# Patient Record
Sex: Female | Born: 1940 | Race: White | Hispanic: No | Marital: Married | Smoking: Former smoker
Health system: Southern US, Community
[De-identification: ages and names within clinical notes are randomized; demographics above are authoritative.]

## PROBLEM LIST (undated history)

## (undated) DIAGNOSIS — K279 Peptic ulcer, site unspecified, unspecified as acute or chronic, without hemorrhage or perforation: Secondary | ICD-10-CM

## (undated) DIAGNOSIS — E785 Hyperlipidemia, unspecified: Secondary | ICD-10-CM

## (undated) DIAGNOSIS — Z8719 Personal history of other diseases of the digestive system: Secondary | ICD-10-CM

## (undated) DIAGNOSIS — R32 Unspecified urinary incontinence: Secondary | ICD-10-CM

## (undated) DIAGNOSIS — Z8679 Personal history of other diseases of the circulatory system: Secondary | ICD-10-CM

## (undated) DIAGNOSIS — F419 Anxiety disorder, unspecified: Secondary | ICD-10-CM

## (undated) DIAGNOSIS — F32A Depression, unspecified: Secondary | ICD-10-CM

## (undated) DIAGNOSIS — T8859XA Other complications of anesthesia, initial encounter: Secondary | ICD-10-CM

## (undated) DIAGNOSIS — I272 Pulmonary hypertension, unspecified: Secondary | ICD-10-CM

## (undated) DIAGNOSIS — M1990 Unspecified osteoarthritis, unspecified site: Secondary | ICD-10-CM

## (undated) DIAGNOSIS — I10 Essential (primary) hypertension: Secondary | ICD-10-CM

## (undated) DIAGNOSIS — Z9981 Dependence on supplemental oxygen: Secondary | ICD-10-CM

## (undated) DIAGNOSIS — K219 Gastro-esophageal reflux disease without esophagitis: Secondary | ICD-10-CM

## (undated) DIAGNOSIS — I2510 Atherosclerotic heart disease of native coronary artery without angina pectoris: Secondary | ICD-10-CM

## (undated) DIAGNOSIS — T4145XA Adverse effect of unspecified anesthetic, initial encounter: Secondary | ICD-10-CM

## (undated) DIAGNOSIS — R0600 Dyspnea, unspecified: Secondary | ICD-10-CM

## (undated) DIAGNOSIS — I8390 Asymptomatic varicose veins of unspecified lower extremity: Secondary | ICD-10-CM

## (undated) DIAGNOSIS — E041 Nontoxic single thyroid nodule: Secondary | ICD-10-CM

## (undated) DIAGNOSIS — J449 Chronic obstructive pulmonary disease, unspecified: Secondary | ICD-10-CM

## (undated) DIAGNOSIS — K1121 Acute sialoadenitis: Secondary | ICD-10-CM

## (undated) DIAGNOSIS — F329 Major depressive disorder, single episode, unspecified: Secondary | ICD-10-CM

## (undated) DIAGNOSIS — G4730 Sleep apnea, unspecified: Secondary | ICD-10-CM

## (undated) DIAGNOSIS — B9620 Unspecified Escherichia coli [E. coli] as the cause of diseases classified elsewhere: Secondary | ICD-10-CM

## (undated) DIAGNOSIS — I509 Heart failure, unspecified: Secondary | ICD-10-CM

## (undated) DIAGNOSIS — M7550 Bursitis of unspecified shoulder: Secondary | ICD-10-CM

## (undated) DIAGNOSIS — R7881 Bacteremia: Secondary | ICD-10-CM

## (undated) HISTORY — DX: Hyperlipidemia, unspecified: E78.5

## (undated) HISTORY — DX: Atherosclerotic heart disease of native coronary artery without angina pectoris: I25.10

## (undated) HISTORY — PX: BILATERAL SALPINGOOPHORECTOMY: SHX1223

## (undated) HISTORY — DX: Heart failure, unspecified: I50.9

## (undated) HISTORY — DX: Peptic ulcer, site unspecified, unspecified as acute or chronic, without hemorrhage or perforation: K27.9

## (undated) HISTORY — DX: Personal history of other diseases of the circulatory system: Z86.79

## (undated) HISTORY — DX: Nontoxic single thyroid nodule: E04.1

## (undated) HISTORY — DX: Unspecified osteoarthritis, unspecified site: M19.90

## (undated) HISTORY — PX: INCONTINENCE SURGERY: SHX676

## (undated) HISTORY — DX: Chronic obstructive pulmonary disease, unspecified: J44.9

## (undated) HISTORY — DX: Pulmonary hypertension, unspecified: I27.20

## (undated) HISTORY — PX: CARDIAC CATHETERIZATION: SHX172

## (undated) HISTORY — PX: TUBAL LIGATION: SHX77

## (undated) HISTORY — DX: Unspecified urinary incontinence: R32

## (undated) HISTORY — DX: Sleep apnea, unspecified: G47.30

---

## 1988-07-25 HISTORY — PX: TOTAL ABDOMINAL HYSTERECTOMY: SHX209

## 1997-12-08 ENCOUNTER — Other Ambulatory Visit: Admission: RE | Admit: 1997-12-08 | Discharge: 1997-12-08 | Payer: Self-pay | Admitting: Obstetrics and Gynecology

## 1998-12-21 ENCOUNTER — Other Ambulatory Visit: Admission: RE | Admit: 1998-12-21 | Discharge: 1998-12-21 | Payer: Self-pay | Admitting: Obstetrics and Gynecology

## 1999-03-12 ENCOUNTER — Encounter: Payer: Self-pay | Admitting: Urology

## 1999-03-16 ENCOUNTER — Observation Stay (HOSPITAL_COMMUNITY): Admission: RE | Admit: 1999-03-16 | Discharge: 1999-03-17 | Payer: Self-pay | Admitting: Urology

## 1999-07-26 DIAGNOSIS — I2510 Atherosclerotic heart disease of native coronary artery without angina pectoris: Secondary | ICD-10-CM

## 1999-07-26 HISTORY — DX: Atherosclerotic heart disease of native coronary artery without angina pectoris: I25.10

## 2000-01-31 ENCOUNTER — Other Ambulatory Visit: Admission: RE | Admit: 2000-01-31 | Discharge: 2000-01-31 | Payer: Self-pay | Admitting: Obstetrics and Gynecology

## 2000-06-13 ENCOUNTER — Inpatient Hospital Stay (HOSPITAL_COMMUNITY): Admission: EM | Admit: 2000-06-13 | Discharge: 2000-06-29 | Payer: Self-pay | Admitting: Internal Medicine

## 2000-06-13 ENCOUNTER — Encounter: Payer: Self-pay | Admitting: Emergency Medicine

## 2000-06-15 ENCOUNTER — Encounter: Payer: Self-pay | Admitting: Interventional Cardiology

## 2000-06-21 ENCOUNTER — Encounter: Payer: Self-pay | Admitting: Thoracic Surgery (Cardiothoracic Vascular Surgery)

## 2000-06-21 HISTORY — PX: CORONARY ARTERY BYPASS GRAFT: SHX141

## 2000-06-22 ENCOUNTER — Encounter: Payer: Self-pay | Admitting: Thoracic Surgery (Cardiothoracic Vascular Surgery)

## 2000-06-23 ENCOUNTER — Encounter: Payer: Self-pay | Admitting: Thoracic Surgery (Cardiothoracic Vascular Surgery)

## 2000-06-27 ENCOUNTER — Encounter: Payer: Self-pay | Admitting: Thoracic Surgery (Cardiothoracic Vascular Surgery)

## 2000-07-14 ENCOUNTER — Encounter: Admission: RE | Admit: 2000-07-14 | Discharge: 2000-07-14 | Payer: Self-pay | Admitting: Cardiology

## 2000-07-29 ENCOUNTER — Encounter (HOSPITAL_COMMUNITY): Admission: RE | Admit: 2000-07-29 | Discharge: 2000-10-27 | Payer: Self-pay | Admitting: Interventional Cardiology

## 2000-08-14 ENCOUNTER — Encounter
Admission: RE | Admit: 2000-08-14 | Discharge: 2000-08-14 | Payer: Self-pay | Admitting: Thoracic Surgery (Cardiothoracic Vascular Surgery)

## 2000-08-14 ENCOUNTER — Encounter: Payer: Self-pay | Admitting: Thoracic Surgery (Cardiothoracic Vascular Surgery)

## 2001-03-21 ENCOUNTER — Other Ambulatory Visit: Admission: RE | Admit: 2001-03-21 | Discharge: 2001-03-21 | Payer: Self-pay | Admitting: Obstetrics and Gynecology

## 2001-08-08 ENCOUNTER — Encounter: Admission: RE | Admit: 2001-08-08 | Discharge: 2001-08-08 | Payer: Self-pay | Admitting: Family Medicine

## 2001-08-08 ENCOUNTER — Encounter: Payer: Self-pay | Admitting: Family Medicine

## 2001-08-22 ENCOUNTER — Encounter: Payer: Self-pay | Admitting: Family Medicine

## 2001-08-22 ENCOUNTER — Encounter: Admission: RE | Admit: 2001-08-22 | Discharge: 2001-08-22 | Payer: Self-pay | Admitting: Family Medicine

## 2002-01-08 ENCOUNTER — Emergency Department (HOSPITAL_COMMUNITY): Admission: EM | Admit: 2002-01-08 | Discharge: 2002-01-08 | Payer: Self-pay | Admitting: Emergency Medicine

## 2002-01-08 ENCOUNTER — Encounter: Payer: Self-pay | Admitting: Emergency Medicine

## 2002-01-18 ENCOUNTER — Encounter: Admission: RE | Admit: 2002-01-18 | Discharge: 2002-01-18 | Payer: Self-pay | Admitting: Cardiology

## 2002-03-18 ENCOUNTER — Other Ambulatory Visit: Admission: RE | Admit: 2002-03-18 | Discharge: 2002-03-18 | Payer: Self-pay | Admitting: Obstetrics and Gynecology

## 2004-01-29 ENCOUNTER — Encounter: Admission: RE | Admit: 2004-01-29 | Discharge: 2004-01-29 | Payer: Self-pay | Admitting: Cardiology

## 2004-01-30 ENCOUNTER — Ambulatory Visit (HOSPITAL_COMMUNITY): Admission: RE | Admit: 2004-01-30 | Discharge: 2004-01-30 | Payer: Self-pay | Admitting: Cardiology

## 2004-11-29 ENCOUNTER — Ambulatory Visit: Payer: Self-pay | Admitting: Internal Medicine

## 2005-01-20 ENCOUNTER — Ambulatory Visit: Payer: Self-pay | Admitting: Internal Medicine

## 2005-03-21 ENCOUNTER — Ambulatory Visit: Payer: Self-pay | Admitting: Internal Medicine

## 2005-05-20 ENCOUNTER — Ambulatory Visit: Payer: Self-pay | Admitting: Internal Medicine

## 2005-09-23 ENCOUNTER — Ambulatory Visit: Payer: Self-pay | Admitting: Internal Medicine

## 2005-10-18 ENCOUNTER — Encounter: Admission: RE | Admit: 2005-10-18 | Discharge: 2005-10-18 | Payer: Self-pay | Admitting: Family Medicine

## 2008-02-06 ENCOUNTER — Encounter: Admission: RE | Admit: 2008-02-06 | Discharge: 2008-02-06 | Payer: Self-pay | Admitting: Cardiology

## 2008-02-06 ENCOUNTER — Encounter: Payer: Self-pay | Admitting: Internal Medicine

## 2008-02-11 ENCOUNTER — Inpatient Hospital Stay (HOSPITAL_BASED_OUTPATIENT_CLINIC_OR_DEPARTMENT_OTHER): Admission: RE | Admit: 2008-02-11 | Discharge: 2008-02-11 | Payer: Self-pay | Admitting: Cardiology

## 2008-02-21 ENCOUNTER — Ambulatory Visit: Payer: Self-pay | Admitting: Internal Medicine

## 2008-02-21 DIAGNOSIS — I519 Heart disease, unspecified: Secondary | ICD-10-CM | POA: Insufficient documentation

## 2008-02-21 DIAGNOSIS — I272 Pulmonary hypertension, unspecified: Secondary | ICD-10-CM

## 2008-02-25 ENCOUNTER — Encounter: Payer: Self-pay | Admitting: Internal Medicine

## 2008-02-27 DIAGNOSIS — I2510 Atherosclerotic heart disease of native coronary artery without angina pectoris: Secondary | ICD-10-CM

## 2008-02-27 DIAGNOSIS — J449 Chronic obstructive pulmonary disease, unspecified: Secondary | ICD-10-CM

## 2008-03-03 ENCOUNTER — Telehealth: Payer: Self-pay | Admitting: Internal Medicine

## 2008-03-04 ENCOUNTER — Encounter: Payer: Self-pay | Admitting: Internal Medicine

## 2008-03-12 ENCOUNTER — Ambulatory Visit: Payer: Self-pay | Admitting: Internal Medicine

## 2008-03-14 DIAGNOSIS — R0602 Shortness of breath: Secondary | ICD-10-CM

## 2008-03-24 ENCOUNTER — Ambulatory Visit: Payer: Self-pay | Admitting: Internal Medicine

## 2008-03-27 ENCOUNTER — Encounter: Payer: Self-pay | Admitting: Internal Medicine

## 2008-04-02 ENCOUNTER — Encounter: Payer: Self-pay | Admitting: Internal Medicine

## 2008-04-03 ENCOUNTER — Encounter: Payer: Self-pay | Admitting: Internal Medicine

## 2008-04-14 ENCOUNTER — Encounter: Payer: Self-pay | Admitting: Internal Medicine

## 2008-04-23 ENCOUNTER — Ambulatory Visit: Payer: Self-pay | Admitting: Internal Medicine

## 2008-04-28 ENCOUNTER — Telehealth: Payer: Self-pay | Admitting: Internal Medicine

## 2008-04-28 DIAGNOSIS — G4733 Obstructive sleep apnea (adult) (pediatric): Secondary | ICD-10-CM

## 2008-05-05 ENCOUNTER — Telehealth (INDEPENDENT_AMBULATORY_CARE_PROVIDER_SITE_OTHER): Payer: Self-pay | Admitting: *Deleted

## 2008-07-25 HISTORY — PX: THYROIDECTOMY, PARTIAL: SHX18

## 2008-08-07 ENCOUNTER — Ambulatory Visit: Payer: Self-pay | Admitting: Internal Medicine

## 2008-10-29 ENCOUNTER — Encounter: Admission: RE | Admit: 2008-10-29 | Discharge: 2008-10-29 | Payer: Self-pay | Admitting: Family Medicine

## 2008-11-13 ENCOUNTER — Encounter (INDEPENDENT_AMBULATORY_CARE_PROVIDER_SITE_OTHER): Payer: Self-pay | Admitting: Interventional Radiology

## 2008-11-13 ENCOUNTER — Other Ambulatory Visit: Admission: RE | Admit: 2008-11-13 | Discharge: 2008-11-13 | Payer: Self-pay | Admitting: Interventional Radiology

## 2008-11-13 ENCOUNTER — Encounter: Admission: RE | Admit: 2008-11-13 | Discharge: 2008-11-13 | Payer: Self-pay | Admitting: Family Medicine

## 2009-01-22 ENCOUNTER — Encounter (INDEPENDENT_AMBULATORY_CARE_PROVIDER_SITE_OTHER): Payer: Self-pay | Admitting: Surgery

## 2009-01-22 ENCOUNTER — Ambulatory Visit (HOSPITAL_COMMUNITY): Admission: RE | Admit: 2009-01-22 | Discharge: 2009-01-23 | Payer: Self-pay | Admitting: Surgery

## 2009-04-14 ENCOUNTER — Encounter: Admission: RE | Admit: 2009-04-14 | Discharge: 2009-04-14 | Payer: Self-pay | Admitting: Family Medicine

## 2010-06-21 ENCOUNTER — Telehealth (INDEPENDENT_AMBULATORY_CARE_PROVIDER_SITE_OTHER): Payer: Self-pay | Admitting: *Deleted

## 2010-06-23 ENCOUNTER — Ambulatory Visit: Payer: Self-pay | Admitting: Internal Medicine

## 2010-06-28 ENCOUNTER — Ambulatory Visit: Payer: Self-pay | Admitting: Internal Medicine

## 2010-06-28 ENCOUNTER — Telehealth: Payer: Self-pay | Admitting: Internal Medicine

## 2010-07-02 ENCOUNTER — Telehealth (INDEPENDENT_AMBULATORY_CARE_PROVIDER_SITE_OTHER): Payer: Self-pay | Admitting: *Deleted

## 2010-07-07 ENCOUNTER — Encounter: Payer: Self-pay | Admitting: Internal Medicine

## 2010-07-29 ENCOUNTER — Ambulatory Visit
Admission: RE | Admit: 2010-07-29 | Discharge: 2010-07-29 | Payer: Self-pay | Source: Home / Self Care | Attending: Internal Medicine | Admitting: Internal Medicine

## 2010-08-24 NOTE — Progress Notes (Signed)
Summary: oxygen level  Phone Note Call from Patient Call back at Home Phone [phone]   Caller: Patient Call For: young Reason for Call: Talk to Nurse Summary of Call: Patient calling saying her oxygen level has been dropping lately.  Patient does not use oxygen during the day, only at night (2L). Requesting to see Dr. Maple Hudson. Initial call taken by: Lehman Prom,  June 21, 2010 2:02 PM  Follow-up for Phone Call        Spoke with pt and she stats she has been having increased SOb with activity x 1 month. She has not been using qvar "for a while" and states she ahd been doing fine util recently. Pt last seen 07-2008. Pt offered first available but states this is too far away and requests sooner appt. Please advise.Carron Curie CMA  June 21, 2010 3:31 PM   Additional Follow-up for Phone Call Additional follow up Details #1::        Pt to be here Wednesday morning at 850 for 9am appt.Reynaldo Minium CMA  June 21, 2010 5:02 PM

## 2010-08-24 NOTE — Progress Notes (Signed)
Summary: [facility]-[id number]  Phone Note Other Incoming Call back at [phone]   Caller: [facility] Summary of Call: [facility] requesting to speak with Katie about patient, did not give any other info. Initial call taken by: Lehman Prom,  June 28, 2010 11:22 AM  Follow-up for Phone Call         [email or web address]. Carron Curie CMA  June 28, 2010 11:32 AM  [facility] states pt has the smallest tanks they make other than going to liquid o2--which [facility] does not have, pt still requesting something smaller, so if dr Brinleigh Tew would like to provide pt with anything smaller he would need to order liquid for pt but through a diff. home care company.  Follow-up by: Philipp Deputy CMA,  June 28, 2010 12:46 PM  Additional Follow-up for Phone Call Additional follow up Details #1::        OK Additional Follow-up by: Waymon Budge MD,  July 01, 2010 1:22 PM    Additional Follow-up for Phone Call Additional follow up Details #2::    Order given to [facility] for Ronalee Red to call pt and discuss liquid oxygen by [facility]. Rhonda Cobb  July 01, 2010 2:52 PM Called [facility] and spoke with Angie to advise them that we have sent order to [facility] for them to contact pt and demo the liquid o2. Alfonso Ramus  July 01, 2010 2:54 PM

## 2010-08-24 NOTE — Assessment & Plan Note (Signed)
Summary: acute sick-SOB with activity/kcw   Copy to:  Armanda Magic Primary Provider/Referring Provider:  Maurice Small  CC:  Acute visit-low O2 levels and SOB with activity-lowest O2 level reading at home was 86%.Marland Kitchen  History of Present Illness:  04/23/08- She had bronchitis during her first attempt at overnight oximetry, and it was repeated, August 11, finding that she spent 14.3% of sleep time with a saturation 85% or less. 6 MWT 94%, 89%, 95%, walking 411 m and 6 minutes.  This indicates exercise- related oxygen desaturation. Had sleep study and cpap titration- very mild osa, qualifying for cpap based on RDI 22.9 at [facility] and titrated to cpap 10. Have tried Advair, Spiriva. PFT doesa not suggest responsiveness to bronchodilator. We started home O2 for sleep and she says this has stopped her sweating. with activity.  08/07/08- OSA , COPD, PHTN Intolerant of cpap- made her sweat and broke her face out, so she is using oxygen at night. With that she came in today on room air with sat low (87%), rebounding as she warmed to 92%.. Se feels tired and puts the oxygen on for quick relief as needed around the house.  C/O nose drips "hot water" but no headache. Takes Benadryl. Denies cough, wheeze, phlegm. Has unused prescription nasal spray at home. Says she is not smoking.  Jun 27, 2010- OSA , COPD, PHTN Nurse-CC: Acute visit-low O2 levels and SOB with activity-lowest O2 level reading at home was 86%. More aware of DOE probably since midsummer. OK at rest sitting or supine. It is a real challenge with stops to catch her breath doing shopping, folding clothes. Denies cough, wheeze, chest pain, phlegm. Feet swell some by end of day. Told by Dr Mayford Knife this year that by Nuclear Stress Test, her heart was "fine". Wears O2 at night and will sit with it as needed,. Describes her portable set-up, but says it is heavy.  Reviewed PFT 03/12/08- FVC 1.60/ 50%, FEV1 1.06/46%, FEV1/FVC 0.66, DLCO 48%.       Preventive Screening-Counseling & Management  Alcohol-Tobacco     Smoking Status: quit     Packs/Day: 1.0     Year Quit: 2010  Current Medications (verified): 1)  Lopressor 50 Mg  Tabs (Metoprolol Tartrate) .... Take 1 By Mouth Two Times A Day 2)  Furosemide 20 Mg  Tabs (Furosemide) .... Take 1 By Mouth Once Daily 3)  Zoloft 100 Mg  Tabs (Sertraline Hcl) .... Once Daily 4)  Zocor 80 Mg Tabs (Simvastatin) .... Take 1 By Mouth Once Daily 5)  Flexeril 10 Mg  Tabs (Cyclobenzaprine Hcl) .... At Bedtime 6)  Aspirin Adult Low Strength 81 Mg  Tbec (Aspirin) .... Once Daily 7)  Oxygen 2l/m Drs .... For Sleep  Allergies (verified): 1)  ! * Breathing Tx-Unsure of Name 2)  ! Codeine  Past History:  Family History: Last updated: 2010-06-27 mother died at 70 from blood anemia father died at 56 from emphysema 2 siblings in good health Daughter- has esophageal cancer  Social History: Last updated: 2010-06-27 Quit smoking  1/10 Husband bilateral leg amputee for atherosclerotic disease  Risk Factors: Smoking Status: quit (2010/06/27) Packs/Day: 1.0 (06/27/10)  Past Medical History: Coronary Heart Disease C O P D- PFT 03/12/08- FEV1 1.06/ 46%, FVC 1.60/ 50%, FEV1/FVC 0.66, DLCO 48%. 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  The patient denies shortness of breath at rest, productive cough, non-productive  cough, coughing up blood, chest pain, irregular heartbeats, acid heartburn, indigestion, loss of appetite, weight change, abdominal pain, difficulty swallowing, sore throat, tooth/dental problems, headaches, nasal congestion/difficulty breathing through nose, sneezing, itching, ear ache, rash, change in color of mucus, and fever.    Vital Signs:  Patient profile:   70 year old female Height:      67 inches Weight:      222.25 pounds BMI:     34.94 O2 Sat:      90 % on Room air Pulse rate:   87 / minute BP sitting:   118 / 68  (left arm) Cuff size:   large  Vitals Entered By: Reynaldo Minium CMA (June 23, 2010 8:58 AM)  O2 Flow:  Room air CC: Acute visit-low O2 levels and SOB with activity-lowest O2 level reading at home was 86%.   Physical Exam  Additional Exam:  General: A/Ox3; pleasant and cooperative, NAD, obese SKIN: no rash, lesions NODES: no lymphadenopathy HEENT: [place]/AT, EOM- WNL, Conjuctivae- clear, PERRLA, TM-WNL, Nose- mucosa is reddened without erosion, Throat- clear and wnl, Mallampati  III, dentures NECK: Supple w/ fair ROM, JVD- none, normal carotid impulses w/o bruits Thyroid- scar CHEST: Clear to P&A, unlabored HEART: RRR, no m/g/r heard. ? P2 a little loud at LUSB ABDOMEN:obese, nontender ZOX:WRUE, nl pulses, no edema. Has peripheral small superficial varices. NEURO: Grossly intact to observation      Impression & Recommendations:  Problem # 1:  C O P D (ICD-496) Severe COPD. PFT reviewed from 2009- FEV1 1.06/ 46%, DLCO 48%. Hypoxic respiratory failure. Her dyspnea is primarily from this. She has been noted to have some evidence ot PHTN on CT, but I don't have the cardiology measurements. It will be secondary to her COPD and she will benefit more from routine use of portable O2, than she will from additional meds to treat PHTN, I believe. We will update PFT/ 6 MWT and get reassessment of her portable device. Will retry Spiriva. She denies glaucoma.    Medications Added to Medication List This Visit: 1)  Zocor 80 Mg Tabs (Simvastatin) .... Take 1 by mouth once daily  Other Orders: Est. Patient Level IV (45409) Misc. Referral (Misc. Ref) DME Referral (DME) T-2 View CXR (71020TC)  Patient Instructions: 1)  Please schedule a follow-up appointment in 1 month. 2)  Sample Spiriva- one daily. See if it helps your shortness of breath. 3)  See [facility] to schedule PFT, and also so that we can ask [facility] for a lighter portable oxygen.

## 2010-08-24 NOTE — Assessment & Plan Note (Signed)
Summary: 6 min walk  Nurse Visit   Vital Signs:  Patient profile:   70 year old female Pulse rate:   86 / minute BP sitting:   130 / 72  Medications Prior to Update: 1)  Lopressor 50 Mg  Tabs (Metoprolol Tartrate) .... Take 1 By Mouth Two Times A Day 2)  Furosemide 20 Mg  Tabs (Furosemide) .... Take 1 By Mouth Once Daily 3)  Zoloft 100 Mg  Tabs (Sertraline Hcl) .... Once Daily 4)  Zocor 80 Mg Tabs (Simvastatin) .... Take 1 By Mouth Once Daily 5)  Flexeril 10 Mg  Tabs (Cyclobenzaprine Hcl) .... At Bedtime 6)  Aspirin Adult Low Strength 81 Mg  Tbec (Aspirin) .... Once Daily 7)  Oxygen 2l/m Drs .... For Sleep  Allergies: 1)  ! * Breathing Tx-Unsure of Name 2)  ! Codeine  Orders Added: 1)  Pulmonary Stress (6 min walk) [94620]   Six Minute Walk Test Medications taken before test(dose and time): Pt states that she did not take any meds today prior to testing.  Supplemental oxygen during the test: No  Lap counter(place a tick mark inside a square for each lap completed) lap 1 complete  lap 2 complete   lap 3 complete   lap 4 complete  lap 5 complete   Baseline  BP sitting: 130/ 72 Heart rate: 86 Dyspnea ( Borg scale) 2 Fatigue (Borg scale) 0 SPO2 92  End Of Test  BP sitting: 140/ 78 Heart rate: 107 Dyspnea ( Borg scale) 4 Fatigue (Borg scale) 5 SPO2 89  2 Minutes post  BP sitting: 134/ 72 Heart rate: 90 SPO2 92  Stopped or paused before six minutes? Yes Reason: pt paused twice for a total of 1 min- SOB / back pain.  Interpretation: Number of laps  5 X 48 meters =   240 meters+ final partial lap: 38 meters =    278 meters   Total distance walked in six minutes: 278 meters  Tech ID: Tivis Ringer, CNA (June 28, 2010 11:02 AM) Jeremy Johann Comments Pt completed test w/ 2 rest breaks and 2 complaints: SOB and back pain which pt states she has "constantly".

## 2010-08-24 NOTE — Miscellaneous (Signed)
Summary: Orders Update pft charges  Clinical Lists Changes  Orders: Added new Service order of Carbon Monoxide diffusing w/capacity (94720) - Signed Added new Service order of Lung Volumes (94240) - Signed Added new Service order of Spirometry (Pre & Post) (94060) - Signed 

## 2010-08-26 NOTE — Miscellaneous (Signed)
Summary: Best Fit Program Assessment/[facility]  Best Fit Program Assessment/[facility]   Imported By: Sherian Rein 07/20/2010 10:37:22  _____________________________________________________________________  External Attachment:    Type:   Image     Comment:   External Document

## 2010-08-26 NOTE — Progress Notes (Signed)
Summary: requesting results of PFTs  Phone Note Call from Patient   Caller: [email or web address]   Call For: young Summary of Call: pt changing dme to [facility] 02 lpm continuous needs and order fro eval for liquidm 02 portable Initial call taken by: Oneita Jolly,  July 02, 2010 11:55 AM  Follow-up for Phone Call        Dr. Maple Hudson, please advise if order may be placed as stated above, thanks! Boone Master CNA/MA  July 02, 2010 12:02 PM   Additional Follow-up for Phone Call Additional follow up Details #1::        OK Additional Follow-up by: Waymon Budge MD,  July 02, 2010 5:05 PM    Additional Follow-up for Phone Call Additional follow up Details #2::    Order faxed to [facility] to D/C and p/u tanks, concentator from pt's home on Monday 07/05/10. Order p/u by Mayra Reel to set up home o2 with [facility] at 2 lpm continuous and portable. Evaluate for light liquid portable. Called pt and she is aware of above orders. Rhonda Cobb  July 05, 2010 9:25 AM   Pt wanted to know the results of her pft. Please call pt with results. Alfonso Ramus  July 05, 2010 9:26 AM  Dr. Maple Hudson, pt requesting results of PFTs done on 06/28/10.  I do not see the results in EMR - pls advise.  Thanks! Gweneth Dimitri RN  July 05, 2010 9:43 AM    Additional Follow-up for Phone Call Additional follow up Details #3:: Details for Additional Follow-up Action Taken: PFT showed moderate slowing of airflow and moderate restricdtion of lung expansion, without response to the bronchodilator medicine. We will go over this whenshe comes for her next office appointment.   Spoke with pt and notified of results per Dr Maple Hudson.  I explained that he will discuss in further detail and answer all of her questions she may have at ov.  Pt verbalized understanding. Additional Follow-up by: Waymon Budge MD,  July 05, 2010 1:21 PM  New/Updated Medications: * OXYGEN 2L/M CONT AND PORT

## 2010-08-26 NOTE — Assessment & Plan Note (Signed)
Summary: 1 month return/mhh   Copy to:  Armanda Magic Primary Provider/Referring Provider:  Maurice Small  CC:  Follow up visit-COPD; Review and PFT results..  History of Present Illness: 08/07/08- OSA , COPD, PHTN Intolerant of cpap- made her sweat and broke her face out, so she is using oxygen at night. With that she came in today on room air with sat low (87%), rebounding as she warmed to 92%.. Se feels tired and puts the oxygen on for quick relief as needed around the house.  C/O nose drips "hot water" but no headache. Takes Benadryl. Denies cough, wheeze, phlegm. Has unused prescription nasal spray at home. Says she is not smoking.  07-06-10- OSA , COPD, PHTN Nurse-CC: Acute visit-low O2 levels and SOB with activity-lowest O2 level reading at home was 86%. More aware of DOE probably since midsummer. OK at rest sitting or supine. It is a real challenge with stops to catch her breath doing shopping, folding clothes. Denies cough, wheeze, chest pain, phlegm. Feet swell some by end of day. Told by Dr Mayford Knife this year that by Nuclear Stress Test, her heart was "fine". Wears O2 at night and will sit with it as needed,. Describes her portable set-up, but says it is heavy.  Reviewed PFT 03/12/08- FVC 1.60/ 50%, FEV1 1.06/46%, FEV1/FVC 0.66, DLCO 48%.   July 29, 2010-  OSA , COPD, PHTN Nurse-CC: Follow up visit-COPD; Review and PFT results. Denies significant issues since here in November. No routine cough or wheeze. Remains easily dyspneic with ADLs. Sleeps with O2 all night every night, but not getting out much to feels need for portable O2 and didn't bring it today. Using nasal saline for dryness.  Back pain also limits her activity. PFT- Jul 06, 2010- FVC 1.30/ 41%; FEV1 0.88/ 38% - 92%, 89%, 92% 278 meters.  CXR- 07-06-2010- moderate CE, elevated left HD.  Preventive Screening-Counseling & Management  Alcohol-Tobacco     Smoking Status: quit     Packs/Day: 1.0     Year  Quit: 2010  Current Medications (verified): 1)  Lopressor 50 Mg  Tabs (Metoprolol Tartrate) .... Take 1 By Mouth Two Times A Day 2)  Furosemide 20 Mg  Tabs (Furosemide) .... Take 1 By Mouth Once Daily 3)  Zoloft 100 Mg  Tabs (Sertraline Hcl) .... Once Daily 4)  Zocor 80 Mg Tabs (Simvastatin) .... Take 1/2 By Mouth Once Daily 5)  Flexeril 10 Mg  Tabs (Cyclobenzaprine Hcl) .... At Bedtime 6)  Aspirin Adult Low Strength 81 Mg  Tbec (Aspirin) .... Once Daily 7)  Oxygen 2l/m Cont and Port  Allergies (verified): 1)  ! * Breathing Tx-Unsure of Name 2)  ! 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                PFT12/ 05/11 - FEV1 0.88/ 38%, FVC 1.30/ 41%, FEV1/FVC  0.67, DLCO 48% PHTN- 2009: RV 61/10, PA 61/25, m 42, wedge 32 Hx Rheumatic fever/ Rheumatic heart diseaase Sleep Apnea- intol cpap  Review of Systems      See HPI       The patient complains of shortness of breath with activity.  The patient denies shortness of breath at rest, productive cough, non-productive cough, coughing up blood, chest pain, irregular heartbeats, acid heartburn, indigestion, loss of appetite, weight change, abdominal pain, difficulty swallowing, sore throat, tooth/dental problems, headaches, nasal congestion/difficulty breathing through nose, and sneezing.    Vital Signs:  Patient  profile:   70 year old female Height:      67 inches Weight:      223 pounds BMI:     35.05 O2 Sat:      91 % on Room air Pulse rate:   76 / minute BP sitting:   130 / 60  (left arm) Cuff size:   large  Vitals Entered By: Reynaldo Minium CMA (July 29, 2010 10:33 AM)  O2 Flow:  Room air CC: Follow up visit-COPD; Review and PFT results.   Physical Exam  Additional Exam:  General: A/Ox3; pleasant and cooperative, NAD, obese SKIN: no rash, lesions NODES: no lymphadenopathy HEENT: [place]/AT, EOM- WNL, Conjuctivae- clear, PERRLA, TM-WNL, Nose- mucosa is reddened without erosion, Throat- clear and wnl, Mallampati  III, dentures NECK: Supple w/ fair ROM, JVD- none, normal carotid impulses w/o bruits Thyroid- scar CHEST: Clear to P&A, unlabored, diminished but clear HEART: RRR, no m/g/r heard. ? P2 a little loud at LUSB ABDOMEN:obese, nontender ZOX:WRUE, nl pulses, no edema. Has peripheral small superficial varices. NEURO: Grossly intact to observation      Impression & Recommendations:  Problem # 1:  C O P D (ICD-496) She has restrictive and obstructive lung disease, with restriction due to obesity/ hypoventilation, cardiac enlargement and paresis of left hemidiaphragm. I am not seeing interstitial lung disaease. Without response to bronchodilator, there may not be much for medication to do. Weight loss and exercise for endurance would help. She had done cardiac rehab and I have recommended pulmonary rehab.  Spiriva trial sample made no difference.   Problem # 2:  SLEEP APNEA (ICD-780.57)  Using O2 for sleep. Weight loss would help.   Problem # 3:  PULMONARY HYPERTENSION (ICD-416.8) Oxygenation would be important here and we discussed appropriate use.  Medications Added to Medication List This Visit: 1)  Zocor 80 Mg Tabs (Simvastatin) .... Take 1/2 by mouth once daily  Other Orders: Est. Patient Level IV (45409) Rehabilitation Referral (Rehab)  Patient Instructions: 1)   Please schedule a follow-up appointment in 4 months. 2)  See [facility] about Pulmonary Rehab 3)  Continue oxygen at night and start trying to use it when you go out for long periods.

## 2010-11-01 LAB — BASIC METABOLIC PANEL
BUN: 8 mg/dL (ref 6–23)
Calcium: 9.3 mg/dL (ref 8.4–10.5)
Creatinine, Ser: 0.73 mg/dL (ref 0.4–1.2)
GFR calc Af Amer: >60 mL/min (ref >60)
GFR calc non Af Amer: >60 mL/min (ref >60)

## 2010-11-01 LAB — HEMOGLOBIN AND HEMATOCRIT, BLOOD
HCT: 39.2 % (ref 36.0–46.0)
Hemoglobin: 13.2 g/dL (ref 12.0–15.0)

## 2010-11-03 ENCOUNTER — Telehealth: Payer: Self-pay | Admitting: Internal Medicine

## 2010-11-03 DIAGNOSIS — R0602 Shortness of breath: Secondary | ICD-10-CM

## 2010-11-03 DIAGNOSIS — J449 Chronic obstructive pulmonary disease, unspecified: Secondary | ICD-10-CM

## 2010-11-03 NOTE — Telephone Encounter (Signed)
Returning call.[name] ° °

## 2010-11-03 NOTE — Telephone Encounter (Signed)
lmomtcb x1 

## 2010-11-04 NOTE — Telephone Encounter (Signed)
Addended by: Reynaldo Minium on: 11/04/2010 02:27 PM   Modules accepted: Orders

## 2010-11-04 NOTE — Telephone Encounter (Signed)
Pt states she has received her new oxygen equipment from [employer] (out of [place]) and would like for [facility] to remove their equipment from her home. Pls advise.

## 2010-11-04 NOTE — Telephone Encounter (Signed)
Order- [facility]- Ok to D/C [facility] and have them remove their oxygen equipment from home.  Need to note her new DME company for our future reference.

## 2010-11-08 ENCOUNTER — Other Ambulatory Visit (HOSPITAL_COMMUNITY): Payer: Self-pay | Admitting: Surgery

## 2010-11-08 DIAGNOSIS — E079 Disorder of thyroid, unspecified: Secondary | ICD-10-CM

## 2010-11-10 ENCOUNTER — Ambulatory Visit (HOSPITAL_COMMUNITY)
Admission: RE | Admit: 2010-11-10 | Discharge: 2010-11-10 | Disposition: A | Discharge status: Home / Self Care | Payer: Medicare Other | Source: Ambulatory Visit | Attending: Surgery | Admitting: Surgery

## 2010-11-10 DIAGNOSIS — M542 Cervicalgia: Secondary | ICD-10-CM | POA: Insufficient documentation

## 2010-11-10 DIAGNOSIS — E0789 Other specified disorders of thyroid: Secondary | ICD-10-CM | POA: Insufficient documentation

## 2010-11-10 DIAGNOSIS — E079 Disorder of thyroid, unspecified: Secondary | ICD-10-CM

## 2010-11-10 DIAGNOSIS — R220 Localized swelling, mass and lump, head: Secondary | ICD-10-CM | POA: Insufficient documentation

## 2010-11-24 ENCOUNTER — Encounter: Payer: Self-pay | Admitting: Internal Medicine

## 2010-11-26 ENCOUNTER — Ambulatory Visit (INDEPENDENT_AMBULATORY_CARE_PROVIDER_SITE_OTHER): Payer: Medicare Other | Admitting: Internal Medicine

## 2010-11-26 ENCOUNTER — Encounter: Payer: Self-pay | Admitting: Internal Medicine

## 2010-11-26 VITALS — BP 138/78 | HR 81 | Ht 67.0 in | Wt 222.0 lb

## 2010-11-26 DIAGNOSIS — I2789 Other specified pulmonary heart diseases: Secondary | ICD-10-CM

## 2010-11-26 DIAGNOSIS — G4730 Sleep apnea, unspecified: Secondary | ICD-10-CM

## 2010-11-26 DIAGNOSIS — J449 Chronic obstructive pulmonary disease, unspecified: Secondary | ICD-10-CM

## 2010-11-26 NOTE — Assessment & Plan Note (Addendum)
Pulmonary status is stable/ controlled acutely, but with expected time-related decline. We will restart O2 with [facility]. She should be using it for sleep and exertion.

## 2010-11-26 NOTE — Progress Notes (Signed)
  Subjective:    Patient ID: [name], female    DOB: 06-06-1941, 70 y.o.   MRN: [id number]  HPI 11/26/10-SATURATION QUALIFICATIONS: Patient Saturations on Room Air while Ambulating = 87% Patient Saturations on 2 Liters of oxygen while Ambulating = 96%[name],CMA   11/26/10 HPI: 23 yoF followed for COPD, sleep apnea, complicated by pulmonary hypertension. Last here July 29, 2010. PFT reviewed then- severe COPD FEV1 0.88/ 38%. Pollen season causing watery nose.  She took advice from a tv ad and changed her O2 DME to a [employer], but now wants to change back to [facility]. She continues O2 mostly at night. Wearing heart monitor because of tachypalpitation/ Dr Mayford Knife.. Back pain/ DGD limits her acitivity as much as her dyspnea.  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   Review of Systems Constitutional:   No weight loss, night sweats, Fevers, chills, fatigue, lassitude. HEENT:   No headaches,  Difficulty swallowing,  Tooth/dental problems,  Sore throat,                No sneezing, itching, ear ache, nasal congestion,  CV:  No chest pain,  Orthopnea, PND, swelling in lower extremities, anasarca, dizziness, palpitations  GI  No heartburn, indigestion, abdominal pain, nausea, vomiting, diarrhea, change in bowel habits, loss of appetite  Resp:   No excess mucus, no productive cough,  No non-productive cough,  No coughing up of blood.  No change in color of mucus.  No wheezing.  No chest wall deformity  Skin: no rash or lesions.  GU: no dysuria, change in color of urine, no urgency or frequency.  No flank pain.  MS:  No joint pain or swelling.  No decreased range of motion.  No back pain.  Psych:  No change in mood or affect. No depression or anxiety.  No memory loss.      Objective:   Physical Exam General- Alert, Oriented, Affect-appropriate, Distress- none acute  Skin- rash-none, lesions- none, excoriation- none  Lymphadenopathy- none  Head-  atraumatic  Eyes- Gross vision intact, PERRLA, conjunctivae clear secretions  Ears- Hearing, canals, Tm-  normal  Nose- Clear,No-Septal dev, mucus, polyps, erosion, perforation   Throat- Mallampati II , mucosa clear , drainage- none, tonsils- atrophic    Husky wet voice  Neck- flexible , trachea midline, no stridor , thyroid nl, carotid no bruit  Chest - symmetrical excursion , unlabored     Heart/CV- RRR , no murmur , no gallop  , no rub, nl s1 s2                           Wearing heart monitor                     - JVD- none , edema- none, stasis changes- none, varices- none     Lung- clear to P&A, diminished, unlabored,          wheeze- none, cough- none , dullness-none, rub- none     Chest wall- Abd- tender-no, distended-no, bowel sounds-present, HSM- no  Br/ Gen/ Rectal- Not done, not indicated  Extrem- cyanosis- none, clubbing, none, atrophy- none, strength- nl  Neuro- grossly intact to observation         Assessment & Plan:

## 2010-11-26 NOTE — Patient Instructions (Addendum)
We will restart your oxygen with [facility] as before. 2 L/M continuous and portable. They may be able to use the same data as before.

## 2010-11-29 ENCOUNTER — Telehealth: Payer: Self-pay | Admitting: Internal Medicine

## 2010-11-29 NOTE — Telephone Encounter (Signed)
Spoke w/ pt and she states she just wanted to let Florentina Addison know not to worry that the company in [place] was going to pick up everything they sent her. She states if I sent this to Florentina Addison she would know what she is talking about b/c it was a lot of stuff she told katie. Pt aware Florentina Addison is not in the office but will send message to her. Please advise Katie thanks  Carver Fila, CMA

## 2010-11-30 NOTE — Telephone Encounter (Signed)
Thanks for informing me of this. Pt understands if any trouble from the [facility] based company she is to call me.

## 2010-12-05 ENCOUNTER — Encounter: Payer: Self-pay | Admitting: Internal Medicine

## 2010-12-05 NOTE — Assessment & Plan Note (Signed)
Secondary to COPD and to diastolic heart failure. Treated with oxygen.

## 2010-12-07 NOTE — Cardiovascular Report (Signed)
[name], [name]                 ACCOUNT NO.:  [id number]   MEDICAL RECORD NO.:  [id number]          PATIENT TYPE:  OIB   LOCATION:  1966                         FACILITY:  [facility]   PHYSICIAN:  Armanda Magic, M.D.     DATE OF BIRTH:  May 22, 1941   DATE OF PROCEDURE:  02/11/2008  DATE OF DISCHARGE:                            CARDIAC CATHETERIZATION   REFERRING PHYSICIAN:  Gretta Arab. Valentina Lucks, MD   PROCEDURE:  Right and left heart catheterization, coronary angiography,  left ventriculography, LIMA angiography, and saphenous vein graft  angiography.   OPERATOR:  Armanda Magic, MD   INDICATIONS:  Exertional angina with shortness of breath, known coronary  disease, and pulmonary hypertension by echo.   COMPLICATIONS:  None.   IV ACCESS:  Via right femoral artery 4-French sheath and right femoral  vein 7-French sheath.   IV MEDICATIONS:  Versed 1 mg and fentanyl 25 mcg.   COMPLICATIONS:  None.   This is a 70 year old female who has a known history of tobacco abuse  with ongoing tobacco use, coronary disease status post CABG, and  pulmonary hypertension who presents with increased dyspnea and fatigue.  She now presents for cardiac catheterization to rule out progression of  coronary disease as well as to assess for pulmonary hypertension to try  to determine whether her pulmonary hypertension by echo is due primarily  to COPD or due to underlying cardiac etiology of her mitral valve or due  to diastolic dysfunction.   The patient was brought to cardiac catheterization laboratory in a  fasting nonsedated state.  Informed consent was obtained.  The patient  was connected to continuous heart rate and pulse oximetry monitoring and  blood pressure monitoring.  The right groin was prepped and draped in  sterile fashion.  Xylocaine 1% was used for local anesthesia.  Using the  modified Seldinger technique, a 4-French sheath was placed in right  femoral artery.  Using the modified  Seldinger technique, a 7-French  sheath was placed in right femoral vein.  Under fluoroscopic guidance, a  4-French JL-4 catheter was placed in the left coronary artery.  Multiple  cine films were taken at 30-degree RAO and 40-degree LAO views.  This  catheter was then exchanged out over a guidewire for a 4-French 3-D RCA  catheter which successfully engaged the right coronary ostium.  Multiple  cine films were taken at 30-degree RAO and 40-degree LAO views.  This  catheter was then exchanged out over a guidewire for a 4-French JR-4  catheter which was placed into the subclavian artery and advanced into  the ostium of the left  internal mammary artery.  Multiple cine films  were taken in the 30-degree RAO and straight lateral position.  The  catheter was then advanced into the saphenous vein graft of the obtuse  marginal graft.  Multiple cine films were taken at 30-degree RAO and 40-  degree LAO views.  This catheter was then advanced into the saphenous  vein graft to the posterior descending/posterior lateral branch.  Multiple cine films were taken at 30-degree RAO and 40-degree LAO views.  This catheter was then removed over a guidewire.  A Swan-Ganz catheter  was placed under balloon flotation into the right atrium.  Right atrial  pressure and O2 saturations were obtained.  The catheter was then  advanced into the right ventricle and right ventricular pressure was  measured.  The catheter was then advanced into the pulmonary artery and  pulmonary arterial pressure was measured as well as the pulmonary artery  O2 saturations.  The catheter was advanced into the pulmonary capillary  wedge position where pulmonary capillary wedge pressure was taken.  The  balloon was deflated.  The catheter was pulled back into the main  pulmonary artery.  Under fluoroscopic guidance, a 4-French angled  pigtail catheter was placed under fluoroscopic guidance in the left  ventricular cavity.  Left  ventricular pressure was measured and then the  Swan-Ganz catheter balloon was inflated and the catheter was placed into  the pulmonary capillary wedge position.  Pulmonary capillary wedge and  left ventricular pressure were simultaneously obtained.  The balloon was  then deflated on the Swan-Ganz catheter and pulled back into the main  pulmonary artery.  Left ventriculography was then performed in a 30-  degree RAO views using total of 25 mL of contrast at 12 mL per second.  The catheter was then pulled back across the aortic valve with no  significant gradient noted.  The catheter was removed over a guidewire.  Thermal dilutions were then performed using a total of 10 mL of saline  on each of 3 injections.  The catheter was then pulled back into the  right atrium where right atrial O2 saturation was obtained.  The  catheter was then removed under fluoroscopic guidance.  Also an O2  saturation was obtained from the femoral artery line.  At the end of the  procedure, all catheters and sheaths were removed.  Manual compression  was performed until adequate hemostasis was obtained.  The patient was  transferred back to room in stable condition.   RESULTS:  1. Left main coronary artery is widely patent and bifurcates to the      left anterior descending artery and left circumflex arteries.   The left anterior descending artery is diffusely diseased proximally at  this point up to a 30%.  There is evidence of competitive flow from the  LIMA graft to the LAD.   The left circumflex artery traverses the AV groove and is widely patent  at its proximal portion.  It gives rise to an obtuse marginal branch  which is rather large and then there is evidence of competitive flow via  saphenous vein graft to the OM.  The posterior lateral and posterior  descending arteries, the left circumflex is a left dominant system and  are occluded.   The right coronary artery is nondominant and widely  patent.   The saphenous vein graft to PD/posterior lateral branch is widely  patent.   The LIMA to the LAD is widely patent.   The saphenous vein graft to the OM is widely patent.   Right heart cath pressures:  The right atrial pressure is 18/18 with a  mean of 14 mmHg.  RV pressure 61/10 with a mean of 19 mmHg.  PA pressure  61/25 with a mean of 42 mmHg.  Pulmonary capillary wedge 32/32 with a  mean of 28 mmHg.  Pulmonary vascular resistance by Fick is 148.2 and by  thermal dilution 292.2.  Cardiac output by Fick is 7.6 and cardiac  output by thermal dilution is 3.8.  Cardiac index by Fick is 3.5 and  cardiac index by thermal dilution is 1.8.  The mitral valve gradient is  3.2 mmHg, mitral valve area by Fick was 2.72 cm2 and by thermal dilution  1.38 cm2.  Left ventricular pressure 176/70 mmHg, LVEDP 25 mmHg, aortic  pressure 173/74 mmHg.  O2 saturations:  The aortic O2 saturation was  70%, PA 51%, RA 55%.   ASSESSMENT:  1. Severe three-vessel coronary disease.  2. Patent saphenous vein graft to the posterior descending artery plus      posterior lateral artery, patent saphenous vein graft to the obtuse      marginal, patent left internal mammary artery to the left anterior      descending.  3. Normal left ventricular function, ejection fraction 60%.  4. Elevated left ventricular end-diastolic pressure consistent with      diastolic relaxation are impaired relaxation of the left ventricle.  5. Moderate pulmonary hypertension, most likely secondary to a      combination of diastolic dysfunction as well as chronic obstructive      pulmonary disease.  Also need to consider possibly obstructive      sleep apnea.  6. Normal mean mitral valve gradient.  7. Shortness of breath secondary to chronic obstructive pulmonary      disease as well as obesity and diastolic dysfunction.   PLAN:  1. I am going to add Lasix 20 mg a day given her elevated LVEDP.  2. I will refer her back to Dr.  Jetty Duhamel for further workup of      shortness of breath and her COPD which I suspect is getting worse.  3. I will order an outpatient sleep study to rule out sleep apnea.  4. She will follow up with my nurse practitioner in 2 weeks for groin      check and follow up with me in 4 weeks.      Armanda Magic, M.D.  Electronically Signed     TT/MEDQ  D:  02/11/2008  T:  02/12/2008  Job:  [id number]   cc:   Gretta Arab. Valentina Lucks, M.D.  Clinton D. Maple Hudson, MD, FCCP, FACP

## 2010-12-07 NOTE — Op Note (Signed)
[name], [name]                 ACCOUNT NO.:  [id number]   MEDICAL RECORD NO.:  [id number]          PATIENT TYPE:  OIB   LOCATION:  0098                         FACILITY:  [facility]   PHYSICIAN:  Ardeth Sportsman, MD     DATE OF BIRTH:  06/19/1941   DATE OF PROCEDURE:  01/22/2009  DATE OF DISCHARGE:                               OPERATIVE REPORT   PRIMARY CARE PHYSICIAN:  [name]. [name], M.D.   CARDIOLOGIST:  [name], M.D.   SURGEON:  Ardeth Sportsman, M.D.   ASSISTANT:  [name], M.D.   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   PROCEDURE PERFORMED:  Right thyroid lobectomy and isthmusectomy.   ANESTHESIA:  General anesthesia.   SPECIMENS:  Right thyroid gland with isthmus.  Stitches at the superior  lobe.   DRAINS:  None.   ESTIMATED BLOOD LOSS:  20 mL.   COMPLICATIONS:  None apparently.   INDICATIONS FOR PROCEDURE:  [name] is a pleasant 70 year old female  who actually had some left neck soreness after a chiropractic visit and  had a CAT scan that actually showed a calcified thyroid nodule on the  right side, but no abnormalities on the left.  The left side has since  resolved and an ultrasound-guided biopsy showed abnormal tissue  concerning for adenomatous versus neoplastic tissue of a follicular  type.  The differential diagnosis is discussed.  The anatomy and  physiology of the thyroid and parathyroid systems discussed.  Recommendations made for a right thyroid lobectomy and isthmusectomy,  with a possible need for a total thyroidectomy.  The risks, benefits and  alternatives were discussed.  The questions were answered and she agreed  to proceed.   OPERATIVE FINDING:  She had a normal-sized thyroid gland with some  nodularity within it.   The most obvious one was in the right mid-  thyroid, about 1.5 cm in size.  The left thyroid gland had a small left  upper pole thyroid nodule, less than 1 cm in size and was otherwise  unremarkable, and therefore left alone.   DESCRIPTION OF PROCEDURE:  An informed consent was obtained.  The  patient received IV gentamicin without incident.  She did have a  complaint of a little bit of itching with ciprofloxacin, but did not  have any instability and was given Benadryl and tolerated that well.  No  other antibiotics were given, since her numerous other penicillin  allergies.  Of note, she may have had some thrush in her mouth, and this  is apparently an intermittent problem, but intubation was otherwise  uneventful.  She was positioned supine with the arms tucked.  She had a  gentle shoulder roll to help elevate her chest and neck.  Her neck and  chest were prepped and draped in a sterile fashion.   Entry was gained into the neck through a collar incision about one  finger breadth above  the clavicle, going initially about 5 cm and later  extended to around 7 cm.  Cautery was used to go through some thick soft  tissues and platysma muscle.  A fixed retractor was placed, a Civil engineer, contracting was placed.  The strap muscles were split in the midline, to  help expose the thyroid gland.   Attention was turned towards the right side.  The strap muscles were  freed off the thyroid gland on the right side.  Careful dissection was  done using primarily blunt dissection, to get around the anterior  surface and come around the side.  The middle thyroid vein could be  easily isolated.  It was ligated using a clip-on neck side and  ultrasound dissection on the specimen side.   Next, attention was turned towards the inferior lobe, since that was  more mobile and obvious.  Care was made to stay extremely close to the  gland.  Some fatty tissue was peeled off the gland carefully.  There was  one area  near the inferior pole that had a good candidate for the  inferior parathyroid gland and this was kept and peeled away off the  thyroid gland carefully.  The inferior thyroid vessels are isolated and  ligated using clips and ultrasonic dissection.  We had good mobilization  of the middle and inferior part of the thyroid gland, and attention was  turned towards the superior side.  Careful dissection was done to free  the superior pole medially and then laterally, alternating using primary  blunt dissection until the obvious superior thyroid pole vessels were  isolated and skeletonized and ligated using a 2-0 silk tie on either  side, as well as a clip and ultrasonic dissection.  The superior  parathyroid gland was more densely adherent to the posterior side of the  superior pole, but this was carefully freed off.  Care was made to stay  very close on the thyroid gland and use ultrasonic dissection to avoid  cautery on the posterior side of the thyroid at all times.  Eventually,  with careful dissection, I was able to free off the right thyroid lobe.  She had a prominent pyramidal lobe that was carrying up above her  thyroid cartilage and this was carefully isolated until it just came to  a thyroid ligament going up towards her tongue, and that was carefully  isolated and transected using ultrasonic dissection.   With that, I was able to roll off the thyroid gland isthmus off the  trachea using permanent cautery, as well as focused blunt dissection,  until it came over towards the left gland.  The left gland was palpated  and a small benign-appearing nodule was noted on the left superior pole  that was under-whelming.  The rest of the gland was normal size and  unremarkable.  I therefore transected the thyroid out between the  isthmus and the left lobe, using a 3-0 silk suture ligature and  ultrasonic dissection.  The specimen was sent off.  Copious irrigation  was done and hemostasis was  assured.  Clips were intact.  Surgicel was  packed into the wound using a 2 cm x 2 cm postage stamp-type dressings.  The wound was closed using interrupted #3-0 Vicryl figure-of-eight  stitches, to reapproximate the strap muscles, sparing the distal 8 mm  for an opening.  The collar incision was reapproximated using 3-0 Vicryl  interrupted stitches at the platysma level and then a running #4-0  subcuticular  stitch as well with Monocryl and Steri-Strips.   The patient was extubated and sent to the recovery room in stable  condition.   POSTOPERATIVE CARE:  I discussed postoperative care with the patient in  detail, as well as with her family.      Ardeth Sportsman, MD  Electronically Signed     SCG/MEDQ  D:  01/22/2009  T:  01/22/2009  Job:  [id number]   cc:   [name]. [name], M.D.  Fax: [phone]   [name], M.D.  Fax: [phone]

## 2010-12-10 NOTE — Cardiovascular Report (Signed)
[place]  Patient:    LOYAL, RUDY                        MRN: [id number] Proc. Date: 06/14/00 Adm. Date:  [id number] Attending:  Rosanne Sack CC:         Darci Needle, M.D.  Salvatore Decent. Cornelius Moras, M.D.   Cardiac Catheterization  PROCEDURE:  Left heart catheterization with coronary angiography and left ventriculography.  OPERATOR:  Dr. Armanda Magic  COMPLICATIONS:  None.  IV MEDICATIONS:  None.  IV CONTRAST:  110 cc.  INDICATIONS:  This is a very pleasant white female with a past medical history significant for COPD and rheumatic fever as a child who presents for cardiac catheterization secondary to several episodes of chest pain.  The patient was brought to the cardiac catheterization laboratory in the fasting nonsedated state.  PROCEDURE NOTE:  Informed consent was obtained.  The patient was connected to continuous heart rate an and pulse oximetry monitoring and intermittent blood pressure monitoring.  The right groin was prepped and draped in the usual sterile fashion.  Then, 1% Lidocaine was used for local anesthesia.  Using the modified Seldinger technique, a 6-French sheath was placed in the right femoral artery.  Under fluoroscopic guidance, a 6-French Judkins FL-4 was placed into the left coronary artery.  Multiple cine films taken in the RAO and LAO positions. This catheter was then exchanged out over a wire for a 6-French Judkins JR-4 which was placed over the wire into the right coronary artery.  Multiple cine films taken in the RAO and LAO positions.  This catheter was then exchanged out over a wire and a 6-French angled pigtail catheter was placsed in the left ventricle.  Left ventriculography was performed in the RAO position.  The catheter was pulled across the aortic valve and there was no evidence of significant gradient.  The catheter was then removed.  The patient was given 3000 units Heparin bolus and  started on Integrilin while films were reviewed.  She was transferred back to 6500 telemetry unit until films could be further reviewed by Dr. Katrinka Blazing to decide whether intervention was necessary.  The patient tolerated the procedure without complications.  RESULTS:  LEFT MAIN:  The left main coronary artery was widely patent but heavily calcified.  It trifurcated into a left anterior descending artery, a very small high obtuse marginal 1 and ramus branch and left circumflex artery.  LEFT ANTERIOR DESCENDING:  The LAD and left circumflex were calcified proximally.  The left anterior descending had a 30% stenosis in its proximal portion and then gave rise to a large septal perforator which was widely patent.  Right after the takeoff of the septal perforator there was a 60 and in some views a 70% stenosis that was eccentric.  The LAD then gave rise to two diagonal branches which were widely patent.  The mid and distal LAD coursed to the apex and was widely patent.  The high obtuse marginal and ramus branch had a 90% stenosis proximally.  LEFT CIRCUMFLEX:  The left circumflex then gave rise to a very large obtuse marginal branch.  At the bifurcation of the ongoing left circumflex and its obtuse marginal there was a tight 80% stenosis.  There was evidence of left to right collaterals to this distal right coronary artery.  RIGHT CORONARY ARTERY:  The right coronary artery gave rise to one high right ventricular marginal branch which  was widely patent.  The right coronary artery then gave rise to a long posterior descending artery which was widely patent.  The distal right coronary artery then gave rise to two posterior lateral branches one of which that had a section that was very hazy at the the branch of two vessels.  There was very sluggish flow distally to this consistent with clot.  LEFT VENTRICULOGRAPHY:  Left ventriculography performed in the RAO position showed normal left  ventricular function with an ejection fraction of 55% and +1 MR.  There was question of mitral valve prolapse.  IMPRESSION: 1. Two vessel obstructive coronary artery disease, possibly three vessel.  In    some views the LAD looks more than 60-70%.  There is distal RCA haziness    consistent with possible clot.  There are left to right collaterals. 2. Normal left ventricular function. 3. Mild mitral regurgitation  PLAN: 1. Films will be reviewed by Dr. Katrinka Blazing and further intervention versus surgery    will be determined then. 2. Continue IV nitroglycerin drip for blood pressure control, IV heparin bolus    3000 units and Integrilin infusion. DD:  06/19/00 TD:  06/19/00 Job: [id number]

## 2010-12-10 NOTE — Consult Note (Signed)
[place]. [facility]  Patient:    [name], [name]                        MRN: [id number] Proc. Date: 06/14/00 Adm. Date:  [id number] Attending:  Rosanne Sack CC:         Darci Needle, M.D./Traci Mayford Knife, M.D.  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  HISTORY OF PRESENT ILLNESS: [name] is a 70 year old obese white female from [place], [place], with a history of hypertension, hyperlipidemia, chronic obstructive pulmonary disease, tobacco abuse, and GE reflux disease. She had no previous cardiac history other than a history of rheumatic fever as a child.  She describes a one to two year history of atypical symptoms of chest pain as well as progressive dyspnea on exertion.  On Monday, June 12, 2000, the patient developed sudden onset severe substernal chest pain occurring at rest.  These symptoms were associated with shortness of breath and radiated to the left arm and shoulder.  The pain waxed and waned all night long but persisted, prompting presentation to the emergency room yesterday. She was admitted by Dr. Jamie Brookes and seen in evaluation initially by Dr. Mayford Knife.  She has a history of chronic right bundle branch block and electrocardiograms revealed no obvious acute changes.  Admission cardiac enzymes were borderline positive, with a serum troponin measuring 0.18 with a total CK of 86 and a CK-MB fraction of 3.9.  Repeat enzymes the following morning had decreased slightly.  The patient was admitted to the hospital and placed on IV heparin as well as Integrilin continuous infusions.  She was taken for cardiac catheterization today by Dr. Mayford Knife and this demonstrates severe three vessel coronary artery disease with mild left ventricular dysfunction.  The patient is referred for possible  elective coronary artery bypass grafting.  REVIEW OF SYSTEMS: [name] describes long-standing symptoms of atypical chest pain and pain in both shoulders which she has attributed to bursitis. She also has progressive dyspnea on exertion.  She denies any episodes of resting shortness of breath with the exception of a single episode of acute paroxysmal nocturnal dyspnea at some point several months previously.  She denies any history of orthopnea.  She has mild bilateral lower extremity edema which develops by the end of a long day at work.  She denies any symptoms of palpitations or syncope, although she has had some dizzy spells.  She denies any symptoms of claudication.  She reports no recent change in bowel function and denies any history of hematochezia, hematemesis, or melena.  She reports no chronic cough.  She denies hemoptysis.  She reports no fever or chills.  PAST MEDICAL HISTORY:  1. Hypertension, which has been poorly controlled.  2. Long-standing tobacco abuse.  3. Chronic obstructive pulmonary disease.  4. Hyperlipidemia.  5. Gastroesophageal reflux disease.  6. Hiatal hernia.  7. History of positive Helicobacter pylori antibodies with questionable     history of peptic ulcer disease.  8. History of rheumatic fever in the remote past, with previous normal     echocardiogram.  9. Anxiety and depression. 10. Obesity. 11. Bilateral shoulder bursitis. 12. History of urinary incontinence, status post bladder suspension. 13. Previous hysterectomy.  [name] denies any history of diabetes mellitus or previous stroke.  FAMILY HISTORY: Notable that [name] sister suffered a heart attack at  the age of 46.  [name] family history is otherwise negative for the presence of coronary artery disease.  SOCIAL HISTORY: The patient lives with [name] husband here in [place], [place] and works full-time at [employer].  She has smoked approximately one packs of cigarettes per  day for more than 38 years.  She denies any history of heavy alcohol consumption.  MEDICATIONS PRIOR TO ADMISSION:  1. Zoloft.  2. Norvasc.  3. Prilosec.  4. Lipitor.  5. Celebrex.  6. Climara.  ALLERGIES:  1. The patient reports drug sensitivity to CODEINE.  2. The patient reports drug sensitivity to ALBUTEROL.  PHYSICAL EXAMINATION:  GENERAL: The patient is a well-appearing, moderately obese white female who appears [name] stated age and is in no acute distress.  VITAL SIGNS: She is afebrile.  SKIN: Clean and dry throughout.  HEENT: Examination essentially within normal limits.  NECK: Supple.  No jugular venous distention.  No carotid bruits are identified.  CHEST: Auscultation of the chest demonstrates clear and symmetrical breath sounds bilaterally.  No wheezes or rhonchi are noted.  CARDIOVASCULAR: Examination demonstrates regular rate and rhythm.  No murmurs, rubs, or gallops are appreciated.  ABDOMEN: The abdomen is moderately obese, soft, and nontender.  No masses are identified.  The liver edge could not be appreciated.  EXTREMITIES: Warm and well perfused.  Distal pulses are palpable in both lower legs though somewhat thready.  There is no lower extremity edema.  There is no venous insufficiency.  NEUROLOGIC: Examination is grossly nonfocal and symmetrical throughout.  RECTAL/GU: Examinations are deferred.  DIAGNOSTIC TESTS: Cardiac catheterization film performed earlier today is reviewed.  This demonstrates severe three vessel coronary artery disease with mild left ventricular dysfunction.  Specifically, there is a 70% proximal stenosis of the left anterior descending coronary artery with 80% proximal stenosis of a large first circumflex marginal branch.  There is 50% proximal stenosis of the right coronary artery and 90% distal stenosis of the right coronary artery with haziness suggestive of acute thrombus.  The distal right  coronary artery beyond the  bifurcation may not be graftable in the AV groove. No posterolateral branches off the distal right coronary artery are visualized.  Overall ejection fraction is estimated at 55% with mild mitral regurgitation.  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  I believe that Ms. Quraishi would best be treated with elective coronary artery bypass grafting.  [name] risks of surgery are slightly increased related to co-morbid conditions, but I do not believe she has any contraindication to proceeding with surgery.  I have outlined at length the indications, risks, and potential benefits of coronary artery bypass grafting with Ms. Wysocki here in [name] hospital room this evening.  At this point she is not ready to make a final decision regarding plans for possible surgery versus alternative treatment.  We will continue to follow the patient along and make a final decision as appropriate.  All of [name] questions have been addressed. DD:  06/14/00 TD:  06/16/00 Job: [id number]

## 2010-12-10 NOTE — Consult Note (Signed)
[place]. [facility]  Patient:    PAULETT, KAUFHOLD                        MRN: [id number] Proc. Date: 06/13/00 Adm. Date:  [id number] Attending:  Rosanne Sack CC:         Redmond Baseman, M.D.  Darci Needle, M.D.   Consultation Report  CHIEF COMPLAINT:  Chest pain.  REFERRING PHYSICIAN:  Rosanne Sack, M.D.  HISTORY OF PRESENT ILLNESS:  This is a 70 year old white female with multiple cardiac risk factors including family history, hypertension, tobacco abuse (1 pack per day x 38 years), hyperlipidemia, postmenopausal on hormone replacement therapy, and obesity and was admitted to the emergency room after prolonged episodes of substernal chest pain.  Yesterday she went to work and then [employer].  After coming home she was bringing in groceries and had a sudden onset of severe substernal chest pain across her chest with radiation to the left arm.  She denied any shortness of breath, diaphoresis, nausea, vomiting or dizziness.  Her pain eventually subsided but then reoccurred when she went to bed.  Her pain was intermittent throughout the night but resolved in the morning.  Currently she is pain free.  Initial EKG showed chronic right bundle-branch block with questionable inferior wall MI.  PAST MEDICAL HISTORY:  Significant for:  1. Uncontrolled hypertension.  2. Tobacco abuse 1 pack per day for 38 years with chronic obstructive     pulmonary disease with a history of chronic obstructive pulmonary disease     exacerbation in April of 1998.  3. Hyperlipidemia.  4. Postmenopausal on hormone replacement therapy.  5. Hiatal hernia with gastroesophageal reflux disease by upper GI series in     March of 1998.  6. History of positive H. pylori antibodies and a questionable history of     peptic ulcer disease with no previous history of GI bleed.  7. History of rheumatic fever x 3, last at age 74, with a normal 2-D  echocardiogram echocardiogram in 1992.  8. Anxiety and depression.  9. Obesity. 10. History of bilateral shoulder bursitis. 11. Chronic left bundle-branch block. 12. History of urinary incontinence status post bladder tacking. 13. Status post hysterectomy.  ALLERGIES:  CODEINE.  CURRENT MEDICATIONS:  1. Zoloft 100 mg q. day.  2. Norvasc 5 mg a day.  3. Prilosec 20 mg a day.  4. Lipitor 20 mg a day.  5. Celebrex 200 mg a day.  6. Albuterol MDI p.r.n.  7. Climara patch twice a week.  FAMILY HISTORY:  Significant for an acute myocardial infarction in her sister at age of 70 years of age.  Her brother has hypertension.  She has no diabetes mellitus, ______ or stroke in the family.  SOCIAL HISTORY:  The patient is married and has four grown children.  She smokes 1 pack per day for the past 38 years.  She denies any alcohol or illegal drug use.  She works at [employer].  REVIEW OF SYSTEMS:  As per history of present illness.  She has no focal weakness, no syncope, no palpitations.  She denies any abdominal pain, melena, bright red blood per rectum, weight loss or night sweats.  She does occasionally have coughing episodes and dyspnea.  PHYSICAL EXAMINATION:  VITAL SIGNS:  Her blood pressure is 133/60 with a heart rate of 70 beats per minute.  GENERAL:  This is a well-developed well-nourished white female in  no acute distress.  HEENT:  Extraocular muscles intact bilaterally.  No scleral icterus.  Full lid movement.  Mouth:  Oral mucosa is moist and pink.  NECK:  Supple without lymphadenopathy, no masses.  No thyromegaly.  LUNGS:  Diffuse wheezes throughout anteriorly and posteriorly.  HEART:  Regular rate and rhythm, no murmurs, rubs or gallops.  Normal S1, S2. Carotid upstrokes plus 2 bilaterally, no bruits.  ABDOMEN:  Soft, nontender, nondistended with active bowel sounds, no hepatosplenomegaly.  No pulsatile masses.  No abdominal bruits.  EXTREMITIES:  No cyanosis,  erythema, or edema.  Plus 2 dorsalis pedis pulses bilaterally.  LABORATORY DATA:  Shows normal electrolytes, troponins are 0.15 and 0.18.  CPK 74, MB 2.6..  EKG shows normal sinus rhythm at a rate of 70 beats per minutes.  There is right bundle-branch block present.  There is questionable inferior wall myocardial infarction, age undetermined.  IMPRESSION:  1. CHEST PAIN WITH CHRONIC RIGHT BUNDLE BRANCH BLOCK WITH QUESTION OF     INFERIOR WALL MI, QUESTIONABLE AGE.  Troponin is positive initially with     negative CPKs.  The patient has not been feeling well for 2 weeks and this     may represent an MI earlier in the week, now with post infarction angina     which would explain the elevated troponin but negative CPK.  This could     also represent a falsely elevated troponin and elements of recent infarction.  She does have multiple cardiac risk factors including     obesity, significant tobacco abuse, and hypertension which is     uncontrolled.  Therefore I recommend cardiac catheterization to further     evaluate coronary anatomy.  2. HYPERTENSION, not well controlled at present.  3. TOBACCO ABUSE.  4. HYPERLIPIDEMIA.  5. COPD.  PLAN:  1. Cardiac catheterization in the morning.  2. Discontinue Lovenox and start IV heparin.  3. Discontinue Integrilin unless patient has recurrent chest pain.  4. Continue IV nitroglycerin, aspirin and oxygen.  5. No beta blockers secondary to COPD with active wheezing on exam.  6. ACE inhibitor for blood pressure as well as nitroglycerin drip for     aggressive blood pressure management.  7. Patient needs counseling on smoking cessation.  8. aggressive management of hyperlipidemia.  This patient was seen in consultation with Dr. Garnette Scheuermann. DD:  06/14/00 TD:  06/15/00 Job: [id number]

## 2010-12-10 NOTE — Op Note (Signed)
[place]. [facility]  Patient:    [name], [name]                        MRN: [id number] Proc. Date: 06/21/00 Adm. Date:  [id number] Attending:  Tressie Stalker CC:         Celso Sickle, M.D.  Armanda Magic, M.D. - & Rosanne Sack, M.D.  Redmond Baseman, M.D.   Operative Report  PREOPERATIVE DIAGNOSIS:  Severe three-vessel coronary artery disease with class 4 unstable angina.  POSTOPERATIVE DIAGNOSIS:  Severe three-vessel coronary artery disease with class 4 unstable angina.  PROCEDURE:  Median sternotomy for coronary artery bypass grafting x 4 (left internal mammary artery to the distal left anterior descending coronary artery, saphenous vein graft to the second circumflex marginal branch, saphenous vein graft to the posterior descending coronary artery, and sequential saphenous vein graft to the right posterolateral branch).  SURGEON:  Salvatore Decent. Cornelius Moras, M.D.  ASSISTANT:  Lissa Merlin, P.A.-C.  ANESTHESIA:  General.  INDICATIONS:  The patient is a 70 year old white female followed by Dr. Redmond Baseman, and referred by Dr. Darci Needle III and Dr. Armanda Magic, for the management of coronary artery disease.  The patient has no previous cardiac history, but has risk factors including hypertension, hyperlipidemia, tobacco abuse, and a strong family history of coronary artery disease.  She is quite obese.  She has chronic restrictive lung disease due to a combination of chronic obstructive pulmonary disease, as well as restrictive disease related to her obesity.  She presents with symptoms consistent with class 1 unstable angina and borderline positive cardiac enzymes.  The patient was initially admitted to the hospital under the care of Dr. Nolon Rod Monguilod and was seen in consultation by Dr. Mayford Knife.  A cardiac catheterization performed by Dr. Mayford Knife confirms the presence of severe three-vessel coronary artery  disease with preserved left ventricular function.  OPERATIVE CONSENT:  The patient and her family are counseled at length regarding the indications and potential benefits of coronary artery bypass grafting.  They understand that there are somewhat elevated risks associated with surgery due to the patients comorbid conditions, particularly her chronic lung disease.  They accept all associated risks of surgery, and desire to proceed as described.  DESCRIPTION OF PROCEDURE:  The patient is brought to the operating room on the above-mentioned date, and invasive hemodynamic monitoring is established by the anesthesia service, under the care and direction of Dr. Guadalupe Maple. The patient was placed in the supine position on the operating room table. Intravenous antibiotics are administered.  The patients chest, abdomen, both groins, and both lower extremities are prepared and draped in a sterile manner, following induction with general endotracheal anesthesia.  A median sternotomy incision is performed, and the left internal mammary artery is dissected from the chest wall and prepared for bypass grafting.  The left internal mammary artery is a good quality conduit for bypass grafting. Simultaneously the saphenous vein is obtained from the patients right thigh and the upper portion of the right lower leg, through a series of longitudinal incisions.  The saphenous vein is a good quality conduit for bypass grafting. The patient is heparinized systemically.  The pericardium is opened.  The ascending aorta is palpated.  There are some calcifications and plaque appreciated in that transverse arch, and in the aorta just beyond the takeoff of the innominate artery; however, the proximal portion of the ascending aorta feels normal.  The ascending aorta and the right atrial appendage are cannulated without difficulty.  Adequate heparinization is verified.  Cardiopulmonary bypass is begun, and the  surface of the heart is inspected. Distal sites are selected for coronary bypass grafting.  Portions of the saphenous vein and the left internal mammary artery are trimmed to appropriate lengths.  A temperature probe is placed in the left ventricular septum, and a Styrofoam pad is placed to protect the left phrenic nerve from thermal injury. A cardioplegic catheter is placed in the ascending aorta.  The patient is cooled to 32 degrees systemic temperature.  The aortic crossclamp is applied and cardioplegia is delivered in an antegrade fashion through the aortic root.  Iced saline slush is applied for topical hypothermia.  The initial cardiac arrest is rapid and myocardial cooling is felt to be satisfactory.  There is some dilatation of the left ventricular chamber consistent with mild aortic insufficiency, which requires intermittent cessation of cardioplegia; however, adequate diastolic arrest and myocardial cooling to below 12 degrees centigrade is achieved.  Repeat doses of cardioplegia are administered intermittently throughout the main portion of the operation, both through the aortic root and down the subsequently placed vein grafts to maintain septal temperature below 15 degrees centigrade.  The following distal coronary anastomoses are performed: 1. The posterior descending coronary artery is graft with a saphenous vein    graft in a side-to-side fashion using running #7-0 Prolene suture.    This coronary measures 1.2 mm in diameter and is of fair to poor    quality.  It is occluded proximally. 2. The posterior lateral branch off the distal right coronary artery is    grafted with a sequential saphenous vein graft using the vein placed    to the posterior descending coronary artery.  This coronary measures    1.1 mm in diameter and is of fair quality at best.  It is occluded    proximally. 3. The second circumflex marginal branch is grafted with a saphenous vein     graft in an  end-to-side fashion using running #7-0 Prolene suture.    This coronary measures 1.4 mm in diameter and is of fair quality.    There is diffuse plaque throughout this vessel, and finding an appropriate    site for distal anastomosis is somewhat challenging.  Ultimately the    anastomosis is performed uneventfully. 4. Distal left anterior descending coronary artery is grafted with the    left internal mammary artery using running #8-0 Prolene suture.  This    coronary is also diffusely diseased, and finding an appropriate site    for grafting is quite difficult.  There is plaque located throughout    this entire vessel, and there are no areas which are normal in appearance.    The anastomosis was performed uneventfully.  The coronary measures    approximately 1.5 mm in diameter, but only a 1.0 mm probe will pass in    either direction, due to the native disease within the vessel.  A 1.0 mm    probe will pass all the way to the apex of the heart.  After the completion    of the left internal mammary artery anastomosis, the septal temperature is    noted to rise rapidly and dramatically upon reperfusion.  Both proximal    saphenous vein anastomoses are performed directly to the ascending aorta    prior to the removal of the aortic cross clamp.  All air is evacuated  from the aortic root, and the aortic cross clamp is removed after a total    cross clamp time of 73 minutes.  The heart begins to beat spontaneously without need for cardioversion.  All proximal and distal anastomoses are inspected for hemostasis, and appropriate graft orientation.  Epicardial pacing wires are _______ throughout the ventricular outflow tract into the right atrial appendage.  The patient is rewarmed to greater than 37 degrees centigrade temperature.  The patient is weaned from cardiopulmonary bypass without difficulty.  The patients rhythm at separation from bypass is normal sinus rhythm with first-degree AV  block.  No inotropic support is required.  Total cardiopulmonary bypass time for the operation is 107 minutes.  The venous and arterial cannulae are removed uneventfully.  Protamine is administered to reverse the anticoagulation.  The mediastinum and the left chest are drained with three chest tubes placed through separate stab incisions inferiorly.  The median sternotomy is closed in a routine fashion. The deep subcutaneous tissues of the right thigh are drained with a 19-French Blake drain.  The right thigh and right lower leg incisions are closed in multiple layers in a routine fashion.  The sternal skin incision is closed with a subcuticular skin closure, whereas the right lower extremity incisions are closed with skin staples.  The patient tolerated the procedure well and was transported to the surgical intensive care unit in stable condition.  There were no intraoperative complications.  All sponge, instrument, and needle counts were verified as correct at the completion of the operation.  No autologous blood products were administered. DD:  06/21/00 TD:  06/21/00 Job: [id number]

## 2010-12-10 NOTE — Cardiovascular Report (Signed)
NAME:  [name], [name]                           ACCOUNT NO.:  [id number]   MEDICAL RECORD NO.:  [id number]                   PATIENT TYPE:  OIB   LOCATION:  2854                                 FACILITY:  [facility]   PHYSICIAN:  Armanda Magic, M.D.                  DATE OF BIRTH:  February 15, 1941   DATE OF PROCEDURE:  01/30/2004  DATE OF DISCHARGE:                              CARDIAC CATHETERIZATION   CARDIOLOGY CONSULT   PROCEDURES:  1. Left heart catheterization.  2. Coronary angiography.  3. Left ventriculography.   OPERATOR CARDIOLOGIST:  Armanda Magic, M.D.   INDICATIONS:  Chest pain, coronary disease.   COMPLICATIONS:  None.   ACCESS:  IV access via right femoral artery, 6 French sheath.   DETAILS:  This is a 70 year old white female who has a history of 3-vessel  coronary disease status post coronary artery bypass grafting with a LIMA to  the LAD, saphenous vein graft sequentially to the PDA and posterolateral  branch and saphenous vein graft to the OM-2.  She now presents with an  abnormal Cardiolite with mild apical ischemia and recurrent chest pain.   The patient was brought to the cardiac catheterization laboratory in the  fasting, nonsedated state. Informed consent was obtained.  The patient was  connected to continuous heart rate and pulse oximetry monitoring and  intermittent blood pressure monitoring. The right groin was prepped and  draped in a sterile fashion. Xylocaine 1% was used for local anesthesia.  Using modified Seldinger technique, a 6 French sheath was placed in the  right femoral artery.   Under fluoroscopic guidance a JL4 catheter was placed in the left coronary  artery. Multiple cine films were taken in the 30-degree RAO and 40-degree  LAO views. This catheter was then exchanged out over a guidewire for a 6  [place] JR4 catheter, which was placed under fluoroscopic guidance in the  right coronary artery. After injection of the right coronary artery the  catheter was pulled back and engaged in the saphenous vein graft to the  posterolateral posterior descending artery; and multiple cine films were  taken in 30-degree RAO and 40-degree LAO views.   The catheter was then pulled back and placed in the vein graft to the OM-2  and multiple cine films were taken in the 30-degree RAO and 40-degree LAO  views.  The catheter was then pulled back into the aortic arch and allowed  to be guided into the left subclavian artery and then into the left internal  mammary artery.  Multiple cine films were taken in the 30-degree RAO and 40-  degree LAO views.  The catheter was then removed.   LEFT VENTRICULOGRAM:  Catheter was then exchanged out over a guidewire for a  6 French angled pigtail catheter, which was placed under fluoroscopic  guidance in the left ventricular cavity.  Left ventriculography was  performed at a 30-degree  RAO view using a total of 30 cc of contrast at 15  cc/sec. The catheter was then pulled back across the aortic valve with no  significant gradient.   RESULTS:  1. The left main coronary artery is widely patent.  It bifurcates into a     left anterior descending artery and left circumflex artery.  2. The left circumflex artery gives rise to a very small obtuse marginal 1     branch which is patent.  The obtuse marginal 2 is a large caliber vessel     that is occluded proximally, the ongoing circumflex traverses the AV     groove.  3. The left anterior descending artery had luminal irregularity and gives     rise to 2 diagonal branches both of which are widely patent.  Distal to     the second diagonal branch there is evidence of competitive flow into a     LIMA graft.  There are luminal irregularities in the LAD.  4. The right coronary artery is widely patent in the proximal-to-mid     portions giving rise to a high acute marginal branch which is widely     patent.  There is a low acute marginal branch which is rather large  that     supplies the inferior wall.  The distal RCA is occluded.  5. The saphenous vein graft to the posterior lateral and PDA is widely     patent, and the posterolateral and PDA beyond the graft insertion is     widely patent.  6. The saphenous vein graft to the OM-2 is widely patent and completely     fills the OM-2 which is a bifurcating vessel and is widely patent beyond     the graft insertion.  7. The LIMA to the LAD appears somewhat atretic which, I think, is probably     because the native LAD has fairly good flow and the distal LAD is     somewhat small.  8. Left ventriculography shows a 9 mm mmHg, aortic pressure 164/73 mmHg.  On     pullback the aortic pressure was 126/63 mmHg and LV pressure 136/10 mmHg.   ASSESSMENT:  1. Coronary disease status post coronary artery bypass grafting.  2. Three-vessel coronary disease; saphenous vein graft to the OM-2 is     patent.  Saphenous vein graft to the posterolateral PDA is patent.  LIMA     to the LAD is patent but somewhat atretic with evidence of competitive     flow in the LAD.  3. Normal LV function.  4. Noncardiac chest pain.   PLAN:  Discharge to home after IV fluid and bed rest.  Follow up with me in  2 weeks and continue current medications.                                               Armanda Magic, M.D.    TT/MEDQ  D:  01/30/2004  T:  01/31/2004  Job:  [id number]

## 2010-12-10 NOTE — Discharge Summary (Signed)
[place]. [facility]  Patient:    [name], [name]                        MRN: [id number] Adm. Date:  [id number] Disc. Date: 06/27/00 Attending:  Tressie Stalker Dictator:   Maxwell Marion, RNFA CC:         Redmond Baseman, M.D.  Darci Needle, M.D.  Armanda Magic, M.D.   Discharge Summary  DATE OF BIRTH:  02/13/1941  DATE OF SURGERY:  June 21, 2000  ADMISSION DIAGNOSIS:  Chest pain, rule out myocardial infarction, possible unstable angina.  PAST MEDICAL HISTORY:  1. Hypertension.  2. Tobacco use, one pack a day x 38 years.  3. COPD.  4. Hyperlipidemia.  5. She is postmenopausal, is on hormone replacement treatment.  6. Hiatal hernia/GERD.  7. Questionable history of peptic ulcer disease.  8. History of rheumatic fever, normal echo in 1992.  9. Anxiety and depression. 10. Obesity. 11. History of bilateral shoulder bursitis. 12. Chronic right bundle branch block. 13. History of urinary incontinence, status post bladder tacking. 14. Status post hysterectomy.  ALLERGIES:  The patient is allergic to CODEINE.  DISCHARGE DIAGNOSES:  1. Three-vessel coronary artery disease, status post coronary artery bypass     grafting.  2. Preoperative Escherichia coli urinary tract infection.  3. Postoperative anemia, transfusion November 30 and December 1.  HISTORY OF PRESENT ILLNESS:  On November 20, this 70 year old woman presents to [facility] ER with complaints of substernal chest pain with duration overnight, not related to exertion.  She does report shortness of breath.  No nausea, vomiting, or diaphoresis.  The pain does radiate to her left arm and shoulder.  She was seen by Dr. Jamie Brookes in the emergency room, was started on nitroglycerin, Lovenox, beta-blocker, aspirin, oxygen, and Integrilin.  HOSPITAL COURSE:  She was seen in consultation by cardiology who recommended cardiac catheterization.  On November 21, cardiac  catheterization was performed with revealed three-vessel coronary artery disease with normal LV function, mild MR/MVP.  Cardiac surgery consult was obtained with Dr. Cornelius Moras who recommended coronary artery bypass grafting if her pulmonary status was acceptable.  The patient was also considering her options at this time.  On November 22, pulmonary consult was obtained.  November 3, Doppler study was performed which revealed in the right carotid 40-60% ICA stenosis, on the left 60-80% ICA stenosis, and she was noted to have bilateral ABIs slightly below normal at 0.80.  On November 23, she underwent a 2-D echocardiogram which revealed overall normal LV function with ejection fraction of approximately 55-65%, mild mitral valve regurgitation.  On November 23, she also underwent pulmonary function tests which revealed abnormal lung functions, but they were not prohibitive dysfunctions.  All of these test results were discussed by Dr. Cornelius Moras with the patient and the family.  They decided on surgery, and surgery was tentatively scheduled for November 27.  This was ultimately changed to November 28, at which time she underwent a coronary artery bypass grafting x 4 by Dr. Tressie Stalker.  Grafts placed at the time of procedure were the left internal mammary artery to the LAD, saphenous vein was grafted to the second OM, saphenous vein was grafted in a sequential fashion to the PD/PL.  At conclusion of the procedure she was transferred in stable condition to the SICU.  [name] postoperative course has been remarkable only for anemia which has required two transfusions, her  hemoglobin and hematocrit are improving, and she will continue to take iron and folic acid after discharge. She has also had extended oxygen dependence especially with ambulation.  [name] has made steady progress since her surgery.  Repeat CBC, BMET, and chest x-ray to be checked tomorrow morning.  If these results are all  acceptable and she is ready to go home, Dr. Cornelius Moras anticipates her ready for discharge tomorrow, December 4.  MEDICATIONS ON DISCHARGE: 1. Enteric-coated aspirin 325 mg q.d. 2. Ultram 50 mg one to two p.o. q.4-6h. p.r.n. pain. 3. Lopressor 25 mg b.i.d. 4. Niferex 150 mg q.d. 5. Folic acid 1 mg q.d.  She is also instructed to resume her home medications of: 1. Zoloft 100 mg q.d. 2. Prilosec 20 mg q.d. 3. Lipitor 20 mg q.d. 4. Albuterol MDI p.r.n. 5. Vivelle HRT patch.  DISCHARGE FOLLOWUP:  [name] has been instructed to obtain an appointment to see Dr. Verdis Prime in approximately two weeks, and she also has an appointment to see Dr. Cornelius Moras at the [facility] office on December 31 at 9:15.  She has been instructed to get a chest x-ray taken at [facility] approximately one hour prior to this appointment. DD:  06/26/00 TD:  06/26/00 Job: [id number]

## 2010-12-10 NOTE — H&P (Signed)
[place]. [facility]  Patient:    [name], [name]                        MRN: [id number] Adm. Date:  [id number] Disc. Date: [id number] Attending:  Tressie Stalker CC:         Redmond Baseman, M.D.             Celso Sickle, M.D.             Armanda Magic, M.D.                         History and Physical  DATE OF BIRTH:  [birth year]  PROBLEM:  1. Chest pain, rule out myocardial infarction, possible unstable angina.  2. Uncontrolled hypertension.  3. Tobacco abuse, one pack per day x 38 years:     a. Chronic obstructive pulmonary disease with history of COPD        exacerbation in April 1998.  4. Hyperlipidemia.  5. Postmenopausal, on hormonal replacement therapy.  6. Hiatal hernia and GERD by upper GI series in March 1998.  7. History of positive H. pylori antibodies, and questionable history     of peptic ulcer disease, followed by treatment in the past:     a. No previous history of GI bleed.  8. History of rheumatic fever x 3, last at age 48:     a. Normal 2-D echocardiogram in 1992.  9. Anxiety and depression. 10. Obesity. 11. History of bilateral shoulder bursitis. 12. Chronic right bundle branch block. 13. History of urinary incontinence, status post bladder tack. 14. Status post hysterectomy. 15. Allergy to CODEINE.  CHEST:  Chest pain.  HISTORY OF PRESENT ILLNESS:  Monica. Lloyd is a 70 year old female who presents to the emergency room with substernal chest pain that started last night at about 10 p.m.  The patient states that these chest symptoms were not related to exertion.  She complains of shortness of breath associated with the chest tightness.  No nausea, no vomiting, no diaphoresis.  The chest tightness is radiated to the left arm and shoulder, associated with numbness of the extremity.  The patient describes how the symptoms went on pretty much all night long, with a crescendo and decrescendo pattern overnight.  No  previous history of angina or dyspnea on exertion.  No previous history of myocardial infarction or congestive heart failure.  The patient has a sedentary life style.  She denies cough, fever, chills, rhinorrhea, postnasal drip, orthopnea, PND, and no recent known trips.  No recent leg pain or unusual lower extremity swelling.  PAST MEDICAL HISTORY:  As in the problem list.  ALLERGIES:  CODEINE.  CURRENT MEDICATIONS: 1. Zoloft 100 mg p.o. q.d. 2. Norvasc 5 mg p.o. q.d. 3. Prilosec 20 mg p.o. q.d. 4. Lipitor 20 mg p.o. q.d. 5. Celebrex 200 mg q.d. 6. Albuterol MDI p.r.n. 7. Climara patch twice a week.  FAMILY HISTORY:  Significant for an acute myocardial infarction in her sister at the age of 29 years old.  Her brother has hypertension.  No diabetes mellitus, malignancy, or stroke in the family.  SOCIAL HISTORY:  The patient is married and has four grown children.  She smokes as described in the problem list.  No alcohol or illegal drug use.  She works in [employer].  REVIEW OF SYSTEMS:  As in the HPI.  No focal weakness.  No syncope.  No tachy palpitations.  No melena, tarry stools, bright red blood per rectum, weight loss, or night sweats.  PHYSICAL EXAMINATION:  VITAL SIGNS:  Temperature 9.9 degrees, heart rate 92, respirations 20, blood pressure 175/98.  Oxygen saturation 98% on room air.  HEENT:  Normocephalic, atraumatic.  Anicteric sclerae.  Conjunctivae within normal limits.  PERRLA.  EOMI.  Funduscopic examination negative for papilledema or hemorrhages.  Tympanic membranes within normal limits.  Moist mucous membranes.  Oropharynx clear.  NECK:  Supple, no jugular venous distention, no bruits, no adenopathy, no thyromegaly.  LUNGS:  Clear to auscultation bilaterally, without crackles or wheezes.  Fair air movement bilaterally.  CARDIAC:  A regular rate and rhythm with a questionable 1/6 systolic ejection murmur at the left lower sternal border.  No rubs, no S3,  no S4.  ABDOMEN:  Mildly obese, nontender, nondistended.  Bowel sounds present.  No hepatosplenomegaly.  No rebound, no guarding, no masses, no bruits.  GENITOURINARY:  Within normal limits.  BREASTS:  Within normal limits.  RECTAL:  Not done.  EXTREMITIES:  Trace edema bilaterally around the ankles and distal tibial. Pulses 1+ bilaterally.  No clubbing or cyanosis.  NEUROLOGIC:  Alert and oriented x 3.  Strength 5/5 in all extremities.  Deep tendon reflexes 3/5 in all extremities.  Cranial nerves II-XII intact. Plantar reflexes downgoing bilaterally.  Sensory intact.  LABORATORY DATA:  Electrocardiogram:  Consistent with a right bundle branch block, unchanged since August of this year.  Chest x-ray consistent with COPD changes, though no infiltrates, no edema.  Troponin I 0.18, CPK/MB 86/3.9.  Sodium 142, potassium 4.3, chloride 106, CO2 of 31, BUN 12, creatinine 0.7, glucose 94.  Liver function tests within normal limits.  CBC within normal limits, hemoglobin 13.9, MCV 86.  ASSESSMENT/PLAN: 1. Chest pain, rule out myocardial infarction, possible unstable angina.  PLAN:  Given the history of presentation associated with multiple cardiac risk factors, unstable angina is possible.  The first set of cardiac enzymes has some ________ of values in the troponin I, with normal CPK-MB.  The electrocardiogram shows persistent right bundle branch block.  Normally the patient is chest pain-free.  There is no evidence of hypoxemia that could be consistent with pulmonary emboli.  The patient states that the chest symptoms are completely different from the symptoms that she felt in the past with peptic ulcer disease and reflux.  No evidence of gastrointestinal bleed clinically, with a normal hemoglobin.  A chest x-ray showed no infiltrates and no bronchitis.  The lung examination is quite benign.  Musculoskeletal source or other etiologies like gallbladder disease are less likely though  still  possible.  Will go ahead and admit the patient to the step-down unit.  The patient is being transferred to [facility] due to the cardiac etiology concerns.  Will start nitroglycerin drip, Lovenox, a beta blocker, aspirin, oxygen, and Integrilin drip.  An ACE inhibitor also will be used for blood pressure control.  I discussed the case with Dr. Darci Needle III, who is aware of this case.  Dr. Armanda Magic will see the patient in consultation today.  It is possible that this patient will be cardiac catheterized tomorrow morning.  Cardiac enzymes and electrocardiogram series will be obtained.  2. Uncontrolled hypertension.  PLAN:  For now will hold Norvasc, since the beta blocker, ACE inhibitors, and nitrates are being used.  Will follow the patients blood pressure and heart rate response to these new medications.  3. Tobacco abuse.  PLAN:  I spent about 15 minutes talking about the importance of smoking cessation.  For now the patient declined a nicotine patch.  4. Hyperlipidemia.  PLAN:  Will continue with the statins during this hospital stay, in light of the cardiac workup, with chest pain.  5. Postmenopausal.  PLAN:  The patient could be continued on hormone replacement therapy upon discharge, despite the recent data that shows no benefits from using hormonal replacement therapy as a primary or secondary prevention of coronary artery disease.  Nevertheless, other symptoms related with postmenopausal state can be proved with hormonal replacement therapy, as well as protect this patient from osteoporosis.  6. Gastroesophageal reflux disease/history of peptic ulcer disease.  PLAN:  Currently the patient denies any symptoms consistent with acute GI bleed.  As described in problem number one, peptic ulcer disease can present with the same symptoms.  For now we will continue with the ACE inhibitors and follow clinically. DD:  06/13/00 TD:   06/13/00 Job: [id number]

## 2011-02-03 ENCOUNTER — Telehealth: Payer: Self-pay | Admitting: Internal Medicine

## 2011-02-03 NOTE — Telephone Encounter (Signed)
Spoke with pt. She states no longer uses neb meds per CDY, but has continued to get albuterol and neb equipment in the mail from [facility]. She states that she has already informed them to stop sending, and so now she is just wanting to let [facility] know to stop signing the orders when they fax. I informed the pt that if she has told them she no longer takes med, they should not send [place] anything else to sign. She still wants [facility]Y to know this so I will forward to him as FYI.

## 2011-02-22 NOTE — Telephone Encounter (Signed)
noted 

## 2011-03-04 ENCOUNTER — Other Ambulatory Visit: Payer: Self-pay | Admitting: Neurosurgery

## 2011-03-04 DIAGNOSIS — M545 Low back pain, unspecified: Secondary | ICD-10-CM

## 2011-03-11 ENCOUNTER — Ambulatory Visit
Admission: RE | Admit: 2011-03-11 | Discharge: 2011-03-11 | Disposition: A | Discharge status: Home / Self Care | Payer: Medicare Other | Source: Ambulatory Visit | Attending: Neurosurgery | Admitting: Neurosurgery

## 2011-03-11 DIAGNOSIS — M545 Low back pain: Secondary | ICD-10-CM

## 2011-04-01 ENCOUNTER — Ambulatory Visit (INDEPENDENT_AMBULATORY_CARE_PROVIDER_SITE_OTHER): Payer: Medicare Other | Admitting: Internal Medicine

## 2011-04-01 ENCOUNTER — Encounter: Payer: Self-pay | Admitting: Internal Medicine

## 2011-04-01 VITALS — BP 142/78 | HR 81 | Ht 67.0 in | Wt 217.8 lb

## 2011-04-01 DIAGNOSIS — J449 Chronic obstructive pulmonary disease, unspecified: Secondary | ICD-10-CM

## 2011-04-01 DIAGNOSIS — Z23 Encounter for immunization: Secondary | ICD-10-CM

## 2011-04-01 DIAGNOSIS — G4730 Sleep apnea, unspecified: Secondary | ICD-10-CM

## 2011-04-01 NOTE — Assessment & Plan Note (Signed)
Encouraged to continue O2 for sleep and explained cardiac benefit. Weight loss .

## 2011-04-01 NOTE — Patient Instructions (Signed)
Flu vax  Sample trial Symbicort 160       2 puffs and rinse mouth, twice daily    If it helps shortness of breath, we can send a prescription  Continue O2 at 2 L/M for sleep and as needed.

## 2011-04-01 NOTE — Progress Notes (Signed)
Subjective:    Patient ID: [name], female    DOB: October 26, 1940, 70 y.o.   MRN: [id number]  HPI    Review of Systems     Objective:   Physical Exam        Assessment & Plan:   Subjective:    Patient ID: [name], female    DOB: 10-30-1940, 70 y.o.   MRN: [id number]  HPI 11/26/10-SATURATION QUALIFICATIONS: Patient Saturations on Room Air while Ambulating = 87% Patient Saturations on 2 Liters of oxygen while Ambulating = 96%[name],CMA   11/26/10 HPI: 55 yoF followed for COPD, sleep apnea, complicated by pulmonary hypertension. Last here July 29, 2010. PFT reviewed then- severe COPD FEV1 0.88/ 38%. Pollen season causing watery nose.  She took advice from a tv ad and changed her O2 DME to a [employer], but now wants to change back to [facility]. She continues O2 mostly at night. Wearing heart monitor because of tachypalpitation/ Dr Mayford Knife.. Back pain/ DGD limits her acitivity as much as her dyspnea.  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   04/01/11-  70 yoF followed for COPD, sleep apnea, complicated by pulmonary hypertension. Heart monitor results ok- Dr Mayford Knife- for tachypalpitation. Back pain was slowing her down. She got first epidural steroid injection this week.  Dr Michaell Cowing was following for THYROID nodule with hx partial thyroidectomy.  Flu vax talk.  She stays in when hot outdoors, rather than bothering with her portable oxygen. Continues O2 2L [facility] for sleep. Feels she is breathing some better lately, not worse. Says CPAP broke her face out- failed to tolerate.    Review of Systems Constitutional:   No weight loss, night sweats, Fevers, chills, fatigue, lassitude. HEENT:   No headaches,  Difficulty swallowing,  Tooth/dental problems,  Sore throat,                No sneezing, itching, ear ache, nasal congestion, CV:  No chest pain,  Orthopnea, PND, swelling in lower extremities, anasarca, dizziness, palpitations GI  No heartburn, indigestion,  abdominal pain, nausea, vomiting, diarrhea, change in bowel habits, loss of appetite Resp:   No excess mucus, no productive cough,  No non-productive cough,  No coughing up of blood.  No change in color of mucus.  No wheezing.   Skin: no rash or lesions. GU: no dysuria, change in color of urine, no urgency or frequency.  No flank pain. MS:  No joint pain or swelling.  No decreased range of motion.  No back pain. Psych:  No change in mood or affect. No depression or anxiety.  No memory loss.      Objective:   Physical Exam General- Alert, Oriented, Affect-appropriate, Distress- none acute   obese Skin- rash-none, lesions- none, excoriation- none Lymphadenopathy- none Head- atraumatic            Eyes- Gross vision intact, PERRLA, conjunctivae clear secretions            Ears- Hearing, canals normal            Nose- Clear, No-septal dev, mucus, polyps, erosion, perforation             Throat- Mallampati III , mucosa clear/ dry , drainage- none, tonsils- atrophic Neck- flexible , trachea midline, no stridor , thyroid nl, carotid no bruit Chest - symmetrical excursion , unlabored           Heart/CV- RRR , no murmur , no gallop  , no rub,  nl s1 s2                           - JVD- none , edema- none, stasis changes- none, varices- none           Lung- clear to P&A, wheeze- none, cough- none , dullness-none, rub- none           Chest wall-  Abd- tender-no, distended-no, bowel sounds-present, HSM- no Br/ Gen/ Rectal- Not done, not indicated Extrem- cyanosis- none, clubbing, none, atrophy- none, strength- nl Neuro- grossly intact to observation          Assessment & Plan:

## 2011-04-01 NOTE — Assessment & Plan Note (Signed)
Meds unchanged/ To continue oxygen for sleep. Mouth is dry- consider Spiriva re- trial later.  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

## 2011-04-22 LAB — POCT I-STAT 3, VENOUS BLOOD GAS (G3P V)
Acid-Base Excess: 3 — ABNORMAL HIGH
O2 Saturation: 55
Operator id: 141321
pCO2, Ven: 63.8 — ABNORMAL HIGH
pH, Ven: 7.316 — ABNORMAL HIGH
pH, Ven: 7.334 — ABNORMAL HIGH

## 2011-04-22 LAB — POCT I-STAT 3, ART BLOOD GAS (G3+)
Acid-Base Excess: 4 — ABNORMAL HIGH
O2 Saturation: 79
TCO2: 32

## 2011-08-01 ENCOUNTER — Ambulatory Visit: Payer: Medicare Other | Admitting: Internal Medicine

## 2011-10-31 ENCOUNTER — Other Ambulatory Visit: Payer: Self-pay | Admitting: Family Medicine

## 2011-10-31 ENCOUNTER — Ambulatory Visit
Admission: RE | Admit: 2011-10-31 | Discharge: 2011-10-31 | Disposition: A | Discharge status: Home / Self Care | Payer: Medicare Other | Source: Ambulatory Visit | Attending: Family Medicine | Admitting: Family Medicine

## 2011-10-31 DIAGNOSIS — J449 Chronic obstructive pulmonary disease, unspecified: Secondary | ICD-10-CM

## 2011-10-31 DIAGNOSIS — R0902 Hypoxemia: Secondary | ICD-10-CM

## 2011-10-31 DIAGNOSIS — R059 Cough, unspecified: Secondary | ICD-10-CM

## 2011-10-31 DIAGNOSIS — R05 Cough: Secondary | ICD-10-CM

## 2011-11-02 ENCOUNTER — Encounter (HOSPITAL_COMMUNITY): Payer: Self-pay | Admitting: Emergency Medicine

## 2011-11-02 ENCOUNTER — Telehealth: Payer: Self-pay | Admitting: Critical Care Medicine

## 2011-11-02 ENCOUNTER — Emergency Department (HOSPITAL_COMMUNITY): Payer: Medicare Other

## 2011-11-02 ENCOUNTER — Inpatient Hospital Stay (HOSPITAL_COMMUNITY)
Admission: EM | Admit: 2011-11-02 | Discharge: 2011-11-06 | DRG: 189 | Disposition: A | Discharge status: Home / Self Care | Payer: Medicare Other | Attending: Internal Medicine | Admitting: Internal Medicine

## 2011-11-02 DIAGNOSIS — I509 Heart failure, unspecified: Secondary | ICD-10-CM | POA: Diagnosis present

## 2011-11-02 DIAGNOSIS — I2510 Atherosclerotic heart disease of native coronary artery without angina pectoris: Secondary | ICD-10-CM | POA: Diagnosis present

## 2011-11-02 DIAGNOSIS — J449 Chronic obstructive pulmonary disease, unspecified: Secondary | ICD-10-CM | POA: Diagnosis present

## 2011-11-02 DIAGNOSIS — J9600 Acute respiratory failure, unspecified whether with hypoxia or hypercapnia: Principal | ICD-10-CM | POA: Diagnosis present

## 2011-11-02 DIAGNOSIS — F329 Major depressive disorder, single episode, unspecified: Secondary | ICD-10-CM | POA: Diagnosis present

## 2011-11-02 DIAGNOSIS — I2789 Other specified pulmonary heart diseases: Secondary | ICD-10-CM | POA: Diagnosis present

## 2011-11-02 DIAGNOSIS — Z87891 Personal history of nicotine dependence: Secondary | ICD-10-CM

## 2011-11-02 DIAGNOSIS — F3289 Other specified depressive episodes: Secondary | ICD-10-CM | POA: Diagnosis present

## 2011-11-02 DIAGNOSIS — I5033 Acute on chronic diastolic (congestive) heart failure: Secondary | ICD-10-CM | POA: Diagnosis present

## 2011-11-02 DIAGNOSIS — M1990 Unspecified osteoarthritis, unspecified site: Secondary | ICD-10-CM | POA: Diagnosis present

## 2011-11-02 DIAGNOSIS — J441 Chronic obstructive pulmonary disease with (acute) exacerbation: Secondary | ICD-10-CM | POA: Diagnosis present

## 2011-11-02 DIAGNOSIS — Z9981 Dependence on supplemental oxygen: Secondary | ICD-10-CM

## 2011-11-02 DIAGNOSIS — J9612 Chronic respiratory failure with hypercapnia: Secondary | ICD-10-CM | POA: Diagnosis present

## 2011-11-02 DIAGNOSIS — I519 Heart disease, unspecified: Secondary | ICD-10-CM | POA: Diagnosis present

## 2011-11-02 DIAGNOSIS — Z951 Presence of aortocoronary bypass graft: Secondary | ICD-10-CM

## 2011-11-02 DIAGNOSIS — Z66 Do not resuscitate: Secondary | ICD-10-CM | POA: Diagnosis present

## 2011-11-02 DIAGNOSIS — F411 Generalized anxiety disorder: Secondary | ICD-10-CM | POA: Diagnosis present

## 2011-11-02 HISTORY — DX: Personal history of other diseases of the digestive system: Z87.19

## 2011-11-02 HISTORY — DX: Gastro-esophageal reflux disease without esophagitis: K21.9

## 2011-11-02 HISTORY — DX: Asymptomatic varicose veins of unspecified lower extremity: I83.90

## 2011-11-02 HISTORY — DX: Major depressive disorder, single episode, unspecified: F32.9

## 2011-11-02 HISTORY — DX: Anxiety disorder, unspecified: F41.9

## 2011-11-02 HISTORY — DX: Bursitis of unspecified shoulder: M75.50

## 2011-11-02 HISTORY — DX: Depression, unspecified: F32.A

## 2011-11-02 HISTORY — DX: Essential (primary) hypertension: I10

## 2011-11-02 LAB — BLOOD GAS, ARTERIAL
Acid-Base Excess: 5 mmol/L — ABNORMAL HIGH (ref 0.0–2.0)
Drawn by: 257701
O2 Content: 2 L/min
O2 Saturation: 94.1 %
pO2, Arterial: 69.4 mmHg — ABNORMAL LOW (ref 80.0–100.0)

## 2011-11-02 LAB — BASIC METABOLIC PANEL
CO2: 32 mEq/L (ref 19–32)
Calcium: 9.2 mg/dL (ref 8.4–10.5)
Creatinine, Ser: 0.54 mg/dL (ref 0.50–1.10)
GFR calc non Af Amer: >90 mL/min (ref >90)

## 2011-11-02 LAB — CBC
HCT: 41.4 % (ref 36.0–46.0)
MCH: 29 pg (ref 26.0–34.0)
MCHC: 32 g/dL (ref 30.0–36.0)
MCV: 91.4 fL (ref 78.0–100.0)
Platelets: 204 10*3/uL (ref 150–400)
Platelets: 194 10*3/uL (ref 150–400)
RBC: 4.53 MIL/uL (ref 3.87–5.11)
RDW: 12.5 % (ref 11.5–15.5)
RDW: 12.6 % (ref 11.5–15.5)
WBC: 9.4 10*3/uL (ref 4.0–10.5)

## 2011-11-02 LAB — POCT I-STAT TROPONIN I

## 2011-11-02 LAB — CREATININE, SERUM
Creatinine, Ser: 0.63 mg/dL (ref 0.50–1.10)
GFR calc non Af Amer: 87 mL/min — ABNORMAL LOW (ref >90)

## 2011-11-02 LAB — DIFFERENTIAL
Basophils Absolute: 0 10*3/uL (ref 0.0–0.1)
Basophils Relative: 0 % (ref 0–1)
Eosinophils Absolute: 0.1 10*3/uL (ref 0.0–0.7)
Eosinophils Relative: 1 % (ref 0–5)
Monocytes Absolute: 0.5 10*3/uL (ref 0.1–1.0)

## 2011-11-02 LAB — CARDIAC PANEL(CRET KIN+CKTOT+MB+TROPI)
Relative Index: 0.4 (ref 0.0–2.5)
Troponin I: <0.3 ng/mL (ref <0.30)

## 2011-11-02 LAB — PRO B NATRIURETIC PEPTIDE: Pro B Natriuretic peptide (BNP): 609.1 pg/mL — ABNORMAL HIGH (ref 0–125)

## 2011-11-02 MED ORDER — IPRATROPIUM BROMIDE 0.02 % IN SOLN
0.5000 mg | RESPIRATORY_TRACT | Status: DC | PRN
  Filled 2011-11-02 (×4): qty 2.5

## 2011-11-02 MED ORDER — ALBUTEROL SULFATE (5 MG/ML) 0.5% IN NEBU
2.5000 mg | INHALATION_SOLUTION | RESPIRATORY_TRACT | Status: AC
  Administered 2011-11-02: 2.5 mg via RESPIRATORY_TRACT
  Filled 2011-11-02: qty 0.5

## 2011-11-02 MED ORDER — ALBUTEROL (5 MG/ML) CONTINUOUS INHALATION SOLN
10.0000 mg/h | INHALATION_SOLUTION | Freq: Once | RESPIRATORY_TRACT | Status: AC
  Administered 2011-11-02: 10 mg/h via RESPIRATORY_TRACT

## 2011-11-02 MED ORDER — SERTRALINE HCL 100 MG PO TABS
100.0000 mg | ORAL_TABLET | Freq: Every day | ORAL | Status: DC
  Administered 2011-11-03 – 2011-11-06 (×4): 100 mg via ORAL
  Filled 2011-11-02 (×4): qty 1

## 2011-11-02 MED ORDER — IPRATROPIUM BROMIDE 0.02 % IN SOLN
0.5000 mg | RESPIRATORY_TRACT | Status: DC
  Administered 2011-11-02 – 2011-11-03 (×2): 0.5 mg via RESPIRATORY_TRACT
  Filled 2011-11-02 (×2): qty 2.5

## 2011-11-02 MED ORDER — ASPIRIN EC 81 MG PO TBEC
81.0000 mg | DELAYED_RELEASE_TABLET | Freq: Every day | ORAL | Status: DC
  Administered 2011-11-02 – 2011-11-06 (×5): 81 mg via ORAL
  Filled 2011-11-02 (×6): qty 1

## 2011-11-02 MED ORDER — CYCLOBENZAPRINE HCL 10 MG PO TABS
10.0000 mg | ORAL_TABLET | Freq: Every day | ORAL | Status: DC
  Administered 2011-11-02 – 2011-11-05 (×4): 10 mg via ORAL
  Filled 2011-11-02 (×5): qty 1

## 2011-11-02 MED ORDER — ATORVASTATIN CALCIUM 40 MG PO TABS
40.0000 mg | ORAL_TABLET | Freq: Every day | ORAL | Status: DC
  Administered 2011-11-02 – 2011-11-05 (×4): 40 mg via ORAL
  Filled 2011-11-02 (×5): qty 1

## 2011-11-02 MED ORDER — METOPROLOL TARTRATE 50 MG PO TABS
50.0000 mg | ORAL_TABLET | Freq: Two times a day (BID) | ORAL | Status: DC
  Administered 2011-11-02 – 2011-11-06 (×8): 50 mg via ORAL
  Filled 2011-11-02 (×9): qty 1

## 2011-11-02 MED ORDER — ALBUTEROL SULFATE (5 MG/ML) 0.5% IN NEBU
INHALATION_SOLUTION | RESPIRATORY_TRACT | Status: AC
  Administered 2011-11-02: 10 mg/h via RESPIRATORY_TRACT
  Filled 2011-11-02: qty 2

## 2011-11-02 MED ORDER — ONDANSETRON HCL 4 MG/2ML IJ SOLN: 4.0000 mg | Freq: Four times a day (QID) | INTRAMUSCULAR | Status: DC | PRN

## 2011-11-02 MED ORDER — SODIUM CHLORIDE 0.9 % IV SOLN: 250.0000 mL | INTRAVENOUS | Status: DC | PRN

## 2011-11-02 MED ORDER — SODIUM CHLORIDE 0.9 % IJ SOLN
3.0000 mL | Freq: Two times a day (BID) | INTRAMUSCULAR | Status: DC
  Administered 2011-11-02 – 2011-11-06 (×8): 3 mL via INTRAVENOUS

## 2011-11-02 MED ORDER — LEVOFLOXACIN IN D5W 500 MG/100ML IV SOLN
500.0000 mg | INTRAVENOUS | Status: DC
  Administered 2011-11-02: 500 mg via INTRAVENOUS
  Filled 2011-11-02 (×2): qty 100

## 2011-11-02 MED ORDER — ONDANSETRON HCL 4 MG PO TABS: 4.0000 mg | ORAL_TABLET | Freq: Four times a day (QID) | ORAL | Status: DC | PRN

## 2011-11-02 MED ORDER — ENOXAPARIN SODIUM 30 MG/0.3ML ~~LOC~~ SOLN
30.0000 mg | SUBCUTANEOUS | Status: DC
  Administered 2011-11-02: 30 mg via SUBCUTANEOUS
  Filled 2011-11-02 (×2): qty 0.3

## 2011-11-02 MED ORDER — ACETAMINOPHEN 650 MG RE SUPP: 650.0000 mg | Freq: Four times a day (QID) | RECTAL | Status: DC | PRN

## 2011-11-02 MED ORDER — FUROSEMIDE 10 MG/ML IJ SOLN
40.0000 mg | Freq: Three times a day (TID) | INTRAMUSCULAR | Status: DC
  Administered 2011-11-02 – 2011-11-04 (×5): 40 mg via INTRAVENOUS
  Filled 2011-11-02 (×8): qty 4

## 2011-11-02 MED ORDER — IPRATROPIUM BROMIDE 0.02 % IN SOLN: 0.5000 mg | Freq: Four times a day (QID) | RESPIRATORY_TRACT | Status: DC

## 2011-11-02 MED ORDER — ACETAMINOPHEN 325 MG PO TABS: 650.0000 mg | ORAL_TABLET | Freq: Four times a day (QID) | ORAL | Status: DC | PRN

## 2011-11-02 MED ORDER — FUROSEMIDE 10 MG/ML IJ SOLN
40.0000 mg | Freq: Once | INTRAMUSCULAR | Status: AC
  Administered 2011-11-02: 40 mg via INTRAVENOUS
  Filled 2011-11-02: qty 4

## 2011-11-02 MED ORDER — METHYLPREDNISOLONE SODIUM SUCC 125 MG IJ SOLR
80.0000 mg | Freq: Four times a day (QID) | INTRAMUSCULAR | Status: DC
  Administered 2011-11-02 – 2011-11-03 (×3): 80 mg via INTRAVENOUS
  Administered 2011-11-03: 22:00:00 via INTRAVENOUS
  Administered 2011-11-03 – 2011-11-05 (×7): 80 mg via INTRAVENOUS
  Filled 2011-11-02 (×11): qty 1.28
  Filled 2011-11-02: qty 4
  Filled 2011-11-02: qty 2
  Filled 2011-11-02: qty 1.28

## 2011-11-02 MED ORDER — SODIUM CHLORIDE 0.9 % IJ SOLN: 3.0000 mL | INTRAMUSCULAR | Status: DC | PRN

## 2011-11-02 NOTE — H&P (Addendum)
PCP:   Astrid Divine, MD, MD   Cardiologist : Dr. Carolanne Grumbling Pulmonologist : Dr. Jetty Duhamel  Chief Complaint:  Shortness of breath  HPI: [name] is a 71 year old female with history of chronic respiratory failure secondary to COPD/diastolic CHF on 2 L home O2 at baseline presents to the ER today with increasing shortness of breath, cough, fevers and chills since Friday. Patient reports that her symptoms started with cough congestion and shortness of breath on Friday which gradually worsened, subsequently went inside out within treated her with antibiotics and a nebulizer treatment. Despite this her symptoms progressed to worsening cough and shortness of breath now associated with PND and orthopnea. She also reports that her cough is productive with thick yellow expectorant and wheezing. Upon evaluation the ER her x-ray was consistent with mild pulmonary edema and [facility] were consulted for further evaluation and management   Allergies:   Allergies  Allergen Reactions  . Codeine   . Ventolin (ZDG:[id number])     "jittery"      Past Medical History  Diagnosis Date  . Coronary heart disease   . COPD (chronic obstructive pulmonary disease)   . Pulmonary HTN   . H/O: rheumatic fever   . History of rheumatic heart disease     X 3-LAST TIME WHEN PATIENT WAS 71 YRS OLD  . Sleep apnea   . Hypertension   . H/O right bundle branch block   . Bursitis, shoulder     bilateral  . GERD (gastroesophageal reflux disease)   . H/O hiatal hernia   . Anxiety   . Depression   . H/O urinary incontinence   . Varicose veins     Past Surgical History  Procedure Date  . Bilateral salpingoophorectomy   . Vaginal delivery     x4  . Thyroidectomy, partial 2010    Dr Michaell Cowing  . Tubal ligation   . Total abdominal hysterectomy     1990  . Coronary artery bypass graft 06/21/2000    x4  . Cardiac catheterization 02/11/2008, 01/30/2004, 06/14/2000  . Bladder tack      Prior to Admission medications   Medication Sig Start Date End Date Taking? Authorizing Provider  aspirin 81 MG tablet Take 81 mg by mouth daily.     Yes Historical Provider, MD  cyclobenzaprine (FLEXERIL) 10 MG tablet Take 10 mg by mouth at bedtime.     Yes Historical Provider, MD  furosemide (LASIX) 20 MG tablet Take 20 mg by mouth daily.     Yes Historical Provider, MD  metoprolol (LOPRESSOR) 50 MG tablet Take 50 mg by mouth 2 (two) times daily.     Yes Historical Provider, MD  sertraline (ZOLOFT) 100 MG tablet Take 100 mg by mouth daily.     Yes Historical Provider, MD  simvastatin (ZOCOR) 80 MG tablet 1/2 tab by mouth at bedtime    Yes Historical Provider, MD    Social History: married lives at home with her husband denies any alcohol abuse   reports that she quit smoking about 4 years ago. Her smoking use included Cigarettes. She has a 47 pack-year smoking history. She has never used smokeless tobacco. She reports that she does not drink alcohol or use illicit drugs.  Family History  Problem Relation Age of Onset  . Anemia Mother   . Emphysema Father   . Esophageal cancer Daughter     Review of Systems:  positives bolded  Constitutional: Denies fever, chills, diaphoresis, appetite change and  fatigue.  HEENT: Denies photophobia, eye pain, redness, hearing loss, ear pain, congestion, sore throat, rhinorrhea, sneezing, mouth sores, trouble swallowing, neck pain, neck stiffness and tinnitus.   Respiratory:  SOB, DOE, cough, chest tightness,  and wheezing.   Cardiovascular: Denies chest pain, palpitations and leg swelling.  Gastrointestinal: Denies nausea, vomiting, abdominal pain, diarrhea, constipation, blood in stool and abdominal distention.  Genitourinary: Denies dysuria, urgency, frequency, hematuria, flank pain and difficulty urinating.  Musculoskeletal: Denies myalgias, back pain, joint swelling, arthralgias and gait problem.  Skin: Denies pallor, rash and wound.   Neurological: Denies dizziness, seizures, syncope, weakness, light-headedness, numbness and headaches.  Hematological: Denies adenopathy. Easy bruising, personal or family bleeding history  Psychiatric/Behavioral: Denies suicidal ideation, mood changes, confusion, nervousness, sleep disturbance and agitation   Physical Exam: Blood pressure 151/56, pulse 102, temperature 98.9 F (37.2 C), resp. rate 24, SpO2 94.00%.  this is an elderly white female sitting in stretcher in mild distress, not using accessory muscles of respiration and able to speak in full sentences slight HEENT pupils 4 mm right to light oral mucosa is moist neck no JVD or lymphadenopathy Lungs poor air movement bilaterally,  crackles at  both bases   abdomen soft nontender normal bowel sounds no organomegaly Extremities trace edema no clubbing or cyanosis 2+ peripheral pulses  Neuro moves all extremities no localizing signs   Labs on Admission:  Results for orders placed during the hospital encounter of 11/02/11 (from the past 48 hour(s))  CBC     Status: Normal   Collection Time   11/02/11  8:50 AM      Component Value Range Comment   WBC 9.4  4.0 - 10.5 (K/uL)    RBC 4.53  3.87 - 5.11 (MIL/uL)    Hemoglobin 12.9  12.0 - 15.0 (g/dL)    HCT 96.0  45.4 - 09.8 (%)    MCV 91.4  78.0 - 100.0 (fL)    MCH 28.5  26.0 - 34.0 (pg)    MCHC 31.2  30.0 - 36.0 (g/dL)    RDW 11.9  14.7 - 82.9 (%)    Platelets 204  150 - 400 (K/uL)   DIFFERENTIAL     Status: Abnormal   Collection Time   11/02/11  8:50 AM      Component Value Range Comment   Neutrophils Relative 83 (*) 43 - 77 (%)    Neutro Abs 7.8 (*) 1.7 - 7.7 (K/uL)    Lymphocytes Relative 11 (*) 12 - 46 (%)    Lymphs Abs 1.0  0.7 - 4.0 (K/uL)    Monocytes Relative 6  3 - 12 (%)    Monocytes Absolute 0.5  0.1 - 1.0 (K/uL)    Eosinophils Relative 1  0 - 5 (%)    Eosinophils Absolute 0.1  0.0 - 0.7 (K/uL)    Basophils Relative 0  0 - 1 (%)    Basophils Absolute 0.0  0.0 -  0.1 (K/uL)   BASIC METABOLIC PANEL     Status: Abnormal   Collection Time   11/02/11  8:50 AM      Component Value Range Comment   Sodium 137  135 - 145 (mEq/L)    Potassium 3.5  3.5 - 5.1 (mEq/L)    Chloride 96  96 - 112 (mEq/L)    CO2 32  19 - 32 (mEq/L)    Glucose, Bld 141 (*) 70 - 99 (mg/dL)    BUN 10  6 - 23 (mg/dL)  Creatinine, Ser 0.54  0.50 - 1.10 (mg/dL)    Calcium 9.2  8.4 - 10.5 (mg/dL)    GFR calc non Af Amer >90  >90 (mL/min)    GFR calc Af Amer >90  >90 (mL/min)   POCT I-STAT TROPONIN I     Status: Normal   Collection Time   11/02/11  8:55 AM      Component Value Range Comment   Troponin i, poc 0.01  0.00 - 0.08 (ng/mL)    Comment 3            BLOOD GAS, ARTERIAL     Status: Abnormal   Collection Time   11/02/11 10:01 AM      Component Value Range Comment   O2 Content 2.0      Delivery systems NASAL CANNULA      pH, Arterial 7.392  7.350 - 7.400     pCO2 arterial 51.4 (*) 35.0 - 45.0 (mmHg)    pO2, Arterial 69.4 (*) 80.0 - 100.0 (mmHg)    Bicarbonate 30.6 (*) 20.0 - 24.0 (mEq/L)    TCO2 27.3  0 - 100 (mmol/L)    Acid-Base Excess 5.0 (*) 0.0 - 2.0 (mmol/L)    O2 Saturation 94.1      Patient temperature 98.6      Collection site RIGHT RADIAL      Drawn by [id number]      Sample type ARTERIAL DRAW      Allens test (pass/fail) PASS  PASS    PRO B NATRIURETIC PEPTIDE     Status: Abnormal   Collection Time   11/02/11 12:45 PM      Component Value Range Comment   Pro B Natriuretic peptide (BNP) 609.1 (*) 0 - 125 (pg/mL)     Radiological Exams on Admission: Dg Chest 2 View  11/02/2011  *RADIOLOGY REPORT*  Clinical Data: Shortness of breath and cough.  CHEST - 2 VIEW  Comparison: Chest x-ray 10/31/2011.  Findings: Lung volumes are normal.  Bibasilar opacities are favored to represent areas of atelectasis.  No definite pleural effusions. There is cephalization of the pulmonary vasculature and slight indistinctness of the interstitial markings suggestive of mild pulmonary  edema.  Heart size is mildly enlarged. The patient is rotated to the left on today's exam, resulting in distortion of the mediastinal contours and reduced diagnostic sensitivity and specificity for mediastinal pathology.  Atherosclerotic calcifications within the arch of the aorta. The patient is status post median sternotomyfor CABG.  IMPRESSION: 1.  Mild cardiomegaly with evidence of mild interstitial pulmonary edema, suggesting mild congestive heart failure. 2.  Atherosclerosis. 3.  Status post median sternotomy for CABG.  Original Report Authenticated By: Florencia Reasons, M.D.   Dg Chest 2 View  10/31/2011  *RADIOLOGY REPORT*  Clinical Data: Shortness of breath.  CHEST - 2 VIEW  Comparison: 06/23/2010.  Findings: Surgical clips are seen in the expected location of the right thyroid.  Heart is enlarged, stable.  Pleural parenchymal densities in the perihilar and lower lung zones are likely stable from prior exams, with volume loss on the left and an elevated left hemidiaphragm.  No pleural fluid.  IMPRESSION: Probable perihilar and bibasilar pleural parenchymal scarring.  No definite acute findings.  Original Report Authenticated By: Reyes Ivan, M.D.    Assessment/Plan 1.  Acute respiratory failure- secondary to CHF and COPD exacerbation 2.  DIASTOLIC DYSFUNCTION 3.  Severe COPD/FEV1 of 0.88/38% 4. Pulm HTN 5. 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 OSA intolerant to CPAP Plan: Diurese with IV Lasix 40 mg q. 8 hours, continue Toprol, strict I.'s and O.'s and daily weights Check 2-D echocardiogram and cycle cardiac enzymes x2 more sets Last cath in 2009 showed patent grafts. For COPD exacerbation, will treat with IV Solu-Medrol 80 mg every 6, round the clock albuterol and Atrovent nebulizations, levofloxacin. ABG on admission reassuring shows normal pH and PCO2 in the 50s which is probably close to her baseline, in case of deterioration may need BiPAP but appears  relatively stable at this point. CODE STATUS discussed the patient she would like to be a DO NOT RESUSCITATE  Further management his condition evolves   Time Spent on Admission:  Marti Acebo [facility] Pager: [phone] 11/02/2011, 1:32 PM

## 2011-11-02 NOTE — Telephone Encounter (Signed)
Call from patient's husband and then was able to talk with patient.  Husband states that patient was seen in clinic on Monday for "breathing problems"  Received a breathing treatment.  A PCXR showed no acute change.  However since 2 am this morning patient has been up unable to breath.  I could hear the patient coughing in the background during my conversation with her husband.  When the patient got on the phone she was unable to complete a full sentence.  She admitted to constant coughing and intermittent wheezing.  She stated her breathing treatments were not working and she wanted to increase her oxygen.  She denies fever, chills, or chest pain.   Plan: I have instructed the patient to go to the local ED for continued evaluation and care of suspected resp exacerbation.

## 2011-11-02 NOTE — ED Provider Notes (Signed)
History     CSN: [id number]  Arrival date & time 11/02/11  0804   First MD Initiated Contact with Patient 11/02/11 0805      Chief Complaint  Patient presents with  . Shortness of Breath    (Consider location/radiation/quality/duration/timing/severity/associated sxs/prior treatment) HPI Pt with increasing SOB, cough, fever since Friday. Seen by PCP on Monday. Neg CXR, flu. Started on Doxy. Pt states her SOB has progressed since that time. Cough is unproductive. No CP. Given neb and IV steroids by EMS en route with some improvement of symptoms Past Medical History  Diagnosis Date  . Coronary heart disease   . COPD (chronic obstructive pulmonary disease)   . Pulmonary HTN   . H/O: rheumatic fever   . History of rheumatic heart disease     X 3-LAST TIME WHEN PATIENT WAS 71 YRS OLD  . Sleep apnea   . Hypertension   . H/O right bundle branch block   . Bursitis, shoulder     bilateral  . GERD (gastroesophageal reflux disease)   . H/O hiatal hernia   . Anxiety   . Depression   . H/O urinary incontinence   . Varicose veins     Past Surgical History  Procedure Date  . Bilateral salpingoophorectomy   . Vaginal delivery     x4  . Thyroidectomy, partial 2010    Dr Michaell Cowing  . Tubal ligation   . Total abdominal hysterectomy     1990  . Coronary artery bypass graft 06/21/2000    x4  . Cardiac catheterization 02/11/2008, 01/30/2004, 06/14/2000  . Bladder tack     Family History  Problem Relation Age of Onset  . Anemia Mother   . Emphysema Father   . Esophageal cancer Daughter     History  Substance Use Topics  . Smoking status: Former Smoker -- 1.0 packs/day for 47 years    Types: Cigarettes    Quit date: 07/26/2007  . Smokeless tobacco: Never Used  . Alcohol Use: No    OB History    Grav Para Term Preterm Abortions TAB SAB Ect Mult Living                  Review of Systems  Constitutional: Positive for fever and fatigue.  HENT: Negative for congestion and  sore throat.   Respiratory: Positive for cough, shortness of breath and wheezing.   Cardiovascular: Negative for chest pain, palpitations and leg swelling.  Gastrointestinal: Negative for nausea, vomiting and abdominal pain.  Skin: Negative for rash.  Neurological: Negative for weakness, numbness and headaches.    Allergies  Codeine and Ventolin  Home Medications   Current Outpatient Rx  Name Route Sig Dispense Refill  . ASPIRIN 81 MG PO TABS Oral Take 81 mg by mouth daily.      . CYCLOBENZAPRINE HCL 10 MG PO TABS Oral Take 10 mg by mouth at bedtime.      . FUROSEMIDE 20 MG PO TABS Oral Take 20 mg by mouth daily.      Marland Kitchen METOPROLOL TARTRATE 50 MG PO TABS Oral Take 50 mg by mouth 2 (two) times daily.      . SERTRALINE HCL 100 MG PO TABS Oral Take 100 mg by mouth daily.      Marland Kitchen SIMVASTATIN 80 MG PO TABS  1/2 tab by mouth at bedtime       BP 151/128  Pulse 105  Temp 98.9 F (37.2 C)  Resp 24  SpO2 95%  Physical Exam  Nursing note and vitals reviewed. Constitutional: She is oriented to person, place, and time. She appears well-developed and well-nourished. She appears distressed (mild resp distress).  HENT:  Head: Normocephalic and atraumatic.  Mouth/Throat: Oropharynx is clear and moist.  Eyes: EOM are normal. Pupils are equal, round, and reactive to light.  Neck: Normal range of motion. Neck supple.  Cardiovascular: Regular rhythm.        tachycardia  Pulmonary/Chest: Effort normal. No respiratory distress. She has wheezes. She has rales.       Diffuse wheezing and rhonchi present  Abdominal: Soft. Bowel sounds are normal. There is no tenderness. There is no rebound and no guarding.  Musculoskeletal: Normal range of motion. She exhibits no edema and no tenderness.  Neurological: She is alert and oriented to person, place, and time.  Skin: Skin is warm and dry. No rash noted. No erythema.  Psychiatric: She has a normal mood and affect. Her behavior is normal.    ED Course    Procedures (including critical care time)  Labs Reviewed  DIFFERENTIAL - Abnormal; Notable for the following:    Neutrophils Relative 83 (*)    Neutro Abs 7.8 (*)    Lymphocytes Relative 11 (*)    All other components within normal limits  BASIC METABOLIC PANEL - Abnormal; Notable for the following:    Glucose, Bld 141 (*)    All other components within normal limits  BLOOD GAS, ARTERIAL - Abnormal; Notable for the following:    pCO2 arterial 51.4 (*)    pO2, Arterial 69.4 (*)    Bicarbonate 30.6 (*)    Acid-Base Excess 5.0 (*)    All other components within normal limits  CBC  POCT I-STAT TROPONIN I   Dg Chest 2 View  11/02/2011  *RADIOLOGY REPORT*  Clinical Data: Shortness of breath and cough.  CHEST - 2 VIEW  Comparison: Chest x-ray 10/31/2011.  Findings: Lung volumes are normal.  Bibasilar opacities are favored to represent areas of atelectasis.  No definite pleural effusions. There is cephalization of the pulmonary vasculature and slight indistinctness of the interstitial markings suggestive of mild pulmonary edema.  Heart size is mildly enlarged. The patient is rotated to the left on today's exam, resulting in distortion of the mediastinal contours and reduced diagnostic sensitivity and specificity for mediastinal pathology.  Atherosclerotic calcifications within the arch of the aorta. The patient is status post median sternotomyfor CABG.  IMPRESSION: 1.  Mild cardiomegaly with evidence of mild interstitial pulmonary edema, suggesting mild congestive heart failure. 2.  Atherosclerosis. 3.  Status post median sternotomy for CABG.  Original Report Authenticated By: Florencia Reasons, M.D.   Dg Chest 2 View  10/31/2011  *RADIOLOGY REPORT*  Clinical Data: Shortness of breath.  CHEST - 2 VIEW  Comparison: 06/23/2010.  Findings: Surgical clips are seen in the expected location of the right thyroid.  Heart is enlarged, stable.  Pleural parenchymal densities in the perihilar and lower lung  zones are likely stable from prior exams, with volume loss on the left and an elevated left hemidiaphragm.  No pleural fluid.  IMPRESSION: Probable perihilar and bibasilar pleural parenchymal scarring.  No definite acute findings.  Original Report Authenticated By: Reyes Ivan, M.D.     1. COPD exacerbation   2. CHF exacerbation       Date: 11/02/2011  Rate: 104  Rhythm: sinus tachycardia  QRS Axis: normal  Intervals: Prolonged QRS  ST/T Wave abnormalities: nonspecific T wave changes  Conduction Disutrbances:right bundle branch block  Narrative Interpretation:   Old EKG Reviewed: unchanged   MDM   Mild improvement of respiratory status. Will need to be watched carefully. Any decompensation may require Bipap. This was relayed to admitting MD and pt will go to step down.        Loren Racer, MD 11/02/11 1039

## 2011-11-02 NOTE — ED Notes (Signed)
EMS brings pt from home pt c/o shortness of breath since last Friday, seen at [facility] for shortness of breath Monday, pt reports CXR negative at that time. Pt also co of nausea. Pt received 15mg  of Albuterol, 0.5mg  of Atrovent, and 125mg  IV of solu-medrol in route to hospital today.

## 2011-11-02 NOTE — Progress Notes (Signed)
VSS, SR on monitor. Instructed on importance of taking medication as prescribed. Stated "I did not take my lasix Thursday and Friday". CHF teaching reinforced, instructed to take meds as prescribed, not skipping any doses. Verbalized understanding. Assisted up to [facility] by nursing staff, instructed to call for help with ambulation and prn.

## 2011-11-02 NOTE — ED Notes (Signed)
Tried to call report, nurse unable to take.

## 2011-11-02 NOTE — Telephone Encounter (Signed)
Noted  

## 2011-11-02 NOTE — ED Notes (Signed)
[id number]> Expected date:<BR> Expected time:<BR> Means of arrival:<BR> Comments:<BR> Ems/sob/

## 2011-11-03 LAB — CARDIAC PANEL(CRET KIN+CKTOT+MB+TROPI)
CK, MB: 4.4 ng/mL — ABNORMAL HIGH (ref 0.3–4.0)
Relative Index: 0.6 (ref 0.0–2.5)
Total CK: 797 U/L — ABNORMAL HIGH (ref 7–177)
Troponin I: <0.3 ng/mL (ref <0.30)

## 2011-11-03 LAB — COMPREHENSIVE METABOLIC PANEL
AST: 36 U/L (ref 0–37)
Albumin: 3.2 g/dL — ABNORMAL LOW (ref 3.5–5.2)
BUN: 15 mg/dL (ref 6–23)
Calcium: 9 mg/dL (ref 8.4–10.5)
Chloride: 96 mEq/L (ref 96–112)
Creatinine, Ser: 0.64 mg/dL (ref 0.50–1.10)
GFR calc non Af Amer: 87 mL/min — ABNORMAL LOW (ref >90)
Total Bilirubin: 0.3 mg/dL (ref 0.3–1.2)

## 2011-11-03 LAB — CBC
HCT: 40.7 % (ref 36.0–46.0)
MCH: 28.4 pg (ref 26.0–34.0)
MCV: 89.6 fL (ref 78.0–100.0)
Platelets: 229 10*3/uL (ref 150–400)
RDW: 12.5 % (ref 11.5–15.5)

## 2011-11-03 MED ORDER — IPRATROPIUM BROMIDE 0.02 % IN SOLN
0.5000 mg | Freq: Three times a day (TID) | RESPIRATORY_TRACT | Status: DC
  Administered 2011-11-03 – 2011-11-04 (×5): 0.5 mg via RESPIRATORY_TRACT
  Filled 2011-11-03: qty 2.5

## 2011-11-03 MED ORDER — POTASSIUM CHLORIDE 20 MEQ/15ML (10%) PO LIQD
20.0000 meq | Freq: Once | ORAL | Status: DC
  Filled 2011-11-03: qty 15

## 2011-11-03 MED ORDER — LEVALBUTEROL HCL 0.63 MG/3ML IN NEBU
0.6300 mg | INHALATION_SOLUTION | Freq: Two times a day (BID) | RESPIRATORY_TRACT | Status: AC
  Administered 2011-11-03 – 2011-11-04 (×2): 0.63 mg via RESPIRATORY_TRACT
  Filled 2011-11-03 (×5): qty 3

## 2011-11-03 MED ORDER — ENOXAPARIN SODIUM 40 MG/0.4ML ~~LOC~~ SOLN
40.0000 mg | SUBCUTANEOUS | Status: DC
Start: 2011-11-03 — End: 2011-11-06
  Administered 2011-11-03: 30 mg via SUBCUTANEOUS
  Administered 2011-11-04 – 2011-11-05 (×2): 40 mg via SUBCUTANEOUS
  Filled 2011-11-03 (×4): qty 0.4

## 2011-11-03 MED ORDER — LEVOFLOXACIN 500 MG PO TABS
500.0000 mg | ORAL_TABLET | Freq: Every day | ORAL | Status: DC
  Administered 2011-11-03 – 2011-11-06 (×4): 500 mg via ORAL
  Filled 2011-11-03 (×4): qty 1

## 2011-11-03 MED ORDER — POTASSIUM CHLORIDE CRYS ER 20 MEQ PO TBCR
20.0000 meq | EXTENDED_RELEASE_TABLET | Freq: Every day | ORAL | Status: DC
  Administered 2011-11-03: 20 meq via ORAL
  Filled 2011-11-03 (×2): qty 1

## 2011-11-03 NOTE — Progress Notes (Signed)
  Echocardiogram 2D Echocardiogram has been performed.  Cathie Beams Deneen 11/03/2011, 1:43 PM

## 2011-11-03 NOTE — Progress Notes (Signed)
Subjective: Breathing much better  Objective: Vital signs in last 24 hours: Temp:  [97.8 F (36.6 C)-98.8 F (37.1 C)] 97.8 F (36.6 C) (04/11 0800) Pulse Rate:  [81-105] 81  (04/11 0400) Resp:  [22-26] 23  (04/11 0400) BP: (113-154)/(53-90) 136/63 mmHg (04/11 0400) SpO2:  [91 %-100 %] 94 % (04/11 0752) Weight:  [96.6 kg (212 lb 15.4 oz)] 96.6 kg (212 lb 15.4 oz) (04/10 1640) Weight change:     Intake/Output from previous day: 04/10 0701 - 04/11 0700 In: 380 [P.O.:100; I.V.:280] Out: 725 [Urine:725]     Physical Exam: General: Alert, awake, oriented x3, in no acute distress. HEENT: No bruits, no goiter. Heart: Regular rate and rhythm, without murmurs, rubs, gallops. Lungs: bibasilar crackles, and diffuse cwheezes. Abdomen: Soft, nontender, nondistended, positive bowel sounds. Extremities: No clubbing cyanosis or edema with positive pedal pulses. Neuro: Grossly intact, nonfocal.    Lab Results: Basic Metabolic Panel:  Basename 11/03/11 0118 11/02/11 1724 11/02/11 0850  NA 139 -- 137  K 3.1* -- 3.5  CL 96 -- 96  CO2 35* -- 32  GLUCOSE 173* -- 141*  BUN 15 -- 10  CREATININE 0.64 0.63 --  CALCIUM 9.0 -- 9.2  MG -- -- --  PHOS -- -- --   Liver Function Tests:  Basename 11/03/11 0118  AST 36  ALT 16  ALKPHOS 83  BILITOT 0.3  PROT 7.6  ALBUMIN 3.2*   No results found for this basename: LIPASE:2,AMYLASE:2 in the last 72 hours No results found for this basename: AMMONIA:2 in the last 72 hours CBC:  Basename 11/03/11 0118 11/02/11 1724 11/02/11 0850  WBC 7.2 6.9 --  NEUTROABS -- -- 7.8*  HGB 12.9 12.7 --  HCT 40.7 39.7 --  MCV 89.6 90.6 --  PLT 229 194 --   Cardiac Enzymes:  Basename 11/03/11 0118 11/02/11 1722  CKTOTAL 797* 853*  CKMB 4.4* 3.6  CKMBINDEX -- --  TROPONINI <0.30 <0.30   BNP:  Basename 11/02/11 1245  PROBNP 609.1*   D-Dimer: No results found for this basename: DDIMER:2 in the last 72 hours CBG: No results found for this  basename: GLUCAP:6 in the last 72 hours Hemoglobin A1C: No results found for this basename: HGBA1C in the last 72 hours Fasting Lipid Panel: No results found for this basename: CHOL,HDL,LDLCALC,TRIG,CHOLHDL,LDLDIRECT in the last 72 hours Thyroid Function Tests: No results found for this basename: TSH,T4TOTAL,FREET4,T3FREE,THYROIDAB in the last 72 hours Anemia Panel: No results found for this basename: VITAMINB12,FOLATE,FERRITIN,TIBC,IRON,RETICCTPCT in the last 72 hours Coagulation: No results found for this basename: LABPROT:2,INR:2 in the last 72 hours Urine Drug Screen: Drugs of Abuse  No results found for this basename: labopia, cocainscrnur, labbenz, amphetmu, thcu, labbarb    Alcohol Level: No results found for this basename: ETH:2 in the last 72 hours Urinalysis: No results found for this basename: COLORURINE:2,APPERANCEUR:2,LABSPEC:2,PHURINE:2,GLUCOSEU:2,HGBUR:2,BILIRUBINUR:2,KETONESUR:2,PROTEINUR:2,UROBILINOGEN:2,NITRITE:2,LEUKOCYTESUR:2 in the last 72 hours  Recent Results (from the past 240 hour(s))  MRSA PCR SCREENING     Status: Normal   Collection Time   11/02/11  4:46 PM      Component Value Range Status Comment   MRSA by PCR NEGATIVE  NEGATIVE  Final     Studies/Results: Dg Chest 2 View  11/02/2011  *RADIOLOGY REPORT*  Clinical Data: Shortness of breath and cough.  CHEST - 2 VIEW  Comparison: Chest x-ray 10/31/2011.  Findings: Lung volumes are normal.  Bibasilar opacities are favored to represent areas of atelectasis.  No definite pleural effusions. There is cephalization of the  pulmonary vasculature and slight indistinctness of the interstitial markings suggestive of mild pulmonary edema.  Heart size is mildly enlarged. The patient is rotated to the left on today's exam, resulting in distortion of the mediastinal contours and reduced diagnostic sensitivity and specificity for mediastinal pathology.  Atherosclerotic calcifications within the arch of the aorta. The patient  is status post median sternotomyfor CABG.  IMPRESSION: 1.  Mild cardiomegaly with evidence of mild interstitial pulmonary edema, suggesting mild congestive heart failure. 2.  Atherosclerosis. 3.  Status post median sternotomy for CABG.  Original Report Authenticated By: Florencia Reasons, M.D.    Medications: Scheduled Meds:   . albuterol  2.5 mg Nebulization Q4H  . albuterol  10 mg/hr Nebulization Once  . aspirin EC  81 mg Oral Daily  . atorvastatin  40 mg Oral q1800  . cyclobenzaprine  10 mg Oral QHS  . enoxaparin  40 mg Subcutaneous Q24H  . furosemide  40 mg Intravenous Once  . furosemide  40 mg Intravenous Q8H  . ipratropium  0.5 mg Nebulization TID  . levalbuterol  0.63 mg Nebulization BID  . levofloxacin (LEVAQUIN) IV  500 mg Intravenous Q24H  . methylPREDNISolone (SOLU-MEDROL) injection  80 mg Intravenous Q6H  . metoprolol  50 mg Oral BID  . potassium chloride  20 mEq Oral Once  . potassium chloride  20 mEq Oral Daily  . sertraline  100 mg Oral Daily  . sodium chloride  3 mL Intravenous Q12H  . DISCONTD: enoxaparin  30 mg Subcutaneous Q24H  . DISCONTD: ipratropium  0.5 mg Nebulization Q6H  . DISCONTD: ipratropium  0.5 mg Nebulization Q4H   Continuous Infusions:  PRN Meds:.sodium chloride, acetaminophen, acetaminophen, ipratropium, ondansetron (ZOFRAN) IV, ondansetron, sodium chloride  Assessment/Plan: 1. Acute respiratory failure- secondary to CHF and COPD exacerbation  2. DIASTOLIC DYSFUNCTION  3. 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 Pulm HTN  5. 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 OSA intolerant to CPAP  Plan:  Improving Continue to diurese with IV Lasix 40 mg q. 8 hours, continue Toprol, strict I.'s and O.'s and daily weights  Check 2-D echocardiogram  Last cath in 2009 showed patent grafts.  For COPD exacerbation, will treat with IV Solu-Medrol 80 mg every 6, round the clock albuterol and Atrovent nebulizations, levofloxacin.  Transfer to tele  today  CODE STATUS discussed the patient she would like to be a DO NOT RESUSCITATE     LOS: 1 day   [facility] Pager: [phone] 11/03/2011, 8:22 AM

## 2011-11-04 ENCOUNTER — Inpatient Hospital Stay (HOSPITAL_COMMUNITY): Payer: Medicare Other

## 2011-11-04 LAB — PRO B NATRIURETIC PEPTIDE: Pro B Natriuretic peptide (BNP): 449.4 pg/mL — ABNORMAL HIGH (ref 0–125)

## 2011-11-04 MED ORDER — POTASSIUM CHLORIDE CRYS ER 20 MEQ PO TBCR
40.0000 meq | EXTENDED_RELEASE_TABLET | Freq: Every day | ORAL | Status: DC
  Administered 2011-11-04 – 2011-11-06 (×3): 40 meq via ORAL
  Filled 2011-11-04 (×3): qty 2

## 2011-11-04 MED ORDER — FUROSEMIDE 10 MG/ML IJ SOLN
40.0000 mg | Freq: Two times a day (BID) | INTRAMUSCULAR | Status: DC
  Administered 2011-11-04: 40 mg via INTRAVENOUS
  Filled 2011-11-04 (×3): qty 4

## 2011-11-04 NOTE — Progress Notes (Signed)
   CARE MANAGEMENT NOTE 11/04/2011  Patient:  [name], [name]   Account Number:  [id number]  Date Initiated:  11/04/2011  Documentation initiated by:  Lanier Clam  Subjective/Objective Assessment:   ADMITTED W/SOB.     Action/Plan:   FROM HOME W/FAMILY.[facility]-HOME 02   Anticipated DC Date:  11/07/2011   Anticipated DC Plan:  HOME W HOME HEALTH SERVICES         Choice offered to / List presented to:             Status of service:  In process, will continue to follow Medicare Important Message given?   (If response is "NO", the following Medicare IM given date fields will be blank) Date Medicare IM given:   Date Additional Medicare IM given:    Discharge Disposition:    Per UR Regulation:  Reviewed for med. necessity/level of care/duration of stay  If discussed at Long Length of Stay Meetings, dates discussed:    Comments:  11/04/11 Paeton Latouche RN,BSN NCM [phone] [facility] CONTACTED TO FOLLOW FORHOME 02 MAY BE NEEDED FOR TRAVEL,& IF HH NEEDED [facility] CAN PROVIDE IF MD RECOMMENDS.

## 2011-11-04 NOTE — Progress Notes (Signed)
Remains in ICU stepdown status awaiting telemetry bed. CHF teaching reinforced, including s/sx of worsening CHF, need to comply to medication prescribed, need to f/u with labs when on lasix or with CHF in general. S/Sx of CHF including swelling of feet or ankles, exertional shortness of breathe, and all medications reviewed with patient. Verbalized understanding, needs frequent reinforcement. Family supportive at bedside.

## 2011-11-04 NOTE — Progress Notes (Signed)
Pt experienced flushing and heat on left side of face after atrovent treatment yesterday and today. Notified RT.

## 2011-11-04 NOTE — Progress Notes (Signed)
Subjective: Breathing much better, still feels weak  Objective: Vital signs in last 24 hours: Temp:  [97.8 F (36.6 C)-99 F (37.2 C)] 99 F (37.2 C) (04/12 0400) Pulse Rate:  [83-109] 89  (04/12 0335) Resp:  [22-29] 22  (04/12 0335) BP: (123-157)/(49-77) 123/77 mmHg (04/12 0335) SpO2:  [90 %-96 %] 91 % (04/12 0335) Weight change:     Intake/Output from previous day: 04/11 0701 - 04/12 0700 In: 155 [I.V.:155] Out: 1600 [Urine:1600]     Physical Exam: General: Alert, awake, oriented x3, in no acute distress. HEENT: No bruits, no goiter. Heart: Regular rate and rhythm, without murmurs, rubs, gallops. Lungs: bibasilar crackles, otherwise clear. Abdomen: Soft, nontender, nondistended, positive bowel sounds. Extremities: No clubbing cyanosis or edema with positive pedal pulses. Neuro: Grossly intact, nonfocal.    Lab Results: Basic Metabolic Panel:  Basename 11/03/11 0118 11/02/11 1724 11/02/11 0850  NA 139 -- 137  K 3.1* -- 3.5  CL 96 -- 96  CO2 35* -- 32  GLUCOSE 173* -- 141*  BUN 15 -- 10  CREATININE 0.64 0.63 --  CALCIUM 9.0 -- 9.2  MG -- -- --  PHOS -- -- --   Liver Function Tests:  Basename 11/03/11 0118  AST 36  ALT 16  ALKPHOS 83  BILITOT 0.3  PROT 7.6  ALBUMIN 3.2*   No results found for this basename: LIPASE:2,AMYLASE:2 in the last 72 hours No results found for this basename: AMMONIA:2 in the last 72 hours CBC:  Basename 11/03/11 0118 11/02/11 1724 11/02/11 0850  WBC 7.2 6.9 --  NEUTROABS -- -- 7.8*  HGB 12.9 12.7 --  HCT 40.7 39.7 --  MCV 89.6 90.6 --  PLT 229 194 --   Cardiac Enzymes:  Basename 11/03/11 0118 11/02/11 1722  CKTOTAL 797* 853*  CKMB 4.4* 3.6  CKMBINDEX -- --  TROPONINI <0.30 <0.30   BNP:  Basename 11/02/11 1245  PROBNP 609.1*   D-Dimer: No results found for this basename: DDIMER:2 in the last 72 hours CBG: No results found for this basename: GLUCAP:6 in the last 72 hours Hemoglobin A1C: No results found for  this basename: HGBA1C in the last 72 hours Fasting Lipid Panel: No results found for this basename: CHOL,HDL,LDLCALC,TRIG,CHOLHDL,LDLDIRECT in the last 72 hours Thyroid Function Tests: No results found for this basename: TSH,T4TOTAL,FREET4,T3FREE,THYROIDAB in the last 72 hours Anemia Panel: No results found for this basename: VITAMINB12,FOLATE,FERRITIN,TIBC,IRON,RETICCTPCT in the last 72 hours Coagulation: No results found for this basename: LABPROT:2,INR:2 in the last 72 hours Urine Drug Screen: Drugs of Abuse  No results found for this basename: labopia,  cocainscrnur,  labbenz,  amphetmu,  thcu,  labbarb    Alcohol Level: No results found for this basename: ETH:2 in the last 72 hours Urinalysis: No results found for this basename: COLORURINE:2,APPERANCEUR:2,LABSPEC:2,PHURINE:2,GLUCOSEU:2,HGBUR:2,BILIRUBINUR:2,KETONESUR:2,PROTEINUR:2,UROBILINOGEN:2,NITRITE:2,LEUKOCYTESUR:2 in the last 72 hours  Recent Results (from the past 240 hour(s))  MRSA PCR SCREENING     Status: Normal   Collection Time   11/02/11  4:46 PM      Component Value Range Status Comment   MRSA by PCR NEGATIVE  NEGATIVE  Final     Studies/Results: Dg Chest 2 View  11/02/2011  *RADIOLOGY REPORT*  Clinical Data: Shortness of breath and cough.  CHEST - 2 VIEW  Comparison: Chest x-ray 10/31/2011.  Findings: Lung volumes are normal.  Bibasilar opacities are favored to represent areas of atelectasis.  No definite pleural effusions. There is cephalization of the pulmonary vasculature and slight indistinctness of the interstitial markings suggestive  of mild pulmonary edema.  Heart size is mildly enlarged. The patient is rotated to the left on today's exam, resulting in distortion of the mediastinal contours and reduced diagnostic sensitivity and specificity for mediastinal pathology.  Atherosclerotic calcifications within the arch of the aorta. The patient is status post median sternotomyfor CABG.  IMPRESSION: 1.  Mild  cardiomegaly with evidence of mild interstitial pulmonary edema, suggesting mild congestive heart failure. 2.  Atherosclerosis. 3.  Status post median sternotomy for CABG.  Original Report Authenticated By: Florencia Reasons, M.D.    Medications: Scheduled Meds:    . aspirin EC  81 mg Oral Daily  . atorvastatin  40 mg Oral q1800  . cyclobenzaprine  10 mg Oral QHS  . enoxaparin  40 mg Subcutaneous Q24H  . furosemide  40 mg Intravenous BID  . ipratropium  0.5 mg Nebulization TID  . levalbuterol  0.63 mg Nebulization BID  . levofloxacin  500 mg Oral Daily  . methylPREDNISolone (SOLU-MEDROL) injection  80 mg Intravenous Q6H  . metoprolol  50 mg Oral BID  . potassium chloride  20 mEq Oral Once  . potassium chloride  40 mEq Oral Daily  . sertraline  100 mg Oral Daily  . sodium chloride  3 mL Intravenous Q12H  . DISCONTD: enoxaparin  30 mg Subcutaneous Q24H  . DISCONTD: furosemide  40 mg Intravenous Q8H  . DISCONTD: levofloxacin (LEVAQUIN) IV  500 mg Intravenous Q24H  . DISCONTD: potassium chloride  20 mEq Oral Daily   Continuous Infusions:  PRN Meds:.sodium chloride, acetaminophen, acetaminophen, ipratropium, ondansetron (ZOFRAN) IV, ondansetron, sodium chloride  Assessment/Plan: 1. Acute respiratory failure- secondary to CHF and COPD exacerbation  2. DIASTOLIC DYSFUNCTION  3. 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 Pulm HTN  5. 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 OSA intolerant to CPAP  Plan:  Improving Continue to diurese, change IV Lasix 40 mg q. 12 hours, continue Toprol, strict I.'s and O.'s and daily weights  2-D echocardiogram with grade 2 diastolic dysfucntion  Repeat CXR today Last cath in 2009 showed patent grafts.  For COPD exacerbation, change  IV Solu-Medrol to 60 mg every 6, round the clock albuterol and Atrovent nebulizations, levofloxacin.  Transfer to tele today  PT eval,Ambulate CODE STATUS DO NOT RESUSCITATE     LOS: 2 days    [facility] Pager: [phone] 11/04/2011, 7:22 AM

## 2011-11-04 NOTE — Evaluation (Signed)
Physical Therapy Evaluation Patient Details Name: [name] MRN: [id number] DOB: [birth year] Today's Date: 11/04/2011  Problem List:  Patient Active Problem List  Diagnoses  . CORONARY HEART DISEASE  . PULMONARY HYPERTENSION  . DIASTOLIC DYSFUNCTION  . C O P D  . SLEEP APNEA  . DYSPNEA  . Acute respiratory failure    Past Medical History:  Past Medical History  Diagnosis Date  . Coronary heart disease   . COPD (chronic obstructive pulmonary disease)   . Pulmonary HTN   . H/O: rheumatic fever   . History of rheumatic heart disease     X 3-LAST TIME WHEN PATIENT WAS 71 YRS OLD  . Sleep apnea   . Hypertension   . H/O right bundle branch block   . Bursitis, shoulder     bilateral  . GERD (gastroesophageal reflux disease)   . H/O hiatal hernia   . Anxiety   . Depression   . H/O urinary incontinence   . Varicose veins    Past Surgical History:  Past Surgical History  Procedure Date  . Bilateral salpingoophorectomy   . Vaginal delivery     x4  . Thyroidectomy, partial 2010    Dr Michaell Cowing  . Tubal ligation   . Total abdominal hysterectomy     1990  . Coronary artery bypass graft 06/21/2000    x4  . Cardiac catheterization 02/11/2008, 01/30/2004, 06/14/2000  . Bladder tack     PT Assessment/Plan/Recommendation PT Assessment Clinical Impression Statement: Patient admitted with CHF presents with decreased independence with mobility, decreased activity tolerance and will benefit from skilled PT in the acute setting to maximize independence and allow d/c home with intermittent assist of son and daughter (currently visiting from out of town) PT Recommendation/Assessment: Patient will need skilled PT in the acute care venue PT Problem List: Decreased activity tolerance;Decreased mobility;Decreased balance PT Therapy Diagnosis : Abnormality of gait;Generalized weakness PT Plan PT Frequency: Min 3X/week PT Treatment/Interventions: Gait training;DME instruction;Functional  mobility training;Therapeutic activities;Balance training;Therapeutic exercise;Patient/family education PT Recommendation Follow Up Recommendations: Home health PT Equipment Recommended: Rolling walker with 5" wheels; (also reports MD looking into home portable O2 for light weight tanks) PT Goals  Acute Rehab PT Goals PT Goal Formulation: With patient Time For Goal Achievement: 7 days Pt will go Sit to Stand: with modified independence PT Goal: Sit to Stand - Progress: Goal set today Pt will go Stand to Sit: with modified independence PT Goal: Stand to Sit - Progress: Goal set today Pt will Stand: with modified independence;3 - 5 min;with no upper extremity support (during functional task) PT Goal: Stand - Progress: Goal set today Pt will Ambulate: >150 feet;with modified independence;with least restrictive assistive device PT Goal: Ambulate - Progress: Goal set today Pt will Perform Home Exercise Program: with supervision, verbal cues required/provided PT Goal: Perform Home Exercise Program - Progress: Goal set today  PT Evaluation Precautions/Restrictions  Precautions Precautions: Fall Precaution Comments: Last fall broke toe on right foot Jul 05, 2011 (wore walking boot and used walker for [employer]) Prior Functioning  Home Living Lives With: Spouse;Son (patient cares for spouse bil amputee) Available Help at Discharge: Family Type of Home: House Home Access: Ramped entrance Home Layout: One level Bathroom Shower/Tub: Health visitor: Handicapped height Bathroom Accessibility: Yes How Accessible: Accessible via wheelchair Home Adaptive Equipment: Built-in shower seat;Walker - standard Additional Comments: spouses bathroom handicapped accessible; patient's bath with new fixtures and sink beside toilet Prior Function Level of Independence: Independent Cognition Cognition  Arousal/Alertness: Awake/alert Overall Cognitive Status: Appears within functional limits  for tasks assessed Sensation/Coordination Sensation Light Touch: Appears Intact Extremity Assessment RLE Assessment RLE Assessment: Within Functional Limits LLE Assessment LLE Assessment: Within Functional Limits Mobility (including Balance) Transfers Transfers: Yes Sit to Stand: 5: Supervision;4: Min assist;From chair/3-in-1;With upper extremity assist Sit to Stand Details (indicate cue type and reason): from recliner and from 3:1 with  minguard assist Stand to Sit: 4: Min assist;5: Supervision Stand to Sit Details: to 3:1 and back to recliner minguard for safety Ambulation/Gait Ambulation/Gait: Yes Ambulation/Gait Assistance: 5: Supervision;4: Min assist Ambulation/Gait Assistance Details (indicate cue type and reason): supervision to minguard assist with cues for posture, proximity to walker Ambulation Distance (Feet): 200 Feet Assistive device: Rolling walker Gait Pattern: Step-through pattern;Trunk flexed Gait velocity: WFL  Balance Balance Assessed: Yes Dynamic Standing Balance Dynamic Standing - Balance Support: No upper extremity supported;During functional activity Dynamic Standing - Level of Assistance: 5: Stand by assistance Dynamic Standing - Comments: while donning briefs Exercise    End of Session PT - End of Session Equipment Utilized During Treatment: Gait belt;Other (comment) (portable oxygen) Activity Tolerance: Patient limited by fatigue (Spo2 82-92%; HR 123 and RR up to 42) Patient left: in chair General Behavior During Session: [facility] for tasks performed Cognition: [facility] for tasks performed [facility] 11/04/2011, 4:33 PM

## 2011-11-05 LAB — CBC
HCT: 44 % (ref 36.0–46.0)
MCV: 91.5 fL (ref 78.0–100.0)
Platelets: 300 10*3/uL (ref 150–400)
RBC: 4.81 MIL/uL (ref 3.87–5.11)
RDW: 12.6 % (ref 11.5–15.5)
WBC: 11.1 10*3/uL — ABNORMAL HIGH (ref 4.0–10.5)

## 2011-11-05 LAB — BASIC METABOLIC PANEL
CO2: 39 mEq/L — ABNORMAL HIGH (ref 19–32)
Chloride: 92 mEq/L — ABNORMAL LOW (ref 96–112)
GFR calc Af Amer: 79 mL/min — ABNORMAL LOW (ref >90)
Potassium: 3.2 mEq/L — ABNORMAL LOW (ref 3.5–5.1)

## 2011-11-05 MED ORDER — FUROSEMIDE 40 MG PO TABS
40.0000 mg | ORAL_TABLET | Freq: Every day | ORAL | Status: DC
  Administered 2011-11-05 – 2011-11-06 (×2): 40 mg via ORAL
  Filled 2011-11-05 (×2): qty 1

## 2011-11-05 MED ORDER — METHYLPREDNISOLONE SODIUM SUCC 40 MG IJ SOLR
40.0000 mg | Freq: Four times a day (QID) | INTRAMUSCULAR | Status: DC
  Administered 2011-11-05 – 2011-11-06 (×5): 40 mg via INTRAVENOUS
  Filled 2011-11-05 (×8): qty 1

## 2011-11-05 NOTE — Progress Notes (Signed)
Pt oxygen saturations 87% on room air while ambulating.

## 2011-11-05 NOTE — Progress Notes (Signed)
Pt transferred to [id number] to Operating Room Services.

## 2011-11-06 LAB — BASIC METABOLIC PANEL
CO2: 41 mEq/L (ref 19–32)
Chloride: 95 mEq/L — ABNORMAL LOW (ref 96–112)
Glucose, Bld: 157 mg/dL — ABNORMAL HIGH (ref 70–99)
Potassium: 3.5 mEq/L (ref 3.5–5.1)
Sodium: 143 mEq/L (ref 135–145)

## 2011-11-06 MED ORDER — PREDNISONE (PAK) 10 MG PO TABS: 20.0000 mg | ORAL_TABLET | Freq: Every day | ORAL | Status: AC

## 2011-11-06 MED ORDER — POTASSIUM CHLORIDE CRYS ER 20 MEQ PO TBCR: 40.0000 meq | EXTENDED_RELEASE_TABLET | Freq: Every day | ORAL | Status: DC

## 2011-11-06 MED ORDER — LEVALBUTEROL HCL 0.63 MG/3ML IN NEBU: 0.6300 mg | INHALATION_SOLUTION | Freq: Four times a day (QID) | RESPIRATORY_TRACT | Status: DC | PRN

## 2011-11-06 MED ORDER — LEVALBUTEROL HCL 0.63 MG/3ML IN NEBU
0.6300 mg | INHALATION_SOLUTION | Freq: Four times a day (QID) | RESPIRATORY_TRACT | Status: DC
  Administered 2011-11-06: 0.63 mg via RESPIRATORY_TRACT
  Filled 2011-11-06 (×4): qty 3

## 2011-11-06 MED ORDER — FUROSEMIDE 20 MG PO TABS: 40.0000 mg | ORAL_TABLET | Freq: Every day | ORAL | Status: DC

## 2011-11-06 MED ORDER — TIOTROPIUM BROMIDE MONOHYDRATE 18 MCG IN CAPS: 18.0000 ug | ORAL_CAPSULE | Freq: Every day | RESPIRATORY_TRACT | Status: DC

## 2011-11-06 MED ORDER — CEFPODOXIME PROXETIL 200 MG PO TABS: 200.0000 mg | ORAL_TABLET | Freq: Two times a day (BID) | ORAL | Status: AC

## 2011-11-06 NOTE — Care Management Note (Signed)
Cm spoke with pt concerning home 02. Pt states portable 02 tanks too heavy. Cm contacted [facility] rep Talmadge Coventry. Per [facility] RT to contact pt to set up time for home eval for smaller sized tanks. MD order for HHPT. Per pt choice [facility] to provide PT. [facility] to provide pt with nebulizer per MD order prior to discharge.   Leonie Green [phone]

## 2011-11-06 NOTE — Progress Notes (Signed)
CRITICAL VALUE ALERT  Critical value received:  CO2 41  Date of notification:  11/06/2011  Time of notification:  0630  Critical value read back:yes  Nurse who received alert:  Murrell Redden, RN  MD notified (1st page): Lenny Pastel, NP  Time of first page:  0630  MD notified (2nd page):  Time of second page:  Responding MD:  Lenny Pastel, NP  Time MD responded:  [phone]

## 2011-11-10 ENCOUNTER — Telehealth: Payer: Self-pay | Admitting: Internal Medicine

## 2011-11-10 NOTE — Telephone Encounter (Signed)
Called, spoke with pt who reports she was discharged from [facility] on 11/06/11 d/t CHF and "fluid on lungs."  States she was told she needs to f/u with Dr. Maple Hudson within 2 wks but there is no HFU available until end of May.  Dr. Maple Hudson, pls advise if pt can be worked in.  Thank you.

## 2011-11-10 NOTE — Telephone Encounter (Signed)
HFU on 11-30-2011 at 11:15 am; pt to be here at 11 am. Pt notified.

## 2011-11-10 NOTE — Discharge Summary (Addendum)
Physician Discharge Summary  Patient ID: [name] MRN: [id number] DOB/AGE: November 01, 1940 71 y.o.  Admit date: 11/02/2011 Discharge date: 11/10/2011  Primary Care Physician:  Astrid Divine, MD, MD Pulmonologist Dr. Jetty Duhamel Cardiologist Dr. Eliott Nine  Discharge Diagnoses:   1. Acute respiratory failure 2. 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 Chronic respiratory failure on 2 L home oxygen 4. pulmonary hypertension 5. 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 history of rheumatic fever 7. obstructive sleep apnea 8. Hypertension 9. right bundle-branch block 10. History of hiatal hernia 11. Depression 12. History of urinary incontinence 13. Anxiety 14. OSA intolerant to CPAP  Medication List  As of 11/10/2011  9:07 PM   TAKE these medications         aspirin 81 MG tablet   Take 81 mg by mouth daily.      cyclobenzaprine 10 MG tablet   Commonly known as: FLEXERIL   Take 10 mg by mouth at bedtime.      furosemide 20 MG tablet   Commonly known as: LASIX   Take 2 tablets (40 mg total) by mouth daily.      levalbuterol 0.63 MG/3ML nebulizer solution   Commonly known as: XOPENEX   Take 3 mLs (0.63 mg total) by nebulization every 6 (six) hours as needed for wheezing or shortness of breath.      metoprolol 50 MG tablet   Commonly known as: LOPRESSOR   Take 50 mg by mouth 2 (two) times daily.      potassium chloride SA 20 MEQ tablet   Commonly known as: K-DUR,KLOR-CON   Take 2 tablets (40 mEq total) by mouth daily.      predniSONE 10 MG tablet   Commonly known as: STERAPRED UNI-PAK   Take 2 tablets (20 mg total) by mouth daily. 40mg  for 2 days then 20mg  for 2 days then 10mg  for 2 days then STOP      sertraline 100 MG tablet   Commonly known as: ZOLOFT   Take 100 mg by mouth daily.      simvastatin 80 MG tablet   Commonly known as: ZOCOR   1/2 tab by mouth at  bedtime      tiotropium 18 MCG inhalation capsule   Commonly known as: SPIRIVA   Place 1 capsule (18 mcg total) into inhaler and inhale daily.           Disposition and Follow-up:  PCP/Dr.Griffin  in 1 week Dr.Young in 2 weeks  Significant Diagnostic Studies:   CXR 4/10: IMPRESSION:  1. Mild cardiomegaly with evidence of mild interstitial pulmonary  edema, suggesting mild congestive heart failure.  2. Atherosclerosis.  3. Status post median sternotomy for CABG.  2D ECHO Study Conclusions: - Left ventricle: The cavity size was normal. Systolic function was vigorous. The estimated ejection fraction was in the range of 65% to 70%. Features are consistent with a pseudonormal left ventricular filling pattern, with concomitant abnormal relaxation and increased filling pressure (grade 2 diastolic dysfunction). Doppler parameters are consistent with high ventricular filling pressure. - Aortic valve: Mild regurgitation. - Mitral valve: The findings are consistent with mild to moderate stenosis. Mild regurgitation. Valve area by pressure half-time: 1.9cm^2. - Left atrium: The atrium was mildly dilated.   Brief H and P: Ms. [name] is a 71 year old female with history of chronic respiratory failure secondary to COPD/diastolic CHF on 2 L home O2 at baseline presents  to the ER today with increasing shortness of breath, cough, fevers and chills since Friday.  Patient reports that her symptoms started with cough congestion and shortness of breath on Friday which gradually worsened, subsequently went inside out within treated her with antibiotics and a nebulizer treatment. Despite this her symptoms progressed to worsening cough and shortness of breath now associated with PND and orthopnea.  She also reports that her cough is productive with thick yellow expectorant and wheezing.  Upon evaluation the ER her x-ray was consistent with mild pulmonary edema and [facility] were consulted  for further evaluation and management  Hospital Course:  1.  Acute  on chronic respiratory failure: Was felt to be secondary to exacerbation of her diastolic CHF and COPD exacerbation with bronchitis. 2.  DIASTOLIC DYSFUNCTION: Diuresed with IV Lasix, and significant clinical improvement with this. Was also continued on Toprol Had a 2-D echocardiogram which demonstrated grade 2 diastolic CHF Had a repeat chest x-ray which showed improvement, was subsequently transitioned to oral Lasix. In terms of her coronary disease her last Cath was in 2009 which showed patent grafts 3. COPD exacerbation with bronchitis: Treated with IV Solu-Medrol, round-the-clock nebulizations, oxygen, levofloxacin. Subsequently transitioned to oral steroids, and Spiriva added at the time of discharge. Advised to followup with Dr. Maple Hudson in 2 weeks, will likely need inhaled steroids Upon  ambulation her sats dropped to 86-87%, hence set up to have oxygen with activity.  Code status was discussed the patient during this hospitalization: DNR   Time spent on Discharge: Signed: Lillis Nuttle [facility]  11/10/2011, 9:07 PM

## 2011-11-15 ENCOUNTER — Telehealth: Payer: Self-pay | Admitting: Internal Medicine

## 2011-11-15 DIAGNOSIS — J449 Chronic obstructive pulmonary disease, unspecified: Secondary | ICD-10-CM

## 2011-11-15 NOTE — Telephone Encounter (Signed)
ATC home # NA. Called mobile # and NA the VM box has not been set up to leave a message wcb

## 2011-11-16 NOTE — Telephone Encounter (Signed)
ATC pt at both #'s again and NA [facility]

## 2011-11-16 NOTE — Telephone Encounter (Signed)
ATC pt at both #'s again and NA wcb

## 2011-11-17 NOTE — Telephone Encounter (Signed)
Called, spoke with pt who states Dr. Maple Hudson told her to wear 2l o2 "all the time" at last OV.  Pt states the has the big tanks but they are too difficult to take out when leaving the house.  She would like a portable system ordered.  Also, she was recently d/c'd from hospital and does have pending HFU with Dr. Maple Hudson on May 8 at 11:15.  Dr. Maple Hudson, pls advise if this is ok to order.  Thank you.

## 2011-11-17 NOTE — Telephone Encounter (Signed)
Ok to order DME evaluate for light portable O2, continuous and portable 2 L/M   Dx COPD

## 2011-11-17 NOTE — Telephone Encounter (Signed)
Patient is aware that we have placed order for her.

## 2011-11-30 ENCOUNTER — Ambulatory Visit (INDEPENDENT_AMBULATORY_CARE_PROVIDER_SITE_OTHER): Payer: Medicare Other | Admitting: Internal Medicine

## 2011-11-30 ENCOUNTER — Encounter: Payer: Self-pay | Admitting: Internal Medicine

## 2011-11-30 VITALS — BP 130/78 | HR 71 | Ht 67.0 in | Wt 213.2 lb

## 2011-11-30 DIAGNOSIS — I2789 Other specified pulmonary heart diseases: Secondary | ICD-10-CM

## 2011-11-30 DIAGNOSIS — J449 Chronic obstructive pulmonary disease, unspecified: Secondary | ICD-10-CM

## 2011-11-30 DIAGNOSIS — I519 Heart disease, unspecified: Secondary | ICD-10-CM

## 2011-11-30 DIAGNOSIS — G4730 Sleep apnea, unspecified: Secondary | ICD-10-CM

## 2011-11-30 NOTE — Progress Notes (Signed)
Patient ID: [name], female    DOB: Apr 26, 1941, 71 y.o.   MRN: [id number]  HPI 11/26/10-SATURATION QUALIFICATIONS: Patient Saturations on Room Air while Ambulating = 87% Patient Saturations on 2 Liters of oxygen while Ambulating = 96%[name],CMA   11/26/10 HPI: 41 yoF followed for COPD, sleep apnea, complicated by pulmonary hypertension. Last here July 29, 2010. PFT reviewed then- severe COPD FEV1 0.88/ 38%. Pollen season causing watery nose.  She took advice from a tv ad and changed her O2 DME to a [employer], but now wants to change back to [facility]. She continues O2 mostly at night. Wearing heart monitor because of tachypalpitation/ Dr Mayford Knife.. Back pain/ DGD limits her acitivity as much as her dyspnea.  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   04/01/11-  70 yoF followed for COPD, sleep apnea, complicated by pulmonary hypertension. Heart monitor results ok- Dr Mayford Knife- for tachypalpitation. Back pain was slowing her down. She got first epidural steroid injection this week.  Dr Michaell Cowing was following for THYROID nodule with hx partial thyroidectomy.  Flu vax talk.  She stays in when hot outdoors, rather than bothering with her portable oxygen. Continues O2 2L [facility] for sleep. Feels she is breathing some better lately, not worse. Says CPAP broke her face out- failed to tolerate.   11/30/11-  70 yoF followed for COPD, sleep apnea, complicated by pulmonary hypertension. PCP Dr Maurice Small, Card- Dr Renelda Mom hospital visit. Hospitalized April 10-14 according to family although discharge summary indicates April 10 through April 18. 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 History of intolerance to CPAP. CODE STATUS during this hospitalization was DO NOT RESUSCITATE. She and her daughter indicate that  she is feeling much better. Now off prednisone, oxygen added.  She describes a new dry cough and is noted to be on an ACE inhibitor which we discussed as one possible cause for the cough. Home oxygen continuous and portable 2 L/[facility]. CXR- 11/04/11- images reviewed IMPRESSION:  Volume loss at the left lung base. Streaky opacity is favored to  reflect atelectasis but infection is difficult to exclude. No  pulmonary edema or pleural effusions.  Original Report Authenticated By: [name], M.D.  ECHO- EF 65-70%. Grade 2 diastolic dysfunction. Mild aortic valve regurgitation. Mild to moderate mitral stenosis with mild mitral regurgitation. Left atrium mildly dilated.  Review of Systems- see HPI Constitutional:   No weight loss, night sweats, Fevers, chills, fatigue, lassitude. HEENT:   No headaches,  Difficulty swallowing,  Tooth/dental problems,  Sore throat,                No sneezing, itching, ear ache, nasal congestion, CV:  No chest pain, orthopnea, PND, swelling in lower extremities, anasarca, dizziness, palpitations GI  No heartburn, indigestion, abdominal pain, nausea, vomiting, diarrhea, change in bowel habits, loss of appetite Resp:   No excess mucus, no productive cough,  No non-productive cough,  No coughing up of blood.  No change in color of mucus.  No wheezing.   Skin: no rash or lesions. GU: . MS:  No joint pain or swelling.   Psych:  No change in mood or affect. No depression or anxiety.  No memory loss.  Objective:   Physical Exam General- Alert, Oriented, Affect-appropriate, Distress- none acute   Obese. Portable O2 2L/M  Skin- rash-none, lesions- none, excoriation- none  Lymphadenopathy- none Head- atraumatic            Eyes- Gross vision intact, PERRLA, conjunctivae clear secretions            Ears- Hearing, canals normal            Nose- Clear, No-septal dev, mucus, polyps, erosion, perforation             Throat- Mallampati III , mucosa clear/ dry , drainage-  none, tonsils- atrophic Neck- flexible , trachea midline, no stridor , thyroid nl, carotid no bruit Chest - symmetrical excursion , unlabored           Heart/CV- RRR , no murmur , no gallop  , no rub, nl s1 s2                           - JVD- none , edema- none, stasis changes- none, varices- none           Lung- clear to P&A, wheeze- none, cough- none , dullness-none, rub- none           Chest wall-  Abd-  Br/ Gen/ Rectal- Not done, not indicated Extrem- cyanosis- none, clubbing, none, atrophy- none, strength- nl Neuro- grossly intact to observation          Assessment & Plan:

## 2011-11-30 NOTE — Patient Instructions (Signed)
Issues to discuss with Dr Mayford Knife - If the lisinopril ACE inhibitor is causing too much dry cough then can Dr Mayford Knife try you with an "ARB" instead?  Deboraha Sprang did your sleep study. You couldn't use the CPAP mask before because it broke your face out. Could she ask [facility] to try again, using a "nasal pillows mask"?  Ask [facility] about renting a portable O2 concentrator for a beach trip. I can prescribe it it they can provide one that meets your needs.

## 2011-12-04 NOTE — Assessment & Plan Note (Signed)
Severe COPD. Recent exacerbation was primarily cardiac related. Addition of oxygen and diuresis. She feels Spiriva is sufficient as her only respiratory medication now. We will need to watch for evidence of bronchospasm. Watch for stability. She finds dry cough bothersome enough to mention so I suggested that she discuss it with Dr. Mayford Knife: consideration of trial with an ARB instead of an ACE inhibitor.

## 2011-12-04 NOTE — Assessment & Plan Note (Signed)
Mostly secondary to COPD Supplemental oxygen 2 L per minute/[facility]

## 2011-12-04 NOTE — Assessment & Plan Note (Signed)
Managed by Dr. Turner 

## 2011-12-04 NOTE — Assessment & Plan Note (Signed)
Managed by Dr. Mayford Knife. I wonder if she could tolerate a nasal pillows mask

## 2012-04-03 ENCOUNTER — Encounter: Payer: Self-pay | Admitting: Internal Medicine

## 2012-04-03 ENCOUNTER — Ambulatory Visit (INDEPENDENT_AMBULATORY_CARE_PROVIDER_SITE_OTHER): Payer: Medicare Other | Admitting: Internal Medicine

## 2012-04-03 VITALS — BP 108/60 | HR 72 | Ht 67.0 in | Wt 211.4 lb

## 2012-04-03 DIAGNOSIS — Z23 Encounter for immunization: Secondary | ICD-10-CM

## 2012-04-03 DIAGNOSIS — G4733 Obstructive sleep apnea (adult) (pediatric): Secondary | ICD-10-CM

## 2012-04-03 DIAGNOSIS — J449 Chronic obstructive pulmonary disease, unspecified: Secondary | ICD-10-CM

## 2012-04-03 DIAGNOSIS — J302 Other seasonal allergic rhinitis: Secondary | ICD-10-CM

## 2012-04-03 NOTE — Progress Notes (Signed)
Patient ID: [name], female    DOB: 06-01-1941, 71 y.o.   MRN: [id number]  HPI 11/26/10-SATURATION QUALIFICATIONS: Patient Saturations on Room Air while Ambulating = 87% Patient Saturations on 2 Liters of oxygen while Ambulating = 96%[name],CMA   11/26/10 HPI: 69 yoF followed for COPD, sleep apnea, complicated by pulmonary hypertension. Last here July 29, 2010. PFT reviewed then- severe COPD FEV1 0.88/ 38%. Pollen season causing watery nose.  She took advice from a tv ad and changed her O2 DME to a [employer], but now wants to change back to [facility]. She continues O2 mostly at night. Wearing heart monitor because of tachypalpitation/ Dr Mayford Knife.. Back pain/ DGD limits her acitivity as much as her dyspnea.  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   04/01/11-  70 yoF followed for COPD, sleep apnea, complicated by pulmonary hypertension. Heart monitor results ok- Dr Mayford Knife- for tachypalpitation. Back pain was slowing her down. She got first epidural steroid injection this week.  Dr Michaell Cowing was following for THYROID nodule with hx partial thyroidectomy.  Flu vax talk.  She stays in when hot outdoors, rather than bothering with her portable oxygen. Continues O2 2L [facility] for sleep. Feels she is breathing some better lately, not worse. Says CPAP broke her face out- failed to tolerate.   11/30/11-  70 yoF followed for COPD, sleep apnea, complicated by pulmonary hypertension. PCP Dr Maurice Small, Card- Dr Renelda Mom hospital visit. Hospitalized April 10-14 according to family although discharge summary indicates April 10 through April 18. 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 History of intolerance to CPAP. CODE STATUS during this hospitalization was DO NOT RESUSCITATE. She and her daughter indicate that  she is feeling much better. Now off prednisone, oxygen added.  She describes a new dry cough and is noted to be on an ACE inhibitor which we discussed as one possible cause for the cough. Home oxygen continuous and portable 2 L/[facility]. CXR- 11/04/11- images reviewed IMPRESSION:  Volume loss at the left lung base. Streaky opacity is favored to  reflect atelectasis but infection is difficult to exclude. No  pulmonary edema or pleural effusions.  Original Report Authenticated By: [name], M.D.  ECHO- EF 65-70%. Grade 2 diastolic dysfunction. Mild aortic valve regurgitation. Mild to moderate mitral stenosis with mild mitral regurgitation. Left atrium mildly dilated.  04/03/12-  70 yoF followed for COPD, sleep apnea/ failed CPAP, complicated by pulmonary hypertension. PCP Dr Maurice Small, Card- Dr Mayford Knife Breathing at baseline, using O2 2L/ [facility] for sleep. She notices that her upper eyelids get red without irritation of the globes. She has been having fall season related nasal congestion. Using no regular medication, just her oxygen. She failed to tolerate CPAP and just wears oxygen.  Review of Systems- see HPI Constitutional:   No weight loss, night sweats, Fevers, chills, fatigue, lassitude. HEENT:   No headaches,  Difficulty swallowing,  Tooth/dental problems,  Sore throat,                +sneezing, itching, ear ache, nasal congestion, CV:  No chest pain, orthopnea, PND, swelling in lower extremities, anasarca, dizziness, palpitations GI  No heartburn, indigestion, abdominal pain, nausea, vomiting, diarrhea, change in bowel habits, loss of appetite Resp:   No excess mucus, no productive cough,  No non-productive cough,  No coughing up of blood.  No change in color of mucus.  No wheezing.   Skin: no rash or lesions. GU: . MS:  No joint pain or swelling.   Psych:  No change in mood or affect. No depression or anxiety.  No memory loss.  Objective:   Physical Exam General- Alert,  Oriented, Affect-appropriate, Distress- none acute   Obese. Portable O2 2L/M  Skin- rash-none, lesions- none, excoriation- none Lymphadenopathy- none Head- atraumatic            Eyes- Gross vision intact, PERRLA, conjunctivae clear secretions            Ears- Hearing, canals normal            Nose- Clear, No-septal dev, mucus, polyps, erosion, perforation             Throat- Mallampati III , mucosa clear/ dry , drainage- none, tonsils- atrophic Neck- flexible , trachea midline, no stridor , thyroid nl, carotid no bruit Chest - symmetrical excursion , unlabored           Heart/CV- RRR , no murmur heard today , no gallop  , no rub, nl s1 s2                           - JVD- none , edema- none, stasis changes- none, varices- none           Lung- clear to P&A, wheeze- none, cough- none , dullness-none, rub- none           Chest wall-  Abd-  Br/ Gen/ Rectal- Not done, not indicated Extrem- cyanosis- none, clubbing, none, atrophy- none, strength- nl Neuro- grossly intact to observatio  Assessment & Plan:

## 2012-04-03 NOTE — Patient Instructions (Addendum)
Continue using oxygen, 2 L/M while asleep  Flu vax  Try a little vaseline on your eyelids, especially at bedtime, to soothe the skin irritation

## 2012-04-04 ENCOUNTER — Ambulatory Visit: Payer: Medicare Other | Admitting: Internal Medicine

## 2012-04-11 ENCOUNTER — Encounter: Payer: Self-pay | Admitting: Internal Medicine

## 2012-04-11 DIAGNOSIS — J302 Other seasonal allergic rhinitis: Secondary | ICD-10-CM | POA: Insufficient documentation

## 2012-04-11 NOTE — Assessment & Plan Note (Signed)
Comfortable control without exacerbation

## 2012-04-11 NOTE — Assessment & Plan Note (Signed)
She is compliant with oxygen for sleep

## 2012-04-11 NOTE — Assessment & Plan Note (Signed)
Recognized increased nasal congestion and drainage with fall weather, managed with OTC meds. Mild irritation of the upper eyelids is nonspecific. There is no actual conjunctivitis demonstrated. Plan-can try putting a little Vaseline on her eyelids at bedtime. Flu vaccine

## 2012-07-27 ENCOUNTER — Telehealth: Payer: Self-pay | Admitting: Internal Medicine

## 2012-07-27 MED ORDER — ACLIDINIUM BROMIDE 400 MCG/ACT IN AEPB: 1.0000 | INHALATION_SPRAY | Freq: Two times a day (BID) | RESPIRATORY_TRACT | Status: DC

## 2012-07-27 NOTE — Telephone Encounter (Signed)
Patient states she has had some SOB since before Christmas. She went to cardiologist, had EKG done--per pt this was normal-- as well as an increase in lasix. Patient says cardiologist suggested she talk back with Dr. Maple Hudson because it seemed to be more of a "lung" problem.  Patient states she is not currently on inhalers because has found it difficult to use something that doesn't "break her mouth out."  Patient is scheduled to come in to see Dr. Maple Hudson 08/27/12.  Patient would like to know if Dr. Maple Hudson would recommend and inhaler for her to use prior to that visit to help with her shortness of breath.  Dr. Maple Hudson please advise, thank you  Last OV: 04/03/12 Next OV: 08/27/12  Allergies  Allergen Reactions  . Prednisone     jittery  . Xopenex (Levalbuterol Hydrochloride)     Made mouth and throat sore  . Codeine   . Ventolin (ZOX:[id number])     "jittery"    Current Outpatient Prescriptions on File Prior to Visit  Medication Sig Dispense Refill  . aspirin 81 MG tablet Take 81 mg by mouth daily.        . cyclobenzaprine (FLEXERIL) 10 MG tablet Take 10 mg by mouth at bedtime.        . furosemide (LASIX) 40 MG tablet Take 1 tablet by mouth daily.      Marland Kitchen lisinopril (PRINIVIL,ZESTRIL) 5 MG tablet Take 1 tablet by mouth daily.      . metoprolol (LOPRESSOR) 50 MG tablet Take 50 mg by mouth 2 (two) times daily.        . sertraline (ZOLOFT) 100 MG tablet Take 100 mg by mouth daily.        . simvastatin (ZOCOR) 80 MG tablet 1/2 tab by mouth at bedtime

## 2012-07-27 NOTE — Telephone Encounter (Signed)
Per CY-offer sample of Tudorza 1 puff bid and rinse after use.

## 2012-07-27 NOTE — Telephone Encounter (Signed)
Called, spoke with pt.  Informed her of below per Dr. Maple Hudson.  She would like to try the Tudorza 1 puff bid and rinse after each use to see if this helps her breathing.  She is aware a sample has been placed at the front.  I did advise she will need to ask to have a nurse show her how to use this when she picks it up.  She verbalized understanding of this and of instructions. I have also placed a reminder on the sample bag that pt will need inhaler teaching from a nurse.  Pt will call back with a report on how this helps breathing and will call back sooner if breathing worsens.  Nothing further needed at this time.

## 2012-08-27 ENCOUNTER — Ambulatory Visit (INDEPENDENT_AMBULATORY_CARE_PROVIDER_SITE_OTHER): Payer: Medicare Other | Admitting: Internal Medicine

## 2012-08-27 ENCOUNTER — Encounter: Payer: Self-pay | Admitting: Internal Medicine

## 2012-08-27 VITALS — BP 116/72 | HR 75 | Ht 67.0 in | Wt 214.4 lb

## 2012-08-27 DIAGNOSIS — J439 Emphysema, unspecified: Secondary | ICD-10-CM

## 2012-08-27 DIAGNOSIS — J309 Allergic rhinitis, unspecified: Secondary | ICD-10-CM

## 2012-08-27 DIAGNOSIS — J438 Other emphysema: Secondary | ICD-10-CM

## 2012-08-27 DIAGNOSIS — J302 Other seasonal allergic rhinitis: Secondary | ICD-10-CM

## 2012-08-27 DIAGNOSIS — I2789 Other specified pulmonary heart diseases: Secondary | ICD-10-CM

## 2012-08-27 MED ORDER — AZELASTINE-FLUTICASONE 137-50 MCG/ACT NA SUSP: 2.0000 | Freq: Every day | NASAL | Status: DC

## 2012-08-27 NOTE — Patient Instructions (Addendum)
Sample Dymista nasal spray     1-2 puffs each nostril once daily at bedtime  After using up the nasal spray, if you still need something for drippy nose and postnasal drip try otc antihistamine like Claritin/ loratadine or Allegra/ fexofenadine as needed

## 2012-08-27 NOTE — Progress Notes (Signed)
Patient ID: [name], female    DOB: Jan 12, 1941, 72 y.o.   MRN: [id number]  HPI 11/26/10-SATURATION QUALIFICATIONS: Patient Saturations on Room Air while Ambulating = 87% Patient Saturations on 2 Liters of oxygen while Ambulating = 96%[name],CMA   11/26/10 HPI: 34 yoF followed for COPD, sleep apnea, complicated by pulmonary hypertension. Last here July 29, 2010. PFT reviewed then- severe COPD FEV1 0.88/ 38%. Pollen season causing watery nose.  She took advice from a tv ad and changed her O2 DME to a [employer], but now wants to change back to [facility]. She continues O2 mostly at night. Wearing heart monitor because of tachypalpitation/ Dr Mayford Knife.. Back pain/ DGD limits her acitivity as much as her dyspnea.  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   04/01/11-  70 yoF followed for COPD, sleep apnea, complicated by pulmonary hypertension. Heart monitor results ok- Dr Mayford Knife- for tachypalpitation. Back pain was slowing her down. She got first epidural steroid injection this week.  Dr Michaell Cowing was following for THYROID nodule with hx partial thyroidectomy.  Flu vax talk.  She stays in when hot outdoors, rather than bothering with her portable oxygen. Continues O2 2L [facility] for sleep. Feels she is breathing some better lately, not worse. Says CPAP broke her face out- failed to tolerate.   11/30/11-  70 yoF followed for COPD, sleep apnea, complicated by pulmonary hypertension. PCP Dr Maurice Small, Card- Dr Renelda Mom hospital visit. Hospitalized April 10-14 according to family although discharge summary indicates April 10 through April 18. 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 History of intolerance to CPAP. CODE STATUS during this hospitalization was DO NOT RESUSCITATE. She and her daughter indicate that  she is feeling much better. Now off prednisone, oxygen added.  She describes a new dry cough and is noted to be on an ACE inhibitor which we discussed as one possible cause for the cough. Home oxygen continuous and portable 2 L/[facility]. CXR- 11/04/11- images reviewed IMPRESSION:  Volume loss at the left lung base. Streaky opacity is favored to  reflect atelectasis but infection is difficult to exclude. No  pulmonary edema or pleural effusions.  Original Report Authenticated By: [name], M.D.  ECHO- EF 65-70%. Grade 2 diastolic dysfunction. Mild aortic valve regurgitation. Mild to moderate mitral stenosis with mild mitral regurgitation. Left atrium mildly dilated.  04/03/12-  70 yoF followed for COPD, sleep apnea/ failed CPAP, complicated by pulmonary hypertension. PCP Dr Maurice Small, Card- Dr Mayford Knife Breathing at baseline, using O2 2L/ [facility] for sleep. She notices that her upper eyelids get red without irritation of the globes. She has been having fall season related nasal congestion. Using no regular medication, just her oxygen. She failed to tolerate CPAP and just wears oxygen.  08/27/12- 71 yoF followed for COPD, sleep apnea/ failed CPAP, complicated by pulmonary hypertension. PCP Dr Maurice Small, Card- Dr Donneta Romberg FOR: still SOB at times; coughs when feels nose is running Blames cough on post nasal drip noted to "allergy". Benadryl over dries and makes head itch. Wearing oxygen 2 L/[facility] for sleep. Little cough. No chest pain, swelling, palpitation or blood.  Review of Systems- see HPI Constitutional:   No weight loss, night sweats, Fevers, chills, fatigue, lassitude. HEENT:   No headaches,  Difficulty swallowing,  Tooth/dental problems,  Sore throat,                +  sneezing, itching, ear ache, nasal congestion, CV:  No chest pain, orthopnea, PND, swelling in lower extremities, anasarca, dizziness, palpitations GI  No heartburn, indigestion, abdominal pain, nausea,  vomiting, diarrhea,  Resp:   No excess mucus, no productive cough,  No non-productive cough,  No coughing up of blood.  No change in color of mucus.  No wheezing.   Skin: no rash or lesions. GU: . MS:  No joint pain or swelling.   Psych:  No change in mood or affect. No depression or anxiety.  No memory loss.  Objective:   Physical Exam BP 116/72  Pulse 75  Ht 5\' 7"  (1.702 m)  Wt 214 lb 6.4 oz (97.251 kg)  BMI 33.57 kg/m2  SpO2 92% General- Alert, Oriented, Affect-appropriate, Distress- none acute   Obese. Skin- rash-none, lesions- none, excoriation- none Lymphadenopathy- none Head- atraumatic            Eyes- Gross vision intact, PERRLA, conjunctivae clear secretions            Ears- Hearing, canals normal            Nose- Clear, No-septal dev, mucus, polyps, erosion, perforation             Throat- Mallampati III , mucosa clear/ dry , drainage- none, tonsils- atrophic. Large tongue. Neck- flexible , trachea midline, no stridor , thyroid nl, carotid no bruit Chest - symmetrical excursion , unlabored           Heart/CV- RRR , no murmur heard today , no gallop  , no rub, nl s1 s2                           - JVD- none , edema- none, stasis changes- none, varices- none           Lung- clear to P&A, wheeze- none, cough- none , dullness-none, rub- none           Chest wall-  Abd-  Br/ Gen/ Rectal- Not done, not indicated Extrem- cyanosis- none, clubbing, none, atrophy- none, strength- nl Neuro- grossly intact to observation  Assessment & Plan:

## 2012-09-04 NOTE — Assessment & Plan Note (Signed)
Maintaining oxygen with sleep should help reduce this problem

## 2012-09-04 NOTE — Assessment & Plan Note (Signed)
This chronic hypoxic respiratory failure with oxygen 2L/ [facility], for sleep

## 2012-09-04 NOTE — Assessment & Plan Note (Signed)
Plan-sample Dymista nasal spray. Over-the-counter loratadine. Consider that at least part of her nasal complaints may be a vasomotor rhinitis.

## 2012-09-20 ENCOUNTER — Other Ambulatory Visit: Payer: Self-pay | Admitting: Neurosurgery

## 2012-09-20 DIAGNOSIS — M519 Unspecified thoracic, thoracolumbar and lumbosacral intervertebral disc disorder: Secondary | ICD-10-CM

## 2012-09-23 ENCOUNTER — Ambulatory Visit
Admission: RE | Admit: 2012-09-23 | Discharge: 2012-09-23 | Disposition: A | Discharge status: Home / Self Care | Payer: Medicare Other | Source: Ambulatory Visit | Attending: Neurosurgery | Admitting: Neurosurgery

## 2012-09-23 DIAGNOSIS — M519 Unspecified thoracic, thoracolumbar and lumbosacral intervertebral disc disorder: Secondary | ICD-10-CM

## 2012-09-26 ENCOUNTER — Other Ambulatory Visit: Payer: Medicare Other

## 2012-09-27 ENCOUNTER — Other Ambulatory Visit: Payer: Self-pay | Admitting: Neurosurgery

## 2012-10-04 ENCOUNTER — Ambulatory Visit: Payer: Medicare Other | Admitting: Internal Medicine

## 2012-10-11 ENCOUNTER — Inpatient Hospital Stay (HOSPITAL_COMMUNITY): Admission: RE | Admit: 2012-10-11 | Payer: Medicare Other | Source: Ambulatory Visit

## 2012-10-16 ENCOUNTER — Inpatient Hospital Stay (HOSPITAL_COMMUNITY): Admission: RE | Admit: 2012-10-16 | Payer: Medicare Other | Source: Ambulatory Visit | Admitting: Neurosurgery

## 2012-10-16 ENCOUNTER — Encounter (HOSPITAL_COMMUNITY): Admission: RE | Payer: Self-pay | Source: Ambulatory Visit

## 2012-10-16 SURGERY — LUMBAR LAMINECTOMY/DECOMPRESSION MICRODISCECTOMY 1 LEVEL
Anesthesia: General | Site: Back

## 2012-10-25 ENCOUNTER — Telehealth: Payer: Self-pay | Admitting: Internal Medicine

## 2012-10-25 NOTE — Telephone Encounter (Signed)
I spoke with pt. She stated she is needing surgical clearance for back surgery. She is requesting OV to discuss this with CDY personally bc she has many concerns d/t her breathing. Pt scheduled to come in 10/26/12 to see him.

## 2012-10-26 ENCOUNTER — Encounter: Payer: Self-pay | Admitting: Internal Medicine

## 2012-10-26 ENCOUNTER — Ambulatory Visit (INDEPENDENT_AMBULATORY_CARE_PROVIDER_SITE_OTHER): Payer: Medicare Other | Admitting: Internal Medicine

## 2012-10-26 ENCOUNTER — Ambulatory Visit (INDEPENDENT_AMBULATORY_CARE_PROVIDER_SITE_OTHER)
Admission: RE | Admit: 2012-10-26 | Discharge: 2012-10-26 | Disposition: A | Discharge status: Home / Self Care | Payer: Medicare Other | Source: Ambulatory Visit | Attending: Internal Medicine | Admitting: Internal Medicine

## 2012-10-26 VITALS — BP 140/78 | HR 69 | Ht 66.0 in | Wt 216.0 lb

## 2012-10-26 DIAGNOSIS — G4733 Obstructive sleep apnea (adult) (pediatric): Secondary | ICD-10-CM

## 2012-10-26 DIAGNOSIS — J438 Other emphysema: Secondary | ICD-10-CM

## 2012-10-26 DIAGNOSIS — I2789 Other specified pulmonary heart diseases: Secondary | ICD-10-CM

## 2012-10-26 DIAGNOSIS — J439 Emphysema, unspecified: Secondary | ICD-10-CM

## 2012-10-26 MED ORDER — TIOTROPIUM BROMIDE MONOHYDRATE 18 MCG IN CAPS: 18.0000 ug | ORAL_CAPSULE | Freq: Every day | RESPIRATORY_TRACT | Status: DC

## 2012-10-26 NOTE — Patient Instructions (Addendum)
Order- CXR dx COPD  Sample Spiriva  1 daily   See if it helps your breathing

## 2012-10-26 NOTE — Progress Notes (Signed)
Patient ID: [name], female    DOB: 05-12-1941, 72 y.o.   MRN: [id number]  HPI 11/26/10-SATURATION QUALIFICATIONS: Patient Saturations on Room Air while Ambulating = 87% Patient Saturations on 2 Liters of oxygen while Ambulating = 96%[name],CMA   11/26/10 HPI: 78 yoF followed for COPD, sleep apnea, complicated by pulmonary hypertension. Last here July 29, 2010. PFT reviewed then- severe COPD FEV1 0.88/ 38%. Pollen season causing watery nose.  She took advice from a tv ad and changed her O2 DME to a [employer], but now wants to change back to [facility]. She continues O2 mostly at night. Wearing heart monitor because of tachypalpitation/ Dr Mayford Knife.. Back pain/ DGD limits her acitivity as much as her dyspnea.  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   04/01/11-  70 yoF followed for COPD, sleep apnea, complicated by pulmonary hypertension. Heart monitor results ok- Dr Mayford Knife- for tachypalpitation. Back pain was slowing her down. She got first epidural steroid injection this week.  Dr Michaell Cowing was following for THYROID nodule with hx partial thyroidectomy.  Flu vax talk.  She stays in when hot outdoors, rather than bothering with her portable oxygen. Continues O2 2L [facility] for sleep. Feels she is breathing some better lately, not worse. Says CPAP broke her face out- failed to tolerate.   11/30/11-  70 yoF followed for COPD, sleep apnea, complicated by pulmonary hypertension. PCP Dr Maurice Small, Card- Dr Renelda Mom hospital visit. Hospitalized April 10-14 according to family although discharge summary indicates April 10 through April 18. 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 History of intolerance to CPAP. CODE STATUS during this hospitalization was DO NOT RESUSCITATE. She and her daughter indicate that  she is feeling much better. Now off prednisone, oxygen added.  She describes a new dry cough and is noted to be on an ACE inhibitor which we discussed as one possible cause for the cough. Home oxygen continuous and portable 2 L/[facility]. CXR- 11/04/11- images reviewed IMPRESSION:  Volume loss at the left lung base. Streaky opacity is favored to  reflect atelectasis but infection is difficult to exclude. No  pulmonary edema or pleural effusions.  Original Report Authenticated By: [name], M.D.  ECHO- EF 65-70%. Grade 2 diastolic dysfunction. Mild aortic valve regurgitation. Mild to moderate mitral stenosis with mild mitral regurgitation. Left atrium mildly dilated.  04/03/12-  70 yoF followed for COPD, sleep apnea/ failed CPAP, complicated by pulmonary hypertension. PCP Dr Maurice Small, Card- Dr Mayford Knife Breathing at baseline, using O2 2L/ [facility] for sleep. She notices that her upper eyelids get red without irritation of the globes. She has been having fall season related nasal congestion. Using no regular medication, just her oxygen. She failed to tolerate CPAP and just wears oxygen.  08/27/12- 71 yoF followed for COPD, sleep apnea/ failed CPAP, complicated by pulmonary hypertension. PCP Dr Maurice Small, Card- Dr Donneta Romberg FOR: still SOB at times; coughs when feels nose is running Blames cough on post nasal drip noted to "allergy". Benadryl over dries and makes head itch. Wearing oxygen 2 L/[facility] for sleep. Little cough. No chest pain, swelling, palpitation or blood.  10/26/12-  71 yoF followed for COPD, sleep apnea/ failed CPAP, complicated by pulmonary hypertension. PCP Dr Maurice Small, Card- Dr Mayford Knife   Family here. Follow up to discuss back surgery.  Reports breathing has worsened over  the past week.  Has SOB with very little activity, chest tightness with SOB, runny nose with clear drainage, and nonprod cough.   Gradually increasing dyspnea on exertion, limited as much by  weakness. Some cough, no wheeze, nose drips. No chest pain. Allegra helps rhinitis. Continuous O2 2L/min/ [facility].  Feet swell, left more than right-chronic, dependent. No calf pain and no acute change. No new heart problems/Dr. Turner/[facility].  Review of Systems- see HPI Constitutional:   No weight loss, night sweats, Fevers, chills, fatigue, lassitude. HEENT:   No headaches,  Difficulty swallowing,  Tooth/dental problems, Sore throat,                No-sneezing, itching, ear ache, nasal congestion, CV:  No chest pain, orthopnea, PND, +swelling in lower extremities, no-anasarca, dizziness, palpitations GI  No heartburn, indigestion, abdominal pain, nausea, vomiting, diarrhea,  Resp:   No excess mucus, no productive cough,  + non-productive cough,  No coughing up of blood.  No change in color of mucus.  No wheezing.   Skin: no rash or lesions. GU: . MS:  No joint pain or swelling.   Psych:  No change in mood or affect. No depression or anxiety.  No memory loss.  Objective:   Physical Exam BP 140/78  Pulse 69  Ht 5\' 6"  (1.676 m)  Wt 216 lb (97.977 kg)  BMI 34.88 kg/m2  SpO2 91% General- Alert, Oriented, Affect-appropriate, Distress- none acute   Obese. On O2 2L. Wheelchair. Skin- rash-none, lesions- none, excoriation- none Lymphadenopathy- none Head- atraumatic            Eyes- Gross vision intact, PERRLA, conjunctivae clear secretions            Ears- Hearing, canals normal            Nose- Clear, No-septal dev, mucus, polyps, erosion, perforation             Throat- Mallampati III , mucosa clear/ dry , drainage- none, tonsils- atrophic. Large tongue. Neck- flexible , trachea midline, no stridor , thyroid nl, carotid no bruit Chest - symmetrical excursion , unlabored           Heart/CV- RRR , no murmur heard today , no gallop  , no rub, nl s1 s2                           - JVD- none , edema+1-2, L>R, stasis changes- none, varices- none.  Homan's NEG.           Lung- +somewhat  breathless during conversation at rest. Clear to P&A, wheeze- none, cough- none , dullness-none, rub- none           Chest wall-  Abd-  Br/ Gen/ Rectal- Not done, not indicated Extrem- cyanosis- none, clubbing, none, atrophy- none, strength- nl Neuro- grossly intact to observation  Assessment & Plan:

## 2012-10-29 ENCOUNTER — Telehealth: Payer: Self-pay | Admitting: Internal Medicine

## 2012-10-29 NOTE — Telephone Encounter (Signed)
Spoke with patient,made her aware of results as listed below per Dr. Maple Hudson. Verbalized understanding and nothing further needed at this time.  Result Note    CXR- heart remains enlarged. No Acute or active process.

## 2012-10-29 NOTE — Progress Notes (Signed)
Quick Note:  Spoke with patient,made her aware of results as listed below per Dr. Maple Hudson. Verbalized understanding and nothing further needed at this time. ______

## 2012-10-30 NOTE — Assessment & Plan Note (Signed)
Failed to tolerate CPAP mask. She may need to use BiPAP especially for nocturnal respiratory support after surgery.

## 2012-10-30 NOTE — Assessment & Plan Note (Signed)
Severe obstructive airways disease. Chronic hypoxic respiratory failure. Oxygen dependent. Plan-chest x-ray, Spiriva.  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  Risk assessment: She is at definite increased risk for cardiopulmonary complications of general anesthesia and surgery.  There is little reversible pulmonary disease and no acute process is identified to explain her reported increase in dyspnea on exertion in recent weeks.

## 2012-10-30 NOTE — Assessment & Plan Note (Addendum)
Treated with supplemental oxygen. Probable component of cor pulmonale resulting in peripheral edema. I considered a do not think she has venous thromboembolic disease at this time.

## 2012-12-04 ENCOUNTER — Ambulatory Visit (INDEPENDENT_AMBULATORY_CARE_PROVIDER_SITE_OTHER): Payer: Medicare Other | Admitting: Internal Medicine

## 2012-12-04 ENCOUNTER — Telehealth: Payer: Self-pay | Admitting: Internal Medicine

## 2012-12-04 ENCOUNTER — Encounter: Payer: Self-pay | Admitting: Internal Medicine

## 2012-12-04 VITALS — BP 120/76 | HR 80 | Ht 66.0 in | Wt 213.6 lb

## 2012-12-04 DIAGNOSIS — J439 Emphysema, unspecified: Secondary | ICD-10-CM

## 2012-12-04 DIAGNOSIS — J302 Other seasonal allergic rhinitis: Secondary | ICD-10-CM

## 2012-12-04 DIAGNOSIS — J438 Other emphysema: Secondary | ICD-10-CM

## 2012-12-04 DIAGNOSIS — J9600 Acute respiratory failure, unspecified whether with hypoxia or hypercapnia: Secondary | ICD-10-CM

## 2012-12-04 DIAGNOSIS — J309 Allergic rhinitis, unspecified: Secondary | ICD-10-CM

## 2012-12-04 DIAGNOSIS — J069 Acute upper respiratory infection, unspecified: Secondary | ICD-10-CM

## 2012-12-04 MED ORDER — PHENYLEPHRINE HCL 1 % NA SOLN
3.0000 [drp] | Freq: Once | NASAL | Status: AC
  Administered 2012-12-04: 3 [drp] via NASAL

## 2012-12-04 MED ORDER — PROMETHAZINE-CODEINE 6.25-10 MG/5ML PO SYRP: 2.5000 mL | ORAL_SOLUTION | Freq: Four times a day (QID) | ORAL | Status: DC | PRN

## 2012-12-04 MED ORDER — METHYLPREDNISOLONE ACETATE 80 MG/ML IJ SUSP
80.0000 mg | Freq: Once | INTRAMUSCULAR | Status: AC
  Administered 2012-12-04: 80 mg via INTRAMUSCULAR

## 2012-12-04 MED ORDER — AZITHROMYCIN 250 MG PO TABS: ORAL_TABLET | ORAL | Status: DC

## 2012-12-04 NOTE — Patient Instructions (Addendum)
Neb neo nasal  Depo 80  Script for Z pak antibiotic  Script for cough syrup

## 2012-12-04 NOTE — Assessment & Plan Note (Signed)
Continues O2 2L/[facility]

## 2012-12-04 NOTE — Progress Notes (Signed)
Patient ID: [name], female    DOB: 05-12-1941, 72 y.o.   MRN: [id number]  HPI 11/26/10-SATURATION QUALIFICATIONS: Patient Saturations on Room Air while Ambulating = 87% Patient Saturations on 2 Liters of oxygen while Ambulating = 96%[name],CMA   11/26/10 HPI: 72 yoF followed for COPD, sleep apnea, complicated by pulmonary hypertension. Last here July 29, 2010. PFT reviewed then- severe COPD FEV1 0.88/ 38%. Pollen season causing watery nose.  She took advice from a tv ad and changed her O2 DME to a [employer], but now wants to change back to [facility]. She continues O2 mostly at night. Wearing heart monitor because of tachypalpitation/ Dr Mayford Knife.. Back pain/ DGD limits her acitivity as much as her dyspnea.  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   04/01/11-  72 yoF followed for COPD, sleep apnea, complicated by pulmonary hypertension. Heart monitor results ok- Dr Mayford Knife- for tachypalpitation. Back pain was slowing her down. She got first epidural steroid injection this week.  Dr Michaell Cowing was following for THYROID nodule with hx partial thyroidectomy.  Flu vax talk.  She stays in when hot outdoors, rather than bothering with her portable oxygen. Continues O2 2L [facility] for sleep. Feels she is breathing some better lately, not worse. Says CPAP broke her face out- failed to tolerate.   11/30/11-  72 yoF followed for COPD, sleep apnea, complicated by pulmonary hypertension. PCP Dr Maurice Small, Card- Dr Renelda Mom hospital visit. Hospitalized April 10-14 according to family although discharge summary indicates April 10 through April 18. 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 History of intolerance to CPAP. CODE STATUS during this hospitalization was DO NOT RESUSCITATE. She and her daughter indicate that  she is feeling much better. Now off prednisone, oxygen added.  She describes a new dry cough and is noted to be on an ACE inhibitor which we discussed as one possible cause for the cough. Home oxygen continuous and portable 2 L/[facility]. CXR- 11/04/11- images reviewed IMPRESSION:  Volume loss at the left lung base. Streaky opacity is favored to  reflect atelectasis but infection is difficult to exclude. No  pulmonary edema or pleural effusions.  Original Report Authenticated By: [name], M.D.  ECHO- EF 65-70%. Grade 2 diastolic dysfunction. Mild aortic valve regurgitation. Mild to moderate mitral stenosis with mild mitral regurgitation. Left atrium mildly dilated.  04/03/12-  72 yoF followed for COPD, sleep apnea/ failed CPAP, complicated by pulmonary hypertension. PCP Dr Maurice Small, Card- Dr Mayford Knife Breathing at baseline, using O2 2L/ [facility] for sleep. She notices that her upper eyelids get red without irritation of the globes. She has been having fall season related nasal congestion. Using no regular medication, just her oxygen. She failed to tolerate CPAP and just wears oxygen.  08/27/12- 72 yoF followed for COPD, sleep apnea/ failed CPAP, complicated by pulmonary hypertension. PCP Dr Maurice Small, Card- Dr Donneta Romberg FOR: still SOB at times; coughs when feels nose is running Blames cough on post nasal drip noted to "allergy". Benadryl over dries and makes head itch. Wearing oxygen 2 L/[facility] for sleep. Little cough. No chest pain, swelling, palpitation or blood.  10/26/12-  72 yoF followed for COPD, sleep apnea/ failed CPAP, complicated by pulmonary hypertension. PCP Dr Maurice Small, Card- Dr Mayford Knife   Family here. Follow up to discuss back surgery.  Reports breathing has worsened over  the past week.  Has SOB with very little activity, chest tightness with SOB, runny nose with clear drainage, and nonprod cough.   Gradually increasing dyspnea on exertion, limited as much by  weakness. Some cough, no wheeze, nose drips. No chest pain. Allegra helps rhinitis. Continuous O2 2L/min/ [facility].  Feet swell, left more than right-chronic, dependent. No calf pain and no acute change. No new heart problems/Dr. Turner/[facility].  12/04/12- 72 yoF followed for COPD, sleep apnea/ failed CPAP, complicated by pulmonary hypertension. PCP Dr Maurice Small, Card- Dr Mayford Knife     Daughter here ACUTE VISIT: runny nose, sore throat, cough-gagging-non productive, chills. Son has recently been sick.  Increased SOB as well. 1 week frontal headache, watery rhinorhea, scratchy throat, dry cough. Began w/ fever/ chill. O2 2L/ [facility] CXR 10/29/12 Findings: The heart is again enlarged in size. Postsurgical  changes are again seen. The lungs are well-aerated bilaterally  without focal confluent infiltrate. No sizable effusion is noted.  IMPRESSION:  No acute abnormalities seen.  Original Report Authenticated By: [name], M.D.  Review of Systems- see HPI Constitutional:   No weight loss, night sweats, +Fevers, chills,No- fatigue, lassitude. HEENT:   + headaches,  No-Difficulty swallowing,  Tooth/dental problems, Sore throat,                No-sneezing, itching, ear ache, nasal congestion, CV:  No chest pain, orthopnea, PND, +swelling in lower extremities, no-anasarca, dizziness, palpitations GI  No heartburn, indigestion, abdominal pain, nausea, vomiting, ,  Resp:   No excess mucus, no productive cough,  + non-productive cough,  No coughing up of blood.  No change in color of mucus.  No wheezing.   Skin: no rash or lesions. GU: . MS:  No joint pain or swelling.   Psych:  No change in mood or affect. No depression or anxiety.  No memory loss.  Objective:   Physical Exam  General- Alert, Oriented, Affect-appropriate, Distress- none acute   Obese. On O2 2L. Wheelchair. Skin- rash-none, lesions- none, excoriation- none Lymphadenopathy- none Head- atraumatic            Eyes- Gross vision  intact, PERRLA, conjunctivae clear secretions            Ears- Hearing, canals normal            Nose- Clear, No-septal dev, mucus, polyps, erosion, perforation             Throat- Mallampati III , mucosa clear/ dry , drainage- none, tonsils- atrophic. Large tongue. Neck- flexible , trachea midline, no stridor , thyroid nl, carotid no bruit Chest - symmetrical excursion , unlabored           Heart/CV- RRR , no murmur heard today , no gallop  , no rub, nl s1 s2                           - JVD- none , edema+trace, stasis changes- none, varices- none.  Homan's NEG.           Lung- +loose cough. Clear to P&A, wheeze- none , dullness-none, rub- none           Chest wall-  Abd-  Br/ Gen/ Rectal- Not done, not indicated Extrem- cyanosis- none, clubbing, none, atrophy- none, strength- nl Neuro- grossly intact to observation  Assessment & Plan:

## 2012-12-04 NOTE — Assessment & Plan Note (Signed)
Upper respiratory infection likely caught from son. Some tracheitis but not yet much bronchitis. She is about to leave for beach trip- discussed rest, avoid chilling, etc.  Plan- Zpak, Nasal neb decongestant, depomedrol

## 2012-12-04 NOTE — Telephone Encounter (Signed)
Spoke with patient States she started off with sore throat and coughing, irritated throat x 1 day Overnight into this morning she started having chills and fever Prod cough with pale yellow mucus States her son recently got over the same thing Pt requesting to be seen today Appear 3pm slot has just become available and patient has been placed in this slot to be seen Patient aware and nothing further needed at this time/

## 2012-12-19 ENCOUNTER — Telehealth: Payer: Self-pay | Admitting: Internal Medicine

## 2012-12-19 ENCOUNTER — Encounter: Payer: Self-pay | Admitting: Internal Medicine

## 2012-12-19 NOTE — Telephone Encounter (Signed)
Spoke with patient-states that Dr Noel Gerold will need a surgery clearance letter stating okay for back surgery Fax to [phone]. Pt states not an invasive surgery-will use local numbing agent and place spring in back area. Please advise.

## 2012-12-20 NOTE — Telephone Encounter (Signed)
Letter done and faxed to Dr Noel Gerold; also mailed hard copy to patient. Pt is aware. Nothing more needed at this time. Will sign off on message.

## 2013-02-25 ENCOUNTER — Encounter: Payer: Self-pay | Admitting: Internal Medicine

## 2013-02-25 ENCOUNTER — Ambulatory Visit (INDEPENDENT_AMBULATORY_CARE_PROVIDER_SITE_OTHER): Payer: Medicare Other | Admitting: Internal Medicine

## 2013-02-25 ENCOUNTER — Other Ambulatory Visit (INDEPENDENT_AMBULATORY_CARE_PROVIDER_SITE_OTHER): Payer: Medicare Other

## 2013-02-25 VITALS — BP 96/56 | HR 98 | Ht 66.0 in

## 2013-02-25 DIAGNOSIS — J439 Emphysema, unspecified: Secondary | ICD-10-CM

## 2013-02-25 DIAGNOSIS — G4733 Obstructive sleep apnea (adult) (pediatric): Secondary | ICD-10-CM

## 2013-02-25 DIAGNOSIS — I2789 Other specified pulmonary heart diseases: Secondary | ICD-10-CM

## 2013-02-25 DIAGNOSIS — I2510 Atherosclerotic heart disease of native coronary artery without angina pectoris: Secondary | ICD-10-CM

## 2013-02-25 DIAGNOSIS — J438 Other emphysema: Secondary | ICD-10-CM

## 2013-02-25 LAB — CBC WITH DIFFERENTIAL/PLATELET
Basophils Absolute: 0.1 10*3/uL (ref 0.0–0.1)
Eosinophils Relative: 3.6 % (ref 0.0–5.0)
Hemoglobin: 11.2 g/dL — ABNORMAL LOW (ref 12.0–15.0)
Lymphocytes Relative: 21 % (ref 12.0–46.0)
Monocytes Relative: 11.1 % (ref 3.0–12.0)
Neutro Abs: 5.1 10*3/uL (ref 1.4–7.7)
RBC: 3.85 Mil/uL — ABNORMAL LOW (ref 3.87–5.11)
RDW: 15.3 % — ABNORMAL HIGH (ref 11.5–14.6)
WBC: 8 10*3/uL (ref 4.5–10.5)

## 2013-02-25 LAB — BASIC METABOLIC PANEL
CO2: 38 mEq/L — ABNORMAL HIGH (ref 19–32)
Chloride: 94 mEq/L — ABNORMAL LOW (ref 96–112)
Creatinine, Ser: 0.8 mg/dL (ref 0.4–1.2)
Glucose, Bld: 94 mg/dL (ref 70–99)

## 2013-02-25 NOTE — Assessment & Plan Note (Signed)
Probably excessive dehydration from high dose Lasix. Plan-reduce Lasix to 1/2x80 mg daily until seen by her primary physician again Chemistry panel

## 2013-02-25 NOTE — Assessment & Plan Note (Signed)
She is comfortable, willing to consider trying CPAP again.

## 2013-02-25 NOTE — Assessment & Plan Note (Signed)
Continues oxygen 2 L/[facility] 

## 2013-02-25 NOTE — Patient Instructions (Addendum)
Order- lab- CBC w/ diff, BMET, D-dimer      Dx dyspnea  Break your furosemide 80 mg tabs in half, and only take 1/2 tab (40 mg) daily until you see Dr Mayford Knife- make early appointment back with her.

## 2013-02-25 NOTE — Progress Notes (Signed)
Patient ID: [name], female    DOB: 05-12-1941, 72 y.o.   MRN: [id number]  HPI 11/26/10-SATURATION QUALIFICATIONS: Patient Saturations on Room Air while Ambulating = 87% Patient Saturations on 2 Liters of oxygen while Ambulating = 96%[name],CMA   11/26/10 HPI: 72 yoF followed for COPD, sleep apnea, complicated by pulmonary hypertension. Last here July 29, 2010. PFT reviewed then- severe COPD FEV1 0.88/ 38%. Pollen season causing watery nose.  She took advice from a tv ad and changed her O2 DME to a [employer], but now wants to change back to [facility]. She continues O2 mostly at night. Wearing heart monitor because of tachypalpitation/ Dr Mayford Knife.. Back pain/ DGD limits her acitivity as much as her dyspnea.  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   04/01/11-  72 yoF followed for COPD, sleep apnea, complicated by pulmonary hypertension. Heart monitor results ok- Dr Mayford Knife- for tachypalpitation. Back pain was slowing her down. She got first epidural steroid injection this week.  Dr Michaell Cowing was following for THYROID nodule with hx partial thyroidectomy.  Flu vax talk.  She stays in when hot outdoors, rather than bothering with her portable oxygen. Continues O2 2L [facility] for sleep. Feels she is breathing some better lately, not worse. Says CPAP broke her face out- failed to tolerate.   11/30/11-  72 yoF followed for COPD, sleep apnea, complicated by pulmonary hypertension. PCP Dr Maurice Small, Card- Dr Renelda Mom hospital visit. Hospitalized April 10-14 according to family although discharge summary indicates April 10 through April 18. 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 History of intolerance to CPAP. CODE STATUS during this hospitalization was DO NOT RESUSCITATE. She and her daughter indicate that  she is feeling much better. Now off prednisone, oxygen added.  She describes a new dry cough and is noted to be on an ACE inhibitor which we discussed as one possible cause for the cough. Home oxygen continuous and portable 2 L/Advanced. CXR- 11/04/11- images reviewed IMPRESSION:  Volume loss at the left lung base. Streaky opacity is favored to  reflect atelectasis but infection is difficult to exclude. No  pulmonary edema or pleural effusions.  Original Report Authenticated By: [name], M.D.  ECHO- EF 65-70%. Grade 2 diastolic dysfunction. Mild aortic valve regurgitation. Mild to moderate mitral stenosis with mild mitral regurgitation. Left atrium mildly dilated.  04/03/12-  72 yoF followed for COPD, sleep apnea/ failed CPAP, complicated by pulmonary hypertension. PCP Dr Maurice Small, Card- Dr Mayford Knife Breathing at baseline, using O2 2L/ [facility] for sleep. She notices that her upper eyelids get red without irritation of the globes. She has been having fall season related nasal congestion. Using no regular medication, just her oxygen. She failed to tolerate CPAP and just wears oxygen.  08/27/12- 72 yoF followed for COPD, sleep apnea/ failed CPAP, complicated by pulmonary hypertension. PCP Dr Maurice Small, Card- Dr Donneta Romberg FOR: still SOB at times; coughs when feels nose is running Blames cough on post nasal drip noted to "allergy". Benadryl over dries and makes head itch. Wearing oxygen 2 L/Advanced for sleep. Little cough. No chest pain, swelling, palpitation or blood.  10/26/12-  72 yoF followed for COPD, sleep apnea/ failed CPAP, complicated by pulmonary hypertension. PCP Dr Maurice Small, Card- Dr Mayford Knife   Family here. Follow up to discuss back surgery.  Reports breathing has worsened over  the past week.  Has SOB with very little activity, chest tightness with SOB, runny nose with clear drainage, and nonprod cough.   Gradually increasing dyspnea on exertion, limited as much by  weakness. Some cough, no wheeze, nose drips. No chest pain. Allegra helps rhinitis. Continuous O2 2L/min/ [facility].  Feet swell, left more than right-chronic, dependent. No calf pain and no acute change. No new heart problems/Dr. Turner/[facility].  12/04/12- 72 yoF followed for COPD, sleep apnea/ failed CPAP, complicated by pulmonary hypertension. PCP Dr Maurice Small, Card- Dr Mayford Knife     Daughter here ACUTE VISIT: runny nose, sore throat, cough-gagging-non productive, chills. Son has recently been sick.  Increased SOB as well. 1 week frontal headache, watery rhinorhea, scratchy throat, dry cough. Began w/ fever/ chill. O2 2L/ Advanced CXR 10/29/12 Findings: The heart is again enlarged in size. Postsurgical  changes are again seen. The lungs are well-aerated bilaterally  without focal confluent infiltrate. No sizable effusion is noted.  IMPRESSION:  No acute abnormalities seen.  Original Report Authenticated By: [name], M.D.  02/25/13- 72 yoF followed for COPD, sleep apnea/ failed CPAP, complicated by pulmonary hypertension, CAD/ Hx MI. PCP Dr Maurice Small, Card- Dr Mayford Knife     Daughter here c/o feeling fatigued, dizziness,  and increased DOE x 2 wks.  No cough, wheezing, chest tightness, or chest pain noted at this time. Note med list includes Spiriva and [place]. O2 2L/ Advanced She thinks she has been getting too much Lasix, 80 mg daily. Dr. Mayford Knife is away and her office wants to send her to ER. Frequent bathroom trips, orthostatic. Not taking either Tudorza or Spiriva.no infection. Back surgery with no respiratory problem June.  Review of Systems- see HPI Constitutional:   + weight loss, night sweats, +Fevers, chills,No- fatigue, lassitude. HEENT:   + headaches,  No-Difficulty swallowing,  Tooth/dental problems, Sore throat,                No-sneezing, itching, ear ache, nasal congestion, CV:  No chest pain, orthopnea, PND, +swelling in lower extremities, no-anasarca, dizziness,  palpitations GI  No heartburn, indigestion, abdominal pain, nausea, vomiting, ,  Resp:   No excess mucus, no productive cough,  + non-productive cough,  No coughing up of blood.  No change in color of mucus.  No wheezing.   Skin: no rash or lesions. GU: . MS:  No joint pain or swelling.   Psych:  No change in mood or affect. No depression or anxiety.  No memory loss.  Objective:   Physical Exam  General- +Alert, Oriented, Affect-appropriate, Distress- none acute   Obese. On O2 2L. Wheelchair. Skin- rash-none, lesions- none, excoriation- none Lymphadenopathy- none Head- atraumatic            Eyes- Gross vision intact, PERRLA, conjunctivae clear secretions            Ears- Hearing, canals normal            Nose- Clear, No-septal dev, mucus, polyps, erosion, perforation             Throat- Mallampati III , mucosa clear/ dry , drainage- none, tonsils- atrophic. Large tongue. Neck- flexible , trachea midline, no stridor , thyroid nl, carotid no bruit Chest - symmetrical excursion , unlabored           Heart/CV- Rapid RR , no murmur heard today , no gallop  , no rub, nl s1 s2                           -  JVD- none , edema-none, stasis changes- none, varices- none.  Homan's NEG.           Lung- +few crackles base. Clear to P&A, wheeze- none , dullness-none, rub- none           Chest wall-  Abd-  Br/ Gen/ Rectal- Not done, not indicated Extrem- cyanosis- none, clubbing, none, atrophy- none, strength- nl Neuro- grossly intact to observation  Assessment & Plan:

## 2013-03-01 NOTE — Progress Notes (Signed)
Quick Note:  Pt aware of results. ______ 

## 2013-04-20 ENCOUNTER — Other Ambulatory Visit: Payer: Self-pay | Admitting: Cardiology

## 2013-04-20 DIAGNOSIS — Z79899 Other long term (current) drug therapy: Secondary | ICD-10-CM

## 2013-04-20 DIAGNOSIS — E7800 Pure hypercholesterolemia, unspecified: Secondary | ICD-10-CM

## 2013-05-09 ENCOUNTER — Ambulatory Visit (INDEPENDENT_AMBULATORY_CARE_PROVIDER_SITE_OTHER): Payer: Medicare Other

## 2013-05-09 ENCOUNTER — Other Ambulatory Visit (INDEPENDENT_AMBULATORY_CARE_PROVIDER_SITE_OTHER): Payer: Medicare Other

## 2013-05-09 DIAGNOSIS — E7800 Pure hypercholesterolemia, unspecified: Secondary | ICD-10-CM

## 2013-05-09 DIAGNOSIS — Z79899 Other long term (current) drug therapy: Secondary | ICD-10-CM

## 2013-05-09 DIAGNOSIS — Z23 Encounter for immunization: Secondary | ICD-10-CM

## 2013-05-09 LAB — LIPID PANEL
Cholesterol: 237 mg/dL — ABNORMAL HIGH (ref 0–200)
Total CHOL/HDL Ratio: 4
Triglycerides: 134 mg/dL (ref 0.0–149.0)
VLDL: 26.8 mg/dL (ref 0.0–40.0)

## 2013-05-10 ENCOUNTER — Ambulatory Visit (INDEPENDENT_AMBULATORY_CARE_PROVIDER_SITE_OTHER): Payer: Medicare Other | Admitting: Cardiology

## 2013-05-10 ENCOUNTER — Encounter: Payer: Self-pay | Admitting: Cardiology

## 2013-05-10 VITALS — BP 120/68 | HR 64 | Ht 66.0 in | Wt 209.0 lb

## 2013-05-10 DIAGNOSIS — I2510 Atherosclerotic heart disease of native coronary artery without angina pectoris: Secondary | ICD-10-CM

## 2013-05-10 DIAGNOSIS — Z7901 Long term (current) use of anticoagulants: Secondary | ICD-10-CM | POA: Insufficient documentation

## 2013-05-10 DIAGNOSIS — I4892 Unspecified atrial flutter: Secondary | ICD-10-CM

## 2013-05-10 DIAGNOSIS — I10 Essential (primary) hypertension: Secondary | ICD-10-CM

## 2013-05-10 DIAGNOSIS — I5032 Chronic diastolic (congestive) heart failure: Secondary | ICD-10-CM

## 2013-05-10 DIAGNOSIS — E785 Hyperlipidemia, unspecified: Secondary | ICD-10-CM

## 2013-05-10 MED ORDER — RIVAROXABAN 20 MG PO TABS: 20.0000 mg | ORAL_TABLET | Freq: Every day | ORAL | Status: DC

## 2013-05-10 NOTE — Patient Instructions (Signed)
Please Stop taking Aspirin 81 Mg daily  Restart Xarelto 20 MG. We gave you samples today in office.  We are referring you to the lipid clinic today  Your physician wants you to follow-up in: 6 months with Dr. Mayford Knife.  You will receive a reminder letter in the mail two months in advance. If you don't receive a letter, please call our office to schedule the follow-up appointment.

## 2013-05-10 NOTE — Progress Notes (Signed)
[address] [place], [place]  [address] Phone: [phone] Fax:  [phone]  Date:  05/10/2013   ID:  [name], DOB Dec 08, 1940, MRN [id number]  PCP:  Astrid Divine, MD  Cardiologist:  Armanda Magic, MD     History of Present Illness: [name] is a 72 y.o. female with a history of chronic diastolic CHF, moderate pulmonary HTN, HTN, CAD and dyslipidemia who presents today to discuss her medications.  She was on lipitor but had severe muscle aches and stopped her Lipitor and her muscle aches resolved.  She stopped the lipitor a month ago and just had lipids checked yesterday.  She also wants to stop the Xarelto because she does not like the side effects associated with it.  She has already stopped the Xarelto and went back on the baby ASA.    Wt Readings from Last 3 Encounters:  05/10/13 209 lb (94.802 kg)  12/04/12 213 lb 9.6 oz (96.888 kg)  10/26/12 216 lb (97.977 kg)     Past Medical History  Diagnosis Date  . COPD (chronic obstructive pulmonary disease)   . Pulmonary HTN     PASP 50-82mmHg by XLKG4/0102  . H/O: rheumatic fever   . History of rheumatic heart disease     X 3-LAST TIME WHEN PATIENT WAS 72 YRS OLD  . Sleep apnea     intolerant to CPAP  . Hypertension   . H/O right bundle branch block   . Bursitis, shoulder     bilateral  . GERD (gastroesophageal reflux disease)   . H/O hiatal hernia   . Anxiety   . Depression   . Varicose veins   . PUD (peptic ulcer disease)   . 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 DJD (degenerative joint disease)   . OA (osteoarthritis)   . Hyperlipidemia   . Urinary incontinence   . Pulmonary HTN   . Thyroid nodule   . CHF (congestive heart failure)     chronic diasotlic CHF    Current Outpatient Prescriptions  Medication Sig Dispense Refill  . aspirin 81 MG tablet Take 81 mg by mouth daily.        . B-12, Methylcobalamin, 1000 MCG SUBL Place under the tongue.      . calcium-vitamin D  (OSCAL WITH D) 250-125 MG-UNIT per tablet Take 1 tablet by mouth daily.      . digoxin (LANOXIN) 0.125 MG tablet Take 0.125 mg by mouth daily.      . fexofenadine (ALLEGRA) 180 MG tablet Take 180 mg by mouth daily.      . furosemide (LASIX) 40 MG tablet Take 80 mg by mouth daily.       Marland Kitchen KLOR-CON M20 20 MEQ tablet Take 1 tablet by mouth daily.       [facility] Lanius Omega-3 300 MG CAPS Take by mouth.      . metoprolol (LOPRESSOR) 50 MG tablet Take 50 mg by mouth 2 (two) times daily.        Marland Kitchen omeprazole (PRILOSEC OTC) 20 MG tablet Take 20 mg by mouth daily.      . sertraline (ZOLOFT) 100 MG tablet Take 100 mg by mouth daily.         No current facility-administered medications for this visit.    Allergies:    Allergies  Allergen Reactions  . Prednisone     jittery  . Xopenex [Levalbuterol]     Made mouth  and throat sore  . Codeine   . Lipitor [Atorvastatin]     Muscle aches  . Ventolin [Kdc:Albuterol]     "jittery"    Social History:  The patient  reports that she quit smoking about 5 years ago. Her smoking use included Cigarettes. She has a 47 pack-year smoking history. She has never used smokeless tobacco. She reports that she does not drink alcohol or use illicit drugs.   Family History:  The patient's family history includes Anemia in her mother; Emphysema in her father; Esophageal cancer in her daughter.   ROS:  Please see the history of present illness.      All other systems reviewed and negative.   PHYSICAL EXAM: VS:  BP 120/68  Pulse 64  Ht 5\' 6"  (1.676 m)  Wt 209 lb (94.802 kg)  BMI 33.75 kg/m2 Well nourished, well developed, in no acute distress HEENT: normal Neck: no JVD Cardiac:  normal S1, S2; RRR; 1/6 SM at RUSB Lungs:  clear to auscultation bilaterally, no wheezing, rhonchi or rales Abd: soft, nontender, no hepatomegaly Ext: no edema Skin: warm and dry Neuro:  CNs 2-12 intact, no focal abnormalities noted       ASSESSMENT AND PLAN:  1. Dyslipidemia - now  off lipitor with LDL 160 yesterday so she needs to be on some type of lipid lowering agent.  - I will schedule her for our lipid clinic for evaluation of other lipid lowering agents 2. Chronic diastolic CHF - well compensated  - continue Lasix/potassium 3. HTN - well controlled  - continue Metoprolol 4. CAD with chronic CP that she thinks is due to indigestion and gas with recent nuclear stress test showing no ischemia. 5.  Paroxysmal atrial flutter  - Her CHADS VASC score is 4 (age > 77, female, HTN, CHF) and I discussed with her the increased risk of CVA with PAflutter.  I have recommended that she go back on the Xarelto and stop ASA.  Followup with me in 6 months    Signed, Armanda Magic, MD 05/10/2013 11:38 AM

## 2013-05-14 ENCOUNTER — Ambulatory Visit: Payer: Medicare Other | Admitting: Pharmacist

## 2013-05-15 ENCOUNTER — Ambulatory Visit (INDEPENDENT_AMBULATORY_CARE_PROVIDER_SITE_OTHER): Payer: Medicare Other | Admitting: Pharmacist

## 2013-05-15 VITALS — Wt 209.0 lb

## 2013-05-15 DIAGNOSIS — E785 Hyperlipidemia, unspecified: Secondary | ICD-10-CM

## 2013-05-15 DIAGNOSIS — Z79899 Other long term (current) drug therapy: Secondary | ICD-10-CM

## 2013-05-15 MED ORDER — SIMVASTATIN 20 MG PO TABS: 20.0000 mg | ORAL_TABLET | Freq: Every evening | ORAL | Status: DC

## 2013-05-15 MED ORDER — CO-ENZYME Q10 200 MG PO CAPS: 200.0000 mg | ORAL_CAPSULE | Freq: Every day | ORAL | Status: DC

## 2013-05-15 NOTE — Progress Notes (Signed)
RF: CAD (CABG x 4), HTN, age - LDL goal < 70, non-HDL goal < 100. Meds: Off lipitor for past month due to muscle aches (these have improved off lipitor, but still present).  HPI:  Patient feels her muscle and joint aches may be coming somewhat from her back problems.  She has had back surgery recently by Dr. Noel Gerold for a bulging disc.   LDL now back up to 160 mg/dL off lipitor. Previously LDL was 58 mg/dL on Lipitor 80 mg qd, and ~ 80 mg/dL when on simvastatin 80 mg qd. Patient was on simvastatin earlier this year, and tolerated this at 80 mg daily. This was changed to atorvastatin to get something more potent and to avoid max dose simvastatin. Months later she complains of increased soreness in muscles and joints.  She states she had a little of this on simvastatin 80 mg in the past as well, but not as bad.  Social history:  Quit smoking 2010.  No alcohol.  Retired from working in [employer]. Diet:  Eats sausage/eggs five days per week for breakfast.  Eats breakfast bar or yogurt other days.  Patient eats late breakfast, so often skips lunch.  For dinner she eats a meat and vegetable.  Rarely eats out.  Doesn't drink soda.  Drinks sweet tea.   Exercise:  Can't do it due to back and leg pain.  Labs:  04/2013   LDL 160, TC 237, TG 134, HDL 60, non-HDL 177 (not on lipid lowering meds)  Current Outpatient Prescriptions  Medication Sig Dispense Refill  . B-12, Methylcobalamin, 1000 MCG SUBL Place under the tongue.      . calcium-vitamin D (OSCAL WITH D) 250-125 MG-UNIT per tablet Take 1 tablet by mouth daily.      . digoxin (LANOXIN) 0.125 MG tablet Take 0.125 mg by mouth daily.      . fexofenadine (ALLEGRA) 180 MG tablet Take 180 mg by mouth daily.      . furosemide (LASIX) 40 MG tablet Take 80 mg by mouth daily.       Marland Kitchen KLOR-CON M20 20 MEQ tablet Take 1 tablet by mouth daily.       [facility] Lanius Omega-3 300 MG CAPS Take by mouth.      . metoprolol (LOPRESSOR) 50 MG tablet Take 50 mg by mouth  2 (two) times daily.        Marland Kitchen omeprazole (PRILOSEC OTC) 20 MG tablet Take 20 mg by mouth daily.      . Rivaroxaban (XARELTO) 20 MG TABS tablet Take 1 tablet (20 mg total) by mouth daily.  30 tablet    . sertraline (ZOLOFT) 100 MG tablet Take 100 mg by mouth daily.         No current facility-administered medications for this visit.   Allergies  Allergen Reactions  . Prednisone     jittery  . Xopenex [Levalbuterol]     Made mouth and throat sore  . Codeine   . Lipitor [Atorvastatin]     Muscle aches  . Ventolin [Kdc:Albuterol]     "jittery"   Family History  Problem Relation Age of Onset  . Anemia Mother   . Emphysema Father   . Esophageal cancer Daughter

## 2013-05-15 NOTE — Assessment & Plan Note (Addendum)
Because patient's joint and back pain still present, just less, now 4 weeks after being off lipitor, I'm not certain that the statin is the sole source for this pain.  Patient has had disc surgery done on her back and she feels this may be contributing to the pain as well.  Since she has a h/o CABG, would really like to have her on a daily dose of at least a moderate potency statin/dose.  Patient tolerated simvastatin 80 mg in the past.  Given she failed lipitor, and still has some soreness, will have her restart simvastatin at just 20 mg daily, and also have her take Co-enzyme Q-10 200 mg daily as well.   Patient and I discussed dietary modifications, and she is to reduce eggs/sausage to no more than 2 days per week (was 5 days/week), and she is to increase fiber in the morning.  Eat more cereal, fruit, and yogurt would be okay as well.   Exercise not an option given her back.   Recheck lipid / liver in 3 months, and see me the next day in clinic to review.  Crestor could be an option if simvastatin 20 mg not tolerated.

## 2013-05-15 NOTE — Patient Instructions (Signed)
1.  Start Co-Enzyme Q-10 once daily.  Get the 200 mg dose and take 1 pill daily. 2.  Next week, start simvastatin 20 mg once daily.  Take in the evening. 3.  Limit bacon/eggs to no more than twice weekly.  Eat yogurt and fruit, or cereal / fruit on other days. 4.  Recheck blood work in 3 months (08/21/13 in the morning - fasting blood work), then see me Alfonse Ras, PharmD - 08/22/13 11:30) 1 day after blood work.

## 2013-07-01 ENCOUNTER — Ambulatory Visit: Payer: Medicare Other | Admitting: Cardiology

## 2013-08-15 ENCOUNTER — Telehealth: Payer: Self-pay | Admitting: *Deleted

## 2013-08-15 NOTE — Telephone Encounter (Signed)
Patient requests xarelto samples. She is aware that they will be left at the front desk for pick up. 

## 2013-08-21 ENCOUNTER — Other Ambulatory Visit (INDEPENDENT_AMBULATORY_CARE_PROVIDER_SITE_OTHER): Payer: Medicare Other

## 2013-08-21 DIAGNOSIS — Z79899 Other long term (current) drug therapy: Secondary | ICD-10-CM

## 2013-08-21 DIAGNOSIS — E785 Hyperlipidemia, unspecified: Secondary | ICD-10-CM

## 2013-08-21 LAB — LIPID PANEL
CHOLESTEROL: 168 mg/dL (ref 0–200)
HDL: 55.4 mg/dL (ref >39.00)
LDL CALC: 84 mg/dL (ref 0–99)
Total CHOL/HDL Ratio: 3
Triglycerides: 141 mg/dL (ref 0.0–149.0)
VLDL: 28.2 mg/dL (ref 0.0–40.0)

## 2013-08-21 LAB — HEPATIC FUNCTION PANEL
ALT: 14 U/L (ref 0–35)
AST: 16 U/L (ref 0–37)
Albumin: 3.2 g/dL — ABNORMAL LOW (ref 3.5–5.2)
Alkaline Phosphatase: 64 U/L (ref 39–117)
Bilirubin, Direct: 0.1 mg/dL (ref 0.0–0.3)
TOTAL PROTEIN: 7 g/dL (ref 6.0–8.3)
Total Bilirubin: 0.6 mg/dL (ref 0.3–1.2)

## 2013-08-22 ENCOUNTER — Ambulatory Visit: Payer: Medicare Other | Admitting: Pharmacist

## 2013-08-29 ENCOUNTER — Ambulatory Visit (INDEPENDENT_AMBULATORY_CARE_PROVIDER_SITE_OTHER): Payer: Medicare Other | Admitting: Internal Medicine

## 2013-08-29 ENCOUNTER — Encounter: Payer: Self-pay | Admitting: Internal Medicine

## 2013-08-29 ENCOUNTER — Ambulatory Visit (INDEPENDENT_AMBULATORY_CARE_PROVIDER_SITE_OTHER): Payer: Medicare Other | Admitting: Pharmacist

## 2013-08-29 VITALS — BP 140/70 | HR 70 | Ht 66.0 in | Wt 207.4 lb

## 2013-08-29 VITALS — Wt 205.0 lb

## 2013-08-29 DIAGNOSIS — J449 Chronic obstructive pulmonary disease, unspecified: Secondary | ICD-10-CM

## 2013-08-29 DIAGNOSIS — J438 Other emphysema: Secondary | ICD-10-CM

## 2013-08-29 DIAGNOSIS — I2789 Other specified pulmonary heart diseases: Secondary | ICD-10-CM

## 2013-08-29 DIAGNOSIS — Z79899 Other long term (current) drug therapy: Secondary | ICD-10-CM

## 2013-08-29 DIAGNOSIS — E785 Hyperlipidemia, unspecified: Secondary | ICD-10-CM

## 2013-08-29 DIAGNOSIS — J439 Emphysema, unspecified: Secondary | ICD-10-CM

## 2013-08-29 NOTE — Progress Notes (Signed)
Patient ID: [name], female    DOB: 05-12-1941, 73 y.o.   MRN: [id number]  HPI 11/26/10-SATURATION QUALIFICATIONS: Patient Saturations on Room Air while Ambulating = 87% Patient Saturations on 2 Liters of oxygen while Ambulating = 96%[name],CMA   11/26/10 HPI: 73 yoF followed for COPD, sleep apnea, complicated by pulmonary hypertension. Last here July 29, 2010. PFT reviewed then- severe COPD FEV1 0.88/ 38%. Pollen season causing watery nose.  She took advice from a tv ad and changed her O2 DME to a [employer], but now wants to change back to [facility]. She continues O2 mostly at night. Wearing heart monitor because of tachypalpitation/ Dr Mayford Knife.. Back pain/ DGD limits her acitivity as much as her dyspnea.  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   04/01/11-  73 yoF followed for COPD, sleep apnea, complicated by pulmonary hypertension. Heart monitor results ok- Dr Mayford Knife- for tachypalpitation. Back pain was slowing her down. She got first epidural steroid injection this week.  Dr Michaell Cowing was following for THYROID nodule with hx partial thyroidectomy.  Flu vax talk.  She stays in when hot outdoors, rather than bothering with her portable oxygen. Continues O2 2L [facility] for sleep. Feels she is breathing some better lately, not worse. Says CPAP broke her face out- failed to tolerate.   11/30/11-  73 yoF followed for COPD, sleep apnea, complicated by pulmonary hypertension. PCP Dr Maurice Small, Card- Dr Renelda Mom hospital visit. Hospitalized April 10-14 according to family although discharge summary indicates April 10 through April 18. 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 History of intolerance to CPAP. CODE STATUS during this hospitalization was DO NOT RESUSCITATE. She and her daughter indicate that  she is feeling much better. Now off prednisone, oxygen added.  She describes a new dry cough and is noted to be on an ACE inhibitor which we discussed as one possible cause for the cough. Home oxygen continuous and portable 2 L/[facility]. CXR- 11/04/11- images reviewed IMPRESSION:  Volume loss at the left lung base. Streaky opacity is favored to  reflect atelectasis but infection is difficult to exclude. No  pulmonary edema or pleural effusions.  Original Report Authenticated By: [name], M.D.  ECHO- EF 65-70%. Grade 2 diastolic dysfunction. Mild aortic valve regurgitation. Mild to moderate mitral stenosis with mild mitral regurgitation. Left atrium mildly dilated.  04/03/12-  73 yoF followed for COPD, sleep apnea/ failed CPAP, complicated by pulmonary hypertension. PCP Dr Maurice Small, Card- Dr Mayford Knife Breathing at baseline, using O2 2L/ [facility] for sleep. She notices that her upper eyelids get red without irritation of the globes. She has been having fall season related nasal congestion. Using no regular medication, just her oxygen. She failed to tolerate CPAP and just wears oxygen.  08/27/12- 73 yoF followed for COPD, sleep apnea/ failed CPAP, complicated by pulmonary hypertension. PCP Dr Maurice Small, Card- Dr Donneta Romberg FOR: still SOB at times; coughs when feels nose is running Blames cough on post nasal drip noted to "allergy". Benadryl over dries and makes head itch. Wearing oxygen 2 L/[facility] for sleep. Little cough. No chest pain, swelling, palpitation or blood.  10/26/12-  73 yoF followed for COPD, sleep apnea/ failed CPAP, complicated by pulmonary hypertension. PCP Dr Maurice Small, Card- Dr Mayford Knife   Family here. Follow up to discuss back surgery.  Reports breathing has worsened over  the past week.  Has SOB with very little activity, chest tightness with SOB, runny nose with clear drainage, and nonprod cough.   Gradually increasing dyspnea on exertion, limited as much by  weakness. Some cough, no wheeze, nose drips. No chest pain. Allegra helps rhinitis. Continuous O2 2L/min/ [facility].  Feet swell, left more than right-chronic, dependent. No calf pain and no acute change. No new heart problems/Dr. Turner/[facility].  12/04/12- 73 yoF followed for COPD, sleep apnea/ failed CPAP, complicated by pulmonary hypertension. PCP Dr Maurice Small, Card- Dr Mayford Knife     Daughter here ACUTE VISIT: runny nose, sore throat, cough-gagging-non productive, chills. Son has recently been sick.  Increased SOB as well. 1 week frontal headache, watery rhinorhea, scratchy throat, dry cough. Began w/ fever/ chill. O2 2L/ [facility] CXR 10/29/12 Findings: The heart is again enlarged in size. Postsurgical  changes are again seen. The lungs are well-aerated bilaterally  without focal confluent infiltrate. No sizable effusion is noted.  IMPRESSION:  No acute abnormalities seen.  Original Report Authenticated By: [name], M.D.  02/25/13- 73 yoF followed for COPD, sleep apnea/ failed CPAP, complicated by pulmonary hypertension, CAD/ Hx MI. PCP Dr Maurice Small, Card- Dr Mayford Knife     Daughter here c/o feeling fatigued, dizziness,  and increased DOE x 2 wks.  No cough, wheezing, chest tightness, or chest pain noted at this time. Note med list includes Spiriva and [place]. O2 2L/ [facility] She thinks she has been getting too much Lasix, 80 mg daily. Dr. Mayford Knife is away and her office wants to send her to ER. Frequent bathroom trips, orthostatic. Not taking either Tudorza or Spiriva.no infection. Back surgery with no respiratory problem June.  08/29/13- 73 yoF followed for COPD, sleep apnea/ failed CPAP, complicated by pulmonary hypertension, CAD/ Hx MI. PCP Dr Maurice Small, Card- Dr Mayford Knife     Daughter here Follows For: SOB worse on exertion - Diff carrying purse and managing oxygen tanks - Wears oxygen all the timewhen at home - Denies cough or wheezing Exercise here- room air walking 08/29/13- desat to  88%. Says her oxygen tank is too heavy and asks about portable concentrator.  Review of Systems- see HPI Constitutional:   + weight loss, night sweats, +Fevers, chills,No- fatigue, lassitude. HEENT:   + headaches,  No-Difficulty swallowing,  Tooth/dental problems, Sore throat,                No-sneezing, itching, ear ache, nasal congestion, CV:  No chest pain, orthopnea, PND, +swelling in lower extremities, no-anasarca, dizziness, palpitations GI  No heartburn, indigestion, abdominal pain, nausea, vomiting, ,  Resp:   No excess mucus, no productive cough,  + non-productive cough,  No coughing up of blood.  No change in color of mucus.  No wheezing.   Skin: no rash or lesions. GU: . MS:  No joint pain or swelling.   Psych:  No change in mood or affect. No depression or anxiety.  No memory loss.  Objective:   Physical Exam  General- +Alert, Oriented, Affect-appropriate, Distress- none acute   Obese.      On O2 2L sat 91% at rest   Wheelchair. Skin- rash-none, lesions- none, excoriation- none Lymphadenopathy- none Head- atraumatic            Eyes- Gross vision intact, PERRLA, conjunctivae clear secretions            Ears- Hearing, canals normal            Nose- Clear, No-septal dev,  mucus, polyps, erosion, perforation             Throat- Mallampati III , mucosa clear/ dry , drainage- none, tonsils- atrophic. Large                     tongue. Neck- flexible , trachea midline, no stridor , thyroid nl, carotid no bruit Chest - symmetrical excursion , unlabored           Heart/CV- Rapid RR , no murmur heard today , no gallop  , no rub, nl s1 s2                           - JVD- none , edema-none, stasis changes- none, varices- none.  Homan's NEG.           Lung- +distant. Clear to P&A, wheeze- none , dullness-none, rub- none           Chest wall-  Abd-  Br/ Gen/ Rectal- Not done, not indicated Extrem- cyanosis- none, clubbing, none, atrophy- none, strength- nl Neuro- grossly intact to  observation  Assessment & Plan:

## 2013-08-29 NOTE — Patient Instructions (Addendum)
Exercise on room air for saturation     Dx COPD  Order DME [facility]- evaluate for light portable O2 or portable concentrator 2L/min. Has o2 now. Desat today ambulating on room air to 88%  Dx COPD                                                   Her current O2 too heavy to carry.

## 2013-08-29 NOTE — Progress Notes (Signed)
Patient is a pleasant 73 y.o. WF referred to lipid clinic by Dr. Mayford Knifeurner.  She is here for f/u after starting simvastatin 20 mg qd three months ago.  Prior to this she wasn't on lipid lowering therapy and LDL was ~ 160 mg/dL.   She tolerated simvastatin 80 mg in the past, but then changed to atorvastatin for potency reasons.  She developed muscle and joint pains on atorvastatin.  Previously LDL was 58 mg/dL on Lipitor 80 mg qd, and ~ 80 mg/dL when on simvastatin 80 mg qd. Months later she complains of increased soreness in muscles and joints, so the association of muscle aches and statin was questioned..  She states she had a little soreness  on simvastatin 80 mg in the past as well, but not as bad as atorvastatin.  RF: CAD (CABG x 4), HTN, age - LDL goal < 70, non-HDL goal < 100. Meds: Simvastatin 20 mg qhs  Social history:  Quit smoking 2010.  No alcohol.  Retired from working in [employer] store. Diet: Much improved.  She previously ate egg/sausage every morning, and now only eating this twice weekly, and eating cereal the other mornings. Patient eats late breakfast, so often skips lunch.  For dinner she eats a meat and vegetable.  Rarely eats out.  Doesn't drink soda.  Drinks sweet tea.   Exercise:  Can't do it due to back and leg pain.  She uses oxygen at home, so this is also limiting execise.  Labs:   07/2013   LDL 84, TC 168, TG 141, HDL 55, LFTs WNL (simvastatin 20 mg qd) 04/2013  LDL 160, TC 237, TG 134, HDL 60, non-HDL 177 (not on lipid lowering meds)  Current Outpatient Prescriptions  Medication Sig Dispense Refill  . B-12, Methylcobalamin, 1000 MCG SUBL Place under the tongue.      . calcium-vitamin D (OSCAL WITH D) 250-125 MG-UNIT per tablet Take 1 tablet by mouth daily.      Marland Kitchen. Co-Enzyme Q10 200 MG CAPS Take 200 mg by mouth daily.      . digoxin (LANOXIN) 0.125 MG tablet Take 0.125 mg by mouth daily.      . fexofenadine (ALLEGRA) 180 MG tablet Take 180 mg by mouth daily.      .  furosemide (LASIX) 40 MG tablet Take 80 mg by mouth daily.       Marland Kitchen. KLOR-CON M20 20 MEQ tablet Take 1 tablet by mouth 2 (two) times daily.       [place] Lanius. Krill Oil Omega-3 300 MG CAPS Take by mouth.      . metoprolol (LOPRESSOR) 50 MG tablet Take 50 mg by mouth 2 (two) times daily.        Marland Kitchen. omeprazole (PRILOSEC OTC) 20 MG tablet Take 20 mg by mouth daily.      . Rivaroxaban (XARELTO) 20 MG TABS tablet Take 1 tablet (20 mg total) by mouth daily.  30 tablet    . sertraline (ZOLOFT) 100 MG tablet Take 100 mg by mouth daily.        . simvastatin (ZOCOR) 20 MG tablet Take 1 tablet (20 mg total) by mouth every evening.  30 tablet  5   No current facility-administered medications for this visit.   Allergies  Allergen Reactions  . Prednisone     jittery  . Xopenex [Levalbuterol]     Made mouth and throat sore  . Codeine   . Lipitor [Atorvastatin]     Muscle aches  . Ventolin [  GUY:[id number]]     "jittery"   Family History  Problem Relation Age of Onset  . Anemia Mother   . Emphysema Father   . Esophageal cancer Daughter

## 2013-08-29 NOTE — Assessment & Plan Note (Addendum)
Excellent improvement in LDL since restarting statin.  Tolerating simvastatin 20 mg qd well, and got nearly a 50% LDL reduction over past 3 months.  She has improved her diet, and eating less fatty foods.  She has lost 4 lbs over the past month also.  We will see what diet and weight loss for another 4 months can do for her cholesterol, and if LDL elevated in 4 months, may consider increasing simvastatin to 40 mg qhs.  She did tolerate simvastatin 40 mg in the past, but she would like to work on diet and weight loss first, which I am agreeable with. Plan: 1.  Continue simvastatin 20 mg at night.   2.  Continue to eat healthier breakfast options and limit eggs/sausage to only twice weekly. 3.  Keep up the weight loss efforts.   4.  In 4 months will recheck cholesterol (12/23/13 - fasting labs), and see Riki Rusk three days later (12/26/13) at 11:30 am.  If LDL still > 80 mg/dL, may increase simvastatin to 40 mg at that time.

## 2013-08-29 NOTE — Patient Instructions (Signed)
1.  Continue simvastatin 20 mg at night.   2.  Continue to eat healthier breakfast options and limit eggs/sausage to only twice weekly. 3.  Keep up the weight loss efforts.   4.  In 4 months will recheck cholesterol (12/23/13 - fasting labs), and see Riki Rusk three days later (12/26/13) at 11:30 am.  If LDL still > 80 mg/dL, may increase simvastatin to 40 mg at that time.

## 2013-09-04 ENCOUNTER — Telehealth: Payer: Self-pay | Admitting: Internal Medicine

## 2013-09-04 NOTE — Telephone Encounter (Signed)
Called and spoke with pt. She needs her O2 order sent to [facility] not [facility] were we sent the order too. Please advise PCC's. thanks

## 2013-09-05 NOTE — Telephone Encounter (Signed)
Reprinted order and faxed to [facility]

## 2013-09-09 ENCOUNTER — Telehealth: Payer: Self-pay | Admitting: Internal Medicine

## 2013-09-09 NOTE — Telephone Encounter (Signed)
Error

## 2013-09-11 ENCOUNTER — Telehealth: Payer: Self-pay | Admitting: Internal Medicine

## 2013-09-11 NOTE — Telephone Encounter (Signed)
Ok to let [name] know of response from [facility]. She will need to use what her insurance/ DME will cover, unless she wants to self pay for [facility]'s portable concentrator.

## 2013-09-11 NOTE — Telephone Encounter (Signed)
[facility] faxed  To Att;Dr Maple Hudson, From [facility]  Good Morning, Thank you for the referral on [name].  [facility] is not able to service this patient oxygen needs.  [name] has an HMO plan with no out of network benefits.  [facility] is not contracted with this plan.  I will be happy to talk with [name], if she would like to purchase an Careers information officer.  Thank Bonita Quin Jannett Celestine  /[facility] [phone]

## 2013-09-13 NOTE — Telephone Encounter (Signed)
Spoke with [name] about  Response from [facility], and her options for her portable concentrator, pt was given [employer] phone # to discuss. [name]

## 2013-09-22 NOTE — Assessment & Plan Note (Signed)
Continues oxygen 2 L

## 2013-09-22 NOTE — Assessment & Plan Note (Signed)
Chronic hypoxic respiratory failure. Qualifies for portable oxygen Plan-help change to light portable system or portable O2 concentrator

## 2013-09-24 ENCOUNTER — Other Ambulatory Visit: Payer: Self-pay | Admitting: Orthopaedic Surgery

## 2013-09-24 ENCOUNTER — Telehealth: Payer: Self-pay | Admitting: Internal Medicine

## 2013-09-24 DIAGNOSIS — M79609 Pain in unspecified limb: Secondary | ICD-10-CM

## 2013-09-24 NOTE — Telephone Encounter (Signed)
Spoke with [name] at [facility] wants to obtain POC from them  He needs something sent stating she needs this, it can be same order we sent to [facility]  I reprinted last o2 order and faxed to him at [phone]

## 2013-10-02 ENCOUNTER — Other Ambulatory Visit: Payer: Self-pay | Admitting: Cardiology

## 2013-10-02 ENCOUNTER — Ambulatory Visit
Admission: RE | Admit: 2013-10-02 | Discharge: 2013-10-02 | Disposition: A | Discharge status: Home / Self Care | Payer: Medicare Other | Source: Ambulatory Visit | Attending: Orthopaedic Surgery | Admitting: Orthopaedic Surgery

## 2013-10-02 DIAGNOSIS — M79609 Pain in unspecified limb: Secondary | ICD-10-CM

## 2013-10-10 ENCOUNTER — Encounter: Payer: Self-pay | Admitting: Internal Medicine

## 2013-10-10 ENCOUNTER — Ambulatory Visit (INDEPENDENT_AMBULATORY_CARE_PROVIDER_SITE_OTHER): Payer: Medicare Other | Admitting: Internal Medicine

## 2013-10-10 VITALS — BP 122/70 | HR 66 | Ht 66.0 in | Wt 201.4 lb

## 2013-10-10 DIAGNOSIS — J302 Other seasonal allergic rhinitis: Secondary | ICD-10-CM

## 2013-10-10 DIAGNOSIS — J438 Other emphysema: Secondary | ICD-10-CM

## 2013-10-10 DIAGNOSIS — G4733 Obstructive sleep apnea (adult) (pediatric): Secondary | ICD-10-CM

## 2013-10-10 DIAGNOSIS — J309 Allergic rhinitis, unspecified: Secondary | ICD-10-CM

## 2013-10-10 DIAGNOSIS — J439 Emphysema, unspecified: Secondary | ICD-10-CM

## 2013-10-10 NOTE — Patient Instructions (Signed)
We will try to find a lighter portable O2 system  Please call as needed

## 2013-10-10 NOTE — Progress Notes (Signed)
Patient ID: [name], female    DOB: 05-12-1941, 73 y.o.   MRN: [id number]  HPI 11/26/10-SATURATION QUALIFICATIONS: Patient Saturations on Room Air while Ambulating = 87% Patient Saturations on 2 Liters of oxygen while Ambulating = 96%[name],CMA   11/26/10 HPI: 78 yoF followed for COPD, sleep apnea, complicated by pulmonary hypertension. Last here July 29, 2010. PFT reviewed then- severe COPD FEV1 0.88/ 38%. Pollen season causing watery nose.  She took advice from a tv ad and changed her O2 DME to a [employer], but now wants to change back to [facility]. She continues O2 mostly at night. Wearing heart monitor because of tachypalpitation/ Dr [name].. Back pain/ DGD limits her acitivity as much as her dyspnea.  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   04/01/11-  70 yoF followed for COPD, sleep apnea, complicated by pulmonary hypertension. Heart monitor results ok- Dr [name]- for tachypalpitation. Back pain was slowing her down. She got first epidural steroid injection this week.  Dr [name] was following for THYROID nodule with hx partial thyroidectomy.  Flu vax talk.  She stays in when hot outdoors, rather than bothering with her portable oxygen. Continues O2 2L [facility] for sleep. Feels she is breathing some better lately, not worse. Says CPAP broke her face out- failed to tolerate.   11/30/11-  70 yoF followed for COPD, sleep apnea, complicated by pulmonary hypertension. PCP Dr [name], Card- Dr [name] hospital visit. Hospitalized April 10-14 according to family although discharge summary indicates April 10 through April 18. 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 History of intolerance to CPAP. CODE STATUS during this hospitalization was DO NOT RESUSCITATE. She and her daughter indicate that  she is feeling much better. Now off prednisone, oxygen added.  She describes a new dry cough and is noted to be on an ACE inhibitor which we discussed as one possible cause for the cough. Home oxygen continuous and portable 2 L/[facility]. CXR- 11/04/11- images reviewed IMPRESSION:  Volume loss at the left lung base. Streaky opacity is favored to  reflect atelectasis but infection is difficult to exclude. No  pulmonary edema or pleural effusions.  Original Report Authenticated By: [name], M.D.  ECHO- EF 65-70%. Grade 2 diastolic dysfunction. Mild aortic valve regurgitation. Mild to moderate mitral stenosis with mild mitral regurgitation. Left atrium mildly dilated.  04/03/12-  70 yoF followed for COPD, sleep apnea/ failed CPAP, complicated by pulmonary hypertension. PCP Dr [name], Card- Dr [name] Breathing at baseline, using O2 2L/ [facility] for sleep. She notices that her upper eyelids get red without irritation of the globes. She has been having fall season related nasal congestion. Using no regular medication, just her oxygen. She failed to tolerate CPAP and just wears oxygen.  08/27/12- 71 yoF followed for COPD, sleep apnea/ failed CPAP, complicated by pulmonary hypertension. PCP Dr [name], Card- Dr [name] FOR: still SOB at times; coughs when feels nose is running Blames cough on post nasal drip noted to "allergy". Benadryl over dries and makes head itch. Wearing oxygen 2 L/[facility] for sleep. Little cough. No chest pain, swelling, palpitation or blood.  10/26/12-  71 yoF followed for COPD, sleep apnea/ failed CPAP, complicated by pulmonary hypertension. PCP Dr [name], Card- Dr [name]   Family here. Follow up to discuss back surgery.  Reports breathing has worsened over  the past week.  Has SOB with very little activity, chest tightness with SOB, runny nose with clear drainage, and nonprod cough.   Gradually increasing dyspnea on exertion, limited as much by  weakness. Some cough, no wheeze, nose drips. No chest pain. Allegra helps rhinitis. Continuous O2 2L/min/ [facility].  Feet swell, left more than right-chronic, dependent. No calf pain and no acute change. No new heart problems/Dr. Turner/[facility].  12/04/12- 72 yoF followed for COPD, sleep apnea/ failed CPAP, complicated by pulmonary hypertension. PCP Dr [name], Card- Dr [name]     Daughter here ACUTE VISIT: runny nose, sore throat, cough-gagging-non productive, chills. Son has recently been sick.  Increased SOB as well. 1 week frontal headache, watery rhinorhea, scratchy throat, dry cough. Began w/ fever/ chill. O2 2L/ [facility] CXR 10/29/12 Findings: The heart is again enlarged in size. Postsurgical  changes are again seen. The lungs are well-aerated bilaterally  without focal confluent infiltrate. No sizable effusion is noted.  IMPRESSION:  No acute abnormalities seen.  Original Report Authenticated By: [name], M.D.  02/25/13- 72 yoF followed for COPD, sleep apnea/ failed CPAP, complicated by pulmonary hypertension, CAD/ Hx MI. PCP Dr [name], Card- Dr [name]     Daughter here c/o feeling fatigued, dizziness,  and increased DOE x 2 wks.  No cough, wheezing, chest tightness, or chest pain noted at this time. Note med list includes Spiriva and [place]. O2 2L/ [facility] She thinks she has been getting too much Lasix, 80 mg daily. Dr. Mayford Lloyd is away and her office wants to send her to ER. Frequent bathroom trips, orthostatic. Not taking either Tudorza or Spiriva.no infection. Back surgery with no respiratory problem June.  08/29/13- 72 yoF followed for COPD, sleep apnea/ failed CPAP, complicated by pulmonary hypertension, CAD/ Hx MI. PCP Dr [name], Card- Dr [name]     Daughter here Follows For: SOB worse on exertion - Diff carrying purse and managing oxygen tanks - Wears oxygen all the timewhen at home - Denies cough or wheezing Exercise here- room air walking 08/29/13- desat to  88%. Says her oxygen tank is too heavy and asks about portable concentrator.  10/10/13-73 yoF followed for COPD, sleep apnea/ failed CPAP, complicated by pulmonary hypertension, CAD/ Hx MI. PCP Dr [name], Card- Dr [name]     FOLLOWS FOR: SOB when walking; hard to carry the O2 tanks-insurance denied for her to have portable tanks.  O2 2L/ [facility] sleep and portable Daughter had esophageal cancer and just died from brain metastasis. Husband is BKA in wheelchair. She is having trouble coping. Patient is finding her current portable oxygen too heavy Reports little cough or wheeze.   Review of Systems- see HPI Constitutional:   + weight loss, night sweats, no-Fevers, chills,No- fatigue, lassitude. HEENT:   + headaches,  No-Difficulty swallowing,  Tooth/dental problems, Sore throat,                No-sneezing, itching, ear ache, nasal congestion, CV:  No chest pain, orthopnea, PND, +swelling in lower extremities, no-anasarca, dizziness, palpitations GI  No heartburn, indigestion, abdominal pain, nausea, vomiting, ,  Resp:   No excess mucus, no productive cough,  + non-productive cough,  No coughing up of blood.                No change in color of mucus.  No wheezing.   Skin: no rash or lesions. GU: . MS:  No joint pain or swelling.   Psych:  No change in  mood or affect. No depression +anxiety.  No memory loss.  Objective:   Physical Exam  General- +Alert, Oriented, Affect-appropriate, Distress- none acute   Obese.      On O2 2L sat 91% at rest   Wheelchair. Skin- rash-none, lesions- none, excoriation- none Lymphadenopathy- none Head- atraumatic            Eyes- Gross vision intact, PERRLA, conjunctivae clear secretions            Ears- Hearing, canals normal            Nose- Clear, No-septal dev, mucus, polyps, erosion, perforation             Throat- Mallampati III , mucosa clear/ dry , drainage- none, tonsils- atrophic. Large                                              tongue. Neck- flexible , trachea midline, no stridor , thyroid nl, carotid no bruit Chest - symmetrical excursion , unlabored           Heart/CV- Rapid RR , no murmur heard today , no gallop  , no rub, nl s1 s2                           - JVD- none , edema-none, stasis changes- none, varices- none.  Homan's NEG.           Lung- +distant. Clear to P&A, wheeze- none , dullness-none, rub- none           Chest wall-  Abd-  Br/ Gen/ Rectal- Not done, not indicated Extrem- cyanosis- none, clubbing, none, atrophy- none, strength- nl Neuro- grossly intact to observation  Assessment & Plan:

## 2013-10-22 ENCOUNTER — Telehealth: Payer: Self-pay | Admitting: Internal Medicine

## 2013-10-22 ENCOUNTER — Other Ambulatory Visit: Payer: Self-pay | Admitting: Cardiology

## 2013-10-22 NOTE — Telephone Encounter (Signed)
Should this be qd or bid? Please advise. Thanks, MI 

## 2013-10-22 NOTE — Telephone Encounter (Signed)
[facility] found lightweight POC through [facility] at 6.5# and CY suggests patient go to their office to check it out to make sure patient is okay with size and weight prior to sending order. Pt will contact me back once she has done this.   Pt was given physical address and phone number to [facility].

## 2013-10-23 ENCOUNTER — Telehealth: Payer: Self-pay | Admitting: Cardiology

## 2013-10-23 NOTE — Telephone Encounter (Signed)
lmtrc

## 2013-10-23 NOTE — Telephone Encounter (Signed)
Spoke with patient, her husband told her the cardiology office called to update her medications. Unable to determine who may have called her.  She thinks he may have not heard correctly, and will call her pulmonology office.

## 2013-10-23 NOTE — Telephone Encounter (Signed)
New message    Returning a nurses call to update her medication

## 2013-10-24 ENCOUNTER — Telehealth: Payer: Self-pay | Admitting: *Deleted

## 2013-10-24 NOTE — Telephone Encounter (Signed)
Per Geryl Councilman patient does not need PA per [employer]

## 2013-10-28 ENCOUNTER — Other Ambulatory Visit: Payer: Self-pay | Admitting: *Deleted

## 2013-10-28 MED ORDER — POTASSIUM CHLORIDE CRYS ER 20 MEQ PO TBCR: 20.0000 meq | EXTENDED_RELEASE_TABLET | Freq: Two times a day (BID) | ORAL | Status: DC

## 2013-10-30 NOTE — Telephone Encounter (Signed)
What medication is this for?

## 2013-10-30 NOTE — Assessment & Plan Note (Signed)
Not bothersome yet

## 2013-10-30 NOTE — Assessment & Plan Note (Signed)
Failed CPAP. Continues O2 2 L sleep/[facility]

## 2013-10-30 NOTE — Assessment & Plan Note (Signed)
Plan-we will contact DME/[facility] to help assess light portable oxygen options for her

## 2013-11-05 NOTE — Telephone Encounter (Signed)
It was for the potassium, but it has already been sent in.

## 2013-12-02 ENCOUNTER — Other Ambulatory Visit: Payer: Self-pay | Admitting: *Deleted

## 2013-12-02 MED ORDER — SIMVASTATIN 20 MG PO TABS: 20.0000 mg | ORAL_TABLET | Freq: Every evening | ORAL | Status: DC

## 2013-12-23 ENCOUNTER — Other Ambulatory Visit: Payer: Self-pay | Admitting: *Deleted

## 2013-12-23 ENCOUNTER — Ambulatory Visit (INDEPENDENT_AMBULATORY_CARE_PROVIDER_SITE_OTHER): Payer: Medicare Other | Admitting: Cardiology

## 2013-12-23 ENCOUNTER — Other Ambulatory Visit: Payer: Medicare Other

## 2013-12-23 ENCOUNTER — Encounter: Payer: Self-pay | Admitting: Cardiology

## 2013-12-23 VITALS — BP 139/64 | HR 79 | Ht 66.0 in | Wt 192.0 lb

## 2013-12-23 DIAGNOSIS — I10 Essential (primary) hypertension: Secondary | ICD-10-CM

## 2013-12-23 DIAGNOSIS — I4892 Unspecified atrial flutter: Secondary | ICD-10-CM

## 2013-12-23 DIAGNOSIS — E785 Hyperlipidemia, unspecified: Secondary | ICD-10-CM

## 2013-12-23 DIAGNOSIS — Z79899 Other long term (current) drug therapy: Secondary | ICD-10-CM

## 2013-12-23 DIAGNOSIS — Z7901 Long term (current) use of anticoagulants: Secondary | ICD-10-CM

## 2013-12-23 DIAGNOSIS — I2510 Atherosclerotic heart disease of native coronary artery without angina pectoris: Secondary | ICD-10-CM

## 2013-12-23 DIAGNOSIS — I5032 Chronic diastolic (congestive) heart failure: Secondary | ICD-10-CM

## 2013-12-23 LAB — BASIC METABOLIC PANEL
BUN: 12 mg/dL (ref 6–23)
CO2: 37 mEq/L — ABNORMAL HIGH (ref 19–32)
CREATININE: 0.8 mg/dL (ref 0.4–1.2)
Calcium: 9.3 mg/dL (ref 8.4–10.5)
Chloride: 97 mEq/L (ref 96–112)
GFR: 73.42 mL/min (ref >60.00)
Glucose, Bld: 118 mg/dL — ABNORMAL HIGH (ref 70–99)
POTASSIUM: 3.6 meq/L (ref 3.5–5.1)
Sodium: 142 mEq/L (ref 135–145)

## 2013-12-23 MED ORDER — NITROGLYCERIN 0.4 MG SL SUBL: 0.4000 mg | SUBLINGUAL_TABLET | SUBLINGUAL | Status: DC | PRN

## 2013-12-23 NOTE — Patient Instructions (Addendum)
Your physician has recommended you make the following change in your medication: START TAKING PRILOSEC DAILY INSTEAD OF AS NEEDED.  Your physician recommends that you return for lab work TODAY (bmet, lipids)  Your physician recommends that you schedule a follow-up appointment in: 3 WEEKS WITH NP OR PA TO EVALUATE WHETHER TAKING PRILOSEC REGULARLY IS EFFECTIVE.  Your physician wants you to follow-up in: 6 MONTHS WITH DR. Mayford Knife.   You will receive a reminder letter in the mail two months in advance. If you don't receive a letter, please call our office to schedule the follow-up appointment.

## 2013-12-23 NOTE — Progress Notes (Signed)
[address] [place], [place]  [address] Phone: [phone] Fax:  [phone]  Date:  12/23/2013   ID:  Monica GoresJoyce R Wogan, DOB September 13, 1940, MRN [id number]  PCP:  Astrid DivineGRIFFIN,[name], MD  Cardiologist:  Armanda Magicraci Jodell Weitman, MD   History of Present Illness: [name] is a 73 y.o. female with a history of chronic diastolic CHF, moderate pulmonary HTN, HTN, CAD and dyslipidemia who presents today for followup. She is doing well from a cardiac standpoint.  She denies any SOB, DOE, LE edema, dizziness, palpitations or syncope.  She occasionally has some mild chest pressure that occurs at night when she gets ready to go to bed.  She says that she cannot tell if it is her heart or GERD. She is on Prilosec but does not take it every day.   Wt Readings from Last 3 Encounters:  12/23/13 192 lb (87.091 kg)  10/10/13 201 lb 6.4 oz (91.354 kg)  08/29/13 207 lb 6.4 oz (94.076 kg)     Past Medical History  Diagnosis Date  . COPD (chronic obstructive pulmonary disease)   . Pulmonary HTN     PASP 50-5355mmHg by ONGE9/5284echo8/2014  . H/O: rheumatic fever   . History of rheumatic heart disease     X 3-LAST TIME WHEN PATIENT WAS 73 YRS OLD  . Sleep apnea     intolerant to CPAP  . Hypertension   . H/O right bundle branch block   . Bursitis, shoulder     bilateral  . GERD (gastroesophageal reflux disease)   . H/O hiatal hernia   . Anxiety   . Depression   . Varicose veins   . PUD (peptic ulcer disease)   . 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 DJD (degenerative joint disease)   . OA (osteoarthritis)   . Hyperlipidemia   . Urinary incontinence   . Pulmonary HTN   . Thyroid nodule   . CHF (congestive heart failure)     chronic diasotlic CHF    Current Outpatient Prescriptions  Medication Sig Dispense Refill  . B-12, Methylcobalamin, 1000 MCG SUBL Place under the tongue.      . calcium-vitamin D (OSCAL WITH D) 250-125 MG-UNIT per tablet Take 1 tablet by mouth daily.      Marland Kitchen. Co-Enzyme  Q10 200 MG CAPS Take 200 mg by mouth daily.      . digoxin (LANOXIN) 0.125 MG tablet Take 0.125 mg by mouth daily.      . fexofenadine (ALLEGRA) 180 MG tablet Take 180 mg by mouth daily.      . furosemide (LASIX) 80 MG tablet take 1 tablet by mouth once daily then 1 EXTRA ONLY IF NEEDED as directed  45 tablet  6  . gabapentin (NEURONTIN) 100 MG capsule Take 1 capsule by mouth 4 (four) times daily. 2 in am 2 in pm      . Providence LaniusKrill Oil Omega-3 300 MG CAPS Take by mouth.      . metoprolol (LOPRESSOR) 50 MG tablet Take 50 mg by mouth 2 (two) times daily.        Marland Kitchen. omeprazole (PRILOSEC OTC) 20 MG tablet Take 20 mg by mouth daily.      . potassium chloride SA (KLOR-CON M20) 20 MEQ tablet Take 1 tablet (20 mEq total) by mouth 2 (two) times daily.  60 tablet  3  . Rivaroxaban (XARELTO) 20 MG TABS tablet Take 1 tablet (20 mg total) by mouth daily.  30 tablet    . sertraline (ZOLOFT) 100 MG tablet Take 100 mg by mouth daily.        . simvastatin (ZOCOR) 20 MG tablet Take 1 tablet (20 mg total) by mouth every evening.  30 tablet  1   No current facility-administered medications for this visit.    Allergies:    Allergies  Allergen Reactions  . Prednisone     jittery  . Xopenex [Levalbuterol]     Made mouth and throat sore  . Codeine   . Lipitor [Atorvastatin]     Muscle aches  . Ventolin [Kdc:Albuterol]     "jittery"    Social History:  The patient  reports that she quit smoking about 6 years ago. Her smoking use included Cigarettes. She has a 47 pack-year smoking history. She has never used smokeless tobacco. She reports that she does not drink alcohol or use illicit drugs.   Family History:  The patient's family history includes Anemia in her mother; Emphysema in her father; Esophageal cancer in her daughter.   ROS:  Please see the history of present illness.      All other systems reviewed and negative.   PHYSICAL EXAM: VS:  BP 139/64  Pulse 79  Ht 5\' 6"  (1.676 m)  Wt 192 lb (87.091 kg)   BMI 31.00 kg/m2 Well nourished, well developed, in no acute distress HEENT: normal Neck: no JVD Cardiac:  normal S1, S2; RRR; no murmur Lungs:  clear to auscultation bilaterally, no wheezing, rhonchi or rales Abd: soft, nontender, no hepatomegaly Ext: no edema Skin: warm and dry Neuro:  CNs 2-12 intact, no focal abnormalities noted  ASSESSMENT AND PLAN:  1.  Dyslipidemia - she is getting her lipids checked today 2. Chronic diastolic CHF - well compensated - continue Lasix/potassium  - check BMET 3. HTN - well controlled - continue Metoprolol        4.  CAD with chronic CP that she thinks is due to indigestion and gas.  She had a normal nuclear stress test in October.  I recommend that she start taking her Prilosec daily to see if symptoms improve since they mainly occur at night. - start taking Prilosec daily       5. Paroxysmal atrial flutter  - Her CHADS VASC score is 4 (age > 21, female, HTN, CHF) continue Xarelto  Followup with PA in 3 weeks to see if symptoms have improved .  Followup with me in 6 months       Signed, Armanda Magic, MD 12/23/2013 9:17 AM

## 2013-12-25 ENCOUNTER — Other Ambulatory Visit: Payer: Self-pay | Admitting: Orthopaedic Surgery

## 2013-12-25 ENCOUNTER — Telehealth: Payer: Self-pay | Admitting: Internal Medicine

## 2013-12-25 DIAGNOSIS — J439 Emphysema, unspecified: Secondary | ICD-10-CM

## 2013-12-25 DIAGNOSIS — M545 Low back pain, unspecified: Secondary | ICD-10-CM

## 2013-12-25 NOTE — Telephone Encounter (Signed)
Spoke with the pt  She states that the POC from [facility] weighs 10 lbs with the batteries in it and she is not able to carry this \ She called her insurance company and asked about POC that weighs less than 5 lbs  She states that insurance advised to call them for "prior authorization" for this  Pt also mentioned getting this from [facility]  She states "I want the lightest and cheapest one" Please advise, thanks!

## 2013-12-25 NOTE — Telephone Encounter (Signed)
Per CY-okay to placed order to DME for light portable oxygen concentrator 2L/M dx COPD " patient not strong enough to carry 10# concentrator". Thanks.

## 2013-12-25 NOTE — Telephone Encounter (Signed)
Called spoke with patient and informed her that CY okayed for the order to DME for light weight portable O2 concentrator.  Pt asked about the prior authorization that she mentioned earlier.  Pt requests our office call her insurance determination dept @ [phone].  Called the above number and was informed that the DME company is whom should call regarding coverage.  Order placed.  Called spoke with patient, discussed the above with her and also gave her a few names of portable o2 concentrators that she could look into >> [facility] (which she wasn't sure accepted her insurance), [employer] and [employer].  Pt will try these and call our office if anything further is needed.

## 2013-12-26 ENCOUNTER — Ambulatory Visit: Payer: Medicare Other | Admitting: Pharmacist

## 2013-12-29 ENCOUNTER — Ambulatory Visit
Admission: RE | Admit: 2013-12-29 | Discharge: 2013-12-29 | Disposition: A | Discharge status: Home / Self Care | Payer: Medicare Other | Source: Ambulatory Visit | Attending: Orthopaedic Surgery | Admitting: Orthopaedic Surgery

## 2013-12-29 DIAGNOSIS — M545 Low back pain, unspecified: Secondary | ICD-10-CM

## 2013-12-30 NOTE — Telephone Encounter (Signed)
Re; Jacklynn Lewis  From [employer]: Jannett Celestine,  Good morning thank you for the referrral on [name] [facility]  Is not able to service this patient oxygen needs.  [name] has an HMO plan with no out of network benefits.  [facility] is not contracted with this plan. I received this same referral in February 2015.  I understand she can not carry 10 pounds, however unless she purchases a unit or changes her insurance, she will not be able to get an [employer] concentrator.  I will be happy to talk with [name], if she would like to purchase an Careers information officer.  Please let me know if you feel this patient can afford to purchase a unit.  Thank You. Rebecca Minton/[employer] PH#[phone] Fax [phone]

## 2013-12-31 ENCOUNTER — Telehealth: Payer: Self-pay | Admitting: Internal Medicine

## 2013-12-31 DIAGNOSIS — I2789 Other specified pulmonary heart diseases: Secondary | ICD-10-CM

## 2013-12-31 NOTE — Telephone Encounter (Signed)
Spoke with the pt  She states that she would like order sent to [facility] for o2  She states that her insurance told her that they could help her with a light weight POC  She states that she has confirmed with [facility] that they have a tank less than 5 lbs  Order sent to [facility]

## 2014-01-07 ENCOUNTER — Telehealth: Payer: Self-pay | Admitting: Cardiology

## 2014-01-07 NOTE — Telephone Encounter (Signed)
To dr Mayford Knife to make aware

## 2014-01-07 NOTE — Telephone Encounter (Signed)
New Message  Pt called to cancel her appt. States that she has a schedule conflict however she wanted the staff to be informed that her Prilosec is working. Please call back to discuss if needed// SR

## 2014-01-09 ENCOUNTER — Ambulatory Visit: Payer: Medicare Other | Admitting: Cardiology

## 2014-01-13 ENCOUNTER — Other Ambulatory Visit: Payer: Self-pay | Admitting: Cardiology

## 2014-01-28 ENCOUNTER — Telehealth: Payer: Self-pay | Admitting: Internal Medicine

## 2014-01-28 NOTE — Telephone Encounter (Signed)
Called [facility], spoke with Bridgett who is with the answering service.  Was advised to call office back tomorrow.

## 2014-01-29 ENCOUNTER — Telehealth: Payer: Self-pay | Admitting: Internal Medicine

## 2014-01-29 ENCOUNTER — Ambulatory Visit (INDEPENDENT_AMBULATORY_CARE_PROVIDER_SITE_OTHER): Payer: Medicare Other

## 2014-01-29 DIAGNOSIS — I2789 Other specified pulmonary heart diseases: Secondary | ICD-10-CM

## 2014-01-29 NOTE — Telephone Encounter (Signed)
Called spoke with [name]. Confirmed pt does need new qualifying sats in order for pt to get a portable O2. I advised her pt already called in this morning letting us know this and we have message over to CDY. Will sign off message

## 2014-01-29 NOTE — Telephone Encounter (Signed)
w

## 2014-01-29 NOTE — Telephone Encounter (Signed)
Called spoke with [name]. She reports she found a smaller portable O2 tank through [facility]. She was told that her insurance is wanting her to be re-evaluated w/ updated qualifying O2 sats as well. [name] is asking to be worked in Dr. Roxy Cedar schedule. Please advise thanks

## 2014-01-29 NOTE — Telephone Encounter (Signed)
I spoke with patient-she will be here today at 2:30pm to have her O2 sats re-checked for DME company.

## 2014-01-31 ENCOUNTER — Other Ambulatory Visit: Payer: Self-pay | Admitting: *Deleted

## 2014-01-31 MED ORDER — SIMVASTATIN 20 MG PO TABS: 20.0000 mg | ORAL_TABLET | Freq: Every evening | ORAL | Status: DC

## 2014-02-18 ENCOUNTER — Other Ambulatory Visit: Payer: Self-pay | Admitting: Cardiology

## 2014-03-03 ENCOUNTER — Other Ambulatory Visit: Payer: Self-pay

## 2014-03-03 MED ORDER — POTASSIUM CHLORIDE CRYS ER 20 MEQ PO TBCR: 20.0000 meq | EXTENDED_RELEASE_TABLET | Freq: Two times a day (BID) | ORAL | Status: DC

## 2014-03-24 ENCOUNTER — Other Ambulatory Visit: Payer: Self-pay | Admitting: Cardiology

## 2014-04-15 ENCOUNTER — Encounter (INDEPENDENT_AMBULATORY_CARE_PROVIDER_SITE_OTHER): Payer: Self-pay

## 2014-04-15 ENCOUNTER — Ambulatory Visit (INDEPENDENT_AMBULATORY_CARE_PROVIDER_SITE_OTHER): Payer: Medicare Other | Admitting: Internal Medicine

## 2014-04-15 ENCOUNTER — Encounter: Payer: Self-pay | Admitting: Internal Medicine

## 2014-04-15 VITALS — BP 142/68 | HR 78 | Ht 66.0 in | Wt 188.0 lb

## 2014-04-15 DIAGNOSIS — J309 Allergic rhinitis, unspecified: Secondary | ICD-10-CM

## 2014-04-15 DIAGNOSIS — J449 Chronic obstructive pulmonary disease, unspecified: Secondary | ICD-10-CM

## 2014-04-15 MED ORDER — METHYLPREDNISOLONE ACETATE 80 MG/ML IJ SUSP
80.0000 mg | Freq: Once | INTRAMUSCULAR | Status: AC
  Administered 2014-04-15: 80 mg via INTRAMUSCULAR

## 2014-04-15 MED ORDER — PHENYLEPHRINE HCL 1 % NA SOLN
3.0000 [drp] | Freq: Once | NASAL | Status: AC
  Administered 2014-04-15: 3 [drp] via NASAL

## 2014-04-15 NOTE — Patient Instructions (Signed)
Flu vax  Depo 80   Neb neo nasal  Ok to get Prevnar pneumonia vaccine later this winter, at least a month after the flu shot  Ok to continue O2 2L/ [facility] for sleep and exertion

## 2014-04-15 NOTE — Progress Notes (Signed)
Patient ID: [name], female    DOB: 05-12-1941, 73 y.o.   MRN: [id number]  HPI 11/26/10-SATURATION QUALIFICATIONS: Patient Saturations on Room Air while Ambulating = 87% Patient Saturations on 2 Liters of oxygen while Ambulating = 96%[name],CMA   11/26/10 HPI: 78 yoF followed for COPD, sleep apnea, complicated by pulmonary hypertension. Last here July 29, 2010. PFT reviewed then- severe COPD FEV1 0.88/ 38%. Pollen season causing watery nose.  She took advice from a tv ad and changed her O2 DME to a [employer], but now wants to change back to [facility]. She continues O2 mostly at night. Wearing heart monitor because of tachypalpitation/ Dr Mayford Knife.. Back pain/ DGD limits her acitivity as much as her dyspnea.  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   04/01/11-  70 yoF followed for COPD, sleep apnea, complicated by pulmonary hypertension. Heart monitor results ok- Dr Mayford Knife- for tachypalpitation. Back pain was slowing her down. She got first epidural steroid injection this week.  Dr Michaell Cowing was following for THYROID nodule with hx partial thyroidectomy.  Flu vax talk.  She stays in when hot outdoors, rather than bothering with her portable oxygen. Continues O2 2L [facility] for sleep. Feels she is breathing some better lately, not worse. Says CPAP broke her face out- failed to tolerate.   11/30/11-  70 yoF followed for COPD, sleep apnea, complicated by pulmonary hypertension. PCP Dr Maurice Small, Card- Dr Renelda Mom hospital visit. Hospitalized April 10-14 according to family although discharge summary indicates April 10 through April 18. 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 History of intolerance to CPAP. CODE STATUS during this hospitalization was DO NOT RESUSCITATE. She and her daughter indicate that  she is feeling much better. Now off prednisone, oxygen added.  She describes a new dry cough and is noted to be on an ACE inhibitor which we discussed as one possible cause for the cough. Home oxygen continuous and portable 2 L/[facility]. CXR- 11/04/11- images reviewed IMPRESSION:  Volume loss at the left lung base. Streaky opacity is favored to  reflect atelectasis but infection is difficult to exclude. No  pulmonary edema or pleural effusions.  Original Report Authenticated By: [name], M.D.  ECHO- EF 65-70%. Grade 2 diastolic dysfunction. Mild aortic valve regurgitation. Mild to moderate mitral stenosis with mild mitral regurgitation. Left atrium mildly dilated.  04/03/12-  70 yoF followed for COPD, sleep apnea/ failed CPAP, complicated by pulmonary hypertension. PCP Dr Maurice Small, Card- Dr Mayford Knife Breathing at baseline, using O2 2L/ [facility] for sleep. She notices that her upper eyelids get red without irritation of the globes. She has been having fall season related nasal congestion. Using no regular medication, just her oxygen. She failed to tolerate CPAP and just wears oxygen.  08/27/12- 71 yoF followed for COPD, sleep apnea/ failed CPAP, complicated by pulmonary hypertension. PCP Dr Maurice Small, Card- Dr Donneta Romberg FOR: still SOB at times; coughs when feels nose is running Blames cough on post nasal drip noted to "allergy". Benadryl over dries and makes head itch. Wearing oxygen 2 L/[facility] for sleep. Little cough. No chest pain, swelling, palpitation or blood.  10/26/12-  71 yoF followed for COPD, sleep apnea/ failed CPAP, complicated by pulmonary hypertension. PCP Dr Maurice Small, Card- Dr Mayford Knife   Family here. Follow up to discuss back surgery.  Reports breathing has worsened over  the past week.  Has SOB with very little activity, chest tightness with SOB, runny nose with clear drainage, and nonprod cough.   Gradually increasing dyspnea on exertion, limited as much by  weakness. Some cough, no wheeze, nose drips. No chest pain. Allegra helps rhinitis. Continuous O2 2L/min/ [facility].  Feet swell, left more than right-chronic, dependent. No calf pain and no acute change. No new heart problems/Dr. Turner/[facility].  12/04/12- 72 yoF followed for COPD, sleep apnea/ failed CPAP, complicated by pulmonary hypertension. PCP Dr Maurice Small, Card- Dr Mayford Knife     Daughter here ACUTE VISIT: runny nose, sore throat, cough-gagging-non productive, chills. Son has recently been sick.  Increased SOB as well. 1 week frontal headache, watery rhinorhea, scratchy throat, dry cough. Began w/ fever/ chill. O2 2L/ [facility] CXR 10/29/12 Findings: The heart is again enlarged in size. Postsurgical  changes are again seen. The lungs are well-aerated bilaterally  without focal confluent infiltrate. No sizable effusion is noted.  IMPRESSION:  No acute abnormalities seen.  Original Report Authenticated By: [name], M.D.  02/25/13- 72 yoF followed for COPD, sleep apnea/ failed CPAP, complicated by pulmonary hypertension, CAD/ Hx MI. PCP Dr Maurice Small, Card- Dr Mayford Knife     Daughter here c/o feeling fatigued, dizziness,  and increased DOE x 2 wks.  No cough, wheezing, chest tightness, or chest pain noted at this time. Note med list includes Spiriva and [place]. O2 2L/ [facility] She thinks she has been getting too much Lasix, 80 mg daily. Dr. Mayford Knife is away and her office wants to send her to ER. Frequent bathroom trips, orthostatic. Not taking either Tudorza or Spiriva.no infection. Back surgery with no respiratory problem June.  08/29/13- 72 yoF followed for COPD, sleep apnea/ failed CPAP, complicated by pulmonary hypertension, CAD/ Hx MI. PCP Dr Maurice Small, Card- Dr Mayford Knife     Daughter here Follows For: SOB worse on exertion - Diff carrying purse and managing oxygen tanks - Wears oxygen all the timewhen at home - Denies cough or wheezing Exercise here- room air walking 08/29/13- desat to  88%. Says her oxygen tank is too heavy and asks about portable concentrator.  10/10/13-73 yoF followed for COPD, sleep apnea/ failed CPAP, complicated by pulmonary hypertension, CAD/ Hx MI. PCP Dr Maurice Small, Card- Dr Mayford Knife     FOLLOWS FOR: SOB when walking; hard to carry the O2 tanks-insurance denied for her to have portable tanks.  O2 2L/ [facility] sleep and portable Daughter had esophageal cancer and just died from brain metastasis. Husband is BKA in wheelchair. She is having trouble coping. Patient is finding her current portable oxygen too heavy Reports little cough or wheeze.  04/15/14- 73 yoF followed for COPD, sleep apnea/ failed CPAP, complicated by pulmonary hypertension, CAD/ Hx MI. PCP Dr Maurice Small, Card- Dr Mayford Knife FOLLOWS FOR: Pt c/o runny nose, SOB with exertion, sinus congestion, PND, cough due to the PND. O2 2L/ [facility]    Review of Systems- see HPI Constitutional:   + weight loss, night sweats, no-Fevers, chills,No- fatigue, lassitude. HEENT:   + headaches,  No-Difficulty swallowing,  Tooth/dental problems, Sore throat,                No-sneezing, itching, ear ache, nasal congestion, CV:  No chest pain, orthopnea, PND, +swelling in lower extremities, no-anasarca, dizziness, palpitations GI  No heartburn, indigestion, abdominal pain, nausea, vomiting, ,  Resp:   No excess mucus, no productive cough,  + non-productive cough,  No coughing up of blood.  No change in color of mucus.  No wheezing.   Skin: no rash or lesions. GU: . MS:  No joint pain or swelling.   Psych:  No change in mood or affect. No depression +anxiety.  No memory loss.  Objective:   Physical Exam  General- +Alert, Oriented, Affect-appropriate, Distress- none acute   Obese.      On O2 2L sat 91% at rest   Wheelchair. Skin- rash-none, lesions- none, excoriation- none Lymphadenopathy- none Head- atraumatic            Eyes- Gross vision intact, PERRLA, conjunctivae clear  secretions            Ears- Hearing, canals normal            Nose- Clear, No-septal dev, mucus, polyps, erosion, perforation             Throat- Mallampati III , mucosa clear/ dry , drainage- none, tonsils- atrophic. Large                                             tongue. Neck- flexible , trachea midline, no stridor , thyroid nl, carotid no bruit Chest - symmetrical excursion , unlabored           Heart/CV- Rapid RR , no murmur heard today , no gallop  , no rub, nl s1 s2                           - JVD- none , edema-none, stasis changes- none, varices- none.  Homan's NEG.           Lung- +distant. Clear to P&A, wheeze- none , dullness-none, rub- none           Chest wall-  Abd-  Br/ Gen/ Rectal- Not done, not indicated Extrem- cyanosis- none, clubbing, none, atrophy- none, strength- nl Neuro- grossly intact to observation  Assessment & Plan:

## 2014-04-27 ENCOUNTER — Emergency Department (HOSPITAL_COMMUNITY)
Admission: EM | Admit: 2014-04-27 | Discharge: 2014-04-27 | Disposition: A | Discharge status: Home / Self Care | Payer: Medicare Other | Attending: Emergency Medicine | Admitting: Emergency Medicine

## 2014-04-27 ENCOUNTER — Emergency Department (HOSPITAL_COMMUNITY): Payer: Medicare Other

## 2014-04-27 ENCOUNTER — Encounter (HOSPITAL_COMMUNITY): Payer: Self-pay | Admitting: Emergency Medicine

## 2014-04-27 DIAGNOSIS — Z951 Presence of aortocoronary bypass graft: Secondary | ICD-10-CM | POA: Diagnosis not present

## 2014-04-27 DIAGNOSIS — Z7901 Long term (current) use of anticoagulants: Secondary | ICD-10-CM | POA: Diagnosis not present

## 2014-04-27 DIAGNOSIS — I2510 Atherosclerotic heart disease of native coronary artery without angina pectoris: Secondary | ICD-10-CM | POA: Diagnosis not present

## 2014-04-27 DIAGNOSIS — K279 Peptic ulcer, site unspecified, unspecified as acute or chronic, without hemorrhage or perforation: Secondary | ICD-10-CM | POA: Diagnosis not present

## 2014-04-27 DIAGNOSIS — I10 Essential (primary) hypertension: Secondary | ICD-10-CM | POA: Diagnosis not present

## 2014-04-27 DIAGNOSIS — W540XXA Bitten by dog, initial encounter: Secondary | ICD-10-CM | POA: Diagnosis not present

## 2014-04-27 DIAGNOSIS — Z79899 Other long term (current) drug therapy: Secondary | ICD-10-CM | POA: Diagnosis not present

## 2014-04-27 DIAGNOSIS — J449 Chronic obstructive pulmonary disease, unspecified: Secondary | ICD-10-CM | POA: Diagnosis not present

## 2014-04-27 DIAGNOSIS — K219 Gastro-esophageal reflux disease without esophagitis: Secondary | ICD-10-CM | POA: Diagnosis not present

## 2014-04-27 DIAGNOSIS — Z87891 Personal history of nicotine dependence: Secondary | ICD-10-CM | POA: Diagnosis not present

## 2014-04-27 DIAGNOSIS — Z8619 Personal history of other infectious and parasitic diseases: Secondary | ICD-10-CM | POA: Diagnosis not present

## 2014-04-27 DIAGNOSIS — F329 Major depressive disorder, single episode, unspecified: Secondary | ICD-10-CM | POA: Diagnosis not present

## 2014-04-27 DIAGNOSIS — S61551A Open bite of right wrist, initial encounter: Secondary | ICD-10-CM

## 2014-04-27 DIAGNOSIS — M1990 Unspecified osteoarthritis, unspecified site: Secondary | ICD-10-CM | POA: Insufficient documentation

## 2014-04-27 DIAGNOSIS — F419 Anxiety disorder, unspecified: Secondary | ICD-10-CM | POA: Diagnosis not present

## 2014-04-27 DIAGNOSIS — E785 Hyperlipidemia, unspecified: Secondary | ICD-10-CM | POA: Diagnosis not present

## 2014-04-27 MED ORDER — HYDROCODONE-ACETAMINOPHEN 5-325 MG PO TABS: 1.0000 | ORAL_TABLET | Freq: Four times a day (QID) | ORAL | Status: DC | PRN

## 2014-04-27 MED ORDER — AMOXICILLIN-POT CLAVULANATE 500-125 MG PO TABS: 1.0000 | ORAL_TABLET | Freq: Three times a day (TID) | ORAL | Status: DC

## 2014-04-27 MED ORDER — BACITRACIN ZINC 500 UNIT/GM EX OINT
TOPICAL_OINTMENT | Freq: Two times a day (BID) | CUTANEOUS | Status: DC
  Administered 2014-04-27: 23:00:00 via TOPICAL

## 2014-04-27 MED ORDER — AMOXICILLIN-POT CLAVULANATE 875-125 MG PO TABS
1.0000 | ORAL_TABLET | Freq: Once | ORAL | Status: AC
  Administered 2014-04-27: 1 via ORAL
  Filled 2014-04-27: qty 1

## 2014-04-27 MED ORDER — LIDOCAINE HCL 2 % IJ SOLN
INTRAMUSCULAR | Status: AC
  Administered 2014-04-27: 20 mg
  Filled 2014-04-27: qty 20

## 2014-04-27 NOTE — ED Notes (Signed)
Bed: WA21 Expected date:  Expected time:  Means of arrival:  Comments: EMS 73 yo female with dog bite

## 2014-04-27 NOTE — Discharge Instructions (Signed)
Animal Bite Take Tylenol for mild pain or the pain medicine prescribed for bad pain. Wash the wounds daily with soap and water and then place a thin layer of antibiotic ointment over the wounds and cover with a sterile bandage. Get your wounds recheck in 3 days by Dr. Valentina Lucks or an urgent care Animal bite wounds can get infected. It is important to get proper medical treatment. Ask your doctor if you need a rabies shot. HOME CARE   Follow your doctor's instructions for taking care of your wound.  Only take medicine as told by your doctor.  Take your medicine (antibiotics) as told. Finish them even if you start to feel better.  Keep all doctor visits as told. You may need a tetanus shot if:   You cannot remember when you had your last tetanus shot.  You have never had a tetanus shot.  The injury broke your skin. If you need a tetanus shot and you choose not to have one, you may get tetanus. Sickness from tetanus can be serious. GET HELP RIGHT AWAY IF:   Your wound is warm, red, sore, or puffy (swollen).  You notice yellowish-white fluid (pus) or a bad smell coming from the wound.  You see a red line on the skin coming from the wound.  You have a fever, chills, or you feel sick.  You feel sick to your stomach (nauseous), or you throw up (vomit).  Your pain does not go away, or it gets worse.  You have trouble moving the injured part.  You have questions or concerns. MAKE SURE YOU:   Understand these instructions.  Will watch your condition.  Will get help right away if you are not doing well or get worse. Document Released: 07/11/2005 Document Revised: 10/03/2011 Document Reviewed: 03/02/2011 [facility] Patient Information 2015 [place], [place]. This information is not intended to replace advice given to you by your health care provider. Make sure you discuss any questions you have with your health care provider.

## 2014-04-27 NOTE — ED Provider Notes (Signed)
CSN: [id number]     Arrival date & time 04/27/14  2058 History   First MD Initiated Contact with Patient 04/27/14 2135     Chief Complaint  Patient presents with  . Animal Bite     (Consider location/radiation/quality/duration/timing/severity/associated sxs/prior Treatment) HPI Patient was bitten by her own dog 8:30 PM today on right wrist. No other injury she complains of right wrist pain worse with movement pain is moderate improved with remaining still. No other associated injury. Last tetanus immunization 213. Dog is up-to-date on immunizations. Past Medical History  Diagnosis Date  . COPD (chronic obstructive pulmonary disease)   . Pulmonary HTN     PASP 50-91mmHg by [id number]  . H/O: rheumatic fever   . History of rheumatic heart disease     X 3-LAST TIME WHEN PATIENT WAS 73 YRS OLD  . Sleep apnea     intolerant to CPAP  . Hypertension   . H/O right bundle branch block   . Bursitis, shoulder     bilateral  . GERD (gastroesophageal reflux disease)   . H/O hiatal hernia   . Anxiety   . Depression   . Varicose veins   . PUD (peptic ulcer disease)   . 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 DJD (degenerative joint disease)   . OA (osteoarthritis)   . Hyperlipidemia   . Urinary incontinence   . Pulmonary HTN   . Thyroid nodule   . CHF (congestive heart failure)     chronic diasotlic CHF   Past Surgical History  Procedure Laterality Date  . Bilateral salpingoophorectomy    . Vaginal delivery      x4  . Thyroidectomy, partial  2010    Dr Michaell Cowing  . Tubal ligation    . Total abdominal hysterectomy      1990  . Coronary artery bypass graft  06/21/2000    x4  . Cardiac catheterization  02/11/2008, 01/30/2004, 06/14/2000  . Bladder tack     Family History  Problem Relation Age of Onset  . Anemia Mother   . Emphysema Father   . Esophageal cancer Daughter    History  Substance Use Topics  . Smoking status: Former Smoker -- 1.00 packs/day for 47 years     Types: Cigarettes    Quit date: 07/26/2007  . Smokeless tobacco: Never Used  . Alcohol Use: No   OB History   Grav Para Term Preterm Abortions TAB SAB Ect Mult Living                 Review of Systems  Constitutional: Negative.   Skin: Positive for wound.       Dog bite right wrist  Hematological: Bruises/bleeds easily.       On blood thinner      Allergies  Prednisone; Xopenex; Codeine; Lipitor; and Ventolin  Home Medications   Prior to Admission medications   Medication Sig Start Date End Date Taking? Authorizing Provider  B-12, Methylcobalamin, 1000 MCG SUBL Place under the tongue.   Yes Historical Provider, MD  calcium-vitamin D (OSCAL WITH D) 250-125 MG-UNIT per tablet Take 1 tablet by mouth daily.   Yes Historical Provider, MD  Co-Enzyme Q10 200 MG CAPS Take 200 mg by mouth daily. 05/15/13  Yes Quintella Reichert, MD  digoxin (LANOXIN) 0.125 MG tablet take 1 tablet by mouth once daily   Yes Quintella Reichert, MD  fexofenadine (ALLEGRA) 180 MG tablet Take 180 mg by mouth daily.  Yes Historical Provider, MD  furosemide (LASIX) 80 MG tablet take 1 tablet by mouth once daily then 1 EXTRA ONLY IF NEEDED as directed   Yes Quintella Reichertraci R Turner, MD  gabapentin (NEURONTIN) 100 MG capsule Take 2 capsules by mouth 2 (two) times daily. 2 in am 2 in pm 10/04/13  Yes Historical Provider, MD  metoprolol (LOPRESSOR) 50 MG tablet take 1 tablet by mouth twice a day 01/13/14  Yes Quintella Reichertraci R Turner, MD  nitroGLYCERIN (NITROSTAT) 0.4 MG SL tablet Place 1 tablet (0.4 mg total) under the tongue every 5 (five) minutes as needed for chest pain. 12/23/13  Yes Quintella Reichertraci R Turner, MD  omeprazole (PRILOSEC OTC) 20 MG tablet Take 20 mg by mouth daily.   Yes Historical Provider, MD  potassium chloride SA (KLOR-CON M20) 20 MEQ tablet Take 1 tablet (20 mEq total) by mouth 2 (two) times daily. 03/03/14  Yes Quintella Reichertraci R Turner, MD  rivaroxaban (XARELTO) 20 MG TABS tablet Take 20 mg by mouth at bedtime.   Yes Historical Provider, MD   sertraline (ZOLOFT) 100 MG tablet Take 100 mg by mouth at bedtime.    Yes Historical Provider, MD  simvastatin (ZOCOR) 20 MG tablet Take 1 tablet (20 mg total) by mouth every evening. 01/31/14  Yes Quintella Reichertraci R Turner, MD   BP 138/90  Pulse 87  Temp(Src) 98.1 F (36.7 C) (Oral)  Resp 18  SpO2 96% Physical Exam  Constitutional: She appears well-developed and well-nourished. No distress.  HENT:  Head: Normocephalic and atraumatic.  Eyes: EOM are normal.  Neck: Neck supple.  Cardiovascular: Normal rate.   Pulmonary/Chest: Effort normal.  Abdominal: She exhibits no distension.  Musculoskeletal:  Right upper extremity 4 puncture wounds at dorsal wrist in the ulnar wrist. Slight surrounding hematoma. With tenderness. No active bleeding. Radial pulse 2+. Full range of motion with pain all other extremities without contusion abrasion or tenderness neurovascularly    ED Course  Procedures (including critical care time) Labs Review Labs Reviewed - No data to display  Imaging Review No results found.   EKG Interpretation None     Wounds were numbed locally with 2% Xylocaine, copiously irrigated with saline bacitracin ointment placed on wounds with sterile bandage Xray viewed by me. Results for orders placed in visit on 12/23/13  BASIC METABOLIC PANEL      Result Value Ref Range   Sodium 142  135 - 145 mEq/L   Potassium 3.6  3.5 - 5.1 mEq/L   Chloride 97  96 - 112 mEq/L   CO2 37 (*) 19 - 32 mEq/L   Glucose, Bld 118 (*) 70 - 99 mg/dL   BUN 12  6 - 23 mg/dL   Creatinine, Ser 0.8  0.4 - 1.2 mg/dL   Calcium 9.3  8.4 - 16.110.5 mg/dL   GFR 09.6073.42  >45.40>60.00 mL/min   Dg Wrist Complete Right  04/27/2014   CLINICAL DATA:  An normal bite over the lateral/ radial and ulnar/medial side of wrist.  EXAM: RIGHT WRIST - COMPLETE 3+ VIEW  COMPARISON:  None.  FINDINGS: Degenerative changes of the radiocarpal joint and scaphotrapezium joint as well as the first and second carpometacarpal joints. No acute  fracture or dislocation. No foreign body.  IMPRESSION: No acute findings.   Electronically Signed   By: Elberta Fortisaniel  Boyle M.D.   On: 04/27/2014 22:03    MDM  Plan prescription Augmentin, Norco. Wound check 3 days. Local wound care Diagnosis dog bite of right wrist Final diagnoses:  None  Doug Sou, MD 04/27/14 2310

## 2014-04-27 NOTE — ED Notes (Signed)
Per EMS, pt was giving her dog medication when he bit her on her right wrist. Pt has bite wounds and skin tear to right wrist. Pt states the dog is up to date on his rabies shots. Pt states she has 5/10 pain when moving/touching her right wrist. Pt A&Ox4

## 2014-05-01 ENCOUNTER — Encounter: Payer: Self-pay | Admitting: Cardiology

## 2014-05-14 ENCOUNTER — Other Ambulatory Visit: Payer: Self-pay | Admitting: Physician Assistant

## 2014-05-14 ENCOUNTER — Ambulatory Visit
Admission: RE | Admit: 2014-05-14 | Discharge: 2014-05-14 | Disposition: A | Discharge status: Home / Self Care | Payer: Medicare Other | Source: Ambulatory Visit | Attending: Physician Assistant | Admitting: Physician Assistant

## 2014-05-14 DIAGNOSIS — W540XXA Bitten by dog, initial encounter: Secondary | ICD-10-CM

## 2014-05-19 ENCOUNTER — Other Ambulatory Visit: Payer: Self-pay | Admitting: Orthopedic Surgery

## 2014-07-07 ENCOUNTER — Other Ambulatory Visit: Payer: Self-pay | Admitting: *Deleted

## 2014-07-07 ENCOUNTER — Telehealth: Payer: Self-pay | Admitting: *Deleted

## 2014-07-07 MED ORDER — POTASSIUM CHLORIDE CRYS ER 20 MEQ PO TBCR: 20.0000 meq | EXTENDED_RELEASE_TABLET | Freq: Two times a day (BID) | ORAL | Status: DC

## 2014-07-07 MED ORDER — DIGOXIN 125 MCG PO TABS: 125.0000 ug | ORAL_TABLET | Freq: Every day | ORAL | Status: DC

## 2014-07-07 NOTE — Telephone Encounter (Signed)
PA for Xarelto sent via CoverMyMeds on 07/04/2014. Request denied, more information needed. Further documentation faxed today.

## 2014-07-08 ENCOUNTER — Encounter: Payer: Self-pay | Admitting: Cardiology

## 2014-07-08 ENCOUNTER — Telehealth: Payer: Self-pay

## 2014-07-08 ENCOUNTER — Ambulatory Visit (INDEPENDENT_AMBULATORY_CARE_PROVIDER_SITE_OTHER): Payer: Medicare Other | Admitting: Cardiology

## 2014-07-08 ENCOUNTER — Telehealth: Payer: Self-pay | Admitting: Cardiology

## 2014-07-08 VITALS — BP 132/60 | HR 63 | Ht 66.0 in | Wt 180.4 lb

## 2014-07-08 DIAGNOSIS — I4892 Unspecified atrial flutter: Secondary | ICD-10-CM

## 2014-07-08 DIAGNOSIS — I2510 Atherosclerotic heart disease of native coronary artery without angina pectoris: Secondary | ICD-10-CM

## 2014-07-08 DIAGNOSIS — E785 Hyperlipidemia, unspecified: Secondary | ICD-10-CM

## 2014-07-08 DIAGNOSIS — I10 Essential (primary) hypertension: Secondary | ICD-10-CM

## 2014-07-08 DIAGNOSIS — Z7901 Long term (current) use of anticoagulants: Secondary | ICD-10-CM

## 2014-07-08 DIAGNOSIS — I5032 Chronic diastolic (congestive) heart failure: Secondary | ICD-10-CM

## 2014-07-08 LAB — BASIC METABOLIC PANEL
BUN: 14 mg/dL (ref 6–23)
CALCIUM: 9.3 mg/dL (ref 8.4–10.5)
CO2: 35 mEq/L — ABNORMAL HIGH (ref 19–32)
CREATININE: 1.1 mg/dL (ref 0.4–1.2)
Chloride: 96 mEq/L (ref 96–112)
GFR: 52.04 mL/min — ABNORMAL LOW (ref >60.00)
Glucose, Bld: 114 mg/dL — ABNORMAL HIGH (ref 70–99)
Potassium: 3.1 mEq/L — ABNORMAL LOW (ref 3.5–5.1)
SODIUM: 138 meq/L (ref 135–145)

## 2014-07-08 MED ORDER — POTASSIUM CHLORIDE CRYS ER 20 MEQ PO TBCR: EXTENDED_RELEASE_TABLET | ORAL | Status: DC

## 2014-07-08 NOTE — Telephone Encounter (Signed)
New Msg    Gemil calling from [facility] appeals, has an appeal for a medication request.     States she will be sending fax request about medication and reply is needed within next business day.

## 2014-07-08 NOTE — Telephone Encounter (Signed)
-----   Message from Quintella Reichert, MD sent at 07/08/2014  4:52 PM EST ----- Please find out if patient has missed any potassium doses.  Please have her take Kdur 2 tablets now and then take her normal nightly Kdur later tonight.  If she has not missed any doses then increase kdur to 40 meq qam and q pm

## 2014-07-08 NOTE — Patient Instructions (Addendum)
Your physician recommends that you continue on your current medications as directed. Please refer to the Current Medication list given to you today.  Your physician recommends that you have lab work TODAY (BMET, Dig).  Your physician wants you to follow-up in: 6 months with Dr. Mayford Knife. You will receive a reminder letter in the mail two months in advance. If you don't receive a letter, please call our office to schedule the follow-up appointment.

## 2014-07-08 NOTE — Telephone Encounter (Signed)
Instructed patient to take 2 tablets Kdur (total 40 meq) now and then her regular nightly 20 meq dose. Since patient has not missed any doses, instructed patient to INCREASE kdur to 40 meq in the AM and 20 meq at night. Patient agrees with treatment plan.

## 2014-07-08 NOTE — Progress Notes (Signed)
[address] [place], [place]  [address] Phone: [phone] Fax:  [phone]  Date:  07/08/2014   ID:  [name], DOB 1941-03-30, MRN [id number]  PCP:  Astrid Divine, MD  Cardiologist:  Armanda Magic, MD    History of Present Illness: [name] is a 73 y.o. female with a history of chronic diastolic CHF, moderate pulmonary HTN, HTN, CAD and dyslipidemia who presents today for followup. She is doing well from a cardiac standpoint. She denies any chest pain, LE edema, dizziness, palpitations or syncope. She has chronic SOB due to COPD and is on chronic O2.    Wt Readings from Last 3 Encounters:  07/08/14 180 lb 6.4 oz (81.829 kg)  04/15/14 188 lb (85.276 kg)  12/23/13 192 lb (87.091 kg)     Past Medical History  Diagnosis Date  . COPD (chronic obstructive pulmonary disease)   . Pulmonary HTN     PASP 50-68mmHg by [id number]  . H/O: rheumatic fever   . History of rheumatic heart disease     X 3-LAST TIME WHEN PATIENT WAS 73 YRS OLD  . Sleep apnea     intolerant to CPAP  . Hypertension   . H/O right bundle branch block   . Bursitis, shoulder     bilateral  . GERD (gastroesophageal reflux disease)   . H/O hiatal hernia   . Anxiety   . Depression   . Varicose veins   . PUD (peptic ulcer disease)   . 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 DJD (degenerative joint disease)   . OA (osteoarthritis)   . Hyperlipidemia   . Urinary incontinence   . Pulmonary HTN   . Thyroid nodule   . CHF (congestive heart failure)     chronic diasotlic CHF    Current Outpatient Prescriptions  Medication Sig Dispense Refill  . B-12, Methylcobalamin, 1000 MCG SUBL Place under the tongue.    . digoxin (LANOXIN) 0.125 MG tablet Take 1 tablet (125 mcg total) by mouth daily. 30 tablet 0  . fexofenadine (ALLEGRA) 180 MG tablet Take 180 mg by mouth daily as needed for allergies.     . furosemide (LASIX) 80 MG tablet take 1 tablet by mouth once daily then 1  EXTRA ONLY IF NEEDED as directed 45 tablet 6  . gabapentin (NEURONTIN) 100 MG capsule Take 2 capsules by mouth 2 (two) times daily. 2 in am 2 in pm    . metoprolol (LOPRESSOR) 50 MG tablet take 1 tablet by mouth twice a day 60 tablet 5  . nitroGLYCERIN (NITROSTAT) 0.4 MG SL tablet Place 1 tablet (0.4 mg total) under the tongue every 5 (five) minutes as needed for chest pain. 25 tablet 3  . omeprazole (PRILOSEC OTC) 20 MG tablet Take 20 mg by mouth daily.    . potassium chloride SA (KLOR-CON M20) 20 MEQ tablet Take 1 tablet (20 mEq total) by mouth 2 (two) times daily. 60 tablet 0  . rivaroxaban (XARELTO) 20 MG TABS tablet Take 20 mg by mouth at bedtime.    . sertraline (ZOLOFT) 100 MG tablet Take 100 mg by mouth at bedtime.     . simvastatin (ZOCOR) 20 MG tablet Take 1 tablet (20 mg total) by mouth every evening. 30 tablet 5  . Co-Enzyme Q10 200 MG CAPS Take 200 mg by mouth daily. (Patient not taking: Reported on 07/08/2014)     No current facility-administered medications for this visit.  Allergies:    Allergies  Allergen Reactions  . Prednisone     jittery  . Xopenex [Levalbuterol]     Made mouth and throat sore  . Codeine   . Lipitor [Atorvastatin]     Muscle aches  . Ventolin [Kdc:Albuterol]     "jittery"    Social History:  The patient  reports that she quit smoking about 6 years ago. Her smoking use included Cigarettes. She has a 47 pack-year smoking history. She has never used smokeless tobacco. She reports that she does not drink alcohol or use illicit drugs.   Family History:  The patient's family history includes Anemia in her mother; Emphysema in her father; Esophageal cancer in her daughter.   ROS:  Please see the history of present illness.      All other systems reviewed and negative.   PHYSICAL EXAM: VS:  BP 132/60 mmHg  Pulse 63  Ht 5\' 6"  (1.676 m)  Wt 180 lb 6.4 oz (81.829 kg)  BMI 29.13 kg/m2 Well nourished, well developed, in no acute distress HEENT:  normal Neck: no JVD Cardiac:  normal S1, S2; RRR; no murmur Lungs:  clear to auscultation bilaterally, no wheezing, rhonchi or rales Abd: soft, nontender, no hepatomegaly Ext: no edema  EKG:  NSR with RBBB, LAFB, LVH with repolarization changes Skin: warm and dry Neuro:  CNs 2-12 intact, no focal abnormalities noted  ASSESSMENT AND PLAN:  1. Dyslipidemia - followed by PCP 2. Chronic diastolic CHF - well compensated - continue Lasix/potassium/BB - check BMET 3. HTN - well controlled - continue Metoprolol   4. CAD  5. Paroxysmal atrial flutter  - Her CHADS VASC score is 4 (age > 4765, female, HTN, CHF) continue Xarelto/metoprolol/digoxin - check Dig level  Followup with me in 6 months   Signed, Armanda Magicraci Carron Jaggi, MD [facility] 07/08/2014 1:33 PM

## 2014-07-09 ENCOUNTER — Other Ambulatory Visit: Payer: Self-pay | Admitting: *Deleted

## 2014-07-09 ENCOUNTER — Telehealth: Payer: Self-pay | Admitting: Cardiology

## 2014-07-09 ENCOUNTER — Other Ambulatory Visit: Payer: Self-pay

## 2014-07-09 LAB — DIGOXIN LEVEL: DIGOXIN LVL: 1 ng/mL (ref 0.8–2.0)

## 2014-07-09 MED ORDER — SIMVASTATIN 20 MG PO TABS: 20.0000 mg | ORAL_TABLET | Freq: Every evening | ORAL | Status: DC

## 2014-07-09 MED ORDER — POTASSIUM CHLORIDE CRYS ER 20 MEQ PO TBCR: EXTENDED_RELEASE_TABLET | ORAL | Status: DC

## 2014-07-09 MED ORDER — RIVAROXABAN 20 MG PO TABS: 20.0000 mg | ORAL_TABLET | Freq: Every day | ORAL | Status: DC

## 2014-07-09 MED ORDER — METOPROLOL TARTRATE 50 MG PO TABS: 50.0000 mg | ORAL_TABLET | Freq: Two times a day (BID) | ORAL | Status: DC

## 2014-07-09 MED ORDER — DIGOXIN 125 MCG PO TABS: 125.0000 ug | ORAL_TABLET | Freq: Every day | ORAL | Status: DC

## 2014-07-09 MED ORDER — FUROSEMIDE 80 MG PO TABS: ORAL_TABLET | ORAL | Status: DC

## 2014-07-09 NOTE — Telephone Encounter (Signed)
New message       Talk about an appeal regarding xarelto.  Please call

## 2014-07-09 NOTE — Telephone Encounter (Signed)
Called patient to let her know Xarelto had been approved, left message.

## 2014-07-09 NOTE — Telephone Encounter (Signed)
Addressed by Julaine Hua in other phone encounter.

## 2014-07-09 NOTE — Telephone Encounter (Signed)
PA for Xarelto approved through 07/26/2015.

## 2014-07-10 ENCOUNTER — Telehealth: Payer: Self-pay | Admitting: Cardiology

## 2014-07-10 NOTE — Telephone Encounter (Signed)
Returned call to [facility] with [facility] no answer.Unable to leave a message no answering machine.

## 2014-07-10 NOTE — Telephone Encounter (Signed)
New Msg    Genil from [facility] calling, fax request was sent yesterday about an appeal for medication request but hasn't received a reply.    Please Fax # [phone] with info or call Genil  at [phone] ext [phone].

## 2014-07-11 ENCOUNTER — Telehealth: Payer: Self-pay | Admitting: Cardiology

## 2014-07-11 NOTE — Telephone Encounter (Signed)
Forward to refills 

## 2014-07-11 NOTE — Telephone Encounter (Signed)
To Julaine Hua, RN for review.

## 2014-07-11 NOTE — Telephone Encounter (Signed)
Assured patient that her Xarelto had been approved. Patient stated that PCP has made an appointment for her with a urologist for blood in her urine that she has been experiencing.

## 2014-07-11 NOTE — Telephone Encounter (Signed)
[employer] NEEDS A CALL RE PT'S APPEAL RE HER MEDICATION, PLS CALL

## 2014-08-01 ENCOUNTER — Other Ambulatory Visit: Payer: Self-pay | Admitting: *Deleted

## 2014-08-01 MED ORDER — POTASSIUM CHLORIDE CRYS ER 20 MEQ PO TBCR: EXTENDED_RELEASE_TABLET | ORAL | Status: DC

## 2014-08-13 ENCOUNTER — Other Ambulatory Visit: Payer: Self-pay | Admitting: Dermatology

## 2014-10-03 ENCOUNTER — Encounter: Payer: Self-pay | Admitting: Cardiology

## 2014-10-14 ENCOUNTER — Encounter: Payer: Self-pay | Admitting: Internal Medicine

## 2014-10-14 ENCOUNTER — Ambulatory Visit: Payer: Medicare Other | Admitting: Internal Medicine

## 2014-10-14 ENCOUNTER — Ambulatory Visit (INDEPENDENT_AMBULATORY_CARE_PROVIDER_SITE_OTHER)
Admission: RE | Admit: 2014-10-14 | Discharge: 2014-10-14 | Disposition: A | Discharge status: Home / Self Care | Payer: Medicare Other | Source: Ambulatory Visit | Attending: Internal Medicine | Admitting: Internal Medicine

## 2014-10-14 ENCOUNTER — Ambulatory Visit (INDEPENDENT_AMBULATORY_CARE_PROVIDER_SITE_OTHER): Payer: Medicare Other | Admitting: Internal Medicine

## 2014-10-14 VITALS — BP 122/68 | HR 67 | Ht 66.0 in | Wt 184.4 lb

## 2014-10-14 DIAGNOSIS — I5032 Chronic diastolic (congestive) heart failure: Secondary | ICD-10-CM

## 2014-10-14 DIAGNOSIS — J439 Emphysema, unspecified: Secondary | ICD-10-CM

## 2014-10-14 NOTE — Patient Instructions (Signed)
Suggest otc antihistamine Claritin/ loratadine as needed for the runny nose this spirng. You can also use the nose spray while needed.  Order- CXR   Dx COPD with emphysema

## 2014-10-14 NOTE — Progress Notes (Signed)
Patient ID: [name], female    DOB: 05-12-1941, 74 y.o.   MRN: [id number]  HPI 11/26/10-SATURATION QUALIFICATIONS: Patient Saturations on Room Air while Ambulating = 87% Patient Saturations on 2 Liters of oxygen while Ambulating = 96%[name],CMA   11/26/10 HPI: 78 yoF followed for COPD, sleep apnea, complicated by pulmonary hypertension. Last here July 29, 2010. PFT reviewed then- severe COPD FEV1 0.88/ 38%. Pollen season causing watery nose.  She took advice from a tv ad and changed her O2 DME to a [employer], but now wants to change back to [facility]. She continues O2 mostly at night. Wearing heart monitor because of tachypalpitation/ Dr [name].. Back pain/ [name] limits her acitivity as much as her dyspnea.  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   04/01/11-  70 yoF followed for COPD, sleep apnea, complicated by pulmonary hypertension. Heart monitor results ok- Dr [name]- for tachypalpitation. Back pain was slowing her down. She got first epidural steroid injection this week.  Dr [name] was following for THYROID nodule with hx partial thyroidectomy.  Flu vax talk.  She stays in when hot outdoors, rather than bothering with her portable oxygen. Continues O2 2L [facility] for sleep. Feels she is breathing some better lately, not worse. Says CPAP broke her face out- failed to tolerate.   11/30/11-  70 yoF followed for COPD, sleep apnea, complicated by pulmonary hypertension. PCP Dr [name], Card- Dr [name] hospital visit. Hospitalized April 10-14 according to family although discharge summary indicates April 10 through April 18. 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 History of intolerance to CPAP. CODE STATUS during this hospitalization was DO NOT RESUSCITATE. She and her daughter indicate that  she is feeling much better. Now off prednisone, oxygen added.  She describes a new dry cough and is noted to be on an ACE inhibitor which we discussed as one possible cause for the cough. Home oxygen continuous and portable 2 L/[facility]. CXR- 11/04/11- images reviewed IMPRESSION:  Volume loss at the left lung base. Streaky opacity is favored to  reflect atelectasis but infection is difficult to exclude. No  pulmonary edema or pleural effusions.  Original Report Authenticated By: [name], M.D.  ECHO- EF 65-70%. Grade 2 diastolic dysfunction. Mild aortic valve regurgitation. Mild to moderate mitral stenosis with mild mitral regurgitation. Left atrium mildly dilated.  04/03/12-  70 yoF followed for COPD, sleep apnea/ failed CPAP, complicated by pulmonary hypertension. PCP Dr [name], Card- Dr [name] Breathing at baseline, using O2 2L/ [facility] for sleep. She notices that her upper eyelids get red without irritation of the globes. She has been having fall season related nasal congestion. Using no regular medication, just her oxygen. She failed to tolerate CPAP and just wears oxygen.  08/27/12- 71 yoF followed for COPD, sleep apnea/ failed CPAP, complicated by pulmonary hypertension. PCP Dr [name], Card- Dr [name] FOR: still SOB at times; coughs when feels nose is running Blames cough on post nasal drip noted to "allergy". Benadryl over dries and makes head itch. Wearing oxygen 2 L/Advanced for sleep. Little cough. No chest pain, swelling, palpitation or blood.  10/26/12-  71 yoF followed for COPD, sleep apnea/ failed CPAP, complicated by pulmonary hypertension. PCP Dr [name], Card- Dr [name]   Family here. Follow up to discuss back surgery.  Reports breathing has worsened over  the past week.  Has SOB with very little activity, chest tightness with SOB, runny nose with clear drainage, and nonprod cough.   Gradually increasing dyspnea on exertion, limited as much by  weakness. Some cough, no wheeze, nose drips. No chest pain. Allegra helps rhinitis. Continuous O2 2L/min/ [facility].  Feet swell, left more than right-chronic, dependent. No calf pain and no acute change. No new heart problems/Dr. Turner/[facility].  12/04/12- 72 yoF followed for COPD, sleep apnea/ failed CPAP, complicated by pulmonary hypertension. PCP Dr [name], Card- Dr [name]     Daughter here ACUTE VISIT: runny nose, sore throat, cough-gagging-non productive, chills. Son has recently been sick.  Increased SOB as well. 1 week frontal headache, watery rhinorhea, scratchy throat, dry cough. Began w/ fever/ chill. O2 2L/ Advanced CXR 10/29/12 Findings: The heart is again enlarged in size. Postsurgical  changes are again seen. The lungs are well-aerated bilaterally  without focal confluent infiltrate. No sizable effusion is noted.  IMPRESSION:  No acute abnormalities seen.  Original Report Authenticated By: [name], M.D.  02/25/13- 72 yoF followed for COPD, sleep apnea/ failed CPAP, complicated by pulmonary hypertension, CAD/ Hx MI. PCP Dr [name], Card- Dr [name]     Daughter here c/o feeling fatigued, dizziness,  and increased DOE x 2 wks.  No cough, wheezing, chest tightness, or chest pain noted at this time. Note med list includes Spiriva and [place]. O2 2L/ Advanced She thinks she has been getting too much Lasix, 80 mg daily. Dr. Mayford Lloyd is away and her office wants to send her to ER. Frequent bathroom trips, orthostatic. Not taking either Tudorza or Spiriva.no infection. Back surgery with no respiratory problem June.  08/29/13- 72 yoF followed for COPD, sleep apnea/ failed CPAP, complicated by pulmonary hypertension, CAD/ Hx MI. PCP Dr [name], Card- Dr [name]     Daughter here Follows For: SOB worse on exertion - Diff carrying purse and managing oxygen tanks - Wears oxygen all the timewhen at home - Denies cough or wheezing Exercise here- room air walking 08/29/13- desat to  88%. Says her oxygen tank is too heavy and asks about portable concentrator.  10/10/13-73 yoF followed for COPD, sleep apnea/ failed CPAP, complicated by pulmonary hypertension, CAD/ Hx MI. PCP Dr [name], Card- Dr [name]     FOLLOWS FOR: SOB when walking; hard to carry the O2 tanks-insurance denied for her to have portable tanks.  O2 2L/ Advanced sleep and portable Daughter had esophageal cancer and just died from brain metastasis. Husband is BKA in wheelchair. She is having trouble coping. Patient is finding her current portable oxygen too heavy Reports little cough or wheeze.  04/15/14- 73 yoF followed for COPD, sleep apnea/ failed CPAP, complicated by pulmonary hypertension, CAD/ Hx MI. PCP Dr [name], Card- Dr [name] FOLLOWS FOR: Pt c/o runny nose, SOB with exertion, sinus congestion, PND, cough due to the PND. O2 2L/ Advanced  10/14/14- 74 yoF followed for COPD, sleep apnea/ failed CPAP, complicated by pulmonary hypertension, CAD/ Hx MI. PCP Dr [name], Card- Dr [name] FOR: Pt states she has SOB with activity; pt also states she has noticed her Right hip protruding out-unsure if this is causing her to walk different which is causing her breathing issues. O2 2L/ [employer] for sleep only, but sometimes wears it to rest in the daytime. Watery rhinorrhea now in early spring weather.   Review of Systems- see HPI Constitutional:   + weight loss, night sweats, no-Fevers, chills,No- fatigue,  lassitude. HEENT:   + headaches,  No-Difficulty swallowing,  Tooth/dental problems, Sore throat,                No-sneezing, itching, ear ache, nasal congestion, CV:  No chest pain, orthopnea, PND, +swelling in lower extremities, no-anasarca, dizziness, palpitations GI  No heartburn, indigestion, abdominal pain, nausea, vomiting, ,  Resp:   No excess mucus, no productive cough,  + non-productive cough,  No coughing up of blood.    No change in color of mucus.  No wheezing.   Skin: no  rash or lesions. GU: . MS:  No joint pain or swelling.   Psych:  No change in mood or affect. No depression +anxiety.  No memory loss.  Objective:   Physical Exam  General- +Alert, Oriented, Affect-appropriate, Distress- none acute   Obese.      On O2 2L sat 91% at rest   Wheelchair. Skin- rash-none, lesions- none, excoriation- none Lymphadenopathy- none Head- atraumatic            Eyes- Gross vision intact, PERRLA, conjunctivae clear secretions            Ears- Hearing, canals normal            Nose- Clear, No-septal dev, mucus, polyps, erosion, perforation             Throat- Mallampati III , mucosa clear/ dry , drainage- none, tonsils- atrophic. Large                                             tongue. Neck- flexible , trachea midline, no stridor , thyroid nl, carotid no bruit Chest - symmetrical excursion , unlabored           Heart/CV-  RR , no murmur heard today , no gallop  , no rub, nl s1 s2                           - JVD- none , edema-none, stasis changes- none, varices- none.           Lung- +distant. Clear to P&A, wheeze- none , dullness-none, rub- none           Chest wall-  Abd-        + lumbar scoliosis juts right hip Br/ Gen/ Rectal- Not done, not indicated Extrem- cyanosis- none, clubbing, none, atrophy- none, strength- nl Neuro- grossly intact to observation  Assessment & Plan:

## 2014-10-14 NOTE — Assessment & Plan Note (Signed)
She will continue oxygen 2 L for sleep. No recent acute exacerbation.

## 2014-10-14 NOTE — Addendum Note (Signed)
Addended by: Jetty Duhamel D on: 10/14/2014 09:10 PM   Modules accepted: Kipp Brood

## 2014-10-14 NOTE — Assessment & Plan Note (Signed)
She is not in overt heart failure on exam at this visit. Some dyspnea on exertion may be related to fluid status.

## 2014-10-21 NOTE — Progress Notes (Signed)
Quick Note:  Called and spoke to pt. Informed pt of the results per CY. Pt verbalized understanding and denied any further questions or concerns at this time. ______ 

## 2014-10-28 ENCOUNTER — Other Ambulatory Visit: Payer: Self-pay

## 2014-10-28 MED ORDER — METOPROLOL TARTRATE 50 MG PO TABS: 50.0000 mg | ORAL_TABLET | Freq: Two times a day (BID) | ORAL | Status: DC

## 2014-10-28 MED ORDER — RIVAROXABAN 20 MG PO TABS: 20.0000 mg | ORAL_TABLET | Freq: Every day | ORAL | Status: DC

## 2014-10-28 MED ORDER — SIMVASTATIN 20 MG PO TABS: 20.0000 mg | ORAL_TABLET | Freq: Every evening | ORAL | Status: DC

## 2014-10-28 MED ORDER — DIGOXIN 125 MCG PO TABS: 125.0000 ug | ORAL_TABLET | Freq: Every day | ORAL | Status: DC

## 2014-11-10 ENCOUNTER — Other Ambulatory Visit: Payer: Self-pay | Admitting: Cardiology

## 2014-12-15 ENCOUNTER — Encounter: Payer: Self-pay | Admitting: Cardiology

## 2014-12-29 ENCOUNTER — Telehealth: Payer: Self-pay

## 2014-12-29 DIAGNOSIS — E785 Hyperlipidemia, unspecified: Secondary | ICD-10-CM

## 2014-12-29 MED ORDER — SIMVASTATIN 40 MG PO TABS: 40.0000 mg | ORAL_TABLET | Freq: Every evening | ORAL | Status: DC

## 2014-12-29 NOTE — Telephone Encounter (Signed)
-----   Message from Quintella Reichert, MD sent at 12/16/2014 10:48 AM EDT ----- Increase simvastatin to 40mg  daily and recheck FLP and ALT in 6 weeks

## 2014-12-29 NOTE — Telephone Encounter (Signed)
Informed patient of results and verbal understanding expressed.   Instructed patient to INCREASE SIMVASTATIN to 40 mg daily. FLP and ALT ordered to be scheduled in 6 weeks. Patient wishes to schedule 6 week labs at [facility] next week.

## 2015-01-09 ENCOUNTER — Ambulatory Visit (INDEPENDENT_AMBULATORY_CARE_PROVIDER_SITE_OTHER): Payer: Medicare Other | Admitting: Cardiology

## 2015-01-09 ENCOUNTER — Encounter: Payer: Self-pay | Admitting: Cardiology

## 2015-01-09 VITALS — BP 154/80 | HR 80 | Ht 66.0 in | Wt 183.4 lb

## 2015-01-09 DIAGNOSIS — I10 Essential (primary) hypertension: Secondary | ICD-10-CM | POA: Diagnosis not present

## 2015-01-09 DIAGNOSIS — I2510 Atherosclerotic heart disease of native coronary artery without angina pectoris: Secondary | ICD-10-CM | POA: Diagnosis not present

## 2015-01-09 DIAGNOSIS — I270 Primary pulmonary hypertension: Secondary | ICD-10-CM

## 2015-01-09 DIAGNOSIS — I4892 Unspecified atrial flutter: Secondary | ICD-10-CM

## 2015-01-09 DIAGNOSIS — I5032 Chronic diastolic (congestive) heart failure: Secondary | ICD-10-CM

## 2015-01-09 DIAGNOSIS — R0602 Shortness of breath: Secondary | ICD-10-CM

## 2015-01-09 DIAGNOSIS — E785 Hyperlipidemia, unspecified: Secondary | ICD-10-CM

## 2015-01-09 DIAGNOSIS — I272 Pulmonary hypertension, unspecified: Secondary | ICD-10-CM

## 2015-01-09 LAB — BASIC METABOLIC PANEL
BUN: 13 mg/dL (ref 6–23)
CHLORIDE: 96 meq/L (ref 96–112)
CO2: 36 meq/L — AB (ref 19–32)
Calcium: 9.6 mg/dL (ref 8.4–10.5)
Creatinine, Ser: 0.91 mg/dL (ref 0.40–1.20)
GFR: 64.01 mL/min (ref >60.00)
Glucose, Bld: 113 mg/dL — ABNORMAL HIGH (ref 70–99)
POTASSIUM: 4.3 meq/L (ref 3.5–5.1)
SODIUM: 137 meq/L (ref 135–145)

## 2015-01-09 LAB — BRAIN NATRIURETIC PEPTIDE: Pro B Natriuretic peptide (BNP): 245 pg/mL — ABNORMAL HIGH (ref 0.0–100.0)

## 2015-01-09 MED ORDER — FUROSEMIDE 80 MG PO TABS: ORAL_TABLET | ORAL | Status: DC

## 2015-01-09 MED ORDER — SIMVASTATIN 40 MG PO TABS: 40.0000 mg | ORAL_TABLET | Freq: Every evening | ORAL | Status: DC

## 2015-01-09 NOTE — Progress Notes (Addendum)
Cardiology Office Note   Date:  01/09/2015   ID:  [name], DOB [birth year], MRN [id number]  PCP:  Astrid Divine, MD    Chief Complaint  Patient presents with  . Shortness of Breath  . Coronary Artery Disease  . Hypertension      History of Present Illness: [name] is a 74 y.o. female with a history of chronic diastolic CHF, moderate pulmonary HTN, HTN, CAD s/p CABG and dyslipidemia who presents today for followup. She is doing well from a cardiac standpoint. She denies any chest pain, LE edema, dizziness, palpitations or syncope. She has chronic SOB due to COPD and is on chronic O2.  She says that her breathing is worse in the heat and humidity and with allergies.  She sleeps with oxygen at night and has no PND or orthopnea.      Past Medical History  Diagnosis Date  . COPD (chronic obstructive pulmonary disease)   . Pulmonary HTN     PASP 50-66mmHg by [id number]  . H/O: rheumatic fever   . History of rheumatic heart disease     X 3-LAST TIME WHEN PATIENT WAS 74 YRS OLD  . Sleep apnea     intolerant to CPAP  . Hypertension   . H/O right bundle branch block   . Bursitis, shoulder     bilateral  . GERD (gastroesophageal reflux disease)   . H/O hiatal hernia   . Anxiety   . Depression   . Varicose veins   . PUD (peptic ulcer disease)   . 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 DJD (degenerative joint disease)   . OA (osteoarthritis)   . Hyperlipidemia   . Urinary incontinence   . Pulmonary HTN   . Thyroid nodule   . CHF (congestive heart failure)     chronic diasotlic CHF    Past Surgical History  Procedure Laterality Date  . Bilateral salpingoophorectomy    . Vaginal delivery      x4  . Thyroidectomy, partial  2010    Dr Michaell Cowing  . Tubal ligation    . Total abdominal hysterectomy      1990  . Coronary artery bypass graft  06/21/2000    x4  . Cardiac catheterization  02/11/2008, 01/30/2004, 06/14/2000  . Bladder  tack       Current Outpatient Prescriptions  Medication Sig Dispense Refill  . digoxin (LANOXIN) 0.125 MG tablet Take 1 tablet (125 mcg total) by mouth daily. 90 tablet 2  . fexofenadine (ALLEGRA) 180 MG tablet Take 180 mg by mouth daily as needed for allergies.     . furosemide (LASIX) 80 MG tablet take 1 tablet by mouth once daily then 1 EXTRA ONLY IF NEEDED as directed 90 tablet 2  . gabapentin (NEURONTIN) 100 MG capsule Take 2 capsules by mouth 2 (two) times daily. 2 in am 2 in pm    . metoprolol (LOPRESSOR) 50 MG tablet Take 1 tablet (50 mg total) by mouth 2 (two) times daily. 180 tablet 2  . nitroGLYCERIN (NITROSTAT) 0.4 MG SL tablet Place 1 tablet (0.4 mg total) under the tongue every 5 (five) minutes as needed for chest pain. 25 tablet 3  . omeprazole (PRILOSEC OTC) 20 MG tablet Take 20 mg by mouth daily.    . potassium chloride SA (K-DUR,KLOR-CON) 20 MEQ tablet Take 2 tablets by mouth  (  ) every morning and 1 tablet by mouth ( )  nightly 270 tablet 1  . rivaroxaban (XARELTO) 20 MG TABS tablet Take 1 tablet (20 mg total) by mouth at bedtime. 90 tablet 2  . sertraline (ZOLOFT) 100 MG tablet Take 100 mg by mouth at bedtime.     . simvastatin (ZOCOR) 40 MG tablet Take 1 tablet (40 mg total) by mouth every evening. 90 tablet 2  . trimethoprim (TRIMPEX) 100 MG tablet Take 100 mg by mouth daily.     No current facility-administered medications for this visit.    Allergies:   Prednisone; Xopenex; Codeine; Lipitor; and Ventolin    Social History:  The patient  reports that she quit smoking about 7 years ago. Her smoking use included Cigarettes. She has a 47 pack-year smoking history. She has never used smokeless tobacco. She reports that she does not drink alcohol or use illicit drugs.   Family History:  The patient's family history includes Anemia in her mother; Emphysema in her father; Esophageal cancer in her daughter.    ROS:  Please see the history of present illness.    Otherwise, review of systems are positive for none.   All other systems are reviewed and negative.    PHYSICAL EXAM: VS:  BP 154/80 mmHg  Pulse 80  Ht 5\' 6"  (1.676 m)  Wt 183 lb 6.4 oz (83.19 kg)  BMI 29.62 kg/m2  SpO2 94% , BMI Body mass index is 29.62 kg/(m^2). GEN: Well nourished, well developed, in no acute distress HEENT: normal Neck: no JVD, carotid bruits, or masses Cardiac: RRR; no murmurs, rubs, or gallops,no edema  Respiratory:  clear to auscultation bilaterally, normal work of breathing GI: soft, nontender, nondistended, + BS MS: no deformity or atrophy Skin: warm and dry, no rash Neuro:  Strength and sensation are intact Psych: euthymic mood, full affect   EKG:  EKG is not ordered today.    Recent Labs: 07/08/2014: BUN 14; Creatinine, Ser 1.1; Potassium 3.1*; Sodium 138    Lipid Panel    Component Value Date/Time   CHOL 168 08/21/2013 0853   TRIG 141.0 08/21/2013 0853   HDL 55.40 08/21/2013 0853   CHOLHDL 3 08/21/2013 0853   VLDL 28.2 08/21/2013 0853   LDLCALC 84 08/21/2013 0853   LDLDIRECT 160.0 05/09/2013 0922      Wt Readings from Last 3 Encounters:  01/09/15 183 lb 6.4 oz (83.19 kg)  10/14/14 184 lb 6.4 oz (83.643 kg)  07/08/14 180 lb 6.4 oz (81.829 kg)     ASSESSMENT AND PLAN:  1. Dyslipidemia - simvastatin just increased due to LDL at 76 (goal<70) - she has FLP and ALT pending in a few weeks 2.  Chronic diastolic CHF - well compensated on exam but now with increased SOB that is most likely due to COPD. Her weight is stable.   - continue Lasix/potassium/BB - check BMET/BNP 3. HTN - borderline controlled - continue Metoprolol   4. CAD- no angina.  She is not on ASA due to Xarelto  5. Paroxysmal atrial flutter - maintaining NSR - Her CHADS VASC score is 4 (age > 46, female, HTN, CHF) continue Xarelto/metoprolol/digoxin       6.  Moderate pulmonary HTN - I will repeat an echo to reassess.       7.  COPD with some increase in SOB  she thinks due to allergies.      Current medicines are reviewed at length with the patient today.  The patient does  not have concerns regarding medicines.  The following changes have been made:  no change  Labs/ tests ordered today: See above Assessment and Plan  Orders Placed This Encounter  Procedures  . Basic Metabolic Panel (BMET)  . B Nat Peptide  . Echocardiogram     Disposition:   FU with me in 6 months  Signed, Quintella Reichert, MD  01/09/2015 11:06 AM    [facility] [address] [place], [place], [place]  [address] Phone: [phone]; Fax: [phone]

## 2015-01-09 NOTE — Patient Instructions (Addendum)
Medication Instructions:  Your physician recommends that you continue on your current medications as directed. Please refer to the Current Medication list given to you today.   Labwork: TODAY: BMET, BNP  Testing/Procedures: Your physician has requested that you have an echocardiogram. Echocardiography is a painless test that uses sound waves to create images of your heart. It provides your doctor with information about the size and shape of your heart and how well your heart's chambers and valves are working. This procedure takes approximately one hour. There are no restrictions for this procedure.  Follow-Up: Your physician wants you to follow-up in: 6 months with Dr. Turner. You will receive a reminder letter in the mail two months in advance. If you don't receive a letter, please call our office to schedule the follow-up appointment.   Any Other Special Instructions Will Be Listed Below (If Applicable). 

## 2015-01-10 NOTE — Addendum Note (Signed)
Addended by: Armanda Magic R on: 01/10/2015 05:19 PM   Modules accepted: Kipp Brood

## 2015-01-13 ENCOUNTER — Telehealth: Payer: Self-pay

## 2015-01-13 NOTE — Telephone Encounter (Signed)
-----   Message from Quintella Reichert, MD sent at 01/12/2015  3:55 PM EDT ----- Increase Lasix to 80mg  BID for 2 days then back to 80mg  daily and call to let us know if SOB improved

## 2015-01-13 NOTE — Telephone Encounter (Signed)
Informed patient of results and verbal understanding expressed.  Instructed patient to INCREASE LASIX to 80 mg BID for 2 days.  Patient scheduled for EKG on Thursday. She will give an update on SOB then. Patient agrees with treatment plan.

## 2015-01-15 ENCOUNTER — Other Ambulatory Visit: Payer: Self-pay

## 2015-01-15 ENCOUNTER — Ambulatory Visit (INDEPENDENT_AMBULATORY_CARE_PROVIDER_SITE_OTHER): Payer: Medicare Other

## 2015-01-15 ENCOUNTER — Ambulatory Visit (HOSPITAL_COMMUNITY): Payer: Medicare Other | Attending: Cardiovascular Disease

## 2015-01-15 ENCOUNTER — Telehealth: Payer: Self-pay

## 2015-01-15 VITALS — BP 142/68 | HR 65 | Wt 179.8 lb

## 2015-01-15 DIAGNOSIS — I272 Other secondary pulmonary hypertension: Secondary | ICD-10-CM | POA: Diagnosis present

## 2015-01-15 DIAGNOSIS — I517 Cardiomegaly: Secondary | ICD-10-CM | POA: Diagnosis not present

## 2015-01-15 DIAGNOSIS — I351 Nonrheumatic aortic (valve) insufficiency: Secondary | ICD-10-CM | POA: Insufficient documentation

## 2015-01-15 DIAGNOSIS — I270 Primary pulmonary hypertension: Secondary | ICD-10-CM | POA: Diagnosis not present

## 2015-01-15 DIAGNOSIS — I4892 Unspecified atrial flutter: Secondary | ICD-10-CM | POA: Diagnosis not present

## 2015-01-15 NOTE — Telephone Encounter (Signed)
Informed patient of results and verbal understanding expressed.  Referral placed for Advanced Heart Failure Clinic. Patient understands she will be called to schedule appointment.

## 2015-01-15 NOTE — Patient Instructions (Signed)
Medication Instructions:  Your physician recommends that you continue on your current medications as directed. Please refer to the Current Medication list given to you today.   Labwork: None  Testing/Procedures: None  Follow-Up: Your physician recommends that you schedule a follow-up appointment AS DIRECTED with Dr. Mayford Knife.  Any Other Special Instructions Will Be Listed Below (If Applicable).

## 2015-01-15 NOTE — Telephone Encounter (Signed)
-----   Message from Quintella Reichert, MD sent at 01/15/2015  1:51 PM EDT ----- Please see above

## 2015-01-15 NOTE — Progress Notes (Signed)
Patient in today for EKG and ECHO. EKG ordered to see if patient is back in fib/flutter. EKG obtained and shown to Dr. Tenny Craw (DOD) who st it is unchanged. No new orders at this time.   Patient increased lasix to BID two days last week and st she has had some improvement with her SOB, but still cannot tell if SOB is from allergies. She st her allergies are still very bad. She had obvious nasal drainage at visit.  Patient aware she will be called if Dr. Mayford Knife has further recommendations.

## 2015-02-09 ENCOUNTER — Ambulatory Visit (HOSPITAL_COMMUNITY)
Admission: RE | Admit: 2015-02-09 | Discharge: 2015-02-09 | Disposition: A | Discharge status: Home / Self Care | Payer: Medicare Other | Source: Ambulatory Visit | Attending: Internal Medicine | Admitting: Internal Medicine

## 2015-02-09 ENCOUNTER — Encounter (HOSPITAL_COMMUNITY): Payer: Self-pay

## 2015-02-09 VITALS — BP 130/62 | HR 68 | Ht 66.0 in | Wt 182.8 lb

## 2015-02-09 DIAGNOSIS — Z951 Presence of aortocoronary bypass graft: Secondary | ICD-10-CM | POA: Diagnosis not present

## 2015-02-09 DIAGNOSIS — G4733 Obstructive sleep apnea (adult) (pediatric): Secondary | ICD-10-CM | POA: Insufficient documentation

## 2015-02-09 DIAGNOSIS — J449 Chronic obstructive pulmonary disease, unspecified: Secondary | ICD-10-CM | POA: Insufficient documentation

## 2015-02-09 DIAGNOSIS — Z9981 Dependence on supplemental oxygen: Secondary | ICD-10-CM | POA: Diagnosis not present

## 2015-02-09 DIAGNOSIS — I5032 Chronic diastolic (congestive) heart failure: Secondary | ICD-10-CM | POA: Diagnosis not present

## 2015-02-09 DIAGNOSIS — K219 Gastro-esophageal reflux disease without esophagitis: Secondary | ICD-10-CM | POA: Diagnosis not present

## 2015-02-09 DIAGNOSIS — M1990 Unspecified osteoarthritis, unspecified site: Secondary | ICD-10-CM | POA: Insufficient documentation

## 2015-02-09 DIAGNOSIS — Z79899 Other long term (current) drug therapy: Secondary | ICD-10-CM | POA: Diagnosis not present

## 2015-02-09 DIAGNOSIS — I2510 Atherosclerotic heart disease of native coronary artery without angina pectoris: Secondary | ICD-10-CM | POA: Insufficient documentation

## 2015-02-09 DIAGNOSIS — Z7902 Long term (current) use of antithrombotics/antiplatelets: Secondary | ICD-10-CM | POA: Diagnosis not present

## 2015-02-09 DIAGNOSIS — I272 Other secondary pulmonary hypertension: Secondary | ICD-10-CM | POA: Insufficient documentation

## 2015-02-09 DIAGNOSIS — I050 Rheumatic mitral stenosis: Secondary | ICD-10-CM

## 2015-02-09 DIAGNOSIS — E785 Hyperlipidemia, unspecified: Secondary | ICD-10-CM | POA: Diagnosis not present

## 2015-02-09 DIAGNOSIS — I10 Essential (primary) hypertension: Secondary | ICD-10-CM | POA: Diagnosis not present

## 2015-02-09 DIAGNOSIS — Z87891 Personal history of nicotine dependence: Secondary | ICD-10-CM | POA: Diagnosis not present

## 2015-02-09 MED ORDER — POTASSIUM CHLORIDE CRYS ER 20 MEQ PO TBCR: 20.0000 meq | EXTENDED_RELEASE_TABLET | Freq: Two times a day (BID) | ORAL | Status: DC

## 2015-02-09 MED ORDER — SPIRONOLACTONE 25 MG PO TABS: 12.5000 mg | ORAL_TABLET | Freq: Every day | ORAL | Status: DC

## 2015-02-09 MED ORDER — FUROSEMIDE 80 MG PO TABS: 80.0000 mg | ORAL_TABLET | Freq: Every day | ORAL | Status: DC

## 2015-02-09 NOTE — Progress Notes (Signed)
Patient ID: [name], female   DOB: 08-Jan-1941, 74 y.o.   MRN: [id number] PCP:Dr Valentina Lucks  Primary Cardiologist: Dr Mayford Knife  Pulmonary: Dr Maple Hudson  HPI: [name] is a 74 y.o. female with a history of chronic diastolic CHF, moderate pulmonary HTN, HTN, CAD s/p CABG and dyslipidemia referred to the HF clinic by Dr Mayford Knife.   She has been followed by Dr Mayford Knife and recently had increased dyspnea reported. She was instructed to take extra lasix --> 80 mg twice a day for 2 days on June 23.   Overall feeling ok. Denies dyspnea at rest. SOB with exertion. SOB with ADLs.  Limited activity at home but this is her baseline.Ambulates with a walker. Chronic back pain. Wears 2 liters oxygen continuously. Sleeps on 2 pillows. Intolerant CPAP. On Saturday Weight at home 180-185 pounds. Lives at home with son and husband.   ECHO 12/2014 EF 55-60%  Peak PA pressure 68 mmhg Mild-Mod Mitral Stenosis   Labs: 01/09/2015 K 4.3 Creatinine 0.91 BNP   Social History: The patient  reports that she quit smoking about 7 years ago. Her smoking use included Cigarettes. She has a 47 pack-year smoking history. She has never used smokeless tobacco. She reports that she does not drink alcohol or use illicit drugs.   Family History: The patient's family history includes Anemia in her mother; Emphysema in her father; Esophageal cancer in her daughter.   ROS: All systems negative except as listed in HPI, PMH and Problem List.  SH:  History   Social History  . Marital Status: Married    Spouse Name: N/A  . Number of Children: N/A  . Years of Education: N/A   Occupational History  . Not on file.   Social History Main Topics  . Smoking status: Former Smoker -- 1.00 packs/day for 47 years    Types: Cigarettes    Quit date: 07/26/2007  . Smokeless tobacco: Never Used  . Alcohol Use: No  . Drug Use: No  . Sexual Activity: No   Other Topics Concern  . Not on file   Social History Narrative   Husband bilateral  left amputee for atheorsclerotic disease    FH:  Family History  Problem Relation Age of Onset  . Anemia Mother   . Emphysema Father   . Esophageal cancer Daughter     Past Medical History  Diagnosis Date  . COPD (chronic obstructive pulmonary disease)   . Pulmonary HTN     PASP 50-58mmHg by [id number]  . H/O: rheumatic fever   . History of rheumatic heart disease     X 3-LAST TIME WHEN PATIENT WAS 74 YRS OLD  . Sleep apnea     intolerant to CPAP  . Hypertension   . H/O right bundle branch block   . Bursitis, shoulder     bilateral  . GERD (gastroesophageal reflux disease)   . H/O hiatal hernia   . Anxiety   . Depression   . Varicose veins   . PUD (peptic ulcer disease)   . 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 DJD (degenerative joint disease)   . OA (osteoarthritis)   . Hyperlipidemia   . Urinary incontinence   . Pulmonary HTN   . Thyroid nodule   . CHF (congestive heart failure)     chronic diasotlic CHF    Current Outpatient Prescriptions  Medication Sig Dispense Refill  . digoxin (LANOXIN) 0.125 MG tablet Take 1 tablet (125 mcg  total) by mouth daily. 90 tablet 2  . fexofenadine (ALLEGRA) 180 MG tablet Take 180 mg by mouth daily as needed for allergies.     . furosemide (LASIX) 80 MG tablet take 1 tablet by mouth once daily then 1 EXTRA ONLY IF NEEDED as directed 90 tablet 2  . gabapentin (NEURONTIN) 100 MG capsule Take 2 capsules by mouth 2 (two) times daily. 2 in am 2 in pm    . metoprolol (LOPRESSOR) 50 MG tablet Take 1 tablet (50 mg total) by mouth 2 (two) times daily. 180 tablet 2  . nitroGLYCERIN (NITROSTAT) 0.4 MG SL tablet Place 1 tablet (0.4 mg total) under the tongue every 5 (five) minutes as needed for chest pain. 25 tablet 3  . omeprazole (PRILOSEC OTC) 20 MG tablet Take 20 mg by mouth daily.    . potassium chloride SA (K-DUR,KLOR-CON) 20 MEQ tablet Take 2 tablets by mouth  ( ) every morning and 1 tablet by mouth ( )  nightly 270 tablet  1  . rivaroxaban (XARELTO) 20 MG TABS tablet Take 1 tablet (20 mg total) by mouth at bedtime. 90 tablet 2  . sertraline (ZOLOFT) 100 MG tablet Take 100 mg by mouth at bedtime.     . simvastatin (ZOCOR) 40 MG tablet Take 1 tablet (40 mg total) by mouth every evening. 90 tablet 2  . trimethoprim (TRIMPEX) 100 MG tablet Take 100 mg by mouth daily.     No current facility-administered medications for this encounter.    Filed Vitals:   02/09/15 1215  BP: 130/62  Pulse: 68  Height: 5\' 6"  (1.676 m)  Weight: 182 lb 12.8 oz (82.918 kg)  SpO2: 98%    PHYSICAL EXAM:  General:  Elderly. Sitting in wheel chair. No resp difficulty HEENT: normal Neck: supple. JVP 5-6 . Carotids 2+ bilaterally; no bruits. No lymphadenopathy or thryomegaly appreciated. Cor: PMI normal. Regular rate & rhythm. No rubs, gallops or murmurs. Lungs: clear. On 2 liters [place] Abdomen: soft, nontender, nondistended. No hepatosplenomegaly. No bruits or masses. Good bowel sounds. Extremities: no cyanosis, clubbing, rash, edema Neuro: alert & orientedx3, cranial nerves grossly intact. Moves all 4 extremities w/o difficulty. Affect pleasant.      ASSESSMENT & PLAN: 1. Chronic Diastolic Heart Failure / Valvular Disease.  NYHA III. Volume status stable. Continue lasix 80 mg daily and extra 80 mg for weight 185 or greater. If she takes extra lasix add extra 20 meq potassium. Cut back potassium 20 meq twice a day.  Add  12.5 mg spiro daily.  Discussed limiting fluid intake to < 2 liters per day and low salt diet.  2. CAD S/P CABG  No evidence ischemia. Continue statin and BB. On Xarelto so no aspirin.  3. PAF- CHADS VASC score is 4 (age > 68, female, HTN, CHF) continue Xarelto/metoprolol/digoxin 4.. Moderate pulmonary HTN - Peak PA pressure 68 mmhg noted ECHO 12/2014 .  5. COPD - Followed by Dr Maple Hudson. On 2 liters [place].  6. H/O Rheumatic heart disease -  7. OSA- intolerant CPAP  Follow up in 2 months   CLEGG,AMY NP-C   1:23 PM  Patient seen and examined with Tonye Becket, NP. We discussed all aspects of the encounter. I agree with the assessment and plan as stated above.   She is improved with recent diuresis. I reviewed echo images personally. 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 Discussed need to watch fluid and salt intake closely. Will add spiro. We also reviewed  use of sliding-scale lasix regimen. We will continue to follow and she how she is doing. MV seems amenable to balloon valvuloplasty but stenosis not severe enough at this point. Can consider stopping digoxin as rate control doesn't seem to be much of an issue.   Bensimhon, Daniel,MD 12:29 AM

## 2015-02-09 NOTE — Patient Instructions (Signed)
Start Spironolactone 12.5 mg (1/2 tab) daily  Decrease Potassium to 20 meq Twice daily   If weight is 185 lb or greater take an extra 80 mg of Lasix along with extra 20 meq of Potassium  Labs in 10 days  Your physician recommends that you schedule a follow-up appointment in: 6 weeks

## 2015-02-10 ENCOUNTER — Telehealth: Payer: Self-pay

## 2015-02-10 DIAGNOSIS — I050 Rheumatic mitral stenosis: Secondary | ICD-10-CM | POA: Insufficient documentation

## 2015-02-10 NOTE — Telephone Encounter (Signed)
10 day supply of Xarelto 20 mg samples

## 2015-02-16 ENCOUNTER — Other Ambulatory Visit: Payer: Self-pay

## 2015-02-16 MED ORDER — RIVAROXABAN 20 MG PO TABS: 20.0000 mg | ORAL_TABLET | Freq: Every day | ORAL | Status: DC

## 2015-02-19 ENCOUNTER — Other Ambulatory Visit (INDEPENDENT_AMBULATORY_CARE_PROVIDER_SITE_OTHER): Payer: Medicare Other | Admitting: *Deleted

## 2015-02-19 DIAGNOSIS — I5032 Chronic diastolic (congestive) heart failure: Secondary | ICD-10-CM

## 2015-02-19 LAB — BASIC METABOLIC PANEL
BUN: 12 mg/dL (ref 6–23)
CO2: 34 meq/L — AB (ref 19–32)
Calcium: 9.5 mg/dL (ref 8.4–10.5)
Chloride: 98 mEq/L (ref 96–112)
Creatinine, Ser: 1.03 mg/dL (ref 0.40–1.20)
GFR: 55.46 mL/min — AB (ref >60.00)
Glucose, Bld: 120 mg/dL — ABNORMAL HIGH (ref 70–99)
Potassium: 4.1 mEq/L (ref 3.5–5.1)
Sodium: 140 mEq/L (ref 135–145)

## 2015-02-19 NOTE — Addendum Note (Signed)
Addended by: Tonita Phoenix on: 02/19/2015 12:54 PM   Modules accepted: Orders

## 2015-03-23 ENCOUNTER — Ambulatory Visit (HOSPITAL_COMMUNITY)
Admission: RE | Admit: 2015-03-23 | Discharge: 2015-03-23 | Disposition: A | Discharge status: Home / Self Care | Payer: Medicare Other | Source: Ambulatory Visit | Attending: Cardiology | Admitting: Cardiology

## 2015-03-23 VITALS — BP 106/58 | HR 75 | Wt 181.0 lb

## 2015-03-23 DIAGNOSIS — G4733 Obstructive sleep apnea (adult) (pediatric): Secondary | ICD-10-CM | POA: Insufficient documentation

## 2015-03-23 DIAGNOSIS — Z87891 Personal history of nicotine dependence: Secondary | ICD-10-CM | POA: Diagnosis not present

## 2015-03-23 DIAGNOSIS — I050 Rheumatic mitral stenosis: Secondary | ICD-10-CM | POA: Diagnosis not present

## 2015-03-23 DIAGNOSIS — I2510 Atherosclerotic heart disease of native coronary artery without angina pectoris: Secondary | ICD-10-CM | POA: Insufficient documentation

## 2015-03-23 DIAGNOSIS — Z79899 Other long term (current) drug therapy: Secondary | ICD-10-CM | POA: Diagnosis not present

## 2015-03-23 DIAGNOSIS — I4892 Unspecified atrial flutter: Secondary | ICD-10-CM

## 2015-03-23 DIAGNOSIS — I272 Other secondary pulmonary hypertension: Secondary | ICD-10-CM | POA: Insufficient documentation

## 2015-03-23 DIAGNOSIS — Z9981 Dependence on supplemental oxygen: Secondary | ICD-10-CM | POA: Diagnosis not present

## 2015-03-23 DIAGNOSIS — F329 Major depressive disorder, single episode, unspecified: Secondary | ICD-10-CM | POA: Diagnosis not present

## 2015-03-23 DIAGNOSIS — I10 Essential (primary) hypertension: Secondary | ICD-10-CM | POA: Insufficient documentation

## 2015-03-23 DIAGNOSIS — Z7902 Long term (current) use of antithrombotics/antiplatelets: Secondary | ICD-10-CM | POA: Diagnosis not present

## 2015-03-23 DIAGNOSIS — E785 Hyperlipidemia, unspecified: Secondary | ICD-10-CM | POA: Diagnosis not present

## 2015-03-23 DIAGNOSIS — I480 Paroxysmal atrial fibrillation: Secondary | ICD-10-CM | POA: Diagnosis not present

## 2015-03-23 DIAGNOSIS — F419 Anxiety disorder, unspecified: Secondary | ICD-10-CM | POA: Insufficient documentation

## 2015-03-23 DIAGNOSIS — Z951 Presence of aortocoronary bypass graft: Secondary | ICD-10-CM | POA: Insufficient documentation

## 2015-03-23 DIAGNOSIS — J449 Chronic obstructive pulmonary disease, unspecified: Secondary | ICD-10-CM | POA: Insufficient documentation

## 2015-03-23 DIAGNOSIS — I5032 Chronic diastolic (congestive) heart failure: Secondary | ICD-10-CM | POA: Insufficient documentation

## 2015-03-23 DIAGNOSIS — K219 Gastro-esophageal reflux disease without esophagitis: Secondary | ICD-10-CM | POA: Insufficient documentation

## 2015-03-23 NOTE — Progress Notes (Addendum)
Advanced Heart Failure Medication Review by a Pharmacist  Does the patient  feel that his/her medications are working for him/her?  yes  Has the patient been experiencing any side effects to the medications prescribed?  no  Does the patient measure his/her own blood pressure or blood glucose at home?  no   Does the patient have any problems obtaining medications due to transportation or finances?   no  Understanding of regimen: good Understanding of indications: good Potential of compliance: good    Pharmacist comments:  [name] is a pleasant 74 yo F presenting without a medication list but is able to verbalize each of her medications to me. She states that she has not required any additional lasix lately. She does state that she is now in the donut hole with her Medicare Rx coverage and Xarelto is costly for her. Unfortunately, the [employer] does not currently assist with Xarelto copays. I will look into manufacturer assistance though.   Tyler Deis. [name], PharmD, BCPS, CPP Clinical Pharmacist Pager: [phone] Phone: [phone] 03/23/2015 10:42 AM

## 2015-03-23 NOTE — Patient Instructions (Signed)
STOP Digoxin Your physician recommends that you schedule a follow-up appointment in: 6 months with an echo  Your physician has requested that you have an echocardiogram. Echocardiography is a painless test that uses sound waves to create images of your heart. It provides your doctor with information about the size and shape of your heart and how well your heart's chambers and valves are working. This procedure takes approximately one hour. There are no restrictions for this procedure.

## 2015-03-23 NOTE — Progress Notes (Signed)
Patient ID: [name], female   DOB: [birth year], 74 y.o.   MRN: [id number] PCP:Dr Valentina Lucks  Primary Cardiologist: Dr Mayford Knife  Pulmonary: Dr Maple Hudson  HPI: [name] is a 74 y.o. female with a history of chronic diastolic CHF, moderate pulmonary HTN, HTN, CAD s/p CABG and dyslipidemia referred to the HF clinic by Dr Mayford Knife.   Overall feeling ok. Last visit spiro added. Mild dyspnea walking to the clinic but she left oxygen in the car. Normally wears 2 liters oxygen daily. Weight at home 179-181 pounds, stable. Intolerant of CPAP.  Lives at home with son and husband. Taking all medications.  No chest pain, no lightheadedness.  She can walk around her house without dyspnea.  Generally not very active.   ECHO 12/2014 EF 55-60%, peak PA pressure 68 mmHg, mild to moderate MS.   Labs: 01/09/2015 K 4.3 Creatinine 0.91 BNP  Labs 02/19/2015 K 4.1 Creatinine 1.03   ECG: NSR, LAFB, RBBB  Social History: The patient  reports that she quit smoking about 7 years ago. Her smoking use included Cigarettes. She has a 47 pack-year smoking history. She has never used smokeless tobacco. She reports that she does not drink alcohol or use illicit drugs.   Family History: The patient's family history includes Anemia in her mother; Emphysema in her father; Esophageal cancer in her daughter.   ROS: All systems negative except as listed in HPI, PMH and Problem List.  SH:  Social History   Social History  . Marital Status: Married    Spouse Name: N/A  . Number of Children: N/A  . Years of Education: N/A   Occupational History  . Not on file.   Social History Main Topics  . Smoking status: Former Smoker -- 1.00 packs/day for 47 years    Types: Cigarettes    Quit date: 07/26/2007  . Smokeless tobacco: Never Used  . Alcohol Use: No  . Drug Use: No  . Sexual Activity: No   Other Topics Concern  . Not on file   Social History Narrative   Husband bilateral left amputee for atheorsclerotic disease     FH:  Family History  Problem Relation Age of Onset  . Anemia Mother   . Emphysema Father   . Esophageal cancer Daughter     Past Medical History  Diagnosis Date  . COPD (chronic obstructive pulmonary disease)   . Pulmonary HTN     PASP 50-50mmHg by [id number]  . H/O: rheumatic fever   . History of rheumatic heart disease     X 3-LAST TIME WHEN PATIENT WAS 74 YRS OLD  . Sleep apnea     intolerant to CPAP  . Hypertension   . H/O right bundle branch block   . Bursitis, shoulder     bilateral  . GERD (gastroesophageal reflux disease)   . H/O hiatal hernia   . Anxiety   . Depression   . Varicose veins   . PUD (peptic ulcer disease)   . 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 DJD (degenerative joint disease)   . OA (osteoarthritis)   . Hyperlipidemia   . Urinary incontinence   . Pulmonary HTN   . Thyroid nodule   . CHF (congestive heart failure)     chronic diasotlic CHF    Current Outpatient Prescriptions  Medication Sig Dispense Refill  . calcium-vitamin D (OSCAL WITH D) 500-200 MG-UNIT per tablet Take 2 tablets by mouth daily with breakfast.    .  digoxin (LANOXIN) 0.125 MG tablet Take 1 tablet (125 mcg total) by mouth daily. 90 tablet 2  . fexofenadine (ALLEGRA) 180 MG tablet Take 180 mg by mouth daily as needed for allergies.     . furosemide (LASIX) 80 MG tablet Take 1 tablet (80 mg total) by mouth daily. Take 1 extra tab for weight 185 lb or greater 90 tablet 2  . gabapentin (NEURONTIN) 100 MG capsule Take 2 capsules by mouth 2 (two) times daily. 2 in am 2 in pm    . metoprolol (LOPRESSOR) 50 MG tablet Take 1 tablet (50 mg total) by mouth 2 (two) times daily. 180 tablet 2  . omeprazole (PRILOSEC OTC) 20 MG tablet Take 20 mg by mouth daily.    . potassium chloride SA (K-DUR,KLOR-CON) 20 MEQ tablet Take 1 tablet (20 mEq total) by mouth 2 (two) times daily. Take extra tab when you take extra Lasix 270 tablet 1  . rivaroxaban (XARELTO) 20 MG TABS tablet Take 1  tablet (20 mg total) by mouth at bedtime. 90 tablet 2  . sertraline (ZOLOFT) 100 MG tablet Take 100 mg by mouth at bedtime.     . simvastatin (ZOCOR) 40 MG tablet Take 1 tablet (40 mg total) by mouth every evening. 90 tablet 2  . spironolactone (ALDACTONE) 25 MG tablet Take 0.5 tablets (12.5 mg total) by mouth daily. 15 tablet 3  . trimethoprim (TRIMPEX) 100 MG tablet Take 100 mg by mouth daily.    . nitroGLYCERIN (NITROSTAT) 0.4 MG SL tablet Place 1 tablet (0.4 mg total) under the tongue every 5 (five) minutes as needed for chest pain. (Patient not taking: Reported on 03/23/2015) 25 tablet 3   No current facility-administered medications for this encounter.    Filed Vitals:   03/23/15 1027  BP: 106/58  Pulse: 75  Weight: 181 lb (82.101 kg)  SpO2: 90%    PHYSICAL EXAM:  General:  Elderly. Sitting in wheel chair. No resp difficulty HEENT: normal Neck: supple. JVP 5-6 . Carotids 2+ bilaterally; no bruits. No lymphadenopathy or thryomegaly appreciated. Cor: PMI normal. Regular rate & rhythm. No rubs, gallops.  2/6 SEM RUSB.  Lungs: clear. On 2 liters [place] Abdomen: soft, nontender, nondistended. No hepatosplenomegaly. No bruits or masses. Good bowel sounds. Extremities: no cyanosis, clubbing, rash, edema Neuro: alert & orientedx3, cranial nerves grossly intact. Moves all 4 extremities w/o difficulty. Affect pleasant.  ASSESSMENT & PLAN: 1. Chronic Diastolic Heart Failure / Valvular Disease: 01/15/2015 ECHO 55-60% with mild to moderate mitral stenosis and moderate to severe pulmonary hypertension. NYHA II-III. Volume status stable.  - Continue lasix 80 mg daily and extra 80 mg for weight 185 or greater. If she takes extra lasix add extra 20 meq potassium. - Continue potassium 20 meq twice a day.  - Continue 12.5 mg spironolactone daily.  - Discussed limiting fluid intake to < 2 liters per day and low salt diet.  2. CAD S/PCABG: No chest pain. Continue statin and metoprolol. On Xarelto so no  aspirin.  3. Atrial fibrillation: Paroxysmal. CHADSVASC score is 4 (age > 66, female, HTN, CHF).   - Continue Xarelto/metoprolol.  - She can stop digoxin.  4. Moderate pulmonary HTN: Peak PA pressure 68 mmHg noted ECHO 12/2014.  I suspect this is pulmonary venous hypertension in the setting of diastolic CHF/mitral stenosis.  5. COPD: Followed by Dr Maple Hudson. On 2 liters [place].  6.  OSA: intolerant CPAP 8.  Mitral stenosis: Rheumatic mitral stenosis.  Mild to moderate on last  echo.  Will repeat echo in 6 months, will need to assess valve at that time for need for valvuloplasty versus surgery.   Follow up in 6  months   CLEGG,AMY NP-C  10:37 AM   Patient seen with NP, agree with the above note.  She is symptomatically stable: not very active but also does not have prominent CHF symptoms.  She is not volume overloaded on exam.  She is in NSR.  Continue current medical regimen.  She will need repeat echo in 6 months to reassess her mitral valve => she may end up needing balloon valvuloplasty versus mitral valve replacement.    Marca Ancona 03/24/2015

## 2015-04-16 ENCOUNTER — Ambulatory Visit (INDEPENDENT_AMBULATORY_CARE_PROVIDER_SITE_OTHER): Payer: Medicare Other | Admitting: Internal Medicine

## 2015-04-16 ENCOUNTER — Encounter: Payer: Self-pay | Admitting: Internal Medicine

## 2015-04-16 VITALS — BP 132/64 | HR 61 | Ht 66.0 in | Wt 181.0 lb

## 2015-04-16 DIAGNOSIS — J302 Other seasonal allergic rhinitis: Secondary | ICD-10-CM

## 2015-04-16 DIAGNOSIS — Z23 Encounter for immunization: Secondary | ICD-10-CM | POA: Diagnosis not present

## 2015-04-16 DIAGNOSIS — M4126 Other idiopathic scoliosis, lumbar region: Secondary | ICD-10-CM

## 2015-04-16 DIAGNOSIS — I272 Pulmonary hypertension, unspecified: Secondary | ICD-10-CM

## 2015-04-16 DIAGNOSIS — M419 Scoliosis, unspecified: Secondary | ICD-10-CM

## 2015-04-16 DIAGNOSIS — I5032 Chronic diastolic (congestive) heart failure: Secondary | ICD-10-CM

## 2015-04-16 DIAGNOSIS — I270 Primary pulmonary hypertension: Secondary | ICD-10-CM | POA: Diagnosis not present

## 2015-04-16 DIAGNOSIS — J432 Centrilobular emphysema: Secondary | ICD-10-CM

## 2015-04-16 MED ORDER — AZELASTINE-FLUTICASONE 137-50 MCG/ACT NA SUSP: 2.0000 | Freq: Every day | NASAL | Status: DC

## 2015-04-16 NOTE — Progress Notes (Signed)
Patient ID: [name], female    DOB: 05-12-1941, 74 y.o.   MRN: [id number]  HPI 11/26/10-SATURATION QUALIFICATIONS: Patient Saturations on Room Air while Ambulating = 87% Patient Saturations on 2 Liters of oxygen while Ambulating = 96%[name],CMA   11/26/10 HPI: 78 yoF followed for COPD, sleep apnea, complicated by pulmonary hypertension. Last here July 29, 2010. PFT reviewed then- severe COPD FEV1 0.88/ 38%. Pollen season causing watery nose.  She took advice from a tv ad and changed her O2 DME to a [employer], but now wants to change back to [facility]. She continues O2 mostly at night. Wearing heart monitor because of tachypalpitation/ Dr Mayford Knife.. Back pain/ DGD limits her acitivity as much as her dyspnea.  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   04/01/11-  70 yoF followed for COPD, sleep apnea, complicated by pulmonary hypertension. Heart monitor results ok- Dr Mayford Knife- for tachypalpitation. Back pain was slowing her down. She got first epidural steroid injection this week.  Dr Michaell Cowing was following for THYROID nodule with hx partial thyroidectomy.  Flu vax talk.  She stays in when hot outdoors, rather than bothering with her portable oxygen. Continues O2 2L [facility] for sleep. Feels she is breathing some better lately, not worse. Says CPAP broke her face out- failed to tolerate.   11/30/11-  70 yoF followed for COPD, sleep apnea, complicated by pulmonary hypertension. PCP Dr Maurice Small, Card- Dr Renelda Mom hospital visit. Hospitalized April 10-14 according to family although discharge summary indicates April 10 through April 18. 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 History of intolerance to CPAP. CODE STATUS during this hospitalization was DO NOT RESUSCITATE. She and her [name] indicate that  she is feeling much better. Now off prednisone, oxygen added.  She describes a new dry cough and is noted to be on an ACE inhibitor which we discussed as one possible cause for the cough. Home oxygen continuous and portable 2 L/[facility]. CXR- 11/04/11- images reviewed IMPRESSION:  Volume loss at the left lung base. Streaky opacity is favored to  reflect atelectasis but infection is difficult to exclude. No  pulmonary edema or pleural effusions.  Original Report Authenticated By: [name], M.D.  ECHO- EF 65-70%. Grade 2 diastolic dysfunction. Mild aortic valve regurgitation. Mild to moderate mitral stenosis with mild mitral regurgitation. Left atrium mildly dilated.  04/03/12-  70 yoF followed for COPD, sleep apnea/ failed CPAP, complicated by pulmonary hypertension. PCP Dr Maurice Small, Card- Dr Mayford Knife Breathing at baseline, using O2 2L/ [facility] for sleep. She notices that her upper eyelids get red without irritation of the globes. She has been having fall season related nasal congestion. Using no regular medication, just her oxygen. She failed to tolerate CPAP and just wears oxygen.  08/27/12- 71 yoF followed for COPD, sleep apnea/ failed CPAP, complicated by pulmonary hypertension. PCP Dr Maurice Small, Card- Dr Donneta Romberg FOR: still SOB at times; coughs when feels nose is running Blames cough on post nasal drip noted to "allergy". Benadryl over dries and makes head itch. Wearing oxygen 2 L/[facility] for sleep. Little cough. No chest pain, swelling, palpitation or blood.  10/26/12-  71 yoF followed for COPD, sleep apnea/ failed CPAP, complicated by pulmonary hypertension. PCP Dr Maurice Small, Card- Dr Mayford Knife   Family here. Follow up to discuss back surgery.  Reports breathing has worsened over  the past week.  Has SOB with very little activity, chest tightness with SOB, runny nose with clear drainage, and nonprod cough.   Gradually increasing dyspnea on exertion, limited as much by  weakness. Some cough, no wheeze, nose drips. No chest pain. Allegra helps rhinitis. Continuous O2 2L/min/ [facility].  Feet swell, left more than right-chronic, dependent. No calf pain and no acute change. No new heart problems/Dr. Turner/[facility].  12/04/12- 72 yoF followed for COPD, sleep apnea/ failed CPAP, complicated by pulmonary hypertension. PCP Dr Maurice Small, Card- Dr Mayford Knife     [name] here ACUTE VISIT: runny nose, sore throat, cough-gagging-non productive, chills. [name] has recently been sick.  Increased SOB as well. 1 week frontal headache, watery rhinorhea, scratchy throat, dry cough. Began w/ fever/ chill. O2 2L/ [facility] CXR 10/29/12 Findings: The heart is again enlarged in size. Postsurgical  changes are again seen. The lungs are well-aerated bilaterally  without focal confluent infiltrate. No sizable effusion is noted.  IMPRESSION:  No acute abnormalities seen.  Original Report Authenticated By: [name], M.D.  02/25/13- 72 yoF followed for COPD, sleep apnea/ failed CPAP, complicated by pulmonary hypertension, CAD/ Hx MI. PCP Dr Maurice Small, Card- Dr Mayford Knife     [name] here c/o feeling fatigued, dizziness,  and increased DOE x 2 wks.  No cough, wheezing, chest tightness, or chest pain noted at this time. Note med list includes Spiriva and [place]. O2 2L/ [facility] She thinks she has been getting too much Lasix, 80 mg daily. Dr. Mayford Knife is away and her office wants to send her to ER. Frequent bathroom trips, orthostatic. Not taking either Tudorza or Spiriva.no infection. Back surgery with no respiratory problem June.  08/29/13- 72 yoF followed for COPD, sleep apnea/ failed CPAP, complicated by pulmonary hypertension, CAD/ Hx MI. PCP Dr Maurice Small, Card- Dr Mayford Knife     [name] here Follows For: SOB worse on exertion - Diff carrying purse and managing oxygen tanks - Wears oxygen all the timewhen at home - Denies cough or wheezing Exercise here- room air walking 08/29/13- desat to  88%. Says her oxygen tank is too heavy and asks about portable concentrator.  10/10/13-73 yoF followed for COPD, sleep apnea/ failed CPAP, complicated by pulmonary hypertension, CAD/ Hx MI. PCP Dr Maurice Small, Card- Dr Mayford Knife     FOLLOWS FOR: SOB when walking; hard to carry the O2 tanks-insurance denied for her to have portable tanks.  O2 2L/ [facility] sleep and portable [name] had esophageal cancer and just died from brain metastasis. Husband is BKA in wheelchair. She is having trouble coping. Patient is finding her current portable oxygen too heavy Reports little cough or wheeze.  04/15/14- 73 yoF followed for COPD, sleep apnea/ failed CPAP, complicated by pulmonary hypertension, CAD/ Hx MI. PCP Dr Maurice Small, Card- Dr Mayford Knife FOLLOWS FOR: Pt c/o runny nose, SOB with exertion, sinus congestion, PND, cough due to the PND. O2 2L/ [facility]  10/14/14- 74 yoF followed for COPD, sleep apnea/ failed CPAP, complicated by pulmonary hypertension, CAD/ Hx MI. PCP Dr Maurice Small, Card- Dr Donneta Romberg FOR: Pt states she has SOB with activity; pt also states she has noticed her Right hip protruding out-unsure if this is causing her to walk different which is causing her breathing issues. O2 2L/ [employer] for sleep only, but sometimes wears it to rest in the daytime. Watery rhinorrhea now in early spring weather.   04/16/15- 27 yoF former smoker followed for COPD, sleep apnea/ failed CPAP, complicated by pulmonary hypertension,  CAD/ Hx MI. PCP Dr Maurice Small, Card- Dr Donneta Romberg FOR: pt c/o worsening DOE, runny nose, PND.  claritin does not help s/s.   O2 2 L/[employer] continuous and portable Notices dyspnea especially if she is carrying a load in her arms or lifting something over her head. Asks me to notice that right hip juts out since she fell during the summer. CXR 10/14/14-I reviewed with her IMPRESSION: 1. Prior CABG. 2. Cardiomegaly with mild pulmonary vascular prominence  and interstitial prominence suggesting mild congestive heart failure. Small left pleural effusion cannot be excluded. Electronically Signed  By: Maisie Fus Register  On: 10/14/2014 13:49  Review of Systems- see HPI Constitutional:   + weight loss, night sweats, no-Fevers, chills,No- fatigue, lassitude. HEENT:   + headaches,  No-Difficulty swallowing,  Tooth/dental problems, Sore throat,                No-sneezing, itching, ear ache, + nasal congestion, CV:  No chest pain, orthopnea, PND, +swelling in lower extremities, no-anasarca, dizziness, palpitations GI  No heartburn, indigestion, abdominal pain, nausea, vomiting, ,  Resp:   No excess mucus, no productive cough,  + non-productive cough,  No coughing up of blood.    No change in color of mucus.  No wheezing.   Skin: no rash or lesions. GU: . MS:  No joint pain or swelling.   Psych:  No change in mood or affect. No depression +anxiety.  No memory loss.  Objective:   Physical Exam  General- +Alert, Oriented, Affect-appropriate, Distress- none acute   Obese.      On O2 2L sat 91% at rest   Wheelchair. Skin- rash-none, lesions- none, excoriation- none Lymphadenopathy- none Head- atraumatic            Eyes- Gross vision intact, PERRLA, conjunctivae clear secretions            Ears- Hearing, canals normal            Nose- Clear, No-septal dev, mucus, polyps, erosion, perforation             Throat- Mallampati III , mucosa clear/ dry , drainage- none, tonsils- atrophic.                        Large tongue. Neck- flexible , trachea midline, no stridor , thyroid nl, carotid no bruit Chest - symmetrical excursion , unlabored           Heart/CV-  RR , no murmur heard today , no gallop  , no rub, nl s1 s2                           - JVD- none , edema-none, stasis changes- none, varices- none.           Lung- +distant. Clear to P&A, wheeze- none , dullness-none, rub- none           Chest wall-  Abd-        + lumbar scoliosis/ juts right  hip Br/ Gen/ Rectal- Not done, not indicated Extrem- cyanosis- none, clubbing, none, atrophy- none, strength- nl Neuro- grossly intact to observation  Assessment & Plan:

## 2015-04-16 NOTE — Patient Instructions (Signed)
Flu vax  Sample Dymista nasal spray    Try 1-2 puffs, once daily at bedtime  You can take an otc antihistamine like claritin, allegra, zyrtec as needed

## 2015-04-18 DIAGNOSIS — M419 Scoliosis, unspecified: Secondary | ICD-10-CM | POA: Insufficient documentation

## 2015-04-18 NOTE — Assessment & Plan Note (Signed)
Systole increase in rhinorrhea Plan-sample Dymista nasal spray for trial

## 2015-04-18 NOTE — Assessment & Plan Note (Signed)
We discussed her cardiac status as significant contributor to dyspnea on exertion since supplemental oxygen maintaining saturations.

## 2015-04-18 NOTE — Assessment & Plan Note (Signed)
Although she is not having a lot of lumbosacral pain, the way her right hip juts out makes lumbar scoliosis likely. She relates it to a fall some months ago. I asked her to review it with her primary physician, who may choose to order x-rays.

## 2015-04-18 NOTE — Assessment & Plan Note (Signed)
Continues oxygen dependence. Plan-flu vaccine

## 2015-04-18 NOTE — Assessment & Plan Note (Signed)
Now continuous oxygen 2 L/[facility]

## 2015-05-19 ENCOUNTER — Telehealth (HOSPITAL_COMMUNITY): Payer: Self-pay

## 2015-05-19 NOTE — Telephone Encounter (Signed)
Was a patient of Dr. Mayford Knife who was giving her Xarelto; her care has been transfered to Dr. Gala Romney. Can not afford the >$400 for RX and wants to know if there is something different she can take less expensive I told patient that I would give her samples and have the pharmacist see if there is a program that she qualifies for  Medication Samples have been provided to the patient.  Drug name: xarelto  Qty: 20 mg  LOT: 15MG [id number]  Exp.Date: 06-18  The patient has been instructed regarding the correct time, dose, and frequency of taking this medication, including desired effects and most common side effects.   [name] 2:45 PM 05/19/2015   Also routing to [place], pharmacist to see about getting her in a program to recieve Xarelto.

## 2015-06-04 ENCOUNTER — Other Ambulatory Visit: Payer: Self-pay | Admitting: Orthopedic Surgery

## 2015-06-04 DIAGNOSIS — M5416 Radiculopathy, lumbar region: Secondary | ICD-10-CM

## 2015-06-10 ENCOUNTER — Other Ambulatory Visit (HOSPITAL_COMMUNITY): Payer: Self-pay | Admitting: *Deleted

## 2015-06-10 MED ORDER — SPIRONOLACTONE 25 MG PO TABS: 12.5000 mg | ORAL_TABLET | Freq: Every day | ORAL | Status: DC

## 2015-06-24 ENCOUNTER — Ambulatory Visit
Admission: RE | Admit: 2015-06-24 | Discharge: 2015-06-24 | Disposition: A | Discharge status: Home / Self Care | Payer: Medicare Other | Source: Ambulatory Visit | Attending: Orthopedic Surgery | Admitting: Orthopedic Surgery

## 2015-06-24 DIAGNOSIS — M5416 Radiculopathy, lumbar region: Secondary | ICD-10-CM

## 2015-07-05 ENCOUNTER — Other Ambulatory Visit: Payer: Self-pay | Admitting: Cardiology

## 2015-07-29 ENCOUNTER — Telehealth: Payer: Self-pay | Admitting: Cardiology

## 2015-07-29 NOTE — Telephone Encounter (Signed)
Let me see her 6 months after seen in CHF clinic

## 2015-07-29 NOTE — Telephone Encounter (Signed)
Informed patient she will get a call in about 6 months to schedule FU visit with Dr. Mayford Knife. Recall placed. Patient agrees with treatment plan and is grateful for call.

## 2015-07-29 NOTE — Telephone Encounter (Signed)
New Message  Pt calling to speak w/ RN- pt wanted to know if she needs to f/u w/ Dr Mayford Knife since she is to see CHF in Feb. Please call back and discuss.

## 2015-08-03 NOTE — Telephone Encounter (Signed)
Spoke with [facility] who states copay for Xarelto should be no more than $45/mo. Patient states she is now receiving through [facility] mail order and has not had any issues yet. She will call if anything changes.   Tyler Deis. [name], PharmD, BCPS, CPP Clinical Pharmacist Pager: [phone] Phone: [phone] 08/03/2015 3:05 PM

## 2015-08-08 ENCOUNTER — Other Ambulatory Visit: Payer: Self-pay | Admitting: Cardiology

## 2015-09-14 ENCOUNTER — Ambulatory Visit (HOSPITAL_BASED_OUTPATIENT_CLINIC_OR_DEPARTMENT_OTHER)
Admission: RE | Admit: 2015-09-14 | Discharge: 2015-09-14 | Disposition: A | Discharge status: Home / Self Care | Payer: Medicare Other | Source: Ambulatory Visit | Attending: Cardiology | Admitting: Cardiology

## 2015-09-14 ENCOUNTER — Encounter (HOSPITAL_COMMUNITY): Payer: Self-pay | Admitting: *Deleted

## 2015-09-14 ENCOUNTER — Ambulatory Visit (HOSPITAL_COMMUNITY)
Admission: RE | Admit: 2015-09-14 | Discharge: 2015-09-14 | Disposition: A | Discharge status: Home / Self Care | Payer: Medicare Other | Source: Ambulatory Visit | Attending: Internal Medicine | Admitting: Internal Medicine

## 2015-09-14 ENCOUNTER — Encounter (HOSPITAL_COMMUNITY): Payer: Self-pay

## 2015-09-14 VITALS — BP 114/70 | HR 82 | Resp 22 | Wt 186.2 lb

## 2015-09-14 DIAGNOSIS — I050 Rheumatic mitral stenosis: Secondary | ICD-10-CM | POA: Diagnosis not present

## 2015-09-14 DIAGNOSIS — I110 Hypertensive heart disease with heart failure: Secondary | ICD-10-CM | POA: Insufficient documentation

## 2015-09-14 DIAGNOSIS — Z87891 Personal history of nicotine dependence: Secondary | ICD-10-CM | POA: Insufficient documentation

## 2015-09-14 DIAGNOSIS — K219 Gastro-esophageal reflux disease without esophagitis: Secondary | ICD-10-CM | POA: Diagnosis not present

## 2015-09-14 DIAGNOSIS — I272 Other secondary pulmonary hypertension: Secondary | ICD-10-CM | POA: Insufficient documentation

## 2015-09-14 DIAGNOSIS — I2510 Atherosclerotic heart disease of native coronary artery without angina pectoris: Secondary | ICD-10-CM

## 2015-09-14 DIAGNOSIS — E785 Hyperlipidemia, unspecified: Secondary | ICD-10-CM | POA: Diagnosis not present

## 2015-09-14 DIAGNOSIS — I5032 Chronic diastolic (congestive) heart failure: Secondary | ICD-10-CM | POA: Diagnosis present

## 2015-09-14 DIAGNOSIS — Z7902 Long term (current) use of antithrombotics/antiplatelets: Secondary | ICD-10-CM | POA: Diagnosis not present

## 2015-09-14 DIAGNOSIS — Z9981 Dependence on supplemental oxygen: Secondary | ICD-10-CM | POA: Insufficient documentation

## 2015-09-14 DIAGNOSIS — F419 Anxiety disorder, unspecified: Secondary | ICD-10-CM | POA: Insufficient documentation

## 2015-09-14 DIAGNOSIS — Z951 Presence of aortocoronary bypass graft: Secondary | ICD-10-CM | POA: Diagnosis not present

## 2015-09-14 DIAGNOSIS — Z825 Family history of asthma and other chronic lower respiratory diseases: Secondary | ICD-10-CM | POA: Diagnosis not present

## 2015-09-14 DIAGNOSIS — M1990 Unspecified osteoarthritis, unspecified site: Secondary | ICD-10-CM | POA: Insufficient documentation

## 2015-09-14 DIAGNOSIS — I480 Paroxysmal atrial fibrillation: Secondary | ICD-10-CM | POA: Insufficient documentation

## 2015-09-14 DIAGNOSIS — F329 Major depressive disorder, single episode, unspecified: Secondary | ICD-10-CM | POA: Diagnosis not present

## 2015-09-14 DIAGNOSIS — Z79899 Other long term (current) drug therapy: Secondary | ICD-10-CM | POA: Insufficient documentation

## 2015-09-14 DIAGNOSIS — J449 Chronic obstructive pulmonary disease, unspecified: Secondary | ICD-10-CM | POA: Diagnosis not present

## 2015-09-14 DIAGNOSIS — G4733 Obstructive sleep apnea (adult) (pediatric): Secondary | ICD-10-CM | POA: Insufficient documentation

## 2015-09-14 LAB — BASIC METABOLIC PANEL
Anion gap: 14 (ref 5–15)
BUN: 12 mg/dL (ref 6–20)
CALCIUM: 9.3 mg/dL (ref 8.9–10.3)
CO2: 32 mmol/L (ref 22–32)
CREATININE: 0.69 mg/dL (ref 0.44–1.00)
Chloride: 95 mmol/L — ABNORMAL LOW (ref 101–111)
GFR calc non Af Amer: >60 mL/min (ref >60)
Glucose, Bld: 96 mg/dL (ref 65–99)
Potassium: 4.1 mmol/L (ref 3.5–5.1)
SODIUM: 141 mmol/L (ref 135–145)

## 2015-09-14 LAB — BRAIN NATRIURETIC PEPTIDE: B NATRIURETIC PEPTIDE 5: 208.5 pg/mL — AB (ref 0.0–100.0)

## 2015-09-14 LAB — LIPID PANEL
Cholesterol: 166 mg/dL (ref 0–200)
HDL: 67 mg/dL (ref >40)
LDL CALC: 82 mg/dL (ref 0–99)
TRIGLYCERIDES: 84 mg/dL (ref <150)
Total CHOL/HDL Ratio: 2.5 RATIO
VLDL: 17 mg/dL (ref 0–40)

## 2015-09-14 LAB — CBC
HCT: 36.2 % (ref 36.0–46.0)
Hemoglobin: 10.8 g/dL — ABNORMAL LOW (ref 12.0–15.0)
MCH: 25.8 pg — ABNORMAL LOW (ref 26.0–34.0)
MCHC: 29.8 g/dL — AB (ref 30.0–36.0)
MCV: 86.4 fL (ref 78.0–100.0)
PLATELETS: 217 10*3/uL (ref 150–400)
RBC: 4.19 MIL/uL (ref 3.87–5.11)
RDW: 14.4 % (ref 11.5–15.5)
WBC: 6.2 10*3/uL (ref 4.0–10.5)

## 2015-09-14 NOTE — Progress Notes (Addendum)
Advanced Heart Failure Medication Review by a Pharmacist  Does the patient  feel that his/her medications are working for him/her?  yes  Has the patient been experiencing any side effects to the medications prescribed?  Has some occurrences of epistaxis that resolves within several minutes.  Does the patient measure his/her own blood pressure or blood glucose at home?  No DM, BP remains stable at home.   Does the patient have any problems obtaining medications due to transportation or finances?   no  Understanding of regimen: excellent Understanding of indications: excellent Potential of compliance: excellent Patient understands to avoid NSAIDs. Patient understands to avoid decongestants.  Issues to address at subsequent visits:  F/u rivaroxaban cost with patient on next visit. Paid initial $295 deductible for 90 day supply at the beginning of the year and was concerned regarding cost of medication. Pt receiving rivaroxaban through [facility] mail order.   Pharmacist comments:  Ms. Suppa is a very pleasant 75 year old woman who presented to clinic with her granddaughter. She is very familiar with her medications and endorses good medication recall. She is not currently taking her Dymista, no longer on trimethoprim after she stopped seeing her nephrologist, and only on omeprazole prn. All other medications are taken as changed on her last visit to HF clinic. Ms. Larkey denies any dizziness or fatigue with current medication regimen. No bleeding concerns with Xarelto. She does note several episodes of epistaxis, which resolves within several minutes.  Of note, she voiced confusion with regards to having multiple specialists and wondered why she couldn't get all of her medical care from Dr. Mayford Knife. It was explained to Ms. Guerino that she was referred to another specialist to better manage all of her medical conditions.     Time with patient: 10 min Preparation and documentation time: 15  min Total time: 20 min   Hillery Aldo, Chandler.D., BCPS PGY2 Cardiology Pharmacy Resident Pager: [phone]

## 2015-09-14 NOTE — Patient Instructions (Addendum)
Labs today  Your physician has recommended that you have a pulmonary function test. Pulmonary Function Tests are a group of tests that measure how well air moves in and out of your lungs.  Your physician has requested that you have a TEE. During a TEE, sound waves are used to create images of your heart. It provides your doctor with information about the size and shape of your heart and how well your heart's chambers and valves are working. In this test, a transducer is attached to the end of a flexible tube that's guided down your throat and into your esophagus (the tube leading from you mouth to your stomach) to get a more detailed image of your heart. You are not awake for the procedure. Please see the instruction sheet given to you today. For further information please visit [email or web address]  Heart Catheterization 3/2, see instruction sheet  Your physician recommends that you schedule a follow-up appointment in: 3 WEEKS

## 2015-09-14 NOTE — Progress Notes (Signed)
  Echocardiogram 2D Echocardiogram has been performed.  [name] 09/14/2015, 2:10 PM

## 2015-09-15 ENCOUNTER — Other Ambulatory Visit (HOSPITAL_COMMUNITY): Payer: Self-pay | Admitting: *Deleted

## 2015-09-15 DIAGNOSIS — I050 Rheumatic mitral stenosis: Secondary | ICD-10-CM

## 2015-09-15 NOTE — Progress Notes (Signed)
Patient ID: [name], female   DOB: 07-18-1941, 75 y.o.   MRN: [id number] PCP: Dr Valentina Lucks  Primary Cardiologist: Dr Mayford Knife  Pulmonary: Dr Maple Hudson HF Cardiology: Dr. Shirlee Latch  HPI: [name] is a 75 y.o. female with a history of chronic diastolic CHF, moderate pulmonary HTN, HTN, CAD s/p CABG, COPD on home oxygen, and dyslipidemia referred to the HF clinic by Dr Mayford Knife.   Since last appointment, weight is up 5 lbs.  She has developed progressive dyspnea.  She is mildly short of breath walking around her house.  She is short of breath with any housework.  Uses a walker most of the time.  She cannot get out and go anywhere along now.  Occasional chest discomfort when she lies down in bed at night, but no exertional chest pain. She sleeps on 2 pillows chronically.     - ECHO 12/2014 EF 55-60%, peak PA pressure 68 mmHg, mild to moderate MS. - ECHO 2/17 EF 60-65%, mild LVH, mildly dilated RV with mildly decreased systolic function, PASP 29 mmHg (not sure we got a full envelope), moderate mitral stenosis with mean gradient 6 mmHg and MVA 1.37 cm^2, mild MR, mild AI, aortic sclerosis without significant stenosis.    Labs 01/09/2015 K 4.3 Creatinine 0.91 BNP  Labs 02/19/2015 K 4.1 Creatinine 1.03   ECG: NSR, PACs, LAFB, RBBB  Social History: The patient  reports that she quit smoking about 7 years ago. Her smoking use included Cigarettes. She has a 47 pack-year smoking history. She has never used smokeless tobacco. She reports that she does not drink alcohol or use illicit drugs.   Family History: The patient's family history includes Anemia in her mother; Emphysema in her father; Esophageal cancer in her daughter.   ROS: All systems negative except as listed in HPI, PMH and Problem List.  SH:  Social History   Social History  . Marital Status: Married    Spouse Name: N/A  . Number of Children: N/A  . Years of Education: N/A   Occupational History  . Not on file.   Social History Main  Topics  . Smoking status: Former Smoker -- 1.00 packs/day for 47 years    Types: Cigarettes    Quit date: 07/26/2007  . Smokeless tobacco: Never Used  . Alcohol Use: No  . Drug Use: No  . Sexual Activity: No   Other Topics Concern  . Not on file   Social History Narrative   Husband bilateral left amputee for atheorsclerotic disease    FH:  Family History  Problem Relation Age of Onset  . Anemia Mother   . Emphysema Father   . Esophageal cancer Daughter     Past Medical History  Diagnosis Date  . COPD (chronic obstructive pulmonary disease) (HCC)   . Pulmonary HTN (HCC)     PASP 50-32mmHg by [id number]  . H/O: rheumatic fever   . History of rheumatic heart disease     X 3-LAST TIME WHEN PATIENT WAS 75 YRS OLD  . Sleep apnea     intolerant to CPAP  . Hypertension   . H/O right bundle branch block   . Bursitis, shoulder     bilateral  . GERD (gastroesophageal reflux disease)   . H/O hiatal hernia   . Anxiety   . Depression   . Varicose veins   . PUD (peptic ulcer disease)   . 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 DJD (degenerative joint  disease)   . OA (osteoarthritis)   . Hyperlipidemia   . Urinary incontinence   . Pulmonary HTN (HCC)   . Thyroid nodule   . CHF (congestive heart failure) (HCC)     chronic diasotlic CHF    Current Outpatient Prescriptions  Medication Sig Dispense Refill  . calcium-vitamin D (OSCAL WITH D) 500-200 MG-UNIT per tablet Take 2 tablets by mouth daily with breakfast.    . furosemide (LASIX) 80 MG tablet Take 1 tablet (80 mg total) by mouth daily. Take 1 extra tab for weight 185 lb or greater 90 tablet 2  . gabapentin (NEURONTIN) 100 MG capsule Take 2 capsules by mouth 2 (two) times daily. 2 in am 2 in pm    . loratadine (CLARITIN) 10 MG tablet Take 10 mg by mouth daily.    . metoprolol (LOPRESSOR) 50 MG tablet Take 1 tablet by mouth two  times daily 180 tablet 0  . omeprazole (PRILOSEC OTC) 20 MG tablet Take 20 mg by mouth  daily as needed (GERD).     Marland Kitchen potassium chloride SA (K-DUR,KLOR-CON) 20 MEQ tablet Take 20 mEq by mouth 2 (two) times daily.    . rivaroxaban (XARELTO) 20 MG TABS tablet Take 1 tablet (20 mg total) by mouth at bedtime. 90 tablet 2  . sertraline (ZOLOFT) 100 MG tablet Take 100 mg by mouth at bedtime.     . simvastatin (ZOCOR) 40 MG tablet Take 1 tablet by mouth  every evening 90 tablet 0  . spironolactone (ALDACTONE) 25 MG tablet Take 0.5 tablets (12.5 mg total) by mouth daily. 15 tablet 3  . XARELTO 20 MG TABS tablet Take 1 tablet by mouth at  bedtime 90 tablet 0  . nitroGLYCERIN (NITROSTAT) 0.4 MG SL tablet Place 1 tablet (0.4 mg total) under the tongue every 5 (five) minutes as needed for chest pain. 25 tablet 3   No current facility-administered medications for this encounter.    Filed Vitals:   09/14/15 1411  BP: 114/70  Pulse: 82  Resp: 22  Weight: 186 lb 4 oz (84.482 kg)  SpO2: 96%    PHYSICAL EXAM:  General:  NAD, wearing oxygen. HEENT: normal Neck: supple. JVP 7-8. Carotids 2+ bilaterally; no bruits. No lymphadenopathy or thryomegaly appreciated. Cor: PMI normal. Regular rate & rhythm. No rubs, gallops.  2/6 SEM RUSB.  Lungs: clear. On 2 liters [place] Abdomen: soft, nontender, nondistended. No hepatosplenomegaly. No bruits or masses. Good bowel sounds. Extremities: no cyanosis, clubbing, rash.  Trace ankle edema.  Neuro: alert & orientedx3, cranial nerves grossly intact. Moves all 4 extremities w/o difficulty. Affect pleasant.  ASSESSMENT & PLAN: 1. Chronic diastolic CHF/valvular heart disease: Echo 2/17 reviewed.  EF 60-65% with moderate mitral stenosis. NYHA IIIb symptoms.  She does not appear significantly volume overloaded on exam.  It is difficult to determine how much of her symptomatology is due to mitral stenosis and how much is due to COPD.  - Continue lasix 80 mg daily and KCl 40 qam/20 qpm.  She will need RHC to ascertain volume status, see below.   - Continue 12.5  mg spironolactone daily.  - BMET/BNP today.  - Discussed limiting fluid intake to < 2 liters per day and low salt diet.  2. CAD S/P CABG: No worrisome chest pain. Continue statin and metoprolol. On Xarelto so no aspirin. Check lipids today.  3. Atrial fibrillation: Paroxysmal. CHADSVASC score is 4 (age > 2, female, HTN, CHF).   - Continue Xarelto/metoprolol. CBC today.  4. Moderate pulmonary HTN: Peak PA pressure 68 mmHg noted ECHO 12/2014, PASP only 29 mmHg on most recent echo 2/17 but suspect this is underestimated.  I suspect that she has pulmonary venous hypertension in the setting of diastolic CHF/mitral stenosis.  I am going to arrange for RHC to assess.  5. COPD: Followed by Dr Maple Hudson. On 2 liters [place]. I am going to arrange for PFTs to try to quantify severity of COPD.  6.  OSA: intolerant CPAP 8.  Mitral stenosis: Rheumatic mitral stenosis.  Moderate on today's echo.  It is difficult to tell how much of her symptoms are due to mitral stenosis/CHF and how much to COPD.  She has very prominent exertional dyspnea.  - I am going to arrange for TEE to assess the mitral valve more closely.  I do not think that she would be a surgical MV repair/replacement candidate give comorbidities.  She may, however, be a valvuloplasty candidate.  We discussed risks/benefits of this procedure and she agrees to proceed with it.  - I am going to arrange for LHC/RHC to fully assess mitral valve and also to measure filling pressures and PA pressure.  I will assess for progressive CAD as cause of symptoms as well.   We discussed risks/benefits of this procedure and she agrees to proceed with it.  She will need to hold Xarelto for 3 days prior to cardiac cath.   Marca Ancona 09/15/2015

## 2015-09-17 ENCOUNTER — Telehealth (HOSPITAL_COMMUNITY): Payer: Self-pay | Admitting: *Deleted

## 2015-09-17 NOTE — Telephone Encounter (Signed)
No pre ceert reqd for TEE and L&R heart cath on 3/2 per [facility] MEDICARE

## 2015-09-18 ENCOUNTER — Encounter (HOSPITAL_COMMUNITY): Payer: Self-pay | Admitting: *Deleted

## 2015-09-18 ENCOUNTER — Ambulatory Visit (HOSPITAL_COMMUNITY)
Admission: RE | Admit: 2015-09-18 | Discharge: 2015-09-18 | Disposition: A | Discharge status: Home / Self Care | Payer: Medicare Other | Source: Ambulatory Visit | Attending: Cardiology | Admitting: Cardiology

## 2015-09-18 DIAGNOSIS — I050 Rheumatic mitral stenosis: Secondary | ICD-10-CM | POA: Insufficient documentation

## 2015-09-18 LAB — PULMONARY FUNCTION TEST
DL/VA % pred: 157 %
DL/VA: 4.36 ml/min/mmHg/L
DLCO UNC % PRED: 96 %
DLCO UNC: 9.1 ml/min/mmHg
FEF 25-75 Pre: 0.41 L/sec
FEF2575-%Pred-Pre: 40 %
FEV1-%Pred-Pre: 56 %
FEV1-Pre: 0.63 L
FEV1FVC-%PRED-PRE: 95 %
FEV6-%Pred-Pre: 61 %
FEV6-Pre: 0.88 L
FEV6FVC-%Pred-Pre: 107 %
FVC-%Pred-Pre: 56 %
FVC-Pre: 0.88 L
PRE FEV1/FVC RATIO: 72 %
Pre FEV6/FVC Ratio: 100 %
RV % pred: 113 %
RV: 2 L
TLC % PRED: 90 %
TLC: 3.09 L

## 2015-09-24 ENCOUNTER — Inpatient Hospital Stay (HOSPITAL_COMMUNITY): Payer: Medicare Other

## 2015-09-24 ENCOUNTER — Encounter (HOSPITAL_COMMUNITY)
Admission: AD | Disposition: A | Discharge status: Home Health | Payer: Self-pay | Source: Ambulatory Visit | Attending: Cardiology

## 2015-09-24 ENCOUNTER — Encounter (HOSPITAL_COMMUNITY): Payer: Self-pay | Admitting: *Deleted

## 2015-09-24 ENCOUNTER — Ambulatory Visit (HOSPITAL_COMMUNITY): Payer: Medicare Other

## 2015-09-24 ENCOUNTER — Inpatient Hospital Stay (HOSPITAL_COMMUNITY)
Admission: AD | Admit: 2015-09-24 | Discharge: 2015-09-30 | DRG: 286 | Disposition: A | Discharge status: Home Health | Payer: Medicare Other | Source: Ambulatory Visit | Attending: Cardiology | Admitting: Cardiology

## 2015-09-24 DIAGNOSIS — Z79899 Other long term (current) drug therapy: Secondary | ICD-10-CM | POA: Diagnosis not present

## 2015-09-24 DIAGNOSIS — Z9981 Dependence on supplemental oxygen: Secondary | ICD-10-CM | POA: Diagnosis not present

## 2015-09-24 DIAGNOSIS — E785 Hyperlipidemia, unspecified: Secondary | ICD-10-CM | POA: Diagnosis present

## 2015-09-24 DIAGNOSIS — F419 Anxiety disorder, unspecified: Secondary | ICD-10-CM | POA: Diagnosis present

## 2015-09-24 DIAGNOSIS — I330 Acute and subacute infective endocarditis: Secondary | ICD-10-CM | POA: Diagnosis present

## 2015-09-24 DIAGNOSIS — I5031 Acute diastolic (congestive) heart failure: Secondary | ICD-10-CM | POA: Diagnosis present

## 2015-09-24 DIAGNOSIS — G4733 Obstructive sleep apnea (adult) (pediatric): Secondary | ICD-10-CM | POA: Diagnosis present

## 2015-09-24 DIAGNOSIS — I350 Nonrheumatic aortic (valve) stenosis: Secondary | ICD-10-CM

## 2015-09-24 DIAGNOSIS — Z880 Allergy status to penicillin: Secondary | ICD-10-CM | POA: Diagnosis not present

## 2015-09-24 DIAGNOSIS — Z951 Presence of aortocoronary bypass graft: Secondary | ICD-10-CM | POA: Diagnosis not present

## 2015-09-24 DIAGNOSIS — I4510 Unspecified right bundle-branch block: Secondary | ICD-10-CM | POA: Diagnosis present

## 2015-09-24 DIAGNOSIS — Z8711 Personal history of peptic ulcer disease: Secondary | ICD-10-CM | POA: Diagnosis not present

## 2015-09-24 DIAGNOSIS — M1990 Unspecified osteoarthritis, unspecified site: Secondary | ICD-10-CM | POA: Diagnosis present

## 2015-09-24 DIAGNOSIS — Z87891 Personal history of nicotine dependence: Secondary | ICD-10-CM | POA: Diagnosis not present

## 2015-09-24 DIAGNOSIS — E8770 Fluid overload, unspecified: Secondary | ICD-10-CM | POA: Diagnosis present

## 2015-09-24 DIAGNOSIS — I340 Nonrheumatic mitral (valve) insufficiency: Secondary | ICD-10-CM

## 2015-09-24 DIAGNOSIS — I272 Other secondary pulmonary hypertension: Secondary | ICD-10-CM | POA: Diagnosis present

## 2015-09-24 DIAGNOSIS — I050 Rheumatic mitral stenosis: Secondary | ICD-10-CM

## 2015-09-24 DIAGNOSIS — Z881 Allergy status to other antibiotic agents status: Secondary | ICD-10-CM | POA: Diagnosis not present

## 2015-09-24 DIAGNOSIS — I5033 Acute on chronic diastolic (congestive) heart failure: Secondary | ICD-10-CM | POA: Diagnosis present

## 2015-09-24 DIAGNOSIS — Z888 Allergy status to other drugs, medicaments and biological substances status: Secondary | ICD-10-CM

## 2015-09-24 DIAGNOSIS — I509 Heart failure, unspecified: Secondary | ICD-10-CM

## 2015-09-24 DIAGNOSIS — I2510 Atherosclerotic heart disease of native coronary artery without angina pectoris: Secondary | ICD-10-CM | POA: Diagnosis present

## 2015-09-24 DIAGNOSIS — I339 Acute and subacute endocarditis, unspecified: Secondary | ICD-10-CM | POA: Diagnosis not present

## 2015-09-24 DIAGNOSIS — E876 Hypokalemia: Secondary | ICD-10-CM | POA: Diagnosis present

## 2015-09-24 DIAGNOSIS — I358 Other nonrheumatic aortic valve disorders: Secondary | ICD-10-CM

## 2015-09-24 DIAGNOSIS — Z7901 Long term (current) use of anticoagulants: Secondary | ICD-10-CM

## 2015-09-24 DIAGNOSIS — J449 Chronic obstructive pulmonary disease, unspecified: Secondary | ICD-10-CM | POA: Diagnosis present

## 2015-09-24 DIAGNOSIS — I083 Combined rheumatic disorders of mitral, aortic and tricuspid valves: Secondary | ICD-10-CM | POA: Diagnosis present

## 2015-09-24 DIAGNOSIS — F329 Major depressive disorder, single episode, unspecified: Secondary | ICD-10-CM | POA: Diagnosis present

## 2015-09-24 DIAGNOSIS — B9689 Other specified bacterial agents as the cause of diseases classified elsewhere: Secondary | ICD-10-CM | POA: Diagnosis not present

## 2015-09-24 DIAGNOSIS — I480 Paroxysmal atrial fibrillation: Secondary | ICD-10-CM | POA: Diagnosis present

## 2015-09-24 DIAGNOSIS — R5381 Other malaise: Secondary | ICD-10-CM | POA: Diagnosis present

## 2015-09-24 DIAGNOSIS — I110 Hypertensive heart disease with heart failure: Principal | ICD-10-CM | POA: Diagnosis present

## 2015-09-24 DIAGNOSIS — Z91040 Latex allergy status: Secondary | ICD-10-CM | POA: Diagnosis not present

## 2015-09-24 DIAGNOSIS — R7881 Bacteremia: Secondary | ICD-10-CM | POA: Diagnosis not present

## 2015-09-24 DIAGNOSIS — K219 Gastro-esophageal reflux disease without esophagitis: Secondary | ICD-10-CM | POA: Diagnosis present

## 2015-09-24 HISTORY — DX: Dependence on supplemental oxygen: Z99.81

## 2015-09-24 HISTORY — PX: TEE WITHOUT CARDIOVERSION: SHX5443

## 2015-09-24 HISTORY — PX: CARDIAC CATHETERIZATION: SHX172

## 2015-09-24 HISTORY — DX: Other complications of anesthesia, initial encounter: T88.59XA

## 2015-09-24 HISTORY — DX: Adverse effect of unspecified anesthetic, initial encounter: T41.45XA

## 2015-09-24 LAB — POCT I-STAT 3, VENOUS BLOOD GAS (G3P V)
Acid-Base Excess: 10 mmol/L — ABNORMAL HIGH (ref 0.0–2.0)
Acid-Base Excess: 8 mmol/L — ABNORMAL HIGH (ref 0.0–2.0)
BICARBONATE: 36.3 meq/L — AB (ref 20.0–24.0)
Bicarbonate: 38.8 mEq/L — ABNORMAL HIGH (ref 20.0–24.0)
O2 Saturation: 57 %
O2 Saturation: 60 %
PCO2 VEN: 75.3 mmHg — AB (ref 45.0–50.0)
PH VEN: 7.292 (ref 7.250–7.300)
PO2 VEN: 36 mmHg (ref 30.0–45.0)
TCO2: 39 mmol/L (ref 0–100)
TCO2: 41 mmol/L (ref 0–100)
pCO2, Ven: 79.9 mmHg (ref 45.0–50.0)
pH, Ven: 7.294 (ref 7.250–7.300)
pO2, Ven: 35 mmHg (ref 30.0–45.0)

## 2015-09-24 LAB — CBC WITH DIFFERENTIAL/PLATELET
BASOS ABS: 0 10*3/uL (ref 0.0–0.1)
Basophils Relative: 0 %
Eosinophils Absolute: 0.2 10*3/uL (ref 0.0–0.7)
Eosinophils Relative: 3 %
HEMATOCRIT: 36 % (ref 36.0–46.0)
Hemoglobin: 10.2 g/dL — ABNORMAL LOW (ref 12.0–15.0)
LYMPHS ABS: 1.4 10*3/uL (ref 0.7–4.0)
LYMPHS PCT: 22 %
MCH: 24.6 pg — AB (ref 26.0–34.0)
MCHC: 28.3 g/dL — ABNORMAL LOW (ref 30.0–36.0)
MCV: 86.7 fL (ref 78.0–100.0)
MONO ABS: 0.5 10*3/uL (ref 0.1–1.0)
MONOS PCT: 9 %
NEUTROS ABS: 4.2 10*3/uL (ref 1.7–7.7)
Neutrophils Relative %: 66 %
Platelets: 216 10*3/uL (ref 150–400)
RBC: 4.15 MIL/uL (ref 3.87–5.11)
RDW: 14.7 % (ref 11.5–15.5)
WBC: 6.3 10*3/uL (ref 4.0–10.5)

## 2015-09-24 LAB — CBC
HCT: 34.9 % — ABNORMAL LOW (ref 36.0–46.0)
Hemoglobin: 10.3 g/dL — ABNORMAL LOW (ref 12.0–15.0)
MCH: 25.7 pg — AB (ref 26.0–34.0)
MCHC: 29.5 g/dL — AB (ref 30.0–36.0)
MCV: 87 fL (ref 78.0–100.0)
PLATELETS: 214 10*3/uL (ref 150–400)
RBC: 4.01 MIL/uL (ref 3.87–5.11)
RDW: 14.7 % (ref 11.5–15.5)
WBC: 6.8 10*3/uL (ref 4.0–10.5)

## 2015-09-24 LAB — COMPREHENSIVE METABOLIC PANEL
ALT: 15 U/L (ref 14–54)
AST: 21 U/L (ref 15–41)
Albumin: 3.4 g/dL — ABNORMAL LOW (ref 3.5–5.0)
Alkaline Phosphatase: 84 U/L (ref 38–126)
Anion gap: 11 (ref 5–15)
BILIRUBIN TOTAL: 0.5 mg/dL (ref 0.3–1.2)
BUN: 8 mg/dL (ref 6–20)
CO2: 34 mmol/L — ABNORMAL HIGH (ref 22–32)
CREATININE: 0.86 mg/dL (ref 0.44–1.00)
Calcium: 9.2 mg/dL (ref 8.9–10.3)
Chloride: 99 mmol/L — ABNORMAL LOW (ref 101–111)
GFR calc Af Amer: >60 mL/min (ref >60)
Glucose, Bld: 148 mg/dL — ABNORMAL HIGH (ref 65–99)
POTASSIUM: 3.5 mmol/L (ref 3.5–5.1)
Sodium: 144 mmol/L (ref 135–145)
TOTAL PROTEIN: 7.3 g/dL (ref 6.5–8.1)

## 2015-09-24 LAB — BRAIN NATRIURETIC PEPTIDE: B Natriuretic Peptide: 436.5 pg/mL — ABNORMAL HIGH (ref 0.0–100.0)

## 2015-09-24 LAB — MAGNESIUM: MAGNESIUM: 1.9 mg/dL (ref 1.7–2.4)

## 2015-09-24 SURGERY — ECHOCARDIOGRAM, TRANSESOPHAGEAL
Anesthesia: Moderate Sedation

## 2015-09-24 SURGERY — RIGHT HEART CATH

## 2015-09-24 MED ORDER — MIDAZOLAM HCL 5 MG/ML IJ SOLN
INTRAMUSCULAR | Status: AC
  Filled 2015-09-24: qty 2

## 2015-09-24 MED ORDER — LIDOCAINE HCL (PF) 1 % IJ SOLN
INTRAMUSCULAR | Status: AC
  Filled 2015-09-24: qty 30

## 2015-09-24 MED ORDER — NITROGLYCERIN 0.4 MG SL SUBL: 0.4000 mg | SUBLINGUAL_TABLET | SUBLINGUAL | Status: DC | PRN

## 2015-09-24 MED ORDER — SODIUM CHLORIDE 0.9% FLUSH
3.0000 mL | Freq: Two times a day (BID) | INTRAVENOUS | Status: DC
  Administered 2015-09-24 – 2015-09-29 (×12): 3 mL via INTRAVENOUS

## 2015-09-24 MED ORDER — SIMVASTATIN 40 MG PO TABS
40.0000 mg | ORAL_TABLET | Freq: Every day | ORAL | Status: DC
  Administered 2015-09-24 – 2015-09-29 (×6): 40 mg via ORAL
  Filled 2015-09-24 (×6): qty 1

## 2015-09-24 MED ORDER — LORATADINE 10 MG PO TABS
10.0000 mg | ORAL_TABLET | Freq: Every day | ORAL | Status: DC
  Administered 2015-09-24 – 2015-09-30 (×7): 10 mg via ORAL
  Filled 2015-09-24 (×7): qty 1

## 2015-09-24 MED ORDER — LIDOCAINE HCL (PF) 1 % IJ SOLN
INTRAMUSCULAR | Status: DC | PRN
  Administered 2015-09-24: 2 mL via INTRADERMAL

## 2015-09-24 MED ORDER — SODIUM CHLORIDE 0.9% FLUSH: 3.0000 mL | Freq: Two times a day (BID) | INTRAVENOUS | Status: DC

## 2015-09-24 MED ORDER — FUROSEMIDE 10 MG/ML IJ SOLN
INTRAMUSCULAR | Status: AC
  Filled 2015-09-24: qty 8

## 2015-09-24 MED ORDER — SODIUM CHLORIDE 0.9% FLUSH: 3.0000 mL | INTRAVENOUS | Status: DC | PRN

## 2015-09-24 MED ORDER — BUTAMBEN-TETRACAINE-BENZOCAINE 2-2-14 % EX AERO
INHALATION_SPRAY | CUTANEOUS | Status: DC | PRN
  Administered 2015-09-24: 2 via TOPICAL

## 2015-09-24 MED ORDER — METOPROLOL TARTRATE 50 MG PO TABS
50.0000 mg | ORAL_TABLET | Freq: Two times a day (BID) | ORAL | Status: DC
  Administered 2015-09-24 – 2015-09-30 (×12): 50 mg via ORAL
  Filled 2015-09-24 (×12): qty 1

## 2015-09-24 MED ORDER — PANTOPRAZOLE SODIUM 40 MG PO TBEC
40.0000 mg | DELAYED_RELEASE_TABLET | Freq: Every day | ORAL | Status: DC
  Administered 2015-09-24 – 2015-09-30 (×7): 40 mg via ORAL
  Filled 2015-09-24 (×7): qty 1

## 2015-09-24 MED ORDER — FENTANYL CITRATE (PF) 100 MCG/2ML IJ SOLN
INTRAMUSCULAR | Status: AC
  Filled 2015-09-24: qty 2

## 2015-09-24 MED ORDER — FUROSEMIDE 10 MG/ML IJ SOLN
80.0000 mg | Freq: Two times a day (BID) | INTRAMUSCULAR | Status: DC
  Administered 2015-09-24 – 2015-09-27 (×6): 80 mg via INTRAVENOUS
  Filled 2015-09-24 (×5): qty 8

## 2015-09-24 MED ORDER — HEPARIN (PORCINE) IN NACL 2-0.9 UNIT/ML-% IJ SOLN
INTRAMUSCULAR | Status: AC
Start: 2015-09-24 — End: 2015-09-24
  Filled 2015-09-24: qty 500

## 2015-09-24 MED ORDER — RIVAROXABAN 20 MG PO TABS
20.0000 mg | ORAL_TABLET | Freq: Every day | ORAL | Status: DC
  Administered 2015-09-24 – 2015-09-29 (×6): 20 mg via ORAL
  Filled 2015-09-24 (×6): qty 1

## 2015-09-24 MED ORDER — SODIUM CHLORIDE 0.9 % IV SOLN: 250.0000 mL | INTRAVENOUS | Status: DC | PRN

## 2015-09-24 MED ORDER — ASPIRIN 81 MG PO CHEW: 81.0000 mg | CHEWABLE_TABLET | ORAL | Status: DC

## 2015-09-24 MED ORDER — SERTRALINE HCL 100 MG PO TABS
100.0000 mg | ORAL_TABLET | Freq: Every day | ORAL | Status: DC
  Administered 2015-09-24 – 2015-09-29 (×6): 100 mg via ORAL
  Filled 2015-09-24 (×6): qty 1

## 2015-09-24 MED ORDER — OMEPRAZOLE MAGNESIUM 20 MG PO TBEC: 20.0000 mg | DELAYED_RELEASE_TABLET | Freq: Every day | ORAL | Status: DC | PRN

## 2015-09-24 MED ORDER — POTASSIUM CHLORIDE CRYS ER 20 MEQ PO TBCR
20.0000 meq | EXTENDED_RELEASE_TABLET | Freq: Two times a day (BID) | ORAL | Status: DC
  Administered 2015-09-24 – 2015-09-28 (×8): 20 meq via ORAL
  Filled 2015-09-24 (×8): qty 1

## 2015-09-24 MED ORDER — ACETAMINOPHEN 500 MG PO TABS
1000.0000 mg | ORAL_TABLET | Freq: Every day | ORAL | Status: DC | PRN
  Administered 2015-09-25 – 2015-09-28 (×5): 1000 mg via ORAL
  Filled 2015-09-24 (×5): qty 2

## 2015-09-24 MED ORDER — ALENDRONATE SODIUM 70 MG PO TABS: 70.0000 mg | ORAL_TABLET | ORAL | Status: DC

## 2015-09-24 MED ORDER — SPIRONOLACTONE 25 MG PO TABS
12.5000 mg | ORAL_TABLET | Freq: Every day | ORAL | Status: DC
  Administered 2015-09-24 – 2015-09-26 (×3): 12.5 mg via ORAL
  Filled 2015-09-24 (×3): qty 1

## 2015-09-24 MED ORDER — HEPARIN (PORCINE) IN NACL 2-0.9 UNIT/ML-% IJ SOLN
INTRAMUSCULAR | Status: DC | PRN
  Administered 2015-09-24: 500 mL

## 2015-09-24 MED ORDER — CALCIUM CARBONATE-VITAMIN D 500-200 MG-UNIT PO TABS
2.0000 | ORAL_TABLET | Freq: Every day | ORAL | Status: DC
  Administered 2015-09-25 – 2015-09-30 (×6): 2 via ORAL
  Filled 2015-09-24: qty 1
  Filled 2015-09-24 (×3): qty 2
  Filled 2015-09-24: qty 1
  Filled 2015-09-24: qty 2

## 2015-09-24 MED ORDER — ONDANSETRON HCL 4 MG/2ML IJ SOLN
4.0000 mg | Freq: Four times a day (QID) | INTRAMUSCULAR | Status: DC | PRN
  Administered 2015-09-26: 4 mg via INTRAVENOUS
  Filled 2015-09-24: qty 2

## 2015-09-24 MED ORDER — MIDAZOLAM HCL 10 MG/2ML IJ SOLN
INTRAMUSCULAR | Status: DC | PRN
  Administered 2015-09-24 (×2): 1 mg via INTRAVENOUS

## 2015-09-24 MED ORDER — GABAPENTIN 300 MG PO CAPS
300.0000 mg | ORAL_CAPSULE | Freq: Two times a day (BID) | ORAL | Status: DC
  Administered 2015-09-24 – 2015-09-30 (×12): 300 mg via ORAL
  Filled 2015-09-24 (×12): qty 1

## 2015-09-24 MED ORDER — MIDAZOLAM HCL 2 MG/2ML IJ SOLN
INTRAMUSCULAR | Status: AC
  Filled 2015-09-24: qty 2

## 2015-09-24 MED ORDER — FENTANYL CITRATE (PF) 100 MCG/2ML IJ SOLN
INTRAMUSCULAR | Status: DC | PRN
  Administered 2015-09-24: 25 ug via INTRAVENOUS

## 2015-09-24 MED ORDER — SODIUM CHLORIDE 0.9 % IV SOLN
INTRAVENOUS | Status: DC
  Administered 2015-09-24: 08:00:00 via INTRAVENOUS

## 2015-09-24 SURGICAL SUPPLY — 4 items
CATH BALLN WEDGE 5F 110CM (CATHETERS) ×3 IMPLANT
PACK CARDIAC CATHETERIZATION (CUSTOM PROCEDURE TRAY) ×3 IMPLANT
SHEATH FAST CATH BRACH 5F 5CM (SHEATH) ×3 IMPLANT
TRANSDUCER W/STOPCOCK (MISCELLANEOUS) ×3 IMPLANT

## 2015-09-24 NOTE — H&P (View-Only) (Signed)
Patient ID: [name], female   DOB: 08/07/1940, 74 y.o.   MRN: [id number] PCP: Dr Griffin  Primary Cardiologist: Dr Turner  Pulmonary: Dr Young HF Cardiology: Dr. Faolan [place]  HPI: [name] is a 74 y.o. female with a history of chronic diastolic CHF, moderate pulmonary HTN, HTN, CAD s/p CABG, COPD on home oxygen, and dyslipidemia referred to the HF clinic by Dr Turner.   Since last appointment, weight is up 5 lbs.  She has developed progressive dyspnea.  She is mildly short of breath walking around her house.  She is short of breath with any housework.  Uses a walker most of the time.  She cannot get out and go anywhere along now.  Occasional chest discomfort when she lies down in bed at night, but no exertional chest pain. She sleeps on 2 pillows chronically.     - ECHO 12/2014 EF 55-60%, peak PA pressure 68 mmHg, mild to moderate MS. - ECHO 2/17 EF 60-65%, mild LVH, mildly dilated RV with mildly decreased systolic function, PASP 29 mmHg (not sure we got a full envelope), moderate mitral stenosis with mean gradient 6 mmHg and MVA 1.37 cm^2, mild MR, mild AI, aortic sclerosis without significant stenosis.    Labs 01/09/2015 K 4.3 Creatinine 0.91 BNP  Labs 02/19/2015 K 4.1 Creatinine 1.03   ECG: NSR, PACs, LAFB, RBBB  Social History: The patient  reports that she quit smoking about 7 years ago. Her smoking use included Cigarettes. She has a 47 pack-year smoking history. She has never used smokeless tobacco. She reports that she does not drink alcohol or use illicit drugs.   Family History: The patient's family history includes Anemia in her mother; Emphysema in her father; Esophageal cancer in her daughter.   ROS: All systems negative except as listed in HPI, PMH and Problem List.  SH:  Social History   Social History  . Marital Status: Married    Spouse Name: N/A  . Number of Children: N/A  . Years of Education: N/A   Occupational History  . Not on file.   Social History Main  Topics  . Smoking status: Former Smoker -- 1.00 packs/day for 47 years    Types: Cigarettes    Quit date: 07/26/2007  . Smokeless tobacco: Never Used  . Alcohol Use: No  . Drug Use: No  . Sexual Activity: No   Other Topics Concern  . Not on file   Social History Narrative   Husband bilateral left amputee for atheorsclerotic disease    FH:  Family History  Problem Relation Age of Onset  . Anemia Mother   . Emphysema Father   . Esophageal cancer Daughter     Past Medical History  Diagnosis Date  . COPD (chronic obstructive pulmonary disease) (HCC)   . Pulmonary HTN (HCC)     PASP 50-55mmHg by echo8/2014  . H/O: rheumatic fever   . History of rheumatic heart disease     X 3-LAST TIME WHEN PATIENT WAS 75 YRS OLD  . Sleep apnea     intolerant to CPAP  . Hypertension   . H/O right bundle branch block   . Bursitis, shoulder     bilateral  . GERD (gastroesophageal reflux disease)   . H/O hiatal hernia   . Anxiety   . Depression   . Varicose veins   . PUD (peptic ulcer disease)   . 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 DJD (degenerative joint   disease)   . OA (osteoarthritis)   . Hyperlipidemia   . Urinary incontinence   . Pulmonary HTN (HCC)   . Thyroid nodule   . CHF (congestive heart failure) (HCC)     chronic diasotlic CHF    Current Outpatient Prescriptions  Medication Sig Dispense Refill  . calcium-vitamin D (OSCAL WITH D) 500-200 MG-UNIT per tablet Take 2 tablets by mouth daily with breakfast.    . furosemide (LASIX) 80 MG tablet Take 1 tablet (80 mg total) by mouth daily. Take 1 extra tab for weight 185 lb or greater 90 tablet 2  . gabapentin (NEURONTIN) 100 MG capsule Take 2 capsules by mouth 2 (two) times daily. 2 in am 2 in pm    . loratadine (CLARITIN) 10 MG tablet Take 10 mg by mouth daily.    . metoprolol (LOPRESSOR) 50 MG tablet Take 1 tablet by mouth two  times daily 180 tablet 0  . omeprazole (PRILOSEC OTC) 20 MG tablet Take 20 mg by mouth  daily as needed (GERD).     . potassium chloride SA (K-DUR,KLOR-CON) 20 MEQ tablet Take 20 mEq by mouth 2 (two) times daily.    . rivaroxaban (XARELTO) 20 MG TABS tablet Take 1 tablet (20 mg total) by mouth at bedtime. 90 tablet 2  . sertraline (ZOLOFT) 100 MG tablet Take 100 mg by mouth at bedtime.     . simvastatin (ZOCOR) 40 MG tablet Take 1 tablet by mouth  every evening 90 tablet 0  . spironolactone (ALDACTONE) 25 MG tablet Take 0.5 tablets (12.5 mg total) by mouth daily. 15 tablet 3  . XARELTO 20 MG TABS tablet Take 1 tablet by mouth at  bedtime 90 tablet 0  . nitroGLYCERIN (NITROSTAT) 0.4 MG SL tablet Place 1 tablet (0.4 mg total) under the tongue every 5 (five) minutes as needed for chest pain. 25 tablet 3   No current facility-administered medications for this encounter.    Filed Vitals:   09/14/15 1411  BP: 114/70  Pulse: 82  Resp: 22  Weight: 186 lb 4 oz (84.482 kg)  SpO2: 96%    PHYSICAL EXAM:  General:  NAD, wearing oxygen. HEENT: normal Neck: supple. JVP 7-8. Carotids 2+ bilaterally; no bruits. No lymphadenopathy or thryomegaly appreciated. Cor: PMI normal. Regular rate & rhythm. No rubs, gallops.  2/6 SEM RUSB.  Lungs: clear. On 2 liters [place] Abdomen: soft, nontender, nondistended. No hepatosplenomegaly. No bruits or masses. Good bowel sounds. Extremities: no cyanosis, clubbing, rash.  Trace ankle edema.  Neuro: alert & orientedx3, cranial nerves grossly intact. Moves all 4 extremities w/o difficulty. Affect pleasant.  ASSESSMENT & PLAN: 1. Chronic diastolic CHF/valvular heart disease: Echo 2/17 reviewed.  EF 60-65% with moderate mitral stenosis. NYHA IIIb symptoms.  She does not appear significantly volume overloaded on exam.  It is difficult to determine how much of her symptomatology is due to mitral stenosis and how much is due to COPD.  - Continue lasix 80 mg daily and KCl 40 qam/20 qpm.  She will need RHC to ascertain volume status, see below.   - Continue 12.5  mg spironolactone daily.  - BMET/BNP today.  - Discussed limiting fluid intake to < 2 liters per day and low salt diet.  2. CAD S/P CABG: No worrisome chest pain. Continue statin and metoprolol. On Xarelto so no aspirin. Check lipids today.  3. Atrial fibrillation: Paroxysmal. CHADSVASC score is 4 (age > 65, female, HTN, CHF).   - Continue Xarelto/metoprolol. CBC today.    4. Moderate pulmonary HTN: Peak PA pressure 68 mmHg noted ECHO 12/2014, PASP only 29 mmHg on most recent echo 2/17 but suspect this is underestimated.  I suspect that she has pulmonary venous hypertension in the setting of diastolic CHF/mitral stenosis.  I am going to arrange for RHC to assess.  5. COPD: Followed by Dr Young. On 2 liters [place]. I am going to arrange for PFTs to try to quantify severity of COPD.  6.  OSA: intolerant CPAP 8.  Mitral stenosis: Rheumatic mitral stenosis.  Moderate on today's echo.  It is difficult to tell how much of her symptoms are due to mitral stenosis/CHF and how much to COPD.  She has very prominent exertional dyspnea.  - I am going to arrange for TEE to assess the mitral valve more closely.  I do not think that she would be a surgical MV repair/replacement candidate give comorbidities.  She may, however, be a valvuloplasty candidate.  We discussed risks/benefits of this procedure and she agrees to proceed with it.  - I am going to arrange for LHC/RHC to fully assess mitral valve and also to measure filling pressures and PA pressure.  I will assess for progressive CAD as cause of symptoms as well.   We discussed risks/benefits of this procedure and she agrees to proceed with it.  She will need to hold Xarelto for 3 days prior to cardiac cath.   Levon Penning 09/15/2015     

## 2015-09-24 NOTE — H&P (Signed)
Advanced Heart Failure Team History and Physical Note   PCP: Dr Valentina Lucks  Primary Cardiologist: Dr Mayford Knife  Pulmonary: Dr Maple Hudson HF Cardiology: Dr. Shirlee Latch  Reason for Admission: A/C Diastolic HF, Aortic Valve vegetation.  HPI:   [name] is a 75 y.o. female with a history of chronic diastolic CHF, moderate pulmonary HTN, HTN, CAD s/p CABG, COPD on home oxygen, and dyslipidemia referred to the HF clinic by Dr Mayford Knife.   She presented today for [facility]. Pre-procedure TEE to further assess MV showed Mild-mod MS, EF 60%. Of note, pt has a 0.8 cm mobile mass on the LV side of the aortic valve. Blood cultures and CBC ordered. States she has been on an antibiotic recently for tooth pain. Concern for endocarditis.  RHC numbers also concerning for Volume overload.   TEE: Normal LV size with mild LV hypertrophy. EF 60%. Normal wall motion. Moderately dilated RV with mildly decreased systolic function. Moderate tricuspid regurgitation. The mitral valve was moderately calcified with relatively fixed posterior leaflet. Mean gradient 5 mmHg with MVA by PHT 1.9 cm^2. Mild to moderate mitral stenosis with mild mitral regurgitation. The aortic valve was trileaflet and moderately calcified. There was a small 0.8 cm mobile mass on the LV side of the aortic valve. There was mild aortic insufficiency and mild aortic stenosis. There was probably a small PFO (weakly positive bubble study). Moderate biatrial enlargement with no LAA thrombus. Grade III-IV plaque in the descending thoracic aorta.   RHC Procedural Findings: Hemodynamics (mmHg) RA mean 16 RV 65/14 PA 71/28, mean 45 PCWP mean 32 Oxygen saturations: PA 59% AO 95% Cardiac Output (Fick) 4.85  Cardiac Index (Fick) 2.51 PVR 2.68 WU  Review of Systems: [y] = yes,  = no   General: Weight gain [y]; Weight loss ; Anorexia ; Fatigue ; Fever ; Chills ; Weakness   Cardiac: Chest pain/pressure ; Resting SOB ;  Exertional SOB [y]; Orthopnea ; Pedal Edema ; Palpitations ; Syncope ; Presyncope ; Paroxysmal nocturnal dyspnea[ ]   Pulmonary: Cough ; Wheezing[ ] ; Hemoptysis[ ] ; Sputum ; Snoring   GI: Vomiting[ ] ; Dysphagia[ ] ; Melena[ ] ; Hematochezia ; Heartburn[ ] ; Abdominal pain ; Constipation ; Diarrhea ; BRBPR   GU: Hematuria[ ] ; Dysuria ; Nocturia[ ]   Vascular: Pain in legs with walking ; Pain in feet with lying flat ; Non-healing sores ; Stroke ; TIA ; Slurred speech ;  Neuro: Headaches[ ] ; Vertigo[ ] ; Seizures[ ] ; Paresthesias[ ] ;Blurred vision ; Diplopia ; Vision changes   Ortho/Skin: Arthritis [y]; Joint pain [y]; Muscle pain ; Joint swelling ; Back Pain ; Rash   Psych: Depression[ ] ; Anxiety[ ]   Heme: Bleeding problems ; Clotting disorders ; Anemia   Endocrine: Diabetes ; Thyroid dysfunction[ ]    Home Medications Prior to Admission medications   Medication Sig Start Date End Date Taking? Authorizing Provider  acetaminophen (TYLENOL) 500 MG tablet Take 1,000 mg by mouth daily as needed for headache.   Yes Historical Provider, MD  alendronate (FOSAMAX) 70 MG tablet Take 70 mg by mouth once a week. 09/18/15  Yes Historical Provider, MD  calcium-vitamin D (OSCAL WITH D) 500-200 MG-UNIT per tablet Take 2 tablets by mouth daily with breakfast.   Yes Historical Provider, MD  furosemide (LASIX) 80 MG  tablet Take 1 tablet (80 mg total) by mouth daily. Take 1 extra tab for weight 185 lb or greater 02/09/15  Yes Dolores Patty, MD  gabapentin (NEURONTIN) 300 MG capsule Take 300 mg by mouth 2 (two) times daily. 07/28/15  Yes Historical Provider, MD  loratadine (CLARITIN) 10 MG tablet Take 10 mg by mouth daily.   Yes Historical Provider, MD  metoprolol (LOPRESSOR) 50 MG tablet Take 1 tablet by mouth two  times daily 08/10/15  Yes Quintella Reichert, MD  nitroGLYCERIN (NITROSTAT) 0.4 MG SL tablet Place 1 tablet (0.4 mg  total) under the tongue every 5 (five) minutes as needed for chest pain. 12/23/13  Yes Quintella Reichert, MD  omeprazole (PRILOSEC OTC) 20 MG tablet Take 20 mg by mouth daily as needed (GERD).    Yes Historical Provider, MD  OXYGEN Inhale 2 L into the lungs continuous.   Yes Historical Provider, MD  potassium chloride SA (K-DUR,KLOR-CON) 20 MEQ tablet Take 20 mEq by mouth 2 (two) times daily.   Yes Historical Provider, MD  rivaroxaban (XARELTO) 20 MG TABS tablet Take 1 tablet (20 mg total) by mouth at bedtime. 02/16/15  Yes Quintella Reichert, MD  sertraline (ZOLOFT) 100 MG tablet Take 100 mg by mouth at bedtime.    Yes Historical Provider, MD  simvastatin (ZOCOR) 40 MG tablet Take 1 tablet by mouth  every evening 08/10/15  Yes Quintella Reichert, MD  spironolactone (ALDACTONE) 25 MG tablet Take 0.5 tablets (12.5 mg total) by mouth daily. 06/10/15  Yes Dolores Patty, MD  XARELTO 20 MG TABS tablet Take 1 tablet by mouth at  bedtime Patient not taking: Reported on 09/22/2015 08/10/15   Quintella Reichert, MD    Past Medical History: Past Medical History  Diagnosis Date  . COPD (chronic obstructive pulmonary disease) (HCC)   . Pulmonary HTN (HCC)     PASP 50-64mmHg by [id number]  . H/O: rheumatic fever   . History of rheumatic heart disease     X 3-LAST TIME WHEN PATIENT WAS 75 YRS OLD  . Sleep apnea     intolerant to CPAP  . Hypertension   . H/O right bundle branch block   . Bursitis, shoulder     bilateral  . GERD (gastroesophageal reflux disease)   . H/O hiatal hernia   . Anxiety   . Depression   . Varicose veins   . PUD (peptic ulcer disease)   . 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 DJD (degenerative joint disease)   . OA (osteoarthritis)   . Hyperlipidemia   . Urinary incontinence   . Pulmonary HTN (HCC)   . Thyroid nodule   . CHF (congestive heart failure) (HCC)     chronic diasotlic CHF    Past Surgical History: Past Surgical History  Procedure Laterality Date  . Bilateral  salpingoophorectomy    . Vaginal delivery      x4  . Thyroidectomy, partial  2010    Dr Michaell Cowing  . Tubal ligation    . Total abdominal hysterectomy      1990  . Coronary artery bypass graft  06/21/2000    x4  . Cardiac catheterization  02/11/2008, 01/30/2004, 06/14/2000  . Bladder tack      Family History  Problem Relation Age of Onset  . Anemia Mother   . Emphysema Father   . Esophageal cancer Daughter     Social History: Social History   Social History  .  Marital Status: Married    Spouse Name: N/A  . Number of Children: N/A  . Years of Education: N/A   Social History Main Topics  . Smoking status: Former Smoker -- 1.00 packs/day for 47 years    Types: Cigarettes    Quit date: 07/26/2007  . Smokeless tobacco: Never Used  . Alcohol Use: No  . Drug Use: No  . Sexual Activity: No   Other Topics Concern  . None   Social History Narrative   Husband bilateral left amputee for atheorsclerotic disease    Allergies:  Allergies  Allergen Reactions  . Prednisone Rash    jittery  . Xopenex [Levalbuterol]     Made mouth and throat sore  . Cefpodoxime Other (See Comments)    Yeast infections   . Clindamycin Other (See Comments)    Unknown   . Levalbuterol Hcl Other (See Comments)    Unknown   . Lipitor [Atorvastatin]     Other reaction(s): Other (See Comments) Made joint start hurting  Muscle aches  . Other Swelling    Venholin HFA  . Penicillins Other (See Comments)    Unknown, childhood reaction   . Tape Other (See Comments)    Bleeding  . Ventolin [Kdc:Albuterol]     "jittery"  . Codeine Other (See Comments)    anxious jittery  . Hydrocodone-Acetaminophen Anxiety  . Latex Itching and Rash    Objective:    Vital Signs:   Temp:  [97.7 F (36.5 C)] 97.7 F (36.5 C) (03/02 0716) Pulse Rate:  [44-107] 60 (03/02 1217) Resp:  [16-28] 23 (03/02 1217) BP: (126-183)/(49-100) 145/57 mmHg (03/02 1217) SpO2:  [0 %-100 %] 96 % (03/02 1217) Weight:  [185 lb  (83.915 kg)] 185 lb (83.915 kg) (03/02 0716)   Filed Weights   09/24/15 0716  Weight: 185 lb (83.915 kg)    Physical Exam: General: NAD, wearing oxygen. HEENT: normal Neck: supple. JVP elevated, 10+. Carotids 2+ bilaterally; no bruits. No thyromegaly or nodule noted Cor: PMI normal. Regular rate & rhythm. No rubs, gallops. 2/6 SEM RUSB.  Lungs: clear. On 2 liters [place] Abdomen: soft, NT, ND, no HSM. No bruits or masses. +BS  Extremities: no cyanosis, clubbing, rash. Trace ankle edema.  Neuro: alert & orientedx3, cranial nerves grossly intact. Moves all 4 extremities w/o difficulty. Affect pleasant.  Telemetry:  NSR  Labs: Basic Metabolic Panel: No results for input(s): NA, K, CL, CO2, GLUCOSE, BUN, CREATININE, CALCIUM, MG, PHOS in the last 168 hours.  Liver Function Tests: No results for input(s): AST, ALT, ALKPHOS, BILITOT, PROT, ALBUMIN in the last 168 hours. No results for input(s): LIPASE, AMYLASE in the last 168 hours. No results for input(s): AMMONIA in the last 168 hours.  CBC: No results for input(s): WBC, NEUTROABS, HGB, HCT, MCV, PLT in the last 168 hours.  Cardiac Enzymes: No results for input(s): CKTOTAL, CKMB, CKMBINDEX, TROPONINI in the last 168 hours.  BNP: BNP (last 3 results)  Recent Labs  09/14/15 1519  BNP 208.5*    ProBNP (last 3 results)  Recent Labs  01/09/15 0907  PROBNP 245.0*     CBG: No results for input(s): GLUCAP in the last 168 hours.  Coagulation Studies: No results for input(s): LABPROT, INR in the last 72 hours.  Other results:   Imaging:  No results found.   Assessment/Plan   1. AV vegetation - Blood Cx and CBC pending - Recent tooth pain. Worry about infection leading to an endocarditis - ID consulted.  Look forward to recommendations.  Initial impression to hold off on IV ABX for now.  2. Acute on chronic diastolic CHF/valvular heart disease: Echo 2/17 EF 60-65% with moderate mitral stenosis. NYHA IIIb  symptoms.  - Volume overloaded by RHC numbers. Will admit for IV diuresis in addition to work up of #1. Start with 80 mg IV lasix BID and follow.  Continue previous K supp for now. BMET pending.   - Continue 12.5 mg spironolactone daily.  3. CAD S/P CABG:  - Stable. No worrisome chest pain. Continue statin and metoprolol. On Xarelto so no aspirin. - LHC not performed today with #1.  4. Atrial fibrillation: Paroxysmal. CHADSVASC score is 4 (age > 60, female, HTN, CHF).  - Continue Xarelto/metoprolol.  - CBC pending.  5. Moderate pulmonary HTN: Peak PA pressure 68 mmHg noted ECHO 12/2014, PASP only 29 mmHg on most recent echo 2/17 but suspect this is underestimated. - Mean PA pressure on cath today 45. PVR low, pulmonary venous hypertension.  6.COPD:  - Followed by Dr Maple Hudson. On 2 liters [place], chronically.  7. OSA: intolerant CPAP 8.Mitral stenosis: Rheumatic mitral stenosis. - Mild to moderate on TEE.   Length of Stay:   Graciella Freer PA-C 09/24/2015, 12:25 PM  Advanced Heart Failure Team Pager [phone] (M-F; 7a - 4p)  Please contact [facility] for night-coverage after hours (4p -7a ) and weekends on [email or web address]  Patient seen with PA, agree with the above note.  1. Acute on chronic diastolic CHF with RV failure: She is markedly volume overloaded, PCWP 32.  Possible that mitral stenosis plays a role, but appeared only mild to moderate by TEE.  She will be admitted for diuresis.  - Lasix 80 mg IV bid to begin with.  2. Pulmonary hypertension: Primarily pulmonary venous hypertension due to elevated left atrial pressure.  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  Patient recently had tooth pain and was on antibiotics for her teeth.  This may be a potential source.  Send blood cultures and CBC, will involve ID. No subjective fevers.  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  By TEE, only appears mild to moderate.  Had planned to do right and  left heart cath and cross aortic valve to assess MS by cath, but with possible aortic valve vegetation we held off on left heart cath and crossing valve.  Down the road could be valvuloplasty candidate.  5. COPD: Severe by PFTs.  This certainly contributes to her symptoms.  We are admitted for diuresis and ID evaluation.   Marca Ancona 09/24/2015 1:34 PM

## 2015-09-24 NOTE — Interval H&P Note (Signed)
History and Physical Interval Note:  09/24/2015 11:51 AM  [name]  has presented today for surgery, with the diagnosis of stenosis  The various methods of treatment have been discussed with the patient and family. After consideration of risks, benefits and other options for treatment, the patient has consented to  Procedure(s): Right Heart Cath (N/A) as a surgical intervention .  The patient's history has been reviewed, patient examined, no change in status, stable for surgery.  I have reviewed the patient's chart and labs.  Questions were answered to the patient's satisfaction.     Austan Nicholl [employer]

## 2015-09-24 NOTE — Interval H&P Note (Signed)
History and Physical Interval Note:  09/24/2015 9:11 AM  [name]  has presented today for surgery, with the diagnosis of METROSTENOSIS   The various methods of treatment have been discussed with the patient and family. After consideration of risks, benefits and other options for treatment, the patient has consented to  Procedure(s): TRANSESOPHAGEAL ECHOCARDIOGRAM (TEE) (N/A) as a surgical intervention .  The patient's history has been reviewed, patient examined, no change in status, stable for surgery.  I have reviewed the patient's chart and labs.  Questions were answered to the patient's satisfaction.     Andren Bethea [employer]

## 2015-09-24 NOTE — Consult Note (Signed)
[facility]    Date of Admission:  09/24/2015          Reason for Consult: Aortic valve vegetation    Referring Physician: Dr. Marca Ancona   Principal Problem:   Aortic valve vegetation Active Problems:   Acute on chronic diastolic (congestive) heart failure (HCC)   Acute diastolic (congestive) heart failure (HCC)   . [START ON 09/28/2015] alendronate  70 mg Oral Weekly  . [START ON 09/25/2015] calcium-vitamin D  2 tablet Oral Q breakfast  . furosemide  80 mg Intravenous BID  . gabapentin  300 mg Oral BID  . loratadine  10 mg Oral Daily  . metoprolol  50 mg Oral BID  . pantoprazole  40 mg Oral Daily  . potassium chloride SA  20 mEq Oral BID  . rivaroxaban  20 mg Oral QHS  . sertraline  100 mg Oral QHS  . simvastatin  40 mg Oral q1800  . sodium chloride flush  3 mL Intravenous Q12H  . spironolactone  12.5 mg Oral Daily    Recommendations: 1. Observe off antibiotics 2. Await results of today's blood cultures 3. Obtain more blood cultures and neck 48 hours 4. We will call her pharmacy and review her antibiotic history   Assessment: [name] had the incidental finding of a small mobile aortic valve vegetation this morning. She had a recent illness and sounds like she may have had some fever with night sweats. It is possible that she could have subacute bacterial endocarditis that was partially treated by her recent course of clarithromycin. She is very stable tonight and I favor observation off of antibiotics with serial exams and blood cultures. We will follow with you.    HPI: [name] is a 75 y.o. female with multiple medical problems who has suffered from heart failure and worsening shortness of breath over the past several months. She was referred for right and left heart catheterization. This morning before her procedure she underwent TEE which revealed a 0.8 cm mobile mass on the left ventricular side of the aortic valve. There  was only mild aortic insufficiency and mild aortic stenosis. 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 She was told that she had rheumatic heart disease and a heart murmur as a young woman.  About 3 weeks ago she developed what she thought was in "a virus". Her son had been sick with a similar illness. She had diffuse muscle aches, and nonproductive cough and some night sweats. She also noted some soreness of her gum under her front lower teeth. She saw her primary care provider and was started on clarithromycin on 08/28/2015 and took it for 10 days. She has continued to have some soreness along her gumline but the myalgias, cough and night sweats resolved completely.   Review of Systems: Review of Systems  Constitutional: Positive for diaphoresis. Negative for fever, chills, weight loss and malaise/fatigue.  HENT: Negative for sore throat.   Respiratory: Positive for cough and shortness of breath. Negative for sputum production.   Cardiovascular: Negative for chest pain.  Gastrointestinal: Negative for nausea, vomiting and diarrhea.  Genitourinary: Negative for dysuria.  Musculoskeletal: Positive for myalgias. Negative for joint pain.  Skin: Negative for rash.  Neurological: Negative for headaches.  Psychiatric/Behavioral: Positive for depression.    Past Medical History  Diagnosis Date  . COPD (chronic obstructive pulmonary disease) (HCC)   .  Pulmonary HTN (HCC)     PASP 50-80mmHg by [id number]  . H/O: rheumatic fever   . History of rheumatic heart disease     X 3-LAST TIME WHEN PATIENT WAS 75 YRS OLD  . Sleep apnea     intolerant to CPAP  . Hypertension   . H/O right bundle branch block   . Bursitis, shoulder     bilateral  . GERD (gastroesophageal reflux disease)   . H/O hiatal hernia   . Anxiety   . Depression   . Varicose veins   . PUD (peptic ulcer disease)   . 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 DJD  (degenerative joint disease)   . OA (osteoarthritis)   . Hyperlipidemia   . Urinary incontinence   . Pulmonary HTN (HCC)   . Thyroid nodule   . CHF (congestive heart failure) (HCC)     chronic diasotlic CHF    Social History  Substance Use Topics  . Smoking status: Former Smoker -- 1.00 packs/day for 47 years    Types: Cigarettes    Quit date: 07/26/2007  . Smokeless tobacco: Never Used  . Alcohol Use: No    Family History  Problem Relation Age of Onset  . Anemia Mother   . Emphysema Father   . Esophageal cancer Daughter    Allergies  Allergen Reactions  . Prednisone Rash    jittery  . Xopenex [Levalbuterol]     Made mouth and throat sore  . Cefpodoxime Other (See Comments)    Yeast infections   . Clindamycin Other (See Comments)    Unknown   . Levalbuterol Hcl Other (See Comments)    Unknown   . Lipitor [Atorvastatin]     Other reaction(s): Other (See Comments) Made joint start hurting  Muscle aches  . Other Swelling    Venholin HFA  . Penicillins Other (See Comments)    Unknown, childhood reaction   . Tape Other (See Comments)    Bleeding  . Ventolin [Kdc:Albuterol]     "jittery"  . Codeine Other (See Comments)    anxious jittery  . Hydrocodone-Acetaminophen Anxiety  . Latex Itching and Rash    OBJECTIVE: Blood pressure 150/130, pulse 70, temperature 98.5 F (36.9 C), temperature source Oral, resp. rate 28, height 5\' 8"  (1.727 m), weight 186 lb 9.6 oz (84.641 kg), SpO2 94 %.  Physical Exam  Constitutional: She is oriented to person, place, and time.  She has lost about 30 pounds over the past few years after her daughter died and she was grieving. She doesn't think that she has continued to lose weight over the last 6 months. She actually thinks she may have gained a few pounds of dry weight. She is alert and in good spirits.  HENT:  Mouth/Throat: No oropharyngeal exudate.  Her teeth are in poor condition.  Eyes: Conjunctivae are normal.  No  conjunctival hemorrhages  Cardiovascular: Normal rate and regular rhythm.   Murmur heard. 2/6 systolic murmur.  Pulmonary/Chest: Effort normal and breath sounds normal. She has no wheezes. She has no rales.  Distant breath sounds.  Abdominal: Soft. There is no tenderness.  Musculoskeletal: Normal range of motion.  Neurological: She is alert and oriented to person, place, and time.  Skin: No rash noted.  No splinter hemorrhages.  Psychiatric: Mood and affect normal.    Lab Results Lab Results  Component Value Date   WBC 6.8 09/24/2015   WBC 6.3 09/24/2015   HGB 10.3*  09/24/2015   HGB 10.2* 09/24/2015   HCT 34.9* 09/24/2015   HCT 36.0 09/24/2015   MCV 87.0 09/24/2015   MCV 86.7 09/24/2015   PLT 214 09/24/2015   PLT 216 09/24/2015    Lab Results  Component Value Date   CREATININE 0.86 09/24/2015   BUN 8 09/24/2015   NA 144 09/24/2015   K 3.5 09/24/2015   CL 99* 09/24/2015   CO2 34* 09/24/2015    Lab Results  Component Value Date   ALT 15 09/24/2015   AST 21 09/24/2015   ALKPHOS 84 09/24/2015   BILITOT 0.5 09/24/2015     Microbiology: No results found for this or any previous visit (from the past 240 hour(s)).  Cliffton Asters, MD [facility] [phone] pager   [phone] cell 09/24/2015, 5:08 PM

## 2015-09-24 NOTE — CV Procedure (Addendum)
Procedure: TEE  Indication: Mitral stenosis, aortic stenosis.   Findings: Please see echo section for full report.  Normal LV size with mild LV hypertrophy.  EF 60%.  Normal wall motion.  Moderately dilated RV with mildly decreased systolic function.  Moderate tricuspid regurgitation.  The mitral valve was moderately calcified with relatively fixed posterior leaflet; it was rheumatic-appearing.  Mean gradient 5 mmHg with MVA by PHT 1.9 cm^2.  Mild to moderate mitral stenosis with mild mitral regurgitation.  The aortic valve was trileaflet and moderately calcified.  There was a small 0.8 cm mobile mass on the LV side of the aortic valve.  There was mild aortic insufficiency and mild aortic stenosis.  There was probably a small PFO (weakly positive bubble study).  Moderate biatrial enlargement with no LAA thrombus.  Grade III-IV plaque in the descending thoracic aorta.   Mild to moderate mitral stenosis.    Possible aortic valve vegetation.  Needs blood cultures, CBC.  Will discuss with ID treatment versus monitoring.  Patient states she was on antibiotics for tooth pain, ?tooth source endocarditis.    [name] 09/24/2015

## 2015-09-25 DIAGNOSIS — I050 Rheumatic mitral stenosis: Secondary | ICD-10-CM

## 2015-09-25 LAB — CBC WITH DIFFERENTIAL/PLATELET
BASOS PCT: 0 %
Basophils Absolute: 0 10*3/uL (ref 0.0–0.1)
EOS ABS: 0.3 10*3/uL (ref 0.0–0.7)
Eosinophils Relative: 3 %
HCT: 32.3 % — ABNORMAL LOW (ref 36.0–46.0)
HEMOGLOBIN: 9 g/dL — AB (ref 12.0–15.0)
LYMPHS ABS: 1.9 10*3/uL (ref 0.7–4.0)
Lymphocytes Relative: 23 %
MCH: 24.4 pg — ABNORMAL LOW (ref 26.0–34.0)
MCHC: 27.9 g/dL — ABNORMAL LOW (ref 30.0–36.0)
MCV: 87.5 fL (ref 78.0–100.0)
MONO ABS: 0.9 10*3/uL (ref 0.1–1.0)
MONOS PCT: 11 %
NEUTROS PCT: 63 %
Neutro Abs: 5.3 10*3/uL (ref 1.7–7.7)
PLATELETS: 205 10*3/uL (ref 150–400)
RBC: 3.69 MIL/uL — ABNORMAL LOW (ref 3.87–5.11)
RDW: 14.6 % (ref 11.5–15.5)
WBC: 8.4 10*3/uL (ref 4.0–10.5)

## 2015-09-25 LAB — BASIC METABOLIC PANEL
Anion gap: 9 (ref 5–15)
BUN: 8 mg/dL (ref 6–20)
CALCIUM: 9.1 mg/dL (ref 8.9–10.3)
CHLORIDE: 93 mmol/L — AB (ref 101–111)
CO2: 38 mmol/L — AB (ref 22–32)
CREATININE: 0.84 mg/dL (ref 0.44–1.00)
GFR calc Af Amer: >60 mL/min (ref >60)
GFR calc non Af Amer: >60 mL/min (ref >60)
GLUCOSE: 104 mg/dL — AB (ref 65–99)
Potassium: 3.9 mmol/L (ref 3.5–5.1)
Sodium: 140 mmol/L (ref 135–145)

## 2015-09-25 NOTE — Discharge Instructions (Addendum)
Transesophageal Echocardiogram Transesophageal echocardiography (TEE) is a picture test of your heart using sound waves. The pictures taken can give very detailed pictures of your heart. This can help your doctor see if there are problems with your heart. TEE can check:  If your heart has blood clots in it.  How well your heart valves are working.  If you have an infection on the inside of your heart.  Some of the major arteries of your heart.  If your heart valve is working after a Psychologist, forensic.  Your heart before a procedure that uses a shock to your heart to get the rhythm back to normal. BEFORE THE PROCEDURE  Do not eat or drink for 6 hours before the procedure or as told by your doctor.  Make plans to have someone drive you home after the procedure. Do not drive yourself home.  An IV tube will be put in your arm. PROCEDURE  You will be given a medicine to help you relax (sedative). It will be given through the IV tube.  A numbing medicine will be sprayed or gargled in the back of your throat to help numb it.  The tip of the probe is placed into the back of your mouth. You will be asked to swallow. This helps to pass the probe into your esophagus.  Once the tip of the probe is in the right place, your doctor can take pictures of your heart.  You may feel pressure at the back of your throat. AFTER THE PROCEDURE  You will be taken to a recovery area so the sedative can wear off.  Your throat may be sore and scratchy. This will go away slowly over time.  You will go home when you are fully awake and able to swallow liquids.  You should have someone stay with you for the next 24 hours.  Do not drive or operate machinery for the next 24 hours.   This information is not intended to replace advice given to you by your health care provider. Make sure you discuss any questions you have with your health care provider.   Document Released: 05/08/2009 Document Revised: 07/16/2013  Document Reviewed: 01/10/2013 Elsevier Interactive Patient Education 2016 [employer].   Information on my medicine - XARELTO (Rivaroxaban)  Why was Xarelto prescribed for you? Xarelto was prescribed for you to reduce the risk of a blood clot forming that can cause a stroke if you have a medical condition called atrial fibrillation (a type of irregular heartbeat).  What do you need to know about xarelto ? Take your Xarelto ONCE DAILY at the same time every day with your evening meal. If you have difficulty swallowing the tablet whole, you may crush it and mix in applesauce just prior to taking your dose.  Take Xarelto exactly as prescribed by your doctor and DO NOT stop taking Xarelto without talking to the doctor who prescribed the medication.  Stopping without other stroke prevention medication to take the place of Xarelto may increase your risk of developing a clot that causes a stroke.  Refill your prescription before you run out.  After discharge, you should have regular check-up appointments with your healthcare provider that is prescribing your Xarelto.  In the future your dose may need to be changed if your kidney function or weight changes by a significant amount.  What do you do if you miss a dose? If you are taking Xarelto ONCE DAILY and you miss a dose, take it as soon  as you remember on the same day then continue your regularly scheduled once daily regimen the next day. Do not take two doses of Xarelto at the same time or on the same day.   Important Safety Information A possible side effect of Xarelto is bleeding. You should call your healthcare provider right away if you experience any of the following: ? Bleeding from an injury or your nose that does not stop. ? Unusual colored urine (red or dark brown) or unusual colored stools (red or black). ? Unusual bruising for unknown reasons. ? A serious fall or if you hit your head (even if there is no  bleeding).  Some medicines may interact with Xarelto and might increase your risk of bleeding while on Xarelto. To help avoid this, consult your healthcare provider or pharmacist prior to using any new prescription or non-prescription medications, including herbals, vitamins, non-steroidal anti-inflammatory drugs (NSAIDs) and supplements.  This website has more information on Xarelto: VisitDestination.com.br.   Heart Failure Heart failure is a condition in which the heart has trouble pumping blood. This means your heart does not pump blood efficiently for your body to work well. In some cases of heart failure, fluid may back up into your lungs or you may have swelling (edema) in your lower legs. Heart failure is usually a long-term (chronic) condition. It is important for you to take good care of yourself and follow your health care provider's treatment plan. CAUSES  Some health conditions can cause heart failure. Those health conditions include:  High blood pressure (hypertension). Hypertension causes the heart muscle to work harder than normal. When pressure in the blood vessels is high, the heart needs to pump (contract) with more force in order to circulate blood throughout the body. High blood pressure eventually causes the heart to become stiff and weak.  Coronary artery disease (CAD). CAD is the buildup of cholesterol and fat (plaque) in the arteries of the heart. The blockage in the arteries deprives the heart muscle of oxygen and blood. This can cause chest pain and may lead to a heart attack. High blood pressure can also contribute to CAD.  Heart attack (myocardial infarction). A heart attack occurs when one or more arteries in the heart become blocked. The loss of oxygen damages the muscle tissue of the heart. When this happens, part of the heart muscle dies. The injured tissue does not contract as well and weakens the heart's ability to pump blood.  Abnormal heart valves. When the heart  valves do not open and close properly, it can cause heart failure. This makes the heart muscle pump harder to keep the blood flowing.  Heart muscle disease (cardiomyopathy or myocarditis). Heart muscle disease is damage to the heart muscle from a variety of causes. These can include drug or alcohol abuse, infections, or unknown reasons. These can increase the risk of heart failure.  Lung disease. Lung disease makes the heart work harder because the lungs do not work properly. This can cause a strain on the heart, leading it to fail.  Diabetes. Diabetes increases the risk of heart failure. High blood sugar contributes to high fat (lipid) levels in the blood. Diabetes can also cause slow damage to tiny blood vessels that carry important nutrients to the heart muscle. When the heart does not get enough oxygen and food, it can cause the heart to become weak and stiff. This leads to a heart that does not contract efficiently.  Other conditions can contribute to heart failure. These  include abnormal heart rhythms, thyroid problems, and low blood counts (anemia). Certain unhealthy behaviors can increase the risk of heart failure, including:  Being overweight.  Smoking or chewing tobacco.  Eating foods high in fat and cholesterol.  Abusing illicit drugs or alcohol.  Lacking physical activity. SYMPTOMS  Heart failure symptoms may vary and can be hard to detect. Symptoms may include:  Shortness of breath with activity, such as climbing stairs.  Persistent cough.  Swelling of the feet, ankles, legs, or abdomen.  Unexplained weight gain.  Difficulty breathing when lying flat (orthopnea).  Waking from sleep because of the need to sit up and get more air.  Rapid heartbeat.  Fatigue and loss of energy.  Feeling light-headed, dizzy, or close to fainting.  Loss of appetite.  Nausea.  Increased urination during the night (nocturia). DIAGNOSIS  A diagnosis of heart failure is based on  your history, symptoms, physical examination, and diagnostic tests. Diagnostic tests for heart failure may include:  Echocardiography.  Electrocardiography.  Chest X-ray.  Blood tests.  Exercise stress test.  Cardiac angiography.  Radionuclide scans. TREATMENT  Treatment is aimed at managing the symptoms of heart failure. Medicines, behavioral changes, or surgical intervention may be necessary to treat heart failure.  Medicines to help treat heart failure may include:  Angiotensin-converting enzyme (ACE) inhibitors. This type of medicine blocks the effects of a blood protein called angiotensin-converting enzyme. ACE inhibitors relax (dilate) the blood vessels and help lower blood pressure.  Angiotensin receptor blockers (ARBs). This type of medicine blocks the actions of a blood protein called angiotensin. Angiotensin receptor blockers dilate the blood vessels and help lower blood pressure.  Water pills (diuretics). Diuretics cause the kidneys to remove salt and water from the blood. The extra fluid is removed through urination. This loss of extra fluid lowers the volume of blood the heart pumps.  Beta blockers. These prevent the heart from beating too fast and improve heart muscle strength.  Digitalis. This increases the force of the heartbeat.  Healthy behavior changes include:  Obtaining and maintaining a healthy weight.  Stopping smoking or chewing tobacco.  Eating heart-healthy foods.  Limiting or avoiding alcohol.  Stopping illicit drug use.  Physical activity as directed by your health care provider.  Surgical treatment for heart failure may include:  A procedure to open blocked arteries, repair damaged heart valves, or remove damaged heart muscle tissue.  A pacemaker to improve heart muscle function and control certain abnormal heart rhythms.  An internal cardioverter defibrillator to treat certain serious abnormal heart rhythms.  A left ventricular assist  device (LVAD) to assist the pumping ability of the heart. HOME CARE INSTRUCTIONS   Take medicines only as directed by your health care provider. Medicines are important in reducing the workload of your heart, slowing the progression of heart failure, and improving your symptoms.  Do not stop taking your medicine unless directed by your health care provider.  Do not skip any dose of medicine.  Refill your prescriptions before you run out of medicine. Your medicines are needed every day.  Engage in moderate physical activity if directed by your health care provider. Moderate physical activity can benefit some people. The elderly and people with severe heart failure should consult with a health care provider for physical activity recommendations.  Eat heart-healthy foods. Food choices should be free of trans fat and low in saturated fat, cholesterol, and salt (sodium). Healthy choices include fresh or frozen fruits and vegetables, fish, lean meats, legumes, fat-free  or low-fat dairy products, and whole grain or high fiber foods. Talk to a dietitian to learn more about heart-healthy foods.  Limit sodium if directed by your health care provider. Sodium restriction may reduce symptoms of heart failure in some people. Talk to a dietitian to learn more about heart-healthy seasonings.  Use healthy cooking methods. Healthy cooking methods include roasting, grilling, broiling, baking, poaching, steaming, or stir-frying. Talk to a dietitian to learn more about healthy cooking methods.  Limit fluids if directed by your health care provider. Fluid restriction may reduce symptoms of heart failure in some people.  Weigh yourself every day. Daily weights are important in the early recognition of excess fluid. You should weigh yourself every morning after you urinate and before you eat breakfast. Wear the same amount of clothing each time you weigh yourself. Record your daily weight. Provide your health care  provider with your weight record.  Monitor and record your blood pressure if directed by your health care provider.  Check your pulse if directed by your health care provider.  Lose weight if directed by your health care provider. Weight loss may reduce symptoms of heart failure in some people.  Stop smoking or chewing tobacco. Nicotine makes your heart work harder by causing your blood vessels to constrict. Do not use nicotine gum or patches before talking to your health care provider.  Keep all follow-up visits as directed by your health care provider. This is important.  Limit alcohol intake to no more than 1 drink per day for nonpregnant women and 2 drinks per day for men. One drink equals 12 ounces of beer, 5 ounces of wine, or 1 ounces of hard liquor. Drinking more than that is harmful to your heart. Tell your health care provider if you drink alcohol several times a week. Talk with your health care provider about whether alcohol is safe for you. If your heart has already been damaged by alcohol or you have severe heart failure, drinking alcohol should be stopped completely.  Stop illicit drug use.  Stay up-to-date with immunizations. It is especially important to prevent respiratory infections through current pneumococcal and influenza immunizations.  Manage other health conditions such as hypertension, diabetes, thyroid disease, or abnormal heart rhythms as directed by your health care provider.  Learn to manage stress.  Plan rest periods when fatigued.  Learn strategies to manage high temperatures. If the weather is extremely hot:  Avoid vigorous physical activity.  Use air conditioning or fans or seek a cooler location.  Avoid caffeine and alcohol.  Wear loose-fitting, lightweight, and light-colored clothing.  Learn strategies to manage cold temperatures. If the weather is extremely cold:  Avoid vigorous physical activity.  Layer clothes.  Wear mittens or gloves, a  hat, and a scarf when going outside.  Avoid alcohol.  Obtain ongoing education and support as needed.  Participate in or seek rehabilitation as needed to maintain or improve independence and quality of life. SEEK MEDICAL CARE IF:   You have a rapid weight gain.  You have increasing shortness of breath that is unusual for you.  You are unable to participate in your usual physical activities.  You tire easily.  You cough more than normal, especially with physical activity.  You have any or more swelling in areas such as your hands, feet, ankles, or abdomen.  You are unable to sleep because it is hard to breathe.  You feel like your heart is beating fast (palpitations).  You become dizzy or light-headed  upon standing up. SEEK IMMEDIATE MEDICAL CARE IF:   You have difficulty breathing.  There is a change in mental status such as decreased alertness or difficulty with concentration.  You have a pain or discomfort in your chest.  You have an episode of fainting (syncope). MAKE SURE YOU:   Understand these instructions.  Will watch your condition.  Will get help right away if you are not doing well or get worse.   This information is not intended to replace advice given to you by your health care provider. Make sure you discuss any questions you have with your health care provider.   Document Released: 07/11/2005 Document Revised: 11/25/2014 Document Reviewed: 08/10/2012 Elsevier Interactive Patient Education [employer]

## 2015-09-25 NOTE — Progress Notes (Signed)
Patient ID: SHACOLA SCHUSSLER, female   DOB: 16-Apr-1941, 75 y.o.   MRN: [id number]    Patient had significant UOP yesterday but not recorded (incontinent). Thinks that breathing may be a bit better today.  Afebrile.   TEE: Normal LV size with mild LV hypertrophy. EF 60%. Normal wall motion. Moderately dilated RV with mildly decreased systolic function. Moderate tricuspid regurgitation. The mitral valve was rheumatic-appearing, moderately calcified with relatively fixed posterior leaflet. Mean gradient 5 mmHg with MVA by PHT 1.9 cm^2. Mild to moderate mitral stenosis with mild mitral regurgitation. The aortic valve was trileaflet and moderately calcified. There was a small 0.8 cm mobile mass on the LV side of the aortic valve. There was mild aortic insufficiency and mild aortic stenosis. There was probably a small PFO (weakly positive bubble study). Moderate biatrial enlargement with no LAA thrombus. Grade III-IV plaque in the descending thoracic aorta.   RHC Procedural Findings: Hemodynamics (mmHg) RA mean 16 RV 65/14 PA 71/28, mean 45 PCWP mean 32 Oxygen saturations: PA 59% AO 95% Cardiac Output (Fick) 4.85  Cardiac Index (Fick) 2.51 PVR 2.68 WU  Scheduled Meds: . [START ON 09/28/2015] alendronate  70 mg Oral Weekly  . calcium-vitamin D  2 tablet Oral Q breakfast  . furosemide  80 mg Intravenous BID  . gabapentin  300 mg Oral BID  . loratadine  10 mg Oral Daily  . metoprolol  50 mg Oral BID  . pantoprazole  40 mg Oral Daily  . potassium chloride SA  20 mEq Oral BID  . rivaroxaban  20 mg Oral QHS  . sertraline  100 mg Oral QHS  . simvastatin  40 mg Oral q1800  . sodium chloride flush  3 mL Intravenous Q12H  . spironolactone  12.5 mg Oral Daily   Continuous Infusions:  PRN Meds:.sodium chloride, acetaminophen, nitroGLYCERIN, ondansetron (ZOFRAN) IV, sodium chloride flush    Filed Vitals:   09/24/15 1924 09/24/15 2158 09/25/15 0327 09/25/15 0800  BP: 134/48  109/41  114/53  Pulse: 35 88 52 78  Temp: 98.1 F (36.7 C)  97.6 F (36.4 C)   TempSrc: Oral  Oral   Resp: 18  17   Height:      Weight:   186 lb 6.4 oz (84.55 kg)   SpO2: 93%  98%     Intake/Output Summary (Last 24 hours) at 09/25/15 0856 Last data filed at 09/25/15 0818  Gross per 24 hour  Intake   1080 ml  Output     50 ml  Net   1030 ml    LABS: Basic Metabolic Panel:  Recent Labs  40/98/11 1605 09/25/15 0415  NA 144 140  K 3.5 3.9  CL 99* 93*  CO2 34* 38*  GLUCOSE 148* 104*  BUN 8 8  CREATININE 0.86 0.84  CALCIUM 9.2 9.1  MG 1.9  --    Liver Function Tests:  Recent Labs  09/24/15 1605  AST 21  ALT 15  ALKPHOS 84  BILITOT 0.5  PROT 7.3  ALBUMIN 3.4*   No results for input(s): LIPASE, AMYLASE in the last 72 hours. CBC:  Recent Labs  09/24/15 1605 09/25/15 0415  WBC 6.3  6.8 8.4  NEUTROABS 4.2 5.3  HGB 10.2*  10.3* 9.0*  HCT 36.0  34.9* 32.3*  MCV 86.7  87.0 87.5  PLT 216  214 205   Cardiac Enzymes: No results for input(s): CKTOTAL, CKMB, CKMBINDEX, TROPONINI in the last 72 hours. BNP: Invalid input(s): POCBNP D-Dimer: No  results for input(s): DDIMER in the last 72 hours. Hemoglobin A1C: No results for input(s): HGBA1C in the last 72 hours. Fasting Lipid Panel: No results for input(s): CHOL, HDL, LDLCALC, TRIG, CHOLHDL, LDLDIRECT in the last 72 hours. Thyroid Function Tests: No results for input(s): TSH, T4TOTAL, T3FREE, THYROIDAB in the last 72 hours.  Invalid input(s): FREET3 Anemia Panel: No results for input(s): VITAMINB12, FOLATE, FERRITIN, TIBC, IRON, RETICCTPCT in the last 72 hours.  RADIOLOGY: Dg Chest 2 View  09/24/2015  CLINICAL DATA:  Acute on chronic shortness of breath, worsening today. EXAM: CHEST  2 VIEW COMPARISON:  10/14/2014 FINDINGS: Cardiomegaly and mile interstitial opacities are identified compatible with mild interstitial edema. There are trace bilateral pleural effusions present. CABG changes are present. There is  no evidence of pneumothorax or acute bony abnormality. IMPRESSION: Cardiomegaly with mild interstitial pulmonary edema and trace bilateral pleural effusions. Electronically Signed   By: Harmon Pier M.D.   On: 09/24/2015 17:34    PHYSICAL EXAM General: NAD Neck: JVP 12-14 cm, no thyromegaly or thyroid nodule.  Lungs: Slight crackles at bases bilaterally. CV: Nondisplaced PMI.  Heart regular S1/S2, no S3/S4, 2/6 HSM LLSB.  1+ ankle edema.  Abdomen: Soft, nontender, no hepatosplenomegaly, no distention.  Neurologic: Alert and oriented x 3.  Psych: Normal affect. Extremities: No clubbing or cyanosis.   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  1. 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 Possible that mitral stenosis plays a role, but appeared only mild to moderate by TEE. 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  She was admitted for diuresis.  Still volume overloaded today.  - Lasix 80 mg IV bid, follow response.    2. Pulmonary hypertension: Primarily pulmonary venous hypertension due to elevated left atrial pressure.  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 Patient recently had tooth pain and was on antibiotics for her teeth. This may be a potential source. Sent blood cultures yesterday and will repeat tomorrow.  Afebrile with WBCs normal.  Seen by ID, recommend holding off on abx for now. 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 By TEE, only appears mild to moderate. Had planned to do right and left heart cath and cross aortic valve to assess MS by cath, but with possible aortic valve vegetation we held off on left heart cath and  crossing valve. Down the road could be valvuloplasty candidate, will need close followup. .  5. COPD: Severe by PFTs. This certainly contributes to her symptoms.  Marca Ancona 09/25/2015 9:02 AM

## 2015-09-25 NOTE — Plan of Care (Signed)
Problem: Education: Goal: Knowledge of [facility] Education information/materials will improve Outcome: Progressing Pt states she is feeling better today in regards to her breathing, clear upper breath sounds diminished in bases.  educated on indication for IV lasix bid, urine output . Pt continues to be afibrile temp 98.3. Per MD will continue to collect blood cultures, diurese and monitor pt closely. Pt has no questions at this time.

## 2015-09-25 NOTE — Progress Notes (Signed)
Patient ID: [name], female   DOB: [birth year], 75 y.o.   MRN: [id number]         [facility]    Date of Admission:  09/24/2015     Principal Problem:   Aortic valve vegetation Active Problems:   Acute on chronic diastolic (congestive) heart failure (HCC)   Acute diastolic (congestive) heart failure (HCC)   . calcium-vitamin D  2 tablet Oral Q breakfast  . furosemide  80 mg Intravenous BID  . gabapentin  300 mg Oral BID  . loratadine  10 mg Oral Daily  . metoprolol  50 mg Oral BID  . pantoprazole  40 mg Oral Daily  . potassium chloride SA  20 mEq Oral BID  . rivaroxaban  20 mg Oral QHS  . sertraline  100 mg Oral QHS  . simvastatin  40 mg Oral q1800  . sodium chloride flush  3 mL Intravenous Q12H  . spironolactone  12.5 mg Oral Daily    SUBJECTIVE: She states that her shortness of breath is a little bit better. She still has some very minor gum soreness. She has not noted any recent fever, chills, sweats or myalgias.  Review of Systems: Review of Systems  Constitutional: Negative for fever, chills, weight loss, malaise/fatigue and diaphoresis.  HENT: Negative for sore throat.   Respiratory: Positive for shortness of breath. Negative for cough and sputum production.   Cardiovascular: Negative for chest pain.  Gastrointestinal: Negative for nausea, vomiting, abdominal pain and diarrhea.  Musculoskeletal: Negative for myalgias and joint pain.  Skin: Negative for rash.  Neurological: Negative for headaches.    Past Medical History  Diagnosis Date  . COPD (chronic obstructive pulmonary disease) (HCC)   . Pulmonary HTN (HCC)     PASP 50-58mmHg by [id number]  . History of rheumatic heart disease     X 3-LAST TIME WHEN PATIENT WAS 75 YRS OLD  . Sleep apnea     intolerant to CPAP  . Hypertension   . Bursitis, shoulder     bilateral  . GERD (gastroesophageal reflux disease)   . H/O hiatal hernia   . Anxiety   . Depression   . Varicose  veins   . PUD (peptic ulcer disease)   . 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 DJD (degenerative joint disease)   . OA (osteoarthritis)   . Hyperlipidemia   . Urinary incontinence   . Pulmonary HTN (HCC)   . Thyroid nodule   . CHF (congestive heart failure) (HCC)     chronic diasotlic CHF  . Complication of anesthesia     "have trouble getting me awake sometimes; been ok latelhy" (09/24/2015)  . On home oxygen therapy     "2L; 24/7" (09/24/2015)    Social History  Substance Use Topics  . Smoking status: Former Smoker -- 1.00 packs/day for 47 years    Types: Cigarettes    Quit date: 07/26/2007  . Smokeless tobacco: Never Used  . Alcohol Use: No    Family History  Problem Relation Age of Onset  . Anemia Mother   . Emphysema Father   . Esophageal cancer Daughter    Allergies  Allergen Reactions  . Prednisone Rash    jittery  . Xopenex [Levalbuterol]     Made mouth and throat sore  . Cefpodoxime Other (See Comments)    Yeast infections   . Clindamycin Other (See Comments)    Unknown   . Levalbuterol  Hcl Other (See Comments)    Unknown   . Lipitor [Atorvastatin]     Other reaction(s): Other (See Comments) Made joint start hurting  Muscle aches  . Other Swelling    Venholin HFA  . Penicillins Other (See Comments)    Unknown, childhood reaction   . Tape Other (See Comments)    Bleeding  . Ventolin [Kdc:Albuterol]     "jittery"  . Codeine Other (See Comments)    anxious jittery  . Hydrocodone-Acetaminophen Anxiety  . Latex Itching and Rash    OBJECTIVE: Filed Vitals:   09/24/15 1924 09/24/15 2158 09/25/15 0327 09/25/15 0800  BP: 134/48  109/41 114/53  Pulse: 35 88 52 78  Temp: 98.1 F (36.7 C)  97.6 F (36.4 C)   TempSrc: Oral  Oral   Resp: 18  17   Height:      Weight:   186 lb 6.4 oz (84.55 kg)   SpO2: 93%  98%    Body mass index is 28.35 kg/(m^2).  Physical Exam  Constitutional: She is oriented to person, place, and time.  She is smiling  and in good spirits.  HENT:  Mouth/Throat: No oropharyngeal exudate.  Teeth are in poor condition.  Eyes: Conjunctivae are normal.  Cardiovascular: Normal rate and regular rhythm.   Murmur heard. No change in 2/6 systolic murmur.  Pulmonary/Chest: She has rales.  Neurological: She is alert and oriented to person, place, and time.  Skin: No rash noted.  Psychiatric: Mood and affect normal.    Lab Results Lab Results  Component Value Date   WBC 8.4 09/25/2015   HGB 9.0* 09/25/2015   HCT 32.3* 09/25/2015   MCV 87.5 09/25/2015   PLT 205 09/25/2015    Lab Results  Component Value Date   CREATININE 0.84 09/25/2015   BUN 8 09/25/2015   NA 140 09/25/2015   K 3.9 09/25/2015   CL 93* 09/25/2015   CO2 38* 09/25/2015    Lab Results  Component Value Date   ALT 15 09/24/2015   AST 21 09/24/2015   ALKPHOS 84 09/24/2015   BILITOT 0.5 09/24/2015     Microbiology: Recent Results (from the past 240 hour(s))  Culture, blood (routine x 2)     Status: None (Preliminary result)   Collection Time: 09/24/15  3:55 PM  Result Value Ref Range Status   Specimen Description BLOOD RIGHT ARM  Final   Special Requests BOTTLES DRAWN AEROBIC ONLY 1.6ML  Final   Culture NO GROWTH < 24 HOURS  Final   Report Status PENDING  Incomplete  Culture, blood (routine x 2)     Status: None (Preliminary result)   Collection Time: 09/24/15  4:03 PM  Result Value Ref Range Status   Specimen Description BLOOD LEFT HAND  Final   Special Requests BOTTLES DRAWN AEROBIC ONLY  Final   Culture NO GROWTH < 24 HOURS  Final   Report Status PENDING  Incomplete     ASSESSMENT: It is hard to know what to make of the small mobile mass seen on her aortic valve during her TEE yesterday. Blood cultures are negative at less than 24 hours. She has no current clinical evidence to suggest active endocarditis but we will need to monitor her clinical course and serial blood cultures closely. There is no current indication  for starting empiric antibiotic therapy.  PLAN: 1. Continue observation off of antibiotics 2. My partner, Dr. Merceda Elks [phone]), will monitor her temperature curve and blood cultures this  weekend. Please call him for any infectious disease questions.  Cliffton Asters, MD [facility] [phone] pager   [phone] cell 09/25/2015, 12:12 PM

## 2015-09-25 NOTE — Progress Notes (Signed)
Patient ID: [name], female   DOB: 02-05-1941, 75 y.o.   MRN: [id number]         [facility]    Date of Admission:  09/24/2015            1 of 2 admission blood cultures drawn yesterday are growing gram-positive cocci in clusters. This was called me after my visit with [name] this morning. I checked on her again and she remains clinically stable without clinical evidence of infection. I will repeat another set of blood cultures now but continue to watch her carefully off of antibiotics since this may represent an insignificant contaminant. I would prefer not to start antibiotics now so as not to impact the utility of repeat cultures. If she were to develop fever or show other signs of clinical instability I would have a low threshold for starting her on vancomycin and cefazolin. Dr. Merceda Elks [phone]) we'll follow with you this weekend.  Cliffton Asters, MD [facility] [phone] pager   [phone] cell 07/28/2015, 1:32 PM

## 2015-09-25 NOTE — Progress Notes (Signed)
UR Completed Elmyra Banwart Graves-Bigelow, RN,BSN [phone]  

## 2015-09-25 NOTE — Progress Notes (Signed)
PHARMACIST - PHYSICIAN COMMUNICATION  CONCERNING: P&T Medication Policy Regarding Oral Bisphosphonates  RECOMMENDATION: Your order for alendronate (Fosamax), ibandronate (Boniva), or risedronate (Actonel) has been discontinued at this time.  If the patient's post-hospital medical condition warrants safe use of this class of drugs, please resume the pre-hospital regimen upon discharge.  DESCRIPTION:  Alendronate (Fosamax), ibandronate (Boniva), and risedronate (Actonel) can cause severe esophageal erosions in patients who are unable to remain upright at least 30 minutes after taking this medication.   Since brief interruptions in therapy are thought to have minimal impact on bone mineral density, the Pharmacy & Therapeutics Committee has established that bisphosphonate orders should be routinely discontinued during hospitalization.   To override this safety policy and permit administration of Boniva, Fosamax, or Actonel in the hospital, prescribers must write "DO NOT HOLD" in the comments section when placing the order for this class of medications.   Sheppard Coil PharmD., BCPS Clinical Pharmacist Pager [phone] 09/25/2015 10:17 AM

## 2015-09-25 NOTE — Progress Notes (Signed)
CRITICAL VALUE ALERT Critical value received:  Blood cultures gram + cocci  Date of notification:  09/25/15  Time of notification:  13:07  Critical value read back:Yes.    Nurse who received alert:  Kela Millin  MD notified (1st page):  Dr. Wess Botts  Time of first page:  1:16  Time of second page:13:17

## 2015-09-26 DIAGNOSIS — E876 Hypokalemia: Secondary | ICD-10-CM

## 2015-09-26 DIAGNOSIS — I339 Acute and subacute endocarditis, unspecified: Secondary | ICD-10-CM

## 2015-09-26 DIAGNOSIS — R7881 Bacteremia: Secondary | ICD-10-CM

## 2015-09-26 LAB — CBC WITH DIFFERENTIAL/PLATELET
BASOS ABS: 0 10*3/uL (ref 0.0–0.1)
BASOS PCT: 0 %
EOS ABS: 0.3 10*3/uL (ref 0.0–0.7)
EOS PCT: 3 %
HCT: 32.7 % — ABNORMAL LOW (ref 36.0–46.0)
HEMOGLOBIN: 9.2 g/dL — AB (ref 12.0–15.0)
Lymphocytes Relative: 17 %
Lymphs Abs: 1.4 10*3/uL (ref 0.7–4.0)
MCH: 24.6 pg — AB (ref 26.0–34.0)
MCHC: 28.1 g/dL — ABNORMAL LOW (ref 30.0–36.0)
MCV: 87.4 fL (ref 78.0–100.0)
Monocytes Absolute: 0.9 10*3/uL (ref 0.1–1.0)
Monocytes Relative: 11 %
NEUTROS PCT: 69 %
Neutro Abs: 5.5 10*3/uL (ref 1.7–7.7)
PLATELETS: 200 10*3/uL (ref 150–400)
RBC: 3.74 MIL/uL — AB (ref 3.87–5.11)
RDW: 14.5 % (ref 11.5–15.5)
WBC: 8 10*3/uL (ref 4.0–10.5)

## 2015-09-26 LAB — BASIC METABOLIC PANEL
Anion gap: 9 (ref 5–15)
BUN: 11 mg/dL (ref 6–20)
CALCIUM: 9.3 mg/dL (ref 8.9–10.3)
CHLORIDE: 95 mmol/L — AB (ref 101–111)
CO2: 38 mmol/L — ABNORMAL HIGH (ref 22–32)
CREATININE: 0.82 mg/dL (ref 0.44–1.00)
Glucose, Bld: 112 mg/dL — ABNORMAL HIGH (ref 65–99)
Potassium: 3.6 mmol/L (ref 3.5–5.1)
SODIUM: 142 mmol/L (ref 135–145)

## 2015-09-26 MED ORDER — SPIRONOLACTONE 25 MG PO TABS
25.0000 mg | ORAL_TABLET | Freq: Every day | ORAL | Status: DC
  Administered 2015-09-27 – 2015-09-30 (×4): 25 mg via ORAL
  Filled 2015-09-26 (×4): qty 1

## 2015-09-26 MED ORDER — POTASSIUM CHLORIDE CRYS ER 20 MEQ PO TBCR
40.0000 meq | EXTENDED_RELEASE_TABLET | Freq: Once | ORAL | Status: AC
  Administered 2015-09-26: 40 meq via ORAL
  Filled 2015-09-26: qty 2

## 2015-09-26 NOTE — Progress Notes (Signed)
Patient ID: [name], female   DOB: November 04, 1940, 75 y.o.   MRN: [id number]  [facility] ROUNDING NOTE  Subjective:  Still SOB with minimal activity. Says she feels about the same. No f/c. Weight down 3 pounds.    TEE: Normal LV size with mild LV hypertrophy. EF 60%. Normal wall motion. Moderately dilated RV with mildly decreased systolic function. Moderate tricuspid regurgitation. The mitral valve was rheumatic-appearing, moderately calcified with relatively fixed posterior leaflet. Mean gradient 5 mmHg with MVA by PHT 1.9 cm^2. Mild to moderate mitral stenosis with mild mitral regurgitation. The aortic valve was trileaflet and moderately calcified. There was a small 0.8 cm mobile mass on the LV side of the aortic valve. There was mild aortic insufficiency and mild aortic stenosis. There was probably a small PFO (weakly positive bubble study). Moderate biatrial enlargement with no LAA thrombus. Grade III-IV plaque in the descending thoracic aorta.   RHC Procedural Findings: Hemodynamics (mmHg) RA mean 16 RV 65/14 PA 71/28, mean 45 PCWP mean 32 Oxygen saturations: PA 59% AO 95% Cardiac Output (Fick) 4.85  Cardiac Index (Fick) 2.51 PVR 2.68 WU  Scheduled Meds: . calcium-vitamin D  2 tablet Oral Q breakfast  . furosemide  80 mg Intravenous BID  . gabapentin  300 mg Oral BID  . loratadine  10 mg Oral Daily  . metoprolol  50 mg Oral BID  . pantoprazole  40 mg Oral Daily  . potassium chloride SA  20 mEq Oral BID  . rivaroxaban  20 mg Oral QHS  . sertraline  100 mg Oral QHS  . simvastatin  40 mg Oral q1800  . sodium chloride flush  3 mL Intravenous Q12H  . spironolactone  12.5 mg Oral Daily   Continuous Infusions:  PRN Meds:.sodium chloride, acetaminophen, nitroGLYCERIN, ondansetron (ZOFRAN) IV, sodium chloride flush    Filed Vitals:   09/25/15 1354 09/25/15 2113 09/26/15 0541 09/26/15 0605  BP: 123/64 138/88 118/51   Pulse: 75 83 68   Temp: 98.3 F (36.8  C) 97.5 F (36.4 C) 98.7 F (37.1 C)   TempSrc: Oral Oral Oral   Resp: 17 18 16    Height:      Weight:    83.416 kg (183 lb 14.4 oz)  SpO2: 100% 97% 95%     Intake/Output Summary (Last 24 hours) at 09/26/15 1229 Last data filed at 09/26/15 0600  Gross per 24 hour  Intake    450 ml  Output   1750 ml  Net  -1300 ml    LABS: Basic Metabolic Panel:  Recent Labs  75/88/32 1605 09/25/15 0415 09/26/15 0524  NA 144 140 142  K 3.5 3.9 3.6  CL 99* 93* 95*  CO2 34* 38* 38*  GLUCOSE 148* 104* 112*  BUN 8 8 11   CREATININE 0.86 0.84 0.82  CALCIUM 9.2 9.1 9.3  MG 1.9  --   --    Liver Function Tests:  Recent Labs  09/24/15 1605  AST 21  ALT 15  ALKPHOS 84  BILITOT 0.5  PROT 7.3  ALBUMIN 3.4*   No results for input(s): LIPASE, AMYLASE in the last 72 hours. CBC:  Recent Labs  09/25/15 0415 09/26/15 0524  WBC 8.4 8.0  NEUTROABS 5.3 5.5  HGB 9.0* 9.2*  HCT 32.3* 32.7*  MCV 87.5 87.4  PLT 205 200   Cardiac Enzymes: No results for input(s): CKTOTAL, CKMB, CKMBINDEX, TROPONINI in the last 72 hours. BNP: Invalid input(s): POCBNP D-Dimer: No results for input(s):  DDIMER in the last 72 hours. Hemoglobin A1C: No results for input(s): HGBA1C in the last 72 hours. Fasting Lipid Panel: No results for input(s): CHOL, HDL, LDLCALC, TRIG, CHOLHDL, LDLDIRECT in the last 72 hours. Thyroid Function Tests: No results for input(s): TSH, T4TOTAL, T3FREE, THYROIDAB in the last 72 hours.  Invalid input(s): FREET3 Anemia Panel: No results for input(s): VITAMINB12, FOLATE, FERRITIN, TIBC, IRON, RETICCTPCT in the last 72 hours.  RADIOLOGY: Dg Chest 2 View  09/24/2015  CLINICAL DATA:  Acute on chronic shortness of breath, worsening today. EXAM: CHEST  2 VIEW COMPARISON:  10/14/2014 FINDINGS: Cardiomegaly and mile interstitial opacities are identified compatible with mild interstitial edema. There are trace bilateral pleural effusions present. CABG changes are present. There is no  evidence of pneumothorax or acute bony abnormality. IMPRESSION: Cardiomegaly with mild interstitial pulmonary edema and trace bilateral pleural effusions. Electronically Signed   By: Harmon Pier M.D.   On: 09/24/2015 17:34    PHYSICAL EXAM General: NAD Neck: JVP 8-9 cm, no thyromegaly or thyroid nodule.  Lungs: Slight crackles at bases bilaterally. CV: Nondisplaced PMI.  Heart regular with ectopy S1/S2, no S3/S4, 2/6 HSM LLSB.  1+ ankle edema.  Abdomen: Soft, nontender, no hepatosplenomegaly, no distention.  Neurologic: Alert and oriented x 3.  Psych: Normal affect. Extremities: No clubbing or cyanosis.   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  1. Acute on chronic diastolic CHF with RV failure: Admitted with marved volume overload. PCWP 32. Now improving. Possible that mitral stenosis plays a role, but appeared only mild to moderate by TEE. 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  -Still volume overloaded.  -Continue Lasix 80 mg IV bid, follow response.   can use metoalzone as needed.  -Placed TED hose -Supp K+ for hypokaleami 2. Pulmonary hypertension: Primarily pulmonary venous hypertension due to elevated left atrial pressure.  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 Patient recently had tooth pain and was on antibiotics for her teeth. This may be a potential source. Afebrile with WBCs normal.  Seen by ID, recommend holding off on abx for now. BCX remain NGTD. 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 By TEE, only appears mild to moderate. Had planned to do right and left heart cath and cross aortic valve to assess MS by cath, but with possible aortic valve  vegetation we held off on left heart cath and crossing valve. Down the road could be valvuloplasty candidate, will need close followup. .  5. COPD: Severe by PFTs. This certainly contributes to her symptoms. 6. Deconditioning, severe -Consult PT and CR.   Arvilla Meres  MD  09/26/2015 12:29 PM

## 2015-09-26 NOTE — Progress Notes (Signed)
CARDIAC REHAB PHASE I   PRE:  Rate/Rhythm: 68 SR  BP:  Supine:   Sitting: 122/54  Standing:    SaO2: 93 2L  MODE:  Ambulation: 150 ft   POST:  Rate/Rhythm: 80 SR  BP:  Supine:   Sitting: 110/50  Standing:    SaO2: 92 2l Tolerated ambulation very well with rolling walker, assistance x 2, and 2 L of oxygen.  Can be assistance x 1, encouraged to walk with nursing staff later this evening.  I did not notice SOB.  Placed in recliner after walking with phone and call bell within reach. [phone]  Cindra Eves RN, BSN 09/26/2015 2:30 PM

## 2015-09-26 NOTE — Progress Notes (Signed)
    [facility]   Reason for visit: Follow up on mobile mass on aortic valve  Interval History: remains afebrile, WBC wnl.  Blood culture 1/2 CoNS.    Physical Exam: Constitutional:  Filed Vitals:   09/25/15 2113 09/26/15 0541  BP: 138/88 118/51  Pulse: 83 68  Temp: 97.5 F (36.4 C) 98.7 F (37.1 C)  Resp: 18 16   patient appears in NAD, does not answer much or open eyes with exam Respiratory: Normal respiratory effort; CTA B Cardiovascular: RRR with SEM, 2/6  Review of Systems: Constitutional: negative for fevers and chills Cardiovascular: negative for chest pain Gastrointestinal: negative for diarrhea  Lab Results  Component Value Date   WBC 8.0 09/26/2015   HGB 9.2* 09/26/2015   HCT 32.7* 09/26/2015   MCV 87.4 09/26/2015   PLT 200 09/26/2015    Lab Results  Component Value Date   CREATININE 0.82 09/26/2015   BUN 11 09/26/2015   NA 142 09/26/2015   K 3.6 09/26/2015   CL 95* 09/26/2015   CO2 38* 09/26/2015    Lab Results  Component Value Date   ALT 15 09/24/2015   AST 21 09/24/2015   ALKPHOS 84 09/24/2015     Microbiology: Recent Results (from the past 240 hour(s))  Culture, blood (routine x 2)     Status: None (Preliminary result)   Collection Time: 09/24/15  3:55 PM  Result Value Ref Range Status   Specimen Description BLOOD RIGHT ARM  Final   Special Requests BOTTLES DRAWN AEROBIC ONLY 1.6ML  Final   Culture  Setup Time   Final    GRAM POSITIVE COCCI IN CLUSTERS IN PEDIATRIC BOTTLE CRITICAL RESULT CALLED TO, READ BACK BY AND VERIFIED WITH: J ODDONO,RN AT 1306 09/25/15 BY L BENFIELD    Culture STAPHYLOCOCCUS SPECIES (COAGULASE NEGATIVE)  Final   Report Status PENDING  Incomplete  Culture, blood (routine x 2)     Status: None (Preliminary result)   Collection Time: 09/24/15  4:03 PM  Result Value Ref Range Status   Specimen Description BLOOD LEFT HAND  Final   Special Requests BOTTLES DRAWN AEROBIC ONLY  Final   Culture NO GROWTH 2 DAYS  Final   Report Status PENDING  Incomplete    Impression/Plan:  1. Mobile vegetation - at this point, it does not appear consistent with infection with no systemic signs.  1/2 blood culuture with CoNS is most consistent with contaminate and does not warrant treatment at this time.  2. Sob - no current sob.

## 2015-09-27 LAB — CBC WITH DIFFERENTIAL/PLATELET
BASOS PCT: 1 %
Basophils Absolute: 0 10*3/uL (ref 0.0–0.1)
EOS PCT: 5 %
Eosinophils Absolute: 0.4 10*3/uL (ref 0.0–0.7)
HCT: 33.1 % — ABNORMAL LOW (ref 36.0–46.0)
Hemoglobin: 9.2 g/dL — ABNORMAL LOW (ref 12.0–15.0)
Lymphocytes Relative: 18 %
Lymphs Abs: 1.4 10*3/uL (ref 0.7–4.0)
MCH: 24.5 pg — ABNORMAL LOW (ref 26.0–34.0)
MCHC: 27.8 g/dL — ABNORMAL LOW (ref 30.0–36.0)
MCV: 88 fL (ref 78.0–100.0)
MONO ABS: 0.9 10*3/uL (ref 0.1–1.0)
Monocytes Relative: 11 %
Neutro Abs: 5.1 10*3/uL (ref 1.7–7.7)
Neutrophils Relative %: 65 %
PLATELETS: 198 10*3/uL (ref 150–400)
RBC: 3.76 MIL/uL — ABNORMAL LOW (ref 3.87–5.11)
RDW: 14.5 % (ref 11.5–15.5)
WBC: 7.7 10*3/uL (ref 4.0–10.5)

## 2015-09-27 LAB — BASIC METABOLIC PANEL
Anion gap: 10 (ref 5–15)
BUN: 14 mg/dL (ref 6–20)
CALCIUM: 9.1 mg/dL (ref 8.9–10.3)
CO2: 37 mmol/L — ABNORMAL HIGH (ref 22–32)
Chloride: 93 mmol/L — ABNORMAL LOW (ref 101–111)
Creatinine, Ser: 0.89 mg/dL (ref 0.44–1.00)
GFR calc Af Amer: >60 mL/min (ref >60)
GLUCOSE: 104 mg/dL — AB (ref 65–99)
Potassium: 4.4 mmol/L (ref 3.5–5.1)
Sodium: 140 mmol/L (ref 135–145)

## 2015-09-27 LAB — CULTURE, BLOOD (ROUTINE X 2)

## 2015-09-27 MED ORDER — FUROSEMIDE 10 MG/ML IJ SOLN
80.0000 mg | Freq: Three times a day (TID) | INTRAMUSCULAR | Status: DC
  Administered 2015-09-27 – 2015-09-29 (×5): 80 mg via INTRAVENOUS
  Filled 2015-09-27 (×5): qty 8

## 2015-09-27 MED ORDER — POTASSIUM CHLORIDE CRYS ER 20 MEQ PO TBCR
40.0000 meq | EXTENDED_RELEASE_TABLET | Freq: Once | ORAL | Status: AC
  Administered 2015-09-27: 40 meq via ORAL
  Filled 2015-09-27: qty 2

## 2015-09-27 MED ORDER — METOLAZONE 5 MG PO TABS
5.0000 mg | ORAL_TABLET | Freq: Once | ORAL | Status: AC
  Administered 2015-09-27: 5 mg via ORAL
  Filled 2015-09-27: qty 1

## 2015-09-27 NOTE — Progress Notes (Signed)
Patient ID: [name], female   DOB: 22-May-1941, 75 y.o.   MRN: [id number]  [facility] ROUNDING NOTE  Subjective:  Still SOB with minimal activity. Poor diuresis yesterday.  Creatinine stable.    TEE: Normal LV size with mild LV hypertrophy. EF 60%. Normal wall motion. Moderately dilated RV with mildly decreased systolic function. Moderate tricuspid regurgitation. The mitral valve was rheumatic-appearing, moderately calcified with relatively fixed posterior leaflet. Mean gradient 5 mmHg with MVA by PHT 1.9 cm^2. Mild to moderate mitral stenosis with mild mitral regurgitation. The aortic valve was trileaflet and moderately calcified. There was a small 0.8 cm mobile mass on the LV side of the aortic valve. There was mild aortic insufficiency and mild aortic stenosis. There was probably a small PFO (weakly positive bubble study). Moderate biatrial enlargement with no LAA thrombus. Grade III-IV plaque in the descending thoracic aorta.   RHC Procedural Findings: Hemodynamics (mmHg) RA mean 16 RV 65/14 PA 71/28, mean 45 PCWP mean 32 Oxygen saturations: PA 59% AO 95% Cardiac Output (Fick) 4.85  Cardiac Index (Fick) 2.51 PVR 2.68 WU  Scheduled Meds: . calcium-vitamin D  2 tablet Oral Q breakfast  . furosemide  80 mg Intravenous 3 times per day  . gabapentin  300 mg Oral BID  . loratadine  10 mg Oral Daily  . metolazone  5 mg Oral Once  . metoprolol  50 mg Oral BID  . pantoprazole  40 mg Oral Daily  . potassium chloride SA  20 mEq Oral BID  . potassium chloride  40 mEq Oral Once  . rivaroxaban  20 mg Oral QHS  . sertraline  100 mg Oral QHS  . simvastatin  40 mg Oral q1800  . sodium chloride flush  3 mL Intravenous Q12H  . spironolactone  25 mg Oral Daily   Continuous Infusions:  PRN Meds:.sodium chloride, acetaminophen, nitroGLYCERIN, ondansetron (ZOFRAN) IV, sodium chloride flush    Filed Vitals:   09/26/15 2156 09/27/15 0448 09/27/15 0822 09/27/15 0847    BP: 120/42 151/51 109/48 128/53  Pulse: 84 51  92  Temp: 98.3 F (36.8 C) 98.5 F (36.9 C)    TempSrc: Oral Oral    Resp: 18 16    Height:      Weight:  184 lb 1.6 oz (83.507 kg)    SpO2: 95% 97% 90%     Intake/Output Summary (Last 24 hours) at 09/27/15 0905 Last data filed at 09/27/15 0815  Gross per 24 hour  Intake   1200 ml  Output    550 ml  Net    650 ml    LABS: Basic Metabolic Panel:  Recent Labs  40/98/11 1605  09/26/15 0524 09/27/15 0601  NA 144  < > 142 140  K 3.5  < > 3.6 4.4  CL 99*  < > 95* 93*  CO2 34*  < > 38* 37*  GLUCOSE 148*  < > 112* 104*  BUN 8  < > 11 14  CREATININE 0.86  < > 0.82 0.89  CALCIUM 9.2  < > 9.3 9.1  MG 1.9  --   --   --   < > = values in this interval not displayed. Liver Function Tests:  Recent Labs  09/24/15 1605  AST 21  ALT 15  ALKPHOS 84  BILITOT 0.5  PROT 7.3  ALBUMIN 3.4*   No results for input(s): LIPASE, AMYLASE in the last 72 hours. CBC:  Recent Labs  09/26/15 0524 09/27/15 0601  WBC 8.0 7.7  NEUTROABS 5.5 5.1  HGB 9.2* 9.2*  HCT 32.7* 33.1*  MCV 87.4 88.0  PLT 200 198   Cardiac Enzymes: No results for input(s): CKTOTAL, CKMB, CKMBINDEX, TROPONINI in the last 72 hours. BNP: Invalid input(s): POCBNP D-Dimer: No results for input(s): DDIMER in the last 72 hours. Hemoglobin A1C: No results for input(s): HGBA1C in the last 72 hours. Fasting Lipid Panel: No results for input(s): CHOL, HDL, LDLCALC, TRIG, CHOLHDL, LDLDIRECT in the last 72 hours. Thyroid Function Tests: No results for input(s): TSH, T4TOTAL, T3FREE, THYROIDAB in the last 72 hours.  Invalid input(s): FREET3 Anemia Panel: No results for input(s): VITAMINB12, FOLATE, FERRITIN, TIBC, IRON, RETICCTPCT in the last 72 hours.  RADIOLOGY: Dg Chest 2 View  09/24/2015  CLINICAL DATA:  Acute on chronic shortness of breath, worsening today. EXAM: CHEST  2 VIEW COMPARISON:  10/14/2014 FINDINGS: Cardiomegaly and mile interstitial opacities are  identified compatible with mild interstitial edema. There are trace bilateral pleural effusions present. CABG changes are present. There is no evidence of pneumothorax or acute bony abnormality. IMPRESSION: Cardiomegaly with mild interstitial pulmonary edema and trace bilateral pleural effusions. Electronically Signed   By: Harmon Pier M.D.   On: 09/24/2015 17:34    PHYSICAL EXAM General: NAD Neck: JVP 12 cm, no thyromegaly or thyroid nodule.  Lungs: Slight crackles at bases bilaterally. CV: Nondisplaced PMI.  Heart regular with ectopy S1/S2, no S3/S4, 2/6 HSM LLSB.  1+ ankle edema.  Abdomen: Soft, nontender, no hepatosplenomegaly, no distention.  Neurologic: Alert and oriented x 3.  Psych: Normal affect. Extremities: No clubbing or cyanosis.   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  1. 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 PCWP 32. Possible that mitral stenosis plays a role, but appeared only mild to moderate by TEE. 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 Still volume overloaded, has not diuresed particularly well.  - Lasix 80 mg IV every 8 hrs today and add metolazone 5 mg po x 1.   - Follow K, creatinine.  Creatinine so far stable. 2. Pulmonary hypertension: Primarily pulmonary venous hypertension due to elevated left atrial pressure.  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 Patient recently had tooth pain and was on antibiotics for her teeth. This may be a potential source. Afebrile with WBCs normal.  She had 1/2 Coag negative Staph, suspect contaminant.  Seen by ID, recommend holding off on abx for now.  4. Mitral  stenosis: Suspect rheumatic. By TEE, only appears mild to moderate. Had planned to do right and left heart cath and cross aortic valve to assess MS by cath, but with possible aortic valve vegetation we held off on left heart cath and crossing valve. Down the road could be valvuloplasty candidate, will need close followup.   5. COPD: Severe by PFTs. This certainly contributes to her symptoms. 6. Deconditioning, severe - Work with PT and CR.   Marca Ancona  MD  09/27/2015 9:05 AM

## 2015-09-27 NOTE — Evaluation (Signed)
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   Clinical Impression   Pt admitted with above diagnosis. Pt currently with functional limitations due to the deficits listed below (see PT Problem List).  Pt will benefit from skilled PT to increase their independence and safety with mobility to allow discharge to the venue listed below.       Follow Up Recommendations Home health PT;Other (comment) (Also HHRN for chronic disease management)    Equipment Recommendations  3in1 (PT)    Recommendations for Other Services OT consult (Energy Conservation)     Precautions / Restrictions Precautions Precautions: Fall (noting weakness with onset of fatigue)      Mobility  Bed Mobility                  Transfers Overall transfer level: Needs assistance Equipment used: Rolling walker (2 wheeled) Transfers: Sit to/from Stand Sit to Stand: Min guard;Min assist         General transfer comment: Minguard standing from bed at beginning of session; min assist to steady with stand from hallway chair after seated rest break in hallway  Ambulation/Gait Ambulation/Gait assistance: Min guard Ambulation Distance (Feet): 150 Feet (One seated rest break) Assistive device: Rolling walker (2 wheeled) Gait Pattern/deviations: Step-through pattern (noting slight bil UE tremor, L worse than R, with fatigue)   Gait velocity interpretation: Below normal speed for age/gender General Gait Details: Cues to self-monitor for activity tolerance  Stairs            Wheelchair Mobility    Modified Rankin (Stroke Patients Only)       Balance                                              Pertinent Vitals/Pain Pain Assessment: No/denies pain    Home Living Family/patient expects to be discharged to:: Private residence Living Arrangements: Spouse/significant other;Children Available Help at Discharge: Family;Available PRN/intermittently Type of Home: House Home Access: Ramped entrance     Home Layout: One level Home Equipment: Walker - 4 wheels (oxygen)      Prior Function Level of Independence: Independent with assistive device(s)         Comments: On home O2 2L, uses Rollator RW; lately very inactve, according to son, here recently would go days at a time without getting OOB except for the bathroom     Hand Dominance        Extremity/Trunk Assessment   Upper Extremity Assessment: Overall WFL for tasks assessed           Lower Extremity Assessment: Generalized weakness (weakness onset with fatigue)         Communication   Communication: No difficulties  Cognition Arousal/Alertness: Awake/alert Behavior During Therapy: WFL for tasks assessed/performed Overall Cognitive Status: Within Functional Limits for tasks assessed                      General Comments General comments (skin integrity, edema, etc.): Session conducted on 2 L O2; Sats ranged 88-97%    Exercises        Assessment/Plan  PT Assessment Patient needs continued PT services  PT Diagnosis Difficulty walking;Generalized weakness   PT Problem List Decreased strength;Decreased activity tolerance;Decreased balance;Decreased mobility;Decreased coordination;Decreased knowledge of use of DME;Cardiopulmonary status limiting activity  PT Treatment Interventions DME instruction;Gait training;Functional mobility training;Therapeutic activities;Therapeutic exercise;Balance training;Patient/family education   PT Goals (Current goals can be found in the Care Plan section) Acute Rehab PT Goals Patient Stated Goal: wants to feel better PT Goal Formulation: With  patient Time For Goal Achievement: 10/11/15 Potential to Achieve Goals: Good    Frequency Min 3X/week   Barriers to discharge        Co-evaluation               End of Session Equipment Utilized During Treatment: Oxygen Activity Tolerance: Patient tolerated treatment well;Patient limited by fatigue Patient left: with call bell/phone within reach;with family/visitor present (On BSC) Nurse Communication: Mobility status         Time: 6213-0865 PT Time Calculation (min) (ACUTE ONLY): 31 min   Charges:   PT Evaluation $PT Eval Moderate Complexity: 1 Procedure PT Treatments $Gait Training: 8-22 mins   PT G CodesVan Clines Hamff 09/27/2015, 9:03 AM  Van Clines, PT  Acute Rehabilitation Services Pager [phone] Office [phone]

## 2015-09-27 NOTE — Progress Notes (Signed)
    [facility]   Reason for visit: Follow up on mobile mass on aortic valve  Interval History: remains afebrile, WBC wnl.  Blood culture 1/2 CoNS.    Physical Exam: Constitutional:  Filed Vitals:   09/27/15 0822 09/27/15 0847  BP: 109/48 128/53  Pulse:  92  Temp:    Resp:     patient appears in NAD, more alert today Respiratory: Normal respiratory effort; CTA B Cardiovascular: RRR with SEM, 2/6  Review of Systems: Constitutional: negative for fevers and chills Cardiovascular: negative for chest pain Gastrointestinal: negative for diarrhea  Lab Results  Component Value Date   WBC 7.7 09/27/2015   HGB 9.2* 09/27/2015   HCT 33.1* 09/27/2015   MCV 88.0 09/27/2015   PLT 198 09/27/2015    Lab Results  Component Value Date   CREATININE 0.89 09/27/2015   BUN 14 09/27/2015   NA 140 09/27/2015   K 4.4 09/27/2015   CL 93* 09/27/2015   CO2 37* 09/27/2015    Lab Results  Component Value Date   ALT 15 09/24/2015   AST 21 09/24/2015   ALKPHOS 84 09/24/2015     Microbiology: Recent Results (from the past 240 hour(s))  Culture, blood (routine x 2)     Status: None   Collection Time: 09/24/15  3:55 PM  Result Value Ref Range Status   Specimen Description BLOOD RIGHT ARM  Final   Special Requests BOTTLES DRAWN AEROBIC ONLY 1.6ML  Final   Culture  Setup Time   Final    GRAM POSITIVE COCCI IN CLUSTERS IN PEDIATRIC BOTTLE CRITICAL RESULT CALLED TO, READ BACK BY AND VERIFIED WITH: J ODDONO,RN AT 1306 09/25/15 BY L BENFIELD    Culture   Final    STAPHYLOCOCCUS SPECIES (COAGULASE NEGATIVE) THE SIGNIFICANCE OF ISOLATING THIS ORGANISM FROM A SINGLE SET OF BLOOD CULTURES WHEN MULTIPLE SETS ARE DRAWN IS UNCERTAIN. PLEASE NOTIFY THE MICROBIOLOGY DEPARTMENT WITHIN ONE WEEK IF SPECIATION AND SENSITIVITIES ARE REQUIRED.    Report Status 09/27/2015 FINAL  Final  Culture, blood (routine x 2)     Status: None (Preliminary result)   Collection Time: 09/24/15   4:03 PM  Result Value Ref Range Status   Specimen Description BLOOD LEFT HAND  Final   Special Requests BOTTLES DRAWN AEROBIC ONLY  Final   Culture NO GROWTH 2 DAYS  Final   Report Status PENDING  Incomplete    Impression/Plan:  1. Mobile vegetation - at this point, it does not appear consistent with infection with no systemic signs.  1/2 blood culuture with CoNS is most consistent with contaminate and does not warrant treatment at this time.  2. Sob - feels much improved.

## 2015-09-28 LAB — BASIC METABOLIC PANEL
ANION GAP: 15 (ref 5–15)
ANION GAP: 13 (ref 5–15)
BUN: 20 mg/dL (ref 6–20)
BUN: 23 mg/dL — AB (ref 6–20)
CALCIUM: 9.6 mg/dL (ref 8.9–10.3)
CALCIUM: 9.9 mg/dL (ref 8.9–10.3)
CHLORIDE: 81 mmol/L — AB (ref 101–111)
CO2: 45 mmol/L — AB (ref 22–32)
CO2: 45 mmol/L — ABNORMAL HIGH (ref 22–32)
Chloride: 83 mmol/L — ABNORMAL LOW (ref 101–111)
Creatinine, Ser: 0.99 mg/dL (ref 0.44–1.00)
Creatinine, Ser: 1.09 mg/dL — ABNORMAL HIGH (ref 0.44–1.00)
GFR calc Af Amer: 56 mL/min — ABNORMAL LOW (ref >60)
GFR calc non Af Amer: 54 mL/min — ABNORMAL LOW (ref >60)
GFR, EST NON AFRICAN AMERICAN: 48 mL/min — AB (ref >60)
GLUCOSE: 142 mg/dL — AB (ref 65–99)
Glucose, Bld: 117 mg/dL — ABNORMAL HIGH (ref 65–99)
POTASSIUM: 3.4 mmol/L — AB (ref 3.5–5.1)
Potassium: 2.5 mmol/L — CL (ref 3.5–5.1)
SODIUM: 141 mmol/L (ref 135–145)
Sodium: 141 mmol/L (ref 135–145)

## 2015-09-28 LAB — CBC WITH DIFFERENTIAL/PLATELET
BASOS PCT: 1 %
Basophils Absolute: 0.1 10*3/uL (ref 0.0–0.1)
Eosinophils Absolute: 0.4 10*3/uL (ref 0.0–0.7)
Eosinophils Relative: 5 %
HEMATOCRIT: 33.8 % — AB (ref 36.0–46.0)
HEMOGLOBIN: 10 g/dL — AB (ref 12.0–15.0)
LYMPHS ABS: 2.5 10*3/uL (ref 0.7–4.0)
Lymphocytes Relative: 26 %
MCH: 25.6 pg — ABNORMAL LOW (ref 26.0–34.0)
MCHC: 29.6 g/dL — AB (ref 30.0–36.0)
MCV: 86.7 fL (ref 78.0–100.0)
MONOS PCT: 11 %
Monocytes Absolute: 1 10*3/uL (ref 0.1–1.0)
NEUTROS ABS: 5.7 10*3/uL (ref 1.7–7.7)
NEUTROS PCT: 59 %
Platelets: 211 10*3/uL (ref 150–400)
RBC: 3.9 MIL/uL (ref 3.87–5.11)
RDW: 14.4 % (ref 11.5–15.5)
WBC: 9.7 10*3/uL (ref 4.0–10.5)

## 2015-09-28 LAB — MAGNESIUM: MAGNESIUM: 1.8 mg/dL (ref 1.7–2.4)

## 2015-09-28 MED ORDER — METOLAZONE 5 MG PO TABS
5.0000 mg | ORAL_TABLET | Freq: Every day | ORAL | Status: AC
  Administered 2015-09-28: 5 mg via ORAL
  Filled 2015-09-28: qty 1

## 2015-09-28 MED ORDER — POTASSIUM CHLORIDE CRYS ER 20 MEQ PO TBCR
40.0000 meq | EXTENDED_RELEASE_TABLET | Freq: Two times a day (BID) | ORAL | Status: DC
  Administered 2015-09-28 – 2015-09-29 (×2): 40 meq via ORAL
  Filled 2015-09-28 (×2): qty 2

## 2015-09-28 MED ORDER — MAGNESIUM SULFATE 2 GM/50ML IV SOLN
2.0000 g | Freq: Once | INTRAVENOUS | Status: AC
  Administered 2015-09-28: 2 g via INTRAVENOUS
  Filled 2015-09-28: qty 50

## 2015-09-28 MED ORDER — POTASSIUM CHLORIDE CRYS ER 20 MEQ PO TBCR
40.0000 meq | EXTENDED_RELEASE_TABLET | Freq: Once | ORAL | Status: AC
  Administered 2015-09-28: 40 meq via ORAL
  Filled 2015-09-28: qty 2

## 2015-09-28 MED ORDER — POTASSIUM CHLORIDE 20 MEQ/15ML (10%) PO SOLN
40.0000 meq | ORAL | Status: AC
  Administered 2015-09-28 (×2): 40 meq via ORAL
  Filled 2015-09-28 (×2): qty 30

## 2015-09-28 NOTE — Progress Notes (Signed)
K 2.5, due for additional 80mg  IV lasix shortly  rx'd immediate release K for now and again in 4 hours. I asked RN to hold off on given AM lasix until she has received 2nd dose of potassium.   12pm lytes ordered.

## 2015-09-28 NOTE — Progress Notes (Signed)
Patient ID: [name], female   DOB: [birth year], 75 y.o.   MRN: [id number]  [facility] ROUNDING NOTE  Subjective:  K 2.5 this morning. Has received 40 meq of K already.  Out only ~ 1L. Down 4 lbs however. Feeling better.  Was feeling "twitchy" overnight with low potassium and didn't get much sleep with extra diuresis.  Overall feeling better.   TEE: Normal LV size with mild LV hypertrophy. EF 60%. Normal wall motion. Moderately dilated RV with mildly decreased systolic function. Moderate tricuspid regurgitation. The mitral valve was rheumatic-appearing, moderately calcified with relatively fixed posterior leaflet. Mean gradient 5 mmHg with MVA by PHT 1.9 cm^2. Mild to moderate mitral stenosis with mild mitral regurgitation. The aortic valve was trileaflet and moderately calcified. There was a small 0.8 cm mobile mass on the LV side of the aortic valve. There was mild aortic insufficiency and mild aortic stenosis. There was probably a small PFO (weakly positive bubble study). Moderate biatrial enlargement with no LAA thrombus. Grade III-IV plaque in the descending thoracic aorta.   RHC Procedural Findings: Hemodynamics (mmHg) RA mean 16 RV 65/14 PA 71/28, mean 45 PCWP mean 32 Oxygen saturations: PA 59% AO 95% Cardiac Output (Fick) 4.85  Cardiac Index (Fick) 2.51 PVR 2.68 WU  Scheduled Meds: . calcium-vitamin D  2 tablet Oral Q breakfast  . furosemide  80 mg Intravenous 3 times per day  . gabapentin  300 mg Oral BID  . loratadine  10 mg Oral Daily  . metoprolol  50 mg Oral BID  . pantoprazole  40 mg Oral Daily  . potassium chloride  40 mEq Oral Q4H  . potassium chloride SA  20 mEq Oral BID  . rivaroxaban  20 mg Oral QHS  . sertraline  100 mg Oral QHS  . simvastatin  40 mg Oral q1800  . sodium chloride flush  3 mL Intravenous Q12H  . spironolactone  25 mg Oral Daily   Continuous Infusions:  PRN Meds:.sodium chloride, acetaminophen, nitroGLYCERIN, ondansetron  (ZOFRAN) IV, sodium chloride flush    Filed Vitals:   09/27/15 2018 09/27/15 2200 09/27/15 2213 09/28/15 0330  BP: 128/36 122/53 122/53   Pulse:  64 64 76  Temp: 98.2 F (36.8 C)   97.6 F (36.4 C)  TempSrc: Oral   Oral  Resp:    18  Height:      Weight:    180 lb 12.4 oz (82 kg)  SpO2: 94%   96%    Intake/Output Summary (Last 24 hours) at 09/28/15 0823 Last data filed at 09/28/15 0512  Gross per 24 hour  Intake    720 ml  Output   2450 ml  Net  -1730 ml    LABS: Basic Metabolic Panel:  Recent Labs  78/29/56 0601 09/28/15 0407  NA 140 141  K 4.4 2.5*  CL 93* 81*  CO2 37* 45*  GLUCOSE 104* 117*  BUN 14 20  CREATININE 0.89 0.99  CALCIUM 9.1 9.6   Liver Function Tests: No results for input(s): AST, ALT, ALKPHOS, BILITOT, PROT, ALBUMIN in the last 72 hours. No results for input(s): LIPASE, AMYLASE in the last 72 hours. CBC:  Recent Labs  09/27/15 0601 09/28/15 0407  WBC 7.7 9.7  NEUTROABS 5.1 5.7  HGB 9.2* 10.0*  HCT 33.1* 33.8*  MCV 88.0 86.7  PLT 198 211   Cardiac Enzymes: No results for input(s): CKTOTAL, CKMB, CKMBINDEX, TROPONINI in the last 72 hours. BNP: Invalid input(s): POCBNP D-Dimer: No  results for input(s): DDIMER in the last 72 hours. Hemoglobin A1C: No results for input(s): HGBA1C in the last 72 hours. Fasting Lipid Panel: No results for input(s): CHOL, HDL, LDLCALC, TRIG, CHOLHDL, LDLDIRECT in the last 72 hours. Thyroid Function Tests: No results for input(s): TSH, T4TOTAL, T3FREE, THYROIDAB in the last 72 hours.  Invalid input(s): FREET3 Anemia Panel: No results for input(s): VITAMINB12, FOLATE, FERRITIN, TIBC, IRON, RETICCTPCT in the last 72 hours.  RADIOLOGY: Dg Chest 2 View  09/24/2015  CLINICAL DATA:  Acute on chronic shortness of breath, worsening today. EXAM: CHEST  2 VIEW COMPARISON:  10/14/2014 FINDINGS: Cardiomegaly and mile interstitial opacities are identified compatible with mild interstitial edema. There are trace  bilateral pleural effusions present. CABG changes are present. There is no evidence of pneumothorax or acute bony abnormality. IMPRESSION: Cardiomegaly with mild interstitial pulmonary edema and trace bilateral pleural effusions. Electronically Signed   By: Harmon Pier M.D.   On: 09/24/2015 17:34    PHYSICAL EXAM General: NAD Neck: JVP 10 cm (near jaw), no thyromegaly or thyroid nodule.  Lungs: Slight basilar crackles bilaterally. CV: Nondisplaced PMI.  Heart regular with ectopy S1/S2, no S3/S4, 2/6 HSM LLSB.  1+ ankle edema.  Abdomen: Soft, nontender, no hepatosplenomegaly, no distention.  Neurologic: Alert and oriented x 3.  Psych: Normal affect. Extremities: No clubbing or cyanosis.   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  1. 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 PCWP 32. Possible that mitral stenosis plays a role, but appeared only mild to moderate by TEE. 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 Still volume overloaded, diuresed better with metolazone yesterday.  - Continue Lasix 80 mg IV every 8 hrs with metolazone 5 mg po x 1.   - Creatinine stable. - Supp K aggressively today as below.  BMET in for repeat at 1200. 2. Pulmonary hypertension: Primarily pulmonary venous hypertension due to elevated left atrial pressure.  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 Patient recently had tooth pain and was on antibiotics for her teeth. This may be a potential source. Afebrile with WBCs normal.  She had 1/2 Coag negative Staph, suspect contaminant.  Seen by ID, recommend holding off on abx for now.  4. Mitral stenosis: Suspect  rheumatic. By TEE, only appears mild to moderate. Had planned to do right and left heart cath and cross aortic valve to assess MS by cath, but with possible aortic valve vegetation we held off on left heart cath and crossing valve. Down the road could be valvuloplasty candidate, will need close followup.   5. COPD: Severe by PFTs. This certainly contributes to her symptoms. 6. Deconditioning, severe - Work with PT and CR.  7. Hypokalemia - In for 40 meq x 2 this am. Will give an additional 40 meq this pm and leave on 20 meq BID. Total of 160 meq today.  - Agree with repeat BMET. Will address further based on results.   Graciella Freer  PA-C 09/28/2015 8:23 AM  Advanced Heart Failure Team Pager [phone] (M-F; 7a - 4p)  Please contact [facility] for night-coverage after hours (4p -7a ) and weekends on [email or web address]  Patient seen with PA, agree with  the above note. Replace K aggressively today.  Weight down 4 lbs and breathing better, not sure how accurate I/Os are.  Continue current regimen of Lasix IV + metolazone today, still volume overloaded.  Will need BMET early afternoon.   No antibiotics for now per ID.   Marca Ancona 09/28/2015 8:46 AM

## 2015-09-28 NOTE — Progress Notes (Signed)
UR Completed Jermarion Poffenberger Graves-Bigelow, RN,BSN [phone]  

## 2015-09-28 NOTE — Progress Notes (Signed)
CARDIAC REHAB PHASE I   PRE:  Rate/Rhythm: 81 SR c/ PACs  BP:  Sitting: 124/44        SaO2: 95 2L  MODE:  Ambulation: 150 ft   POST:  Rate/Rhythm: 89 SR c/ PVCs  BP:  Sitting: 130/72         SaO2: 92 2L  Pt ambulated 150 ft on 2L O2, rolling walker, gait belt, fairly steady gait, assist x1, tolerated well. Pt c/o weakness/aching in her arms, DOE, fatigue, brief standing rest x2. Reviewed CHF education with pt at bedside. Discussed CHF booklet and zone tool, daily weights, sodium and fluid restrictions. Pt verbalized understanding, pt admits she has not been weighing herself regularly at home. Pt not a candidate for CRP2 at this time due to EF%. Pt to recliner after walk, feet elevated, call bell within reach. Will follow as schedule permits.   [id number] Joylene Grapes, RN, BSN 09/28/2015 9:25 AM

## 2015-09-28 NOTE — Progress Notes (Signed)
Patient ID: [name], female   DOB: 05/06/1941, 75 y.o.   MRN: [id number]         [facility]    Date of Admission:  09/24/2015     Principal Problem:   Aortic valve vegetation Active Problems:   Acute on chronic diastolic (congestive) heart failure (HCC)   Acute diastolic (congestive) heart failure (HCC)   . calcium-vitamin D  2 tablet Oral Q breakfast  . furosemide  80 mg Intravenous 3 times per day  . gabapentin  300 mg Oral BID  . loratadine  10 mg Oral Daily  . metolazone  5 mg Oral Daily  . metoprolol  50 mg Oral BID  . pantoprazole  40 mg Oral Daily  . potassium chloride SA  20 mEq Oral BID  . potassium chloride  40 mEq Oral Once  . rivaroxaban  20 mg Oral QHS  . sertraline  100 mg Oral QHS  . simvastatin  40 mg Oral q1800  . sodium chloride flush  3 mL Intravenous Q12H  . spironolactone  25 mg Oral Daily    SUBJECTIVE: She is feeling less short of breath. She did have some mild night sweats last night but has no other new symptoms.  Review of Systems: Review of Systems  Constitutional: Positive for diaphoresis. Negative for fever, chills and malaise/fatigue.  HENT: Negative for sore throat.   Respiratory: Positive for shortness of breath. Negative for cough and sputum production.   Cardiovascular: Negative for chest pain.  Musculoskeletal: Negative for myalgias and joint pain.  Skin: Negative for rash.  Neurological: Negative for headaches.    Past Medical History  Diagnosis Date  . COPD (chronic obstructive pulmonary disease) (HCC)   . Pulmonary HTN (HCC)     PASP 50-41mmHg by [id number]  . History of rheumatic heart disease     X 3-LAST TIME WHEN PATIENT WAS 75 YRS OLD  . Sleep apnea     intolerant to CPAP  . Hypertension   . Bursitis, shoulder     bilateral  . GERD (gastroesophageal reflux disease)   . H/O hiatal hernia   . Anxiety   . Depression   . Varicose veins   . PUD (peptic ulcer disease)   . Coronary  heart disease 2001    s/p CABG  . DJD (degenerative joint disease)   . OA (osteoarthritis)   . Hyperlipidemia   . Urinary incontinence   . Pulmonary HTN (HCC)   . Thyroid nodule   . CHF (congestive heart failure) (HCC)     chronic diasotlic CHF  . Complication of anesthesia     "have trouble getting me awake sometimes; been ok latelhy" (09/24/2015)  . On home oxygen therapy     "2L; 24/7" (09/24/2015)    Social History  Substance Use Topics  . Smoking status: Former Smoker -- 1.00 packs/day for 47 years    Types: Cigarettes    Quit date: 07/26/2007  . Smokeless tobacco: Never Used  . Alcohol Use: No    Family History  Problem Relation Age of Onset  . Anemia Mother   . Emphysema Father   . Esophageal cancer Daughter    Allergies  Allergen Reactions  . Prednisone Rash    jittery  . Xopenex [Levalbuterol]     Made mouth and throat sore  . Cefpodoxime Other (See Comments)    Yeast infections   . Clindamycin Other (See Comments)    Unknown   .  Levalbuterol Hcl Other (See Comments)    Unknown   . Lipitor [Atorvastatin]     Other reaction(s): Other (See Comments) Made joint start hurting  Muscle aches  . Other Swelling    Venholin HFA  . Penicillins Other (See Comments)    Unknown, childhood reaction   . Tape Other (See Comments)    Bleeding  . Ventolin [Kdc:Albuterol]     "jittery"  . Codeine Other (See Comments)    anxious jittery  . Hydrocodone-Acetaminophen Anxiety  . Latex Itching and Rash    OBJECTIVE: Filed Vitals:   09/27/15 2200 09/27/15 2213 09/28/15 0330 09/28/15 1038  BP: 122/53 122/53  119/60  Pulse: 64 64 76 69  Temp:   97.6 F (36.4 C)   TempSrc:   Oral   Resp:   18   Height:      Weight:   180 lb 12.4 oz (82 kg)   SpO2:   96%    Body mass index is 27.49 kg/(m^2).  Physical Exam  Constitutional: She is oriented to person, place, and time.  She is in good spirits. She is sitting up in a chair.  Eyes: Conjunctivae are normal.    Cardiovascular: Normal rate and regular rhythm.   No murmur heard. Pulmonary/Chest: Breath sounds normal.  Abdominal: Soft. There is no tenderness.  Musculoskeletal: Normal range of motion.  Neurological: She is alert and oriented to person, place, and time.  Skin: No rash noted.  Psychiatric: Mood and affect normal.    Lab Results Lab Results  Component Value Date   WBC 9.7 09/28/2015   HGB 10.0* 09/28/2015   HCT 33.8* 09/28/2015   MCV 86.7 09/28/2015   PLT 211 09/28/2015    Lab Results  Component Value Date   CREATININE 0.99 09/28/2015   BUN 20 09/28/2015   NA 141 09/28/2015   K 2.5* 09/28/2015   CL 81* 09/28/2015   CO2 45* 09/28/2015    Lab Results  Component Value Date   ALT 15 09/24/2015   AST 21 09/24/2015   ALKPHOS 84 09/24/2015   BILITOT 0.5 09/24/2015     Microbiology: Recent Results (from the past 240 hour(s))  Culture, blood (routine x 2)     Status: None   Collection Time: 09/24/15  3:55 PM  Result Value Ref Range Status   Specimen Description BLOOD RIGHT ARM  Final   Special Requests BOTTLES DRAWN AEROBIC ONLY 1.6ML  Final   Culture  Setup Time   Final    GRAM POSITIVE COCCI IN CLUSTERS IN PEDIATRIC BOTTLE CRITICAL RESULT CALLED TO, READ BACK BY AND VERIFIED WITH: J ODDONO,RN AT 1306 09/25/15 BY L BENFIELD    Culture   Final    STAPHYLOCOCCUS SPECIES (COAGULASE NEGATIVE) THE SIGNIFICANCE OF ISOLATING THIS ORGANISM FROM A SINGLE SET OF BLOOD CULTURES WHEN MULTIPLE SETS ARE DRAWN IS UNCERTAIN. PLEASE NOTIFY THE MICROBIOLOGY DEPARTMENT WITHIN ONE WEEK IF SPECIATION AND SENSITIVITIES ARE REQUIRED.    Report Status 09/27/2015 FINAL  Final  Culture, blood (routine x 2)     Status: None (Preliminary result)   Collection Time: 09/24/15  4:03 PM  Result Value Ref Range Status   Specimen Description BLOOD LEFT HAND  Final   Special Requests BOTTLES DRAWN AEROBIC ONLY  Final   Culture NO GROWTH 3 DAYS  Final   Report Status PENDING  Incomplete   Culture, blood (routine x 2)     Status: None (Preliminary result)   Collection Time: 09/26/15  5:30  AM  Result Value Ref Range Status   Specimen Description BLOOD LEFT ANTECUBITAL  Final   Special Requests BOTTLES DRAWN AEROBIC AND ANAEROBIC 8CC  Final   Culture NO GROWTH 1 DAY  Final   Report Status PENDING  Incomplete  Culture, blood (routine x 2)     Status: None (Preliminary result)   Collection Time: 09/26/15  5:40 AM  Result Value Ref Range Status   Specimen Description BLOOD LEFT HAND  Final   Special Requests BOTTLES DRAWN AEROBIC AND ANAEROBIC 8CC  Final   Culture NO GROWTH 1 DAY  Final   Report Status PENDING  Incomplete     ASSESSMENT: She has not had any fever. She believes that her night sweats related to having her down and 3 blankets on last night. She's had no other clinical signs or symptoms to suggest active endocarditis. One of 4 blood cultures grew coagulase-negative staph which is almost certainly an insignificant contaminant. I will repeat 2 more blood cultures today. I still favor observation off of antibiotics. Assuming she remains afebrile and blood cultures are negative I can arrange to follow her up in my clinic within the next few weeks.  PLAN: 1. Observe off of antibiotics 2. 2 more sets of blood cultures today  Cliffton Asters, MD [facility] [phone] pager   [phone] cell 09/28/2015, 12:00 PM

## 2015-09-28 NOTE — Care Management Important Message (Signed)
Important Message  Patient Details  Name: RUKMINI ROBUCK MRN: [id number] Date of Birth: 1940-11-04   Medicare Important Message Given:  Yes    Kyla Balzarine 09/28/2015, 2:33 PM

## 2015-09-29 LAB — CBC WITH DIFFERENTIAL/PLATELET
BASOS ABS: 0.1 10*3/uL (ref 0.0–0.1)
BASOS PCT: 1 %
Eosinophils Absolute: 0.4 10*3/uL (ref 0.0–0.7)
Eosinophils Relative: 4 %
HEMATOCRIT: 38 % (ref 36.0–46.0)
HEMOGLOBIN: 10.9 g/dL — AB (ref 12.0–15.0)
LYMPHS PCT: 22 %
Lymphs Abs: 2.1 10*3/uL (ref 0.7–4.0)
MCH: 24.7 pg — ABNORMAL LOW (ref 26.0–34.0)
MCHC: 28.7 g/dL — ABNORMAL LOW (ref 30.0–36.0)
MCV: 86.2 fL (ref 78.0–100.0)
MONOS PCT: 11 %
Monocytes Absolute: 1 10*3/uL (ref 0.1–1.0)
Neutro Abs: 6 10*3/uL (ref 1.7–7.7)
Neutrophils Relative %: 62 %
Platelets: 248 10*3/uL (ref 150–400)
RBC: 4.41 MIL/uL (ref 3.87–5.11)
RDW: 14.4 % (ref 11.5–15.5)
WBC: 9.7 10*3/uL (ref 4.0–10.5)

## 2015-09-29 LAB — BASIC METABOLIC PANEL
Anion gap: 16 — ABNORMAL HIGH (ref 5–15)
BUN: 29 mg/dL — AB (ref 6–20)
BUN: 32 mg/dL — ABNORMAL HIGH (ref 6–20)
CALCIUM: 10 mg/dL (ref 8.9–10.3)
CHLORIDE: 79 mmol/L — AB (ref 101–111)
CO2: >50 mmol/L — ABNORMAL HIGH (ref 22–32)
CO2: 44 mmol/L — AB (ref 22–32)
CREATININE: 1.12 mg/dL — AB (ref 0.44–1.00)
Calcium: 10.3 mg/dL (ref 8.9–10.3)
Chloride: 78 mmol/L — ABNORMAL LOW (ref 101–111)
Creatinine, Ser: 1.19 mg/dL — ABNORMAL HIGH (ref 0.44–1.00)
GFR calc Af Amer: 54 mL/min — ABNORMAL LOW (ref >60)
GFR calc non Af Amer: 44 mL/min — ABNORMAL LOW (ref >60)
GFR, EST AFRICAN AMERICAN: 50 mL/min — AB (ref >60)
GFR, EST NON AFRICAN AMERICAN: 47 mL/min — AB (ref >60)
GLUCOSE: 116 mg/dL — AB (ref 65–99)
Glucose, Bld: 111 mg/dL — ABNORMAL HIGH (ref 65–99)
POTASSIUM: 3 mmol/L — AB (ref 3.5–5.1)
POTASSIUM: 3 mmol/L — AB (ref 3.5–5.1)
SODIUM: 139 mmol/L (ref 135–145)
Sodium: 141 mmol/L (ref 135–145)

## 2015-09-29 LAB — CULTURE, BLOOD (ROUTINE X 2): Culture: NO GROWTH

## 2015-09-29 LAB — BRAIN NATRIURETIC PEPTIDE: B Natriuretic Peptide: 165.4 pg/mL — ABNORMAL HIGH (ref 0.0–100.0)

## 2015-09-29 MED ORDER — TORSEMIDE 20 MG PO TABS
40.0000 mg | ORAL_TABLET | Freq: Two times a day (BID) | ORAL | Status: DC
  Administered 2015-09-29 – 2015-09-30 (×2): 40 mg via ORAL
  Filled 2015-09-29 (×2): qty 2

## 2015-09-29 MED ORDER — POTASSIUM CHLORIDE CRYS ER 20 MEQ PO TBCR: 80.0000 meq | EXTENDED_RELEASE_TABLET | Freq: Once | ORAL | Status: DC

## 2015-09-29 MED ORDER — METOLAZONE 5 MG PO TABS
5.0000 mg | ORAL_TABLET | Freq: Every day | ORAL | Status: AC
  Administered 2015-09-29: 5 mg via ORAL
  Filled 2015-09-29: qty 1

## 2015-09-29 MED ORDER — POTASSIUM CHLORIDE CRYS ER 20 MEQ PO TBCR
40.0000 meq | EXTENDED_RELEASE_TABLET | Freq: Four times a day (QID) | ORAL | Status: AC
  Administered 2015-09-29 (×2): 40 meq via ORAL
  Filled 2015-09-29 (×2): qty 2

## 2015-09-29 MED ORDER — POTASSIUM CHLORIDE CRYS ER 20 MEQ PO TBCR
40.0000 meq | EXTENDED_RELEASE_TABLET | ORAL | Status: AC
  Administered 2015-09-29 (×2): 40 meq via ORAL
  Filled 2015-09-29 (×2): qty 2

## 2015-09-29 NOTE — Progress Notes (Signed)
Text paged Dr Sander Radon re: AM labs and he gave a verbal order to give and additional 80 meqs of Potasium today, will give 1st dose when up, no other changes noted.

## 2015-09-29 NOTE — Progress Notes (Signed)
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     PT Comments    Slow progress towards goals, but ambulating with very little assist nonetheless.  Feel continued skilled PT in the acute setting will assist with d/c home with family support and HHPT.  Follow Up Recommendations  Home health PT;Other (comment)     Equipment Recommendations  3in1 (PT)    Recommendations for Other Services       Precautions / Restrictions Precautions Precautions: Fall    Mobility  Bed Mobility Overal bed mobility: Needs Assistance Bed Mobility: Supine to Sit     Supine to sit: Supervision;HOB elevated     General bed mobility comments: assist for untangling lines  Transfers   Equipment used: Rolling walker (2 wheeled) Transfers: Sit to/from Stand Sit to Stand: Supervision         General transfer comment: cues for hand placement initial minguard for safety, but able to stand unaided from chair in hallway after seated rest   Ambulation/Gait Ambulation/Gait assistance: Min guard Ambulation Distance (Feet): 150 Feet (one seated rest) Assistive device: Rolling walker (2 wheeled) Gait Pattern/deviations: Step-through pattern;Decreased stride length;Trendelenburg     General Gait Details: c/o UE fatigue with using walker, stopped once to stand and rest arms off walker (arms flexed due to walker height); assist for safety, noted R hip weakness/trendelenberg   Stairs            Wheelchair Mobility    Modified Rankin (Stroke Patients Only)       Balance Overall balance assessment: Needs assistance         Standing balance support: No upper extremity supported Standing balance-Leahy Scale:  Poor Standing balance comment: No UE supported during rest standing in hallway with supervision to minguard for balance                    Cognition Arousal/Alertness: Awake/alert Behavior During Therapy: WFL for tasks assessed/performed Overall Cognitive Status: Within Functional Limits for tasks assessed                      Exercises General Exercises - Lower Extremity Long Arc Quad: Strengthening;Both;10 reps;Seated Hip Flexion/Marching: Strengthening;Both;10 reps;Seated Heel Raises:  (attempted seated, but c/o cramping in calves)    General Comments General comments (skin integrity, edema, etc.): maintained on 2L O2; HR 91, BP 137/80      Pertinent Vitals/Pain Pain Assessment: Faces Faces Pain Scale: Hurts a little bit Pain Location: reports cramps in hands and calves at times due to medication, no pain initially Pain Descriptors / Indicators: Cramping Pain Intervention(s): Monitored during session;Limited activity within patient's tolerance    Home Living                      Prior Function            PT Goals (current goals can now be found in the care plan section) Progress towards PT goals: Progressing toward goals    Frequency  Min 3X/week    PT Plan Current plan remains appropriate    Co-evaluation             End of Session Equipment Utilized During Treatment: Gait belt;Oxygen  Activity Tolerance: Patient limited by fatigue Patient left: in chair;with call bell/phone within reach     Time: 1050-1110 PT Time Calculation (min) (ACUTE ONLY): 20 min  Charges:  $Gait Training: 8-22 mins                    G Codes:      Elray Mcgregor October 28, 2015, 12:00 PM  Sheran Lawless, PT [phone] 10-28-2015

## 2015-09-29 NOTE — Plan of Care (Signed)
Problem: Education: Goal: Knowledge of [facility] Education information/materials will improve Outcome: Progressing Reviewed with patient information re: electrolyte imbalances and how they affect the heart, patient verbalized understanding, all questions answered at this time. Patient follows safety rules that were given to her on admission and calls me on my phone when she needs assistance, will continue to monitor

## 2015-09-29 NOTE — Progress Notes (Signed)
Patient ID: [name], female   DOB: 29-Aug-1940, 75 y.o.   MRN: [id number]         [facility]    Date of Admission:  09/24/2015     Principal Problem:   Aortic valve vegetation Active Problems:   Acute on chronic diastolic (congestive) heart failure (HCC)   Acute diastolic (congestive) heart failure (HCC)   . calcium-vitamin D  2 tablet Oral Q breakfast  . gabapentin  300 mg Oral BID  . loratadine  10 mg Oral Daily  . metoprolol  50 mg Oral BID  . pantoprazole  40 mg Oral Daily  . potassium chloride  40 mEq Oral 4 times per day  . rivaroxaban  20 mg Oral QHS  . sertraline  100 mg Oral QHS  . simvastatin  40 mg Oral q1800  . sodium chloride flush  3 mL Intravenous Q12H  . spironolactone  25 mg Oral Daily  . torsemide  40 mg Oral BID    SUBJECTIVE: She is feeling less short of breath. She did not have any chills or sweats last night.  Review of Systems: Review of Systems  Constitutional: Negative for fever, chills, malaise/fatigue and diaphoresis.  HENT: Negative for sore throat.   Respiratory: Positive for shortness of breath. Negative for cough and sputum production.   Cardiovascular: Negative for chest pain.  Musculoskeletal: Negative for myalgias and joint pain.  Skin: Negative for rash.  Neurological: Negative for headaches.    Past Medical History  Diagnosis Date  . COPD (chronic obstructive pulmonary disease) (HCC)   . Pulmonary HTN (HCC)     PASP 50-72mmHg by GBEE1/0071  . History of rheumatic heart disease     X 3-LAST TIME WHEN PATIENT WAS 75 YRS OLD  . Sleep apnea     intolerant to CPAP  . Hypertension   . Bursitis, shoulder     bilateral  . GERD (gastroesophageal reflux disease)   . H/O hiatal hernia   . Anxiety   . Depression   . Varicose veins   . PUD (peptic ulcer disease)   . 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 DJD (degenerative joint disease)   . OA (osteoarthritis)   . Hyperlipidemia   . Urinary  incontinence   . Pulmonary HTN (HCC)   . Thyroid nodule   . CHF (congestive heart failure) (HCC)     chronic diasotlic CHF  . Complication of anesthesia     "have trouble getting me awake sometimes; been ok latelhy" (09/24/2015)  . On home oxygen therapy     "2L; 24/7" (09/24/2015)    Social History  Substance Use Topics  . Smoking status: Former Smoker -- 1.00 packs/day for 47 years    Types: Cigarettes    Quit date: 07/26/2007  . Smokeless tobacco: Never Used  . Alcohol Use: No    Family History  Problem Relation Age of Onset  . Anemia Mother   . Emphysema Father   . Esophageal cancer Daughter    Allergies  Allergen Reactions  . Prednisone Rash    jittery  . Xopenex [Levalbuterol]     Made mouth and throat sore  . Cefpodoxime Other (See Comments)    Yeast infections   . Clindamycin Other (See Comments)    Unknown   . Levalbuterol Hcl Other (See Comments)    Unknown   . Lipitor [Atorvastatin]     Other reaction(s): Other (See Comments) Made joint  start hurting  Muscle aches  . Other Swelling    Venholin HFA  . Penicillins Other (See Comments)    Unknown, childhood reaction   . Tape Other (See Comments)    Bleeding  . Ventolin [Kdc:Albuterol]     "jittery"  . Codeine Other (See Comments)    anxious jittery  . Hydrocodone-Acetaminophen Anxiety  . Latex Itching and Rash    OBJECTIVE: Filed Vitals:   09/28/15 2200 09/29/15 0000 09/29/15 0500 09/29/15 0834  BP: 123/61 126/53 143/80 163/72  Pulse: 96 72 76 94  Temp:  98.2 F (36.8 C) 98.3 F (36.8 C)   TempSrc:  Oral Oral   Resp:  18 18   Height:      Weight:   177 lb 4.8 oz (80.423 kg)   SpO2:  97% 96%    Body mass index is 26.96 kg/(m^2).  Physical Exam  Constitutional: She is oriented to person, place, and time.  She is in good spirits. She is sitting up in a chair.  Eyes: Conjunctivae are normal.  Cardiovascular: Normal rate and regular rhythm.   No murmur heard. Pulmonary/Chest: She has  rales.  Abdominal: Soft. There is no tenderness.  Musculoskeletal: Normal range of motion.  Neurological: She is alert and oriented to person, place, and time.  Skin: No rash noted.  Psychiatric: Mood and affect normal.    Lab Results Lab Results  Component Value Date   WBC 9.7 09/29/2015   HGB 10.9* 09/29/2015   HCT 38.0 09/29/2015   MCV 86.2 09/29/2015   PLT 248 09/29/2015    Lab Results  Component Value Date   CREATININE 1.12* 09/29/2015   BUN 29* 09/29/2015   NA 141 09/29/2015   K 3.0* 09/29/2015   CL 78* 09/29/2015   CO2 >50* 09/29/2015    Lab Results  Component Value Date   ALT 15 09/24/2015   AST 21 09/24/2015   ALKPHOS 84 09/24/2015   BILITOT 0.5 09/24/2015     Microbiology: Recent Results (from the past 240 hour(s))  Culture, blood (routine x 2)     Status: None   Collection Time: 09/24/15  3:55 PM  Result Value Ref Range Status   Specimen Description BLOOD RIGHT ARM  Final   Special Requests BOTTLES DRAWN AEROBIC ONLY 1.6ML  Final   Culture  Setup Time   Final    GRAM POSITIVE COCCI IN CLUSTERS IN PEDIATRIC BOTTLE CRITICAL RESULT CALLED TO, READ BACK BY AND VERIFIED WITH: J ODDONO,RN AT 1306 09/25/15 BY L BENFIELD    Culture   Final    STAPHYLOCOCCUS SPECIES (COAGULASE NEGATIVE) THE SIGNIFICANCE OF ISOLATING THIS ORGANISM FROM A SINGLE SET OF BLOOD CULTURES WHEN MULTIPLE SETS ARE DRAWN IS UNCERTAIN. PLEASE NOTIFY THE MICROBIOLOGY DEPARTMENT WITHIN ONE WEEK IF SPECIATION AND SENSITIVITIES ARE REQUIRED.    Report Status 09/27/2015 FINAL  Final  Culture, blood (routine x 2)     Status: None (Preliminary result)   Collection Time: 09/24/15  4:03 PM  Result Value Ref Range Status   Specimen Description BLOOD LEFT HAND  Final   Special Requests BOTTLES DRAWN AEROBIC ONLY  Final   Culture NO GROWTH 4 DAYS  Final   Report Status PENDING  Incomplete  Culture, blood (routine x 2)     Status: None (Preliminary result)   Collection Time: 09/26/15  5:30 AM    Result Value Ref Range Status   Specimen Description BLOOD LEFT ANTECUBITAL  Final   Special Requests BOTTLES DRAWN  AEROBIC AND ANAEROBIC 8CC  Final   Culture NO GROWTH 2 DAYS  Final   Report Status PENDING  Incomplete  Culture, blood (routine x 2)     Status: None (Preliminary result)   Collection Time: 09/26/15  5:40 AM  Result Value Ref Range Status   Specimen Description BLOOD LEFT HAND  Final   Special Requests BOTTLES DRAWN AEROBIC AND ANAEROBIC 8CC  Final   Culture NO GROWTH 2 DAYS  Final   Report Status PENDING  Incomplete  Culture, blood (routine x 2)     Status: None (Preliminary result)   Collection Time: 09/28/15  1:00 PM  Result Value Ref Range Status   Specimen Description BLOOD LEFT ARM  Final   Special Requests BOTTLES DRAWN AEROBIC AND ANAEROBIC 10 CC  Final   Culture PENDING  Incomplete   Report Status PENDING  Incomplete     ASSESSMENT: She has not had any fever or other signs of infection. She has now had 6 sets of blood cultures. One grew coagulase-negative staph which is likely to be an insignificant contaminant. I doubt that the small mobile lesion seen on her aortic valve on TEE represents active endocarditis and would continue observation off of antibiotics. I will arrange follow-up in my clinic in the next 4 weeks. I will sign off now.  PLAN: 1. Observe off of antibiotics 2. I will arrange follow-up in my clinic within the next 4 weeks 3. I will sign off now  Cliffton Asters, MD [facility] [phone] pager   [phone] cell 09/29/2015, 9:12 AM

## 2015-09-29 NOTE — Progress Notes (Addendum)
Patient ID: [name], female   DOB: 11/11/1940, 75 y.o.   MRN: [id number]  [facility] ROUNDING NOTE  Subjective:  K 3.0 this morning. Had 180 meq supp yesterday, up from 2.5.  Scheduled for 160 meq supp throughout the day today.  K already. Weight coming down despite minimal negative output by I/Os. Down 3 more lbs.  Did not feel "twitchy" again last night with corrected K. Denies SOB. Wants to know when she can go home.   TEE: Normal LV size with mild LV hypertrophy. EF 60%. Normal wall motion. Moderately dilated RV with mildly decreased systolic function. Moderate tricuspid regurgitation. The mitral valve was rheumatic-appearing, moderately calcified with relatively fixed posterior leaflet. Mean gradient 5 mmHg with MVA by PHT 1.9 cm^2. Mild to moderate mitral stenosis with mild mitral regurgitation. The aortic valve was trileaflet and moderately calcified. There was a small 0.8 cm mobile mass on the LV side of the aortic valve. There was mild aortic insufficiency and mild aortic stenosis. There was probably a small PFO (weakly positive bubble study). Moderate biatrial enlargement with no LAA thrombus. Grade III-IV plaque in the descending thoracic aorta.   RHC Procedural Findings: Hemodynamics (mmHg) RA mean 16 RV 65/14 PA 71/28, mean 45 PCWP mean 32 Oxygen saturations: PA 59% AO 95% Cardiac Output (Fick) 4.85  Cardiac Index (Fick) 2.51 PVR 2.68 WU  Scheduled Meds: . calcium-vitamin D  2 tablet Oral Q breakfast  . furosemide  80 mg Intravenous 3 times per day  . gabapentin  300 mg Oral BID  . loratadine  10 mg Oral Daily  . metoprolol  50 mg Oral BID  . pantoprazole  40 mg Oral Daily  . potassium chloride SA  40 mEq Oral BID  . potassium chloride  40 mEq Oral 4 times per day  . rivaroxaban  20 mg Oral QHS  . sertraline  100 mg Oral QHS  . simvastatin  40 mg Oral q1800  . sodium chloride flush  3 mL Intravenous Q12H  . spironolactone  25 mg Oral Daily    Continuous Infusions:  PRN Meds:.sodium chloride, acetaminophen, nitroGLYCERIN, ondansetron (ZOFRAN) IV, sodium chloride flush    Filed Vitals:   09/28/15 2000 09/28/15 2200 09/29/15 0000 09/29/15 0500  BP: 145/92 123/61 126/53 143/80  Pulse: 72 96 72 76  Temp: 97.9 F (36.6 C)  98.2 F (36.8 C) 98.3 F (36.8 C)  TempSrc: Oral  Oral Oral  Resp: 18  18 18   Height:      Weight:    177 lb 4.8 oz (80.423 kg)  SpO2: 98%  97% 96%    Intake/Output Summary (Last 24 hours) at 09/29/15 0727 Last data filed at 09/29/15 0500  Gross per 24 hour  Intake   1070 ml  Output   1376 ml  Net   -306 ml    LABS: Basic Metabolic Panel:  Recent Labs  40/98/11 1300 09/29/15 0428  NA 141 141  K 3.4* 3.0*  CL 83* 78*  CO2 45* >50*  GLUCOSE 142* 116*  BUN 23* 29*  CREATININE 1.09* 1.12*  CALCIUM 9.9 10.0  MG 1.8  --    Liver Function Tests: No results for input(s): AST, ALT, ALKPHOS, BILITOT, PROT, ALBUMIN in the last 72 hours. No results for input(s): LIPASE, AMYLASE in the last 72 hours. CBC:  Recent Labs  09/28/15 0407 09/29/15 0428  WBC 9.7 9.7  NEUTROABS 5.7 6.0  HGB 10.0* 10.9*  HCT 33.8* 38.0  MCV  86.7 86.2  PLT 211 248   Cardiac Enzymes: No results for input(s): CKTOTAL, CKMB, CKMBINDEX, TROPONINI in the last 72 hours. BNP: Invalid input(s): POCBNP D-Dimer: No results for input(s): DDIMER in the last 72 hours. Hemoglobin A1C: No results for input(s): HGBA1C in the last 72 hours. Fasting Lipid Panel: No results for input(s): CHOL, HDL, LDLCALC, TRIG, CHOLHDL, LDLDIRECT in the last 72 hours. Thyroid Function Tests: No results for input(s): TSH, T4TOTAL, T3FREE, THYROIDAB in the last 72 hours.  Invalid input(s): FREET3 Anemia Panel: No results for input(s): VITAMINB12, FOLATE, FERRITIN, TIBC, IRON, RETICCTPCT in the last 72 hours.  RADIOLOGY: Dg Chest 2 View  09/24/2015  CLINICAL DATA:  Acute on chronic shortness of breath, worsening today. EXAM: CHEST  2  VIEW COMPARISON:  10/14/2014 FINDINGS: Cardiomegaly and mile interstitial opacities are identified compatible with mild interstitial edema. There are trace bilateral pleural effusions present. CABG changes are present. There is no evidence of pneumothorax or acute bony abnormality. IMPRESSION: Cardiomegaly with mild interstitial pulmonary edema and trace bilateral pleural effusions. Electronically Signed   By: Harmon Pier M.D.   On: 09/24/2015 17:34    PHYSICAL EXAM General: NAD Neck: JVP 7 cm, no thyromegaly or thyroid nodule.  Lungs: Slight basilar crackles bilaterally. CV: Nondisplaced PMI.  Heart regular with ectopy S1/S2, no S3/S4, 2/6 HSM LLSB.  No edema.   Abdomen: Soft, NT, ND, no HSM. No bruits or masses. +BS  Neurologic: Alert and oriented x 3.  Psych: Normal affect. Extremities: No clubbing or cyanosis.   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   1. 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 PCWP 32. Possible that mitral stenosis plays a role, but appeared only mild to moderate by TEE. 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 Weight down 8 lbs, looks near-euvolemic now.  - Stop IV Lasix, will convert over to torsemide 40 mg po bid for home.   - Creatinine stable, though trending up slightly.  - Supp K aggressively today as below.  BMET in for repeat at 1200. 2. Pulmonary hypertension: Primarily pulmonary venous hypertension due to elevated left atrial pressure.  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 Patient recently had tooth pain and was on antibiotics for her teeth. This may be a potential source. Afebrile with WBCs  normal.  She had 1/2 Coag negative Staph, suspect contaminant.  Seen by ID, recommend holding off on abx for now.  Repeated cultures, will have followup with Dr Orvan Falconer.  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 By TEE, only appears mild to moderate. Had planned to do right and left heart cath and cross aortic valve to assess MS by cath, but with possible aortic valve vegetation we held off on left heart cath and crossing valve. Down the road could be valvuloplasty candidate, will need close followup.   5. COPD: Severe by PFTs. This certainly contributes to her symptoms.  On home oxygen.  6. Deconditioning, severe - Work with PT and CR.  7. Hypokalemia - In for 40 meq x 4 today. Total of 160 meq today.  - Can repeat BMET again this afternoon.  8. Disposition: PT recommend HHPT. Possibly home tomorrow, but more likely Thursday with  transition to po meds in next 24-48 hours.  Graciella Freer  PA-C 09/29/2015 7:27 AM  Advanced Heart Failure Team Pager [phone] (M-F; 7a - 4p)  Please contact [facility] for night-coverage after hours (4p -7a ) and weekends on [email or web address]  Patient seen with PA, agree with the above note.  Volume status much improved, breathing better.  Got IV Lasix this morning, will convert over to torsemide for home.  Replace K.    No fever, blood cultures with only coag negative Staph, likely a contaminant.  Will followup with ID as outpatient.   Probably home tomorrow.   Marca Ancona 09/29/2015 8:56 AM

## 2015-09-29 NOTE — Progress Notes (Signed)
Report given in patient's room via [name] RN and was updated on new orders, labs, VS, meds and patient's general condition and events of the day, assumed care of patient.

## 2015-09-30 LAB — CBC WITH DIFFERENTIAL/PLATELET
BASOS ABS: 0.1 10*3/uL (ref 0.0–0.1)
BASOS PCT: 1 %
Eosinophils Absolute: 0.5 10*3/uL (ref 0.0–0.7)
Eosinophils Relative: 5 %
HCT: 39.3 % (ref 36.0–46.0)
HEMOGLOBIN: 11.6 g/dL — AB (ref 12.0–15.0)
LYMPHS PCT: 24 %
Lymphs Abs: 2.4 10*3/uL (ref 0.7–4.0)
MCH: 25.5 pg — AB (ref 26.0–34.0)
MCHC: 29.5 g/dL — ABNORMAL LOW (ref 30.0–36.0)
MCV: 86.4 fL (ref 78.0–100.0)
MONOS PCT: 11 %
Monocytes Absolute: 1.1 10*3/uL — ABNORMAL HIGH (ref 0.1–1.0)
NEUTROS ABS: 6.1 10*3/uL (ref 1.7–7.7)
Neutrophils Relative %: 59 %
Platelets: 267 10*3/uL (ref 150–400)
RBC: 4.55 MIL/uL (ref 3.87–5.11)
RDW: 14.7 % (ref 11.5–15.5)
WBC: 10.2 10*3/uL (ref 4.0–10.5)

## 2015-09-30 LAB — BASIC METABOLIC PANEL
ANION GAP: 16 — AB (ref 5–15)
BUN: 36 mg/dL — AB (ref 6–20)
CHLORIDE: 80 mmol/L — AB (ref 101–111)
CO2: 46 mmol/L — ABNORMAL HIGH (ref 22–32)
Calcium: 10.2 mg/dL (ref 8.9–10.3)
Creatinine, Ser: 1.11 mg/dL — ABNORMAL HIGH (ref 0.44–1.00)
GFR calc Af Amer: 55 mL/min — ABNORMAL LOW (ref >60)
GFR calc non Af Amer: 47 mL/min — ABNORMAL LOW (ref >60)
Glucose, Bld: 124 mg/dL — ABNORMAL HIGH (ref 65–99)
POTASSIUM: 4 mmol/L (ref 3.5–5.1)
Sodium: 142 mmol/L (ref 135–145)

## 2015-09-30 MED ORDER — SPIRONOLACTONE 25 MG PO TABS: 25.0000 mg | ORAL_TABLET | Freq: Every day | ORAL | Status: DC

## 2015-09-30 MED ORDER — TORSEMIDE 20 MG PO TABS: 40.0000 mg | ORAL_TABLET | Freq: Two times a day (BID) | ORAL | Status: DC

## 2015-09-30 MED ORDER — POTASSIUM CHLORIDE CRYS ER 20 MEQ PO TBCR: 20.0000 meq | EXTENDED_RELEASE_TABLET | ORAL | Status: DC

## 2015-09-30 NOTE — Care Management Note (Signed)
Case Management Note  Patient Details  Name: [name] MRN: [id number] Date of Birth: 1941/01/16  Subjective/Objective:  Acute on chronic diastolic CHF with RV failure: Admitted with marked volume overload. Plan for d/c home today. Pt is from home with husband. Pt has 02 at home 2 L via [place]. Husband to provide transport home and he is to bring 02. Referral made with [facility] for [facility] and SOC to begin within 24-48 hours of d/c. Pt refused DME 3n1. No other needs at this time.                  Action/Plan:   Expected Discharge Date:                  Expected Discharge Plan:  Home w Home Health Services  In-House Referral:  NA  Discharge planning Services  CM Consult  Post Acute Care Choice:  Home Health Choice offered to:  Patient  DME Arranged:  N/A DME Agency:  NA  HH Arranged:  RN, PT HH Agency:  [facility]  Status of Service:  Completed, signed off  Medicare Important Message Given:  Yes Date Medicare IM Given:    Medicare IM give by:    Date Additional Medicare IM Given:    Additional Medicare Important Message give by:     If discussed at Long Length of Stay Meetings, dates discussed:    Additional Comments:  Gala Lewandowsky, RN 09/30/2015, 10:43 AM

## 2015-09-30 NOTE — Progress Notes (Signed)
Patient ID: [name], female   DOB: [birth year], 75 y.o.   MRN: [id number]  [facility] ROUNDING NOTE  Subjective:  Feels fine this morning, breathing better with walks.  K normal today.   TEE: Normal LV size with mild LV hypertrophy. EF 60%. Normal wall motion. Moderately dilated RV with mildly decreased systolic function. Moderate tricuspid regurgitation. The mitral valve was rheumatic-appearing, moderately calcified with relatively fixed posterior leaflet. Mean gradient 5 mmHg with MVA by PHT 1.9 cm^2. Mild to moderate mitral stenosis with mild mitral regurgitation. The aortic valve was trileaflet and moderately calcified. There was a small 0.8 cm mobile mass on the LV side of the aortic valve. There was mild aortic insufficiency and mild aortic stenosis. There was probably a small PFO (weakly positive bubble study). Moderate biatrial enlargement with no LAA thrombus. Grade III-IV plaque in the descending thoracic aorta.   RHC Procedural Findings: Hemodynamics (mmHg) RA mean 16 RV 65/14 PA 71/28, mean 45 PCWP mean 32 Oxygen saturations: PA 59% AO 95% Cardiac Output (Fick) 4.85  Cardiac Index (Fick) 2.51 PVR 2.68 WU  Scheduled Meds: . calcium-vitamin D  2 tablet Oral Q breakfast  . gabapentin  300 mg Oral BID  . loratadine  10 mg Oral Daily  . metoprolol  50 mg Oral BID  . pantoprazole  40 mg Oral Daily  . rivaroxaban  20 mg Oral QHS  . sertraline  100 mg Oral QHS  . simvastatin  40 mg Oral q1800  . sodium chloride flush  3 mL Intravenous Q12H  . spironolactone  25 mg Oral Daily  . torsemide  40 mg Oral BID   Continuous Infusions:  PRN Meds:.sodium chloride, acetaminophen, nitroGLYCERIN, ondansetron (ZOFRAN) IV, sodium chloride flush    Filed Vitals:   09/29/15 1100 09/29/15 1311 09/29/15 2010 09/30/15 0439  BP: 137/80 133/82 129/52 131/76  Pulse: 91 92 84 82  Temp:  97.8 F (36.6 C) 98.1 F (36.7 C) 98.1 F (36.7 C)  TempSrc:  Oral Oral Oral    Resp:  20 18 16   Height:      Weight:    178 lb 8 oz (80.967 kg)  SpO2:  100% 92% 93%    Intake/Output Summary (Last 24 hours) at 09/30/15 0728 Last data filed at 09/30/15 0440  Gross per 24 hour  Intake   1140 ml  Output   1501 ml  Net   -361 ml    LABS: Basic Metabolic Panel:  Recent Labs  11/73/56 1300  09/29/15 1442 09/30/15 0400  NA 141  < > 139 142  K 3.4*  < > 3.0* 4.0  CL 83*  < > 79* 80*  CO2 45*  < > 44* 46*  GLUCOSE 142*  < > 111* 124*  BUN 23*  < > 32* 36*  CREATININE 1.09*  < > 1.19* 1.11*  CALCIUM 9.9  < > 10.3 10.2  MG 1.8  --   --   --   < > = values in this interval not displayed. Liver Function Tests: No results for input(s): AST, ALT, ALKPHOS, BILITOT, PROT, ALBUMIN in the last 72 hours. No results for input(s): LIPASE, AMYLASE in the last 72 hours. CBC:  Recent Labs  09/29/15 0428 09/30/15 0400  WBC 9.7 10.2  NEUTROABS 6.0 6.1  HGB 10.9* 11.6*  HCT 38.0 39.3  MCV 86.2 86.4  PLT 248 267   Cardiac Enzymes: No results for input(s): CKTOTAL, CKMB, CKMBINDEX, TROPONINI in the last 72  hours. BNP: Invalid input(s): POCBNP D-Dimer: No results for input(s): DDIMER in the last 72 hours. Hemoglobin A1C: No results for input(s): HGBA1C in the last 72 hours. Fasting Lipid Panel: No results for input(s): CHOL, HDL, LDLCALC, TRIG, CHOLHDL, LDLDIRECT in the last 72 hours. Thyroid Function Tests: No results for input(s): TSH, T4TOTAL, T3FREE, THYROIDAB in the last 72 hours.  Invalid input(s): FREET3 Anemia Panel: No results for input(s): VITAMINB12, FOLATE, FERRITIN, TIBC, IRON, RETICCTPCT in the last 72 hours.  RADIOLOGY: Dg Chest 2 View  09/24/2015  CLINICAL DATA:  Acute on chronic shortness of breath, worsening today. EXAM: CHEST  2 VIEW COMPARISON:  10/14/2014 FINDINGS: Cardiomegaly and mile interstitial opacities are identified compatible with mild interstitial edema. There are trace bilateral pleural effusions present. CABG changes are  present. There is no evidence of pneumothorax or acute bony abnormality. IMPRESSION: Cardiomegaly with mild interstitial pulmonary edema and trace bilateral pleural effusions. Electronically Signed   By: Harmon Pier M.D.   On: 09/24/2015 17:34    PHYSICAL EXAM General: NAD Neck: JVP 7 cm, no thyromegaly or thyroid nodule.  Lungs: Slight basilar crackles bilaterally. CV: Nondisplaced PMI.  Heart regular with ectopy S1/S2, no S3/S4, 2/6 HSM LLSB.  No edema.   Abdomen: Soft, NT, ND, no HSM. No bruits or masses. +BS  Neurologic: Alert and oriented x 3.  Psych: Normal affect. Extremities: No clubbing or cyanosis.   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   1. 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 PCWP 32. Possible that mitral stenosis plays a role, but appeared only mild to moderate by TEE. 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 Weight down 8 lbs, looks near-euvolemic now.  - Continue torsemide 40 mg po bid with KCl 40 qam/20 qpm.    - Creatinine stable, though trending up slightly.  2. Pulmonary hypertension: Primarily pulmonary venous hypertension due to elevated left atrial pressure.  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 Patient recently had tooth pain and was on antibiotics for her teeth. This may be a potential source. Afebrile with WBCs normal.  She had 1/2 Coag negative Staph, suspect contaminant.  Seen by ID, recommend holding off on abx for now.  Repeated cultures, will have followup with Dr Orvan Falconer.  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 By TEE, only appears mild to moderate. Had planned to do right and left heart  cath and cross aortic valve to assess MS by cath, but with possible aortic valve vegetation we held off on left heart cath and crossing valve. Down the road could be valvuloplasty candidate, will need close followup.   5. COPD: Severe by PFTs. This certainly contributes to her symptoms.  On home oxygen.  6. Deconditioning, severe - Work with PT and CR.  7. Hypokalemia: Resolved. 8. Atrial fibrillation: Paroxysmal.  She is on Xarelto.  9. Disposition: PT recommend HHPT. Needs followup in CHF clinic and with ID (Dr Orvan Falconer).  She can go home on torsemide 40 mg po bid, KCl 40 qam/20 qpm, spironolactone 25 daily, metoprolol 50 bid, Xarelto 20 daily, Zocor 40 daily.   Marca Ancona   09/30/2015 7:28 AM

## 2015-09-30 NOTE — Discharge Summary (Signed)
Advanced [facility] Failure Discharge Note   Discharge Summary   Patient ID: [name] MRN: [id number], DOB/AGE: 1941-03-05 75 y.o. Admit date: 09/24/2015 D/C date:     09/30/2015   Primary Discharge Diagnoses:  1. 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 PCWP 32.  2. Pulmonary hypertension: 3. Aortic valve vegitation: Concern for small vegetation on the aortic valve, ? Endocarditis.Marland Kitchen 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 By TEE, only appears mild to moderate.  5. COPD: 6. Deconditioning, severe 7. Hypokalemia:  8. Atrial fibrillation: Paroxysmal - on Xarelto.   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  Full TEE and RHC as below.  She was admitted for further evaluation of possible endocarditis and IV diuresis.  She stated she had been on an ABX recently for tooth pain, concern that this could be nidus for endocarditis. ID consulted. Recommended observation with serial exams and blood cultures. 1 of 2 admission blood cultures with Gram-positive cocci in clusters. Thought to be contaminant. Repeat cultures x 2 on 09/26/15 and 09/28/15 NGTD at time of discharge. WBC had slight uptrend but remained WNL with no fever.    She initially complained of SOB that improved with diuresis. She underwent aggressive diuresis with 80 mg lasix IV TID with 5 mg metolazone for several days.    Hospital course complicated by hypokalemia as low as 2.5. This required aggressive supplementation (up to 180 meq one day) but leveled out as she was transitioned to po diuretics.   Overall she diuresed 2.4 L and 8 lbs by highest weight this admission. She will be discharged to home in stable condition with close follow up as below.  Dr Orvan Falconer is going to schedule her for  follow up. Reminder sent and information left on patients AVS to call next week if she does not hear back.   Discharge Weight Range: 178 lbs Discharge Vitals: Blood pressure 131/76, pulse 84, temperature 98.1 F (36.7 C), temperature source Oral, resp. rate 16, height 5\' 8"  (1.727 m), weight 178 lb 8 oz (80.967 kg), SpO2 93 %.  TEE: Normal LV size with mild LV hypertrophy. EF 60%. Normal wall motion. Moderately dilated RV with mildly decreased systolic function. Moderate tricuspid regurgitation. The mitral valve was rheumatic-appearing, moderately calcified with relatively fixed posterior leaflet. Mean gradient 5 mmHg with MVA by PHT 1.9 cm^2. Mild to moderate mitral stenosis with mild mitral regurgitation. The aortic valve was trileaflet and moderately calcified. There was a small 0.8 cm mobile mass on the LV side of the aortic valve. There was mild aortic insufficiency and mild aortic stenosis. There was probably a small PFO (weakly positive bubble study). Moderate biatrial enlargement with no LAA thrombus. Grade III-IV plaque in the descending thoracic aorta.   RHC Procedural Findings: Hemodynamics (mmHg) RA mean 16 RV 65/14 PA 71/28, mean 45 PCWP mean 32 Oxygen saturations: PA 59% AO 95% Cardiac Output (Fick) 4.85  Cardiac Index (Fick) 2.51 PVR 2.68 WU  Labs: Lab Results  Component Value Date   WBC 10.2 09/30/2015   HGB 11.6* 09/30/2015   HCT 39.3 09/30/2015   MCV 86.4 09/30/2015   PLT 267 09/30/2015    Recent Labs Lab 09/24/15 1605  09/30/15 0400  NA 144  < > 142  K 3.5  < >  4.0  CL 99*  < > 80*  CO2 34*  < > 46*  BUN 8  < > 36*  CREATININE 0.86  < > 1.11*  CALCIUM 9.2  < > 10.2  PROT 7.3  --   --   BILITOT 0.5  --   --   ALKPHOS 84  --   --   ALT 15  --   --   AST 21  --   --   GLUCOSE 148*  < > 124*  < > = values in this interval not displayed. Lab Results  Component Value Date   CHOL 166 09/14/2015   HDL 67 09/14/2015   LDLCALC 82 09/14/2015     TRIG 84 09/14/2015   BNP (last 3 results)  Recent Labs  09/14/15 1519 09/24/15 1605 09/29/15 0428  BNP 208.5* 436.5* 165.4*    ProBNP (last 3 results)  Recent Labs  01/09/15 0907  PROBNP 245.0*     Diagnostic Studies/Procedures   No results found.  Discharge Medications     Medication List    STOP taking these medications        furosemide 80 MG tablet  Commonly known as:  LASIX      TAKE these medications        acetaminophen 500 MG tablet  Commonly known as:  TYLENOL  Take 1,000 mg by mouth daily as needed for headache.     alendronate 70 MG tablet  Commonly known as:  FOSAMAX  Take 70 mg by mouth once a week.     calcium-vitamin D 500-200 MG-UNIT tablet  Commonly known as:  OSCAL WITH D  Take 2 tablets by mouth daily with breakfast.     gabapentin 300 MG capsule  Commonly known as:  NEURONTIN  Take 300 mg by mouth 2 (two) times daily.     loratadine 10 MG tablet  Commonly known as:  CLARITIN  Take 10 mg by mouth daily.     metoprolol 50 MG tablet  Commonly known as:  LOPRESSOR  Take 1 tablet by mouth two  times daily     nitroGLYCERIN 0.4 MG SL tablet  Commonly known as:  NITROSTAT  Place 1 tablet (0.4 mg total) under the tongue every 5 (five) minutes as needed for chest pain.     omeprazole 20 MG tablet  Commonly known as:  PRILOSEC OTC  Take 20 mg by mouth daily as needed (GERD).     OXYGEN  Inhale 2 L into the lungs continuous.     potassium chloride SA 20 MEQ tablet  Commonly known as:  K-DUR,KLOR-CON  Take 1-2 tablets (20-40 mEq total) by mouth as directed. Take 2 tablets (40 meq) every morning, and 1 tablet (20 meq) every evening.     rivaroxaban 20 MG Tabs tablet  Commonly known as:  XARELTO  Take 1 tablet (20 mg total) by mouth at bedtime.     sertraline 100 MG tablet  Commonly known as:  ZOLOFT  Take 100 mg by mouth at bedtime.     simvastatin 40 MG tablet  Commonly known as:  ZOCOR  Take 1 tablet by mouth  every  evening     spironolactone 25 MG tablet  Commonly known as:  ALDACTONE  Take 1 tablet (25 mg total) by mouth daily.     torsemide 20 MG tablet  Commonly known as:  DEMADEX  Take 2 tablets (40 mg total) by mouth 2 (two) times daily.  Disposition   The patient will be discharged in stable condition to home. Discharge Instructions    Diet - low sodium heart healthy    Complete by:  As directed      Heart Failure patients record your daily weight using the same scale at the same time of day    Complete by:  As directed      Increase activity slowly    Complete by:  As directed           Follow-up Information    Follow up with Marca Ancona, MD On 10/12/2015.   Specialty:  Cardiology   Why:  at 2 pm for post hospital follow up. Please bring all of your medications to your visit. The code for patient parking is 0010.   Contact information:   [address] [place] [address] [phone]       Follow up with Cliffton Asters, MD.   Specialty:  Infectious Diseases   Why:  The office will call you with your appointment.  Please call at the number listed above next week if you have not heard from them.    Contact information:   [address] [place] [address] [phone]       Follow up with [facility].   Why:  Registered Nurse and Physical Thearpy   Contact information:   [address] [place] [address] [phone]         Duration of Discharge Encounter: Greater than 35 minutes   Signed, Graciella Freer PA-C 09/30/2015, 5:22 PM

## 2015-09-30 NOTE — Progress Notes (Signed)
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     PT Comments    Pt performed increased gait distance with decreased assist.  Noticeable wheeze post toilet transfer requiring lengthy seated rest break to obtain accurate SPO2 reading.  2L 93%-100%.  RN informed.    Follow Up Recommendations  Home health PT;Other (comment)     Equipment Recommendations  3in1 (PT)    Recommendations for Other Services       Precautions / Restrictions Precautions Precautions: Fall Restrictions Weight Bearing Restrictions: No    Mobility  Bed Mobility Overal bed mobility: Modified Independent Bed Mobility: Supine to Sit     Supine to sit: Modified independent (Device/Increase time)        Transfers Overall transfer level: Needs assistance Equipment used: Rolling walker (2 wheeled);None Transfers: Sit to/from [employer] to Stand: Supervision (with STS from RW pt required cues to hand placement to improve safety and ease.  ) Stand pivot transfers: Modified independent (Device/Increase time) (Pt performed transfer from bed to [facility] with MOD I.  )       General transfer comment: cues for hand placement initial minguard for safety, but able to stand unaided from chair in hallway after seated rest   Ambulation/Gait Ambulation/Gait assistance: Supervision Ambulation Distance (Feet): 206 Feet Assistive device: Rolling walker (2 wheeled) Gait Pattern/deviations: Step-to pattern;Decreased stride length;Trendelenburg     General Gait Details: c.o UE fatigue but required decreased assist.  Cues for safety with turns and backing.     Stairs            Wheelchair Mobility    Modified  [place] (Stroke Patients Only)       Balance                                    Cognition Arousal/Alertness: Awake/alert Behavior During Therapy: WFL for tasks assessed/performed Overall Cognitive Status: Within Functional Limits for tasks assessed                      Exercises      General Comments        Pertinent Vitals/Pain Pain Assessment: No/denies pain    Home Living                      Prior Function            PT Goals (current goals can now be found in the care plan section) Acute Rehab PT Goals Patient Stated Goal: wants to feel better Potential to Achieve Goals: Good Progress towards PT goals: Progressing toward goals    Frequency  Min 3X/week    PT Plan Current plan remains appropriate    Co-evaluation             End of Session Equipment Utilized During Treatment: Gait belt;Oxygen Activity Tolerance: Patient limited by fatigue Patient left: with call bell/phone within reach;in bed (sitting edge of bed.  )     Time: 6213-0865 PT Time Calculation (min) (ACUTE ONLY): 27 min  Charges:  $Gait Training: 8-22 mins $Therapeutic Activity: 8-22 mins  G Codes:      Florestine Avers 09/30/2015, 10:15 AM Joycelyn Rua, PTA pager [phone]

## 2015-09-30 NOTE — Progress Notes (Signed)
UR Completed Ismail Graziani Graves-Bigelow, RN,BSN [phone]  

## 2015-10-01 LAB — CULTURE, BLOOD (ROUTINE X 2)
CULTURE: NO GROWTH
Culture: NO GROWTH

## 2015-10-02 DIAGNOSIS — I5033 Acute on chronic diastolic (congestive) heart failure: Secondary | ICD-10-CM | POA: Diagnosis not present

## 2015-10-02 DIAGNOSIS — I130 Hypertensive heart and chronic kidney disease with heart failure and stage 1 through stage 4 chronic kidney disease, or unspecified chronic kidney disease: Secondary | ICD-10-CM | POA: Diagnosis not present

## 2015-10-03 ENCOUNTER — Encounter (HOSPITAL_COMMUNITY): Payer: Self-pay | Admitting: Emergency Medicine

## 2015-10-03 ENCOUNTER — Telehealth: Payer: Self-pay | Admitting: Physician Assistant

## 2015-10-03 ENCOUNTER — Inpatient Hospital Stay (HOSPITAL_COMMUNITY)
Admission: EM | Admit: 2015-10-03 | Discharge: 2015-10-05 | DRG: 683 | Disposition: A | Discharge status: Home / Self Care | Payer: Medicare Other | Attending: Internal Medicine | Admitting: Internal Medicine

## 2015-10-03 DIAGNOSIS — F329 Major depressive disorder, single episode, unspecified: Secondary | ICD-10-CM | POA: Diagnosis present

## 2015-10-03 DIAGNOSIS — G4730 Sleep apnea, unspecified: Secondary | ICD-10-CM | POA: Diagnosis present

## 2015-10-03 DIAGNOSIS — E785 Hyperlipidemia, unspecified: Secondary | ICD-10-CM | POA: Diagnosis present

## 2015-10-03 DIAGNOSIS — Z7901 Long term (current) use of anticoagulants: Secondary | ICD-10-CM | POA: Diagnosis not present

## 2015-10-03 DIAGNOSIS — Z87891 Personal history of nicotine dependence: Secondary | ICD-10-CM | POA: Diagnosis not present

## 2015-10-03 DIAGNOSIS — M1990 Unspecified osteoarthritis, unspecified site: Secondary | ICD-10-CM | POA: Diagnosis present

## 2015-10-03 DIAGNOSIS — E876 Hypokalemia: Secondary | ICD-10-CM | POA: Diagnosis present

## 2015-10-03 DIAGNOSIS — I480 Paroxysmal atrial fibrillation: Secondary | ICD-10-CM | POA: Diagnosis present

## 2015-10-03 DIAGNOSIS — R350 Frequency of micturition: Secondary | ICD-10-CM | POA: Diagnosis present

## 2015-10-03 DIAGNOSIS — K219 Gastro-esophageal reflux disease without esophagitis: Secondary | ICD-10-CM | POA: Diagnosis present

## 2015-10-03 DIAGNOSIS — Z951 Presence of aortocoronary bypass graft: Secondary | ICD-10-CM

## 2015-10-03 DIAGNOSIS — Z66 Do not resuscitate: Secondary | ICD-10-CM | POA: Diagnosis present

## 2015-10-03 DIAGNOSIS — J449 Chronic obstructive pulmonary disease, unspecified: Secondary | ICD-10-CM | POA: Diagnosis present

## 2015-10-03 DIAGNOSIS — I059 Rheumatic mitral valve disease, unspecified: Secondary | ICD-10-CM | POA: Diagnosis present

## 2015-10-03 DIAGNOSIS — Z9981 Dependence on supplemental oxygen: Secondary | ICD-10-CM | POA: Diagnosis not present

## 2015-10-03 DIAGNOSIS — I129 Hypertensive chronic kidney disease with stage 1 through stage 4 chronic kidney disease, or unspecified chronic kidney disease: Secondary | ICD-10-CM | POA: Diagnosis present

## 2015-10-03 DIAGNOSIS — I10 Essential (primary) hypertension: Secondary | ICD-10-CM | POA: Diagnosis present

## 2015-10-03 DIAGNOSIS — I5032 Chronic diastolic (congestive) heart failure: Secondary | ICD-10-CM | POA: Diagnosis present

## 2015-10-03 DIAGNOSIS — R531 Weakness: Secondary | ICD-10-CM | POA: Insufficient documentation

## 2015-10-03 DIAGNOSIS — T502X5A Adverse effect of carbonic-anhydrase inhibitors, benzothiadiazides and other diuretics, initial encounter: Secondary | ICD-10-CM | POA: Diagnosis present

## 2015-10-03 DIAGNOSIS — I272 Other secondary pulmonary hypertension: Secondary | ICD-10-CM | POA: Diagnosis present

## 2015-10-03 DIAGNOSIS — D649 Anemia, unspecified: Secondary | ICD-10-CM | POA: Diagnosis present

## 2015-10-03 DIAGNOSIS — I2510 Atherosclerotic heart disease of native coronary artery without angina pectoris: Secondary | ICD-10-CM | POA: Diagnosis present

## 2015-10-03 DIAGNOSIS — N182 Chronic kidney disease, stage 2 (mild): Secondary | ICD-10-CM | POA: Diagnosis present

## 2015-10-03 DIAGNOSIS — J432 Centrilobular emphysema: Secondary | ICD-10-CM

## 2015-10-03 DIAGNOSIS — N179 Acute kidney failure, unspecified: Principal | ICD-10-CM | POA: Diagnosis present

## 2015-10-03 LAB — CBC
HCT: 36.7 % (ref 36.0–46.0)
Hemoglobin: 10.5 g/dL — ABNORMAL LOW (ref 12.0–15.0)
MCH: 24.6 pg — AB (ref 26.0–34.0)
MCHC: 28.6 g/dL — AB (ref 30.0–36.0)
MCV: 86.2 fL (ref 78.0–100.0)
PLATELETS: 263 10*3/uL (ref 150–400)
RBC: 4.26 MIL/uL (ref 3.87–5.11)
RDW: 14.8 % (ref 11.5–15.5)
WBC: 8.2 10*3/uL (ref 4.0–10.5)

## 2015-10-03 LAB — BASIC METABOLIC PANEL
Anion gap: 17 — ABNORMAL HIGH (ref 5–15)
BUN: 49 mg/dL — AB (ref 6–20)
CALCIUM: 9.8 mg/dL (ref 8.9–10.3)
CHLORIDE: 71 mmol/L — AB (ref 101–111)
CO2: 47 mmol/L — AB (ref 22–32)
CREATININE: 1.33 mg/dL — AB (ref 0.44–1.00)
GFR calc non Af Amer: 38 mL/min — ABNORMAL LOW (ref >60)
GFR, EST AFRICAN AMERICAN: 44 mL/min — AB (ref >60)
GLUCOSE: 159 mg/dL — AB (ref 65–99)
Potassium: 2.4 mmol/L — CL (ref 3.5–5.1)
Sodium: 135 mmol/L (ref 135–145)

## 2015-10-03 LAB — CULTURE, BLOOD (ROUTINE X 2)
CULTURE: NO GROWTH
Culture: NO GROWTH

## 2015-10-03 LAB — URINALYSIS, ROUTINE W REFLEX MICROSCOPIC
BILIRUBIN URINE: NEGATIVE
GLUCOSE, UA: NEGATIVE mg/dL
Hgb urine dipstick: NEGATIVE
KETONES UR: NEGATIVE mg/dL
Leukocytes, UA: NEGATIVE
Nitrite: NEGATIVE
PH: 7 (ref 5.0–8.0)
Protein, ur: NEGATIVE mg/dL
SPECIFIC GRAVITY, URINE: 1.01 (ref 1.005–1.030)

## 2015-10-03 LAB — I-STAT TROPONIN, ED: TROPONIN I, POC: 0.02 ng/mL (ref 0.00–0.08)

## 2015-10-03 MED ORDER — GABAPENTIN 300 MG PO CAPS
300.0000 mg | ORAL_CAPSULE | Freq: Two times a day (BID) | ORAL | Status: DC
  Administered 2015-10-03 – 2015-10-05 (×4): 300 mg via ORAL
  Filled 2015-10-03 (×4): qty 1

## 2015-10-03 MED ORDER — HYDROMORPHONE HCL 1 MG/ML IJ SOLN: 0.5000 mg | INTRAMUSCULAR | Status: DC | PRN

## 2015-10-03 MED ORDER — ACETAMINOPHEN 650 MG RE SUPP: 650.0000 mg | Freq: Four times a day (QID) | RECTAL | Status: DC | PRN

## 2015-10-03 MED ORDER — ONDANSETRON HCL 4 MG PO TABS: 4.0000 mg | ORAL_TABLET | Freq: Four times a day (QID) | ORAL | Status: DC | PRN

## 2015-10-03 MED ORDER — ACETAMINOPHEN 325 MG PO TABS
650.0000 mg | ORAL_TABLET | Freq: Four times a day (QID) | ORAL | Status: DC | PRN
Start: 2015-10-03 — End: 2015-10-05

## 2015-10-03 MED ORDER — POTASSIUM CHLORIDE CRYS ER 20 MEQ PO TBCR
40.0000 meq | EXTENDED_RELEASE_TABLET | Freq: Once | ORAL | Status: AC
  Administered 2015-10-03: 40 meq via ORAL
  Filled 2015-10-03: qty 2

## 2015-10-03 MED ORDER — ONDANSETRON HCL 4 MG/2ML IJ SOLN: 4.0000 mg | Freq: Four times a day (QID) | INTRAMUSCULAR | Status: DC | PRN

## 2015-10-03 MED ORDER — POTASSIUM CHLORIDE 10 MEQ/100ML IV SOLN
10.0000 meq | Freq: Once | INTRAVENOUS | Status: AC
  Administered 2015-10-03: 10 meq via INTRAVENOUS
  Filled 2015-10-03: qty 100

## 2015-10-03 MED ORDER — SIMVASTATIN 40 MG PO TABS
40.0000 mg | ORAL_TABLET | Freq: Every day | ORAL | Status: DC
  Administered 2015-10-04: 40 mg via ORAL
  Filled 2015-10-03: qty 1

## 2015-10-03 MED ORDER — ALUM & MAG HYDROXIDE-SIMETH 200-200-20 MG/5ML PO SUSP: 30.0000 mL | Freq: Four times a day (QID) | ORAL | Status: DC | PRN

## 2015-10-03 MED ORDER — SPIRONOLACTONE 25 MG PO TABS
25.0000 mg | ORAL_TABLET | Freq: Every day | ORAL | Status: DC
  Administered 2015-10-04 – 2015-10-05 (×2): 25 mg via ORAL
  Filled 2015-10-03 (×2): qty 1

## 2015-10-03 MED ORDER — SERTRALINE HCL 100 MG PO TABS
100.0000 mg | ORAL_TABLET | Freq: Every day | ORAL | Status: DC
  Administered 2015-10-03 – 2015-10-04 (×2): 100 mg via ORAL
  Filled 2015-10-03 (×2): qty 1

## 2015-10-03 MED ORDER — CALCIUM CARBONATE-VITAMIN D 500-200 MG-UNIT PO TABS
2.0000 | ORAL_TABLET | Freq: Every day | ORAL | Status: DC
Start: 2015-10-04 — End: 2015-10-05
  Administered 2015-10-04 – 2015-10-05 (×2): 2 via ORAL
  Filled 2015-10-03: qty 2
  Filled 2015-10-03: qty 1
  Filled 2015-10-03: qty 2

## 2015-10-03 MED ORDER — PANTOPRAZOLE SODIUM 40 MG PO TBEC: 40.0000 mg | DELAYED_RELEASE_TABLET | Freq: Every day | ORAL | Status: DC | PRN

## 2015-10-03 MED ORDER — SODIUM CHLORIDE 0.9% FLUSH
3.0000 mL | Freq: Two times a day (BID) | INTRAVENOUS | Status: DC
  Administered 2015-10-03 – 2015-10-05 (×3): 3 mL via INTRAVENOUS

## 2015-10-03 MED ORDER — OMEPRAZOLE MAGNESIUM 20 MG PO TBEC: 20.0000 mg | DELAYED_RELEASE_TABLET | Freq: Every day | ORAL | Status: DC | PRN

## 2015-10-03 MED ORDER — RIVAROXABAN 20 MG PO TABS
20.0000 mg | ORAL_TABLET | Freq: Every day | ORAL | Status: DC
  Administered 2015-10-03 – 2015-10-04 (×2): 20 mg via ORAL
  Filled 2015-10-03 (×2): qty 1

## 2015-10-03 MED ORDER — NITROGLYCERIN 0.4 MG SL SUBL: 0.4000 mg | SUBLINGUAL_TABLET | SUBLINGUAL | Status: DC | PRN

## 2015-10-03 MED ORDER — SODIUM CHLORIDE 0.9 % IV SOLN
INTRAVENOUS | Status: AC
  Administered 2015-10-03: 23:00:00 via INTRAVENOUS

## 2015-10-03 MED ORDER — METOPROLOL TARTRATE 50 MG PO TABS
50.0000 mg | ORAL_TABLET | Freq: Two times a day (BID) | ORAL | Status: DC
  Administered 2015-10-03 – 2015-10-05 (×4): 50 mg via ORAL
  Filled 2015-10-03 (×4): qty 1

## 2015-10-03 MED ORDER — LORATADINE 10 MG PO TABS
10.0000 mg | ORAL_TABLET | Freq: Every day | ORAL | Status: DC
  Administered 2015-10-04 – 2015-10-05 (×2): 10 mg via ORAL
  Filled 2015-10-03 (×2): qty 1

## 2015-10-03 NOTE — Progress Notes (Signed)
Report received from Alda Ponder, RN from ED. Pt is to be admitted to 5W32. Awaiting pt's arrival.

## 2015-10-03 NOTE — H&P (Signed)
[facility] Admission History and Physical       [name] WUJ:[id number] DOB: Dec 20, 1940 DOA: 10/03/2015  Referring physician: EDP PCP: Astrid Divine, MD  Specialists:   Chief Complaint: Weakness  HPI: [name] is a 75 y.o. female with a history of Diastolic CHF, HTN, Mital Valve Disease, Pulmonary HTN who presents to the ED with complaints of increased weakness and fatigue since discharge from the hospital 3 days ago for CHF.  Her medications had been changed and adjusted.  She was evaluated in the ED and was found to have Hypokalemia of 2.4, and an elevated BUN/Cr of 49/1.33.     Review of Systems:  Constitutional: No Weight Loss, No Weight Gain, Night Sweats, Fevers, Chills, Dizziness, Light Headedness, +Fatigue, +Generalized Weakness HEENT: No Headaches, Difficulty Swallowing,Tooth/Dental Problems,Sore Throat,  No Sneezing, Rhinitis, Ear Ache, Nasal Congestion, or Post Nasal Drip,  Cardio-vascular:  No Chest pain, Orthopnea, PND, Edema in Lower Extremities, Anasarca, Dizziness, Palpitations  Resp: No Dyspnea, No DOE, No Productive Cough, No Non-Productive Cough, No Hemoptysis, No Wheezing.    GI: No Heartburn, Indigestion, Abdominal Pain, Nausea, Vomiting, Diarrhea, Constipation, Hematemesis, Hematochezia, Melena, Change in Bowel Habits,  Loss of Appetite  GU: No Dysuria, No Change in Color of Urine, No Urgency or Urinary Frequency, No Flank pain.  Musculoskeletal: No Joint Pain or Swelling, No Decreased Range of Motion, No Back Pain.  Neurologic: No Syncope, No Seizures, Muscle Weakness, Paresthesia, Vision Disturbance or Loss, No Diplopia, No Vertigo, No Difficulty Walking,  Skin: No Rash or Lesions. Psych: No Change in Mood or Affect, No Depression or Anxiety, No Memory loss, No Confusion, or Hallucinations   Past Medical History  Diagnosis Date  . COPD (chronic obstructive pulmonary disease) (HCC)   . Pulmonary HTN (HCC)     PASP 50-13mmHg by  [id number]  . History of rheumatic heart disease     X 3-LAST TIME WHEN PATIENT WAS 75 YRS OLD  . Sleep apnea     intolerant to CPAP  . Hypertension   . Bursitis, shoulder     bilateral  . GERD (gastroesophageal reflux disease)   . H/O hiatal hernia   . Anxiety   . Depression   . Varicose veins   . PUD (peptic ulcer disease)   . 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 DJD (degenerative joint disease)   . OA (osteoarthritis)   . Hyperlipidemia   . Urinary incontinence   . Pulmonary HTN (HCC)   . Thyroid nodule   . CHF (congestive heart failure) (HCC)     chronic diasotlic CHF  . Complication of anesthesia     "have trouble getting me awake sometimes; been ok latelhy" (09/24/2015)  . On home oxygen therapy     "2L; 24/7" (09/24/2015)     Past Surgical History  Procedure Laterality Date  . Bilateral salpingoophorectomy    . Vaginal delivery  x4  . Thyroidectomy, partial  2010    Dr Michaell Cowing  . Tubal ligation    . Total abdominal hysterectomy  1990    1990  . Coronary artery bypass graft  06/21/2000    x4  . Cardiac catheterization  02/11/2008, 01/30/2004, 06/14/2000  . Incontinence surgery      "tacked"  . Cardiac catheterization N/A 09/24/2015    Procedure: Right Heart Cath;  Surgeon: Laurey Morale, MD;  Location: [facility] INVASIVE CV LAB;  Service: Cardiovascular;  Laterality: N/A;  . Tee without cardioversion N/A 09/24/2015  Procedure: TRANSESOPHAGEAL ECHOCARDIOGRAM (TEE);  Surgeon: Laurey Morale, MD;  Location: [facility] ENDOSCOPY;  Service: Cardiovascular;  Laterality: N/A;      Prior to Admission medications   Medication Sig Start Date End Date Taking? Authorizing Provider  acetaminophen (TYLENOL) 500 MG tablet Take 1,000 mg by mouth daily as needed for headache.    Historical Provider, MD  alendronate (FOSAMAX) 70 MG tablet Take 70 mg by mouth once a week. 09/18/15   Historical Provider, MD  calcium-vitamin D (OSCAL WITH D) 500-200 MG-UNIT per tablet Take 2 tablets by mouth  daily with breakfast.    Historical Provider, MD  gabapentin (NEURONTIN) 300 MG capsule Take 300 mg by mouth 2 (two) times daily. 07/28/15   Historical Provider, MD  loratadine (CLARITIN) 10 MG tablet Take 10 mg by mouth daily.    Historical Provider, MD  metoprolol (LOPRESSOR) 50 MG tablet Take 1 tablet by mouth two  times daily 08/10/15   Quintella Reichert, MD  nitroGLYCERIN (NITROSTAT) 0.4 MG SL tablet Place 1 tablet (0.4 mg total) under the tongue every 5 (five) minutes as needed for chest pain. 12/23/13   Quintella Reichert, MD  omeprazole (PRILOSEC OTC) 20 MG tablet Take 20 mg by mouth daily as needed (GERD).     Historical Provider, MD  OXYGEN Inhale 2 L into the lungs continuous.    Historical Provider, MD  potassium chloride SA (K-DUR,KLOR-CON) 20 MEQ tablet Take 1-2 tablets (20-40 mEq total) by mouth as directed. Take 2 tablets (40 meq) every morning, and 1 tablet (20 meq) every evening. 09/30/15   Graciella Freer, PA-C  rivaroxaban (XARELTO) 20 MG TABS tablet Take 1 tablet (20 mg total) by mouth at bedtime. 02/16/15   Quintella Reichert, MD  sertraline (ZOLOFT) 100 MG tablet Take 100 mg by mouth at bedtime.     Historical Provider, MD  simvastatin (ZOCOR) 40 MG tablet Take 1 tablet by mouth  every evening 08/10/15   Quintella Reichert, MD  spironolactone (ALDACTONE) 25 MG tablet Take 1 tablet (25 mg total) by mouth daily. 09/30/15   Graciella Freer, PA-C  torsemide (DEMADEX) 20 MG tablet Take 2 tablets (40 mg total) by mouth 2 (two) times daily. 09/30/15   Graciella Freer, PA-C     Allergies  Allergen Reactions  . Prednisone Rash    jittery  . Xopenex [Levalbuterol]     Made mouth and throat sore  . Cefpodoxime Other (See Comments)    Yeast infections   . Clindamycin Other (See Comments)    Unknown   . Levalbuterol Hcl Other (See Comments)    Unknown   . Lipitor [Atorvastatin]     Other reaction(s): Other (See Comments) Made joint start hurting  Muscle aches  . Other Swelling     Venholin HFA  . Penicillins Other (See Comments)    Unknown, childhood reaction   . Tape Other (See Comments)    Bleeding  . Ventolin [Kdc:Albuterol]     "jittery"  . Codeine Other (See Comments)    anxious jittery  . Hydrocodone-Acetaminophen Anxiety  . Latex Itching and Rash    Social History:  reports that she quit smoking about 8 years ago. Her smoking use included Cigarettes. She has a 47 pack-year smoking history. She has never used smokeless tobacco. She reports that she does not drink alcohol or use illicit drugs.    Family History  Problem Relation Age of Onset  . Anemia Mother   . Emphysema  Father   . Esophageal cancer Daughter        Physical Exam:  GEN:  Pleasant Elderly Well Nourished and Well Developed 75 y.o. Caucasian female examined and in no acute distress; cooperative with exam Filed Vitals:   10/03/15 1725 10/03/15 1757 10/03/15 1800 10/03/15 1830  BP: 131/58 156/58 107/83 147/48  Pulse: 61 72 71 67  Temp:      TempSrc:      Resp: SpO2: 92% 97% 96% 99%   Blood pressure 147/48, pulse 67, temperature 98.3 F (36.8 C), temperature source Oral, resp. rate 19, SpO2 99 %. PSYCH: She is alert and oriented x4; does not appear anxious does not appear depressed; affect is normal HEENT: Normocephalic and Atraumatic, Mucous membranes pink; PERRLA; EOM intact; Fundi:  Benign;  No scleral icterus, Nares: Patent, Oropharynx: Clear, Edentulous or Fair Dentition,    Neck:  FROM, No Cervical Lymphadenopathy nor Thyromegaly or Carotid Bruit; No JVD; Breasts:: Not examined CHEST WALL: No tenderness CHEST: Normal respiration, clear to auscultation bilaterally HEART: Regular rate and rhythm; no murmurs rubs or gallops BACK: No kyphosis or scoliosis; No CVA tenderness ABDOMEN: Positive Bowel Sounds, Soft Non-Tender, No Rebound or Guarding; No Masses, No Organomegaly. Rectal Exam: Not done EXTREMITIES: No Cyanosis, Clubbing, or Edema; No  Ulcerations. Genitalia: not examined PULSES: 2+ and symmetric SKIN: Normal hydration no rash or ulceration CNS:  Alert and Oriented x 4, No Focal Deficits Vascular: pulses palpable throughout    Labs on Admission:  Basic Metabolic Panel:  Recent Labs Lab 09/28/15 1300 09/29/15 0428 09/29/15 1442 09/30/15 0400 10/03/15 1614  NA 141 141 139 142 135  K 3.4* 3.0* 3.0* 4.0 2.4*  CL 83* 78* 79* 80* 71*  CO2 45* >50* 44* 46* 47*  GLUCOSE 142* 116* 111* 124* 159*  BUN 23* 29* 32* 36* 49*  CREATININE 1.09* 1.12* 1.19* 1.11* 1.33*  CALCIUM 9.9 10.0 10.3 10.2 9.8  MG 1.8  --   --   --   --    Liver Function Tests: No results for input(s): AST, ALT, ALKPHOS, BILITOT, PROT, ALBUMIN in the last 168 hours. No results for input(s): LIPASE, AMYLASE in the last 168 hours. No results for input(s): AMMONIA in the last 168 hours. CBC:  Recent Labs Lab 09/27/15 0601 09/28/15 0407 09/29/15 0428 09/30/15 0400 10/03/15 1614  WBC 7.7 9.7 9.7 10.2 8.2  NEUTROABS 5.1 5.7 6.0 6.1  --   HGB 9.2* 10.0* 10.9* 11.6* 10.5*  HCT 33.1* 33.8* 38.0 39.3 36.7  MCV 88.0 86.7 86.2 86.4 86.2  PLT 198 211 248 267 263   Cardiac Enzymes: No results for input(s): CKTOTAL, CKMB, CKMBINDEX, TROPONINI in the last 168 hours.  BNP (last 3 results)  Recent Labs  09/14/15 1519 09/24/15 1605 09/29/15 0428  BNP 208.5* 436.5* 165.4*    ProBNP (last 3 results)  Recent Labs  01/09/15 0907  PROBNP 245.0*    CBG: No results for input(s): GLUCAP in the last 168 hours.  Radiological Exams on Admission: No results found.   EKG: Independently reviewed. Sinus Rhythm Rate = 68,  +RBB, +LAFB       Assessment/Plan:      75 y.o. female with  Principal Problem:    Hypokalemia    Hold Torsemide Rx    Replaced K+ IV and PO     Adjust Daily dose of K+   Active Problems:    AKI (acute kidney injury) (HCC)    Gentle IVFs  Monitor BUN/Cr    Coronary atherosclerosis    Stable on Metoprolol, and  Simvastatin Rx      COPD (chronic obstructive pulmonary disease) with emphysema (HCC)    stable      Chronic diastolic heart failure (HCC)    On Torsemide, Spironolactone and Metoprolol Rx     Holding Torsemide overnight      Chronic anticoagulation    On Eliquis Rx     HTN (hypertension)    Continue Metoprolol and Spironolactone Rx    Hold Torsemide overnight    DVT Prophylaxis      On Eliquis Rx    Code Status:     DO NOT RESUSCITATE (DNR)      Family Communication:   Family at Bedside   No Family Present    Disposition Plan:    Inpatient Status        Time spent: 66 Minutes      Ron Parker [facility] Pager [phone]   If 7AM -7PM Please Contact the Day Rounding Team MD for [facility]  If 7PM-7AM, Please Contact Night-Floor Coverage  [email or web address] Password [facility] 10/03/2015, 7:35 PM     ADDENDUM:   Patient was seen and examined on 10/03/2015

## 2015-10-03 NOTE — Progress Notes (Signed)
NURSING PROGRESS NOTE  [name] [id number] Admission Data: 10/03/2015 9:08 PM Attending Provider: Ron Parker, MD YDX:AJOINOM,VEHMCN Thomasena Edis, MD Code Status: full   [name] is a 75 y.o. female patient admitted from ED:  -No acute distress noted.  -No complaints of shortness of breath.  -No complaints of chest pain.   Cardiac Monitoring: Box # 7 in place. Cardiac monitor yields:normal sinus rhythm.  Blood pressure 135/55, pulse 73, temperature 98.2 F (36.8 C), temperature source Oral, resp. rate 19, SpO2 95 %.   IV Fluids:  IV in place, occlusive dsg intact without redness, IV cath hand left, condition patent and no redness normal saline.   Allergies:  Prednisone; Xopenex; Cefpodoxime; Clindamycin; Levalbuterol hcl; Lipitor; Other; Penicillins; Tape; Ventolin; Codeine; Hydrocodone-acetaminophen; and Latex  Past Medical History:   has a past medical history of COPD (chronic obstructive pulmonary disease) (HCC); Pulmonary HTN (HCC); History of rheumatic heart disease; Sleep apnea; Hypertension; Bursitis, shoulder; GERD (gastroesophageal reflux disease); H/O hiatal hernia; Anxiety; Depression; Varicose veins; PUD (peptic ulcer disease); Coronary heart disease (2001); DJD (degenerative joint disease); OA (osteoarthritis); Hyperlipidemia; Urinary incontinence; Pulmonary HTN (HCC); Thyroid nodule; CHF (congestive heart failure) (HCC); Complication of anesthesia; and On home oxygen therapy.  Past Surgical History:   has past surgical history that includes Bilateral salpingoophorectomy; Vaginal delivery (x4); Thyroidectomy, partial (2010); Tubal ligation; Total abdominal hysterectomy (1990); Coronary artery bypass graft (06/21/2000); Cardiac catheterization (02/11/2008, 01/30/2004, 06/14/2000); Incontinence surgery; Cardiac catheterization (N/A, 09/24/2015); and TEE without cardioversion (N/A, 09/24/2015).  Social History:   reports that she quit smoking about 8 years ago. Her smoking  use included Cigarettes. She has a 47 pack-year smoking history. She has never used smokeless tobacco. She reports that she does not drink alcohol or use illicit drugs.  Skin: Intact  Patient/Family orientated to room. Information packet given to patient/family. Admission inpatient armband information verified with patient/family to include name and date of birth and placed on patient arm. Side rails up x 3, fall assessment and education completed with patient/family. Patient/family able to verbalize understanding of risk associated with falls and verbalized understanding to call for assistance before getting out of bed. Call light within reach. Patient/family able to voice and demonstrate understanding of unit orientation instructions.

## 2015-10-03 NOTE — Telephone Encounter (Signed)
Pt was contacted by Efraim Kaufmann at [facility].   She was discharged on 09/30/2015 after an admission for heart failure.  She is compliant with her medications and low sodium diet. She is trying not to drink too much water. However, she developed cramping and drawing in her hands and arms and legs. She has had these symptoms before when her potassium was low.  She also noted that when she pulled up the skin on the back of her hands, it would stick together in stable therapy. She wonders if she is dehydrated.  Advised her that she should take 2 extra potassium tablets today and hold her evening Demadex. I advised her that it was okay to drink a little extra water, but I did not want her to push fluids because then she would get overloaded. She understands. We will try to get in touch with [place] [facility] to get a BMET drawn either tomorrow or Monday. If her symptoms do not improve tomorrow she will need to come to the emergency room to get blood work done and possible additional supplementation.   Ms Bemus is agreeable with this plan.  Leanna Battles 10/03/2015 12:36 PM Beeper [phone]

## 2015-10-03 NOTE — ED Provider Notes (Signed)
CSN: [id number]     Arrival date & time 10/03/15  1517 History   First MD Initiated Contact with Patient 10/03/15 1607     Chief Complaint  Patient presents with  . Fatigue     (Consider location/radiation/quality/duration/timing/severity/associated sxs/prior Treatment) HPI  Pt presenting with c/o fatigue and hand cramping.  Pt was discharged 3 days ago from cardiology service - she was diuresed and potassium was repleted during that admission.  Also had workup for possible endocarditis- her blood cultures were negative.  Pt has had increased lasix since time of discharge, has also been taking twice daily potassium supplement.  She states she has been very weak- having difficulty getting up and going to the bathroom.  Also noted that today her hands were cramping.  soen states she has been very shaky and weak.  No fever/chills.  No chest pain or difficulty breathing.  No fainting.  She states she has lost one pound since discharge 3 days ago, has had increased frequency of urination.  There are no other associated systemic symptoms, there are no other alleviating or modifying factors.   Past Medical History  Diagnosis Date  . COPD (chronic obstructive pulmonary disease) (HCC)   . Pulmonary HTN (HCC)     PASP 50-110mmHg by [id number]  . History of rheumatic heart disease     X 3-LAST TIME WHEN PATIENT WAS 75 YRS OLD  . Sleep apnea     intolerant to CPAP  . Hypertension   . Bursitis, shoulder     bilateral  . GERD (gastroesophageal reflux disease)   . H/O hiatal hernia   . Anxiety   . Depression   . Varicose veins   . PUD (peptic ulcer disease)   . 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 DJD (degenerative joint disease)   . OA (osteoarthritis)   . Hyperlipidemia   . Urinary incontinence   . Pulmonary HTN (HCC)   . Thyroid nodule   . CHF (congestive heart failure) (HCC)     chronic diasotlic CHF  . Complication of anesthesia     "have trouble getting me awake sometimes; been  ok latelhy" (09/24/2015)  . On home oxygen therapy     "2L; 24/7" (09/24/2015)   Past Surgical History  Procedure Laterality Date  . Bilateral salpingoophorectomy    . Vaginal delivery  x4  . Thyroidectomy, partial  2010    Dr Michaell Cowing  . Tubal ligation    . Total abdominal hysterectomy  1990    1990  . Coronary artery bypass graft  06/21/2000    x4  . Cardiac catheterization  02/11/2008, 01/30/2004, 06/14/2000  . Incontinence surgery      "tacked"  . Cardiac catheterization N/A 09/24/2015    Procedure: Right Heart Cath;  Surgeon: Laurey Morale, MD;  [facility] INVASIVE CV LAB;  Service: Cardiovascular;  Laterality: N/A;  . Tee without cardioversion N/A 09/24/2015    Procedure: TRANSESOPHAGEAL ECHOCARDIOGRAM (TEE);  Surgeon: Laurey Morale, MD;  Location: [facility] ENDOSCOPY;  Service: Cardiovascular;  Laterality: N/A;   Family History  Problem Relation Age of Onset  . Anemia Mother   . Emphysema Father   . Esophageal cancer Daughter    Social History  Substance Use Topics  . Smoking status: Former Smoker -- 1.00 packs/day for 47 years    Types: Cigarettes    Quit date: 07/26/2007  . Smokeless tobacco: Never Used  . Alcohol Use: No   OB History  No data available     Review of Systems  ROS reviewed and all otherwise negative except for mentioned in HPI    Allergies  Prednisone; Xopenex; Cefpodoxime; Clindamycin; Levalbuterol hcl; Lipitor; Other; Penicillins; Tape; Ventolin; Codeine; Hydrocodone-acetaminophen; and Latex  Home Medications   Prior to Admission medications   Medication Sig Start Date End Date Taking? Authorizing Provider  acetaminophen (TYLENOL) 500 MG tablet Take 1,000 mg by mouth daily as needed for headache.    Historical Provider, MD  alendronate (FOSAMAX) 70 MG tablet Take 70 mg by mouth once a week. 09/18/15   Historical Provider, MD  calcium-vitamin D (OSCAL WITH D) 500-200 MG-UNIT per tablet Take 2 tablets by mouth daily with breakfast.    Historical  Provider, MD  gabapentin (NEURONTIN) 300 MG capsule Take 300 mg by mouth 2 (two) times daily. 07/28/15   Historical Provider, MD  loratadine (CLARITIN) 10 MG tablet Take 10 mg by mouth daily.    Historical Provider, MD  metoprolol (LOPRESSOR) 50 MG tablet Take 1 tablet by mouth two  times daily 08/10/15   Quintella Reichert, MD  nitroGLYCERIN (NITROSTAT) 0.4 MG SL tablet Place 1 tablet (0.4 mg total) under the tongue every 5 (five) minutes as needed for chest pain. 12/23/13   Quintella Reichert, MD  omeprazole (PRILOSEC OTC) 20 MG tablet Take 20 mg by mouth daily as needed (GERD).     Historical Provider, MD  OXYGEN Inhale 2 L into the lungs continuous.    Historical Provider, MD  potassium chloride SA (K-DUR,KLOR-CON) 20 MEQ tablet Take 1-2 tablets (20-40 mEq total) by mouth as directed. Take 2 tablets (40 meq) every morning, and 1 tablet (20 meq) every evening. 09/30/15   Graciella Freer, PA-C  rivaroxaban (XARELTO) 20 MG TABS tablet Take 1 tablet (20 mg total) by mouth at bedtime. 02/16/15   Quintella Reichert, MD  sertraline (ZOLOFT) 100 MG tablet Take 100 mg by mouth at bedtime.     Historical Provider, MD  simvastatin (ZOCOR) 40 MG tablet Take 1 tablet by mouth  every evening 08/10/15   Quintella Reichert, MD  spironolactone (ALDACTONE) 25 MG tablet Take 1 tablet (25 mg total) by mouth daily. 09/30/15   Graciella Freer, PA-C  torsemide (DEMADEX) 20 MG tablet Take 2 tablets (40 mg total) by mouth 2 (two) times daily. 09/30/15   Graciella Freer, PA-C   BP 122/40 mmHg  Pulse 71  Temp(Src) 98.2 F (36.8 C) (Oral)  Resp 19  Ht 5\' 4"  (1.626 m)  Wt 81.4 kg  BMI 30.79 kg/m2  SpO2 95%  Vitals reviewed Physical Exam  Physical Examination: General appearance - alert, well appearing, and in no distress Mental status - alert, oriented to person, place, and time Eyes - no conjunctival injection, no scleral icterus Mouth - mucous membranes moist, pharynx normal without lesions Chest - clear to  auscultation, no wheezes, rales or rhonchi, symmetric air entry Heart - normal rate, regular rhythm, normal S1, S2, no murmurs, rubs, clicks or gallops Abdomen - soft, nontender, nondistended, no masses or organomegaly Neurological - alert, oriented, normal speech, strength 5/5 in extremities x 4 Extremities - peripheral pulses normal, no pedal edema, no clubbing or cyanosis Skin - normal coloration and turgor, no rashes  ED Course  Procedures (including critical care time)  .edcritical Labs Review Labs Reviewed  BASIC METABOLIC PANEL - Abnormal; Notable for the following:    Potassium 2.4 (*)    Chloride 71 (*)  CO2 47 (*)    Glucose, Bld 159 (*)    BUN 49 (*)    Creatinine, Ser 1.33 (*)    GFR calc non Af Amer 38 (*)    GFR calc Af Amer 44 (*)    Anion gap 17 (*)    All other components within normal limits  CBC - Abnormal; Notable for the following:    Hemoglobin 10.5 (*)    MCH 24.6 (*)    MCHC 28.6 (*)    All other components within normal limits  URINALYSIS, ROUTINE W REFLEX MICROSCOPIC (NOT AT [facility])  BASIC METABOLIC PANEL  CBC  I-STAT TROPOININ, ED    Imaging Review No results found. I have personally reviewed and evaluated these images and lab results as part of my medical decision-making.   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  Today her potassium is low at 2.4- which is the likely cause of her symptoms.  This is despite taking potassium supplements at home.  Pt treated with IV and po potassium in the ED.    6:51 PM d/w cardiology - he recommends admission to hospitalist service for electrolyte management  7:34 PM d/w Dr. Lovell Sheehan, [facility], pt to be admitted to telemetry bed.    Jerelyn Scott, MD 10/04/15 [phone]

## 2015-10-03 NOTE — ED Notes (Signed)
Chestine Spore (Daughter): [phone]

## 2015-10-03 NOTE — ED Notes (Signed)
Per EMS, pt coming in for increased fatigue since d/c from hospital on Wednesday. Pt reports they increased her Lasix and Potasium. Pt alert x4. NAD at this time. VSS in route. CBG:142.

## 2015-10-04 LAB — MAGNESIUM: Magnesium: 2.3 mg/dL (ref 1.7–2.4)

## 2015-10-04 LAB — BASIC METABOLIC PANEL
BUN: 41 mg/dL — ABNORMAL HIGH (ref 6–20)
CHLORIDE: 80 mmol/L — AB (ref 101–111)
CO2: >50 mmol/L — ABNORMAL HIGH (ref 22–32)
Calcium: 9.5 mg/dL (ref 8.9–10.3)
Creatinine, Ser: 1.2 mg/dL — ABNORMAL HIGH (ref 0.44–1.00)
GFR, EST AFRICAN AMERICAN: 50 mL/min — AB (ref >60)
GFR, EST NON AFRICAN AMERICAN: 43 mL/min — AB (ref >60)
Glucose, Bld: 101 mg/dL — ABNORMAL HIGH (ref 65–99)
POTASSIUM: 3 mmol/L — AB (ref 3.5–5.1)
SODIUM: 143 mmol/L (ref 135–145)

## 2015-10-04 LAB — CBC
HEMATOCRIT: 34.2 % — AB (ref 36.0–46.0)
HEMOGLOBIN: 9.6 g/dL — AB (ref 12.0–15.0)
MCH: 24.8 pg — ABNORMAL LOW (ref 26.0–34.0)
MCHC: 28.1 g/dL — ABNORMAL LOW (ref 30.0–36.0)
MCV: 88.4 fL (ref 78.0–100.0)
Platelets: 236 10*3/uL (ref 150–400)
RBC: 3.87 MIL/uL (ref 3.87–5.11)
RDW: 15.1 % (ref 11.5–15.5)
WBC: 7.9 10*3/uL (ref 4.0–10.5)

## 2015-10-04 MED ORDER — POTASSIUM CHLORIDE CRYS ER 20 MEQ PO TBCR
40.0000 meq | EXTENDED_RELEASE_TABLET | Freq: Once | ORAL | Status: AC
  Administered 2015-10-04: 40 meq via ORAL
  Filled 2015-10-04: qty 2

## 2015-10-04 MED ORDER — POTASSIUM CHLORIDE CRYS ER 20 MEQ PO TBCR
40.0000 meq | EXTENDED_RELEASE_TABLET | Freq: Two times a day (BID) | ORAL | Status: DC
  Administered 2015-10-04 – 2015-10-05 (×3): 40 meq via ORAL
  Filled 2015-10-04 (×3): qty 2

## 2015-10-04 MED ORDER — POTASSIUM CHLORIDE 10 MEQ/100ML IV SOLN
10.0000 meq | INTRAVENOUS | Status: AC
  Administered 2015-10-04 (×4): 10 meq via INTRAVENOUS
  Filled 2015-10-04 (×4): qty 100

## 2015-10-04 NOTE — Progress Notes (Signed)
[facility] PROGRESS NOTE  [name] ZOX:[id number] DOB: [birth year] DOA: 10/03/2015 PCP: Astrid Divine, MD Interim summary: [name] is a 75 y.o. female with a history of Diastolic CHF, HTN, Mital Valve Disease, Pulmonary HTN who presents to the ED with complaints of increased weakness and fatigue since discharge from the hospital 3 days ago for CHF. Her medications had been changed and adjusted. She was evaluated in the ED and was found to have Hypokalemia of 2.4, and an elevated BUN/Cr of 49/1.33.   Assessment/Plan: 1. Hypokalemia: repelte as needed.  Holding torsemide for now. Plan to restart in am, once potassium improves.    Acute Kidney injury: - improving. Torsemide held on admission.  - plan to restart it once renal parameters are at baseline.  COPD: Stable.   Chronic stable diastolic heart failure: appears compensated.   Hypertension: Controlled.      Code Status: full code.  Family Communication: none at bedside.  Disposition Plan: home when potassium stabilizes   Consultants:  none  Procedures:  none  Antibiotics:  none  HPI/Subjective: Denies any new complaints.   Objective: Filed Vitals:   10/04/15 1027 10/04/15 1419  BP:  120/93  Pulse: 76 69  Temp:  97.6 F (36.4 C)  Resp:  16    Intake/Output Summary (Last 24 hours) at 10/04/15 1441 Last data filed at 10/04/15 1149  Gross per 24 hour  Intake    815 ml  Output    152 ml  Net    663 ml   Filed Weights   10/03/15 2105  Weight: 81.4 kg (179 lb 7.3 oz)    Exam:   General:  Alert comfortable sitting on the bed.   Cardiovascular: s1s2  Respiratory: ctab.   Abdomen: soft non tender non distended bowel sounds heard.   Musculoskeletal: trace pedal edema.   Data Reviewed: Basic Metabolic Panel:  Recent Labs Lab 09/28/15 1300 09/29/15 0428 09/29/15 1442 09/30/15 0400 10/03/15 1614 10/04/15 0707  NA 141 141 139 142 135 143  K 3.4* 3.0* 3.0*  4.0 2.4* 3.0*  CL 83* 78* 79* 80* 71* 80*  CO2 45* >50* 44* 46* 47* >50*  GLUCOSE 142* 116* 111* 124* 159* 101*  BUN 23* 29* 32* 36* 49* 41*  CREATININE 1.09* 1.12* 1.19* 1.11* 1.33* 1.20*  CALCIUM 9.9 10.0 10.3 10.2 9.8 9.5  MG 1.8  --   --   --   --  2.3   Liver Function Tests: No results for input(s): AST, ALT, ALKPHOS, BILITOT, PROT, ALBUMIN in the last 168 hours. No results for input(s): LIPASE, AMYLASE in the last 168 hours. No results for input(s): AMMONIA in the last 168 hours. CBC:  Recent Labs Lab 09/28/15 0407 09/29/15 0428 09/30/15 0400 10/03/15 1614 10/04/15 0707  WBC 9.7 9.7 10.2 8.2 7.9  NEUTROABS 5.7 6.0 6.1  --   --   HGB 10.0* 10.9* 11.6* 10.5* 9.6*  HCT 33.8* 38.0 39.3 36.7 34.2*  MCV 86.7 86.2 86.4 86.2 88.4  PLT 211 248 267 263 236   Cardiac Enzymes: No results for input(s): CKTOTAL, CKMB, CKMBINDEX, TROPONINI in the last 168 hours. BNP (last 3 results)  Recent Labs  09/14/15 1519 09/24/15 1605 09/29/15 0428  BNP 208.5* 436.5* 165.4*    ProBNP (last 3 results)  Recent Labs  01/09/15 0907  PROBNP 245.0*    CBG: No results for input(s): GLUCAP in the last 168 hours.  Recent Results (from the past 240 hour(s))  Culture, blood (  routine x 2)     Status: None   Collection Time: 09/24/15  3:55 PM  Result Value Ref Range Status   Specimen Description BLOOD RIGHT ARM  Final   Special Requests BOTTLES DRAWN AEROBIC ONLY 1.6ML  Final   Culture  Setup Time   Final    GRAM POSITIVE COCCI IN CLUSTERS IN PEDIATRIC BOTTLE CRITICAL RESULT CALLED TO, READ BACK BY AND VERIFIED WITH: J ODDONO,RN AT 1306 09/25/15 BY L BENFIELD    Culture   Final    STAPHYLOCOCCUS SPECIES (COAGULASE NEGATIVE) THE SIGNIFICANCE OF ISOLATING THIS ORGANISM FROM A SINGLE SET OF BLOOD CULTURES WHEN MULTIPLE SETS ARE DRAWN IS UNCERTAIN. PLEASE NOTIFY THE MICROBIOLOGY DEPARTMENT WITHIN ONE WEEK IF SPECIATION AND SENSITIVITIES ARE REQUIRED.    Report Status 09/27/2015 FINAL   Final  Culture, blood (routine x 2)     Status: None   Collection Time: 09/24/15  4:03 PM  Result Value Ref Range Status   Specimen Description BLOOD LEFT HAND  Final   Special Requests BOTTLES DRAWN AEROBIC ONLY  Final   Culture NO GROWTH 5 DAYS  Final   Report Status 09/29/2015 FINAL  Final  Culture, blood (routine x 2)     Status: None   Collection Time: 09/26/15  5:30 AM  Result Value Ref Range Status   Specimen Description BLOOD LEFT ANTECUBITAL  Final   Special Requests BOTTLES DRAWN AEROBIC AND ANAEROBIC 8CC  Final   Culture NO GROWTH 5 DAYS  Final   Report Status 10/01/2015 FINAL  Final  Culture, blood (routine x 2)     Status: None   Collection Time: 09/26/15  5:40 AM  Result Value Ref Range Status   Specimen Description BLOOD LEFT HAND  Final   Special Requests BOTTLES DRAWN AEROBIC AND ANAEROBIC 8CC  Final   Culture NO GROWTH 5 DAYS  Final   Report Status 10/01/2015 FINAL  Final  Culture, blood (routine x 2)     Status: None   Collection Time: 09/28/15  1:00 PM  Result Value Ref Range Status   Specimen Description BLOOD LEFT ARM  Final   Special Requests BOTTLES DRAWN AEROBIC AND ANAEROBIC 10 CC  Final   Culture NO GROWTH 5 DAYS  Final   Report Status 10/03/2015 FINAL  Final  Culture, blood (routine x 2)     Status: None   Collection Time: 09/28/15  1:05 PM  Result Value Ref Range Status   Specimen Description BLOOD RIGHT ARM  Final   Special Requests BOTTLES DRAWN AEROBIC ONLY 10 CC  Final   Culture NO GROWTH 5 DAYS  Final   Report Status 10/03/2015 FINAL  Final     Studies: No results found.  Scheduled Meds: . calcium-vitamin D  2 tablet Oral Q breakfast  . gabapentin  300 mg Oral BID  . loratadine  10 mg Oral Daily  . metoprolol  50 mg Oral BID  . potassium chloride  40 mEq Oral BID  . rivaroxaban  20 mg Oral Q supper  . sertraline  100 mg Oral QHS  . simvastatin  40 mg Oral q1800  . sodium chloride flush  3 mL Intravenous Q12H  . spironolactone   25 mg Oral Daily   Continuous Infusions:   Principal Problem:   Hypokalemia Active Problems:   Coronary atherosclerosis   COPD (chronic obstructive pulmonary disease) with emphysema (HCC)   Chronic diastolic heart failure (HCC)   Chronic anticoagulation   HTN (hypertension)  AKI (acute kidney injury) (HCC)   Weakness    Time spent: 25 minutes.     [facility]  [facility] Pager [phone] If 7PM-7AM, please contact night-coverage at [email or web address], password [facility] 10/04/2015, 2:41 PM  LOS: 1 day

## 2015-10-05 DIAGNOSIS — Z7901 Long term (current) use of anticoagulants: Secondary | ICD-10-CM

## 2015-10-05 DIAGNOSIS — E876 Hypokalemia: Secondary | ICD-10-CM

## 2015-10-05 DIAGNOSIS — I5032 Chronic diastolic (congestive) heart failure: Secondary | ICD-10-CM

## 2015-10-05 DIAGNOSIS — N179 Acute kidney failure, unspecified: Principal | ICD-10-CM

## 2015-10-05 LAB — BASIC METABOLIC PANEL
Anion gap: 12 (ref 5–15)
BUN: 24 mg/dL — AB (ref 6–20)
CALCIUM: 9.8 mg/dL (ref 8.9–10.3)
CHLORIDE: 87 mmol/L — AB (ref 101–111)
CO2: 43 mmol/L — AB (ref 22–32)
CREATININE: 0.89 mg/dL (ref 0.44–1.00)
GFR calc non Af Amer: >60 mL/min (ref >60)
GLUCOSE: 97 mg/dL (ref 65–99)
Potassium: 3.8 mmol/L (ref 3.5–5.1)
Sodium: 142 mmol/L (ref 135–145)

## 2015-10-05 LAB — RETICULOCYTES
RBC.: 3.91 MIL/uL (ref 3.87–5.11)
RETIC COUNT ABSOLUTE: 66.5 10*3/uL (ref 19.0–186.0)
Retic Ct Pct: 1.7 % (ref 0.4–3.1)

## 2015-10-05 LAB — CBC
HCT: 35 % — ABNORMAL LOW (ref 36.0–46.0)
Hemoglobin: 9.8 g/dL — ABNORMAL LOW (ref 12.0–15.0)
MCH: 24.9 pg — AB (ref 26.0–34.0)
MCHC: 28 g/dL — AB (ref 30.0–36.0)
MCV: 89.1 fL (ref 78.0–100.0)
Platelets: 245 10*3/uL (ref 150–400)
RBC: 3.93 MIL/uL (ref 3.87–5.11)
RDW: 15 % (ref 11.5–15.5)
WBC: 7.6 10*3/uL (ref 4.0–10.5)

## 2015-10-05 LAB — FERRITIN: Ferritin: 16 ng/mL (ref 11–307)

## 2015-10-05 LAB — FOLATE: FOLATE: 11.9 ng/mL (ref >5.9)

## 2015-10-05 LAB — VITAMIN B12: Vitamin B-12: 387 pg/mL (ref 180–914)

## 2015-10-05 LAB — IRON AND TIBC
IRON: 52 ug/dL (ref 28–170)
Saturation Ratios: 10 % — ABNORMAL LOW (ref 10.4–31.8)
TIBC: 542 ug/dL — ABNORMAL HIGH (ref 250–450)
UIBC: 490 ug/dL

## 2015-10-05 MED ORDER — TORSEMIDE 20 MG PO TABS: 40.0000 mg | ORAL_TABLET | Freq: Every day | ORAL | Status: DC

## 2015-10-05 MED ORDER — POTASSIUM CHLORIDE CRYS ER 20 MEQ PO TBCR: 40.0000 meq | EXTENDED_RELEASE_TABLET | Freq: Two times a day (BID) | ORAL | Status: DC

## 2015-10-05 MED ORDER — POTASSIUM CHLORIDE CRYS ER 20 MEQ PO TBCR
40.0000 meq | EXTENDED_RELEASE_TABLET | Freq: Three times a day (TID) | ORAL | Status: DC
  Administered 2015-10-05: 40 meq via ORAL
  Filled 2015-10-05: qty 2

## 2015-10-05 MED ORDER — TORSEMIDE 20 MG PO TABS: 20.0000 mg | ORAL_TABLET | Freq: Every day | ORAL | Status: DC

## 2015-10-05 MED ORDER — TORSEMIDE 20 MG PO TABS
20.0000 mg | ORAL_TABLET | Freq: Two times a day (BID) | ORAL | Status: DC
Start: 2015-10-05 — End: 2015-10-05

## 2015-10-05 MED ORDER — POTASSIUM CHLORIDE CRYS ER 20 MEQ PO TBCR: 60.0000 meq | EXTENDED_RELEASE_TABLET | Freq: Two times a day (BID) | ORAL | Status: DC

## 2015-10-05 MED ORDER — TORSEMIDE 20 MG PO TABS
20.0000 mg | ORAL_TABLET | Freq: Two times a day (BID) | ORAL | Status: DC
Start: 2015-10-05 — End: 2015-10-12

## 2015-10-05 NOTE — Evaluation (Signed)
Physical Therapy Evaluation Patient Details Name: [name] MRN: [id number] DOB: 1940/11/25 Today's Date: 10/05/2015   History of Present Illness  [name] is a 75 y.o. female with a history of Diastolic CHF, HTN, Mital Valve Disease, Pulmonary HTN who presents to the ED with complaints of increased weakness and fatigue since discharge from the hospital 3 days ago for CHF. Her medications had been changed and adjusted. She was evaluated in the ED and was found to have Hypokalemia of 2.4, and an elevated BUN/Cr of 49/1.33.  Clinical Impression  Pt admitted with above diagnosis. Pt currently with functional limitations due to the deficits listed below (see PT Problem List).  Pt is at baseline functioning and is okay to return home with intermittent assistance. Pt is schedule to d/c home today so Acute PT signing off at this time and deferring needs to [place]. Pt would benefit from [facility] PT, OT, and aide to ensure smooth transition back home and to continue to decrease fall risk.      Follow Up Recommendations Home health PT;Other (comment) (HH OT, aide (female for dressing/bathing))    Equipment Recommendations  None recommended by PT (Pt already owns)    Recommendations for Other Services       Precautions / Restrictions Precautions Precautions: Fall Restrictions Weight Bearing Restrictions: No      Mobility  Bed Mobility Overal bed mobility: Modified Independent Bed Mobility: Supine to Sit;Sit to Supine     Supine to sit: Modified independent (Device/Increase time) Sit to supine: Modified independent (Device/Increase time)   General bed mobility comments: Independent with HOB elevated slightly. Pt states that she sleeps propped on pillows at home.   Transfers Overall transfer level: Modified independent Equipment used: Rolling walker (2 wheeled) Transfers: Sit to/from Stand Sit to Stand: Modified independent (Device/Increase time)         General  transfer comment: Independent with rolling walker, just needs increased time.   Ambulation/Gait Ambulation/Gait assistance: Supervision Ambulation Distance (Feet): 400 Feet (200+200 with standing rest break) Assistive device: Rolling walker (2 wheeled) Gait Pattern/deviations: Step-through pattern;Decreased stride length;Trendelenburg;Trunk flexed;Narrow base of support Gait velocity: very slow.  Gait velocity interpretation: Below normal speed for age/gender General Gait Details: Pt stable with RW but is reliant on UE support. Pt c/o fatigue halfway through the walk but is feeling much better than she did a couple of days ago.   Stairs            Wheelchair Mobility    Modified Rankin (Stroke Patients Only)       Balance Overall balance assessment: Needs assistance Sitting-balance support: No upper extremity supported Sitting balance-Leahy Scale: Good     Standing balance support: Bilateral upper extremity supported Standing balance-Leahy Scale: Fair Standing balance comment: Reliant on RW                             Pertinent Vitals/Pain Pain Assessment: No/denies pain    Home Living Family/patient expects to be discharged to:: Private residence Living Arrangements: Spouse/significant other;Children Available Help at Discharge: Family;Available PRN/intermittently Type of Home: House Home Access: Ramped entrance     Home Layout: One level Home Equipment: [place] - 4 wheels;Shower seat Additional Comments: Pt lives at home with her spouse who is w/c bound due to BLE amputations. Pt's son lives with them but works during the day. Pt states that her husband is independent with his power w/c and is able  to get her things so she does not have to get up often.     Prior Function Level of Independence: Independent with assistive device(s)         Comments: On home O2 2L, uses Rollator RW; lately very inactve, according to son, here recently would go days  at a time without getting OOB except for the bathroom     Hand Dominance        Extremity/Trunk Assessment   Upper Extremity Assessment: Generalized weakness           Lower Extremity Assessment: Generalized weakness      Cervical / Trunk Assessment: Kyphotic  Communication   Communication: No difficulties  Cognition Arousal/Alertness: Awake/alert Behavior During Therapy: WFL for tasks assessed/performed Overall Cognitive Status: Within Functional Limits for tasks assessed                      General Comments General comments (skin integrity, edema, etc.): Pt's SaO2 > 90% on 2L [place] with ambulation.     Exercises        Assessment/Plan    PT Assessment All further PT needs can be met in the next venue of care  PT Diagnosis Difficulty walking;Generalized weakness   PT Problem List Decreased strength;Decreased activity tolerance;Decreased balance;Cardiopulmonary status limiting activity  PT Treatment Interventions     PT Goals (Current goals can be found in the Care Plan section) Acute Rehab PT Goals Patient Stated Goal: wants to feel better PT Goal Formulation: With patient    Frequency     Barriers to discharge        Co-evaluation               End of Session Equipment Utilized During Treatment: Gait belt;Oxygen Activity Tolerance: Patient tolerated treatment well Patient left: in bed;with call bell/phone within reach;with family/visitor present Nurse Communication: Mobility status         Time: 9597-4718 PT Time Calculation (min) (ACUTE ONLY): 23 min   Charges:   PT Evaluation $PT Eval Moderate Complexity: 1 Procedure PT Treatments $Gait Training: 8-22 mins   PT G Codes:       Colon Branch, SPT Colon Branch 10/05/2015, 4:03 PM

## 2015-10-05 NOTE — Consult Note (Signed)
Advanced Heart Failure Team Consult Note  Referring Physician: Dr Jomarie Longs PCP: Dr Valentina Lucks  Primary Cardiologist: Dr Mayford Knife  Pulmonary: Dr Maple Hudson HF Cardiology: Dr. Shirlee Latch  Reason for Consultation: Hypokalemia/CHF  HPI:    [name] is a 75 yo with CAD s/p CABG, rheumatic mitral valve disease, RV failure, and severe COPD. 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 Further work up for endocarditis was unremarkable (BCx negative) and ID recommended just following for now. Diuresed 8 lbs. Discharge weight was 178lbs.  We had difficulty supping her K. It got as low as 2.5 and required doses of 200 meq daily on IV diuresis.   Presented to [facility] 10/03/15 with complaints of weakness and fatigue. Pertinent admission labs included K 2.4 and Creatinine 1.33. Admitted for electrolyte replacement. She had been transitioned from lasix to torsemide and her K supp increased on discharge. Creatinine improved with holding diuretics and gentle IVF hydration.   She states she was taking all medication as directed.  Potassium and Creatinine much improved this am at 3.8 and 0.89, respectively. She states on Saturday she was feeling tired and didn't have any energy.  She was also having "shakes".  Daughter, who is a health care provider, suggested she tried eating some bananas for more potassium.  She continued to feel ill and came into ER.  Denied fluid or salt indiscretion. No CP. Denies SOB, orthopnea, or PND.   Review of Systems: [y] = yes, [ ]  = no   General: Weight gain [ ] ; Weight loss [y]; Anorexia [ ] ; Fatigue [y]; Fever [ ] ; Chills [ ] ; Weakness [y]  Cardiac: Chest pain/pressure [ ] ; Resting SOB [ ] ; Exertional SOB [y]; Orthopnea [ ] ; Pedal Edema [ ] ; Palpitations [ ] ; Syncope [ ] ; Presyncope [ ] ; Paroxysmal nocturnal dyspnea[ ]   Pulmonary: Cough [ ] ; Wheezing[ ] ; Hemoptysis[ ] ; Sputum [ ] ; Snoring [ ]   GI: Vomiting[ ] ; Dysphagia[  ]; Melena[ ] ; Hematochezia [ ] ; Heartburn[ ] ; Abdominal pain [ ] ; Constipation [ ] ; Diarrhea [ ] ; BRBPR [ ]   GU: Hematuria[ ] ; Dysuria [ ] ; Nocturia[ ]   Vascular: Pain in legs with walking [ ] ; Pain in feet with lying flat [ ] ; Non-healing sores [ ] ; Stroke [ ] ; TIA [ ] ; Slurred speech [ ] ;  Neuro: Headaches[ ] ; Vertigo[ ] ; Seizures[ ] ; Paresthesias[ ] ;Blurred vision [ ] ; Diplopia [ ] ; Vision changes [ ]   Ortho/Skin: Arthritis [y]; Joint pain [y]; Muscle pain [ ] ; Joint swelling [ ] ; Back Pain [ ] ; Rash [ ]   Psych: Depression[ ] ; Anxiety[ ]   Heme: Bleeding problems [ ] ; Clotting disorders [ ] ; Anemia [ ]   Endocrine: Diabetes [ ] ; Thyroid dysfunction[ ]   Home Medications Prior to Admission medications   Medication Sig Start Date End Date Taking? Authorizing Provider  acetaminophen (TYLENOL) 500 MG tablet Take 1,000 mg by mouth daily as needed for headache.   Yes Historical Provider, MD  azelastine (ASTELIN) 0.1 % nasal spray Place 1 spray into both nostrils 2 (two) times daily. Use in each nostril as directed   Yes Historical Provider, MD  calcium-vitamin D (OSCAL WITH D) 500-200 MG-UNIT per tablet Take 2 tablets by mouth daily with breakfast.   Yes Historical Provider, MD  gabapentin (NEURONTIN) 300 MG capsule Take 300 mg by mouth 2 (two) times daily. 07/28/15  Yes Historical Provider, MD  loratadine (CLARITIN) 10 MG tablet Take 10 mg by mouth daily.   Yes  Historical Provider, MD  metoprolol (LOPRESSOR) 50 MG tablet Take 1 tablet by mouth two  times daily 08/10/15  Yes Quintella Reichert, MD  nitroGLYCERIN (NITROSTAT) 0.4 MG SL tablet Place 1 tablet (0.4 mg total) under the tongue every 5 (five) minutes as needed for chest pain. 12/23/13  Yes Quintella Reichert, MD  omeprazole (PRILOSEC OTC) 20 MG tablet Take 20 mg by mouth at bedtime as needed (GERD).    Yes Historical Provider, MD  OXYGEN Inhale 2 L into the lungs continuous.   Yes Historical Provider, MD  potassium chloride SA (K-DUR,KLOR-CON) 20 MEQ  tablet Take 1-2 tablets (20-40 mEq total) by mouth as directed. Take 2 tablets (40 meq) every morning, and 1 tablet (20 meq) every evening. 09/30/15  Yes Graciella Freer, PA-C  rivaroxaban (XARELTO) 20 MG TABS tablet Take 1 tablet (20 mg total) by mouth at bedtime. 02/16/15  Yes Quintella Reichert, MD  sertraline (ZOLOFT) 100 MG tablet Take 100 mg by mouth at bedtime.    Yes Historical Provider, MD  simvastatin (ZOCOR) 40 MG tablet Take 1 tablet by mouth  every evening 08/10/15  Yes Quintella Reichert, MD  spironolactone (ALDACTONE) 25 MG tablet Take 1 tablet (25 mg total) by mouth daily. 09/30/15  Yes Graciella Freer, PA-C  torsemide (DEMADEX) 20 MG tablet Take 2 tablets (40 mg total) by mouth 2 (two) times daily. 09/30/15  Yes Graciella Freer, PA-C  alendronate (FOSAMAX) 70 MG tablet Take 70 mg by mouth once a week. 09/18/15   Historical Provider, MD    Past Medical History: Past Medical History  Diagnosis Date  . COPD (chronic obstructive pulmonary disease) (HCC)   . Pulmonary HTN (HCC)     PASP 50-21mmHg by [id number]  . History of rheumatic heart disease     X 3-LAST TIME WHEN PATIENT WAS 75 YRS OLD  . Sleep apnea     intolerant to CPAP  . Hypertension   . Bursitis, shoulder     bilateral  . GERD (gastroesophageal reflux disease)   . H/O hiatal hernia   . Anxiety   . Depression   . Varicose veins   . PUD (peptic ulcer disease)   . 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 DJD (degenerative joint disease)   . OA (osteoarthritis)   . Hyperlipidemia   . Urinary incontinence   . Pulmonary HTN (HCC)   . Thyroid nodule   . CHF (congestive heart failure) (HCC)     chronic diasotlic CHF  . Complication of anesthesia     "have trouble getting me awake sometimes; been ok latelhy" (09/24/2015)  . On home oxygen therapy     "2L; 24/7" (09/24/2015)    Past Surgical History: Past Surgical History  Procedure Laterality Date  . Bilateral salpingoophorectomy    . Vaginal delivery   x4  . Thyroidectomy, partial  2010    Dr Michaell Cowing  . Tubal ligation    . Total abdominal hysterectomy  1990    1990  . Coronary artery bypass graft  06/21/2000    x4  . Cardiac catheterization  02/11/2008, 01/30/2004, 06/14/2000  . Incontinence surgery      "tacked"  . Cardiac catheterization N/A 09/24/2015    Procedure: Right Heart Cath;  Surgeon: Laurey Morale, MD;  [facility] INVASIVE CV LAB;  Service: Cardiovascular;  Laterality: N/A;  . Tee without cardioversion N/A 09/24/2015    Procedure: TRANSESOPHAGEAL ECHOCARDIOGRAM (TEE);  Surgeon: Freida Busman  Alford Highland, MD;  Location: [facility] ENDOSCOPY;  Service: Cardiovascular;  Laterality: N/A;    Family History: Family History  Problem Relation Age of Onset  . Anemia Mother   . Emphysema Father   . Esophageal cancer Daughter     Social History: Social History   Social History  . Marital Status: Married    Spouse Name: N/A  . Number of Children: N/A  . Years of Education: N/A   Social History Main Topics  . Smoking status: Former Smoker -- 1.00 packs/day for 47 years    Types: Cigarettes    Quit date: 07/26/2007  . Smokeless tobacco: Never Used  . Alcohol Use: No  . Drug Use: No  . Sexual Activity: No   Other Topics Concern  . None   Social History Narrative   Husband bilateral left amputee for atheorsclerotic disease    Allergies:  Allergies  Allergen Reactions  . Prednisone Rash    jittery  . Xopenex [Levalbuterol]     Made mouth and throat sore  . Cefpodoxime Other (See Comments)    Yeast infections   . Clindamycin Other (See Comments)    Unknown   . Levalbuterol Hcl Other (See Comments)    Unknown   . Lipitor [Atorvastatin]     Other reaction(s): Other (See Comments) Made joint start hurting  Muscle aches  . Other Swelling    Venholin HFA  . Penicillins Other (See Comments)    Has patient had a PCN reaction causing immediate rash, facial/tongue/throat swelling, SOB or lightheadedness with hypotension: unknown,  childhood reaction Has patient had a PCN reaction causing severe rash involving mucus membranes or skin necrosis: No Has patient had a PCN reaction that required hospitalization No Has patient had a PCN reaction occurring within the last 10 years: No If all of the above answers are "NO", then may proceed with Cephalosporin use.   . Tape Other (See Comments)    Bleeding  . Ventolin [Kdc:Albuterol]     "jittery"  . Codeine Other (See Comments)    anxious jittery  . Hydrocodone-Acetaminophen Anxiety  . Latex Itching and Rash    Objective:    Vital Signs:   Temp:  [97.5 F (36.4 C)-98.5 F (36.9 C)] 97.5 F (36.4 C) (03/13 0526) Pulse Rate:  [69-83] 70 (03/13 1021) Resp:  [16-18] 16 (03/13 0526) BP: (109-165)/(40-97) 165/55 mmHg (03/13 1021) SpO2:  [76 %-100 %] 100 % (03/13 1021) Weight:  [181 lb 14.1 oz (82.5 kg)] 181 lb 14.1 oz (82.5 kg) (03/13 0526) Last BM Date: 10/04/15  Weight change: Filed Weights   10/03/15 2105 10/05/15 0526  Weight: 179 lb 7.3 oz (81.4 kg) 181 lb 14.1 oz (82.5 kg)    Intake/Output:   Intake/Output Summary (Last 24 hours) at 10/05/15 1217 Last data filed at 10/05/15 1004  Gross per 24 hour  Intake      0 ml  Output   1127 ml  Net  -1127 ml     Physical Exam: General: NAD Neck: JVP 6-7 cm, no thyromegaly or thyroid nodule.  Lungs: Slight basilar crackles bilaterally. CV: Nondisplaced PMI. Heart regular with ectopy S1/S2, no S3/S4, 2/6 HSM LLSB. No edema.  Abdomen: Soft, NT, ND, no HSM. No bruits or masses. +BS  Neurologic: Alert and oriented x 3.  Psych: Normal affect. Extremities: No clubbing or cyanosis.   Telemetry: NSR with PACs  Labs: Basic Metabolic Panel:  Recent Labs Lab 09/28/15 1300  09/29/15 1442 09/30/15 0400 10/03/15 1614 10/04/15  1610 10/05/15 0546  NA 141  < > 139 142 135 143 142  K 3.4*  < > 3.0* 4.0 2.4* 3.0* 3.8  CL 83*  < > 79* 80* 71* 80* 87*  CO2 45*  < > 44* 46* 47* >50* 43*  GLUCOSE 142*  < >  111* 124* 159* 101* 97  BUN 23*  < > 32* 36* 49* 41* 24*  CREATININE 1.09*  < > 1.19* 1.11* 1.33* 1.20* 0.89  CALCIUM 9.9  < > 10.3 10.2 9.8 9.5 9.8  MG 1.8  --   --   --   --  2.3  --   < > = values in this interval not displayed.  Liver Function Tests: No results for input(s): AST, ALT, ALKPHOS, BILITOT, PROT, ALBUMIN in the last 168 hours. No results for input(s): LIPASE, AMYLASE in the last 168 hours. No results for input(s): AMMONIA in the last 168 hours.  CBC:  Recent Labs Lab 09/29/15 0428 09/30/15 0400 10/03/15 1614 10/04/15 0707 10/05/15 0546  WBC 9.7 10.2 8.2 7.9 7.6  NEUTROABS 6.0 6.1  --   --   --   HGB 10.9* 11.6* 10.5* 9.6* 9.8*  HCT 38.0 39.3 36.7 34.2* 35.0*  MCV 86.2 86.4 86.2 88.4 89.1  PLT 248 267 263 236 245    Cardiac Enzymes: No results for input(s): CKTOTAL, CKMB, CKMBINDEX, TROPONINI in the last 168 hours.  BNP: BNP (last 3 results)  Recent Labs  09/14/15 1519 09/24/15 1605 09/29/15 0428  BNP 208.5* 436.5* 165.4*    ProBNP (last 3 results)  Recent Labs  01/09/15 0907  PROBNP 245.0*     CBG: No results for input(s): GLUCAP in the last 168 hours.  Coagulation Studies: No results for input(s): LABPROT, INR in the last 72 hours.  Other results: EKG: 10/03/15 68 bpm, EKG abnormal with gross electrolyte abnormalities.   Imaging:  No results found.   Medications:     Current Medications: . calcium-vitamin D  2 tablet Oral Q breakfast  . gabapentin  300 mg Oral BID  . loratadine  10 mg Oral Daily  . metoprolol  50 mg Oral BID  . potassium chloride  40 mEq Oral TID  . rivaroxaban  20 mg Oral Q supper  . sertraline  100 mg Oral QHS  . simvastatin  40 mg Oral q1800  . sodium chloride flush  3 mL Intravenous Q12H  . spironolactone  25 mg Oral Daily  . torsemide  20 mg Oral BID     Infusions:      Assessment/Plan   1. Hypokalemia - - Improved with aggressive supp and holding diuretics over past several days. - 2.4  -> 3.0 -> 3.8 2. Chronic diastolic HF - Volume status stable on exam. She is unlikely to diurese fully at torsemide 20 mg BID.  Should use at least 40 mg am/ 20 mg pm. Would send home on Torsemide 40 mg q am and 20 mg q pm with Potassium 60 meq BID and close BMET.  3. A fib, Paroxysmal 4. Pulm HTN 5. COPD  - Previously severe by PFTs.  Contributes to symptoms 6. Deconditioning, Severe - Should work with PT further while in house.  7. AKI on CKD 2 - Creatinine and BUN elevated on admission, improved with holding of diuretics and gentle IV hydration.  8. Aortic Valve vegetation - Should continue to follow up with Dr Orvan Falconer as outpatient as previously arranged.    Volume status stable and  electrolytes corrected.  Would send home on torsemide  q am and 20 mg q pm with Potassium 60 meq BID.  Will arranged for [facility] to draw BMET on 10/08/15. She has HF clinic follow up on 10/12/15.  Length of Stay: 2  Graciella Freer PA-C 10/05/2015, 12:17 PM  Advanced Heart Failure Team Pager [phone] (M-F; 7a - 4p)  Please contact [facility] for night-coverage after hours (4p -7a ) and weekends on [email or web address]  Patient seen and examined with Otilio Saber, PA-C. We discussed all aspects of the encounter. I agree with the assessment and plan as stated above.   She was admitted with AKI and hypokalemia due to overdiuresis. Recent course and diuretic changes reviewed in detail.  She is now improved. Can d/c home today on torsemide 40/20 and KCL 60 bid. Recheck BMET on Thursday. No current infectious symptoms to suggest active endocarditis.  F/u HF Clinic next week.   Bensimhon, Daniel,MD 9:45 PM

## 2015-10-05 NOTE — Progress Notes (Addendum)
[facility] PROGRESS NOTE  [name] WUG:[id number] DOB: 04/17/1941 DOA: 10/03/2015 PCP: Astrid Divine, MD Interim summary: [name] is a 75 y.o. female with a history of Diastolic CHF, HTN, Mital Valve Disease, Pulmonary HTN who presents to the ED with complaints of increased weakness and fatigue since discharge from the hospital 3 days ago for CHF. Her medications had been changed and adjusted. She was evaluated in the ED and was found to have Hypokalemia of 2.4, and an elevated BUN/Cr of 49/1.33.   Assessment/Plan: 1. Hypokalemia: -repleted and improved -she was discharged on Torsemide 40mg  BID last time and KCL in am and 20 in pm -will resume torsemide with KCL today  2. AKi on CKD 2 -creatinine 1.3 on admission with BUN of 49 -due to diuresis -was hydrated overnight on 3/11, off IVF since yetserday -resume Torsemide today  3. Chronic diastolic CHF/RV failure -EF 60% -was diuresed last admission, 2.4L and 8lbs -diuretics on hold since readmission, resume today atTorsemide 20mg  BID -will ask Cards for input, diuretics dosing and needs close FU -continue Aldactone  4. Chronic Anemia  -with some worsening, check anemia panel  5. P afib -stable, rate controlled, on metoprolol and xarelto  6. 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 COPD -stable  8.  Debility/weakness -Pt eval  DVT proph: on xarelto  Code Status: full code.  Family Communication: none at bedside.  Disposition Plan: home when potassium stabilizes   Consultants:  none  Procedures:  none  Antibiotics:  none  HPI/Subjective: Denies any new complaints.   Objective: Filed Vitals:   10/05/15 0526 10/05/15 1021  BP: 110/63 165/55  Pulse: 69 70  Temp: 97.5 F (36.4 C)   Resp: 16     Intake/Output Summary (Last 24 hours) at 10/05/15 1157 Last data filed at 10/05/15 1004  Gross per 24 hour  Intake      0 ml  Output   1127 ml  Net  -1127 ml    Filed Weights   10/03/15 2105 10/05/15 0526  Weight: 81.4 kg (179 lb 7.3 oz) 82.5 kg (181 lb 14.1 oz)    Exam:   General:  AAOx3.   Cardiovascular: s1s2/RRR  Respiratory: scant basilar crackles  Abdomen: soft non tender non distended bowel sounds heard.   Musculoskeletal: trace pedal edema.   Data Reviewed: Basic Metabolic Panel:  Recent Labs Lab 09/28/15 1300  09/29/15 1442 09/30/15 0400 10/03/15 1614 10/04/15 0707 10/05/15 0546  NA 141  < > 139 142 135 143 142  K 3.4*  < > 3.0* 4.0 2.4* 3.0* 3.8  CL 83*  < > 79* 80* 71* 80* 87*  CO2 45*  < > 44* 46* 47* >50* 43*  GLUCOSE 142*  < > 111* 124* 159* 101* 97  BUN 23*  < > 32* 36* 49* 41* 24*  CREATININE 1.09*  < > 1.19* 1.11* 1.33* 1.20* 0.89  CALCIUM 9.9  < > 10.3 10.2 9.8 9.5 9.8  MG 1.8  --   --   --   --  2.3  --   < > = values in this interval not displayed. Liver Function Tests: No results for input(s): AST, ALT, ALKPHOS, BILITOT, PROT, ALBUMIN in the last 168 hours. No results for input(s): LIPASE, AMYLASE in the last 168 hours. No results for input(s): AMMONIA in the last 168 hours. CBC:  Recent Labs Lab 09/29/15 0428 09/30/15 0400 10/03/15 1614 10/04/15 0707 10/05/15 0546  WBC  9.7 10.2 8.2 7.9 7.6  NEUTROABS 6.0 6.1  --   --   --   HGB 10.9* 11.6* 10.5* 9.6* 9.8*  HCT 38.0 39.3 36.7 34.2* 35.0*  MCV 86.2 86.4 86.2 88.4 89.1  PLT 248 267 263 236 245   Cardiac Enzymes: No results for input(s): CKTOTAL, CKMB, CKMBINDEX, TROPONINI in the last 168 hours. BNP (last 3 results)  Recent Labs  09/14/15 1519 09/24/15 1605 09/29/15 0428  BNP 208.5* 436.5* 165.4*    ProBNP (last 3 results)  Recent Labs  01/09/15 0907  PROBNP 245.0*    CBG: No results for input(s): GLUCAP in the last 168 hours.  Recent Results (from the past 240 hour(s))  Culture, blood (routine x 2)     Status: None   Collection Time: 09/26/15  5:30 AM  Result Value Ref Range Status   Specimen Description BLOOD LEFT  ANTECUBITAL  Final   Special Requests BOTTLES DRAWN AEROBIC AND ANAEROBIC 8CC  Final   Culture NO GROWTH 5 DAYS  Final   Report Status 10/01/2015 FINAL  Final  Culture, blood (routine x 2)     Status: None   Collection Time: 09/26/15  5:40 AM  Result Value Ref Range Status   Specimen Description BLOOD LEFT HAND  Final   Special Requests BOTTLES DRAWN AEROBIC AND ANAEROBIC 8CC  Final   Culture NO GROWTH 5 DAYS  Final   Report Status 10/01/2015 FINAL  Final  Culture, blood (routine x 2)     Status: None   Collection Time: 09/28/15  1:00 PM  Result Value Ref Range Status   Specimen Description BLOOD LEFT ARM  Final   Special Requests BOTTLES DRAWN AEROBIC AND ANAEROBIC 10 CC  Final   Culture NO GROWTH 5 DAYS  Final   Report Status 10/03/2015 FINAL  Final  Culture, blood (routine x 2)     Status: None   Collection Time: 09/28/15  1:05 PM  Result Value Ref Range Status   Specimen Description BLOOD RIGHT ARM  Final   Special Requests BOTTLES DRAWN AEROBIC ONLY 10 CC  Final   Culture NO GROWTH 5 DAYS  Final   Report Status 10/03/2015 FINAL  Final     Studies: No results found.  Scheduled Meds: . calcium-vitamin D  2 tablet Oral Q breakfast  . gabapentin  300 mg Oral BID  . loratadine  10 mg Oral Daily  . metoprolol  50 mg Oral BID  . potassium chloride  40 mEq Oral BID  . rivaroxaban  20 mg Oral Q supper  . sertraline  100 mg Oral QHS  . simvastatin  40 mg Oral q1800  . sodium chloride flush  3 mL Intravenous Q12H  . spironolactone  25 mg Oral Daily   Continuous Infusions:   Principal Problem:   Hypokalemia Active Problems:   Coronary atherosclerosis   COPD (chronic obstructive pulmonary disease) with emphysema (HCC)   Chronic diastolic heart failure (HCC)   Chronic anticoagulation   HTN (hypertension)   AKI (acute kidney injury) (HCC)   Weakness    Time spent: 25 minutes.     Zannie Cove  [facility] Pager [phone] If 7PM-7AM, please contact  night-coverage at [email or web address], password [facility] 10/05/2015, 11:57 AM  LOS: 2 days

## 2015-10-05 NOTE — Progress Notes (Signed)
[name] to be D/C'd Home per MD order.  Discussed with the patient and all questions fully answered.  VSS, Skin clean, dry and intact without evidence of skin break down, no evidence of skin tears noted. IV catheter discontinued intact. Site without signs and symptoms of complications. Dressing and pressure applied.  An After Visit Summary was printed and given to the patient. Patient received prescription.  D/c education completed with patient/family including follow up instructions, medication list, d/c activities limitations if indicated, with other d/c instructions as indicated by MD - patient able to verbalize understanding, all questions fully answered.   Patient instructed to return to ED, call 911, or call MD for any changes in condition.   Patient to be escorted via WC, and D/C home via private auto.  L'ESPERANCE, Rever Pichette C 10/05/2015 4:24 PM

## 2015-10-05 NOTE — Progress Notes (Signed)
PT Note Pt did well with therapy. Pt okay to return home with intermittent help from her son. PT recommending HH PT, OT, and aide (RN if needed) to ensure smooth transition home and assistance with ADL's for short term. Pt requesting female for bathing/dressing assistance. Full PT evaluation note to follow.   Thanks Everlean Cherry, SPT

## 2015-10-12 ENCOUNTER — Ambulatory Visit (HOSPITAL_COMMUNITY)
Admission: RE | Admit: 2015-10-12 | Discharge: 2015-10-12 | Disposition: A | Discharge status: Home / Self Care | Payer: Medicare Other | Source: Ambulatory Visit | Attending: Cardiology | Admitting: Cardiology

## 2015-10-12 ENCOUNTER — Encounter (HOSPITAL_COMMUNITY): Payer: Self-pay

## 2015-10-12 ENCOUNTER — Other Ambulatory Visit: Payer: Self-pay | Admitting: Cardiology

## 2015-10-12 VITALS — BP 122/66 | HR 72 | Wt 185.2 lb

## 2015-10-12 DIAGNOSIS — K219 Gastro-esophageal reflux disease without esophagitis: Secondary | ICD-10-CM | POA: Diagnosis not present

## 2015-10-12 DIAGNOSIS — I4891 Unspecified atrial fibrillation: Secondary | ICD-10-CM | POA: Insufficient documentation

## 2015-10-12 DIAGNOSIS — J439 Emphysema, unspecified: Secondary | ICD-10-CM

## 2015-10-12 DIAGNOSIS — Z825 Family history of asthma and other chronic lower respiratory diseases: Secondary | ICD-10-CM | POA: Diagnosis not present

## 2015-10-12 DIAGNOSIS — Z951 Presence of aortocoronary bypass graft: Secondary | ICD-10-CM | POA: Diagnosis not present

## 2015-10-12 DIAGNOSIS — Z87891 Personal history of nicotine dependence: Secondary | ICD-10-CM | POA: Insufficient documentation

## 2015-10-12 DIAGNOSIS — I110 Hypertensive heart disease with heart failure: Secondary | ICD-10-CM | POA: Diagnosis not present

## 2015-10-12 DIAGNOSIS — I272 Other secondary pulmonary hypertension: Secondary | ICD-10-CM

## 2015-10-12 DIAGNOSIS — Z9981 Dependence on supplemental oxygen: Secondary | ICD-10-CM | POA: Insufficient documentation

## 2015-10-12 DIAGNOSIS — I5032 Chronic diastolic (congestive) heart failure: Secondary | ICD-10-CM

## 2015-10-12 DIAGNOSIS — J449 Chronic obstructive pulmonary disease, unspecified: Secondary | ICD-10-CM | POA: Insufficient documentation

## 2015-10-12 DIAGNOSIS — I2510 Atherosclerotic heart disease of native coronary artery without angina pectoris: Secondary | ICD-10-CM | POA: Insufficient documentation

## 2015-10-12 DIAGNOSIS — Z79899 Other long term (current) drug therapy: Secondary | ICD-10-CM | POA: Diagnosis not present

## 2015-10-12 DIAGNOSIS — I519 Heart disease, unspecified: Secondary | ICD-10-CM

## 2015-10-12 DIAGNOSIS — G4733 Obstructive sleep apnea (adult) (pediatric): Secondary | ICD-10-CM | POA: Insufficient documentation

## 2015-10-12 DIAGNOSIS — E785 Hyperlipidemia, unspecified: Secondary | ICD-10-CM | POA: Insufficient documentation

## 2015-10-12 DIAGNOSIS — M1990 Unspecified osteoarthritis, unspecified site: Secondary | ICD-10-CM | POA: Insufficient documentation

## 2015-10-12 DIAGNOSIS — I050 Rheumatic mitral stenosis: Secondary | ICD-10-CM | POA: Diagnosis not present

## 2015-10-12 DIAGNOSIS — Z7901 Long term (current) use of anticoagulants: Secondary | ICD-10-CM | POA: Insufficient documentation

## 2015-10-12 MED ORDER — POTASSIUM CHLORIDE CRYS ER 20 MEQ PO TBCR: 40.0000 meq | EXTENDED_RELEASE_TABLET | Freq: Two times a day (BID) | ORAL | Status: DC

## 2015-10-12 MED ORDER — TORSEMIDE 20 MG PO TABS: 40.0000 mg | ORAL_TABLET | Freq: Two times a day (BID) | ORAL | Status: DC

## 2015-10-12 NOTE — Progress Notes (Signed)
Patient ID: [name], female   DOB: [birth year], 75 y.o.   MRN: [id number]    Advanced Heart Failure Clinic Note   PCP: Dr Valentina Lucks  Primary Cardiologist: Dr Mayford Knife  Pulmonary: Dr Maple Hudson HF Cardiology: Dr. Shirlee Latch  HPI: [name] is a 75 y.o. female with a history of chronic diastolic CHF, moderate pulmonary HTN, HTN, CAD s/p CABG, COPD on home oxygen, and dyslipidemia referred to the HF clinic by Dr Mayford Knife.  She has developed worsening dyspnea with exertion over the last few months.   Admitted 3/2 - 09/30/15 with A/C CHF. TEE was done to assess mitral stenosis, appeared mild to moderate, rheumatic valve.  TEE also showed aortic valve vegetation and concern arose for endocarditis with recent "tooth infection" per pt.  ID saw. 1 of 2 admission cultures with Gram + cocci in clusters thought to be contaminant.  2 further sets of BCx negative. RHC showed elevated right and left heart filling pressures.  Aortic valve was not crossed for full mitral stenosis assessment due to aortic valve vegetation. Diuresed 8 lbs. Discharge weight 175 lbs.   She returns today for post hospital follow up.  Weight is up 7 lbs from her HF admission. She still feels like she's getting stronger every day although she does feel like she is putting fluid back on.  Gets SOB without 02, but with it can do ADLs without DOE. Has mild SOB with showering or changing clothes. Still using her walker a lot, but feels more confident without it than last time. No more orthopnea. No PND. She sleeps on 2 pillows chronically. Denies lightheadedness or dizziness. Sees Dr Maple Hudson and Dr Orvan Falconer this week  - ECHO 12/2014 EF 55-60%, peak PA pressure 68 mmHg, mild to moderate MS. - ECHO 2/17 EF 60-65%, mild LVH, mildly dilated RV with mildly decreased systolic function, PASP 29 mmHg (not sure we got a full envelope), moderate mitral stenosis with mean gradient 6 mmHg and MVA 1.37 cm^2, mild MR, mild AI, aortic sclerosis without significant  stenosis.   - TEE 09/24/15 EF 60%, small 0.8 cm mobile mass on the LV side of the aortic valve. Mild AI and AS. 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 A small PFO with a weakly positive bubble study, Moderate TR  RHC 09/24/15 (did not do LHC and cross valve to assess mitral stenosis due to aortic valve vegetation): Hemodynamics (mmHg) RA mean 16 RV 65/14 PA 71/28, mean 45 PCWP mean 32 Cardiac Output (Fick) 4.85  Cardiac Index (Fick) 2.51 PVR 2.68 WU  ECG: NSR, RBBB  Labs 01/09/2015 K 4.3 Creatinine 0.91 BNP  Labs 02/19/2015 K 4.1 Creatinine 1.03 Labs 10/05/15 K 3.8, Creatinine 0.89  Labs 10/08/15 K 4.6, Creatinine 0.7  Social History: The patient  reports that she quit smoking about 7 years ago. Her smoking use included Cigarettes. She has a 47 pack-year smoking history. She has never used smokeless tobacco. She reports that she does not drink alcohol or use illicit drugs.   Family History: The patient's family history includes Anemia in her mother; Emphysema in her father; Esophageal cancer in her daughter.   ROS: All systems negative except as listed in HPI, PMH and Problem List.  SH:  Social History   Social History  . Marital Status: Married    Spouse Name: N/A  . Number of Children: N/A  . Years of Education: N/A   Occupational History  . Not  on file.   Social History Main Topics  . Smoking status: Former Smoker -- 1.00 packs/day for 47 years    Types: Cigarettes    Quit date: 07/26/2007  . Smokeless tobacco: Never Used  . Alcohol Use: No  . Drug Use: No  . Sexual Activity: No   Other Topics Concern  . Not on file   Social History Narrative   Husband bilateral left amputee for atheorsclerotic disease    FH:  Family History  Problem Relation Age of Onset  . Anemia Mother   . Emphysema Father   . Esophageal cancer Daughter     Past Medical History  Diagnosis Date  . COPD (chronic obstructive pulmonary  disease) (HCC)   . Pulmonary HTN (HCC)     PASP 50-13mmHg by [id number]  . History of rheumatic heart disease     X 3-LAST TIME WHEN PATIENT WAS 75 YRS OLD  . Sleep apnea     intolerant to CPAP  . Hypertension   . Bursitis, shoulder     bilateral  . GERD (gastroesophageal reflux disease)   . H/O hiatal hernia   . Anxiety   . Depression   . Varicose veins   . PUD (peptic ulcer disease)   . 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 DJD (degenerative joint disease)   . OA (osteoarthritis)   . Hyperlipidemia   . Urinary incontinence   . Pulmonary HTN (HCC)   . Thyroid nodule   . CHF (congestive heart failure) (HCC)     chronic diasotlic CHF  . Complication of anesthesia     "have trouble getting me awake sometimes; been ok latelhy" (09/24/2015)  . On home oxygen therapy     "2L; 24/7" (09/24/2015)    Current Outpatient Prescriptions  Medication Sig Dispense Refill  . acetaminophen (TYLENOL) 500 MG tablet Take 1,000 mg by mouth daily as needed for headache.    Marland Kitchen azelastine (ASTELIN) 0.1 % nasal spray Place 1 spray into both nostrils 2 (two) times daily. Use in each nostril as directed    . calcium-vitamin D (OSCAL WITH D) 500-200 MG-UNIT per tablet Take 2 tablets by mouth daily with breakfast.    . gabapentin (NEURONTIN) 300 MG capsule Take 300 mg by mouth 2 (two) times daily.    Marland Kitchen loratadine (CLARITIN) 10 MG tablet Take 10 mg by mouth daily.    . metoprolol (LOPRESSOR) 50 MG tablet Take 1 tablet by mouth two  times daily 180 tablet 0  . nitroGLYCERIN (NITROSTAT) 0.4 MG SL tablet Place 1 tablet (0.4 mg total) under the tongue every 5 (five) minutes as needed for chest pain. 25 tablet 3  . omeprazole (PRILOSEC OTC) 20 MG tablet Take 20 mg by mouth at bedtime as needed (GERD).     . OXYGEN Inhale 2 L into the lungs continuous.    . potassium chloride SA (K-DUR,KLOR-CON) 20 MEQ tablet Take 20-40 mEq by mouth 2 (two) times daily. Take 2 tablets (40 meq) in the morning and 1 tablets (20  meq) and the evening.    . rivaroxaban (XARELTO) 20 MG TABS tablet Take 1 tablet (20 mg total) by mouth at bedtime. 90 tablet 2  . sertraline (ZOLOFT) 100 MG tablet Take 100 mg by mouth at bedtime.     . simvastatin (ZOCOR) 40 MG tablet Take 1 tablet by mouth  every evening 90 tablet 0  . spironolactone (ALDACTONE) 25 MG tablet Take 1 tablet (25 mg total)  by mouth daily. 30 tablet 6  . torsemide (DEMADEX) 20 MG tablet Take 1-2 tablets (20-40 mg total) by mouth 2 (two) times daily. Take 40mg  in am and 20mg  in pm    . alendronate (FOSAMAX) 70 MG tablet Take 70 mg by mouth once a week. Reported on 10/12/2015  0   No current facility-administered medications for this encounter.    Filed Vitals:   10/12/15 1417  BP: 122/66  Pulse: 72  Weight: 185 lb 4 oz (84.029 kg)  SpO2: 97%   Wt Readings from Last 3 Encounters:  10/12/15 185 lb 4 oz (84.029 kg)  10/05/15 181 lb 14.1 oz (82.5 kg)  09/30/15 178 lb 8 oz (80.967 kg)     PHYSICAL EXAM:  General:  NAD, wearing oxygen. HEENT: normal Neck: supple. JVP 8 with mild HJR. Carotids 2+ bilaterally; no bruits. No thyromegaly or nodule noted.  Cor: PMI normal. RRR. No rubs, gallops.  2/6 SEM RUSB.  Lungs: CTAB. On 2 liters [place] Abdomen: soft, NT, ND, no HSM. No bruits or masses. +BS  Extremities: no cyanosis, clubbing, rash.  Trace ankle edema.  Neuro: alert & orientedx3, cranial nerves grossly intact. Moves all 4 extremities w/o difficulty. Affect pleasant.  ASSESSMENT & PLAN: 1. Chronic diastolic CHF/valvular heart disease: TEE in 3/17 showed normal EF, mild to moderate mitral stenosis.  RHC showed elevated left and right heart filling pressures. NYHA IIIb symptoms.  She still appears to have mild volume overload.  - Increase torsemide to 40 mg BID. Increase potassium to 40 meq BID     - Continue 25 mg spironolactone daily.  - BMET x 1 week with [facility].  - Discussed limiting fluid intake to < 2 liters per day and low salt diet.  2. CAD S/P CABG:  No worrisome chest pain. Continue statin and metoprolol. On Xarelto so no aspirin.  3. Atrial fibrillation: Paroxysmal. CHADSVASC score is 4 (age > 75, female, HTN, CHF).  She remains in NSR.  - Continue Xarelto/metoprolol.  4. Pulmonary HTN: This is primarily pulmonary venous hypertension based on most recent RHC. 5. COPD: Followed by Dr Maple Hudson. On 2 liters [place].  - Sees Dr young 10/15/15. 6.  OSA: intolerant CPAP 7.  Mitral stenosis: Rheumatic mitral stenosis.  Mild to moderate mitral stenosis by TEE in 3/17.  Unable to assess by cath because she had aortic valve vegetation and the valve was not crossed. Treating medically for now.  Would be high risk for surgery with severe COPD.  However, possibly could be valvuloplasty candidate.    8. Aortic valve vegetation: No fever, blood cultures negative.  May have come from prior dental infection.  ID did not recommend antibiotics.  - Sees Dr Orvan Falconer 10/15/15  Med changes as above.  BMET next week via [facility]. Follow up 2 weeks PA/NP side.   Graciella Freer PA-C 10/12/2015   Patient seen with PA, agree with the above note.    She has chronic diastolic CHF and is mildly volume overloaded on exam.  Will increase torsemide and KCl as above.  BMET 1 week.   TEE showed mild to moderate mitral stenosis. I did not cross the aortic valve at cath because of suspected aortic endocarditis, so did not confirm severity of MS by cath.  She would be a poor surgical candidate most likely.  We could consider valvuloplasty, will need to continue to follow.   She had an aortic valve vegetation on the recent TEE.  Likely source was a  prior dental infection.  Blood cultures negative and afebrile.  No antibiotics, ID following.   Marca Ancona 10/13/2015

## 2015-10-12 NOTE — Patient Instructions (Signed)
Increase Torsemide to 40 mg (2 tabs) Twice daily   Increase Potassium to 40 meq (2 tabs) Twice daily   Labs next week by [facility]  Your physician recommends that you schedule a follow-up appointment in: 2 weeks

## 2015-10-14 NOTE — Discharge Summary (Addendum)
Physician Discharge Summary  [name] ZOX:[id number] DOB: [birth year] DOA: 10/03/2015  PCP: Astrid Divine, MD  Admit date: 10/03/2015 Discharge date: 10/05/2015  Time spent: 45 minutes  Recommendations for Outpatient Follow-up:  1. CHF team in 1 week 2/20, Bmet check at FU 2. PCP Dr.Griffin in 1week, please FU anemia panel and check hb at FU  Discharge Diagnoses:  Principal Problem:   Hypokalemia Active Problems:   Coronary atherosclerosis   COPD (chronic obstructive pulmonary disease) with emphysema (HCC)   Chronic diastolic heart failure (HCC)   Chronic anticoagulation   HTN (hypertension)   AKI (acute kidney injury) (HCC)   Weakness   Discharge Condition: stable  Diet recommendation: heart healthy  Filed Weights   10/03/15 2105 10/05/15 0526  Weight: 81.4 kg (179 lb 7.3 oz) 82.5 kg (181 lb 14.1 oz)    History of present illness:  [name] is a 75 yo with CAD s/p CABG, rheumatic mitral valve disease, RV failure, and severe COPD. She was recently admitted after TEE and RHC treated for CHF/ volume overload. Diuresed 8 lbs. Discharge weight was 178lbs. discharged home on 3/8 and presented to [facility] 10/03/15 with complaints of weakness and fatigue. Pertinent admission labs included K 2.4 and Creatinine 1.33  Hospital Course:  1. Hypokalemia: -repleted and improved -she was discharged on Torsemide 40mg  BID last time and KCL in am and 20 in pm -CHF team consulted and resumed torsemide, d/w Cardiology who recommended torsemide 40mg  in am and 20mg  in pm. -and KCL BID, Bmet in 1 week after discharge  2. AKi on CKD 2 -creatinine 1.3 on admission with BUN of 49 -due to diuresis -was hydrated overnight on 3/11, off IVF since yetserday -resumed Torsemide as above  3. Chronic diastolic CHF/RV failure -EF 60% -was diuresed last admission, 2.4L and 8lbs -diuretics were on hold initially, resumed torsemide at slightly lower dose than prior as noted  above -continue Aldactone  4. Chronic Anemia  -with some worsening, checked anemia panel -pending at time of discharge, needs FU  5. P afib -stable, rate controlled, on metoprolol and xarelto  6. 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 COPD -stable  8. Debility/weakness -Pt eval completed, Home health completed   Consultations:  CHF team  Discharge Exam: Filed Vitals:   10/05/15 1021 10/05/15 1431  BP: 165/55 165/90  Pulse: 70 70  Temp:  97.9 F (36.6 C)  Resp:  18    General: AAOx3 Cardiovascular: S1S2/RRR Respiratory: CTAB  Discharge Instructions   Discharge Instructions    Diet - low sodium heart healthy    Complete by:  As directed      Increase activity slowly    Complete by:  As directed           Discharge Medication List as of 10/05/2015  4:51 PM    CONTINUE these medications which have CHANGED   Details  potassium chloride SA (K-DUR,KLOR-CON) 20 MEQ tablet Take 3 tablets (60 mEq total) by mouth 2 (two) times daily., Starting 10/05/2015, Until Discontinued, Print    torsemide (DEMADEX) 20 MG tablet Take 1-2 tablets (20-40 mg total) by mouth 2 (two) times daily. Take 40mg  in am and 20mg  in pm, Starting 10/05/2015, Until Discontinued, No Print      CONTINUE these medications which have NOT CHANGED   Details  acetaminophen (TYLENOL) 500 MG tablet Take 1,000 mg by mouth daily as needed for headache., Until Discontinued, Historical Med  azelastine (ASTELIN) 0.1 % nasal spray Place 1 spray into both nostrils 2 (two) times daily. Use in each nostril as directed, Until Discontinued, Historical Med    calcium-vitamin D (OSCAL WITH D) 500-200 MG-UNIT per tablet Take 2 tablets by mouth daily with breakfast., Until Discontinued, Historical Med    gabapentin (NEURONTIN) 300 MG capsule Take 300 mg by mouth 2 (two) times daily., Starting 07/28/2015, Until Discontinued, Historical Med    loratadine (CLARITIN) 10 MG tablet Take 10 mg by mouth  daily., Until Discontinued, Historical Med    nitroGLYCERIN (NITROSTAT) 0.4 MG SL tablet Place 1 tablet (0.4 mg total) under the tongue every 5 (five) minutes as needed for chest pain., Starting 12/23/2013, Until Discontinued, Normal    omeprazole (PRILOSEC OTC) 20 MG tablet Take 20 mg by mouth at bedtime as needed (GERD). , Until Discontinued, Historical Med    OXYGEN Inhale 2 L into the lungs continuous., Until Discontinued, Historical Med    rivaroxaban (XARELTO) 20 MG TABS tablet Take 1 tablet (20 mg total) by mouth at bedtime., Starting 02/16/2015, Until Discontinued, Normal    sertraline (ZOLOFT) 100 MG tablet Take 100 mg by mouth at bedtime. , Until Discontinued, Historical Med    simvastatin (ZOCOR) 40 MG tablet Take 1 tablet by mouth  every evening, Normal    spironolactone (ALDACTONE) 25 MG tablet Take 1 tablet (25 mg total) by mouth daily., Starting 09/30/2015, Until Discontinued, Normal    metoprolol (LOPRESSOR) 50 MG tablet Take 1 tablet by mouth two  times daily, Normal    alendronate (FOSAMAX) 70 MG tablet Take 70 mg by mouth once a week., Starting 09/18/2015, Until Discontinued, Historical Med       Allergies  Allergen Reactions  . Prednisone Rash    jittery  . Xopenex [Levalbuterol]     Made mouth and throat sore  . Cefpodoxime Other (See Comments)    Yeast infections   . Clindamycin Other (See Comments)    Unknown   . Levalbuterol Hcl Other (See Comments)    Unknown   . Lipitor [Atorvastatin]     Other reaction(s): Other (See Comments) Made joint start hurting  Muscle aches  . Other Swelling    Venholin HFA  . Penicillins Other (See Comments)    Has patient had a PCN reaction causing immediate rash, facial/tongue/throat swelling, SOB or lightheadedness with hypotension: unknown, childhood reaction Has patient had a PCN reaction causing severe rash involving mucus membranes or skin necrosis: No Has patient had a PCN reaction that required hospitalization No Has  patient had a PCN reaction occurring within the last 10 years: No If all of the above answers are "NO", then may proceed with Cephalosporin use.   . Tape Other (See Comments)    Bleeding  . Ventolin [Kdc:Albuterol]     "jittery"  . Codeine Other (See Comments)    anxious jittery  . Hydrocodone-Acetaminophen Anxiety  . Latex Itching and Rash   Follow-up Information    Follow up with Astrid Divine, MD. Schedule an appointment as soon as possible for a visit in 1 week.   Specialty:  Family Medicine   Contact information:   [address] [place] [address] [phone]       Follow up with Marca Ancona, MD On 10/12/2015.   Specialty:  Cardiology   Why:  at 200 pm for post hospital follow up. Please bring all of your medications to your appointment. The code for parking is 0010.  Contact information:   [address] [place] [address] [phone]       Follow up with [facility].   Why:  Will arrange for [facility] to draw BMET on 10/08/15 and fax to HF clinic.        The results of significant diagnostics from this hospitalization (including imaging, microbiology, ancillary and laboratory) are listed below for reference.    Significant Diagnostic Studies: Dg Chest 2 View  09/24/2015  CLINICAL DATA:  Acute on chronic shortness of breath, worsening today. EXAM: CHEST  2 VIEW COMPARISON:  10/14/2014 FINDINGS: Cardiomegaly and mile interstitial opacities are identified compatible with mild interstitial edema. There are trace bilateral pleural effusions present. CABG changes are present. There is no evidence of pneumothorax or acute bony abnormality. IMPRESSION: Cardiomegaly with mild interstitial pulmonary edema and trace bilateral pleural effusions. Electronically Signed   By: Harmon Pier M.D.   On: 09/24/2015 17:34    Microbiology: No results found for this or any previous visit (from the past 240 hour(s)).   Labs: Basic Metabolic Panel: No  results for input(s): NA, K, CL, CO2, GLUCOSE, BUN, CREATININE, CALCIUM, MG, PHOS in the last 168 hours. Liver Function Tests: No results for input(s): AST, ALT, ALKPHOS, BILITOT, PROT, ALBUMIN in the last 168 hours. No results for input(s): LIPASE, AMYLASE in the last 168 hours. No results for input(s): AMMONIA in the last 168 hours. CBC: No results for input(s): WBC, NEUTROABS, HGB, HCT, MCV, PLT in the last 168 hours. Cardiac Enzymes: No results for input(s): CKTOTAL, CKMB, CKMBINDEX, TROPONINI in the last 168 hours. BNP: BNP (last 3 results)  Recent Labs  09/14/15 1519 09/24/15 1605 09/29/15 0428  BNP 208.5* 436.5* 165.4*    ProBNP (last 3 results)  Recent Labs  01/09/15 0907  PROBNP 245.0*    CBG: No results for input(s): GLUCAP in the last 168 hours.     SignedZannie Cove MD.  [facility] 10/14/2015, 3:40 PM

## 2015-10-15 ENCOUNTER — Ambulatory Visit (INDEPENDENT_AMBULATORY_CARE_PROVIDER_SITE_OTHER): Payer: Medicare Other | Admitting: Internal Medicine

## 2015-10-15 ENCOUNTER — Encounter: Payer: Self-pay | Admitting: Internal Medicine

## 2015-10-15 VITALS — Temp 97.6°F | Ht 64.0 in | Wt 187.0 lb

## 2015-10-15 VITALS — BP 132/68 | HR 67 | Ht 64.0 in | Wt 185.6 lb

## 2015-10-15 DIAGNOSIS — I330 Acute and subacute infective endocarditis: Secondary | ICD-10-CM

## 2015-10-15 DIAGNOSIS — J449 Chronic obstructive pulmonary disease, unspecified: Secondary | ICD-10-CM

## 2015-10-15 DIAGNOSIS — I5031 Acute diastolic (congestive) heart failure: Secondary | ICD-10-CM | POA: Diagnosis not present

## 2015-10-15 NOTE — Assessment & Plan Note (Signed)
I do not believe that she has active endocarditis and recommend continued observation off of antibiotics. I would not do any other diagnostic workup as long as she is free of signs or symptoms of infection. She can follow-up here as needed.

## 2015-10-15 NOTE — Progress Notes (Signed)
[facility]  Patient Active Problem List   Diagnosis Date Noted  . 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 Hypokalemia 10/03/2015  . AKI (acute kidney injury) (HCC) 10/03/2015  . Weakness   . Acute on chronic diastolic (congestive) heart failure (HCC) 09/24/2015  . Acute diastolic (congestive) heart failure (HCC) 09/24/2015  . Lumbar spine scoliosis 04/18/2015  . Mitral stenosis 02/10/2015  . Chronic diastolic heart failure (HCC) 05/10/2013  . Atrial flutter (HCC) 05/10/2013  . Chronic anticoagulation 05/10/2013  . HTN (hypertension) 05/10/2013  . Dyslipidemia, goal LDL below 70 05/10/2013  . Seasonal allergic rhinitis 04/11/2012  . Obstructive sleep apnea 04/28/2008  . DYSPNEA 03/14/2008  . Coronary atherosclerosis 02/27/2008  . COPD (chronic obstructive pulmonary disease) with emphysema (HCC) 02/27/2008  . Pulmonary hypertension (HCC) 02/21/2008  . DIASTOLIC DYSFUNCTION 02/21/2008    Patient's Medications  New Prescriptions   No medications on file  Previous Medications   ACETAMINOPHEN (TYLENOL) 500 MG TABLET    Take 1,000 mg by mouth daily as needed for headache.   ALENDRONATE (FOSAMAX) 70 MG TABLET    Take 70 mg by mouth once a week. Reported on 10/12/2015   AZELASTINE (ASTELIN) 0.1 % NASAL SPRAY    Place 1 spray into both nostrils 2 (two) times daily. Use in each nostril as directed   CALCIUM-VITAMIN D (OSCAL WITH D) 500-200 MG-UNIT PER TABLET    Take 2 tablets by mouth daily with breakfast.   GABAPENTIN (NEURONTIN) 300 MG CAPSULE    Take 300 mg by mouth 2 (two) times daily.   LORATADINE (CLARITIN) 10 MG TABLET    Take 10 mg by mouth daily.   METOPROLOL (LOPRESSOR) 50 MG TABLET    Take 1 tablet by mouth two  times daily   NITROGLYCERIN (NITROSTAT) 0.4 MG SL TABLET    Place 1 tablet (0.4 mg total) under the tongue every 5 (five) minutes as needed for chest pain.   OMEPRAZOLE (PRILOSEC OTC) 20 MG TABLET    Take 20 mg by  mouth at bedtime as needed (GERD).    OXYGEN    Inhale 2 L into the lungs continuous.   POTASSIUM CHLORIDE SA (K-DUR,KLOR-CON) 20 MEQ TABLET    Take 2 tablets (40 mEq total) by mouth 2 (two) times daily.   RIVAROXABAN (XARELTO) 20 MG TABS TABLET    Take 1 tablet (20 mg total) by mouth at bedtime.   SERTRALINE (ZOLOFT) 100 MG TABLET    Take 100 mg by mouth at bedtime.    SIMVASTATIN (ZOCOR) 40 MG TABLET    Take 1 tablet by mouth  every evening   SPIRONOLACTONE (ALDACTONE) 25 MG TABLET    Take 1 tablet (25 mg total) by mouth daily.   TORSEMIDE (DEMADEX) 20 MG TABLET    Take 2 tablets (40 mg total) by mouth 2 (two) times daily.  Modified Medications   No medications on file  Discontinued Medications   No medications on file    Subjective: [name] is in with her granddaughter, [name], for her hospital follow-up visit. She is a 75 y.o. female with multiple medical problems who has suffered from heart failure and worsening shortness of breath over the past several months. She was referred for right and left heart catheterization. 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 There was only mild aortic insufficiency  and mild aortic stenosis. 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 She was told that she had rheumatic heart disease and a heart murmur as a young woman.  She was admitted to the hospital. She remained afebrile throughout her hospitalization. One of 6 sets of blood cultures grew coagulase negative staph which I believed was an insignificant contaminant. She had no other clinical evidence of active endocarditis and was discharged home off of antibiotics. She is feeling stronger. She has not had any fever, chills or sweats. Her appetite remains good. Her dyspnea on exertion is unchanged.  Review of Systems: Review of Systems  Constitutional: Negative for fever, chills,  weight loss, malaise/fatigue and diaphoresis.  HENT: Negative for sore throat.   Respiratory: Positive for shortness of breath. Negative for cough and sputum production.   Cardiovascular: Negative for chest pain.  Skin: Negative for rash.  Neurological: Negative for dizziness and headaches.    Past Medical History  Diagnosis Date  . COPD (chronic obstructive pulmonary disease) (HCC)   . Pulmonary HTN (HCC)     PASP 50-52mmHg by [id number]  . History of rheumatic heart disease     X 3-LAST TIME WHEN PATIENT WAS 75 YRS OLD  . Sleep apnea     intolerant to CPAP  . Hypertension   . Bursitis, shoulder     bilateral  . GERD (gastroesophageal reflux disease)   . H/O hiatal hernia   . Anxiety   . Depression   . Varicose veins   . PUD (peptic ulcer disease)   . 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 DJD (degenerative joint disease)   . OA (osteoarthritis)   . Hyperlipidemia   . Urinary incontinence   . Pulmonary HTN (HCC)   . Thyroid nodule   . CHF (congestive heart failure) (HCC)     chronic diasotlic CHF  . Complication of anesthesia     "have trouble getting me awake sometimes; been ok latelhy" (09/24/2015)  . On home oxygen therapy     "2L; 24/7" (09/24/2015)    Social History  Substance Use Topics  . Smoking status: Former Smoker -- 1.00 packs/day for 47 years    Types: Cigarettes    Quit date: 07/26/2007  . Smokeless tobacco: Never Used  . Alcohol Use: No    Family History  Problem Relation Age of Onset  . Anemia Mother   . Emphysema Father   . Esophageal cancer Daughter     Allergies  Allergen Reactions  . Prednisone Rash    jittery  . Xopenex [Levalbuterol]     Made mouth and throat sore  . Cefpodoxime Other (See Comments)    Yeast infections   . Clindamycin Other (See Comments)    Unknown   . Levalbuterol Hcl Other (See Comments)    Unknown   . Lipitor [Atorvastatin]     Other reaction(s): Other (See Comments) Made joint start hurting  Muscle  aches  . Other Swelling    Venholin HFA  . Penicillins Other (See Comments)    Has patient had a PCN reaction causing immediate rash, facial/tongue/throat swelling, SOB or lightheadedness with hypotension: unknown, childhood reaction Has patient had a PCN reaction causing severe rash involving mucus membranes or skin necrosis: No Has patient had a PCN reaction that required hospitalization No Has patient had a PCN reaction occurring within the last 10 years: No If all of the above answers are "NO",  then may proceed with Cephalosporin use.   . Tape Other (See Comments)    Bleeding  . Ventolin [Kdc:Albuterol]     "jittery"  . Codeine Other (See Comments)    anxious jittery  . Hydrocodone-Acetaminophen Anxiety  . Latex Itching and Rash    Objective: Filed Vitals:   10/15/15 1610  Temp: 97.6 F (36.4 C)  TempSrc: Oral  Height: 5\' 4"  (1.626 m)  Weight: 187 lb (84.823 kg)  SpO2: 94%   Body mass index is 32.08 kg/(m^2).  Physical Exam  Constitutional: She is oriented to person, place, and time.  She is smiling and in good spirits. She is on supplemental nasal prong oxygen at 3 L/m. She is using a rolling walker.  Cardiovascular: Normal rate and regular rhythm.   Murmur heard. No change in 2/6 systolic murmur  Neurological: She is alert and oriented to person, place, and time.  Skin: No rash noted.  Psychiatric: Mood and affect normal.    Lab Results    Problem List Items Addressed This Visit      High   Aortic valve vegetation - Primary    I do not believe that she has active endocarditis and recommend continued observation off of antibiotics. I would not do any other diagnostic workup as long as she is free of signs or symptoms of infection. She can follow-up here as needed.          Cliffton Asters, MD [facility] [phone] pager   [phone] cell 10/15/2015, 5:00 PM

## 2015-10-15 NOTE — Patient Instructions (Signed)
Ok to increase your portable oxygen to 3 as needed, while keeping 2L/m at rest  Please call if we can help

## 2015-10-15 NOTE — Progress Notes (Signed)
Patient ID: [name], female    DOB: 05-12-1941, 75 y.o.   MRN: [id number]  HPI 11/26/10-SATURATION QUALIFICATIONS: Patient Saturations on Room Air while Ambulating = 87% Patient Saturations on 2 Liters of oxygen while Ambulating = 96%[name],CMA   11/26/10 HPI: 75 yoF followed for COPD, sleep apnea, complicated by pulmonary hypertension. Last here July 29, 2010. PFT reviewed then- severe COPD FEV1 0.88/ 38%. Pollen season causing watery nose.  She took advice from a tv ad and changed her O2 DME to a [employer], but now wants to change back to [facility]. She continues O2 mostly at night. Wearing heart monitor because of tachypalpitation/ Dr Mayford Knife.. Back pain/ DGD limits her acitivity as much as her dyspnea.  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   04/01/11-  75 yoF followed for COPD, sleep apnea, complicated by pulmonary hypertension. Heart monitor results ok- Dr Mayford Knife- for tachypalpitation. Back pain was slowing her down. She got first epidural steroid injection this week.  Dr Michaell Cowing was following for THYROID nodule with hx partial thyroidectomy.  Flu vax talk.  She stays in when hot outdoors, rather than bothering with her portable oxygen. Continues O2 2L [facility] for sleep. Feels she is breathing some better lately, not worse. Says CPAP broke her face out- failed to tolerate.   11/30/11-  75 yoF followed for COPD, sleep apnea, complicated by pulmonary hypertension. PCP Dr Maurice Small, Card- Dr Renelda Mom hospital visit. Hospitalized April 10-14 according to family although discharge summary indicates April 10 through April 18. 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 History of intolerance to CPAP. CODE STATUS during this hospitalization was DO NOT RESUSCITATE. She and her daughter indicate that  she is feeling much better. Now off prednisone, oxygen added.  She describes a new dry cough and is noted to be on an ACE inhibitor which we discussed as one possible cause for the cough. Home oxygen continuous and portable 2 L/[facility]. CXR- 11/04/11- images reviewed IMPRESSION:  Volume loss at the left lung base. Streaky opacity is favored to  reflect atelectasis but infection is difficult to exclude. No  pulmonary edema or pleural effusions.  Original Report Authenticated By: [name], M.D.  ECHO- EF 65-70%. Grade 2 diastolic dysfunction. Mild aortic valve regurgitation. Mild to moderate mitral stenosis with mild mitral regurgitation. Left atrium mildly dilated.  04/03/12-  75 yoF followed for COPD, sleep apnea/ failed CPAP, complicated by pulmonary hypertension. PCP Dr Maurice Small, Card- Dr Mayford Knife Breathing at baseline, using O2 2L/ [facility] for sleep. She notices that her upper eyelids get red without irritation of the globes. She has been having fall season related nasal congestion. Using no regular medication, just her oxygen. She failed to tolerate CPAP and just wears oxygen.  08/27/12- 71 yoF followed for COPD, sleep apnea/ failed CPAP, complicated by pulmonary hypertension. PCP Dr Maurice Small, Card- Dr Donneta Romberg FOR: still SOB at times; coughs when feels nose is running Blames cough on post nasal drip noted to "allergy". Benadryl over dries and makes head itch. Wearing oxygen 2 L/Advanced for sleep. Little cough. No chest pain, swelling, palpitation or blood.  10/26/12-  75 yoF followed for COPD, sleep apnea/ failed CPAP, complicated by pulmonary hypertension. PCP Dr Maurice Small, Card- Dr Mayford Knife   Family here. Follow up to discuss back surgery.  Reports breathing has worsened over  the past week.  Has SOB with very little activity, chest tightness with SOB, runny nose with clear drainage, and nonprod cough.   Gradually increasing dyspnea on exertion, limited as much by  weakness. Some cough, no wheeze, nose drips. No chest pain. Allegra helps rhinitis. Continuous O2 2L/min/ [facility].  Feet swell, left more than right-chronic, dependent. No calf pain and no acute change. No new heart problems/Dr. Turner/[facility].  12/04/12- 72 yoF followed for COPD, sleep apnea/ failed CPAP, complicated by pulmonary hypertension. PCP Dr Maurice Small, Card- Dr Mayford Knife     Daughter here ACUTE VISIT: runny nose, sore throat, cough-gagging-non productive, chills. Son has recently been sick.  Increased SOB as well. 1 week frontal headache, watery rhinorhea, scratchy throat, dry cough. Began w/ fever/ chill. O2 2L/ [facility] CXR 10/29/12 Findings: The heart is again enlarged in size. Postsurgical  changes are again seen. The lungs are well-aerated bilaterally  without focal confluent infiltrate. No sizable effusion is noted.  IMPRESSION:  No acute abnormalities seen.  Original Report Authenticated By: [name], M.D.  02/25/13- 72 yoF followed for COPD, sleep apnea/ failed CPAP, complicated by pulmonary hypertension, CAD/ Hx MI. PCP Dr Maurice Small, Card- Dr Mayford Knife     Daughter here c/o feeling fatigued, dizziness,  and increased DOE x 2 wks.  No cough, wheezing, chest tightness, or chest pain noted at this time. Note med list includes Spiriva and [place]. O2 2L/ Advanced She thinks she has been getting too much Lasix, 80 mg daily. Dr. Mayford Knife is away and her office wants to send her to ER. Frequent bathroom trips, orthostatic. Not taking either Tudorza or Spiriva.no infection. Back surgery with no respiratory problem June.  08/29/13- 72 yoF followed for COPD, sleep apnea/ failed CPAP, complicated by pulmonary hypertension, CAD/ Hx MI. PCP Dr Maurice Small, Card- Dr Mayford Knife     Daughter here Follows For: SOB worse on exertion - Diff carrying purse and managing oxygen tanks - Wears oxygen all the timewhen at home - Denies cough or wheezing Exercise here- room air walking 08/29/13- desat to  88%. Says her oxygen tank is too heavy and asks about portable concentrator.  10/10/13-73 yoF followed for COPD, sleep apnea/ failed CPAP, complicated by pulmonary hypertension, CAD/ Hx MI. PCP Dr Maurice Small, Card- Dr Mayford Knife     FOLLOWS FOR: SOB when walking; hard to carry the O2 tanks-insurance denied for her to have portable tanks.  O2 2L/ Advanced sleep and portable Daughter had esophageal cancer and just died from brain metastasis. Husband is BKA in wheelchair. She is having trouble coping. Patient is finding her current portable oxygen too heavy Reports little cough or wheeze.  04/15/14- 73 yoF followed for COPD, sleep apnea/ failed CPAP, complicated by pulmonary hypertension, CAD/ Hx MI. PCP Dr Maurice Small, Card- Dr Mayford Knife FOLLOWS FOR: Pt c/o runny nose, SOB with exertion, sinus congestion, PND, cough due to the PND. O2 2L/ Advanced  10/14/14- 74 yoF followed for COPD, sleep apnea/ failed CPAP, complicated by pulmonary hypertension, CAD/ Hx MI. PCP Dr Maurice Small, Card- Dr Donneta Romberg FOR: Pt states she has SOB with activity; pt also states she has noticed her Right hip protruding out-unsure if this is causing her to walk different which is causing her breathing issues. O2 2L/ [employer] for sleep only, but sometimes wears it to rest in the daytime. Watery rhinorrhea now in early spring weather.   04/16/15- 27 yoF former smoker followed for COPD, sleep apnea/ failed CPAP, complicated by pulmonary hypertension,  CAD/ Hx MI. PCP Dr Maurice Small, Card- Dr Donneta Romberg FOR: pt c/o worsening DOE, runny nose, PND.  claritin does not help s/s.   O2 2 L/[employer] continuous and portable Notices dyspnea especially if she is carrying a load in her arms or lifting something over her head. Asks me to notice that right hip juts out since she fell during the summer. CXR 10/14/14-I reviewed with her IMPRESSION: 1. Prior CABG. 2. Cardiomegaly with mild pulmonary vascular prominence  and interstitial prominence suggesting mild congestive heart failure. Small left pleural effusion cannot be excluded. Electronically Signed  By: Maisie Fus Register  On: 10/14/2014 13:49  10/15/2015-75 year old female former smoker followed for COPD, OSA/failed CPAP, complicated by pulmonary hypertension, CHF, CAD/history MI O2 2L [facility] 3/11-/13 for hypokalemia complicating her cardiac disease FOLLOWS FOR: pt states breathing has worsened. increased SOB, occ dry cough, nasal drainage. denies wheezing or chest pain/tightness . Sat today 96% on arrival. Weight up 8 pounds since 3/11 Recent hospitalization for CHF then back for weakness/hypokalemia. Has stayed weak. She says chest feels "fine" and denies wheeze or cough. Stable on oxygen 2 L. Home health nurse visits twice weekly. CXR 09/24/2015 IMPRESSION: Cardiomegaly with mild interstitial pulmonary edema and trace bilateral pleural effusions. Electronically Signed  By: Harmon Pier M.D.  On: 09/24/2015 17:34  Review of Systems- see HPI Constitutional:   + weight loss, night sweats, no-Fevers, chills,No- fatigue, lassitude. HEENT:   + headaches,  No-Difficulty swallowing,  Tooth/dental problems, Sore throat,                No-sneezing, itching, ear ache, + nasal congestion, CV:  No chest pain, orthopnea, PND, +swelling in lower extremities, no-anasarca, dizziness, palpitations GI  No heartburn, indigestion, abdominal pain, nausea, vomiting, ,  Resp:   No excess mucus, no productive cough,  + non-productive cough,  No coughing up of blood.    No change in color of mucus.  No wheezing.   Skin: no rash or lesions. GU: . MS:  No joint pain or swelling.   Psych:  No change in mood or affect. No depression +anxiety.  No memory loss.  Objective:   Physical Exam  General- +Alert, Oriented, Affect-appropriate, Distress- none acute        On O2 2L sat 96% at rest   Wheelchair. Skin- rash-none, lesions- none, excoriation-  none Lymphadenopathy- none Head- atraumatic            Eyes- Gross vision intact, PERRLA, conjunctivae clear secretions            Ears- Hearing, canals normal            Nose- Clear, No-septal dev, mucus, polyps, erosion, perforation             Throat- Mallampati III , mucosa clear/ dry , drainage- none, tonsils- atrophic.                        Large tongue. Neck- flexible , trachea midline, no stridor , thyroid nl, carotid no bruit Chest - symmetrical excursion , unlabored           Heart/CV- + RR/ frequent skipped beats , no murmur heard today , no gallop  , no rub, nl s1 s2                           - JVD- none , edema-none, stasis changes- none, varices- none.  Lung-  Clear to P&A, wheeze- none , dullness-none, rub- none           Chest wall-  Abd-        + lumbar scoliosis/ juts right hip Br/ Gen/ Rectal- Not done, not indicated Extrem- cyanosis- none, clubbing, none, atrophy- none, strength- nl Neuro- grossly intact to observation  Assessment & Plan:

## 2015-10-16 ENCOUNTER — Other Ambulatory Visit (HOSPITAL_COMMUNITY): Payer: Self-pay | Admitting: Cardiology

## 2015-10-16 NOTE — Assessment & Plan Note (Signed)
She does not have neck vein distention or significant ankle edema at this visit but recognizes 8 pound weight gain corresponds to slow increase in dyspnea. She has pending follow-up with cardiology.

## 2015-10-16 NOTE — Assessment & Plan Note (Signed)
She has known significant COPD with chronic hypoxic respiratory failure. Lungs at this visit are clear and she denies cough or wheeze. Recent chest x-ray points to mild CHF as most likely basis for some increase in dyspnea on exertion, reinforced by recognition of a pound weight gain. Plan-no pulmonary medicine changed to suggest. Keep upcoming appointment with cardiology.

## 2015-10-19 ENCOUNTER — Other Ambulatory Visit (HOSPITAL_COMMUNITY): Payer: Self-pay | Admitting: Cardiology

## 2015-10-19 MED ORDER — TORSEMIDE 20 MG PO TABS: 40.0000 mg | ORAL_TABLET | Freq: Two times a day (BID) | ORAL | Status: DC

## 2015-10-19 NOTE — Telephone Encounter (Signed)
Patient called to request rx to mail order pharmacy

## 2015-10-20 ENCOUNTER — Telehealth (HOSPITAL_COMMUNITY): Payer: Self-pay

## 2015-10-20 NOTE — Telephone Encounter (Signed)
[name] with [facility] called to report that patient has declined HH PT at this time, stating she does nto feel she needs this service.  [name]

## 2015-10-27 ENCOUNTER — Ambulatory Visit (HOSPITAL_COMMUNITY)
Admission: RE | Admit: 2015-10-27 | Discharge: 2015-10-27 | Disposition: A | Discharge status: Home / Self Care | Payer: Medicare Other | Source: Ambulatory Visit | Attending: Internal Medicine | Admitting: Internal Medicine

## 2015-10-27 ENCOUNTER — Encounter (HOSPITAL_COMMUNITY): Payer: Self-pay

## 2015-10-27 VITALS — BP 118/70 | HR 78 | Wt 183.0 lb

## 2015-10-27 DIAGNOSIS — I050 Rheumatic mitral stenosis: Secondary | ICD-10-CM | POA: Insufficient documentation

## 2015-10-27 DIAGNOSIS — J449 Chronic obstructive pulmonary disease, unspecified: Secondary | ICD-10-CM | POA: Diagnosis not present

## 2015-10-27 DIAGNOSIS — E785 Hyperlipidemia, unspecified: Secondary | ICD-10-CM | POA: Diagnosis not present

## 2015-10-27 DIAGNOSIS — I480 Paroxysmal atrial fibrillation: Secondary | ICD-10-CM | POA: Insufficient documentation

## 2015-10-27 DIAGNOSIS — I10 Essential (primary) hypertension: Secondary | ICD-10-CM | POA: Diagnosis not present

## 2015-10-27 DIAGNOSIS — Z825 Family history of asthma and other chronic lower respiratory diseases: Secondary | ICD-10-CM | POA: Insufficient documentation

## 2015-10-27 DIAGNOSIS — Z87891 Personal history of nicotine dependence: Secondary | ICD-10-CM | POA: Insufficient documentation

## 2015-10-27 DIAGNOSIS — F329 Major depressive disorder, single episode, unspecified: Secondary | ICD-10-CM | POA: Insufficient documentation

## 2015-10-27 DIAGNOSIS — K219 Gastro-esophageal reflux disease without esophagitis: Secondary | ICD-10-CM | POA: Diagnosis not present

## 2015-10-27 DIAGNOSIS — I272 Other secondary pulmonary hypertension: Secondary | ICD-10-CM | POA: Diagnosis not present

## 2015-10-27 DIAGNOSIS — F419 Anxiety disorder, unspecified: Secondary | ICD-10-CM | POA: Diagnosis not present

## 2015-10-27 DIAGNOSIS — Z79899 Other long term (current) drug therapy: Secondary | ICD-10-CM | POA: Diagnosis not present

## 2015-10-27 DIAGNOSIS — I5032 Chronic diastolic (congestive) heart failure: Secondary | ICD-10-CM | POA: Diagnosis present

## 2015-10-27 DIAGNOSIS — Z951 Presence of aortocoronary bypass graft: Secondary | ICD-10-CM | POA: Diagnosis not present

## 2015-10-27 DIAGNOSIS — I110 Hypertensive heart disease with heart failure: Secondary | ICD-10-CM | POA: Insufficient documentation

## 2015-10-27 DIAGNOSIS — G4730 Sleep apnea, unspecified: Secondary | ICD-10-CM | POA: Diagnosis not present

## 2015-10-27 DIAGNOSIS — I330 Acute and subacute infective endocarditis: Secondary | ICD-10-CM | POA: Diagnosis not present

## 2015-10-27 DIAGNOSIS — Z7901 Long term (current) use of anticoagulants: Secondary | ICD-10-CM | POA: Insufficient documentation

## 2015-10-27 DIAGNOSIS — Z9981 Dependence on supplemental oxygen: Secondary | ICD-10-CM | POA: Diagnosis not present

## 2015-10-27 DIAGNOSIS — G4733 Obstructive sleep apnea (adult) (pediatric): Secondary | ICD-10-CM | POA: Diagnosis not present

## 2015-10-27 DIAGNOSIS — I2510 Atherosclerotic heart disease of native coronary artery without angina pectoris: Secondary | ICD-10-CM | POA: Insufficient documentation

## 2015-10-27 NOTE — Progress Notes (Signed)
Patient ID: [name], female   DOB: 05/03/1941, 75 y.o.   MRN: [id number]    Advanced Heart Failure Clinic Note   PCP: Dr Valentina Lucks  Primary Cardiologist: Dr Mayford Knife  Pulmonary: Dr Maple Hudson HF Cardiology: Dr. Shirlee Latch  HPI: [name] is a 75 y.o. female with a history of chronic diastolic CHF, moderate pulmonary HTN, HTN, CAD s/p CABG, COPD on home oxygen, and dyslipidemia referred to the HF clinic by Dr Mayford Knife.  She has developed worsening dyspnea with exertion over the last few months.   Admitted 3/2 - 09/30/15 with A/C CHF. TEE was done to assess mitral stenosis, appeared mild to moderate, rheumatic valve.  TEE also showed aortic valve vegetation and concern arose for endocarditis with recent "tooth infection" per pt.  ID saw. 1 of 2 admission cultures with Gram + cocci in clusters thought to be contaminant.  2 further sets of BCx negative. RHC showed elevated right and left heart filling pressures.  Aortic valve was not crossed for full mitral stenosis assessment due to aortic valve vegetation. Diuresed 8 lbs. Discharge weight 175 lbs.   She returns today for regular follow up.  Weight is down 2 lbs from her last visit. Weight at home 181- 183. Breathing stable on 02. NO SOB getting around the house. Has mild SOB with showering since she can't use the oxygen. Uses walker to get at home. Turned down physical therapy, theres nothing she wants to do that she feels like she can't. Denies orthopnea/PND. Denies lightheadedness or dizziness.   - ECHO 12/2014 EF 55-60%, peak PA pressure 68 mmHg, mild to moderate MS. - ECHO 2/17 EF 60-65%, mild LVH, mildly dilated RV with mildly decreased systolic function, PASP 29 mmHg (not sure we got a full envelope), moderate mitral stenosis with mean gradient 6 mmHg and MVA 1.37 cm^2, mild MR, mild AI, aortic sclerosis without significant stenosis.   - TEE 09/24/15 EF 60%, small 0.8 cm mobile mass on the LV side of the aortic valve. Mild AI and AS. 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 A small PFO with a weakly positive bubble study, Moderate TR  RHC 09/24/15 (did not do LHC and cross valve to assess mitral stenosis due to aortic valve vegetation): Hemodynamics (mmHg) RA mean 16 RV 65/14 PA 71/28, mean 45 PCWP mean 32 Cardiac Output (Fick) 4.85  Cardiac Index (Fick) 2.51 PVR 2.68 WU  ECG: NSR, RBBB  Labs 01/09/2015 K 4.3 Creatinine 0.91 BNP  Labs 02/19/2015 K 4.1 Creatinine 1.03 Labs 10/05/15 K 3.8, Creatinine 0.89  Labs 10/08/15 K 4.6, Creatinine 0.7 Labs 10/22/15 K 4.8, Creatinine 0.8  Social History: The patient  reports that she quit smoking about 7 years ago. Her smoking use included Cigarettes. She has a 47 pack-year smoking history. She has never used smokeless tobacco. She reports that she does not drink alcohol or use illicit drugs.   Family History: The patient's family history includes Anemia in her mother; Emphysema in her father; Esophageal cancer in her daughter.   ROS: All systems negative except as listed in HPI, PMH and Problem List.  SH:  Social History   Social History  . Marital Status: Married    Spouse Name: N/A  . Number of Children: N/A  . Years of Education: N/A   Occupational History  . Not on file.   Social History Main Topics  . Smoking status: Former Smoker -- 1.00 packs/day for 47  years    Types: Cigarettes    Quit date: 07/26/2007  . Smokeless tobacco: Never Used  . Alcohol Use: No  . Drug Use: No  . Sexual Activity: No   Other Topics Concern  . Not on file   Social History Narrative   Husband bilateral left amputee for atheorsclerotic disease    FH:  Family History  Problem Relation Age of Onset  . Anemia Mother   . Emphysema Father   . Esophageal cancer Daughter     Past Medical History  Diagnosis Date  . COPD (chronic obstructive pulmonary disease) (HCC)   . Pulmonary HTN (HCC)     PASP 50-34mmHg by [id number]  . History of  rheumatic heart disease     X 3-LAST TIME WHEN PATIENT WAS 75 YRS OLD  . Sleep apnea     intolerant to CPAP  . Hypertension   . Bursitis, shoulder     bilateral  . GERD (gastroesophageal reflux disease)   . H/O hiatal hernia   . Anxiety   . Depression   . Varicose veins   . PUD (peptic ulcer disease)   . 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 DJD (degenerative joint disease)   . OA (osteoarthritis)   . Hyperlipidemia   . Urinary incontinence   . Pulmonary HTN (HCC)   . Thyroid nodule   . CHF (congestive heart failure) (HCC)     chronic diasotlic CHF  . Complication of anesthesia     "have trouble getting me awake sometimes; been ok latelhy" (09/24/2015)  . On home oxygen therapy     "2L; 24/7" (09/24/2015)    Current Outpatient Prescriptions  Medication Sig Dispense Refill  . acetaminophen (TYLENOL) 500 MG tablet Take 1,000 mg by mouth daily as needed for headache.    Marland Kitchen azelastine (ASTELIN) 0.1 % nasal spray Place 1 spray into both nostrils 2 (two) times daily. Use in each nostril as directed    . calcium-vitamin D (OSCAL WITH D) 500-200 MG-UNIT per tablet Take 2 tablets by mouth daily with breakfast.    . gabapentin (NEURONTIN) 300 MG capsule Take 300 mg by mouth 2 (two) times daily.    Marland Kitchen loratadine (CLARITIN) 10 MG tablet Take 10 mg by mouth daily.    . metoprolol (LOPRESSOR) 50 MG tablet Take 1 tablet by mouth two  times daily 180 tablet 1  . omeprazole (PRILOSEC OTC) 20 MG tablet Take 20 mg by mouth at bedtime as needed (GERD).     . OXYGEN Inhale 2 L into the lungs continuous.    . potassium chloride SA (K-DUR,KLOR-CON) 20 MEQ tablet Take 2 tablets (40 mEq total) by mouth 2 (two) times daily. 120 tablet 3  . rivaroxaban (XARELTO) 20 MG TABS tablet Take 1 tablet (20 mg total) by mouth at bedtime. 90 tablet 2  . sertraline (ZOLOFT) 100 MG tablet Take 100 mg by mouth at bedtime.     . simvastatin (ZOCOR) 40 MG tablet Take 1 tablet by mouth  every evening 90 tablet 0  .  spironolactone (ALDACTONE) 25 MG tablet Take 1 tablet (25 mg total) by mouth daily. 30 tablet 6  . torsemide (DEMADEX) 20 MG tablet Take 2 tablets (40 mg total) by mouth 2 (two) times daily. 360 tablet 3  . alendronate (FOSAMAX) 70 MG tablet Take 70 mg by mouth once a week. Reported on 10/27/2015  0  . nitroGLYCERIN (NITROSTAT) 0.4 MG SL tablet Place 1 tablet (0.4  mg total) under the tongue every 5 (five) minutes as needed for chest pain. (Patient not taking: Reported on 10/27/2015) 25 tablet 3   No current facility-administered medications for this encounter.    Filed Vitals:   10/27/15 1345  BP: 118/70  Pulse: 78  Weight: 183 lb (83.008 kg)  SpO2: 97%   Wt Readings from Last 3 Encounters:  10/27/15 183 lb (83.008 kg)  10/15/15 187 lb (84.823 kg)  10/15/15 185 lb 9.6 oz (84.188 kg)     PHYSICAL EXAM:  General:  NAD, wearing oxygen. HEENT: normal Neck: supple. JVP 6-7 with mild HJR. Carotids 2+ bilaterally; no bruits. No thyromegaly or nodule noted.  Cor: PMI normal. RRR. No rubs, gallops.  2/6 SEM RUSB.  Lungs: Clear, normal effort On 2 liters [place] Abdomen: soft, NT, ND, no HSM. No bruits or masses. +BS  Extremities: no cyanosis, clubbing, rash.  No edema.  Neuro: alert & orientedx3, cranial nerves grossly intact. Moves all 4 extremities w/o difficulty. Affect pleasant.  ASSESSMENT & PLAN: 1. Chronic diastolic CHF/valvular heart disease: TEE in 3/17 showed normal EF, mild to moderate mitral stenosis.  RHC showed elevated left and right heart filling pressures. NYHA IIIb symptoms.  She still appears to have mild volume overload.  - Continue torsemide 40 mg BID and potassium 40 meq BID     - Continue 25 mg spironolactone daily.  - Discussed limiting fluid intake to < 2 liters per day and low salt diet.  2. CAD S/P CABG: No worrisome chest pain. Continue statin and metoprolol. On Xarelto so no aspirin.  3. Atrial fibrillation: Paroxysmal. CHADSVASC score is 4 (age > 72, female, HTN,  CHF).  She remains in NSR.  - Continue Xarelto/metoprolol.  4. Pulmonary HTN: This is primarily pulmonary venous hypertension based on most recent RHC. 5. COPD: Followed by Dr Maple Hudson. On 2 liters [place].  - Saw Dr young 10/15/15. Stable. No med changes amde.  6.  OSA: intolerant CPAP 7.  Mitral stenosis: Rheumatic mitral stenosis.  Mild to moderate mitral stenosis by TEE in 3/17.  Unable to assess by cath because she had aortic valve vegetation and the valve was not crossed. Treating medically for now.  Would be high risk for surgery with severe COPD.  However, possibly could be valvuloplasty candidate.    8. Aortic valve vegetation: No fever, blood cultures negative.  May have come from prior dental infection.  ID did not recommend antibiotics.  - Saw Dr Orvan Falconer 10/15/15. Does not have active endocarditis and he recommends continued observations off of antibiotics. No further work up recommended at this time.   She is stable on current medications.  Will follow up with MD 3 months. Knows to call with any worsening symptoms.   Mariam Dollar Tillery PA-C 10/27/2015

## 2015-10-27 NOTE — Patient Instructions (Signed)
Follow up with [name] in 3 months.

## 2015-10-30 ENCOUNTER — Other Ambulatory Visit (HOSPITAL_COMMUNITY): Payer: Self-pay | Admitting: Cardiology

## 2015-11-16 ENCOUNTER — Inpatient Hospital Stay (HOSPITAL_COMMUNITY): Payer: Medicare Other

## 2015-11-16 ENCOUNTER — Encounter (HOSPITAL_COMMUNITY): Payer: Self-pay | Admitting: Emergency Medicine

## 2015-11-16 ENCOUNTER — Inpatient Hospital Stay (HOSPITAL_COMMUNITY)
Admission: EM | Admit: 2015-11-16 | Discharge: 2015-11-24 | DRG: 871 | Disposition: A | Discharge status: Home Health | Payer: Medicare Other | Attending: Family Medicine | Admitting: Family Medicine

## 2015-11-16 ENCOUNTER — Telehealth (HOSPITAL_COMMUNITY): Payer: Self-pay | Admitting: Vascular Surgery

## 2015-11-16 DIAGNOSIS — I5032 Chronic diastolic (congestive) heart failure: Secondary | ICD-10-CM | POA: Diagnosis present

## 2015-11-16 DIAGNOSIS — I519 Heart disease, unspecified: Secondary | ICD-10-CM | POA: Diagnosis present

## 2015-11-16 DIAGNOSIS — R0603 Acute respiratory distress: Secondary | ICD-10-CM

## 2015-11-16 DIAGNOSIS — J9621 Acute and chronic respiratory failure with hypoxia: Secondary | ICD-10-CM | POA: Diagnosis not present

## 2015-11-16 DIAGNOSIS — Z9981 Dependence on supplemental oxygen: Secondary | ICD-10-CM

## 2015-11-16 DIAGNOSIS — R7881 Bacteremia: Secondary | ICD-10-CM | POA: Diagnosis not present

## 2015-11-16 DIAGNOSIS — F329 Major depressive disorder, single episode, unspecified: Secondary | ICD-10-CM | POA: Diagnosis present

## 2015-11-16 DIAGNOSIS — Z87891 Personal history of nicotine dependence: Secondary | ICD-10-CM | POA: Diagnosis not present

## 2015-11-16 DIAGNOSIS — J449 Chronic obstructive pulmonary disease, unspecified: Secondary | ICD-10-CM | POA: Diagnosis present

## 2015-11-16 DIAGNOSIS — A419 Sepsis, unspecified organism: Secondary | ICD-10-CM | POA: Diagnosis present

## 2015-11-16 DIAGNOSIS — J9611 Chronic respiratory failure with hypoxia: Secondary | ICD-10-CM | POA: Diagnosis present

## 2015-11-16 DIAGNOSIS — I429 Cardiomyopathy, unspecified: Secondary | ICD-10-CM | POA: Diagnosis present

## 2015-11-16 DIAGNOSIS — R6510 Systemic inflammatory response syndrome (SIRS) of non-infectious origin without acute organ dysfunction: Secondary | ICD-10-CM | POA: Diagnosis present

## 2015-11-16 DIAGNOSIS — Z7901 Long term (current) use of anticoagulants: Secondary | ICD-10-CM | POA: Diagnosis not present

## 2015-11-16 DIAGNOSIS — I426 Alcoholic cardiomyopathy: Secondary | ICD-10-CM | POA: Diagnosis present

## 2015-11-16 DIAGNOSIS — I130 Hypertensive heart and chronic kidney disease with heart failure and stage 1 through stage 4 chronic kidney disease, or unspecified chronic kidney disease: Secondary | ICD-10-CM | POA: Diagnosis present

## 2015-11-16 DIAGNOSIS — Z91048 Other nonmedicinal substance allergy status: Secondary | ICD-10-CM | POA: Diagnosis not present

## 2015-11-16 DIAGNOSIS — N390 Urinary tract infection, site not specified: Secondary | ICD-10-CM | POA: Diagnosis not present

## 2015-11-16 DIAGNOSIS — Z886 Allergy status to analgesic agent status: Secondary | ICD-10-CM

## 2015-11-16 DIAGNOSIS — I739 Peripheral vascular disease, unspecified: Secondary | ICD-10-CM | POA: Diagnosis present

## 2015-11-16 DIAGNOSIS — I272 Other secondary pulmonary hypertension: Secondary | ICD-10-CM | POA: Diagnosis present

## 2015-11-16 DIAGNOSIS — M1990 Unspecified osteoarthritis, unspecified site: Secondary | ICD-10-CM | POA: Diagnosis present

## 2015-11-16 DIAGNOSIS — Z951 Presence of aortocoronary bypass graft: Secondary | ICD-10-CM | POA: Diagnosis not present

## 2015-11-16 DIAGNOSIS — Q2112 Patent foramen ovale: Secondary | ICD-10-CM | POA: Diagnosis present

## 2015-11-16 DIAGNOSIS — K219 Gastro-esophageal reflux disease without esophagitis: Secondary | ICD-10-CM | POA: Diagnosis present

## 2015-11-16 DIAGNOSIS — N179 Acute kidney failure, unspecified: Secondary | ICD-10-CM | POA: Diagnosis present

## 2015-11-16 DIAGNOSIS — F419 Anxiety disorder, unspecified: Secondary | ICD-10-CM | POA: Diagnosis present

## 2015-11-16 DIAGNOSIS — I2510 Atherosclerotic heart disease of native coronary artery without angina pectoris: Secondary | ICD-10-CM | POA: Diagnosis present

## 2015-11-16 DIAGNOSIS — Q211 Atrial septal defect: Secondary | ICD-10-CM | POA: Diagnosis not present

## 2015-11-16 DIAGNOSIS — G4733 Obstructive sleep apnea (adult) (pediatric): Secondary | ICD-10-CM | POA: Diagnosis present

## 2015-11-16 DIAGNOSIS — J9622 Acute and chronic respiratory failure with hypercapnia: Secondary | ICD-10-CM | POA: Diagnosis present

## 2015-11-16 DIAGNOSIS — Z888 Allergy status to other drugs, medicaments and biological substances status: Secondary | ICD-10-CM | POA: Diagnosis not present

## 2015-11-16 DIAGNOSIS — E860 Dehydration: Secondary | ICD-10-CM | POA: Diagnosis present

## 2015-11-16 DIAGNOSIS — B9620 Unspecified Escherichia coli [E. coli] as the cause of diseases classified elsewhere: Secondary | ICD-10-CM | POA: Diagnosis present

## 2015-11-16 DIAGNOSIS — E785 Hyperlipidemia, unspecified: Secondary | ICD-10-CM | POA: Diagnosis present

## 2015-11-16 DIAGNOSIS — Z7983 Long term (current) use of bisphosphonates: Secondary | ICD-10-CM | POA: Diagnosis not present

## 2015-11-16 DIAGNOSIS — Z880 Allergy status to penicillin: Secondary | ICD-10-CM

## 2015-11-16 DIAGNOSIS — I5031 Acute diastolic (congestive) heart failure: Secondary | ICD-10-CM | POA: Diagnosis present

## 2015-11-16 DIAGNOSIS — N10 Acute tubulo-interstitial nephritis: Secondary | ICD-10-CM | POA: Diagnosis present

## 2015-11-16 DIAGNOSIS — A4151 Sepsis due to Escherichia coli [E. coli]: Principal | ICD-10-CM | POA: Diagnosis present

## 2015-11-16 DIAGNOSIS — I330 Acute and subacute infective endocarditis: Secondary | ICD-10-CM | POA: Diagnosis present

## 2015-11-16 DIAGNOSIS — I482 Chronic atrial fibrillation, unspecified: Secondary | ICD-10-CM | POA: Diagnosis present

## 2015-11-16 DIAGNOSIS — R0600 Dyspnea, unspecified: Secondary | ICD-10-CM

## 2015-11-16 DIAGNOSIS — D72829 Elevated white blood cell count, unspecified: Secondary | ICD-10-CM | POA: Diagnosis present

## 2015-11-16 DIAGNOSIS — I050 Rheumatic mitral stenosis: Secondary | ICD-10-CM | POA: Diagnosis present

## 2015-11-16 DIAGNOSIS — I10 Essential (primary) hypertension: Secondary | ICD-10-CM | POA: Diagnosis present

## 2015-11-16 DIAGNOSIS — E876 Hypokalemia: Secondary | ICD-10-CM | POA: Diagnosis not present

## 2015-11-16 DIAGNOSIS — I059 Rheumatic mitral valve disease, unspecified: Secondary | ICD-10-CM | POA: Diagnosis present

## 2015-11-16 DIAGNOSIS — Z91040 Latex allergy status: Secondary | ICD-10-CM

## 2015-11-16 DIAGNOSIS — I5033 Acute on chronic diastolic (congestive) heart failure: Secondary | ICD-10-CM | POA: Diagnosis present

## 2015-11-16 LAB — CBC WITH DIFFERENTIAL/PLATELET
BASOS ABS: 0 10*3/uL (ref 0.0–0.1)
Basophils Absolute: 0 10*3/uL (ref 0.0–0.1)
Basophils Relative: 0 %
Basophils Relative: 0 %
EOS ABS: 0 10*3/uL (ref 0.0–0.7)
EOS PCT: 0 %
Eosinophils Absolute: 0 10*3/uL (ref 0.0–0.7)
Eosinophils Relative: 0 %
HCT: 33.2 % — ABNORMAL LOW (ref 36.0–46.0)
HEMATOCRIT: 33.5 % — AB (ref 36.0–46.0)
HEMOGLOBIN: 9.5 g/dL — AB (ref 12.0–15.0)
Hemoglobin: 9.9 g/dL — ABNORMAL LOW (ref 12.0–15.0)
LYMPHS ABS: 0.4 10*3/uL — AB (ref 0.7–4.0)
LYMPHS PCT: 4 %
LYMPHS PCT: 4 %
Lymphs Abs: 0.8 10*3/uL (ref 0.7–4.0)
MCH: 25.4 pg — ABNORMAL LOW (ref 26.0–34.0)
MCH: 24.5 pg — AB (ref 26.0–34.0)
MCHC: 29.8 g/dL — ABNORMAL LOW (ref 30.0–36.0)
MCHC: 28.4 g/dL — AB (ref 30.0–36.0)
MCV: 85.3 fL (ref 78.0–100.0)
MCV: 86.6 fL (ref 78.0–100.0)
MONOS PCT: 1 %
Monocytes Absolute: 2.4 10*3/uL — ABNORMAL HIGH (ref 0.1–1.0)
Monocytes Absolute: 0.1 10*3/uL (ref 0.1–1.0)
Monocytes Relative: 14 %
NEUTROS ABS: 8.7 10*3/uL — AB (ref 1.7–7.7)
NEUTROS PCT: 95 %
Neutro Abs: 14.4 10*3/uL — ABNORMAL HIGH (ref 1.7–7.7)
Neutrophils Relative %: 82 %
PLATELETS: 219 10*3/uL (ref 150–400)
Platelets: 191 10*3/uL (ref 150–400)
RBC: 3.89 MIL/uL (ref 3.87–5.11)
RBC: 3.87 MIL/uL (ref 3.87–5.11)
RDW: 14.6 % (ref 11.5–15.5)
RDW: 14.5 % (ref 11.5–15.5)
WBC: 17.7 10*3/uL — AB (ref 4.0–10.5)
WBC: 9.2 10*3/uL (ref 4.0–10.5)

## 2015-11-16 LAB — BLOOD GAS, ARTERIAL
Acid-Base Excess: 7.5 mmol/L — ABNORMAL HIGH (ref 0.0–2.0)
Acid-Base Excess: 9.2 mmol/L — ABNORMAL HIGH (ref 0.0–2.0)
BICARBONATE: 33.4 meq/L — AB (ref 20.0–24.0)
Bicarbonate: 34.1 mEq/L — ABNORMAL HIGH (ref 20.0–24.0)
DRAWN BY: 441661
Delivery systems: POSITIVE
Delivery systems: POSITIVE
Drawn by: 275531
EXPIRATORY PAP: 8
Expiratory PAP: 8
FIO2: 0.5
FIO2: 0.5
Inspiratory PAP: 16
Inspiratory PAP: 16
O2 SAT: 94.4 %
O2 Saturation: 98.9 %
PATIENT TEMPERATURE: 98.6
PEEP: 8 cmH2O
PH ART: 7.323 — AB (ref 7.350–7.450)
PO2 ART: 80.9 mmHg (ref 80.0–100.0)
PO2 ART: 135 mmHg — AB (ref 80.0–100.0)
Patient temperature: 98.6
RATE: 15 resp/min
RATE: 15 resp/min
TCO2: 35.5 mmol/L (ref 0–100)
TCO2: 35.8 mmol/L (ref 0–100)
pCO2 arterial: 66.3 mmHg (ref 35.0–45.0)
pCO2 arterial: 55.4 mmHg — ABNORMAL HIGH (ref 35.0–45.0)
pH, Arterial: 7.406 (ref 7.350–7.450)

## 2015-11-16 LAB — COMPREHENSIVE METABOLIC PANEL
ALBUMIN: 2.9 g/dL — AB (ref 3.5–5.0)
ALK PHOS: 79 U/L (ref 38–126)
ALT: 11 U/L — AB (ref 14–54)
ALT: 15 U/L (ref 14–54)
ANION GAP: 10 (ref 5–15)
ANION GAP: 13 (ref 5–15)
AST: 17 U/L (ref 15–41)
AST: 21 U/L (ref 15–41)
Albumin: 3.2 g/dL — ABNORMAL LOW (ref 3.5–5.0)
Alkaline Phosphatase: 71 U/L (ref 38–126)
BILIRUBIN TOTAL: 1.2 mg/dL (ref 0.3–1.2)
BUN: 15 mg/dL (ref 6–20)
BUN: 14 mg/dL (ref 6–20)
CALCIUM: 8.8 mg/dL — AB (ref 8.9–10.3)
CHLORIDE: 91 mmol/L — AB (ref 101–111)
CO2: 38 mmol/L — ABNORMAL HIGH (ref 22–32)
CO2: 33 mmol/L — ABNORMAL HIGH (ref 22–32)
CREATININE: 1.27 mg/dL — AB (ref 0.44–1.00)
Calcium: 9.6 mg/dL (ref 8.9–10.3)
Chloride: 95 mmol/L — ABNORMAL LOW (ref 101–111)
Creatinine, Ser: 1.15 mg/dL — ABNORMAL HIGH (ref 0.44–1.00)
GFR, EST AFRICAN AMERICAN: 46 mL/min — AB (ref >60)
GFR, EST AFRICAN AMERICAN: 52 mL/min — AB (ref >60)
GFR, EST NON AFRICAN AMERICAN: 40 mL/min — AB (ref >60)
GFR, EST NON AFRICAN AMERICAN: 45 mL/min — AB (ref >60)
GLUCOSE: 151 mg/dL — AB (ref 65–99)
Glucose, Bld: 128 mg/dL — ABNORMAL HIGH (ref 65–99)
POTASSIUM: 3.7 mmol/L (ref 3.5–5.1)
Potassium: 3.3 mmol/L — ABNORMAL LOW (ref 3.5–5.1)
Sodium: 139 mmol/L (ref 135–145)
Sodium: 141 mmol/L (ref 135–145)
TOTAL PROTEIN: 6.7 g/dL (ref 6.5–8.1)
Total Bilirubin: 0.6 mg/dL (ref 0.3–1.2)
Total Protein: 7.1 g/dL (ref 6.5–8.1)

## 2015-11-16 LAB — URINALYSIS, ROUTINE W REFLEX MICROSCOPIC
BILIRUBIN URINE: NEGATIVE
GLUCOSE, UA: NEGATIVE mg/dL
Ketones, ur: NEGATIVE mg/dL
Nitrite: POSITIVE — AB
PROTEIN: 100 mg/dL — AB
Specific Gravity, Urine: 1.01 (ref 1.005–1.030)
pH: 7.5 (ref 5.0–8.0)

## 2015-11-16 LAB — PROTIME-INR
INR: 1.54 — ABNORMAL HIGH (ref 0.00–1.49)
PROTHROMBIN TIME: 18.6 s — AB (ref 11.6–15.2)

## 2015-11-16 LAB — PROCALCITONIN: Procalcitonin: 0.3 ng/mL

## 2015-11-16 LAB — URINE MICROSCOPIC-ADD ON

## 2015-11-16 LAB — I-STAT CG4 LACTIC ACID, ED: LACTIC ACID, VENOUS: 1.67 mmol/L (ref 0.5–2.0)

## 2015-11-16 LAB — APTT: aPTT: 29 seconds (ref 24–37)

## 2015-11-16 LAB — BRAIN NATRIURETIC PEPTIDE: B Natriuretic Peptide: 413.5 pg/mL — ABNORMAL HIGH (ref 0.0–100.0)

## 2015-11-16 LAB — MRSA PCR SCREENING: MRSA by PCR: NEGATIVE

## 2015-11-16 LAB — LACTIC ACID, PLASMA: Lactic Acid, Venous: 1.8 mmol/L (ref 0.5–2.0)

## 2015-11-16 MED ORDER — FUROSEMIDE 10 MG/ML IJ SOLN
60.0000 mg | INTRAMUSCULAR | Status: AC
  Administered 2015-11-16: 60 mg via INTRAVENOUS
  Filled 2015-11-16: qty 6

## 2015-11-16 MED ORDER — LEVOFLOXACIN IN D5W 750 MG/150ML IV SOLN
750.0000 mg | Freq: Once | INTRAVENOUS | Status: AC
  Administered 2015-11-16: 750 mg via INTRAVENOUS
  Filled 2015-11-16: qty 150

## 2015-11-16 MED ORDER — VANCOMYCIN HCL 10 G IV SOLR
2000.0000 mg | Freq: Once | INTRAVENOUS | Status: AC
  Administered 2015-11-16: 2000 mg via INTRAVENOUS
  Filled 2015-11-16: qty 2000

## 2015-11-16 MED ORDER — DEXTROSE 5 % IV SOLN
2.0000 g | Freq: Once | INTRAVENOUS | Status: AC
  Administered 2015-11-16: 2 g via INTRAVENOUS
  Filled 2015-11-16: qty 2

## 2015-11-16 MED ORDER — SERTRALINE HCL 100 MG PO TABS
100.0000 mg | ORAL_TABLET | Freq: Every day | ORAL | Status: DC
  Administered 2015-11-16 – 2015-11-23 (×8): 100 mg via ORAL
  Filled 2015-11-16 (×8): qty 1

## 2015-11-16 MED ORDER — OMEPRAZOLE MAGNESIUM 20 MG PO TBEC: 20.0000 mg | DELAYED_RELEASE_TABLET | Freq: Every evening | ORAL | Status: DC | PRN

## 2015-11-16 MED ORDER — TORSEMIDE 20 MG PO TABS
40.0000 mg | ORAL_TABLET | Freq: Two times a day (BID) | ORAL | Status: DC
  Filled 2015-11-16: qty 2

## 2015-11-16 MED ORDER — DEXTROSE 5 % IV SOLN
2.0000 g | Freq: Three times a day (TID) | INTRAVENOUS | Status: DC
  Filled 2015-11-16 (×2): qty 2

## 2015-11-16 MED ORDER — METOPROLOL TARTRATE 50 MG PO TABS: 50.0000 mg | ORAL_TABLET | Freq: Two times a day (BID) | ORAL | Status: DC

## 2015-11-16 MED ORDER — SODIUM CHLORIDE 0.9 % IV BOLUS (SEPSIS): 500.0000 mL | Freq: Once | INTRAVENOUS | Status: DC

## 2015-11-16 MED ORDER — FUROSEMIDE 10 MG/ML IJ SOLN: 60.0000 mg | Freq: Once | INTRAMUSCULAR | Status: DC

## 2015-11-16 MED ORDER — TORSEMIDE 20 MG PO TABS
40.0000 mg | ORAL_TABLET | Freq: Two times a day (BID) | ORAL | Status: DC
  Administered 2015-11-17: 40 mg via ORAL
  Filled 2015-11-16: qty 2

## 2015-11-16 MED ORDER — SODIUM CHLORIDE 0.9 % IV BOLUS (SEPSIS)
30.0000 mL/kg | Freq: Once | INTRAVENOUS | Status: AC
  Administered 2015-11-16: 2490 mL via INTRAVENOUS

## 2015-11-16 MED ORDER — POTASSIUM CHLORIDE CRYS ER 20 MEQ PO TBCR
40.0000 meq | EXTENDED_RELEASE_TABLET | Freq: Two times a day (BID) | ORAL | Status: DC
  Administered 2015-11-16 – 2015-11-24 (×16): 40 meq via ORAL
  Filled 2015-11-16 (×16): qty 2

## 2015-11-16 MED ORDER — METOPROLOL TARTRATE 50 MG PO TABS
50.0000 mg | ORAL_TABLET | Freq: Two times a day (BID) | ORAL | Status: DC
  Administered 2015-11-17 – 2015-11-24 (×15): 50 mg via ORAL
  Filled 2015-11-16 (×15): qty 1

## 2015-11-16 MED ORDER — PANTOPRAZOLE SODIUM 40 MG PO TBEC
40.0000 mg | DELAYED_RELEASE_TABLET | Freq: Every day | ORAL | Status: DC | PRN
  Administered 2015-11-21 – 2015-11-23 (×2): 40 mg via ORAL
  Filled 2015-11-16 (×2): qty 1

## 2015-11-16 MED ORDER — SIMVASTATIN 40 MG PO TABS
40.0000 mg | ORAL_TABLET | Freq: Every day | ORAL | Status: DC
  Administered 2015-11-16 – 2015-11-23 (×8): 40 mg via ORAL
  Filled 2015-11-16 (×8): qty 1

## 2015-11-16 MED ORDER — VANCOMYCIN HCL IN DEXTROSE 1-5 GM/200ML-% IV SOLN
1000.0000 mg | Freq: Once | INTRAVENOUS | Status: DC
  Filled 2015-11-16: qty 200

## 2015-11-16 MED ORDER — RIVAROXABAN 20 MG PO TABS
20.0000 mg | ORAL_TABLET | Freq: Every day | ORAL | Status: DC
  Administered 2015-11-16 – 2015-11-23 (×8): 20 mg via ORAL
  Filled 2015-11-16 (×8): qty 1

## 2015-11-16 MED ORDER — DEXTROSE 5 % IV SOLN
1.0000 g | INTRAVENOUS | Status: DC
  Filled 2015-11-16: qty 10

## 2015-11-16 MED ORDER — SPIRONOLACTONE 25 MG PO TABS
25.0000 mg | ORAL_TABLET | Freq: Every day | ORAL | Status: DC
  Administered 2015-11-17: 25 mg via ORAL
  Filled 2015-11-16: qty 1

## 2015-11-16 MED ORDER — GABAPENTIN 300 MG PO CAPS
300.0000 mg | ORAL_CAPSULE | Freq: Two times a day (BID) | ORAL | Status: DC
  Administered 2015-11-16 – 2015-11-24 (×16): 300 mg via ORAL
  Filled 2015-11-16 (×16): qty 1

## 2015-11-16 MED ORDER — VANCOMYCIN HCL 10 G IV SOLR: 1250.0000 mg | INTRAVENOUS | Status: DC

## 2015-11-16 MED ORDER — SODIUM CHLORIDE 0.9 % IV BOLUS (SEPSIS): 1000.0000 mL | INTRAVENOUS | Status: DC

## 2015-11-16 MED ORDER — ACETAMINOPHEN 325 MG PO TABS
650.0000 mg | ORAL_TABLET | ORAL | Status: DC | PRN
  Administered 2015-11-17 – 2015-11-23 (×5): 650 mg via ORAL
  Filled 2015-11-16 (×5): qty 2

## 2015-11-16 MED ORDER — LEVOFLOXACIN IN D5W 750 MG/150ML IV SOLN: 750.0000 mg | INTRAVENOUS | Status: DC

## 2015-11-16 NOTE — ED Notes (Signed)
MD at bedside. 

## 2015-11-16 NOTE — ED Notes (Signed)
Report attempted nurse to call back.  

## 2015-11-16 NOTE — ED Notes (Signed)
Pt given 1,000mg  of Tylenol before transport to ED.

## 2015-11-16 NOTE — ED Notes (Signed)
Pt has only had 1L of NS at this time. MD merrell paged to make aware of pulmonary congestion on chest xray. Fluids stopped at this time.

## 2015-11-16 NOTE — Progress Notes (Addendum)
Pharmacy Consult for Rocephin for UTI. She has already received aztreonam 2 gm, levaquin 750 mg and vancomycin 2 gm PCN allergy listed in EPIC but Augmentin course completed 04/27/2014. Plan: rocephin 1 gm q24 Pharmacy to sign off Thanks Herby Abraham, Pharm.D. [phone] 11/16/2015 5:23 PM

## 2015-11-16 NOTE — ED Provider Notes (Signed)
CSN: [id number]     Arrival date & time 11/16/15  1244 History   First MD Initiated Contact with Patient 11/16/15 1306     Chief Complaint  Patient presents with  . Weakness  . Hypotension     (Consider location/radiation/quality/duration/timing/severity/associated sxs/prior Treatment) HPI Patient has complex medical history with significant comorbid illnesses. Last hospitalized in mid March with congestive heart failure, hypokalemia and COPD. Patient has been having general fatigue and weakness for 3 days. She did not report other localizing pain symptoms. Patient has developed a fever as of today. On EMS arrival patient was hypotensive at 79/30. She was given a 300 mL fluid bolus by EMS. At baseline the patient wears 2 L of home oxygen for COPD. Past Medical History  Diagnosis Date  . COPD (chronic obstructive pulmonary disease) (HCC)   . Pulmonary HTN (HCC)     PASP 50-45mmHg by [id number]  . History of rheumatic heart disease     X 3-LAST TIME WHEN PATIENT WAS 75 YRS OLD  . Sleep apnea     intolerant to CPAP  . Hypertension   . Bursitis, shoulder     bilateral  . GERD (gastroesophageal reflux disease)   . H/O hiatal hernia   . Anxiety   . Depression   . Varicose veins   . PUD (peptic ulcer disease)   . 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 DJD (degenerative joint disease)   . OA (osteoarthritis)   . Hyperlipidemia   . Urinary incontinence   . Pulmonary HTN (HCC)   . Thyroid nodule   . CHF (congestive heart failure) (HCC)     chronic diasotlic CHF  . Complication of anesthesia     "have trouble getting me awake sometimes; been ok latelhy" (09/24/2015)  . On home oxygen therapy     "2L; 24/7" (09/24/2015)   Past Surgical History  Procedure Laterality Date  . Bilateral salpingoophorectomy    . Vaginal delivery  x4  . Thyroidectomy, partial  2010    Dr Michaell Cowing  . Tubal ligation    . Total abdominal hysterectomy  1990    1990  . Coronary artery bypass graft   06/21/2000    x4  . Cardiac catheterization  02/11/2008, 01/30/2004, 06/14/2000  . Incontinence surgery      "tacked"  . Cardiac catheterization N/A 09/24/2015    Procedure: Right Heart Cath;  Surgeon: Laurey Morale, MD;  Location: [facility] INVASIVE CV LAB;  Service: Cardiovascular;  Laterality: N/A;  . Tee without cardioversion N/A 09/24/2015    Procedure: TRANSESOPHAGEAL ECHOCARDIOGRAM (TEE);  Surgeon: Laurey Morale, MD;  Location[facility] ENDOSCOPY;  Service: Cardiovascular;  Laterality: N/A;   Family History  Problem Relation Age of Onset  . Anemia Mother   . Emphysema Father   . Esophageal cancer Daughter    Social History  Substance Use Topics  . Smoking status: Former Smoker -- 1.00 packs/day for 47 years    Types: Cigarettes    Quit date: 07/26/2007  . Smokeless tobacco: Never Used  . Alcohol Use: No   OB History    No data available     Review of Systems  10 Systems reviewed and are negative for acute change except as noted in the HPI.  Allergies  Prednisone; Xopenex; Cefpodoxime; Clindamycin; Levalbuterol hcl; Lipitor; Other; Penicillins; Tape; Ventolin; Codeine; Hydrocodone-acetaminophen; and Latex  Home Medications   Prior to Admission medications   Medication Sig Start Date End Date Taking? Authorizing  Provider  acetaminophen (TYLENOL) 500 MG tablet Take 1,000 mg by mouth daily as needed for headache.    Historical Provider, MD  alendronate (FOSAMAX) 70 MG tablet Take 70 mg by mouth once a week. Reported on 10/27/2015 09/18/15   Historical Provider, MD  azelastine (ASTELIN) 0.1 % nasal spray Place 1 spray into both nostrils 2 (two) times daily. Use in each nostril as directed    Historical Provider, MD  calcium-vitamin D (OSCAL WITH D) 500-200 MG-UNIT per tablet Take 2 tablets by mouth daily with breakfast.    Historical Provider, MD  gabapentin (NEURONTIN) 300 MG capsule Take 300 mg by mouth 2 (two) times daily. 07/28/15   Historical Provider, MD  loratadine (CLARITIN) 10 MG  tablet Take 10 mg by mouth daily.    Historical Provider, MD  metoprolol (LOPRESSOR) 50 MG tablet Take 1 tablet by mouth two  times daily 10/13/15   Quintella Reichert, MD  nitroGLYCERIN (NITROSTAT) 0.4 MG SL tablet Place 1 tablet (0.4 mg total) under the tongue every 5 (five) minutes as needed for chest pain. Patient not taking: Reported on 10/27/2015 12/23/13   Quintella Reichert, MD  omeprazole (PRILOSEC OTC) 20 MG tablet Take 20 mg by mouth at bedtime as needed (GERD).     Historical Provider, MD  OXYGEN Inhale 2 L into the lungs continuous.    Historical Provider, MD  potassium chloride SA (K-DUR,KLOR-CON) 20 MEQ tablet Take 2 tablets (40 mEq total) by mouth 2 (two) times daily. 10/12/15   Laurey Morale, MD  rivaroxaban (XARELTO) 20 MG TABS tablet Take 1 tablet (20 mg total) by mouth at bedtime. 02/16/15   Quintella Reichert, MD  sertraline (ZOLOFT) 100 MG tablet Take 100 mg by mouth at bedtime.     Historical Provider, MD  simvastatin (ZOCOR) 40 MG tablet Take 1 tablet by mouth  every evening 08/10/15   Quintella Reichert, MD  spironolactone (ALDACTONE) 25 MG tablet Take 1 tablet (25 mg total) by mouth daily. 09/30/15   Graciella Freer, PA-C  torsemide (DEMADEX) 20 MG tablet Take 2 tablets (40 mg total) by mouth 2 (two) times daily. 10/19/15   Graciella Freer, PA-C   BP 129/54 mmHg  Pulse 73  Temp(Src) 101.1 F (38.4 C) (Rectal)  Resp 27  Ht 5\' 5"  (1.651 m)  Wt 183 lb (83.008 kg)  BMI 30.45 kg/m2  SpO2 95% Physical Exam  Constitutional: She is oriented to person, place, and time. She appears well-developed and well-nourished.  HENT:  Head: Normocephalic and atraumatic.  Eyes: EOM are normal. Pupils are equal, round, and reactive to light.  Neck: Neck supple.  Cardiovascular: Normal rate, regular rhythm, normal heart sounds and intact distal pulses.   Pulmonary/Chest: Effort normal and breath sounds normal.  Abdominal: Soft. Bowel sounds are normal. She exhibits no distension. There is no  tenderness.  Musculoskeletal: Normal range of motion. She exhibits no edema.  Neurological: She is alert and oriented to person, place, and time. She has normal strength. Coordination normal. GCS eye subscore is 4. GCS verbal subscore is 5. GCS motor subscore is 6.  Skin: Skin is warm, dry and intact.  Psychiatric: She has a normal mood and affect.    ED Course  Procedures (including critical care time) CRITICAL CARE Performed by: Arby Barrette   Total critical care time: 30 minutes  Critical care time was exclusive of separately billable procedures and treating other patients.  Critical care was necessary to treat or  prevent imminent or life-threatening deterioration.  Critical care was time spent personally by me on the following activities: development of treatment plan with patient and/or surrogate as well as nursing, discussions with consultants, evaluation of patient's response to treatment, examination of patient, obtaining history from patient or surrogate, ordering and performing treatments and interventions, ordering and review of laboratory studies, ordering and review of radiographic studies, pulse oximetry and re-evaluation of patient's condition. Labs Review Labs Reviewed  COMPREHENSIVE METABOLIC PANEL - Abnormal; Notable for the following:    Chloride 91 (*)    CO2 38 (*)    Glucose, Bld 128 (*)    Creatinine, Ser 1.27 (*)    Albumin 3.2 (*)    ALT 11 (*)    GFR calc non Af Amer 40 (*)    GFR calc Af Amer 46 (*)    All other components within normal limits  CBC WITH DIFFERENTIAL/PLATELET - Abnormal; Notable for the following:    WBC 17.7 (*)    Hemoglobin 9.9 (*)    HCT 33.2 (*)    MCH 25.4 (*)    MCHC 29.8 (*)    Neutro Abs 14.4 (*)    Monocytes Absolute 2.4 (*)    All other components within normal limits  CULTURE, BLOOD (ROUTINE X 2)  CULTURE, BLOOD (ROUTINE X 2)  URINE CULTURE  URINALYSIS, ROUTINE W REFLEX MICROSCOPIC (NOT AT [facility])  I-STAT CG4 LACTIC  ACID, ED    Imaging Review No results found. I have personally reviewed and evaluated these images and lab results as part of my medical decision-making.   EKG Interpretation None      MDM   Final diagnoses:  Sepsis, due to unspecified organism [facility])   Patient present with hypotension, fever and leukocytosis. Findings consistent with sepsis. Sepsis protocol initiated. Admission admission reviewed with [facility] hospitalist.    Arby Barrette, MD 11/25/15 [phone]

## 2015-11-16 NOTE — ED Notes (Signed)
Pt from home transported by ems. Pt states since Friday she has been feeling fatigue and generally weak all over. Today she started having a fever and on ems arrival pt was hypotensive at 79/30. Pt was given bolus of 300 cc of NS and on arrival to ed bp 108/46. Pt is alert and ox4. Pt denies any pain. Pt wear 2 L of oxygen at home.

## 2015-11-16 NOTE — Telephone Encounter (Signed)
Spoke w/Amy, she is still in pt's home, she states pt was feeling a little weak over the weekend but was still able to do normal activities but today pt is completely weak, no energy, seems to be pretty lethargic, she has bumped oxygen to 3 L and sats are up to 95%.  She states pt is so weak and lethargic she can't even hold her head up.  Advised pt should go to ER for eval, she is agreeable, pt's son and husband are there and will call EMS to bring pt in.

## 2015-11-16 NOTE — Telephone Encounter (Signed)
[facility] with [facility] called pt Temp 101/.8, she is on  02 2 liters 88 %, weak , tired crackles in lungs, chills, please advise

## 2015-11-16 NOTE — Progress Notes (Signed)
ABG collected. Results are in Epic.

## 2015-11-16 NOTE — H&P (Signed)
History and Physical    [name] ZOX:[id number] DOB: 01/08/1941 DOA: 11/16/2015  Referring MD/NP/PA: EDP PCP: Astrid Divine, MD  Outpatient Specialists: Patient Care Team: Maurice Small, MD as PCP - General (Family Medicine) Patient coming from:  Home   Chief Complaint: Fever and Fatigue  HPI: [name] is a 75 y.o. 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 She denies any cough or hemoptysis. Denies fevers, chills, night sweats or mucositis. Reports fevers up to 101.1, chills and night Sweats, intermittent since last evening. Denies any chest pain, chest wall pain or palpitations. Denies any abdominal pain. Denies appetite changes.  She endorses some nausea without vomiting, diarrhea, dizziness or vertigo. Denies lower extremity swelling. No confusion was reported. Denies any vision changes,double vision or headaches.Reports sick contacts in her nephew who had been recently hospitalized for PNA. Denies any recent long distant travels.   ED Course:  BP 115/53 mmHg  Pulse 75  Temp(Src) 98.2 F (36.8 C) (Oral)  Resp 26  Ht 5\' 5"  (1.651 m)  Wt 84.7 kg (186 lb 11.7 oz)  BMI 31.07 kg/m2  SpO2 97% WBC 17.7. BNP 2.45 LActic acid 1.6  Because of PCN allergy, Vanc, Aztreonam and Levo were instructed by Pharmacy. However, UA positive for Nitrites, suggestive of pyelonephritis.Cultures are pending. She will be admitted to SDU for further management  Review of Systems: As per HPI otherwise 10 point review of systems negative.   Past Medical History  Diagnosis Date  . COPD (chronic obstructive pulmonary disease) (HCC)   . Pulmonary HTN  (HCC)     PASP 50-63mmHg by [id number]  . History of rheumatic heart disease     X 3-LAST TIME WHEN PATIENT WAS 75 YRS OLD  . Sleep apnea     intolerant to CPAP  . Hypertension   . Bursitis, shoulder     bilateral  . GERD (gastroesophageal reflux disease)   . H/O hiatal hernia   . Anxiety   . Depression   . Varicose veins   . PUD (peptic ulcer disease)   . 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 DJD (degenerative joint disease)   . OA (osteoarthritis)   . Hyperlipidemia   . Urinary incontinence   . Pulmonary HTN (HCC)   . Thyroid nodule   . CHF (congestive heart failure) (HCC)     chronic diasotlic CHF  . Complication of anesthesia     "have trouble getting me awake sometimes; been ok latelhy" (09/24/2015)  . On home oxygen therapy     "2L; 24/7" (09/24/2015)    Past Surgical History  Procedure Laterality Date  . Bilateral salpingoophorectomy    . Vaginal delivery  x4  . Thyroidectomy, partial  2010    Dr Michaell Cowing  . Tubal ligation    . Total abdominal hysterectomy  1990    1990  . Coronary artery bypass graft  06/21/2000    x4  . Cardiac catheterization  02/11/2008, 01/30/2004, 06/14/2000  . Incontinence surgery      "tacked"  . Cardiac catheterization N/A 09/24/2015    Procedure: Right Heart Cath;  Surgeon: Laurey Morale, MD;  Location: [facility] INVASIVE CV LAB;  Service: Cardiovascular;  Laterality: N/A;  . Tee without cardioversion N/A 09/24/2015    Procedure: TRANSESOPHAGEAL ECHOCARDIOGRAM (TEE);  Surgeon: Laurey Morale, MD;  Location: [facility] ENDOSCOPY;  Service: Cardiovascular;  Laterality: N/A;     reports that she quit smoking about 8 years ago. Her smoking use included Cigarettes. She has a 47 pack-year smoking history. She has never used smokeless tobacco. She reports that she does not drink alcohol or use illicit drugs.  Allergies  Allergen Reactions  . Prednisone Rash    jittery  . Tape Other (See Comments)    Bleeding  . Xopenex [Levalbuterol] Other (See  Comments)    Made mouth and throat sore  . Cefpodoxime Other (See Comments)    Yeast infections   . Codeine Other (See Comments)    Anxious/ jittery  . Lipitor [Atorvastatin] Other (See Comments)    Made joint start hurting/ Muscle aches  . Other Swelling    Venholin HFA  . Penicillins Other (See Comments)    Has patient had a PCN reaction causing immediate rash, facial/tongue/throat swelling, SOB or lightheadedness with hypotension: unknown, childhood reaction Has patient had a PCN reaction causing severe rash involving mucus membranes or skin necrosis: No Has patient had a PCN reaction that required hospitalization No Has patient had a PCN reaction occurring within the last 10 years: No If all of the above answers are "NO", then may proceed with Cephalosporin use.   Terald Sleeper [Kdc:Albuterol] Other (See Comments)    "jittery"  . Clindamycin Other (See Comments)    Unknown   . Hydrocodone-Acetaminophen Anxiety  . Latex Itching and Rash  . Levalbuterol Hcl Other (See Comments)    Unknown     Family History  Problem Relation Age of Onset  . Anemia Mother   . Emphysema Father   . Esophageal cancer Daughter     Family history reviewed and not pertinent (If you reviewed it)  Prior to Admission medications   Medication Sig Start Date End Date Taking? Authorizing Provider  acetaminophen (TYLENOL) 500 MG tablet Take 1,000 mg by mouth daily as needed for headache.    Historical Provider, MD  alendronate (FOSAMAX) 70 MG tablet Take 70 mg by mouth once a week. Reported on 10/27/2015 09/18/15   Historical Provider, MD  azelastine (ASTELIN) 0.1 % nasal spray Place 1 spray into both nostrils 2 (two) times daily. Use in each nostril as directed    Historical Provider, MD  calcium-vitamin D (OSCAL WITH D) 500-200 MG-UNIT per tablet Take 2 tablets by mouth daily with breakfast.    Historical Provider, MD  gabapentin (NEURONTIN) 300 MG capsule Take 300 mg by mouth 2 (two) times daily. 07/28/15    Historical Provider, MD  loratadine (CLARITIN) 10 MG tablet Take 10 mg by mouth daily.    Historical Provider, MD  metoprolol (LOPRESSOR) 50 MG tablet Take 1 tablet by mouth two  times daily 10/13/15   Quintella Reichert, MD  nitroGLYCERIN (NITROSTAT) 0.4 MG SL tablet Place 1 tablet (0.4 mg total) under the tongue every 5 (five) minutes as needed for chest pain. Patient not taking: Reported on 10/27/2015 12/23/13   Quintella Reichert, MD  omeprazole (PRILOSEC OTC) 20 MG tablet Take 20 mg by mouth at bedtime as needed (GERD).     Historical Provider, MD  OXYGEN Inhale 2 L into the lungs continuous.    Historical Provider, MD  potassium chloride SA (K-DUR,KLOR-CON) 20 MEQ tablet Take 2  tablets (40 mEq total) by mouth 2 (two) times daily. 10/12/15   Laurey Morale, MD  rivaroxaban (XARELTO) 20 MG TABS tablet Take 1 tablet (20 mg total) by mouth at bedtime. 02/16/15   Quintella Reichert, MD  sertraline (ZOLOFT) 100 MG tablet Take 100 mg by mouth at bedtime.     Historical Provider, MD  simvastatin (ZOCOR) 40 MG tablet Take 1 tablet by mouth  every evening 08/10/15   Quintella Reichert, MD  spironolactone (ALDACTONE) 25 MG tablet Take 1 tablet (25 mg total) by mouth daily. 09/30/15   Graciella Freer, PA-C  torsemide (DEMADEX) 20 MG tablet Take 2 tablets (40 mg total) by mouth 2 (two) times daily. 10/19/15   Graciella Freer, PA-C    Physical Exam:    Filed Vitals:   11/16/15 1515 11/16/15 1536 11/16/15 1545 11/16/15 1617  BP: 88/55 106/39 117/64 115/53  Pulse: 72 81 74 75  Temp:    98.2 F (36.8 C)  TempSrc:    Oral  Resp: 26 25 25 26   Height:    5\' 5"  (1.651 m)  Weight:    84.7 kg (186 lb 11.7 oz)  SpO2: 97% 95% 100% 97%      Constitutional: NAD, calm, comfortable Filed Vitals:   11/16/15 1515 11/16/15 1536 11/16/15 1545 11/16/15 1617  BP: 88/55 106/39 117/64 115/53  Pulse: 72 81 74 75  Temp:    98.2 F (36.8 C)  TempSrc:    Oral  Resp: 26 25 25 26   Height:    5\' 5"  (1.651 m)  Weight:     84.7 kg (186 lb 11.7 oz)  SpO2: 97% 95% 100% 97%   Eyes: PERRL, lids and conjunctivae normal ENMT: Mucous membranes are moist. Posterior pharynx clear of any exudate or lesions.Normal dentition.  Neck: normal, supple, no masses, no thyromegaly Respiratory: clear to auscultation bilaterally, no wheezing, no crackles. Normal respiratory effort. No accessory muscle use.  Cardiovascular: Regular rate and rhythm, no murmurs / rubs / gallops. No extremity edema. 2+ pedal pulses. No carotid bruits.  Abdomen: no tenderness, no masses palpated. No hepatosplenomegaly. Bowel sounds positive.  Musculoskeletal: no clubbing / cyanosis. No joint deformity upper and lower extremities. Good ROM, no contractures. Normal muscle tone.  Skin: no rashes, lesions, ulcers. No induration Neurologic: CN 2-12 grossly intact. Sensation intact, DTR normal. Strength 5/5 in all 4.  Psychiatric: Normal judgment and insight. Alert and oriented x 3. Normal mood.     Labs on Admission: I have personally reviewed following labs and imaging studies  CBC:  Recent Labs Lab 11/16/15 1303  WBC 17.7*  NEUTROABS 14.4*  HGB 9.9*  HCT 33.2*  MCV 85.3  PLT 219    Basic Metabolic Panel:  Recent Labs Lab 11/16/15 1303  NA 139  K 3.7  CL 91*  CO2 38*  GLUCOSE 128*  BUN 15  CREATININE 1.27*  CALCIUM 9.6    GFR: Estimated Creatinine Clearance: 40.5 mL/min (by C-G formula based on Cr of 1.27).  Liver Function Tests:  Recent Labs Lab 11/16/15 1303  AST 17  ALT 11*  ALKPHOS 71  BILITOT 0.6  PROT 7.1  ALBUMIN 3.2*    BNP (last 3 results)  Recent Labs  01/09/15 0907  PROBNP 245.0*    Urine analysis:    Component Value Date/Time   COLORURINE YELLOW 11/16/2015 1411   APPEARANCEUR CLOUDY* 11/16/2015 1411   LABSPEC 1.010 11/16/2015 1411   PHURINE 7.5 11/16/2015 1411   GLUCOSEU  NEGATIVE 11/16/2015 1411   HGBUR MODERATE* 11/16/2015 1411   BILIRUBINUR NEGATIVE 11/16/2015 1411   KETONESUR NEGATIVE  11/16/2015 1411   PROTEINUR 100* 11/16/2015 1411   NITRITE POSITIVE* 11/16/2015 1411   LEUKOCYTESUR MODERATE* 11/16/2015 1411    Sepsis Labs: @LABRCNTIP (procalcitonin:4,lacticidven:4) )No results found for this or any previous visit (from the past 240 hour(s)).   Radiological Exams on Admission: Dg Chest Portable 1 View  11/16/2015  CLINICAL DATA:  Shortness of breath.  Fatigue, generalized weakness. EXAM: PORTABLE CHEST 1 VIEW COMPARISON:  09/24/2015 FINDINGS: Prior CABG. Cardiomegaly with vascular congestion. Scarring or atelectasis at the left base. Right lung is clear. No effusions. No acute bony abnormality. IMPRESSION: Cardiomegaly with vascular congestion. Left basilar scarring or atelectasis. Electronically Signed   By: Charlett Nose M.D.   On: 11/16/2015 15:18    EKG: Independently reviewed.  Assessment/Plan Principal Problem:   Sepsis (HCC) Active Problems:   Pulmonary hypertension (HCC)   DIASTOLIC DYSFUNCTION   COPD mixed type (HCC)   Obstructive sleep apnea   Chronic diastolic heart failure (HCC)   Chronic anticoagulation   HTN (hypertension)   Dyslipidemia, goal LDL below 70   AKI (acute kidney injury) (HCC)    UTI suggested by  -  F/u urine culture -  Ceftriaxone Urinalysis    Component Value Date/Time   COLORURINE YELLOW 11/16/2015 1411   APPEARANCEUR CLOUDY* 11/16/2015 1411   LABSPEC 1.010 11/16/2015 1411   PHURINE 7.5 11/16/2015 1411   GLUCOSEU NEGATIVE 11/16/2015 1411   HGBUR MODERATE* 11/16/2015 1411   BILIRUBINUR NEGATIVE 11/16/2015 1411   KETONESUR NEGATIVE 11/16/2015 1411   PROTEINUR 100* 11/16/2015 1411   NITRITE POSITIVE* 11/16/2015 1411   LEUKOCYTESUR MODERATE* 11/16/2015 1411     Presumed SIRS of Unclear Etiology  Rule out  Urine sourc/ Pyelonephrosi  Chest x-ray with Cardiomegaly with vascular congestion, left basilar scarring. T max 101.1, Sat O2 at this time 95 in 3l. Lactic acid1.6, WBC 17.7   BP is 115/53, P75, at her baseline.  UA +  nitrities  Admit to SDU  Sepsis order set  IV antibiotics by pharmacy with Rocephin Follow lactic acid Follow blood and urine cultures IV fluids at 100 cc/h.  Procalcitonin   Leukocytosis, likely related to underlying infection as above. WBC 17. Fever up to 101 F Cultures pending -  abx as above -  Repeat WBC in AM  Hypertension BP 115/53 mmHg  Pulse 75  Temp(Src) 98.2 F (36.8 C) (Oral)  Resp 26  Ht 5\' 5"  (1.651 m)  Wt 84.7 kg (186 lb 11.7 oz)  BMI 31.07 kg/m2  SpO2 97% Continue home anti-hypertensive medications.   Hyperlipidemia Continue home statins  Diastolic CHF: No acute decompensation. 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 Mild AI and AS. 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 A small PFO with a weakly positive bubble study, Moderate TR Continue Xarelto  Acute Kidney Injury,infection and dehydration Current Cr 1.27, BL 0.8. She was placed on Vanco, Aztreonam and Levo Hold NSAIDs IVF. Strict I/O and daily weights  Follow Cr in  am.  Check blood cultures, urine culture  CXR to work up for leukocytosis. Renally dose medications.   Deconditioning Consult PT/OT  DVT prophylaxis: Xarelto Code Status:   Full  Family Communication:  Discussed with daughter Disposition Plan: Expect patient to be discharged to home Consults called:    None Admission status: SDU   [facility] EPA-C [facility]   If 7PM-7AM, please contact night-coverage [email or web address] Password [facility]  11/16/2015, 4:53 PM

## 2015-11-16 NOTE — Progress Notes (Signed)
Pharmacy Antibiotic Note  LUREE WINSLOW is a 75 y.o. female admitted on 11/16/2015 with sepsis.  Pharmacy has been consulted for vanc/aztreonam/levo dosing. AF, wbc 17.7. SCr 1.27 on admit, CrCl~40.  Plan: Vanc 2g IV x1; then 1250mg  IV q24h Aztreonam 2g IV x1; then 2g IV q8h Levo 750mg  IV x1; then 750mg  IV q48h Monitor clinical progress, c/s, renal function, abx plan/LOT VT@SS  as indicated   Height: 5\' 5"  (165.1 cm) Weight: 183 lb (83.008 kg) IBW/kg (Calculated) : 57  Temp (24hrs), Avg:99.7 F (37.6 C), Min:99.7 F (37.6 C), Max:99.7 F (37.6 C)   Recent Labs Lab 11/16/15 1303 11/16/15 1325  WBC 17.7*  --   CREATININE 1.27*  --   LATICACIDVEN  --  1.67    Estimated Creatinine Clearance: 40.1 mL/min (by C-G formula based on Cr of 1.27).    Allergies  Allergen Reactions  . Prednisone Rash    jittery  . Xopenex [Levalbuterol]     Made mouth and throat sore  . Cefpodoxime Other (See Comments)    Yeast infections   . Clindamycin Other (See Comments)    Unknown   . Levalbuterol Hcl Other (See Comments)    Unknown   . Lipitor [Atorvastatin]     Other reaction(s): Other (See Comments) Made joint start hurting  Muscle aches  . Other Swelling    Venholin HFA  . Penicillins Other (See Comments)    Has patient had a PCN reaction causing immediate rash, facial/tongue/throat swelling, SOB or lightheadedness with hypotension: unknown, childhood reaction Has patient had a PCN reaction causing severe rash involving mucus membranes or skin necrosis: No Has patient had a PCN reaction that required hospitalization No Has patient had a PCN reaction occurring within the last 10 years: No If all of the above answers are "NO", then may proceed with Cephalosporin use.   . Tape Other (See Comments)    Bleeding  . Ventolin [Kdc:Albuterol]     "jittery"  . Codeine Other (See Comments)    anxious jittery  . Hydrocodone-Acetaminophen Anxiety  . Latex Itching and Rash     Antimicrobials this admission: 4/24 vanc >>  4/24 aztreonam >>  4/24 levo >>  Dose adjustments this admission: n/a  Microbiology results: 4/24 BCx:  4/24 UCx:     Babs Bertin, PharmD, BCPS Clinical Pharmacist Pager [phone] 11/16/2015 2:00 PM

## 2015-11-17 ENCOUNTER — Inpatient Hospital Stay (HOSPITAL_COMMUNITY): Payer: Medicare Other

## 2015-11-17 DIAGNOSIS — I5032 Chronic diastolic (congestive) heart failure: Secondary | ICD-10-CM

## 2015-11-17 DIAGNOSIS — I482 Chronic atrial fibrillation, unspecified: Secondary | ICD-10-CM | POA: Diagnosis present

## 2015-11-17 DIAGNOSIS — A419 Sepsis, unspecified organism: Secondary | ICD-10-CM

## 2015-11-17 DIAGNOSIS — R7881 Bacteremia: Secondary | ICD-10-CM | POA: Diagnosis present

## 2015-11-17 DIAGNOSIS — I330 Acute and subacute infective endocarditis: Secondary | ICD-10-CM

## 2015-11-17 DIAGNOSIS — N179 Acute kidney failure, unspecified: Secondary | ICD-10-CM

## 2015-11-17 DIAGNOSIS — I426 Alcoholic cardiomyopathy: Secondary | ICD-10-CM | POA: Diagnosis present

## 2015-11-17 DIAGNOSIS — I5033 Acute on chronic diastolic (congestive) heart failure: Secondary | ICD-10-CM | POA: Diagnosis present

## 2015-11-17 DIAGNOSIS — N10 Acute tubulo-interstitial nephritis: Secondary | ICD-10-CM

## 2015-11-17 DIAGNOSIS — N390 Urinary tract infection, site not specified: Secondary | ICD-10-CM

## 2015-11-17 LAB — CBC WITH DIFFERENTIAL/PLATELET
BASOS ABS: 0.1 10*3/uL (ref 0.0–0.1)
Basophils Relative: 0 %
Eosinophils Absolute: 0 10*3/uL (ref 0.0–0.7)
Eosinophils Relative: 0 %
HCT: 29.4 % — ABNORMAL LOW (ref 36.0–46.0)
HEMOGLOBIN: 8.4 g/dL — AB (ref 12.0–15.0)
LYMPHS ABS: 1.8 10*3/uL (ref 0.7–4.0)
LYMPHS PCT: 8 %
MCH: 24.6 pg — AB (ref 26.0–34.0)
MCHC: 28.6 g/dL — ABNORMAL LOW (ref 30.0–36.0)
MCV: 86 fL (ref 78.0–100.0)
Monocytes Absolute: 1.9 10*3/uL — ABNORMAL HIGH (ref 0.1–1.0)
Monocytes Relative: 8 %
NEUTROS ABS: 19.7 10*3/uL — AB (ref 1.7–7.7)
NEUTROS PCT: 84 %
Platelets: 178 10*3/uL (ref 150–400)
RBC: 3.42 MIL/uL — AB (ref 3.87–5.11)
RDW: 14.6 % (ref 11.5–15.5)
WBC: 23.5 10*3/uL — AB (ref 4.0–10.5)

## 2015-11-17 LAB — BASIC METABOLIC PANEL
ANION GAP: 12 (ref 5–15)
BUN: 17 mg/dL (ref 6–20)
CHLORIDE: 96 mmol/L — AB (ref 101–111)
CO2: 32 mmol/L (ref 22–32)
Calcium: 8.7 mg/dL — ABNORMAL LOW (ref 8.9–10.3)
Creatinine, Ser: 1.37 mg/dL — ABNORMAL HIGH (ref 0.44–1.00)
GFR calc Af Amer: 42 mL/min — ABNORMAL LOW (ref >60)
GFR calc non Af Amer: 36 mL/min — ABNORMAL LOW (ref >60)
Glucose, Bld: 136 mg/dL — ABNORMAL HIGH (ref 65–99)
POTASSIUM: 3.2 mmol/L — AB (ref 3.5–5.1)
SODIUM: 140 mmol/L (ref 135–145)

## 2015-11-17 LAB — LACTIC ACID, PLASMA: Lactic Acid, Venous: 1.3 mmol/L (ref 0.5–2.0)

## 2015-11-17 LAB — POTASSIUM: Potassium: 3.9 mmol/L (ref 3.5–5.1)

## 2015-11-17 LAB — MAGNESIUM: Magnesium: 1.7 mg/dL (ref 1.7–2.4)

## 2015-11-17 MED ORDER — SODIUM CHLORIDE 0.9 % IV SOLN
INTRAVENOUS | Status: DC
  Administered 2015-11-17 – 2015-11-18 (×2): via INTRAVENOUS

## 2015-11-17 MED ORDER — DEXTROSE 5 % IV SOLN
2.0000 g | INTRAVENOUS | Status: DC
  Administered 2015-11-17 – 2015-11-24 (×8): 2 g via INTRAVENOUS
  Filled 2015-11-17 (×9): qty 2

## 2015-11-17 MED ORDER — LEVALBUTEROL HCL 1.25 MG/0.5ML IN NEBU
1.2500 mg | INHALATION_SOLUTION | Freq: Once | RESPIRATORY_TRACT | Status: DC
  Filled 2015-11-17: qty 0.5

## 2015-11-17 MED ORDER — POTASSIUM CHLORIDE CRYS ER 20 MEQ PO TBCR
50.0000 meq | EXTENDED_RELEASE_TABLET | Freq: Once | ORAL | Status: AC
  Administered 2015-11-17: 50 meq via ORAL
  Filled 2015-11-17: qty 2

## 2015-11-17 MED ORDER — VANCOMYCIN HCL 10 G IV SOLR
1250.0000 mg | INTRAVENOUS | Status: DC
  Administered 2015-11-17 – 2015-11-18 (×2): 1250 mg via INTRAVENOUS
  Filled 2015-11-17 (×3): qty 1250

## 2015-11-17 MED ORDER — SODIUM CHLORIDE 0.9 % IV SOLN: INTRAVENOUS | Status: DC

## 2015-11-17 NOTE — Progress Notes (Signed)
Pharmacy Antibiotic Note  [name] is a 75 y.o. female admitted on 11/16/2015 with bacteremia.  Pharmacy has been consulted for vancomycin dosing. Blood cultures show GNR and GPC/clusters (it appears that this was from the same blood collection on 4/24; ? Possible error) and cultures were redrawn later on 4/24.  -WBC= 23.5, tmax= 101.1, SCr= 1.37 (baseline ~ 1-1.1) and CrCl ~ 40 -vancomycin 2000mg  x1 was given on 4/25 at 3pm   Goal Vancomycin trough: 15-20  Plan: -Vancomycin 1250mg  IV q24h -Will follow renal function, cultures and clinical progress    Height: 5\' 5"  (165.1 cm) Weight: 186 lb 1.1 oz (84.4 kg) IBW/kg (Calculated) : 57  Temp (24hrs), Avg:98.9 F (37.2 C), Min:97.9 F (36.6 C), Max:100.3 F (37.9 C)   Recent Labs Lab 11/16/15 1303 11/16/15 1325 11/16/15 1922 11/17/15 0120 11/17/15 0411 11/17/15 1043  WBC 17.7*  --  9.2  --   --  23.5*  CREATININE 1.27*  --  1.15*  --  1.37*  --   LATICACIDVEN  --  1.67 1.8 1.3  --   --     Estimated Creatinine Clearance: 37.5 mL/min (by C-G formula based on Cr of 1.37).    Allergies  Allergen Reactions  . Prednisone Rash    jittery  . Tape Other (See Comments)    Bleeding  . Xopenex [Levalbuterol] Other (See Comments)    Made mouth and throat sore  . Cefpodoxime Other (See Comments)    Yeast infections   . Codeine Other (See Comments)    Anxious/ jittery  . Lipitor [Atorvastatin] Other (See Comments)    Made joint start hurting/ Muscle aches  . Other Swelling    Venholin HFA  . Penicillins Other (See Comments)    Has patient had a PCN reaction causing immediate rash, facial/tongue/throat swelling, SOB or lightheadedness with hypotension: unknown, childhood reaction Has patient had a PCN reaction causing severe rash involving mucus membranes or skin necrosis: No Has patient had a PCN reaction that required hospitalization No Has patient had a PCN reaction occurring within the last 10 years: No If all of the  above answers are "NO", then may proceed with Cephalosporin use.   Terald Sleeper [Kdc:Albuterol] Other (See Comments)    "jittery"  . Clindamycin Other (See Comments)    Unknown   . Hydrocodone-Acetaminophen Anxiety  . Latex Itching and Rash  . Levalbuterol Hcl Other (See Comments)    Unknown     Antimicrobials this admission: 4/24 rocephin >> 4/24 vanc>>  Microbiology results: 4/24 blood x1: GPC/clusters 1/2 4/24 blood x1: GNR 4/24 blood x2 4/24 urine- e. Coli  Thank you for allowing pharmacy to be a part of this patient's care.  Harland German, Pharm D 11/17/2015 4:49 PM

## 2015-11-17 NOTE — Progress Notes (Signed)
Dr. Valentina Lucks notified of patient's change in respiratory status and that I stopped the IV fluids.

## 2015-11-17 NOTE — Progress Notes (Signed)
Verified with Loraine Leriche in [employer] lab that an anaerobic and aerobic blood culture bottle, as well as a urine culture were sent to micro yesterday.  The gram negative preliminary results apply ONLY to the anaerobic blood bottle at this point in time.  No growth is reported to the aerobic blood bottle or the urine specimen at this time.  Dr. Valentina Lucks notified.  Orders modified based on this information.

## 2015-11-17 NOTE — Progress Notes (Signed)
According to Misty Stanley in the micro lab (ext [id number]) patient has the following micro results from the blood cultures:  Gram positive cocci in clusters in the aerobic bottle (assession #[id number]), and gram negative rods in the anaerobic bottle (assession #[id number]).

## 2015-11-17 NOTE — Progress Notes (Signed)
[facility]  Patient Status: Active (receiving services up to time of hospitalization)  [facility] is providing the following services: RN  If patient discharges after hours, please call [phone].   Kizzie Furnish 11/17/2015, 11:28 AM

## 2015-11-17 NOTE — Progress Notes (Signed)
[place] TEAM 1 - Stepdown/ICU TEAM Progress Note  [name] ZOX:[id number] DOB: Oct 31, 1940 DOA: 11/16/2015 PCP: Astrid Divine, MD  Admit HPI / Brief Narrative: 75 y.o. WF PMHx Anxiety COPD on 2 L O2 at home, Pulmonary Hypertension, HTN, Chronic Diastolic CHF, PVD, CAD, GERD, HLD,    Who presents with rapidly decompensated UTI/Pilo initially meeting surgical criteria. Over the course of the first few hours of patient's admission she rapidly deteriorate from a respiratory standpoint moving into respiratory failure. Patient's O2 saturations dropped into the low 80% range while on 3 and 4 L nasal cannula. Patient's home O2 is at 2 L. Stat ABG was ordered as well as repeat x-ray. Stat ABG showing pH 7.32, PCO2 66, PO2 80, bicarbonate 33%. Patient was placed on BiPAP. At this time patient's blood pressure has finally stabilized and she was given 60 mg IV dose of Lasix. Orders were placed to have repeat ABGs drawn 4 hours after initiation of BiPAP at which time patient's condition can be reassessed and she can be taken off BiPAP potentially. Of note lengthy discussion, greater than 30 minutes, was had with family regarding patient's care and likely progression of her treatment plan. Family aware that treatment of one problem may cause others. This is manifest by the fact the patient received a 1.3 L bolus in the ED to treat initial hypotension which resulted in her worsening heart failure and respiratory failure. Of note patient received additional fluids through treatment of IV dilution medications. Patient feeling significantly better now that she been placed on BiPAP. Family reassured. There is no need at this point time to call critical care medicine for intubation. Family is aware that we may have to do this if her condition deteriorates. Patient is a full code. Patient's antibiotics have been changed to treat her UTI specifically. Blood and urine cultures have been drawn. I would not be  surprised if patient's blood cultures come back showing gram-negative bacteremia with likely urinary tract source.  HPI/Subjective: 4/25 A/O 4, NAD. States negative CP, negative SOB, negative N/V. States does not feel back to baseline quite yet thinks she will be able to eat light foods without any postprandial N/V. Negative abdominal pain, negative back pain. View of heart failure clinic note 10/27/2015 dry weight= 175 pounds   Assessment/Plan: UTI/Pyelonephritis positive Escherichia coli  - Continue Aztreonam, Ceftriaxone  Bacteremia positive GNR/GPC clusters -Start vancomycin until blood cultures speciated  Sepsis unspecified organism   -UA positive nitrities  -Continue Antibiotics  -Lactic acid has normalized.  -Follow blood and urine cultures -See UTI/pyelonephritis -Normal saline 50 ml/hr  Leukocytosis, -See sepsis  Hypertension  -Currently patient hypotensive for a patient of this age group.  -Continue metoprolol 50 mg BID -DC spironolactone -DC Torsemide  AV Vegetation/Rheumatic Mitral Valve Stenosis -Per cardiology note 10/27/2015 Treating MV medically for now. Would be high risk for surgery with severe COPD. However, possibly could be valvuloplasty candidate. - AV VEGETATION Saw Dr Orvan Falconer 10/15/15. Does not have active endocarditis and he recommends continued observations off of antibiotics.   PFO small   Chronic Diastolic CHF/Pulmonary Hypertension:  No acute decompensation. This is followed at the Heart failure clinic on 10/27/15  -See hypertension  -Strict in and out -Obtain daily weights Filed Weights   11/16/15 1248 11/16/15 1617 11/17/15 0330  Weight: 83.008 kg (183 lb) 84.7 kg (186 lb 11.7 oz) 84.4 kg (186 lb 1.1 oz)    CAD, with a history of CABG -See hypertension  Chronic Atrial Fibrillation  on Xarelto/Cardiomyopathy  -See hypertension and diastolic CHF -Continue Xarelto  Wheezing -Xopenex 1; per allergies makes patient jittery will  monitor closely  Hyperlipidemia Continue home statins   Hypokalemia -potassium goal > 4  -K-Dur 50 mEq -Rechecked potassium and magnesium at 1300; just short of goal will monitor and correct in the A.m. if required    Acute Kidney Injury -Most likely secondary toinfection and dehydration   -Hold NSAIDs -Hold ACE/ARB -See sepsis  Deconditioning -Per cardiology note 10/27/15 patient declined Physical Therapy    Code Status: FULL Family Communication: no family present at time of exam Disposition Plan: ?? Patient refused rehabilitation last admission. Safe to go home?    Consultants: NA  Procedure/Significant Events: 3/2 TEE;- LVEF=  60%. No WMA. - Aortic valve: small 0.8 cm mobile mass on the LV side of the aortic valve. - Aorta: Grade III-IV plaque in the descending thoracic aorta. - Mitral valve: Mild to moderate  stenosis - Left atrium: moderately dilated. - Right ventricle: moderately dilated.  - Right atrium: moderately dilated.-- Atrial septum: probably a small PFO (weakly positive bubble study).   Culture 4/24 blood right hand positive GNR 4/24 blood right wrist positive GPC clusters 4/24 MRSA by PCR negative 4/24 urine positive Escherichia coli 4/24 blood left hand/Right AC NGTD   Antibiotics: Aztreonam 4/24>> Ceftriaxone 4/24>> Vancomycin 4/24>> 1; 4/25>>   DVT prophylaxis: Xarelto   Devices NA   LINES / TUBES:  NA    Continuous Infusions:   Objective: VITAL SIGNS: Temp: 98.4 F (36.9 C) (04/25 0330) Temp Source: Oral (04/25 0330) BP: 103/53 mmHg (04/25 0729) Pulse Rate: 77 (04/25 0330) SPO2; FIO2:   Intake/Output Summary (Last 24 hours) at 11/17/15 9147 Last data filed at 11/17/15 0330  Gross per 24 hour  Intake   1120 ml  Output    850 ml  Net    270 ml     Exam: General: A/O 4, NAD, No acute respiratory distress Eyes: negative scleral hemorrhage ENT: Negative Runny nose, negative gingival bleeding, Neck:  Negative  scars, masses, torticollis, lymphadenopathy, JVD Lungs: Clear to auscultation bilaterally without wheezes or crackles Cardiovascular: Irregular irregular rhythm and rate, without murmur gallop or rub normal S1 and S2 Abdomen:negative abdominal pain, nondistended, positive soft, bowel sounds, no rebound, no ascites, no appreciable mass, positive left CVA tenderness Extremities: No significant cyanosis, clubbing, or edema bilateral lower extremities Psychiatric:  Negative depression, negative anxiety, negative fatigue, negative mania  Neurologic:  Cranial nerves II through XII intact, tongue/uvula midline, all extremities muscle strength 5/5, sensation intact throughout, negative dysarthria, negative expressive aphasia, negative receptive aphasia.   Data Reviewed: Basic Metabolic Panel:  Recent Labs Lab 11/16/15 1303 11/16/15 1922 11/17/15 0411  NA 139 141 140  K 3.7 3.3* 3.2*  CL 91* 95* 96*  CO2 38* 33* 32  GLUCOSE 128* 151* 136*  BUN 15 14 17   CREATININE 1.27* 1.15* 1.37*  CALCIUM 9.6 8.8* 8.7*   Liver Function Tests:  Recent Labs Lab 11/16/15 1303 11/16/15 1922  AST 17 21  ALT 11* 15  ALKPHOS 71 79  BILITOT 0.6 1.2  PROT 7.1 6.7  ALBUMIN 3.2* 2.9*   No results for input(s): LIPASE, AMYLASE in the last 168 hours. No results for input(s): AMMONIA in the last 168 hours. CBC:  Recent Labs Lab 11/16/15 1303 11/16/15 1922  WBC 17.7* 9.2  NEUTROABS 14.4* 8.7*  HGB 9.9* 9.5*  HCT 33.2* 33.5*  MCV 85.3 86.6  PLT 219 191   Cardiac  Enzymes: No results for input(s): CKTOTAL, CKMB, CKMBINDEX, TROPONINI in the last 168 hours. BNP (last 3 results)  Recent Labs  09/24/15 1605 09/29/15 0428 11/16/15 1922  BNP 436.5* 165.4* 413.5*    ProBNP (last 3 results)  Recent Labs  01/09/15 0907  PROBNP 245.0*    CBG: No results for input(s): GLUCAP in the last 168 hours.  Recent Results (from the past 240 hour(s))  Culture, blood (Routine x 2)     Status: None  (Preliminary result)   Collection Time: 11/16/15  1:03 PM  Result Value Ref Range Status   Specimen Description BLOOD RIGHT HAND  Final   Special Requests BOTTLES DRAWN AEROBIC AND ANAEROBIC 5CC  Final   Culture  Setup Time   Final    GRAM NEGATIVE RODS ANAEROBIC BOTTLE ONLY CRITICAL RESULT CALLED TO, READ BACK BY AND VERIFIED WITH: IRBY,RN @0506  11/17/15 MKELLY    Culture   Final    GRAM NEGATIVE RODS CULTURE REINCUBATED FOR BETTER GROWTH    Report Status PENDING  Incomplete  MRSA PCR Screening     Status: None   Collection Time: 11/16/15  4:18 PM  Result Value Ref Range Status   MRSA by PCR NEGATIVE NEGATIVE Final    Comment:        The GeneXpert MRSA Assay (FDA approved for NASAL specimens only), is one component of a comprehensive MRSA colonization surveillance program. It is not intended to diagnose MRSA infection nor to guide or monitor treatment for MRSA infections.      Studies:  Recent x-ray studies have been reviewed in detail by the Attending Physician  Scheduled Meds:  Scheduled Meds: . cefTRIAXone (ROCEPHIN)  IV  1 g Intravenous Q24H  . gabapentin  300 mg Oral BID  . metoprolol  50 mg Oral BID  . potassium chloride SA  40 mEq Oral BID  . rivaroxaban  20 mg Oral QHS  . sertraline  100 mg Oral QHS  . simvastatin  40 mg Oral q1800  . spironolactone  25 mg Oral Daily  . torsemide  40 mg Oral BID    Time spent on care of this patient: 40 mins   Ercole Georg, Roselind Messier , MD  [facility] Office  [phone] Pager [phone]  On-Call/Text Page:      [email or web address]      password [email or web address]  If 7PM-7AM, please contact night-coverage [email or web address] Password [email or web address] 11/17/2015, 9:03 AM   LOS: 1 day   Care during the described time interval was provided by me .  I have reviewed this patient's available data, including medical history, events of note, physical examination, and all test results as part of my evaluation. I have personally reviewed and  interpreted all radiology studies.   Carolyne Littles, MD [phone] Pager

## 2015-11-18 DIAGNOSIS — A4151 Sepsis due to Escherichia coli [E. coli]: Principal | ICD-10-CM

## 2015-11-18 LAB — COMPREHENSIVE METABOLIC PANEL
ALT: 16 U/L (ref 14–54)
ANION GAP: 8 (ref 5–15)
AST: 32 U/L (ref 15–41)
Albumin: 2.4 g/dL — ABNORMAL LOW (ref 3.5–5.0)
Alkaline Phosphatase: 61 U/L (ref 38–126)
BUN: 22 mg/dL — ABNORMAL HIGH (ref 6–20)
CALCIUM: 8.8 mg/dL — AB (ref 8.9–10.3)
CHLORIDE: 101 mmol/L (ref 101–111)
CO2: 32 mmol/L (ref 22–32)
Creatinine, Ser: 1.03 mg/dL — ABNORMAL HIGH (ref 0.44–1.00)
GFR, EST AFRICAN AMERICAN: 60 mL/min — AB (ref >60)
GFR, EST NON AFRICAN AMERICAN: 51 mL/min — AB (ref >60)
Glucose, Bld: 128 mg/dL — ABNORMAL HIGH (ref 65–99)
Potassium: 4.9 mmol/L (ref 3.5–5.1)
SODIUM: 141 mmol/L (ref 135–145)
Total Bilirubin: 0.5 mg/dL (ref 0.3–1.2)
Total Protein: 6.2 g/dL — ABNORMAL LOW (ref 6.5–8.1)

## 2015-11-18 LAB — URINE CULTURE: Culture: >=100000 — AB

## 2015-11-18 LAB — CBC WITH DIFFERENTIAL/PLATELET
BASOS PCT: 0 %
Basophils Absolute: 0 10*3/uL (ref 0.0–0.1)
Eosinophils Absolute: 0.1 10*3/uL (ref 0.0–0.7)
Eosinophils Relative: 0 %
HEMATOCRIT: 28.4 % — AB (ref 36.0–46.0)
HEMOGLOBIN: 8.1 g/dL — AB (ref 12.0–15.0)
Lymphocytes Relative: 6 %
Lymphs Abs: 1.1 10*3/uL (ref 0.7–4.0)
MCH: 24.9 pg — ABNORMAL LOW (ref 26.0–34.0)
MCHC: 28.5 g/dL — AB (ref 30.0–36.0)
MCV: 87.4 fL (ref 78.0–100.0)
MONOS PCT: 8 %
Monocytes Absolute: 1.3 10*3/uL — ABNORMAL HIGH (ref 0.1–1.0)
NEUTROS ABS: 15 10*3/uL — AB (ref 1.7–7.7)
NEUTROS PCT: 86 %
Platelets: 164 10*3/uL (ref 150–400)
RBC: 3.25 MIL/uL — AB (ref 3.87–5.11)
RDW: 14.6 % (ref 11.5–15.5)
WBC: 17.4 10*3/uL — AB (ref 4.0–10.5)

## 2015-11-18 LAB — PROCALCITONIN: PROCALCITONIN: 26.62 ng/mL

## 2015-11-18 LAB — MAGNESIUM: MAGNESIUM: 1.8 mg/dL (ref 1.7–2.4)

## 2015-11-18 MED ORDER — IPRATROPIUM BROMIDE 0.02 % IN SOLN
RESPIRATORY_TRACT | Status: AC
  Administered 2015-11-18: 0.5 mg
  Filled 2015-11-18: qty 2.5

## 2015-11-18 MED ORDER — LEVOFLOXACIN IN D5W 750 MG/150ML IV SOLN: 750.0000 mg | Freq: Once | INTRAVENOUS | Status: DC

## 2015-11-18 MED ORDER — SPIRONOLACTONE 25 MG PO TABS
25.0000 mg | ORAL_TABLET | Freq: Every day | ORAL | Status: DC
  Administered 2015-11-18 – 2015-11-24 (×7): 25 mg via ORAL
  Filled 2015-11-18 (×7): qty 1

## 2015-11-18 MED ORDER — FUROSEMIDE 10 MG/ML IJ SOLN
80.0000 mg | Freq: Two times a day (BID) | INTRAMUSCULAR | Status: DC
  Administered 2015-11-18 – 2015-11-20 (×5): 80 mg via INTRAVENOUS
  Filled 2015-11-18 (×5): qty 8

## 2015-11-18 MED ORDER — IPRATROPIUM BROMIDE 0.02 % IN SOLN: 0.5000 mg | RESPIRATORY_TRACT | Status: DC | PRN

## 2015-11-18 MED ORDER — DEXTROSE 5 % IV SOLN: 2.0000 g | Freq: Once | INTRAVENOUS | Status: DC

## 2015-11-18 NOTE — Progress Notes (Signed)
Patient requesting to be taken off bipap, stating, " I can't take this pressure on my nose anymore". Pt taken off bipap and placed on 3L [place]. VSS. Will continue to monitor.

## 2015-11-18 NOTE — Progress Notes (Signed)
Per patient, removed Bipap due to patient  C/o dry mouth. Patient still with increase working breathing. Explained to patient that we would be unable to place Bipap back on while patient drinking for at least 30 minutes after last PO intake. Patients states she undestands

## 2015-11-18 NOTE — Progress Notes (Signed)
[place] TEAM 1 - Stepdown/ICU TEAM PROGRESS NOTE  [name] NGE:[id number] DOB: [birth year] DOA: 11/16/2015 PCP: Astrid Divine, MD  Admit HPI / Brief Narrative: 75 y.o. F Hx Anxiety, COPD on 2 L O2, Pulmonary HTN, HTN, Chronic Diastolic CHF, PVD, CAD, GERD, and HLD who presented with Sepsis due to UTI/Pyelo.  Over the course of the first few hours of patient's admission she rapidly deteriorated from a respiratory standpoint w/ O2 saturations dropping into the low 80% on 3-4 L nasal cannula. ABG noted pH 7.32, PCO2 66, PO2 80, bicarbonate 33%. Patient was placed on BiPAP.   HPI/Subjective: The pt has been somewhat anxious today, and has c/o SOB this afternoon.  Her saturations have been stable but she has been somewhat tachypneic requiring return to BiPAP.  She denies chest pain headache fevers chills or abdominal pain.  Assessment/Plan:  Sepsis due to Escherichia coli UTI / Pyelonephritis w/ bacteremia  -Continue Ceftriaxone  GPC clusters bacteremia -possible contaminant, though in this pt w/ a complex hx of possible AoV endocarditis we must consider it a possible pathogen - Vancomycin until blood cultures speciated - may require further ID comment   Hypertension  -BP stable at this time   AoV Vegetation -saw Dr Orvan Falconer 10/15/15 - he recommended continued observations off of antibiotics at that time - see discussion above   Rheumatic Mitral Valve Stenosis -Per Cardiology 10/27/2015 treating MV medically for now - high risk for surgery with severe COPD  Small PFO  Chronic Diastolic CHF / Pulmonary Hypertension/ R Heart failure  Dry weight ~178# (80.7kg), but usual weight appears to be closer to 83kg - appears volume overloaded at present, w/ pulm crackles and LE edema - IV lasix and follow   Filed Weights   11/16/15 1617 11/17/15 0330 11/18/15 0415  Weight: 84.7 kg (186 lb 11.7 oz) 84.4 kg (186 lb 1.1 oz) 86.6 kg (190 lb 14.7 oz)   CAD - history of  CABG -asymptomatic at present   Parox Atrial Fibrillation -presently in NSR -Continue Xarelto  Hyperlipidemia Continue home statins  Hypokalemia Follow K+ w/ diuresis - she has a hx of profound hypokalemia   Recent Labs Lab 11/16/15 1303 11/16/15 1922 11/17/15 0411 11/17/15 1043 11/18/15 0458  K 3.7 3.3* 3.2* 3.9 4.9     Acute Kidney Injury Improving - follow w/ diuresis   Recent Labs Lab 11/16/15 1303 11/16/15 1922 11/17/15 0411 11/18/15 0458  CREATININE 1.27* 1.15* 1.37* 1.03*    Deconditioning -PT/OT   Code Status: FULL Family Communication: Spoke with son at bedside Disposition Plan: SDU  Consultants: none  Procedures: none  Antibiotics: Aztreonam 4/24  Ceftriaxone 4/25 > Vancomycin 4/24 >  DVT prophylaxis: Xarelto   Objective: Blood pressure 109/77, pulse 94, temperature 98.3 F (36.8 C), temperature source Oral, resp. rate 31, height 5\' 5"  (1.651 m), weight 86.6 kg (190 lb 14.7 oz), SpO2 98 %.  Intake/Output Summary (Last 24 hours) at 11/18/15 1201 Last data filed at 11/18/15 0800  Gross per 24 hour  Intake    790 ml  Output    650 ml  Net    140 ml   Exam: General: mild resp distress w/ tachypnea - some anxiety as well  Lungs: Clear to auscultation bilaterally without wheezes or crackles Cardiovascular: Regular rate and rhythm without gallop or rub - 3/6 SEM Abdomen: Nontender, nondistended, soft, bowel sounds positive, no rebound, no ascites, no appreciable mass Extremities: No significant cyanosis, or clubbing;  1+edema bilateral lower extremities  Data Reviewed:  Basic Metabolic Panel:  Recent Labs Lab 11/16/15 1303 11/16/15 1922 11/17/15 0411 11/17/15 1043 11/18/15 0458  NA 139 141 140  --  141  K 3.7 3.3* 3.2* 3.9 4.9  CL 91* 95* 96*  --  101  CO2 38* 33* 32  --  32  GLUCOSE 128* 151* 136*  --  128*  BUN --  22*  CREATININE 1.27* 1.15* 1.37*  --  1.03*  CALCIUM 9.6 8.8* 8.7*  --  8.8*  MG  --   --   --   1.7 1.8    CBC:  Recent Labs Lab 11/16/15 1303 11/16/15 1922 11/17/15 1043 11/18/15 0458  WBC 17.7* 9.2 23.5* 17.4*  NEUTROABS 14.4* 8.7* 19.7* 15.0*  HGB 9.9* 9.5* 8.4* 8.1*  HCT 33.2* 33.5* 29.4* 28.4*  MCV 85.3 86.6 86.0 87.4  PLT 219 191 178 164    Liver Function Tests:  Recent Labs Lab 11/16/15 1303 11/16/15 1922 11/18/15 0458  AST 17 21 32  ALT 11* 15 16  ALKPHOS 71 79 61  BILITOT 0.6 1.2 0.5  PROT 7.1 6.7 6.2*  ALBUMIN 3.2* 2.9* 2.4*    Coags:  Recent Labs Lab 11/16/15 1922  INR 1.54*    Recent Labs Lab 11/16/15 1922  APTT 29    Recent Results (from the past 240 hour(s))  Culture, blood (Routine x 2)     Status: None (Preliminary result)   Collection Time: 11/16/15  1:03 PM  Result Value Ref Range Status   Specimen Description BLOOD RIGHT HAND  Final   Special Requests BOTTLES DRAWN AEROBIC AND ANAEROBIC 5CC  Final   Culture  Setup Time   Final    GRAM NEGATIVE RODS ANAEROBIC BOTTLE ONLY CRITICAL RESULT CALLED TO, READ BACK BY AND VERIFIED WITH: IRBY,RN  11/17/15 MKELLY    Culture GRAM NEGATIVE RODS SUSCEPTIBILITIES TO FOLLOW   Final   Report Status PENDING  Incomplete  Culture, blood (Routine x 2)     Status: None (Preliminary result)   Collection Time: 11/16/15  1:05 PM  Result Value Ref Range Status   Specimen Description BLOOD WRIST RIGHT  Final   Special Requests BOTTLES DRAWN AEROBIC AND ANAEROBIC 5CC  Final   Culture  Setup Time   Final    GRAM POSITIVE COCCI IN CLUSTERS AEROBIC BOTTLE ONLY CRITICAL RESULT CALLED TO, READ BACK BY AND VERIFIED WITH: Jenetta Downer RN 1551 11/17/15 A BROWNING    Culture GRAM POSITIVE COCCI  Final   Report Status PENDING  Incomplete  Urine culture     Status: Abnormal   Collection Time: 11/16/15  2:11 PM  Result Value Ref Range Status   Specimen Description URINE, CLEAN CATCH  Final   Special Requests NONE  Final   Culture >=100,000 COLONIES/mL ESCHERICHIA COLI (A)  Final   Report Status  11/18/2015 FINAL  Final   Organism ID, Bacteria ESCHERICHIA COLI (A)  Final      Susceptibility   Escherichia coli - MIC*    AMPICILLIN >=32 RESISTANT Resistant     CEFAZOLIN <=4 SENSITIVE Sensitive     CEFTRIAXONE <=1 SENSITIVE Sensitive     CIPROFLOXACIN <=0.25 SENSITIVE Sensitive     GENTAMICIN <=1 SENSITIVE Sensitive     IMIPENEM <=0.25 SENSITIVE Sensitive     NITROFURANTOIN <=16 SENSITIVE Sensitive     TRIMETH/SULFA <=20 SENSITIVE Sensitive     AMPICILLIN/SULBACTAM >=32 RESISTANT Resistant     PIP/TAZO 64 INTERMEDIATE Intermediate     * >=  100,000 COLONIES/mL ESCHERICHIA COLI  MRSA PCR Screening     Status: None   Collection Time: 11/16/15  4:18 PM  Result Value Ref Range Status   MRSA by PCR NEGATIVE NEGATIVE Final    Comment:        The GeneXpert MRSA Assay (FDA approved for NASAL specimens only), is one component of a comprehensive MRSA colonization surveillance program. It is not intended to diagnose MRSA infection nor to guide or monitor treatment for MRSA infections.   Culture, blood (x 2)     Status: None (Preliminary result)   Collection Time: 11/16/15  7:16 PM  Result Value Ref Range Status   Specimen Description BLOOD LEFT HAND  Final   Special Requests IN PEDIATRIC BOTTLE 1CC   Final   Culture NO GROWTH < 24 HOURS  Final   Report Status PENDING  Incomplete  Culture, blood (x 2)     Status: None (Preliminary result)   Collection Time: 11/16/15  7:22 PM  Result Value Ref Range Status   Specimen Description BLOOD RIGHT ANTECUBITAL  Final   Special Requests IN PEDIATRIC BOTTLE 1CC   Final   Culture NO GROWTH < 24 HOURS  Final   Report Status PENDING  Incomplete     Studies:   Recent x-ray studies have been reviewed in detail by the Attending Physician  Scheduled Meds:  Scheduled Meds: . cefTRIAXone (ROCEPHIN)  IV  2 g Intravenous Q24H  . gabapentin  300 mg Oral BID  . levalbuterol  1.25 mg Nebulization Once  . metoprolol  50 mg Oral BID  . potassium  chloride SA  40 mEq Oral BID  . rivaroxaban  20 mg Oral QHS  . sertraline  100 mg Oral QHS  . simvastatin  40 mg Oral q1800  . vancomycin  1,250 mg Intravenous Q24H    Time spent on care of this patient: 35 mins   Demontrae [facility] T , MD   [facility] Office  [phone] Pager - Text Page per Loretha Stapler as per below:  On-Call/Text Page:      [email or web address]      password [email or web address]  If 7PM-7AM, please contact night-coverage [email or web address] Password [email or web address] 11/18/2015, 12:01 PM   LOS: 2 days

## 2015-11-18 NOTE — Clinical Documentation Improvement (Signed)
Internal Medicine  Please clarify respiratory status in progress notes and discharge summary  Acute respiratory failure  Acute on Chronic respiratory failure  Chronic respiratory failure  Other  Clinically Undetermined  Document any associated diagnoses/conditions.   Supporting Information:  75 year old with History of COPD requiring O2 @ 2L/m via [place] 24/7, pulmonary htn, Chronic diastolic CHF and CAD admitted with sepsis due to E. Coli UTI/pyelonephritis w/bacteremia BP 115/53 mmHg  Pulse 75  Temp(Src) 98.2 F (36.8 C) (Oral)  Resp 26   During admission RR 16-41 Currently onl BiPAP 30%  H&P Over the course of the first few hours of patient's admission she rapidly deteriorate from a respiratory standpoint moving into respiratory failure. Patient's O2 saturations dropped into the low 80% range while on 3 and 4 L nasal cannula. Patient's home O2 is at 2 L. Stat ABG was ordered as well as repeat x-ray. Stat ABG showing pH 7.32, PCO2 66, PO2 80, bicarbonate 33%. Patient was placed on BiPAP. At this time patient's blood pressure has finally stabilized and she was given 60 mg IV dose of Lasix. Orders were placed to have repeat ABGs drawn 4 hours after initiation of BiPAP at which time patient's condition can be reassessed and she can be taken off BiPAP potentially.   4/25 progress note Who presents with rapidly decompensated UTI/Pilo initially meeting surgical criteria. Over the course of the first few hours of patient's admission she rapidly deteriorate from a respiratory standpoint moving into respiratory failure. Patient's O2 saturations dropped into the low 80% range while on 3 and 4 L nasal cannula. Patient's home O2 is at 2 L. Stat ABG was ordered as well as repeat x-ray. Stat ABG showing pH 7.32, PCO2 66, PO2 80, bicarbonate 33%. Patient was placed on BiPAP. At this time patient's blood pressure has finally stabilized and she was given 60 mg IV dose of Lasix. Orders were placed to  have repeat ABGs drawn 4 hours after initiation of BiPAP at which time patient's condition can be reassessed and she can be taken off BiPAP potentially Patient feeling significantly better now that she been placed on BiPAP.  4/26 progress note: Over the course of the first few hours of patient's admission she rapidly deteriorated from a respiratory standpoint w/ O2 saturations dropping into the low 80% on 3-4 L nasal cannula. ABG noted pH 7.32, PCO2 66, PO2 80, bicarbonate 33%. Patient was placed on BiPAP.   Component     Latest Ref Rng 11/16/2015         7:00 PM  FIO2      0.50  Delivery systems      BILEVEL POSITIVE AIRWAY PRESSURE  LHR      15  Peep/cpap        Inspiratory PAP      16  Expiratory PAP      8  pH, Arterial     7.350 - 7.450 7.323 (L)  pCO2 arterial     35.0 - 45.0 mmHg 66.3 (HH)  pO2, Arterial     80.0 - 100.0 mmHg 80.9  Bicarbonate     20.0 - 24.0 mEq/L 33.4 (H)  TCO2     0 - 100 mmol/L 35.5  Acid-Base Excess     0.0 - 2.0 mmol/L 7.5 (H)  O2 Saturation      94.4  Patient temperature      98.6  Collection site      RIGHT RADIAL  Drawn by  [id number]  Sample type      ARTERIAL  Allens test (pass/fail)     PASS PASS   Component     Latest Ref Rng 11/16/2015        10:25 PM  FIO2      0.50  Delivery systems      BILEVEL POSITIVE AIRWAY PRESSURE  LHR      15  Peep/cpap      8.0  Inspiratory PAP      16  Expiratory PAP      8  pH, Arterial     7.350 - 7.450 7.406  pCO2 arterial     35.0 - 45.0 mmHg 55.4 (H)  pO2, Arterial     80.0 - 100.0 mmHg 135 (H)  Bicarbonate     20.0 - 24.0 mEq/L 34.1 (H)  TCO2     0 - 100 mmol/L 35.8  Acid-Base Excess     0.0 - 2.0 mmol/L 9.2 (H)  O2 Saturation      98.9  Patient temperature      98.6  Collection site      BRACHIAL ARTERY  Drawn by      [id number]  Sample type      ARTERIAL  Allens test (pass/fail)     PASS PASS    Treatments  [place], Nonrebreather, & BiPAP ABG Oxygen therapy - Keep  O2 sats greater than 92%  Please exercise your independent, professional judgment when responding. A specific answer is not anticipated or expected.   Thank You,  Harless Litten [facility] [place] [phone]

## 2015-11-18 NOTE — Progress Notes (Signed)
Patient off BIPAP, resting, on 3 LPM nasal cannula. SPO2 99%. No distress. RT will continue to monitor.

## 2015-11-19 DIAGNOSIS — B9620 Unspecified Escherichia coli [E. coli] as the cause of diseases classified elsewhere: Secondary | ICD-10-CM | POA: Diagnosis present

## 2015-11-19 DIAGNOSIS — Q2112 Patent foramen ovale: Secondary | ICD-10-CM | POA: Diagnosis present

## 2015-11-19 DIAGNOSIS — I272 Other secondary pulmonary hypertension: Secondary | ICD-10-CM

## 2015-11-19 DIAGNOSIS — I059 Rheumatic mitral valve disease, unspecified: Secondary | ICD-10-CM | POA: Diagnosis present

## 2015-11-19 DIAGNOSIS — A419 Sepsis, unspecified organism: Secondary | ICD-10-CM | POA: Diagnosis present

## 2015-11-19 DIAGNOSIS — N390 Urinary tract infection, site not specified: Secondary | ICD-10-CM

## 2015-11-19 DIAGNOSIS — Q211 Atrial septal defect: Secondary | ICD-10-CM | POA: Diagnosis present

## 2015-11-19 DIAGNOSIS — R7881 Bacteremia: Secondary | ICD-10-CM | POA: Diagnosis present

## 2015-11-19 DIAGNOSIS — I429 Cardiomyopathy, unspecified: Secondary | ICD-10-CM | POA: Diagnosis present

## 2015-11-19 LAB — CBC WITH DIFFERENTIAL/PLATELET
Basophils Absolute: 0 10*3/uL (ref 0.0–0.1)
Basophils Relative: 0 %
Eosinophils Absolute: 0.1 10*3/uL (ref 0.0–0.7)
Eosinophils Relative: 1 %
HCT: 29.4 % — ABNORMAL LOW (ref 36.0–46.0)
HEMOGLOBIN: 8.3 g/dL — AB (ref 12.0–15.0)
LYMPHS ABS: 0.7 10*3/uL (ref 0.7–4.0)
LYMPHS PCT: 5 %
MCH: 24.7 pg — AB (ref 26.0–34.0)
MCHC: 28.2 g/dL — ABNORMAL LOW (ref 30.0–36.0)
MCV: 87.5 fL (ref 78.0–100.0)
Monocytes Absolute: 1.1 10*3/uL — ABNORMAL HIGH (ref 0.1–1.0)
Monocytes Relative: 8 %
NEUTROS PCT: 86 %
Neutro Abs: 11.3 10*3/uL — ABNORMAL HIGH (ref 1.7–7.7)
Platelets: 192 10*3/uL (ref 150–400)
RBC: 3.36 MIL/uL — AB (ref 3.87–5.11)
RDW: 14.5 % (ref 11.5–15.5)
WBC: 13.2 10*3/uL — AB (ref 4.0–10.5)

## 2015-11-19 LAB — CULTURE, BLOOD (ROUTINE X 2)

## 2015-11-19 LAB — COMPREHENSIVE METABOLIC PANEL
ALT: 30 U/L (ref 14–54)
ANION GAP: 10 (ref 5–15)
AST: 47 U/L — ABNORMAL HIGH (ref 15–41)
Albumin: 2.6 g/dL — ABNORMAL LOW (ref 3.5–5.0)
Alkaline Phosphatase: 84 U/L (ref 38–126)
BUN: 19 mg/dL (ref 6–20)
CHLORIDE: 96 mmol/L — AB (ref 101–111)
CO2: 37 mmol/L — ABNORMAL HIGH (ref 22–32)
Calcium: 9 mg/dL (ref 8.9–10.3)
Creatinine, Ser: 0.86 mg/dL (ref 0.44–1.00)
Glucose, Bld: 146 mg/dL — ABNORMAL HIGH (ref 65–99)
POTASSIUM: 4 mmol/L (ref 3.5–5.1)
Sodium: 143 mmol/L (ref 135–145)
Total Bilirubin: 0.5 mg/dL (ref 0.3–1.2)
Total Protein: 7 g/dL (ref 6.5–8.1)

## 2015-11-19 LAB — MAGNESIUM: MAGNESIUM: 1.9 mg/dL (ref 1.7–2.4)

## 2015-11-19 NOTE — Progress Notes (Signed)
[place] TEAM 1 - Stepdown/ICU TEAM Progress Note  [name] ZOX:[id number] DOB: 07-May-1941 DOA: 11/16/2015 PCP: Astrid Divine, MD  Admit HPI / Brief Narrative: 75 y.o. WF PMHx Anxiety COPD on 2 L O2 at home, Pulmonary Hypertension, HTN, Chronic Diastolic CHF, PVD, CAD, GERD, HLD,    Who presents with rapidly decompensated UTI/Pilo initially meeting surgical criteria. Over the course of the first few hours of patient's admission she rapidly deteriorate from a respiratory standpoint moving into respiratory failure. Patient's O2 saturations dropped into the low 80% range while on 3 and 4 L nasal cannula. Patient's home O2 is at 2 L. Stat ABG was ordered as well as repeat x-ray. Stat ABG showing pH 7.32, PCO2 66, PO2 80, bicarbonate 33%. Patient was placed on BiPAP. At this time patient's blood pressure has finally stabilized and she was given 60 mg IV dose of Lasix. Orders were placed to have repeat ABGs drawn 4 hours after initiation of BiPAP at which time patient's condition can be reassessed and she can be taken off BiPAP potentially. Of note lengthy discussion, greater than 30 minutes, was had with family regarding patient's care and likely progression of her treatment plan. Family aware that treatment of one problem may cause others. This is manifest by the fact the patient received a 1.3 L bolus in the ED to treat initial hypotension which resulted in her worsening heart failure and respiratory failure. Of note patient received additional fluids through treatment of IV dilution medications. Patient feeling significantly better now that she been placed on BiPAP. Family reassured. There is no need at this point time to call critical care medicine for intubation. Family is aware that we may have to do this if her condition deteriorates. Patient is a full code. Patient's antibiotics have been changed to treat her UTI specifically. Blood and urine cultures have been drawn. I would not be  surprised if patient's blood cultures come back showing gram-negative bacteremia with likely urinary tract source.  HPI/Subjective: 4/27 A/O 4, NAD. Sitting in chair comfortably. States negative CP, negative SOB, negative N/V. Review of heart failure clinic note 10/27/2015 dry weight= 175 pounds   Assessment/Plan: Sepsis UTI/Pyelonephritis positive Escherichia coli  -Continue Ceftriaxone -Normal saline 50 ml/hr  Bacteremia positive Escherichia coli/coag negative staph (most likely contaminant) -DC vancomycin coag negative staph most likely contaminant  -Patient leukocytosis improving, may be ready to transition to PO antibiotics in A.m. will have patient complete 2 weeks antibiotics.   Leukocytosis, -See sepsis; improving  Hypertension  -Continue metoprolol 50 mg BID -Lasix 80 mg BID -Spironolactone 25 mg daily   AV Vegetation/Rheumatic Mitral Valve Stenosis -Per cardiology note 10/27/2015 Treating MV medically for now. Would be high risk for surgery with severe COPD. However, possibly could be valvuloplasty candidate. - AV VEGETATION Saw Dr Orvan Falconer 10/15/15. Does not have active endocarditis and he recommends continued observations off of antibiotics.   PFO small   Chronic Diastolic CHF/Pulmonary Hypertension:  No acute decompensation. 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 Patient refused rehabilitation last admission. Safe to go home?    Consultants: NA  Procedure/Significant Events: 3/2 TEE;- LVEF=  60%. No WMA. - Aortic valve: small 0.8 cm mobile mass on the LV side of the aortic valve. - Aorta: Grade III-IV plaque in the descending thoracic aorta. - Mitral valve: Mild to moderate  stenosis - Left atrium: moderately dilated. - Right ventricle: moderately dilated.  - Right atrium: moderately dilated.-- Atrial septum: probably a small PFO (weakly positive bubble study).   Culture 4/24 blood right hand positive Escherichia coli 4/24 blood right wrist positive coag negative staph (most likely contaminant) 4/24 MRSA by PCR negative 4/24 urine positive Escherichia coli 4/24 blood left hand/Right AC NGTD   Antibiotics: Aztreonam 4/24>> 4/26 Vancomycin 4/24>> 1; 4/25>> 4/27 Ceftriaxone 4/24>>   DVT prophylaxis: Xarelto   Devices NA   LINES / TUBES:  NA    Continuous Infusions:   Objective: VITAL SIGNS: Temp: 98.7 F (37.1 C) (04/27 0751) Temp Source: Oral (04/27 0751) BP: 123/53 mmHg (04/27 1014) Pulse Rate: 74 (04/27 1018) SPO2; FIO2:   Intake/Output Summary (Last 24 hours) at 11/19/15 1107 Last data filed at 11/19/15 1016  Gross per 24 hour  Intake 843.33 ml  Output   3850 ml  Net -3006.67 ml     Exam: General: A/O 4, NAD, No acute respiratory distress Eyes: negative scleral hemorrhage ENT: Negative Runny nose, negative gingival bleeding, Neck:  Negative scars, masses, torticollis, lymphadenopathy, JVD Lungs: Clear to auscultation bilaterally without wheezes or crackles Cardiovascular: Irregular  irregular rhythm and rate, without murmur gallop or rub normal S1 and S2 Abdomen:negative abdominal pain, nondistended, positive soft, bowel sounds, no rebound, no ascites, no appreciable mass, positive left CVA tenderness Extremities: No significant cyanosis, clubbing, or edema bilateral lower extremities Psychiatric:  Negative depression, negative anxiety, negative fatigue, negative mania  Neurologic:  Cranial nerves II through XII intact, tongue/uvula midline, all extremities muscle strength 5/5, sensation intact throughout, negative dysarthria, negative expressive aphasia, negative receptive aphasia.   Data Reviewed: Basic Metabolic Panel:  Recent Labs Lab 11/16/15 1303 11/16/15 1922 11/17/15 0411 11/17/15 1043 11/18/15 0458 11/19/15 0404  NA 139 141 140  --  141 143  K 3.7 3.3* 3.2* 3.9 4.9 4.0  CL 91* 95* 96*  --  101 96*  CO2 38* 33* 32  --  32 37*  GLUCOSE 128* 151* 136*  --  128* 146*  BUN --  22* 19  CREATININE 1.27* 1.15* 1.37*  --  1.03* 0.86  CALCIUM 9.6 8.8* 8.7*  --  8.8* 9.0  MG  --   --   --  1.7 1.8 1.9   Liver Function Tests:  Recent Labs Lab 11/16/15 1303 11/16/15 1922 11/18/15 0458 11/19/15 0404  AST 17 21 32 47*  ALT 11* ALKPHOS 71 79 61 84  BILITOT 0.6 1.2 0.5 0.5  PROT 7.1 6.7 6.2* 7.0  ALBUMIN 3.2* 2.9* 2.4* 2.6*   No results for input(s): LIPASE, AMYLASE in the last 168 hours. No results for input(s): AMMONIA in the last 168 hours. CBC:  Recent Labs Lab 11/16/15 1303 11/16/15 1922 11/17/15 1043 11/18/15 0458 11/19/15 0404  WBC 17.7* 9.2 23.5* 17.4* 13.2*  NEUTROABS 14.4* 8.7* 19.7* 15.0* 11.3*  HGB 9.9* 9.5* 8.4* 8.1* 8.3*  HCT 33.2* 33.5* 29.4* 28.4* 29.4*  MCV 85.3 86.6 86.0 87.4 87.5  PLT 219 191 178 164 192   Cardiac Enzymes: No results for input(s): CKTOTAL, CKMB, CKMBINDEX, TROPONINI in the last 168 hours. BNP (last 3 results)  Recent Labs  09/24/15 1605 09/29/15 0428 11/16/15 1922  BNP 436.5*  165.4* 413.5*    ProBNP (last 3 results)  Recent Labs  01/09/15 0907  PROBNP 245.0*    CBG: No results for input(s): GLUCAP in the last 168 hours.  Recent Results (from the past 240 hour(s))  Culture, blood (Routine x 2)     Status: Abnormal   Collection Time: 11/16/15  1:03 PM  Result Value Ref Range Status   Specimen Description BLOOD RIGHT HAND  Final   Special Requests BOTTLES DRAWN AEROBIC AND ANAEROBIC 5CC  Final   Culture  Setup Time   Final    GRAM NEGATIVE RODS ANAEROBIC BOTTLE ONLY CRITICAL RESULT CALLED TO, READ BACK BY AND VERIFIED WITH: IRBY,RN @0506  11/17/15 MKELLY    Culture ESCHERICHIA COLI (A)  Final   Report Status 11/19/2015 FINAL  Final   Organism ID, Bacteria ESCHERICHIA COLI  Final      Susceptibility   Escherichia coli - MIC*    AMPICILLIN >=32 RESISTANT Resistant     CEFAZOLIN <=4 SENSITIVE Sensitive     CEFEPIME <=1 SENSITIVE Sensitive     CEFTAZIDIME <=1 SENSITIVE Sensitive     CEFTRIAXONE <=1 SENSITIVE Sensitive     CIPROFLOXACIN <=0.25 SENSITIVE Sensitive     GENTAMICIN <=1 SENSITIVE Sensitive     IMIPENEM <=0.25 SENSITIVE Sensitive     TRIMETH/SULFA <=20 SENSITIVE Sensitive     AMPICILLIN/SULBACTAM >=32 RESISTANT Resistant     PIP/TAZO 64 INTERMEDIATE Intermediate     * ESCHERICHIA COLI  Culture, blood (Routine x 2)     Status: Abnormal   Collection Time: 11/16/15  1:05 PM  Result Value Ref Range Status   Specimen Description BLOOD WRIST RIGHT  Final   Special Requests BOTTLES DRAWN AEROBIC AND ANAEROBIC 5CC  Final   Culture  Setup Time   Final    GRAM POSITIVE COCCI IN CLUSTERS AEROBIC BOTTLE ONLY CRITICAL RESULT CALLED TO, READ BACK BY AND VERIFIED WITH: Jenetta Downer RN 1551 11/17/15 A BROWNING    Culture (A)  Final    STAPHYLOCOCCUS SPECIES (COAGULASE NEGATIVE) THE SIGNIFICANCE OF ISOLATING THIS ORGANISM FROM A SINGLE SET OF BLOOD CULTURES WHEN MULTIPLE SETS ARE DRAWN IS UNCERTAIN. PLEASE NOTIFY THE MICROBIOLOGY DEPARTMENT WITHIN ONE  WEEK IF SPECIATION AND SENSITIVITIES ARE REQUIRED.    Report Status 11/19/2015 FINAL  Final  Urine culture     Status: Abnormal   Collection Time: 11/16/15  2:11 PM  Result Value Ref Range Status   Specimen Description URINE, CLEAN CATCH  Final   Special Requests NONE  Final   Culture >=100,000 COLONIES/mL ESCHERICHIA COLI (A)  Final   Report Status 11/18/2015 FINAL  Final   Organism ID, Bacteria ESCHERICHIA COLI (A)  Final      Susceptibility   Escherichia coli - MIC*    AMPICILLIN >=32 RESISTANT Resistant     CEFAZOLIN <=4 SENSITIVE Sensitive     CEFTRIAXONE <=1 SENSITIVE Sensitive     CIPROFLOXACIN <=0.25 SENSITIVE Sensitive     GENTAMICIN <=1 SENSITIVE  Sensitive     IMIPENEM <=0.25 SENSITIVE Sensitive     NITROFURANTOIN <=16 SENSITIVE Sensitive     TRIMETH/SULFA <=20 SENSITIVE Sensitive     AMPICILLIN/SULBACTAM >=32 RESISTANT Resistant     PIP/TAZO 64 INTERMEDIATE Intermediate     * >=100,000 COLONIES/mL ESCHERICHIA COLI  MRSA PCR Screening     Status: None   Collection Time: 11/16/15  4:18 PM  Result Value Ref Range Status   MRSA by PCR NEGATIVE NEGATIVE Final    Comment:        The GeneXpert MRSA Assay (FDA approved for NASAL specimens only), is one component of a comprehensive MRSA colonization surveillance program. It is not intended to diagnose MRSA infection nor to guide or monitor treatment for MRSA infections.   Culture, blood (x 2)     Status: None (Preliminary result)   Collection Time: 11/16/15  7:16 PM  Result Value Ref Range Status   Specimen Description BLOOD LEFT HAND  Final   Special Requests IN PEDIATRIC BOTTLE 1CC   Final   Culture NO GROWTH 2 DAYS  Final   Report Status PENDING  Incomplete  Culture, blood (x 2)     Status: None (Preliminary result)   Collection Time: 11/16/15  7:22 PM  Result Value Ref Range Status   Specimen Description BLOOD RIGHT ANTECUBITAL  Final   Special Requests IN PEDIATRIC BOTTLE 1CC   Final   Culture NO GROWTH 2  DAYS  Final   Report Status PENDING  Incomplete     Studies:  Recent x-ray studies have been reviewed in detail by the Attending Physician  Scheduled Meds:  Scheduled Meds: . cefTRIAXone (ROCEPHIN)  IV  2 g Intravenous Q24H  . furosemide  80 mg Intravenous Q12H  . gabapentin  300 mg Oral BID  . metoprolol  50 mg Oral BID  . potassium chloride SA  40 mEq Oral BID  . rivaroxaban  20 mg Oral QHS  . sertraline  100 mg Oral QHS  . simvastatin  40 mg Oral q1800  . spironolactone  25 mg Oral Daily    Time spent on care of this patient: 40 mins   Cherilyn Sautter, Roselind Messier , MD  [facility] Office  [phone] Pager - [phone]  On-Call/Text Page:      [email or web address]      password [email or web address]  If 7PM-7AM, please contact night-coverage [email or web address] Password [email or web address] 11/19/2015, 11:07 AM   LOS: 3 days   Care during the described time interval was provided by me .  I have reviewed this patient's available data, including medical history, events of note, physical examination, and all test results as part of my evaluation. I have personally reviewed and interpreted all radiology studies.   Carolyne Littles, MD [phone] Pager

## 2015-11-19 NOTE — Care Management Important Message (Signed)
Important Message  Patient Details  Name: [name] MRN: [id number] Date of Birth: Oct 15, 1940   Medicare Important Message Given:  Yes    Kyla Balzarine 11/19/2015, 1:43 PM

## 2015-11-20 LAB — PROCALCITONIN: Procalcitonin: 6.25 ng/mL

## 2015-11-20 MED ORDER — TORSEMIDE 20 MG PO TABS
40.0000 mg | ORAL_TABLET | Freq: Two times a day (BID) | ORAL | Status: DC
  Administered 2015-11-21 – 2015-11-24 (×7): 40 mg via ORAL
  Filled 2015-11-20 (×7): qty 2

## 2015-11-20 NOTE — Progress Notes (Signed)
Report given to nurse on 3E11 and daughter is informed of pt being transferred.

## 2015-11-20 NOTE — Progress Notes (Signed)
[place] TEAM 1 - Stepdown/ICU TEAM PROGRESS NOTE  LLESENIA FOGAL ZOX:[id number] DOB: 07-03-1941 DOA: 11/16/2015 PCP: Astrid Divine, MD  Admit HPI / Brief Narrative: 75 y.o. F Hx Anxiety, COPD on 2 L O2, Pulmonary HTN, HTN, Chronic Diastolic CHF, PVD, CAD, GERD, and HLD who presented with Sepsis due to UTI/Pyelo.  Over the course of the first few hours of patient's admission she rapidly deteriorated from a respiratory standpoint w/ O2 saturations dropping into the low 80% on 3-4 L nasal cannula. ABG noted pH 7.32, PCO2 66, PO2 80, bicarbonate 33%. Patient was placed on BiPAP.   HPI/Subjective: The pt c/o feeling weak all over.  She reports a poor appetite.  She denies cp, sob at rest, n/v, or abdom pain.    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 we must consider it a potential pathogen - Vancomycin stopped 4/26 - will need repeat blood cultures for f/u after completes current ax course - if fevers recur or sx of infection return, may need further eval   Hypertension  -BP stable at this time   AoV Vegetation -saw Dr Orvan Falconer 10/15/15 - he recommended continued observations off of antibiotics at that time - see discussion above   Rheumatic Mitral Valve Stenosis -Per Cardiology 10/27/2015 treating MV medically for now - high risk for surgery with severe COPD  Small PFO  Chronic Diastolic CHF / Pulmonary Hypertension/ R Heart failure  Dry weight ~178# (80.7kg), but usual weight appears to be closer to 83kg -  Appears to be relatively volume balanced at this time - hold IV lasix and follow intake - net negative ~6.5L since admit   Filed Weights   11/18/15 0415 11/19/15 0500 11/20/15 0500  Weight: 86.6 kg (190 lb 14.7 oz) 84.3 kg (185  lb 13.6 oz) 84.2 kg (185 lb 10 oz)   CAD - history of CABG -asymptomatic at present   Parox Atrial Fibrillation -presently in NSR -Continue Xarelto  Hyperlipidemia Continue home statins  Hypokalemia Follow K+ w/ diuresis - she has a hx of profound hypokalemia   Recent Labs Lab 11/16/15 1922 11/17/15 0411 11/17/15 1043 11/18/15 0458 11/19/15 0404  K 3.3* 3.2* 3.9 4.9 4.0     Acute Kidney Injury - resolved  Improving - follow w/ diuresis   Recent Labs Lab 11/16/15 1303 11/16/15 1922 11/17/15 0411 11/18/15 0458 11/19/15 0404  CREATININE 1.27* 1.15* 1.37* 1.03* 0.86   Chronic hypoxic resp failure due to COPD w/o acute exacerbation  On 2LNC at home - sats currently stable   Deconditioning -PT/OT   Code Status: FULL Family Communication: No family present at time of exam today  Disposition Plan: stable for transfer to tele bed - PT/OT - suspect SNF needed, but pt presently reluctant to consider   Consultants: none  Procedures: none  Antibiotics: Aztreonam 4/24  Ceftriaxone 4/25 > Vancomycin 4/24 > 4/26  DVT prophylaxis: Xarelto   Objective: Blood pressure 119/63, pulse 86, temperature 97.9 F (36.6 C), temperature source Oral, resp. rate 25, height 5\' 5"  (1.651 m), weight 84.2 kg (185 lb 10 oz), SpO2 93 %.  Intake/Output Summary (Last 24 hours) at 11/20/15 1618 Last data filed at 11/20/15 1200  Gross per 24 hour  Intake    240  ml  Output   3550 ml  Net  -3310 ml   Exam: General: no acute resp distress - alert and calm  Lungs: Clear to auscultation bilaterally without wheezes or crackles Cardiovascular: Regular rate and rhythm without gallop - 3/6 SEM Abdomen: Nontender, nondistended, soft, bowel sounds positive, no rebound, no ascites, no appreciable mass Extremities: No significant cyanosis, or clubbing;  Trace edema bilateral lower extremities  Data Reviewed:  Basic Metabolic Panel:  Recent Labs Lab 11/16/15 1303 11/16/15 1922  11/17/15 0411 11/17/15 1043 11/18/15 0458 11/19/15 0404  NA 139 141 140  --  141 143  K 3.7 3.3* 3.2* 3.9 4.9 4.0  CL 91* 95* 96*  --  101 96*  CO2 38* 33* 32  --  32 37*  GLUCOSE 128* 151* 136*  --  128* 146*  BUN 15 14 17   --  22* 19  CREATININE 1.27* 1.15* 1.37*  --  1.03* 0.86  CALCIUM 9.6 8.8* 8.7*  --  8.8* 9.0  MG  --   --   --  1.7 1.8 1.9    CBC:  Recent Labs Lab 11/16/15 1303 11/16/15 1922 11/17/15 1043 11/18/15 0458 11/19/15 0404  WBC 17.7* 9.2 23.5* 17.4* 13.2*  NEUTROABS 14.4* 8.7* 19.7* 15.0* 11.3*  HGB 9.9* 9.5* 8.4* 8.1* 8.3*  HCT 33.2* 33.5* 29.4* 28.4* 29.4*  MCV 85.3 86.6 86.0 87.4 87.5  PLT 219 191 178 164 192    Liver Function Tests:  Recent Labs Lab 11/16/15 1303 11/16/15 1922 11/18/15 0458 11/19/15 0404  AST 17 21 32 47*  ALT 11* 15 16 30   ALKPHOS 71 79 61 84  BILITOT 0.6 1.2 0.5 0.5  PROT 7.1 6.7 6.2* 7.0  ALBUMIN 3.2* 2.9* 2.4* 2.6*    Coags:  Recent Labs Lab 11/16/15 1922  INR 1.54*    Recent Labs Lab 11/16/15 1922  APTT 29    Recent Results (from the past 240 hour(s))  Culture, blood (Routine x 2)     Status: Abnormal   Collection Time: 11/16/15  1:03 PM  Result Value Ref Range Status   Specimen Description BLOOD RIGHT HAND  Final   Special Requests BOTTLES DRAWN AEROBIC AND ANAEROBIC 5CC  Final   Culture  Setup Time   Final    GRAM NEGATIVE RODS ANAEROBIC BOTTLE ONLY CRITICAL RESULT CALLED TO, READ BACK BY AND VERIFIED WITH: IRBY,RN @0506  11/17/15 MKELLY    Culture ESCHERICHIA COLI (A)  Final   Report Status 11/19/2015 FINAL  Final   Organism ID, Bacteria ESCHERICHIA COLI  Final      Susceptibility   Escherichia coli - MIC*    AMPICILLIN >=32 RESISTANT Resistant     CEFAZOLIN <=4 SENSITIVE Sensitive     CEFEPIME <=1 SENSITIVE Sensitive     CEFTAZIDIME <=1 SENSITIVE Sensitive     CEFTRIAXONE <=1 SENSITIVE Sensitive     CIPROFLOXACIN <=0.25 SENSITIVE Sensitive     GENTAMICIN <=1 SENSITIVE Sensitive      IMIPENEM <=0.25 SENSITIVE Sensitive     TRIMETH/SULFA <=20 SENSITIVE Sensitive     AMPICILLIN/SULBACTAM >=32 RESISTANT Resistant     PIP/TAZO 64 INTERMEDIATE Intermediate     * ESCHERICHIA COLI  Culture, blood (Routine x 2)     Status: Abnormal   Collection Time: 11/16/15  1:05 PM  Result Value Ref Range Status   Specimen Description BLOOD WRIST RIGHT  Final   Special Requests BOTTLES DRAWN AEROBIC AND ANAEROBIC 5CC  Final   Culture  Setup Time  Final    GRAM POSITIVE COCCI IN CLUSTERS AEROBIC BOTTLE ONLY CRITICAL RESULT CALLED TO, READ BACK BY AND VERIFIED WITH: Jenetta Downer RN 1610 11/17/15 A BROWNING    Culture (A)  Final    STAPHYLOCOCCUS SPECIES (COAGULASE NEGATIVE) THE SIGNIFICANCE OF ISOLATING THIS ORGANISM FROM A SINGLE SET OF BLOOD CULTURES WHEN MULTIPLE SETS ARE DRAWN IS UNCERTAIN. PLEASE NOTIFY THE MICROBIOLOGY DEPARTMENT WITHIN ONE WEEK IF SPECIATION AND SENSITIVITIES ARE REQUIRED.    Report Status 11/19/2015 FINAL  Final  Urine culture     Status: Abnormal   Collection Time: 11/16/15  2:11 PM  Result Value Ref Range Status   Specimen Description URINE, CLEAN CATCH  Final   Special Requests NONE  Final   Culture >=100,000 COLONIES/mL ESCHERICHIA COLI (A)  Final   Report Status 11/18/2015 FINAL  Final   Organism ID, Bacteria ESCHERICHIA COLI (A)  Final      Susceptibility   Escherichia coli - MIC*    AMPICILLIN >=32 RESISTANT Resistant     CEFAZOLIN <=4 SENSITIVE Sensitive     CEFTRIAXONE <=1 SENSITIVE Sensitive     CIPROFLOXACIN <=0.25 SENSITIVE Sensitive     GENTAMICIN <=1 SENSITIVE Sensitive     IMIPENEM <=0.25 SENSITIVE Sensitive     NITROFURANTOIN <=16 SENSITIVE Sensitive     TRIMETH/SULFA <=20 SENSITIVE Sensitive     AMPICILLIN/SULBACTAM >=32 RESISTANT Resistant     PIP/TAZO 64 INTERMEDIATE Intermediate     * >=100,000 COLONIES/mL ESCHERICHIA COLI  MRSA PCR Screening     Status: None   Collection Time: 11/16/15  4:18 PM  Result Value Ref Range Status   MRSA by  PCR NEGATIVE NEGATIVE Final    Comment:        The GeneXpert MRSA Assay (FDA approved for NASAL specimens only), is one component of a comprehensive MRSA colonization surveillance program. It is not intended to diagnose MRSA infection nor to guide or monitor treatment for MRSA infections.   Culture, blood (x 2)     Status: None (Preliminary result)   Collection Time: 11/16/15  7:16 PM  Result Value Ref Range Status   Specimen Description BLOOD LEFT HAND  Final   Special Requests IN PEDIATRIC BOTTLE 1CC   Final   Culture NO GROWTH 4 DAYS  Final   Report Status PENDING  Incomplete  Culture, blood (x 2)     Status: None (Preliminary result)   Collection Time: 11/16/15  7:22 PM  Result Value Ref Range Status   Specimen Description BLOOD RIGHT ANTECUBITAL  Final   Special Requests IN PEDIATRIC BOTTLE 1CC   Final   Culture NO GROWTH 4 DAYS  Final   Report Status PENDING  Incomplete     Studies:   Recent x-ray studies have been reviewed in detail by the Attending Physician  Scheduled Meds:  Scheduled Meds: . cefTRIAXone (ROCEPHIN)  IV  2 g Intravenous Q24H  . furosemide  80 mg Intravenous Q12H  . gabapentin  300 mg Oral BID  . metoprolol  50 mg Oral BID  . potassium chloride SA  40 mEq Oral BID  . rivaroxaban  20 mg Oral QHS  . sertraline  100 mg Oral QHS  . simvastatin  40 mg Oral q1800  . spironolactone  25 mg Oral Daily    Time spent on care of this patient: 35 mins   Saraiyah Hemminger T , MD   [facility] Office  [phone] Pager - Text Page per Loretha Stapler as per below:  On-Call/Text Page:  [email or web address]      password [email or web address]  If 7PM-7AM, please contact night-coverage [email or web address] Password [email or web address] 11/20/2015, 4:18 PM   LOS: 4 days

## 2015-11-20 NOTE — Evaluation (Signed)
Occupational Therapy Evaluation Patient Details Name: ALIYANAH Lloyd MRN: [id number] DOB: April 28, 1941 Today's Date: 11/20/2015    History of Present Illness [name] is a 75 y.o. 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 Placed on Bipap in ED found to have Sepsis due to Escherichia coli UTI / Pyelonephritis w/ bacteremia    Clinical Impression   This 75 yo female admitted with above presents to acute OT with deficits below (see OT problem list) affecting her PLOF of Mod I with basic ADLs (took increased time since pt just D/C'd from hospital a month ago) and was able to go to grocery store with son and push grocery cart. She will benefit from acute OT with follow up OT at SNF to get to a Mod I to independent level to get back home with family.    Follow Up Recommendations  SNF          Precautions / Restrictions Precautions Precautions: Fall Precaution Comments: on O2 at home 2 liters Restrictions Weight Bearing Restrictions: No      Mobility Bed Mobility Overal bed mobility: Needs Assistance Bed Mobility: Sit to Supine       Sit to supine: Min guard   General bed mobility comments: No rail and HOB flat  Transfers Overall transfer level: Needs assistance Equipment used: Rolling walker (2 wheeled) Transfers: Sit to/from Stand Sit to Stand: Min assist (with increased time)         General transfer comment: Pt min A for ambulation 25 feet with RW    Balance Overall balance assessment: Needs assistance Sitting-balance support: No upper extremity supported;Feet supported Sitting balance-Leahy Scale: Fair     Standing  balance support: Bilateral upper extremity supported Standing balance-Leahy Scale: Poor Standing balance comment: reliant on RW                            ADL Overall ADL's : Needs assistance/impaired Eating/Feeding: Independent;Sitting   Grooming: Set up;Sitting   Upper Body Bathing: Set up;Sitting   Lower Body Bathing: Moderate assistance (min A sit<>stand)   Upper Body Dressing : Set up;Sitting   Lower Body Dressing: Moderate assistance (min A sit<>stand)   Toilet Transfer: Minimal assistance;Ambulation;RW;BSC   Toileting- Clothing Manipulation and Hygiene: Minimal assistance;Sit to/from stand         General ADL Comments: Educated pt on purse lipped breathing (needed VCs throughout session)               Pertinent Vitals/Pain Pain Assessment:  (catheter irritating)     Hand Dominance Right   Extremity/Trunk Assessment Upper Extremity Assessment Upper Extremity Assessment: Generalized weakness   Lower Extremity Assessment Lower Extremity Assessment: Defer to PT evaluation       Communication Communication Communication: No difficulties   Cognition Arousal/Alertness: Awake/alert Behavior During Therapy: WFL for tasks assessed/performed Overall Cognitive Status: Within Functional Limits for tasks assessed                                Home Living Family/patient expects to be discharged to:: Skilled nursing  facility Living Arrangements: Spouse/significant other;Children Available Help at Discharge: Family;Available PRN/intermittently Type of Home: House Home Access: Ramped entrance     Home Layout: One level     Bathroom Shower/Tub: Producer, television/film/video: Standard     Home Equipment: Environmental consultant - 4 wheels;Shower seat;Hand held shower head;Bedside commode   Additional Comments: Pt lives at home with her spouse who is w/c bound due to BLE amputations--no prothetics and uses transfer board for transfers. Pt's son  lives with them but works during the day. Pt states that her husband is independent with his power w/c and is able to get her things so she does not have to get up often.       Prior Functioning/Environment Level of Independence: Independent with assistive device(s)        Comments: On home O2 2L, uses Rollator RW; lately very inactve (just D/C'd s month ago from hospital--said she felt like she was 75% of baseline when she D/C'd),     OT Diagnosis: Generalized weakness   OT Problem List: Decreased strength;Decreased activity tolerance;Impaired balance (sitting and/or standing);Cardiopulmonary status limiting activity;Decreased knowledge of use of DME or AE   OT Treatment/Interventions: Self-care/ADL training;Patient/family education;Balance training;Therapeutic activities;DME and/or AE instruction;Energy conservation    OT Goals(Current goals can be found in the care plan section) Acute Rehab OT Goals Patient Stated Goal: maybe to rehab then home OT Goal Formulation: With patient Time For Goal Achievement: 12/04/15 Potential to Achieve Goals: Good  OT Frequency: Min 2X/week   Barriers to D/C: Decreased caregiver support             End of Session Equipment Utilized During Treatment: Gait belt;Rolling walker Nurse Communication:  (One of pt's IV's fell out when she stood up)  Activity Tolerance: Patient limited by fatigue Patient left: in bed;with call bell/phone within reach;with chair alarm set   Time: 1400-1452 OT Time Calculation (min): 52 min Charges:  OT General Charges $OT Visit: 1 Procedure OT Evaluation $OT Eval Moderate Complexity: 1 Procedure OT Treatments $Self Care/Home Management : 23-37 mins  Evette Georges [phone] 11/20/2015, 3:24 PM

## 2015-11-20 NOTE — Care Management Note (Signed)
Case Management Note  Patient Details  Name: [name] MRN: [id number] Date of Birth: 1941-07-18  Subjective/Objective:       Patient presents with sepsis fever and weakness, from home with spouse who is in a w/chair and her son who works from 6 am - 3 pm.  Patient has home oxygen with [employer] (2liters) continously.  She is active with [facility] and would like to continue with them.  Await pt/ot eval.  NCM will cont to follow for dc needs.             Action/Plan:   Expected Discharge Date:                  Expected Discharge Plan:  Home w Home Health Services  In-House Referral:     Discharge planning Services  CM Consult  Post Acute Care Choice:  Resumption of Svcs/PTA Provider Choice offered to:     DME Arranged:    DME Agency:     HH Arranged:  RN HH Agency:  [facility]  Status of Service:  In process, will continue to follow  Medicare Important Message Given:  Yes Date Medicare IM Given:    Medicare IM give by:    Date Additional Medicare IM Given:    Additional Medicare Important Message give by:     If discussed at Long Length of Stay Meetings, dates discussed:    Additional Comments:  Leone Haven, RN 11/20/2015, 1:33 PM

## 2015-11-21 LAB — BASIC METABOLIC PANEL
Anion gap: 10 (ref 5–15)
BUN: 16 mg/dL (ref 6–20)
CHLORIDE: 94 mmol/L — AB (ref 101–111)
CO2: 40 mmol/L — AB (ref 22–32)
Calcium: 9.3 mg/dL (ref 8.9–10.3)
Creatinine, Ser: 0.8 mg/dL (ref 0.44–1.00)
GFR calc Af Amer: >60 mL/min (ref >60)
GLUCOSE: 103 mg/dL — AB (ref 65–99)
POTASSIUM: 4 mmol/L (ref 3.5–5.1)
Sodium: 144 mmol/L (ref 135–145)

## 2015-11-21 LAB — CULTURE, BLOOD (ROUTINE X 2)
CULTURE: NO GROWTH
Culture: NO GROWTH

## 2015-11-21 NOTE — Discharge Instructions (Signed)

## 2015-11-21 NOTE — Evaluation (Signed)
Physical Therapy Evaluation Patient Details Name: [name] MRN: [id number] DOB: Apr 29, 1941 Today's Date: 11/21/2015   History of Present Illness  [name] is a 75 y.o. 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 Placed on Bipap in ED found to have Sepsis due to Escherichia coli UTI / Pyelonephritis w/ bacteremia     Clinical Impression  Pt admitted with above diagnosis. Pt currently with functional limitations due to the deficits listed below (see PT Problem List). [name] presents w/ generalized weakness and SOB.  She currently requires min guard assist for safety w/ transfers and ambulation.  Her husband is unable to provide the physical assist necessary for pt to return home, recommending ST SNF. Pt will benefit from skilled PT to increase their independence and safety with mobility to allow discharge to the venue listed below.      Follow Up Recommendations SNF;Supervision for mobility/OOB    Equipment Recommendations  None recommended by PT    Recommendations for Other Services       Precautions / Restrictions Precautions Precautions: Fall Precaution Comments: on 2 liters O2 at home Restrictions Weight Bearing Restrictions: No      Mobility  Bed Mobility Overal bed mobility: Needs Assistance Bed Mobility: Sit to Supine       Sit to supine: Min guard;HOB elevated   General bed mobility comments: HOB elevated, increased time and effort w/ SOB.  Transfers Overall transfer level: Needs assistance Equipment used: Rolling walker (2 wheeled) Transfers: Sit to/from Stand Sit to Stand: Min guard         General  transfer comment: Slow to stand w/ increased effort.  Good technique w/ hand placement.  Cues for upright posture once standing.  Ambulation/Gait Ambulation/Gait assistance: Min guard Ambulation Distance (Feet): 80 Feet Assistive device: Rolling walker (2 wheeled) Gait Pattern/deviations: Step-through pattern;Decreased stride length;Trunk flexed   Gait velocity interpretation: Below normal speed for age/gender General Gait Details: Pulse ox unable to read O2 level, pt w/ cold fingers.  Remained on 2L O2 and cues for upright posture and pursed lip breathing.  Stairs            Wheelchair Mobility    Modified Rankin (Stroke Patients Only)       Balance Overall balance assessment: Needs assistance Sitting-balance support: No upper extremity supported;Feet supported Sitting balance-Leahy Scale: Good     Standing balance support: During functional activity;Bilateral upper extremity supported Standing balance-Leahy Scale: Poor Standing balance comment: Support from RW                             Pertinent Vitals/Pain Pain Assessment: No/denies pain    Home Living Family/patient expects to be discharged to:: Skilled nursing facility Living Arrangements: Spouse/significant other;Children Available Help at Discharge: Family;Available PRN/intermittently Type of Home: House Home Access: Ramped entrance     Home Layout: One level Home Equipment: Walker - 4 wheels;Shower seat;Hand held shower head;Bedside commode Additional Comments: Pt lives at home with her spouse who is w/c bound due to BLE amputations--no prothetics and uses transfer board for transfers. Pt's son lives with them but works during  the day. Pt states that her husband is independent with his power w/c and is able to get her things so she does not have to get up often.     Prior Function Level of Independence: Independent with assistive device(s)         Comments: On home O2 2L, uses Rollator  RW; lately very inactve (just D/C'd s month ago from hospital--said she felt like she was 75% of baseline when she D/C'd),      Hand Dominance   Dominant Hand: Right    Extremity/Trunk Assessment   Upper Extremity Assessment: Defer to OT evaluation           Lower Extremity Assessment: Generalized weakness      Cervical / Trunk Assessment: Kyphotic  Communication   Communication: No difficulties  Cognition Arousal/Alertness: Awake/alert Behavior During Therapy: WFL for tasks assessed/performed Overall Cognitive Status: Within Functional Limits for tasks assessed                      General Comments      Exercises        Assessment/Plan    PT Assessment Patient needs continued PT services  PT Diagnosis Difficulty walking;Generalized weakness   PT Problem List Decreased strength;Decreased activity tolerance;Decreased balance;Decreased mobility;Decreased safety awareness;Cardiopulmonary status limiting activity  PT Treatment Interventions DME instruction;Gait training;Functional mobility training;Therapeutic activities;Therapeutic exercise;Balance training;Patient/family education   PT Goals (Current goals can be found in the Care Plan section) Acute Rehab PT Goals Patient Stated Goal: maybe to rehab then home PT Goal Formulation: With patient/family Time For Goal Achievement: 12/05/15 Potential to Achieve Goals: Good    Frequency Min 3X/week   Barriers to discharge Decreased caregiver support Husband unable to provide physical assist at home    Co-evaluation               End of Session Equipment Utilized During Treatment: Gait belt;Oxygen Activity Tolerance: Patient tolerated treatment well;Patient limited by fatigue (SOB w/ bed mobility) Patient left: in bed;with call bell/phone within reach;with bed alarm set;with family/visitor present Nurse Communication: Mobility status;Other (comment) (unable to get SpO2 reading)         Time:  1540-1602 PT Time Calculation (min) (ACUTE ONLY): 22 min   Charges:   PT Evaluation $PT Eval Moderate Complexity: 1 Procedure     PT G Codes:       Encarnacion Chu PT, DPT  Pager: [phone] Phone: [phone] 11/21/2015, 4:18 PM

## 2015-11-21 NOTE — Progress Notes (Signed)
[place] TEAM 8 PROGRESS NOTE  [name] RFX:[id number] DOB: 1941-05-26 DOA: 11/16/2015 PCP: Astrid Divine, MD  Admit HPI / Brief Narrative: 75 y.o. F Hx Anxiety, COPD on 2 L O2, Pulmonary HTN, HTN, Chronic Diastolic CHF, PVD, CAD, GERD, and HLD who presented with Sepsis due to UTI/Pyelo.  Over the course of the first few hours of patient's admission she rapidly deteriorated from a respiratory standpoint w/ O2 saturations dropping into the low 80% on 3-4 L nasal cannula. ABG noted pH 7.32, PCO2 66, PO2 80, bicarbonate 33%. Patient was placed on BiPAP.   HPI/Subjective: The pt still complaining of feeling week.  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 we must consider it a potential pathogen - Vancomycin stopped 4/26 - will need repeat blood cultures for f/u after completes current ax course - if fevers recur or sx of infection return, may need further eval   Hypertension  -BP stable at this time   AoV Vegetation -saw Dr Orvan Falconer 10/15/15 - he recommended continued observations off of antibiotics at that time - see discussion above   Rheumatic Mitral Valve Stenosis -Per Cardiology treating medically  Small PFO  Chronic Diastolic CHF / Pulmonary Hypertension/ R Heart failure  Dry weight ~178# (80.7kg), but usual weight appears to be closer to 83kg -  Appears to be relatively volume balanced at this time - hold IV lasix and follow intake - net negative ~6.5L since admit   Filed Weights   11/19/15 0500 11/20/15 0500 11/21/15 0751  Weight: 84.3 kg (185 lb 13.6 oz) 84.2 kg (185 lb 10 oz) 82.2 kg (181 lb 3.5 oz)   CAD - history of CABG -asymptomatic at present   Parox Atrial Fibrillation -presently in NSR -Continue  Xarelto  Hyperlipidemia Continue home statins  Hypokalemia Has resolved.   Recent Labs Lab 11/17/15 0411 11/17/15 1043 11/18/15 0458 11/19/15 0404 11/21/15 0528  K 3.2* 3.9 4.9 4.0 4.0     Acute Kidney Injury - resolved  Improving - follow w/ diuresis   Recent Labs Lab 11/16/15 1922 11/17/15 0411 11/18/15 0458 11/19/15 0404 11/21/15 0528  CREATININE 1.15* 1.37* 1.03* 0.86 0.80   Chronic hypoxic resp failure due to COPD w/o acute exacerbation  On 2LNC at home - sats currently stable   Deconditioning -PT/OT   Code Status: FULL Family Communication: No family present at time of exam today  Disposition Plan: pending further recommendations from specialist  Consultants: none  Procedures: none  Antibiotics: Aztreonam 4/24  Ceftriaxone 4/25 > Vancomycin 4/24 > 4/26  DVT prophylaxis: Xarelto   Objective: Blood pressure 135/44, pulse 84, temperature 98.3 F (36.8 C), temperature source Oral, resp. rate 20, height 5\' 5"  (1.651 m), weight 82.2 kg (181 lb 3.5 oz), SpO2 95 %.  Intake/Output Summary (Last 24 hours) at 11/21/15 1545 Last data filed at 11/21/15 1513  Gross per 24 hour  Intake    960 ml  Output    751 ml  Net    209 ml   Exam: General: no acute resp distress - alert and calm  Lungs: Clear to auscultation bilaterally without wheezes or crackles Cardiovascular: Regular rate and rhythm without gallop - 3/6 SEM Abdomen: Nontender, nondistended, soft, bowel sounds positive, no rebound, no ascites,  no appreciable mass Extremities: No significant cyanosis, or clubbing;  Trace edema bilateral lower extremities  Data Reviewed:  Basic Metabolic Panel:  Recent Labs Lab 11/16/15 1922 11/17/15 0411 11/17/15 1043 11/18/15 0458 11/19/15 0404 11/21/15 0528  NA 141 140  --  141 143 144  K 3.3* 3.2* 3.9 4.9 4.0 4.0  CL 95* 96*  --  101 96* 94*  CO2 33* 32  --  32 37* 40*  GLUCOSE 151* 136*  --  128* 146* 103*  BUN 14 17  --  22* 19 16   CREATININE 1.15* 1.37*  --  1.03* 0.86 0.80  CALCIUM 8.8* 8.7*  --  8.8* 9.0 9.3  MG  --   --  1.7 1.8 1.9  --     CBC:  Recent Labs Lab 11/16/15 1303 11/16/15 1922 11/17/15 1043 11/18/15 0458 11/19/15 0404  WBC 17.7* 9.2 23.5* 17.4* 13.2*  NEUTROABS 14.4* 8.7* 19.7* 15.0* 11.3*  HGB 9.9* 9.5* 8.4* 8.1* 8.3*  HCT 33.2* 33.5* 29.4* 28.4* 29.4*  MCV 85.3 86.6 86.0 87.4 87.5  PLT 219 191 178 164 192    Liver Function Tests:  Recent Labs Lab 11/16/15 1303 11/16/15 1922 11/18/15 0458 11/19/15 0404  AST 17 21 32 47*  ALT 11* 15 16 30   ALKPHOS 71 79 61 84  BILITOT 0.6 1.2 0.5 0.5  PROT 7.1 6.7 6.2* 7.0  ALBUMIN 3.2* 2.9* 2.4* 2.6*    Coags:  Recent Labs Lab 11/16/15 1922  INR 1.54*    Recent Labs Lab 11/16/15 1922  APTT 29    Recent Results (from the past 240 hour(s))  Culture, blood (Routine x 2)     Status: Abnormal   Collection Time: 11/16/15  1:03 PM  Result Value Ref Range Status   Specimen Description BLOOD RIGHT HAND  Final   Special Requests BOTTLES DRAWN AEROBIC AND ANAEROBIC 5CC  Final   Culture  Setup Time   Final    GRAM NEGATIVE RODS ANAEROBIC BOTTLE ONLY CRITICAL RESULT CALLED TO, READ BACK BY AND VERIFIED WITH: IRBY,RN @0506  11/17/15 MKELLY    Culture ESCHERICHIA COLI (A)  Final   Report Status 11/19/2015 FINAL  Final   Organism ID, Bacteria ESCHERICHIA COLI  Final      Susceptibility   Escherichia coli - MIC*    AMPICILLIN >=32 RESISTANT Resistant     CEFAZOLIN <=4 SENSITIVE Sensitive     CEFEPIME <=1 SENSITIVE Sensitive     CEFTAZIDIME <=1 SENSITIVE Sensitive     CEFTRIAXONE <=1 SENSITIVE Sensitive     CIPROFLOXACIN <=0.25 SENSITIVE Sensitive     GENTAMICIN <=1 SENSITIVE Sensitive     IMIPENEM <=0.25 SENSITIVE Sensitive     TRIMETH/SULFA <=20 SENSITIVE Sensitive     AMPICILLIN/SULBACTAM >=32 RESISTANT Resistant     PIP/TAZO 64 INTERMEDIATE Intermediate     * ESCHERICHIA COLI  Culture, blood (Routine x 2)     Status: Abnormal    Collection Time: 11/16/15  1:05 PM  Result Value Ref Range Status   Specimen Description BLOOD WRIST RIGHT  Final   Special Requests BOTTLES DRAWN AEROBIC AND ANAEROBIC 5CC  Final   Culture  Setup Time   Final    GRAM POSITIVE COCCI IN CLUSTERS AEROBIC BOTTLE ONLY CRITICAL RESULT CALLED TO, READ BACK BY AND VERIFIED WITH: Jenetta Downer RN 1551 11/17/15 A BROWNING    Culture (A)  Final    STAPHYLOCOCCUS SPECIES (COAGULASE NEGATIVE) THE SIGNIFICANCE OF ISOLATING THIS ORGANISM FROM A SINGLE SET OF  BLOOD CULTURES WHEN MULTIPLE SETS ARE DRAWN IS UNCERTAIN. PLEASE NOTIFY THE MICROBIOLOGY DEPARTMENT WITHIN ONE WEEK IF SPECIATION AND SENSITIVITIES ARE REQUIRED.    Report Status 11/19/2015 FINAL  Final  Urine culture     Status: Abnormal   Collection Time: 11/16/15  2:11 PM  Result Value Ref Range Status   Specimen Description URINE, CLEAN CATCH  Final   Special Requests NONE  Final   Culture >=100,000 COLONIES/mL ESCHERICHIA COLI (A)  Final   Report Status 11/18/2015 FINAL  Final   Organism ID, Bacteria ESCHERICHIA COLI (A)  Final      Susceptibility   Escherichia coli - MIC*    AMPICILLIN >=32 RESISTANT Resistant     CEFAZOLIN <=4 SENSITIVE Sensitive     CEFTRIAXONE <=1 SENSITIVE Sensitive     CIPROFLOXACIN <=0.25 SENSITIVE Sensitive     GENTAMICIN <=1 SENSITIVE Sensitive     IMIPENEM <=0.25 SENSITIVE Sensitive     NITROFURANTOIN <=16 SENSITIVE Sensitive     TRIMETH/SULFA <=20 SENSITIVE Sensitive     AMPICILLIN/SULBACTAM >=32 RESISTANT Resistant     PIP/TAZO 64 INTERMEDIATE Intermediate     * >=100,000 COLONIES/mL ESCHERICHIA COLI  MRSA PCR Screening     Status: None   Collection Time: 11/16/15  4:18 PM  Result Value Ref Range Status   MRSA by PCR NEGATIVE NEGATIVE Final    Comment:        The GeneXpert MRSA Assay (FDA approved for NASAL specimens only), is one component of a comprehensive MRSA colonization surveillance program. It is not intended to diagnose MRSA infection nor to  guide or monitor treatment for MRSA infections.   Culture, blood (x 2)     Status: None   Collection Time: 11/16/15  7:16 PM  Result Value Ref Range Status   Specimen Description BLOOD LEFT HAND  Final   Special Requests IN PEDIATRIC BOTTLE 1CC   Final   Culture NO GROWTH 5 DAYS  Final   Report Status 11/21/2015 FINAL  Final  Culture, blood (x 2)     Status: None   Collection Time: 11/16/15  7:22 PM  Result Value Ref Range Status   Specimen Description BLOOD RIGHT ANTECUBITAL  Final   Special Requests IN PEDIATRIC BOTTLE 1CC   Final   Culture NO GROWTH 5 DAYS  Final   Report Status 11/21/2015 FINAL  Final     Studies:   Recent x-ray studies have been reviewed in detail by the Attending Physician  Scheduled Meds:  Scheduled Meds: . cefTRIAXone (ROCEPHIN)  IV  2 g Intravenous Q24H  . gabapentin  300 mg Oral BID  . metoprolol  50 mg Oral BID  . potassium chloride SA  40 mEq Oral BID  . rivaroxaban  20 mg Oral QHS  . sertraline  100 mg Oral QHS  . simvastatin  40 mg Oral q1800  . spironolactone  25 mg Oral Daily  . torsemide  40 mg Oral BID    Time spent on care of this patient: 35 mins   Penny Pia , MD   [facility] Office  [phone] Pager - Text Page per Loretha Stapler as per below:  On-Call/Text Page:      [email or web address]      password [email or web address]  If 7PM-7AM, please contact night-coverage [email or web address] Password [email or web address] 11/21/2015, 3:45 PM   LOS: 5 days

## 2015-11-22 NOTE — Progress Notes (Signed)
[place] TEAM 8 PROGRESS NOTE  [name] TMA:[id number] DOB: 1940/12/03 DOA: 11/16/2015 PCP: Astrid Divine, MD  Admit HPI / Brief Narrative: 75 y.o. F Hx Anxiety, COPD on 2 L O2, Pulmonary HTN, HTN, Chronic Diastolic CHF, PVD, CAD, GERD, and HLD who presented with Sepsis due to UTI/Pyelo.  Over the course of the first few hours of patient's admission she rapidly deteriorated from a respiratory standpoint w/ O2 saturations dropping into the low 80% on 3-4 L nasal cannula. ABG noted pH 7.32, PCO2 66, PO2 80, bicarbonate 33%. Patient was placed on BiPAP.   HPI/Subjective: The pt still complaining of feeling week.  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 we must consider it a potential pathogen - Vancomycin stopped 4/26 - will need repeat blood cultures for f/u after completes current ax course - if fevers recur or sx of infection return, may need further eval   Hypertension  -BP stable at this time   AoV Vegetation -saw Dr Orvan Falconer 10/15/15 - he recommended continued observations off of antibiotics at that time - see discussion above   Rheumatic Mitral Valve Stenosis -Per Cardiology treating medically  Small PFO  Chronic Diastolic CHF / Pulmonary Hypertension/ R Heart failure  Dry weight ~178# (80.7kg), but usual weight appears to be closer to 83kg -  Appears to be relatively volume balanced at this time - hold IV lasix and follow intake - net negative ~6.5L since admit   [facility] Weights   11/20/15 0500 11/21/15 0751 11/22/15 0652  Weight: 84.2 kg (185 lb 10 oz) 82.2 kg (181 lb 3.5 oz) 80.786 kg (178 lb 1.6 oz)   CAD - history of CABG -asymptomatic at present   Parox Atrial  Fibrillation -presently in NSR -Continue Xarelto  Hyperlipidemia Continue home statins  Hypokalemia Has resolved.   Recent Labs Lab 11/17/15 0411 11/17/15 1043 11/18/15 0458 11/19/15 0404 11/21/15 0528  K 3.2* 3.9 4.9 4.0 4.0     Acute Kidney Injury - resolved  Improving - follow w/ diuresis   Recent Labs Lab 11/16/15 1922 11/17/15 0411 11/18/15 0458 11/19/15 0404 11/21/15 0528  CREATININE 1.15* 1.37* 1.03* 0.86 0.80   Chronic hypoxic resp failure due to COPD w/o acute exacerbation  On 2LNC at home - sats currently stable   Deconditioning -PT/OT   Code Status: FULL Family Communication: No family present at time of exam today  Disposition Plan: pending further recommendations from specialist  Consultants: none  Procedures: none  Antibiotics: Aztreonam 4/24  Ceftriaxone 4/25 > Vancomycin 4/24 > 4/26  DVT prophylaxis: Xarelto   Objective: Blood pressure 105/52, pulse 97, temperature 97.3 F (36.3 C), temperature source Oral, resp. rate 20, height 5\' 5"  (1.651 m), weight 80.786 kg (178 lb 1.6 oz), SpO2 96 %.  Intake/Output Summary (Last 24 hours) at 11/22/15 1501 Last data filed at 11/22/15 1424  Gross per 24 hour  Intake    840 ml  Output    176 ml  Net    664 ml   Exam: General: no acute resp distress - alert and calm  Lungs: Clear to auscultation bilaterally without wheezes or crackles Cardiovascular: Regular rate and rhythm without gallop - 3/6 SEM Abdomen: Nontender, nondistended,  soft, bowel sounds positive, no rebound, no ascites, no appreciable mass Extremities: No significant cyanosis, or clubbing;  Trace edema bilateral lower extremities  Data Reviewed:  Basic Metabolic Panel:  Recent Labs Lab 11/16/15 1922 11/17/15 0411 11/17/15 1043 11/18/15 0458 11/19/15 0404 11/21/15 0528  NA 141 140  --  141 143 144  K 3.3* 3.2* 3.9 4.9 4.0 4.0  CL 95* 96*  --  101 96* 94*  CO2 33* 32  --  32 37* 40*  GLUCOSE 151* 136*  --  128*  146* 103*  BUN 14 17  --  22* 19 16  CREATININE 1.15* 1.37*  --  1.03* 0.86 0.80  CALCIUM 8.8* 8.7*  --  8.8* 9.0 9.3  MG  --   --  1.7 1.8 1.9  --     CBC:  Recent Labs Lab 11/16/15 1303 11/16/15 1922 11/17/15 1043 11/18/15 0458 11/19/15 0404  WBC 17.7* 9.2 23.5* 17.4* 13.2*  NEUTROABS 14.4* 8.7* 19.7* 15.0* 11.3*  HGB 9.9* 9.5* 8.4* 8.1* 8.3*  HCT 33.2* 33.5* 29.4* 28.4* 29.4*  MCV 85.3 86.6 86.0 87.4 87.5  PLT 219 191 178 164 192    Liver Function Tests:  Recent Labs Lab 11/16/15 1303 11/16/15 1922 11/18/15 0458 11/19/15 0404  AST 17 21 32 47*  ALT 11* ALKPHOS 71 79 61 84  BILITOT 0.6 1.2 0.5 0.5  PROT 7.1 6.7 6.2* 7.0  ALBUMIN 3.2* 2.9* 2.4* 2.6*    Coags:  Recent Labs Lab 11/16/15 1922  INR 1.54*    Recent Labs Lab 11/16/15 1922  APTT 29    Recent Results (from the past 240 hour(s))  Culture, blood (Routine x 2)     Status: Abnormal   Collection Time: 11/16/15  1:03 PM  Result Value Ref Range Status   Specimen Description BLOOD RIGHT HAND  Final   Special Requests BOTTLES DRAWN AEROBIC AND ANAEROBIC 5CC  Final   Culture  Setup Time   Final    GRAM NEGATIVE RODS ANAEROBIC BOTTLE ONLY CRITICAL RESULT CALLED TO, READ BACK BY AND VERIFIED WITH: IRBY,RN  11/17/15 MKELLY    Culture ESCHERICHIA COLI (A)  Final   Report Status 11/19/2015 FINAL  Final   Organism ID, Bacteria ESCHERICHIA COLI  Final      Susceptibility   Escherichia coli - MIC*    AMPICILLIN >=32 RESISTANT Resistant     CEFAZOLIN <=4 SENSITIVE Sensitive     CEFEPIME <=1 SENSITIVE Sensitive     CEFTAZIDIME <=1 SENSITIVE Sensitive     CEFTRIAXONE <=1 SENSITIVE Sensitive     CIPROFLOXACIN <=0.25 SENSITIVE Sensitive     GENTAMICIN <=1 SENSITIVE Sensitive     IMIPENEM <=0.25 SENSITIVE Sensitive     TRIMETH/SULFA <=20 SENSITIVE Sensitive     AMPICILLIN/SULBACTAM >=32 RESISTANT Resistant     PIP/TAZO 64 INTERMEDIATE Intermediate     * ESCHERICHIA COLI  Culture,  blood (Routine x 2)     Status: Abnormal   Collection Time: 11/16/15  1:05 PM  Result Value Ref Range Status   Specimen Description BLOOD WRIST RIGHT  Final   Special Requests BOTTLES DRAWN AEROBIC AND ANAEROBIC 5CC  Final   Culture  Setup Time   Final    GRAM POSITIVE COCCI IN CLUSTERS AEROBIC BOTTLE ONLY CRITICAL RESULT CALLED TO, READ BACK BY AND VERIFIED WITH: Jenetta Downer RN 1551 11/17/15 A BROWNING    Culture (A)  Final    STAPHYLOCOCCUS SPECIES (COAGULASE NEGATIVE) THE SIGNIFICANCE OF  ISOLATING THIS ORGANISM FROM A SINGLE SET OF BLOOD CULTURES WHEN MULTIPLE SETS ARE DRAWN IS UNCERTAIN. PLEASE NOTIFY THE MICROBIOLOGY DEPARTMENT WITHIN ONE WEEK IF SPECIATION AND SENSITIVITIES ARE REQUIRED.    Report Status 11/19/2015 FINAL  Final  Urine culture     Status: Abnormal   Collection Time: 11/16/15  2:11 PM  Result Value Ref Range Status   Specimen Description URINE, CLEAN CATCH  Final   Special Requests NONE  Final   Culture >=100,000 COLONIES/mL ESCHERICHIA COLI (A)  Final   Report Status 11/18/2015 FINAL  Final   Organism ID, Bacteria ESCHERICHIA COLI (A)  Final      Susceptibility   Escherichia coli - MIC*    AMPICILLIN >=32 RESISTANT Resistant     CEFAZOLIN <=4 SENSITIVE Sensitive     CEFTRIAXONE <=1 SENSITIVE Sensitive     CIPROFLOXACIN <=0.25 SENSITIVE Sensitive     GENTAMICIN <=1 SENSITIVE Sensitive     IMIPENEM <=0.25 SENSITIVE Sensitive     NITROFURANTOIN <=16 SENSITIVE Sensitive     TRIMETH/SULFA <=20 SENSITIVE Sensitive     AMPICILLIN/SULBACTAM >=32 RESISTANT Resistant     PIP/TAZO 64 INTERMEDIATE Intermediate     * >=100,000 COLONIES/mL ESCHERICHIA COLI  MRSA PCR Screening     Status: None   Collection Time: 11/16/15  4:18 PM  Result Value Ref Range Status   MRSA by PCR NEGATIVE NEGATIVE Final    Comment:        The GeneXpert MRSA Assay (FDA approved for NASAL specimens only), is one component of a comprehensive MRSA colonization surveillance program. It is  not intended to diagnose MRSA infection nor to guide or monitor treatment for MRSA infections.   Culture, blood (x 2)     Status: None   Collection Time: 11/16/15  7:16 PM  Result Value Ref Range Status   Specimen Description BLOOD LEFT HAND  Final   Special Requests IN PEDIATRIC BOTTLE 1CC   Final   Culture NO GROWTH 5 DAYS  Final   Report Status 11/21/2015 FINAL  Final  Culture, blood (x 2)     Status: None   Collection Time: 11/16/15  7:22 PM  Result Value Ref Range Status   Specimen Description BLOOD RIGHT ANTECUBITAL  Final   Special Requests IN PEDIATRIC BOTTLE 1CC   Final   Culture NO GROWTH 5 DAYS  Final   Report Status 11/21/2015 FINAL  Final     Studies:   Recent x-ray studies have been reviewed in detail by the Attending Physician  Scheduled Meds:  Scheduled Meds: . cefTRIAXone (ROCEPHIN)  IV  2 g Intravenous Q24H  . gabapentin  300 mg Oral BID  . metoprolol  50 mg Oral BID  . potassium chloride SA  40 mEq Oral BID  . rivaroxaban  20 mg Oral QHS  . sertraline  100 mg Oral QHS  . simvastatin  40 mg Oral q1800  . spironolactone  25 mg Oral Daily  . torsemide  40 mg Oral BID    Time spent on care of this patient: 35 mins   Penny Pia , MD   [facility] Office  [phone] Pager - Text Page per Loretha Stapler as per below:  On-Call/Text Page:      [email or web address]      password [email or web address]  If 7PM-7AM, please contact night-coverage [email or web address] Password [email or web address] 11/22/2015, 3:01 PM   LOS: 6 days

## 2015-11-23 NOTE — Clinical Social Work Note (Signed)
Clinical Social Work Assessment  Patient Details  Name: [name] MRN: [id number] Date of Birth: 10/14/1940  Date of referral:  11/23/15               Reason for consult:  Facility Placement, Discharge Planning                Permission sought to share information with:  Facility Art therapist granted to share information::  Yes, Verbal Permission Granted  Name::     Teryl Lucy (Spouse), Legrand Como (Son)  Agency::  SNF  Relationship::  Spouse, Son  Sport and exercise psychologist Information:  [place]: [phone] Como: [phone]  Housing/Transportation Living arrangements for the past 2 months:  Single Family Home Source of Information:  Patient Patient Interpreter Needed:  None Criminal Activity/Legal Involvement Pertinent to Current Situation/Hospitalization:  No - Comment as needed Significant Relationships:  Adult Children, Spouse Lives with:  Spouse, Adult Children Do you feel safe going back to the place where you live?  Yes Need for family participation in patient care:  Yes (Comment)  Care giving concerns:  PT recommends SNF placement following discharge.   Social Worker assessment / plan:  CSW met with patient. No supports at bedside. CSW introduced role and explained that discharge planning would be discussed. Patient aware of SNF recommendation but does not want to go. Her husband is in a wheelchair due to leg amputations and wants her to be home. Patient reports that other family members want her to be home as well. Her son is at her home at night and she has a Marine scientist from [employer] that comes in during the day to check her vitals. Patient believes that after a few days at home, her mobility will be back to where it was before hospitalization. [facility] is about to end. CSW provided patient with list of SNFs in the area. Patient reports that [facility] in [place] is near her home. Patient gave verbal consent for CSW to fax out to SNFs as a back up to  home care. No further concerns. CSW will continue to follow patient for support and facilitate discharge to SNF if she decides to go to one.  Employment status:  Retired Nurse, adult PT Recommendations:  [place] / Referral to community resources:  [place]  Patient/Family's Response to care:  Patient not agreeable to go to SNF at this time. Prefers to go home due to husband being in a wheelchair. Gave consent to fax out information to local SNF's in the even she does have to go to one.  Patient/Family's Understanding of and Emotional Response to Diagnosis, Current Treatment, and Prognosis:  Patient and her family knowledgeable of medical interventions and aware of SNF recommendation at discharge.  Emotional Assessment Appearance:  Appears stated age Attitude/Demeanor/Rapport:   (Calm, pleasant.) Affect (typically observed):  Calm, Appropriate, Pleasant Orientation:  Oriented to Self, Oriented to Place, Oriented to  Time, Oriented to Situation Alcohol / Substance use:  Never Used Psych involvement (Current and /or in the community):  No (Comment)  Discharge Needs  Concerns to be addressed:  Care Coordination Readmission within the last 30 days:  No Current discharge risk:  Dependent with Mobility Barriers to Discharge:  No Barriers Identified   Candie Chroman, LCSW 11/23/2015, 11:36 AM

## 2015-11-23 NOTE — Progress Notes (Signed)
TEAM 8 PROGRESS NOTE  [name] ZOX:[id number] DOB: [birth year] DOA: 11/16/2015 PCP: Astrid Divine, MD  Admit HPI / Brief Narrative: 75 y.o. F Hx Anxiety, COPD on 2 L O2, Pulmonary HTN, HTN, Chronic Diastolic CHF, PVD, CAD, GERD, and HLD who presented with Sepsis due to UTI/Pyelo.  Over the course of the first few hours of patient's admission she rapidly deteriorated from a respiratory standpoint w/ O2 saturations dropping into the low 80% on 3-4 L nasal cannula. ABG noted pH 7.32, PCO2 66, PO2 80, bicarbonate 33%. Patient was placed on BiPAP.   HPI/Subjective: The pt has no new complaints.  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 we must consider it a potential pathogen - Vancomycin stopped 4/26 - will need repeat blood cultures for f/u after completes current ax course - if fevers recur or sx of infection return, may need further eval   Hypertension  -BP stable at this time   AoV Vegetation -saw Dr Orvan Falconer 10/15/15 - he recommended continued observations off of antibiotics at that time - see discussion above   Rheumatic Mitral Valve Stenosis -Per Cardiology treating medically  Small PFO  Chronic Diastolic CHF / Pulmonary Hypertension/ R Heart failure  Dry weight ~178# (80.7kg), but usual weight appears to be closer to 83kg -  Appears to be relatively volume balanced at this time - hold IV lasix and follow intake - net negative ~6.5L since admit   Filed Weights   11/21/15 0751 11/22/15 0652 11/23/15 0701  Weight: 82.2 kg (181 lb 3.5 oz) 80.786 kg (178 lb 1.6 oz) 80.287 kg (177 lb)   CAD - history of CABG -asymptomatic at present   Parox Atrial  Fibrillation -presently in NSR -Continue Xarelto  Hyperlipidemia Continue home statins  Hypokalemia Has resolved.   Recent Labs Lab 11/17/15 0411 11/17/15 1043 11/18/15 0458 11/19/15 0404 11/21/15 0528  K 3.2* 3.9 4.9 4.0 4.0     Acute Kidney Injury - resolved  Improving - follow w/ diuresis   Recent Labs Lab 11/16/15 1922 11/17/15 0411 11/18/15 0458 11/19/15 0404 11/21/15 0528  CREATININE 1.15* 1.37* 1.03* 0.86 0.80   Chronic hypoxic resp failure due to COPD w/o acute exacerbation  On 2LNC at home - sats currently stable   Deconditioning -PT/OT   Code Status: FULL Family Communication: No family present at time of exam today  Disposition Plan: pending further recommendations from specialist  Consultants: none  Procedures: none  Antibiotics: Aztreonam 4/24  Ceftriaxone 4/25 > Vancomycin 4/24 > 4/26  DVT prophylaxis: Xarelto   Objective: Blood pressure 121/62, pulse 83, temperature 97.6 F (36.4 C), temperature source Oral, resp. rate 18, height 5\' 5"  (1.651 m), weight 80.287 kg (177 lb), SpO2 98 %.  Intake/Output Summary (Last 24 hours) at 11/23/15 1559 Last data filed at 11/23/15 1540  Gross per 24 hour  Intake   1440 ml  Output    300 ml  Net   1140 ml   Exam: General: no acute resp distress - alert and calm  Lungs: Clear to auscultation bilaterally without wheezes or crackles Cardiovascular: Regular rate and rhythm without gallop - 3/6 SEM Abdomen: Nontender, nondistended, soft, bowel sounds  positive, no rebound, no ascites, no appreciable mass Extremities: No significant cyanosis, or clubbing;  Trace edema bilateral lower extremities  Data Reviewed:  Basic Metabolic Panel:  Recent Labs Lab 11/16/15 1922 11/17/15 0411 11/17/15 1043 11/18/15 0458 11/19/15 0404 11/21/15 0528  NA 141 140  --  141 143 144  K 3.3* 3.2* 3.9 4.9 4.0 4.0  CL 95* 96*  --  101 96* 94*  CO2 33* 32  --  32 37* 40*  GLUCOSE 151* 136*  --  128* 146* 103*   BUN 14 17  --  22* 19 16  CREATININE 1.15* 1.37*  --  1.03* 0.86 0.80  CALCIUM 8.8* 8.7*  --  8.8* 9.0 9.3  MG  --   --  1.7 1.8 1.9  --     CBC:  Recent Labs Lab 11/16/15 1922 11/17/15 1043 11/18/15 0458 11/19/15 0404  WBC 9.2 23.5* 17.4* 13.2*  NEUTROABS 8.7* 19.7* 15.0* 11.3*  HGB 9.5* 8.4* 8.1* 8.3*  HCT 33.5* 29.4* 28.4* 29.4*  MCV 86.6 86.0 87.4 87.5  PLT 191 178 164 192    Liver Function Tests:  Recent Labs Lab 11/16/15 1922 11/18/15 0458 11/19/15 0404  AST 21 32 47*  ALT ALKPHOS 79 61 84  BILITOT 1.2 0.5 0.5  PROT 6.7 6.2* 7.0  ALBUMIN 2.9* 2.4* 2.6*    Coags:  Recent Labs Lab 11/16/15 1922  INR 1.54*    Recent Labs Lab 11/16/15 1922  APTT 29    Recent Results (from the past 240 hour(s))  Culture, blood (Routine x 2)     Status: Abnormal   Collection Time: 11/16/15  1:03 PM  Result Value Ref Range Status   Specimen Description BLOOD RIGHT HAND  Final   Special Requests BOTTLES DRAWN AEROBIC AND ANAEROBIC 5CC  Final   Culture  Setup Time   Final    GRAM NEGATIVE RODS ANAEROBIC BOTTLE ONLY CRITICAL RESULT CALLED TO, READ BACK BY AND VERIFIED WITH: IRBY,RN  11/17/15 MKELLY    Culture ESCHERICHIA COLI (A)  Final   Report Status 11/19/2015 FINAL  Final   Organism ID, Bacteria ESCHERICHIA COLI  Final      Susceptibility   Escherichia coli - MIC*    AMPICILLIN >=32 RESISTANT Resistant     CEFAZOLIN <=4 SENSITIVE Sensitive     CEFEPIME <=1 SENSITIVE Sensitive     CEFTAZIDIME <=1 SENSITIVE Sensitive     CEFTRIAXONE <=1 SENSITIVE Sensitive     CIPROFLOXACIN <=0.25 SENSITIVE Sensitive     GENTAMICIN <=1 SENSITIVE Sensitive     IMIPENEM <=0.25 SENSITIVE Sensitive     TRIMETH/SULFA <=20 SENSITIVE Sensitive     AMPICILLIN/SULBACTAM >=32 RESISTANT Resistant     PIP/TAZO 64 INTERMEDIATE Intermediate     * ESCHERICHIA COLI  Culture, blood (Routine x 2)     Status: Abnormal   Collection Time: 11/16/15  1:05 PM  Result Value Ref  Range Status   Specimen Description BLOOD WRIST RIGHT  Final   Special Requests BOTTLES DRAWN AEROBIC AND ANAEROBIC 5CC  Final   Culture  Setup Time   Final    GRAM POSITIVE COCCI IN CLUSTERS AEROBIC BOTTLE ONLY CRITICAL RESULT CALLED TO, READ BACK BY AND VERIFIED WITH: Jenetta Downer RN 1551 11/17/15 A BROWNING    Culture (A)  Final    STAPHYLOCOCCUS SPECIES (COAGULASE NEGATIVE) THE SIGNIFICANCE OF ISOLATING THIS ORGANISM FROM A SINGLE SET OF BLOOD CULTURES WHEN MULTIPLE SETS ARE DRAWN IS UNCERTAIN. PLEASE NOTIFY  THE MICROBIOLOGY DEPARTMENT WITHIN ONE WEEK IF SPECIATION AND SENSITIVITIES ARE REQUIRED.    Report Status 11/19/2015 FINAL  Final  Urine culture     Status: Abnormal   Collection Time: 11/16/15  2:11 PM  Result Value Ref Range Status   Specimen Description URINE, CLEAN CATCH  Final   Special Requests NONE  Final   Culture >=100,000 COLONIES/mL ESCHERICHIA COLI (A)  Final   Report Status 11/18/2015 FINAL  Final   Organism ID, Bacteria ESCHERICHIA COLI (A)  Final      Susceptibility   Escherichia coli - MIC*    AMPICILLIN >=32 RESISTANT Resistant     CEFAZOLIN <=4 SENSITIVE Sensitive     CEFTRIAXONE <=1 SENSITIVE Sensitive     CIPROFLOXACIN <=0.25 SENSITIVE Sensitive     GENTAMICIN <=1 SENSITIVE Sensitive     IMIPENEM <=0.25 SENSITIVE Sensitive     NITROFURANTOIN <=16 SENSITIVE Sensitive     TRIMETH/SULFA <=20 SENSITIVE Sensitive     AMPICILLIN/SULBACTAM >=32 RESISTANT Resistant     PIP/TAZO 64 INTERMEDIATE Intermediate     * >=100,000 COLONIES/mL ESCHERICHIA COLI  MRSA PCR Screening     Status: None   Collection Time: 11/16/15  4:18 PM  Result Value Ref Range Status   MRSA by PCR NEGATIVE NEGATIVE Final    Comment:        The GeneXpert MRSA Assay (FDA approved for NASAL specimens only), is one component of a comprehensive MRSA colonization surveillance program. It is not intended to diagnose MRSA infection nor to guide or monitor treatment for MRSA infections.    Culture, blood (x 2)     Status: None   Collection Time: 11/16/15  7:16 PM  Result Value Ref Range Status   Specimen Description BLOOD LEFT HAND  Final   Special Requests IN PEDIATRIC BOTTLE 1CC   Final   Culture NO GROWTH 5 DAYS  Final   Report Status 11/21/2015 FINAL  Final  Culture, blood (x 2)     Status: None   Collection Time: 11/16/15  7:22 PM  Result Value Ref Range Status   Specimen Description BLOOD RIGHT ANTECUBITAL  Final   Special Requests IN PEDIATRIC BOTTLE 1CC   Final   Culture NO GROWTH 5 DAYS  Final   Report Status 11/21/2015 FINAL  Final  Culture, blood (Routine X 2) w Reflex to ID Panel     Status: None (Preliminary result)   Collection Time: 11/22/15  1:20 PM  Result Value Ref Range Status   Specimen Description BLOOD RIGHT ANTECUBITAL  Final   Special Requests BOTTLES DRAWN AEROBIC AND ANAEROBIC 5CC  Final   Culture NO GROWTH < 24 HOURS  Final   Report Status PENDING  Incomplete  Culture, blood (Routine X 2) w Reflex to ID Panel     Status: None (Preliminary result)   Collection Time: 11/22/15  1:25 PM  Result Value Ref Range Status   Specimen Description BLOOD RIGHT ARM  Final   Special Requests BOTTLES DRAWN AEROBIC AND ANAEROBIC 5CC  Final   Culture NO GROWTH < 24 HOURS  Final   Report Status PENDING  Incomplete     Studies:   Recent x-ray studies have been reviewed in detail by the Attending Physician  Scheduled Meds:  Scheduled Meds: . cefTRIAXone (ROCEPHIN)  IV  2 g Intravenous Q24H  . gabapentin  300 mg Oral BID  . metoprolol  50 mg Oral BID  . potassium chloride SA  40 mEq Oral BID  . rivaroxaban  20 mg Oral QHS  . sertraline  100 mg Oral QHS  . simvastatin  40 mg Oral q1800  . spironolactone  25 mg Oral Daily  . torsemide  40 mg Oral BID    Time spent on care of this patient: 35 mins   Penny Pia , MD   [facility] Office  [phone] Pager - Text Page per Loretha Stapler as per below:  On-Call/Text Page:      [email or web address]       password [email or web address]  If 7PM-7AM, please contact night-coverage [email or web address] Password [email or web address] 11/23/2015, 3:59 PM   LOS: 7 days

## 2015-11-23 NOTE — Care Management Note (Signed)
Case Management Note  Patient Details  Name: MAILY EMGE MRN: [id number] Date of Birth: Jul 06, 1941  Subjective/Objective:      Admitted with Sepsis              Action/Plan: Patient is refusing to go to SNF and plans to return home with [facility] services provided by [facility]. Tiffany with [facility] called for arrangements. No DME needed at this time- she has home oxygen, cane and walker.  Expected Discharge Date:   possibly 11/24/2015               Expected Discharge Plan:  Home w Home Health Services  Discharge planning Services  CM Consult  Post Acute Care Choice:  Resumption of Svcs/PTA Provider Choice offered to:     HH Arranged:  RN, PT, OT, Nurse's Aide HH Agency:  [facility]  Status of Service:  In process, will continue to follow  Medicare Important Message Given:  Yes  Reola Mosher [phone] 11/23/2015, 1:52 PM

## 2015-11-23 NOTE — Progress Notes (Signed)
Physical Therapy Treatment Patient Details Name: [name] MRN: [id number] DOB: [birth year] Today's Date: 11/23/2015    History of Present Illness [name] is a 75 y.o. 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 Placed on Bipap in ED found to have Sepsis due to Escherichia coli UTI / Pyelonephritis w/ bacteremia     PT Comments    [name] continues to require min guard assist for safe transfers and ambulation.  She fatigues quickly, requesting to return to room after ambulating 90 ft.  Pt expressed her desire to go ST SNF at d/c, but feels as though her family is pushing for her to go home.  Pt is concerned she will not have the assist she needs at home as her son is at work during the day and her husband is unable to assist pt physically.  Therefore, recommendation for ST SNF remains most appropriate.   Follow Up Recommendations  SNF;Supervision for mobility/OOB     Equipment Recommendations  None recommended by PT    Recommendations for Other Services       Precautions / Restrictions Precautions Precautions: Fall Precaution Comments: on 2 liters O2 at home Restrictions Weight Bearing Restrictions: No    Mobility  Bed Mobility               General bed mobility comments: Pt sitting in recliner chair upon PT arrival  Transfers Overall transfer level: Needs assistance Equipment used: Rolling walker (2 wheeled) Transfers: Sit to/from Stand Sit to Stand: Min guard         General transfer comment: Slow to stand w/ increased effort.  Good technique w/ hand placement.  Cues for upright posture once  standing.  Ambulation/Gait Ambulation/Gait assistance: Min guard Ambulation Distance (Feet): 90 Feet Assistive device: Rolling walker (2 wheeled) Gait Pattern/deviations: Step-through pattern;Decreased stride length;Trunk flexed   Gait velocity interpretation: Below normal speed for age/gender General Gait Details: Pulse ox unable to read O2 level, pt w/ cold fingers.  Remained on 2L O2 and cues for upright posture and pursed lip breathing.   Stairs            Wheelchair Mobility    Modified Rankin (Stroke Patients Only)       Balance Overall balance assessment: Needs assistance Sitting-balance support: No upper extremity supported;Feet supported Sitting balance-Leahy Scale: Good     Standing balance support: Bilateral upper extremity supported;During functional activity Standing balance-Leahy Scale: Poor Standing balance comment: RW for support                    Cognition Arousal/Alertness: Awake/alert Behavior During Therapy: WFL for tasks assessed/performed Overall Cognitive Status: Within Functional Limits for tasks assessed                      Exercises General Exercises - Lower Extremity Ankle Circles/Pumps: AROM;Both;10 reps;Seated Long Arc Quad: AROM;Both;10 reps;Seated    General Comments        Pertinent Vitals/Pain Pain Assessment: No/denies pain    Home Living  Prior Function            PT Goals (current goals can now be found in the care plan section) Acute Rehab PT Goals Patient Stated Goal: maybe to rehab then home PT Goal Formulation: With patient/family Time For Goal Achievement: 12/05/15 Potential to Achieve Goals: Good Progress towards PT goals: Progressing toward goals    Frequency  Min 3X/week    PT Plan Current plan remains appropriate    Co-evaluation             End of Session Equipment Utilized During Treatment: Gait belt;Oxygen Activity Tolerance: Patient  tolerated treatment well;Patient limited by fatigue Patient left: with call bell/phone within reach;in chair     Time: 6861-6837 PT Time Calculation (min) (ACUTE ONLY): 28 min  Charges:  $Gait Training: 8-22 mins $Therapeutic Exercise: 8-22 mins                    G Codes:      Encarnacion Chu PT, DPT  Pager: [phone] Phone: [phone] 11/23/2015, 3:35 PM

## 2015-11-23 NOTE — Care Management Important Message (Signed)
Important Message  Patient Details  Name: [name] MRN: [id number] Date of Birth: 09/14/1940   Medicare Important Message Given:  Yes    [name] P [name] 11/23/2015, 1:53 PM

## 2015-11-23 NOTE — Clinical Social Work Note (Signed)
CSW met with patient with RNCM. Patient wants to go home instead of a SNF. RNCM will call [facility] for continued care.  CSW signing off. Consult if any new social work needs arise.  Dayton Scrape, [place]

## 2015-11-24 MED ORDER — SULFAMETHOXAZOLE-TRIMETHOPRIM 800-160 MG PO TABS: 1.0000 | ORAL_TABLET | Freq: Two times a day (BID) | ORAL | Status: AC

## 2015-11-24 NOTE — Discharge Summary (Signed)
Physician Discharge Summary  [name]:[id number] DOB: [birth year] DOA: 11/16/2015  PCP: Astrid Divine, MD  Admit date: 11/16/2015 Discharge date: 11/24/2015  Time spent: > 35 minutes  Recommendations for Outpatient Follow-up:  1. Monitor wbc levels 2. Pt will complete 14 days of antibiotics for treatment of e coli bacteremia. Needs 5 more days of bactrim to complete the 14 day total treatment regimen   Discharge Diagnoses:  Principal Problem:   Sepsis (HCC) Active Problems:   Pulmonary hypertension (HCC)   DIASTOLIC DYSFUNCTION   COPD mixed type (HCC)   Obstructive sleep apnea   Chronic diastolic heart failure (HCC)   Chronic anticoagulation   HTN (hypertension)   Dyslipidemia, goal LDL below 70   AKI (acute kidney injury) (HCC)   Leukocytosis   SIRS (systemic inflammatory response syndrome) (HCC)   Acute pyelonephritis   Acute on chronic respiratory failure with hypoxia and hypercapnia (HCC)   Acute diastolic congestive heart failure (HCC)   Sepsis, unspecified organism (HCC)   Urinary tract infection, site not specified   Bacteremia   Chronic diastolic CHF (congestive heart failure) (HCC)   Chronic atrial fibrillation (HCC)   Alcoholic cardiomyopathy (HCC)   Acute kidney injury (HCC)   Sepsis secondary to UTI (HCC)   Bacteremia, escherichia coli   Rheumatic mitral valve disease   PFO (patent foramen ovale)   Cardiomyopathy (HCC)   Discharge Condition: stable  Diet recommendation: Heart healthy  Filed Weights   11/22/15 0652 11/23/15 0701 11/24/15 0400  Weight: 80.786 kg (178 lb 1.6 oz) 80.287 kg (177 lb) 81.012 kg (178 lb 9.6 oz)    History of present illness:  75 y.o. F Hx Anxiety, COPD on 2 L O2, Pulmonary HTN, HTN, Chronic Diastolic CHF, PVD, CAD, GERD, and HLD who presented with Sepsis due to UTI/Pyelo. Diagnosed with E coli bacteremia and treated with IV rocephin.  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 Will discharge on Bactrim  Hypertension  -BP stable at this time   AoV Vegetation -saw Dr Orvan Falconer 10/15/15 - he recommended continued observations off of antibiotics at that time - see discussion above   Rheumatic Mitral Valve Stenosis -Per Cardiology treating medically  Small PFO  Chronic Diastolic CHF / Pulmonary Hypertension/ R Heart failure  Dry weight ~178# (80.7kg), but usual weight appears to be closer to 83kg - Appears to be relatively volume balanced at this time - we'll continue home medication regimen    Consultations:  None  Discharge Exam: Filed Vitals:   11/24/15 0353 11/24/15 1100  BP: 113/56 128/90  Pulse: 82 83  Temp: 98 F (36.7 C)   Resp: 18     General: Pt in nad, alert and awake Cardiovascular: rrr, no rubs Respiratory: no increased wob, no wheezes  Discharge Instructions   Discharge Instructions    Call MD for:  difficulty breathing, headache or visual disturbances    Complete by:  As directed      Call MD for:  severe uncontrolled pain    Complete by:  As directed      Call MD for:  temperature >100.4    Complete by:  As  directed      Diet - low sodium heart healthy    Complete by:  As directed      Discharge instructions    Complete by:  As directed   Please be sure to follow up with your primary care physician within the next 1 weeks for further evaluation and recommendations.     Increase activity slowly    Complete by:  As directed           Current Discharge Medication List    START taking these medications   Details  sulfamethoxazole-trimethoprim (BACTRIM DS,SEPTRA DS) 800-160 MG tablet Take  1 tablet by mouth 2 (two) times daily. Qty: 10 tablet, Refills: 0      CONTINUE these medications which have NOT CHANGED   Details  acetaminophen (TYLENOL) 500 MG tablet Take 1,000 mg by mouth daily as needed for headache.    azelastine (ASTELIN) 0.1 % nasal spray Place 1 spray into both nostrils 2 (two) times daily. Use in each nostril as directed    calcium-vitamin D (OSCAL WITH D) 500-200 MG-UNIT per tablet Take 2 tablets by mouth daily with breakfast.    gabapentin (NEURONTIN) 300 MG capsule Take 300 mg by mouth 2 (two) times daily.    loratadine (CLARITIN) 10 MG tablet Take 10 mg by mouth at bedtime.     metoprolol (LOPRESSOR) 50 MG tablet Take 1 tablet by mouth two  times daily Qty: 180 tablet, Refills: 1    nitroGLYCERIN (NITROSTAT) 0.4 MG SL tablet Place 1 tablet (0.4 mg total) under the tongue every 5 (five) minutes as needed for chest pain. Qty: 25 tablet, Refills: 3    omeprazole (PRILOSEC OTC) 20 MG tablet Take 20 mg by mouth at bedtime as needed (GERD).     OXYGEN Inhale 2 L into the lungs continuous.    potassium chloride SA (K-DUR,KLOR-CON) 20 MEQ tablet Take 2 tablets (40 mEq total) by mouth 2 (two) times daily. Qty: 120 tablet, Refills: 3    rivaroxaban (XARELTO) 20 MG TABS tablet Take 1 tablet (20 mg total) by mouth at bedtime. Qty: 90 tablet, Refills: 2    sertraline (ZOLOFT) 100 MG tablet Take 100 mg by mouth at bedtime.     simvastatin (ZOCOR) 40 MG tablet Take 1 tablet by mouth  every evening Qty: 90 tablet, Refills: 0    spironolactone (ALDACTONE) 25 MG tablet Take 1 tablet (25 mg total) by mouth daily. Qty: 30 tablet, Refills: 6    torsemide (DEMADEX) 20 MG tablet Take 2 tablets (40 mg total) by mouth 2 (two) times daily. Qty: 360 tablet, Refills: 3    alendronate (FOSAMAX) 70 MG tablet Take 70 mg by mouth once a week. Reported on 10/27/2015 Refills: 0       Allergies  Allergen Reactions  . Prednisone Rash    jittery  . Tape Other (See  Comments)    Bleeding  . Xopenex [Levalbuterol] Other (See Comments)    Made mouth and throat sore  . Cefpodoxime Other (See Comments)    Yeast infections   . Codeine Other (See Comments)    Anxious/ jittery  . Lipitor [Atorvastatin] Other (See Comments)    Made joint start hurting/ Muscle aches  . Other Swelling    Venholin HFA  . Penicillins Other (See Comments)    Has patient had a PCN reaction causing immediate rash, facial/tongue/throat swelling, SOB or lightheadedness with hypotension: unknown, childhood reaction Has patient had a PCN reaction causing severe rash involving  mucus membranes or skin necrosis: No Has patient had a PCN reaction that required hospitalization No Has patient had a PCN reaction occurring within the last 10 years: No If all of the above answers are "NO", then may proceed with Cephalosporin use.   Terald Sleeper [Kdc:Albuterol] Other (See Comments)    "jittery"  . Clindamycin Other (See Comments)    Unknown   . Hydrocodone-Acetaminophen Anxiety  . Latex Itching and Rash  . Levalbuterol Hcl Other (See Comments)    Unknown    Follow-up Information    Follow up with [facility].   Why:  They will continue to do your home health care at your home   Contact information:   [address] [place] [address] [phone]       Follow up with Astrid Divine, MD.   Specialty:  Family Medicine   Why:  Tally Due will make appointment left message @ office   Contact information:   [address] [place] [address] [phone]        The results of significant diagnostics from this hospitalization (including imaging, microbiology, ancillary and laboratory) are listed below for reference.    Significant Diagnostic Studies: Dg Chest Port 1 View  11/17/2015  CLINICAL DATA:  Dyspnea. EXAM: PORTABLE CHEST 1 VIEW COMPARISON:  11/17/2015 at 6:45 a.m. and 11/16/2015. FINDINGS: Patient slightly rotated to the  left. Sternotomy wires are unchanged. Lungs are adequately inflated with mild stable elevation of the left hemidiaphragm. Mild linear density in the left retrocardiac region unchanged likely atelectasis. Mild stable cardiomegaly. Remainder of the exam is unchanged. IMPRESSION: Minimal stable linear density in the left retrocardiac region likely atelectasis. Stable cardiomegaly. Electronically Signed   By: Elberta Fortis M.D.   On: 11/17/2015 17:13   Dg Chest Port 1 View  11/17/2015  CLINICAL DATA:  Sepsis, history of COPD, CHF, SIRS, former smoker. EXAM: PORTABLE CHEST 1 VIEW COMPARISON:  Portable chest x-ray of November 16, 2015 FINDINGS: The cardiac silhouette remains enlarged. The pulmonary vascularity remains engorged but is more distinct and less congested overall. There is decreased pulmonary interstitial edema. There is probable small left pleural effusion. The mediastinum is normal in width. The patient has undergone previous CABG. The observed bony thorax is unremarkable. IMPRESSION: Interval decrease in pulmonary edema consistent with improving CHF. There is no pneumonia. Electronically Signed   By: David  [place] M.D.   On: 11/17/2015 07:41   Dg Chest Port 1 View  11/16/2015  CLINICAL DATA:  Three-day history of increased generalized weakness and respiratory distress. History of COPD, hypertension and CHF. EXAM: PORTABLE CHEST 1 VIEW COMPARISON:  Earlier same day; 09/24/2015; 10/14/2014 FINDINGS: Grossly unchanged enlarged cardiac silhouette and mediastinal contours post median sternotomy and CABG. Pulmonary vasculature appears slightly less distinct in the present examination. Worsening bibasilar heterogeneous opacities, right greater than left. No definite pleural effusion or pneumothorax. No acute osseus abnormalities. IMPRESSION: Findings suggestive of worsening pulmonary edema and bibasilar atelectasis, though note, underlying developing infection not excluded. Continued attention on follow-up is  recommended. Electronically Signed   By: Simonne Come M.D.   On: 11/16/2015 19:03   Dg Chest Portable 1 View  11/16/2015  CLINICAL DATA:  Shortness of breath.  Fatigue, generalized weakness. EXAM: PORTABLE CHEST 1 VIEW COMPARISON:  09/24/2015 FINDINGS: Prior CABG. Cardiomegaly with vascular congestion. Scarring or atelectasis at the left base. Right lung is clear. No effusions. No acute bony abnormality. IMPRESSION: Cardiomegaly with vascular congestion. Left basilar scarring  or atelectasis. Electronically Signed   By: Charlett Nose M.D.   On: 11/16/2015 15:18    Microbiology: Recent Results (from the past 240 hour(s))  Culture, blood (Routine x 2)     Status: Abnormal   Collection Time: 11/16/15  1:03 PM  Result Value Ref Range Status   Specimen Description BLOOD RIGHT HAND  Final   Special Requests BOTTLES DRAWN AEROBIC AND ANAEROBIC 5CC  Final   Culture  Setup Time   Final    GRAM NEGATIVE RODS ANAEROBIC BOTTLE ONLY CRITICAL RESULT CALLED TO, READ BACK BY AND VERIFIED WITH: IRBY,RN @0506  11/17/15 MKELLY    Culture ESCHERICHIA COLI (A)  Final   Report Status 11/19/2015 FINAL  Final   Organism ID, Bacteria ESCHERICHIA COLI  Final      Susceptibility   Escherichia coli - MIC*    AMPICILLIN >=32 RESISTANT Resistant     CEFAZOLIN <=4 SENSITIVE Sensitive     CEFEPIME <=1 SENSITIVE Sensitive     CEFTAZIDIME <=1 SENSITIVE Sensitive     CEFTRIAXONE <=1 SENSITIVE Sensitive     CIPROFLOXACIN <=0.25 SENSITIVE Sensitive     GENTAMICIN <=1 SENSITIVE Sensitive     IMIPENEM <=0.25 SENSITIVE Sensitive     TRIMETH/SULFA <=20 SENSITIVE Sensitive     AMPICILLIN/SULBACTAM >=32 RESISTANT Resistant     PIP/TAZO 64 INTERMEDIATE Intermediate     * ESCHERICHIA COLI  Culture, blood (Routine x 2)     Status: Abnormal   Collection Time: 11/16/15  1:05 PM  Result Value Ref Range Status   Specimen Description BLOOD WRIST RIGHT  Final   Special Requests BOTTLES DRAWN AEROBIC AND ANAEROBIC 5CC  Final    Culture  Setup Time   Final    GRAM POSITIVE COCCI IN CLUSTERS AEROBIC BOTTLE ONLY CRITICAL RESULT CALLED TO, READ BACK BY AND VERIFIED WITH: Jenetta Downer RN 1551 11/17/15 A BROWNING    Culture (A)  Final    STAPHYLOCOCCUS SPECIES (COAGULASE NEGATIVE) THE SIGNIFICANCE OF ISOLATING THIS ORGANISM FROM A SINGLE SET OF BLOOD CULTURES WHEN MULTIPLE SETS ARE DRAWN IS UNCERTAIN. PLEASE NOTIFY THE MICROBIOLOGY DEPARTMENT WITHIN ONE WEEK IF SPECIATION AND SENSITIVITIES ARE REQUIRED.    Report Status 11/19/2015 FINAL  Final  Urine culture     Status: Abnormal   Collection Time: 11/16/15  2:11 PM  Result Value Ref Range Status   Specimen Description URINE, CLEAN CATCH  Final   Special Requests NONE  Final   Culture >=100,000 COLONIES/mL ESCHERICHIA COLI (A)  Final   Report Status 11/18/2015 FINAL  Final   Organism ID, Bacteria ESCHERICHIA COLI (A)  Final      Susceptibility   Escherichia coli - MIC*    AMPICILLIN >=32 RESISTANT Resistant     CEFAZOLIN <=4 SENSITIVE Sensitive     CEFTRIAXONE <=1 SENSITIVE Sensitive     CIPROFLOXACIN <=0.25 SENSITIVE Sensitive     GENTAMICIN <=1 SENSITIVE Sensitive     IMIPENEM <=0.25 SENSITIVE Sensitive     NITROFURANTOIN <=16 SENSITIVE Sensitive     TRIMETH/SULFA <=20 SENSITIVE Sensitive     AMPICILLIN/SULBACTAM >=32 RESISTANT Resistant     PIP/TAZO 64 INTERMEDIATE Intermediate     * >=100,000 COLONIES/mL ESCHERICHIA COLI  MRSA PCR Screening     Status: None   Collection Time: 11/16/15  4:18 PM  Result Value Ref Range Status   MRSA by PCR NEGATIVE NEGATIVE Final    Comment:        The GeneXpert MRSA Assay (FDA approved for NASAL specimens only),  is one component of a comprehensive MRSA colonization surveillance program. It is not intended to diagnose MRSA infection nor to guide or monitor treatment for MRSA infections.   Culture, blood (x 2)     Status: None   Collection Time: 11/16/15  7:16 PM  Result Value Ref Range Status   Specimen Description  BLOOD LEFT HAND  Final   Special Requests IN PEDIATRIC BOTTLE 1CC   Final   Culture NO GROWTH 5 DAYS  Final   Report Status 11/21/2015 FINAL  Final  Culture, blood (x 2)     Status: None   Collection Time: 11/16/15  7:22 PM  Result Value Ref Range Status   Specimen Description BLOOD RIGHT ANTECUBITAL  Final   Special Requests IN PEDIATRIC BOTTLE 1CC   Final   Culture NO GROWTH 5 DAYS  Final   Report Status 11/21/2015 FINAL  Final  Culture, blood (Routine X 2) w Reflex to ID Panel     Status: None (Preliminary result)   Collection Time: 11/22/15  1:20 PM  Result Value Ref Range Status   Specimen Description BLOOD RIGHT ANTECUBITAL  Final   Special Requests BOTTLES DRAWN AEROBIC AND ANAEROBIC 5CC  Final   Culture NO GROWTH 2 DAYS  Final   Report Status PENDING  Incomplete  Culture, blood (Routine X 2) w Reflex to ID Panel     Status: None (Preliminary result)   Collection Time: 11/22/15  1:25 PM  Result Value Ref Range Status   Specimen Description BLOOD RIGHT ARM  Final   Special Requests BOTTLES DRAWN AEROBIC AND ANAEROBIC 5CC  Final   Culture NO GROWTH 2 DAYS  Final   Report Status PENDING  Incomplete     Labs: Basic Metabolic Panel:  Recent Labs Lab 11/18/15 0458 11/19/15 0404 11/21/15 0528  NA 141 143 144  K 4.9 4.0 4.0  CL 101 96* 94*  CO2 32 37* 40*  GLUCOSE 128* 146* 103*  BUN 22* 19 16  CREATININE 1.03* 0.86 0.80  CALCIUM 8.8* 9.0 9.3  MG 1.8 1.9  --    Liver Function Tests:  Recent Labs Lab 11/18/15 0458 11/19/15 0404  AST 32 47*  ALT 16 30  ALKPHOS 61 84  BILITOT 0.5 0.5  PROT 6.2* 7.0  ALBUMIN 2.4* 2.6*   No results for input(s): LIPASE, AMYLASE in the last 168 hours. No results for input(s): AMMONIA in the last 168 hours. CBC:  Recent Labs Lab 11/18/15 0458 11/19/15 0404  WBC 17.4* 13.2*  NEUTROABS 15.0* 11.3*  HGB 8.1* 8.3*  HCT 28.4* 29.4*  MCV 87.4 87.5  PLT 164 192   Cardiac Enzymes: No results for input(s): CKTOTAL, CKMB,  CKMBINDEX, TROPONINI in the last 168 hours. BNP: BNP (last 3 results)  Recent Labs  09/24/15 1605 09/29/15 0428 11/16/15 1922  BNP 436.5* 165.4* 413.5*    ProBNP (last 3 results)  Recent Labs  01/09/15 0907  PROBNP 245.0*    CBG: No results for input(s): GLUCAP in the last 168 hours.     Signed:  Penny Pia MD.  [facility] 11/24/2015, 1:15 PM

## 2015-11-24 NOTE — Progress Notes (Signed)
Patient given discharge instructions and all questions answered.  Patient discharged via wheelchair with all belongings.   

## 2015-11-24 NOTE — Progress Notes (Signed)
Occupational Therapy Treatment Patient Details Name: [name] MRN: [id number] DOB: 02-15-1941 Today's Date: 11/24/2015    History of present illness [name] is a 75 y.o. 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 Placed on Bipap in ED found to have Sepsis due to Escherichia coli UTI / Pyelonephritis w/ bacteremia    OT comments  Patient making good progress towards OT goals, continue plan of care for now. Pt interested in going home instead of to [facility], discussed therapists recommendation of ST SNF for patient's safety. As stated below, if pt refuses SNF, recommend HHOT with 24/7 supervision. Pt reports husband is unable to assist because he is w/c level for all tasks and pt reports son works during the day.    Follow Up Recommendations  SNF;Supervision/Assistance - 24 hour (IF she refuses SNF, recommend HHOT with 24/7 supervision)    Equipment Recommendations  None recommended by OT    Recommendations for Other Services  None at this time   Precautions / Restrictions Precautions Precautions: Fall Precaution Comments: on 2 liters O2 at home Restrictions Weight Bearing Restrictions: No    Mobility Bed Mobility General bed mobility comments: Pt sitting in recliner chair upon OT arrival  Transfers Overall transfer level: Needs assistance Equipment used: Rolling walker (2 wheeled) Transfers: Sit to/from Stand Sit to Stand: Min guard         General transfer comment: Slow to stand w/ increased effort.  Good technique w/ hand placement.  Cues for upright posture once standing.    Balance Overall balance assessment: Needs  assistance Sitting-balance support: No upper extremity supported;Feet supported Sitting balance-Leahy Scale: Good     Standing balance support: No upper extremity supported;During functional activity Standing balance-Leahy Scale: Fair Standing balance comment: standing at sink for grooming task with no UE support   ADL Overall ADL's : Needs assistance/impaired Eating/Feeding: Independent;Sitting   Grooming: Set up;Sitting;Standing;Supervision/safety Grooming Details (indicate cue type and reason): sit to/from stand position at sink  Upper Body Bathing: Set up;Sitting   Lower Body Bathing: Supervison/ safety;Sit to/from stand   Upper Body Dressing : Set up;Sitting   Lower Body Dressing: Supervision/safety;Sit to/from stand   Toilet Transfer: Designer, television/film set Details (indicate cue type and reason): simulated  General ADL Comments: Educated pt on additional energy conservation techniques of taking seated rest breaks prn, no hot shower due to steam can take breath away, sitting for showers, etc. AND importance of having 24/7 IF she goes home, including supervision/assistance for shower stall transfers.      Cognition   Behavior During Therapy: WFL for tasks assessed/performed Overall Cognitive Status: Within Functional Limits for tasks assessed                 Pertinent Vitals/ Pain       Pain Assessment: No/denies pain   Frequency Min 2X/week     Progress Toward Goals  OT Goals(current goals can now befound in the care plan section)  Progress towards OT goals: Progressing toward goals;Goals met and updated - see care plan  Acute Rehab OT Goals Patient Stated Goal: I want to go home now OT Goal Formulation: With patient  Time For Goal Achievement: 12/04/15 Potential to Achieve Goals: Good ADL Goals Pt Will Perform Upper Body Bathing: with modified independence;sitting Pt Will Perform Lower Body Bathing: with modified independence;sit to/from stand Pt  Will Perform Upper Body Dressing: with modified independence;sitting Pt Will Perform Lower Body Dressing: with modified independence;sit to/from stand  Plan Discharge plan remains appropriate    End of Session Equipment Utilized During Treatment: Rolling walker;Oxygen   Activity Tolerance Patient tolerated treatment well   Patient Left in chair;with call bell/phone within reach    Time: 3818-4037 OT Time Calculation (min): 34 min  Charges: OT General Charges $OT Visit: 1 Procedure OT Treatments $Self Care/Home Management : 23-37 mins  Chrys Racer , MS, OTR/L, CLT Pager: [phone]   11/24/2015, 11:19 AM

## 2015-11-26 ENCOUNTER — Telehealth (HOSPITAL_COMMUNITY): Payer: Self-pay | Admitting: *Deleted

## 2015-11-26 NOTE — Telephone Encounter (Signed)
Amy, RN called to orders to re-cert pt's [facility] care, gave VO

## 2015-11-27 LAB — CULTURE, BLOOD (ROUTINE X 2)
CULTURE: NO GROWTH
Culture: NO GROWTH

## 2015-11-30 ENCOUNTER — Telehealth (HOSPITAL_COMMUNITY): Payer: Self-pay | Admitting: Vascular Surgery

## 2015-11-30 NOTE — Telephone Encounter (Signed)
Dr. Valentina Lucks office called they would like this pt to be seen ASAP pt was in th hospital last week please advise

## 2015-11-30 NOTE — Telephone Encounter (Signed)
She can see [name] this week

## 2015-12-02 ENCOUNTER — Ambulatory Visit (HOSPITAL_COMMUNITY)
Admission: RE | Admit: 2015-12-02 | Discharge: 2015-12-02 | Disposition: A | Discharge status: Home / Self Care | Payer: Medicare Other | Source: Ambulatory Visit | Attending: Cardiology | Admitting: Cardiology

## 2015-12-02 VITALS — BP 102/56 | HR 100 | Wt 184.2 lb

## 2015-12-02 DIAGNOSIS — I110 Hypertensive heart disease with heart failure: Secondary | ICD-10-CM | POA: Diagnosis not present

## 2015-12-02 DIAGNOSIS — I482 Chronic atrial fibrillation, unspecified: Secondary | ICD-10-CM

## 2015-12-02 DIAGNOSIS — G4733 Obstructive sleep apnea (adult) (pediatric): Secondary | ICD-10-CM | POA: Diagnosis not present

## 2015-12-02 DIAGNOSIS — I4891 Unspecified atrial fibrillation: Secondary | ICD-10-CM | POA: Insufficient documentation

## 2015-12-02 DIAGNOSIS — I330 Acute and subacute infective endocarditis: Secondary | ICD-10-CM | POA: Insufficient documentation

## 2015-12-02 DIAGNOSIS — I25810 Atherosclerosis of coronary artery bypass graft(s) without angina pectoris: Secondary | ICD-10-CM | POA: Insufficient documentation

## 2015-12-02 DIAGNOSIS — I509 Heart failure, unspecified: Secondary | ICD-10-CM | POA: Diagnosis present

## 2015-12-02 DIAGNOSIS — Z951 Presence of aortocoronary bypass graft: Secondary | ICD-10-CM | POA: Insufficient documentation

## 2015-12-02 DIAGNOSIS — J449 Chronic obstructive pulmonary disease, unspecified: Secondary | ICD-10-CM | POA: Insufficient documentation

## 2015-12-02 DIAGNOSIS — Z7901 Long term (current) use of anticoagulants: Secondary | ICD-10-CM

## 2015-12-02 DIAGNOSIS — I5032 Chronic diastolic (congestive) heart failure: Secondary | ICD-10-CM | POA: Diagnosis not present

## 2015-12-02 DIAGNOSIS — I272 Other secondary pulmonary hypertension: Secondary | ICD-10-CM | POA: Diagnosis not present

## 2015-12-02 NOTE — Progress Notes (Signed)
Advanced Heart Failure Medication Review by a Pharmacist  Does the patient  feel that his/her medications are working for him/her?  yes  Has the patient been experiencing any side effects to the medications prescribed?  yes  Does the patient measure his/her own blood pressure or blood glucose at home?  yes   Does the patient have any problems obtaining medications due to transportation or finances?   Yes - Xarelto copays high, have asked her to contact [facility] pharmacy for a print out for the year of out of pocket cost; if has spent 3% of annual income ($762.83) then would be eligible for [employer] patient assistance  Understanding of regimen: good Understanding of indications: good Potential of compliance: good Patient understands to avoid NSAIDs. Patient understands to avoid decongestants.  Issues to address at subsequent visits: Xarelto cost/patient assistance   Pharmacist comments:  [name] is a pleasant 75 yo F presenting with her granddaughter and without a medication list. She reports good compliance with her regimen. She did state that she has been more tired and "in a fog" lately. HF medications have been stable for quite some time but could be a side effect of the gabapentin. Could attempt to take the gabapentin once daily at bedtime to see if this would help. She did not have any other medication-related questions or concerns at this time.   Tyler Deis. [name], PharmD, BCPS, CPP Clinical Pharmacist Pager: [phone] Phone: [phone] 12/02/2015 12:30 PM      Time with patient: 10 minutes Preparation and documentation time: 6 minutes Total time: 16 minutes

## 2015-12-02 NOTE — Patient Instructions (Signed)
Take Neurontin 300 mg at bed time only   Follow up in 6 weeks with Dr Shirlee Latch

## 2015-12-02 NOTE — Progress Notes (Signed)
Patient ID: [name], female   DOB: 1941/01/25, 75 y.o.   MRN: [id number] Patient ID: [name], female   DOB: [birth year], 75 y.o.   MRN: [id number]    Advanced Heart Failure Clinic Note   PCP: Dr Valentina Lucks  Primary Cardiologist: Dr Mayford Knife  Pulmonary: Dr Maple Hudson HF Cardiology: Dr. Shirlee Latch  HPI: [name] is a 75 y.o. female with a history of chronic diastolic CHF, moderate pulmonary HTN, HTN, CAD s/p CABG, COPD on home oxygen, and dyslipidemia referred to the HF clinic by Dr Mayford Knife.  She has developed worsening dyspnea with exertion over the last few months.   Admitted 3/2 - 09/30/15 with A/C CHF. TEE was done to assess mitral stenosis, appeared mild to moderate, rheumatic valve.  TEE also showed aortic valve vegetation and concern arose for endocarditis with recent "tooth infection" per pt.  ID saw. 1 of 2 admission cultures with Gram + cocci in clusters thought to be contaminant.  2 further sets of BCx negative. RHC showed elevated right and left heart filling pressures.  Aortic valve was not crossed for full mitral stenosis assessment due to aortic valve vegetation. Diuresed 8 lbs. Discharge weight 175 lbs.   She returns today for regular follow up.  Overall feeling ok. Weight at home 180-183 pounds. Continues to wear oxygen. Has mild SOB with showering since she can't use the oxygen. Uses walker to get at home.  Denies orthopnea/PND. Denies lightheadedness or dizziness. Taking all medications.   - ECHO 12/2014 EF 55-60%, peak PA pressure 68 mmHg, mild to moderate MS. - ECHO 2/17 EF 60-65%, mild LVH, mildly dilated RV with mildly decreased systolic function, PASP 29 mmHg (not sure we got a full envelope), moderate mitral stenosis with mean gradient 6 mmHg and MVA 1.37 cm^2, mild MR, mild AI, aortic sclerosis without significant stenosis.   - TEE 09/24/15 EF 60%, small 0.8 cm mobile mass on the LV side of the aortic valve. Mild AI and AS. 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 A small PFO with a weakly positive bubble study, Moderate TR  RHC 09/24/15 (did not do LHC and cross valve to assess mitral stenosis due to aortic valve vegetation): Hemodynamics (mmHg) RA mean 16 RV 65/14 PA 71/28, mean 45 PCWP mean 32 Cardiac Output (Fick) 4.85  Cardiac Index (Fick) 2.51 PVR 2.68 WU    Labs 01/09/2015 K 4.3 Creatinine 0.91 BNP  Labs 02/19/2015 K 4.1 Creatinine 1.03 Labs 10/05/15 K 3.8, Creatinine 0.89  Labs 10/08/15 K 4.6, Creatinine 0.7 Labs 10/22/15 K 4.8, Creatinine 0.8  Social History: The patient  reports that she quit smoking about 7 years ago. Her smoking use included Cigarettes. She has a 47 pack-year smoking history. She has never used smokeless tobacco. She reports that she does not drink alcohol or use illicit drugs.   Family History: The patient's family history includes Anemia in her mother; Emphysema in her father; Esophageal cancer in her daughter.   ROS: All systems negative except as listed in HPI, PMH and Problem List.  SH:  Social History   Social History  . Marital Status: Married    Spouse Name: N/A  . Number of Children: N/A  . Years of Education: N/A   Occupational History  . Not on file.   Social History Main Topics  . Smoking status: Former Smoker -- 1.00 packs/day for 47 years    Types: Cigarettes    Quit  date: 07/26/2007  . Smokeless tobacco: Never Used  . Alcohol Use: No  . Drug Use: No  . Sexual Activity: No   Other Topics Concern  . Not on file   Social History Narrative   Husband bilateral left amputee for atheorsclerotic disease    FH:  Family History  Problem Relation Age of Onset  . Anemia Mother   . Emphysema Father   . Esophageal cancer Daughter     Past Medical History  Diagnosis Date  . COPD (chronic obstructive pulmonary disease) (HCC)   . Pulmonary HTN (HCC)     PASP 50-13mmHg by [id number]/2585  . History of rheumatic heart disease     X 3-LAST TIME WHEN PATIENT  WAS 75 YRS OLD  . Sleep apnea     intolerant to CPAP  . Hypertension   . Bursitis, shoulder     bilateral  . GERD (gastroesophageal reflux disease)   . H/O hiatal hernia   . Anxiety   . Depression   . Varicose veins   . PUD (peptic ulcer disease)   . 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 DJD (degenerative joint disease)   . OA (osteoarthritis)   . Hyperlipidemia   . Urinary incontinence   . Pulmonary HTN (HCC)   . Thyroid nodule   . CHF (congestive heart failure) (HCC)     chronic diasotlic CHF  . Complication of anesthesia     "have trouble getting me awake sometimes; been ok latelhy" (09/24/2015)  . On home oxygen therapy     "2L; 24/7" (09/24/2015)    Current Outpatient Prescriptions  Medication Sig Dispense Refill  . acetaminophen (TYLENOL) 500 MG tablet Take 1,000 mg by mouth daily as needed for headache.    . calcium-vitamin D (OSCAL WITH D) 500-200 MG-UNIT per tablet Take 2 tablets by mouth daily with breakfast.    . gabapentin (NEURONTIN) 300 MG capsule Take 300 mg by mouth 2 (two) times daily.    Marland Kitchen loratadine (CLARITIN) 10 MG tablet Take 10 mg by mouth at bedtime.     . metoprolol (LOPRESSOR) 50 MG tablet Take 1 tablet by mouth two  times daily 180 tablet 1  . omeprazole (PRILOSEC OTC) 20 MG tablet Take 20 mg by mouth at bedtime as needed (GERD).     . OXYGEN Inhale 2 L into the lungs continuous.    . potassium chloride SA (K-DUR,KLOR-CON) 20 MEQ tablet Take 2 tablets (40 mEq total) by mouth 2 (two) times daily. 120 tablet 3  . rivaroxaban (XARELTO) 20 MG TABS tablet Take 1 tablet (20 mg total) by mouth at bedtime. 90 tablet 2  . sertraline (ZOLOFT) 100 MG tablet Take 100 mg by mouth at bedtime.     . simvastatin (ZOCOR) 40 MG tablet Take 1 tablet by mouth  every evening 90 tablet 0  . spironolactone (ALDACTONE) 25 MG tablet Take 1 tablet (25 mg total) by mouth daily. 30 tablet 6  . torsemide (DEMADEX) 20 MG tablet Take 2 tablets (40 mg total) by mouth 2 (two)  times daily. 360 tablet 3  . alendronate (FOSAMAX) 70 MG tablet Take 70 mg by mouth once a week. Reported on 12/02/2015  0  . nitroGLYCERIN (NITROSTAT) 0.4 MG SL tablet Place 1 tablet (0.4 mg total) under the tongue every 5 (five) minutes as needed for chest pain. (Patient not taking: Reported on 12/02/2015) 25 tablet 3   No current facility-administered medications for this encounter.  Filed Vitals:   12/02/15 1105  BP: 102/56  Pulse: 100  Weight: 184 lb 3.2 oz (83.553 kg)  SpO2: 99%   Wt Readings from Last 3 Encounters:  12/02/15 184 lb 3.2 oz (83.553 kg)  11/24/15 178 lb 9.6 oz (81.012 kg)  10/27/15 183 lb (83.008 kg)     PHYSICAL EXAM:  General:  NAD, wearing oxygen. HEENT: normal Neck: supple. JVP 6-7 with mild HJR. Carotids 2+ bilaterally; no bruits. No thyromegaly or nodule noted.  Cor: PMI normal. RRR. No rubs, gallops.  2/6 SEM RUSB.  Lungs: Clear, normal effort On 2 liters [place] Abdomen: soft, NT, ND, no HSM. No bruits or masses. +BS  Extremities: no cyanosis, clubbing, rash.  No edema.  Neuro: alert & orientedx3, cranial nerves grossly intact. Moves all 4 extremities w/o difficulty. Affect pleasant.  ASSESSMENT & PLAN: 1. Chronic diastolic CHF/valvular heart disease: TEE in 3/17 showed normal EF, mild to moderate mitral stenosis.  RHC showed elevated left and right heart filling pressures. NYHA IIIb symptoms. Volume status stable.   - Continue torsemide 40 mg BID and potassium 40 meq BID     - Continue 25 mg spironolactone daily.  - Discussed limiting fluid intake to < 2 liters per day and low salt diet.  2. CAD S/P CABG: No worrisome chest pain. Continue statin and metoprolol. On Xarelto so no aspirin.  3. Atrial fibrillation: Paroxysmal. CHADSVASC score is 4 (age > 72, female, HTN, CHF).  She remains in NSR.  - Continue Xarelto/metoprolol.  4. Pulmonary HTN: This is primarily pulmonary venous hypertension based on most recent RHC. 5. COPD: Followed by Dr Maple Hudson. On  2 liters [place].  - Saw Dr young 10/15/15. Stable. No med changes amde.  6.  OSA: intolerant CPAP 7.  Mitral stenosis: Rheumatic mitral stenosis.  Mild to moderate mitral stenosis by TEE in 3/17.  Unable to assess by cath because she had aortic valve vegetation and the valve was not crossed. Treating medically for now.  Would be high risk for surgery with severe COPD.  However, possibly could be valvuloplasty candidate.    8. Aortic valve vegetation: No fever, blood cultures negative.  May have come from prior dental infection.  ID did not recommend antibiotics.  - Saw Dr Orvan Falconer 10/15/15. Does not have active endocarditis and he recommends continued observations off of antibiotics. No further work up recommended at this time.   Follow up in 6 weeks with Dr Shirlee Latch.   Amy Clegg NP-C 12/02/2015

## 2015-12-06 ENCOUNTER — Other Ambulatory Visit: Payer: Self-pay | Admitting: Cardiology

## 2015-12-21 ENCOUNTER — Other Ambulatory Visit: Payer: Self-pay | Admitting: Cardiology

## 2015-12-24 ENCOUNTER — Telehealth: Payer: Self-pay | Admitting: Internal Medicine

## 2015-12-24 NOTE — Telephone Encounter (Signed)
LMTCB for [name]  

## 2015-12-25 NOTE — Telephone Encounter (Signed)
LMTCB for Amy @ [facility]

## 2015-12-25 NOTE — Telephone Encounter (Signed)
Spoke with Amy at [facility], states that pt needs a new sleep study to start back on cpap- current sleep study is too old for restart of cpap.    CY ok to order? Thanks!   Last ov: 10/15/15 Next ov: 04/18/16  Allergies  Allergen Reactions  . Prednisone Rash    jittery  . Tape Other (See Comments)    Bleeding  . Xopenex [Levalbuterol] Other (See Comments)    Made mouth and throat sore  . Cefpodoxime Other (See Comments)    Yeast infections   . Codeine Other (See Comments)    Anxious/ jittery  . Lipitor [Atorvastatin] Other (See Comments)    Made joint start hurting/ Muscle aches  . Other Swelling    Venholin HFA  . Penicillins Other (See Comments)    Has patient had a PCN reaction causing immediate rash, facial/tongue/throat swelling, SOB or lightheadedness with hypotension: unknown, childhood reaction Has patient had a PCN reaction causing severe rash involving mucus membranes or skin necrosis: No Has patient had a PCN reaction that required hospitalization No Has patient had a PCN reaction occurring within the last 10 years: No If all of the above answers are "NO", then may proceed with Cephalosporin use.   Terald Sleeper [Kdc:Albuterol] Other (See Comments)    "jittery"  . Clindamycin Other (See Comments)    Unknown   . Hydrocodone-Acetaminophen Anxiety  . Latex Itching and Rash  . Levalbuterol Hcl Other (See Comments)    Unknown    Current Outpatient Prescriptions on File Prior to Visit  Medication Sig Dispense Refill  . acetaminophen (TYLENOL) 500 MG tablet Take 1,000 mg by mouth daily as needed for headache.    . alendronate (FOSAMAX) 70 MG tablet Take 70 mg by mouth once a week. Reported on 12/02/2015  0  . calcium-vitamin D (OSCAL WITH D) 500-200 MG-UNIT per tablet Take 2 tablets by mouth daily with breakfast.    . gabapentin (NEURONTIN) 300 MG capsule Take 300 mg by mouth at bedtime.    Marland Kitchen loratadine (CLARITIN) 10 MG tablet Take 10 mg by mouth at bedtime.     . metoprolol  (LOPRESSOR) 50 MG tablet Take 1 tablet by mouth two  times daily 180 tablet 1  . nitroGLYCERIN (NITROSTAT) 0.4 MG SL tablet Place 1 tablet (0.4 mg total) under the tongue every 5 (five) minutes as needed for chest pain. (Patient not taking: Reported on 12/02/2015) 25 tablet 3  . omeprazole (PRILOSEC OTC) 20 MG tablet Take 20 mg by mouth at bedtime as needed (GERD).     . OXYGEN Inhale 2 L into the lungs continuous.    . potassium chloride SA (K-DUR,KLOR-CON) 20 MEQ tablet Take 2 tablets (40 mEq total) by mouth 2 (two) times daily. 360 tablet 3  . rivaroxaban (XARELTO) 20 MG TABS tablet Take 1 tablet (20 mg total) by mouth daily with supper. Please call and schedule a one year follow up appointment 90 tablet 0  . sertraline (ZOLOFT) 100 MG tablet Take 100 mg by mouth at bedtime.     . simvastatin (ZOCOR) 40 MG tablet Take 1 tablet (40 mg total) by mouth daily at 6 PM. Please call and schedule a one year follow up appointment 90 tablet 0  . spironolactone (ALDACTONE) 25 MG tablet Take 1 tablet (25 mg total) by mouth daily. 30 tablet 6  . torsemide (DEMADEX) 20 MG tablet Take 2 tablets (40 mg total) by mouth 2 (two) times daily. 360 tablet 3  No current facility-administered medications on file prior to visit.

## 2015-12-25 NOTE — Telephone Encounter (Signed)
Ok to order Unattended HST for dx OSA

## 2015-12-29 NOTE — Telephone Encounter (Signed)
LM for [name]/[facility] x 2

## 2015-12-31 ENCOUNTER — Telehealth (HOSPITAL_COMMUNITY): Payer: Self-pay | Admitting: *Deleted

## 2015-12-31 NOTE — Telephone Encounter (Signed)
Amy, RN w/[facility] called concerned about pt, she states pt is more SOB today, she also reports having a cough which is non-productive.  She states pt's wt is stable, no edema and her abd circumference is smaller than normal.  She states her lungs sound wheezy with faint crackles on the right.  She states these are the same symptoms pt had in the past when she had pna and request orders for portable chest x-ray and labs.  Per Dr Shirlee Latch ok for x-ray, bmet, cbc and bnp.  Amy aware, order faxed to [facility]

## 2016-01-01 NOTE — Telephone Encounter (Signed)
Spoke with Amy, RN with [facility].  Amy reports since last call she has found out pt will need a new face to face PRIOR to having sleep study done to have new cpap set up per Medicare guidelines as last face to face visit is too old.  Amy is requesting we call pt to schedule.  Advised would do so.  Amy verbalized understanding and voiced no further questions or concerns at this time.  Spoke with pt.  Discussed above.  Pt scheduled to see TP on 6/13 at 4:15.  Pt confirmed appt and voiced no further questions or concerns at this time.

## 2016-01-04 ENCOUNTER — Encounter: Payer: Self-pay | Admitting: Cardiology

## 2016-01-04 ENCOUNTER — Ambulatory Visit: Payer: Medicare Other | Admitting: Cardiology

## 2016-01-05 ENCOUNTER — Encounter: Payer: Self-pay | Admitting: Internal Medicine

## 2016-01-05 ENCOUNTER — Ambulatory Visit (INDEPENDENT_AMBULATORY_CARE_PROVIDER_SITE_OTHER): Payer: Medicare Other | Admitting: Internal Medicine

## 2016-01-05 ENCOUNTER — Ambulatory Visit (INDEPENDENT_AMBULATORY_CARE_PROVIDER_SITE_OTHER): Payer: Medicare Other | Admitting: Adult Health

## 2016-01-05 ENCOUNTER — Encounter: Payer: Self-pay | Admitting: Adult Health

## 2016-01-05 VITALS — BP 142/72 | HR 64 | Temp 98.0°F

## 2016-01-05 VITALS — BP 124/64 | HR 65 | Temp 98.2°F | Ht 65.0 in | Wt 181.0 lb

## 2016-01-05 DIAGNOSIS — G4733 Obstructive sleep apnea (adult) (pediatric): Secondary | ICD-10-CM

## 2016-01-05 DIAGNOSIS — K1121 Acute sialoadenitis: Secondary | ICD-10-CM

## 2016-01-05 HISTORY — DX: Acute sialoadenitis: K11.21

## 2016-01-05 NOTE — Assessment & Plan Note (Signed)
She had a bout of acute right parotitis that is now resolved. The most common cause of parotitis is staph aureus and it is more common in older adults with dry mouth. There is nothing further to be done at this point she has no indication for further antibiotic therapy. She did have the incidental finding of a small aortic valve vegetation several months ago but evaluation at that time did not show any evidence of endocarditis. There is nothing else to indicate that she needs further evaluation for endocarditis at this time. She can follow-up here as needed.

## 2016-01-05 NOTE — Patient Instructions (Addendum)
Set up for split night sleep study .  Continue on oxygen at 2l/m .  Follow up with .,Dr. Maple Hudson  In 3 months and As needed   Please contact office for sooner follow up if symptoms do not improve or worsen or seek emergency care

## 2016-01-05 NOTE — Progress Notes (Signed)
Subjective:    Patient ID: [name], female    DOB: [birth year], 75 y.o.   MRN: [id number]  HPI 75 yo former smoker with COPD, sleep apnea, complicated by pulmonary HTN, on O2    01/05/2016 Follow up : OSA  Pt presents for evaluation of sleep apnea.  Previously dx with sleep apnea years ago, was not able to wear CPAP . NPSG/ [facility]- 04/02/2008 mild OSA AHI 9.4 per hour Wants to be checked again for sleep apnea. She has daytime sleepiness. Trouble sleeping at night . Sleeps a lot in daytime. Does not go to bed till late, watches TV . Does not think she snores.  We discussed healthy sleep regimen .  Remains on O2 at 2l/m with activity and At bedtime  .  We discussed repeating a sleep study .   Past Medical History  Diagnosis Date  . COPD (chronic obstructive pulmonary disease) (HCC)   . Pulmonary HTN (HCC)     PASP 50-75mmHg by [id number]  . History of rheumatic heart disease     X 3-LAST TIME WHEN PATIENT WAS 75 YRS OLD  . Sleep apnea     intolerant to CPAP  . Hypertension   . Bursitis, shoulder     bilateral  . GERD (gastroesophageal reflux disease)   . H/O hiatal hernia   . Anxiety   . Depression   . Varicose veins   . PUD (peptic ulcer disease)   . 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 DJD (degenerative joint disease)   . OA (osteoarthritis)   . Hyperlipidemia   . Urinary incontinence   . Pulmonary HTN (HCC)   . Thyroid nodule   . CHF (congestive heart failure) (HCC)     chronic diasotlic CHF  . Complication of anesthesia     "have trouble getting me awake sometimes; been ok latelhy" (09/24/2015)  . On home oxygen therapy     "2L; 24/7" (09/24/2015)   Current Outpatient Prescriptions on File Prior to Visit  Medication Sig Dispense Refill  . acetaminophen (TYLENOL) 500 MG tablet Take 1,000 mg by mouth daily as needed for headache.    . calcium-vitamin D (OSCAL WITH D) 500-200 MG-UNIT per tablet Take 2 tablets by mouth daily with breakfast.    . loratadine  (CLARITIN) 10 MG tablet Take 10 mg by mouth at bedtime.     . metoprolol (LOPRESSOR) 50 MG tablet Take 1 tablet by mouth two  times daily 180 tablet 1  . nitroGLYCERIN (NITROSTAT) 0.4 MG SL tablet Place 1 tablet (0.4 mg total) under the tongue every 5 (five) minutes as needed for chest pain. 25 tablet 3  . omeprazole (PRILOSEC OTC) 20 MG tablet Take 20 mg by mouth at bedtime as needed (GERD).     . OXYGEN Inhale 2 L into the lungs continuous.    . potassium chloride SA (K-DUR,KLOR-CON) 20 MEQ tablet Take 2 tablets (40 mEq total) by mouth 2 (two) times daily. 360 tablet 3  . rivaroxaban (XARELTO) 20 MG TABS tablet Take 1 tablet (20 mg total) by mouth daily with supper. Please call and schedule a one year follow up appointment 90 tablet 0  . sertraline (ZOLOFT) 100 MG tablet Take 100 mg by mouth at bedtime.     . simvastatin (ZOCOR) 40 MG tablet Take 1 tablet (40 mg total) by mouth daily at 6 PM. Please call and schedule a one year follow up appointment 90 tablet 0  .  spironolactone (ALDACTONE) 25 MG tablet Take 1 tablet (25 mg total) by mouth daily. 30 tablet 6  . torsemide (DEMADEX) 20 MG tablet Take 2 tablets (40 mg total) by mouth 2 (two) times daily. 360 tablet 3  . alendronate (FOSAMAX) 70 MG tablet Take 70 mg by mouth once a week. Reported on 01/05/2016  0  . gabapentin (NEURONTIN) 300 MG capsule Take 300 mg by mouth at bedtime. Reported on 01/05/2016     No current facility-administered medications on file prior to visit.     Review of Systems Constitutional:   No  weight loss, night sweats,  Fevers, chills, + fatigue, or  lassitude.  HEENT:   No headaches,  Difficulty swallowing,  Tooth/dental problems, or  Sore throat,                No sneezing, itching, ear ache,  +nasal congestion, post nasal drip,   CV:  No chest pain,  Orthopnea, PND, swelling in lower extremities, anasarca, dizziness, palpitations, syncope.   GI  No heartburn, indigestion, abdominal pain, nausea, vomiting,  diarrhea, change in bowel habits, loss of appetite, bloody stools.   Resp:    No chest wall deformity  Skin: no rash or lesions.  GU: no dysuria, change in color of urine, no urgency or frequency.  No flank pain, no hematuria   MS:  No joint pain or swelling.  No decreased range of motion.  No back pain.  Psych:  No change in mood or affect. No depression or anxiety.  No memory loss.         Objective:   Physical Exam Filed Vitals:   01/05/16 1650  BP: 124/64  Pulse: 65  Temp: 98.2 F (36.8 C)  TempSrc: Oral  Height: 5\' 5"  (1.651 m)  Weight: 181 lb (82.101 kg)  SpO2: 97%  Body mass index is 30.12 kg/(m^2).   GEN: A/Ox3; pleasant , NAD, elderly on O2 in wc   HEENT:  [place]/AT,  EACs-clear, TMs-wnl, NOSE-clear, THROAT-clear, no lesions, no postnasal drip or exudate noted. Class 2 MP airway   NECK:  Supple w/ fair ROM; no JVD; normal carotid impulses w/o bruits; no thyromegaly or nodules palpated; no lymphadenopathy.  RESP  Decreased BS in bases , .no accessory muscle use, no dullness to percussion  CARD:  RRR, no m/r/g  , no peripheral edema, pulses intact, no cyanosis or clubbing.  GI:   Soft & nt; nml bowel sounds; no organomegaly or masses detected.  Musco: Warm bil, no deformities or joint swelling noted.   Neuro: alert, no focal deficits noted.    Skin: Warm, no lesions or rashes  Edithe Dobbin NP-C  [facility] 01/05/2016        Assessment & Plan:

## 2016-01-05 NOTE — Progress Notes (Signed)
[facility]  Patient Active Problem List   Diagnosis Date Noted  . 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 Acute parotitis 01/05/2016    Priority: Medium  . Sepsis secondary to UTI (HCC)   . Bacteremia, escherichia coli   . Rheumatic mitral valve disease   . PFO (patent foramen ovale)   . Cardiomyopathy (HCC)   . Sepsis, unspecified organism (HCC)   . Urinary tract infection, site not specified   . Bacteremia   . Chronic diastolic CHF (congestive heart failure) (HCC)   . Chronic atrial fibrillation (HCC)   . Alcoholic cardiomyopathy (HCC)   . Acute kidney injury (HCC)   . Sepsis (HCC) 11/16/2015  . Leukocytosis 11/16/2015  . SIRS (systemic inflammatory response syndrome) (HCC)   . Acute pyelonephritis   . Acute on chronic respiratory failure with hypoxia and hypercapnia (HCC)   . Acute diastolic congestive heart failure (HCC)   . Hypokalemia 10/03/2015  . AKI (acute kidney injury) (HCC) 10/03/2015  . Weakness   . Acute on chronic diastolic (congestive) heart failure (HCC) 09/24/2015  . Acute diastolic (congestive) heart failure (HCC) 09/24/2015  . Lumbar spine scoliosis 04/18/2015  . Mitral stenosis 02/10/2015  . Chronic diastolic heart failure (HCC) 05/10/2013  . Atrial flutter (HCC) 05/10/2013  . Chronic anticoagulation 05/10/2013  . HTN (hypertension) 05/10/2013  . Dyslipidemia, goal LDL below 70 05/10/2013  . Seasonal allergic rhinitis 04/11/2012  . Obstructive sleep apnea 04/28/2008  . DYSPNEA 03/14/2008  . Coronary atherosclerosis 02/27/2008  . COPD mixed type (HCC) 02/27/2008  . Pulmonary hypertension (HCC) 02/21/2008  . DIASTOLIC DYSFUNCTION 02/21/2008    Patient's Medications  New Prescriptions   No medications on file  Previous Medications   ACETAMINOPHEN (TYLENOL) 500 MG TABLET    Take 1,000 mg by mouth daily as needed for headache.   ALENDRONATE (FOSAMAX) 70 MG TABLET    Take 70 mg by mouth once a  week. Reported on 12/02/2015   CALCIUM-VITAMIN D (OSCAL WITH D) 500-200 MG-UNIT PER TABLET    Take 2 tablets by mouth daily with breakfast.   GABAPENTIN (NEURONTIN) 300 MG CAPSULE    Take 300 mg by mouth at bedtime.   LORATADINE (CLARITIN) 10 MG TABLET    Take 10 mg by mouth at bedtime.    METOPROLOL (LOPRESSOR) 50 MG TABLET    Take 1 tablet by mouth two  times daily   NITROGLYCERIN (NITROSTAT) 0.4 MG SL TABLET    Place 1 tablet (0.4 mg total) under the tongue every 5 (five) minutes as needed for chest pain.   OMEPRAZOLE (PRILOSEC OTC) 20 MG TABLET    Take 20 mg by mouth at bedtime as needed (GERD).    OXYGEN    Inhale 2 L into the lungs continuous.   POTASSIUM CHLORIDE SA (K-DUR,KLOR-CON) 20 MEQ TABLET    Take 2 tablets (40 mEq total) by mouth 2 (two) times daily.   RIVAROXABAN (XARELTO) 20 MG TABS TABLET    Take 1 tablet (20 mg total) by mouth daily with supper. Please call and schedule a one year follow up appointment   SERTRALINE (ZOLOFT) 100 MG TABLET    Take 100 mg by mouth at bedtime.    SIMVASTATIN (ZOCOR) 40 MG TABLET    Take 1 tablet (40 mg total) by mouth daily at 6 PM. Please call and schedule a one year follow up appointment   SPIRONOLACTONE (ALDACTONE) 25 MG  TABLET    Take 1 tablet (25 mg total) by mouth daily.   TORSEMIDE (DEMADEX) 20 MG TABLET    Take 2 tablets (40 mg total) by mouth 2 (two) times daily.  Modified Medications   No medications on file  Discontinued Medications   No medications on file    Subjective: [name] has been referred back by her primary care physician, Dr. Valentina Lucks. 2 weeks ago [name] had fairly sudden onset of pain and swelling over her right jaw. She thought she had an ear infection and was seen by Dr. Valentina Lucks on 12/22/2015. She's also been bothered by dry mouth that is worse than normal recently. Dr. Valentina Lucks diagnosed right parotitis and treated her with one dose of ceftriaxone followed by 10 days of cephalexin. The pain and swelling resolved fairly  promptly and she feels like things are back to normal. She has not had any recent fever, chills or sweats.  Review of Systems: Review of Systems  Constitutional: Negative for fever, chills, weight loss, malaise/fatigue and diaphoresis.  HENT: Negative for sore throat.        As noted in history of present illness.  Respiratory: Negative for cough, sputum production and shortness of breath.   Cardiovascular: Negative for chest pain.  Gastrointestinal: Negative for nausea, vomiting and diarrhea.  Skin: Negative for rash.  Neurological: Negative for dizziness and headaches.  Psychiatric/Behavioral: Negative for depression and substance abuse. The patient is not nervous/anxious.     Past Medical History  Diagnosis Date  . COPD (chronic obstructive pulmonary disease) (HCC)   . Pulmonary HTN (HCC)     PASP 50-54mmHg by [id number]  . History of rheumatic heart disease     X 3-LAST TIME WHEN PATIENT WAS 75 YRS OLD  . Sleep apnea     intolerant to CPAP  . Hypertension   . Bursitis, shoulder     bilateral  . GERD (gastroesophageal reflux disease)   . H/O hiatal hernia   . Anxiety   . Depression   . Varicose veins   . PUD (peptic ulcer disease)   . 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 DJD (degenerative joint disease)   . OA (osteoarthritis)   . Hyperlipidemia   . Urinary incontinence   . Pulmonary HTN (HCC)   . Thyroid nodule   . CHF (congestive heart failure) (HCC)     chronic diasotlic CHF  . Complication of anesthesia     "have trouble getting me awake sometimes; been ok latelhy" (09/24/2015)  . On home oxygen therapy     "2L; 24/7" (09/24/2015)    Social History  Substance Use Topics  . Smoking status: Former Smoker -- 1.00 packs/day for 47 years    Types: Cigarettes    Quit date: 07/26/2007  . Smokeless tobacco: Never Used  . Alcohol Use: No    Family History  Problem Relation Age of Onset  . Anemia Mother   . Emphysema Father   . Esophageal cancer Daughter       Allergies  Allergen Reactions  . Prednisone Rash    jittery  . Tape Other (See Comments)    Bleeding  . Xopenex [Levalbuterol] Other (See Comments)    Made mouth and throat sore  . Cefpodoxime Other (See Comments)    Yeast infections   . Codeine Other (See Comments)    Anxious/ jittery  . Lipitor [Atorvastatin] Other (See Comments)    Made joint start hurting/ Muscle aches  .  Other Swelling    Venholin HFA  . Penicillins Other (See Comments)    Has patient had a PCN reaction causing immediate rash, facial/tongue/throat swelling, SOB or lightheadedness with hypotension: unknown, childhood reaction Has patient had a PCN reaction causing severe rash involving mucus membranes or skin necrosis: No Has patient had a PCN reaction that required hospitalization No Has patient had a PCN reaction occurring within the last 10 years: No If all of the above answers are "NO", then may proceed with Cephalosporin use.   Terald Sleeper [Kdc:Albuterol] Other (See Comments)    "jittery"  . Clindamycin Other (See Comments)    Unknown   . Hydrocodone-Acetaminophen Anxiety  . Latex Itching and Rash  . Levalbuterol Hcl Other (See Comments)    Unknown     Objective: Filed Vitals:   01/05/16 1057  BP: 142/72  Pulse: 64  Temp: 98 F (36.7 C)  TempSrc: Oral  SpO2: 98%   There is no weight on file to calculate BMI.  Physical Exam  Constitutional:  She is smiling and in good spirits. She is seated in her wheelchair.  HENT:  Mouth/Throat: No oropharyngeal exudate.  Her mucous membranes are moist. There is no swelling, redness or tenderness over her right parotid gland. She has no palpable adenopathy.  Cardiovascular: Normal rate and regular rhythm.   No murmur heard. Pulmonary/Chest: Effort normal and breath sounds normal. She has no wheezes. She has no rales.  Neurological: She is alert.  Psychiatric: Mood and affect normal.    Lab Results    Problem List Items Addressed This Visit       Medium   Acute parotitis - Primary    She had a bout of acute right parotitis that is now resolved. The most common cause of parotitis is staph aureus and it is more common in older adults with dry mouth. There is nothing further to be done at this point she has no indication for further antibiotic therapy. She did have the incidental finding of a small aortic valve vegetation several months ago but evaluation at that time did not show any evidence of endocarditis. There is nothing else to indicate that she needs further evaluation for endocarditis at this time. She can follow-up here as needed.          Cliffton Asters, MD [facility] [phone] pager   [phone] cell 01/05/2016, 11:24 AM

## 2016-01-06 ENCOUNTER — Telehealth (HOSPITAL_COMMUNITY): Payer: Self-pay | Admitting: *Deleted

## 2016-01-06 NOTE — Telephone Encounter (Signed)
Pt called asking about lab and chest x-ray results from [facility] 6/9.  Reviewed results w/pt, labs all stable, x-ray "no acute evidence of pulmonary pathology"

## 2016-01-07 ENCOUNTER — Other Ambulatory Visit (HOSPITAL_COMMUNITY): Payer: Self-pay | Admitting: Cardiology

## 2016-01-07 NOTE — Assessment & Plan Note (Signed)
Mild OSA , will retest again w/ split night   Plan  Set up for split night sleep test .

## 2016-01-15 ENCOUNTER — Other Ambulatory Visit (HOSPITAL_COMMUNITY): Payer: Self-pay | Admitting: Pharmacist

## 2016-01-15 ENCOUNTER — Other Ambulatory Visit (HOSPITAL_COMMUNITY): Payer: Self-pay | Admitting: Cardiology

## 2016-01-15 MED ORDER — POTASSIUM CHLORIDE CRYS ER 20 MEQ PO TBCR: 40.0000 meq | EXTENDED_RELEASE_TABLET | Freq: Two times a day (BID) | ORAL | Status: DC

## 2016-01-19 ENCOUNTER — Encounter (HOSPITAL_COMMUNITY): Payer: Self-pay

## 2016-01-19 ENCOUNTER — Ambulatory Visit (HOSPITAL_COMMUNITY)
Admission: RE | Admit: 2016-01-19 | Discharge: 2016-01-19 | Disposition: A | Discharge status: Home / Self Care | Payer: Medicare Other | Source: Ambulatory Visit | Attending: Cardiology | Admitting: Cardiology

## 2016-01-19 VITALS — BP 130/86 | HR 72 | Ht 65.0 in | Wt 183.0 lb

## 2016-01-19 DIAGNOSIS — Z8711 Personal history of peptic ulcer disease: Secondary | ICD-10-CM | POA: Insufficient documentation

## 2016-01-19 DIAGNOSIS — I480 Paroxysmal atrial fibrillation: Secondary | ICD-10-CM | POA: Insufficient documentation

## 2016-01-19 DIAGNOSIS — I519 Heart disease, unspecified: Secondary | ICD-10-CM

## 2016-01-19 DIAGNOSIS — I5032 Chronic diastolic (congestive) heart failure: Secondary | ICD-10-CM | POA: Diagnosis not present

## 2016-01-19 DIAGNOSIS — K219 Gastro-esophageal reflux disease without esophagitis: Secondary | ICD-10-CM | POA: Insufficient documentation

## 2016-01-19 DIAGNOSIS — G4733 Obstructive sleep apnea (adult) (pediatric): Secondary | ICD-10-CM | POA: Diagnosis not present

## 2016-01-19 DIAGNOSIS — Z800 Family history of malignant neoplasm of digestive organs: Secondary | ICD-10-CM | POA: Insufficient documentation

## 2016-01-19 DIAGNOSIS — Z832 Family history of diseases of the blood and blood-forming organs and certain disorders involving the immune mechanism: Secondary | ICD-10-CM | POA: Insufficient documentation

## 2016-01-19 DIAGNOSIS — I2510 Atherosclerotic heart disease of native coronary artery without angina pectoris: Secondary | ICD-10-CM | POA: Diagnosis not present

## 2016-01-19 DIAGNOSIS — K449 Diaphragmatic hernia without obstruction or gangrene: Secondary | ICD-10-CM | POA: Insufficient documentation

## 2016-01-19 DIAGNOSIS — Z836 Family history of other diseases of the respiratory system: Secondary | ICD-10-CM | POA: Diagnosis not present

## 2016-01-19 DIAGNOSIS — J449 Chronic obstructive pulmonary disease, unspecified: Secondary | ICD-10-CM | POA: Insufficient documentation

## 2016-01-19 DIAGNOSIS — I110 Hypertensive heart disease with heart failure: Secondary | ICD-10-CM | POA: Diagnosis not present

## 2016-01-19 DIAGNOSIS — Z87891 Personal history of nicotine dependence: Secondary | ICD-10-CM | POA: Insufficient documentation

## 2016-01-19 DIAGNOSIS — I050 Rheumatic mitral stenosis: Secondary | ICD-10-CM | POA: Diagnosis not present

## 2016-01-19 DIAGNOSIS — I330 Acute and subacute infective endocarditis: Secondary | ICD-10-CM | POA: Insufficient documentation

## 2016-01-19 DIAGNOSIS — I4510 Unspecified right bundle-branch block: Secondary | ICD-10-CM | POA: Diagnosis not present

## 2016-01-19 DIAGNOSIS — I272 Other secondary pulmonary hypertension: Secondary | ICD-10-CM | POA: Insufficient documentation

## 2016-01-19 DIAGNOSIS — F418 Other specified anxiety disorders: Secondary | ICD-10-CM | POA: Insufficient documentation

## 2016-01-19 DIAGNOSIS — M1990 Unspecified osteoarthritis, unspecified site: Secondary | ICD-10-CM | POA: Diagnosis not present

## 2016-01-19 DIAGNOSIS — I059 Rheumatic mitral valve disease, unspecified: Secondary | ICD-10-CM | POA: Diagnosis not present

## 2016-01-19 DIAGNOSIS — Z7901 Long term (current) use of anticoagulants: Secondary | ICD-10-CM | POA: Insufficient documentation

## 2016-01-19 DIAGNOSIS — Z9981 Dependence on supplemental oxygen: Secondary | ICD-10-CM | POA: Insufficient documentation

## 2016-01-19 DIAGNOSIS — K047 Periapical abscess without sinus: Secondary | ICD-10-CM | POA: Insufficient documentation

## 2016-01-19 DIAGNOSIS — Z951 Presence of aortocoronary bypass graft: Secondary | ICD-10-CM | POA: Insufficient documentation

## 2016-01-19 DIAGNOSIS — E785 Hyperlipidemia, unspecified: Secondary | ICD-10-CM | POA: Diagnosis not present

## 2016-01-19 MED ORDER — TORSEMIDE 20 MG PO TABS: ORAL_TABLET | ORAL | Status: DC

## 2016-01-19 NOTE — Patient Instructions (Signed)
Increase Torsemide to 60mg  in the morning and 40mg  in the evening.  Your provider requests you have a lexiscan myoview next week. *Labs at [facility] appointment (bmet bnp)  Follow up with Dr.McLean in 2 weeks.

## 2016-01-19 NOTE — Progress Notes (Signed)
Patient ID: [name], female   DOB: October 18, 1940, 75 y.o.   MRN: [id number]    Advanced Heart Failure Clinic Note   PCP: Dr Valentina Lucks  Pulmonary: Dr Maple Hudson HF Cardiology: Dr. Shirlee Latch  HPI: [name] is a 75 y.o. female with a history of chronic diastolic CHF, moderate pulmonary HTN, HTN, CAD s/p CABG, COPD on home oxygen, and dyslipidemia referred to the HF clinic by Dr Mayford Knife.  She has developed worsening dyspnea with exertion over the last few months.   Admitted 3/2 - 09/30/15 with A/C CHF. TEE was done to assess mitral stenosis, appeared mild to moderate, rheumatic valve.  TEE also showed aortic valve vegetation and concern arose for endocarditis with recent "tooth infection" per pt.  ID saw. 1 of 2 admission cultures with Gram + cocci in clusters thought to be contaminant.  2 further sets of BCx negative. RHC showed elevated right and left heart filling pressures.  Aortic valve was not crossed for full mitral stenosis assessment due to aortic valve vegetation. Diuresed 8 lbs. Discharge weight 175 lbs.   She returns today for followup.  She is not doing as well as at last appointment.  She has been getting central chest tightness if she takes off her oxygen and tries to walk to the bathroom.  She does not get chest pain if she keeps oxygen on. She is short of breath walking 10-20 feet without oxygen and after walking about 40 feet with oxygen.  She sleeps on 2 pillows.  She has a chronic cough.    - ECHO 12/2014 EF 55-60%, peak PA pressure 68 mmHg, mild to moderate MS. - ECHO 2/17 EF 60-65%, mild LVH, mildly dilated RV with mildly decreased systolic function, PASP 29 mmHg (not sure we got a full envelope), moderate mitral stenosis with mean gradient 6 mmHg and MVA 1.37 cm^2, mild MR, mild AI, aortic sclerosis without significant stenosis.   - TEE 09/24/15 EF 60%, small 0.8 cm mobile mass on the LV side of the aortic valve. Mild AI and AS. 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 A small PFO with a weakly positive bubble study, Moderate TR  RHC 09/24/15 (did not do LHC and cross valve to assess mitral stenosis due to aortic valve vegetation): Hemodynamics (mmHg) RA mean 16 RV 65/14 PA 71/28, mean 45 PCWP mean 32 Cardiac Output (Fick) 4.85  Cardiac Index (Fick) 2.51 PVR 2.68 WU  ECG: NSR, RBBB  Labs 01/09/2015 K 4.3 Creatinine 0.91 BNP  Labs 02/19/2015 K 4.1 Creatinine 1.03 Labs 2/17 LDL 82 Labs 10/05/15 K 3.8, Creatinine 0.89  Labs 10/08/15 K 4.6, Creatinine 0.7 Labs 6/17 K 4.3, creatinine 0.95, HCT 32.7, BNP 221  Social History: The patient  reports that she quit smoking about 7 years ago. Her smoking use included Cigarettes. She has a 47 pack-year smoking history. She has never used smokeless tobacco. She reports that she does not drink alcohol or use illicit drugs.   Family History: The patient's family history includes Anemia in her mother; Emphysema in her father; Esophageal cancer in her daughter.   ROS: All systems negative except as listed in HPI, PMH and Problem List.  SH:  Social History   Social History  . Marital Status: Married    Spouse Name: N/A  . Number of Children: N/A  . Years of Education: N/A   Occupational History  . Not on file.   Social History Main  Topics  . Smoking status: Former Smoker -- 1.00 packs/day for 47 years    Types: Cigarettes    Quit date: 07/26/2007  . Smokeless tobacco: Never Used  . Alcohol Use: No  . Drug Use: No  . Sexual Activity: No   Other Topics Concern  . Not on file   Social History Narrative   Husband bilateral left amputee for atheorsclerotic disease    FH:  Family History  Problem Relation Age of Onset  . Anemia Mother   . Emphysema Father   . Esophageal cancer Daughter     Past Medical History  Diagnosis Date  . COPD (chronic obstructive pulmonary disease) (HCC)   . Pulmonary HTN (HCC)     PASP 50-16mmHg by [id number]  . History of rheumatic heart  disease     X 3-LAST TIME WHEN PATIENT WAS 75 YRS OLD  . Sleep apnea     intolerant to CPAP  . Hypertension   . Bursitis, shoulder     bilateral  . GERD (gastroesophageal reflux disease)   . H/O hiatal hernia   . Anxiety   . Depression   . Varicose veins   . PUD (peptic ulcer disease)   . 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 DJD (degenerative joint disease)   . OA (osteoarthritis)   . Hyperlipidemia   . Urinary incontinence   . Pulmonary HTN (HCC)   . Thyroid nodule   . CHF (congestive heart failure) (HCC)     chronic diasotlic CHF  . Complication of anesthesia     "have trouble getting me awake sometimes; been ok latelhy" (09/24/2015)  . On home oxygen therapy     "2L; 24/7" (09/24/2015)    Current Outpatient Prescriptions  Medication Sig Dispense Refill  . acetaminophen (TYLENOL) 500 MG tablet Take 1,000 mg by mouth daily as needed for headache.    . calcium-vitamin D (OSCAL WITH D) 500-200 MG-UNIT per tablet Take 2 tablets by mouth daily with breakfast.    . loratadine (CLARITIN) 10 MG tablet Take 10 mg by mouth at bedtime.     . metoprolol (LOPRESSOR) 50 MG tablet Take 1 tablet by mouth two  times daily 180 tablet 1  . nitroGLYCERIN (NITROSTAT) 0.4 MG SL tablet Place 1 tablet (0.4 mg total) under the tongue every 5 (five) minutes as needed for chest pain. 25 tablet 3  . omeprazole (PRILOSEC OTC) 20 MG tablet Take 20 mg by mouth at bedtime as needed (GERD).     . OXYGEN Inhale 2 L into the lungs continuous.    . potassium chloride SA (K-DUR,KLOR-CON) 20 MEQ tablet Take 2 tablets (40 mEq total) by mouth 2 (two) times daily. 360 tablet 3  . rivaroxaban (XARELTO) 20 MG TABS tablet Take 1 tablet (20 mg total) by mouth daily with supper. Please call and schedule a one year follow up appointment 90 tablet 0  . sertraline (ZOLOFT) 100 MG tablet Take 100 mg by mouth at bedtime.     . simvastatin (ZOCOR) 40 MG tablet Take 1 tablet (40 mg total) by mouth daily at 6 PM. Please call  and schedule a one year follow up appointment 90 tablet 0  . spironolactone (ALDACTONE) 25 MG tablet Take 1 tablet (25 mg total) by mouth daily. 30 tablet 6  . torsemide (DEMADEX) 20 MG tablet Take 60mg  in the morning and 40mg  in the evening 450 tablet 3  . alendronate (FOSAMAX) 70 MG tablet Take 70 mg  by mouth once a week. Reported on 01/19/2016  0   No current facility-administered medications for this encounter.    Filed Vitals:   01/19/16 1102  BP: 130/86  Pulse: 72  Height: 5\' 5"  (1.651 m)  Weight: 183 lb (83.008 kg)  SpO2: 97%   Wt Readings from Last 3 Encounters:  01/19/16 183 lb (83.008 kg)  01/05/16 181 lb (82.101 kg)  12/02/15 184 lb 3.2 oz (83.553 kg)     PHYSICAL EXAM:  General:  NAD, wearing oxygen. HEENT: normal Neck: supple. JVP 7-8 with mild HJR. Carotids 2+ bilaterally; no bruits. No thyromegaly or nodule noted.  Cor: PMI normal. RRR. No rubs, gallops.  2/6 SEM RUSB.  Lungs: Slight crackles left base. On 2 liters [place] Abdomen: soft, NT, ND, no HSM. No bruits or masses. +BS  Extremities: no cyanosis, clubbing, rash.  No edema.  Neuro: alert & orientedx3, cranial nerves grossly intact. Moves all 4 extremities w/o difficulty. Affect pleasant.  ASSESSMENT & PLAN: 1. Chronic diastolic CHF/valvular heart disease: TEE in 3/17 showed normal EF, mild to moderate mitral stenosis.  RHC showed elevated left and right heart filling pressures. She was diuresed and improved.  Today, NYHA IIIb symptoms.  She does not look markedly volume overloaded on exam and weight is stable, but she is more symptomatic. ?Ischemic equivalent (see below).  - I will increase torsemide incrementally to 60 qam/40 qpm.     - Continue 25 mg spironolactone daily.  - BMET/BNP today and at followup in 2 wks.   2. CAD S/P CABG: She has exertional chest pain when she takes off her oxygen, this is new.  Also increasing exertional dyspnea in absence of obvious volume overload on exam.  - Continue statin and  metoprolol.  - On Xarelto so no aspirin.  - I will arrange for Lexiscan Cardiolite for risk stratification.  3. Atrial fibrillation: Paroxysmal. CHADSVASC score is 4 (age > 24, female, HTN, CHF).  She remains in NSR.  - Continue Xarelto/metoprolol.  4. Pulmonary HTN: This is primarily pulmonary venous hypertension based on most recent RHC. 5. COPD: Followed by Dr Maple Hudson. On 2 liters [place].  6.  OSA: intolerant CPAP 7.  Mitral stenosis: Rheumatic mitral stenosis.  Mild to moderate mitral stenosis by TEE in 3/17.  Unable to assess by cath because she had aortic valve vegetation and the valve was not crossed. Treating medically for now.  Would be high risk for surgery with severe COPD.  However, possibly could be valvuloplasty candidate.    8. Aortic valve vegetation: Noted on TEE in 3/17 done to assess mitral stenosis.  No fever, blood cultures negative.  May have come from prior dental infection.  ID did not recommend antibiotics.   [name]  01/19/2016

## 2016-01-21 ENCOUNTER — Other Ambulatory Visit (HOSPITAL_COMMUNITY): Payer: Self-pay | Admitting: *Deleted

## 2016-01-21 ENCOUNTER — Other Ambulatory Visit: Payer: Self-pay | Admitting: Cardiology

## 2016-01-21 ENCOUNTER — Telehealth (HOSPITAL_COMMUNITY): Payer: Self-pay | Admitting: *Deleted

## 2016-01-21 MED ORDER — SIMVASTATIN 40 MG PO TABS: 40.0000 mg | ORAL_TABLET | Freq: Every day | ORAL | Status: DC

## 2016-01-21 NOTE — Telephone Encounter (Signed)
Patient given detailed instructions per Myocardial Perfusion Study Information Sheet for the test on 01/27/16 at 0745. Patient notified to arrive 15 minutes early and that it is imperative to arrive on time for appointment to keep from having the test rescheduled.  If you need to cancel or reschedule your appointment, please call the office within 24 hours of your appointment. Failure to do so may result in a cancellation of your appointment, and a $50 no show fee. Patient verbalized understanding.Yamilex Borgwardt, Adelene Idler

## 2016-01-27 ENCOUNTER — Other Ambulatory Visit (INDEPENDENT_AMBULATORY_CARE_PROVIDER_SITE_OTHER): Payer: Medicare Other | Admitting: *Deleted

## 2016-01-27 ENCOUNTER — Ambulatory Visit (HOSPITAL_COMMUNITY): Payer: Medicare Other | Attending: Cardiovascular Disease

## 2016-01-27 DIAGNOSIS — I5032 Chronic diastolic (congestive) heart failure: Secondary | ICD-10-CM

## 2016-01-27 DIAGNOSIS — I4510 Unspecified right bundle-branch block: Secondary | ICD-10-CM | POA: Diagnosis not present

## 2016-01-27 DIAGNOSIS — I110 Hypertensive heart disease with heart failure: Secondary | ICD-10-CM | POA: Insufficient documentation

## 2016-01-27 DIAGNOSIS — R0609 Other forms of dyspnea: Secondary | ICD-10-CM | POA: Insufficient documentation

## 2016-01-27 DIAGNOSIS — R9439 Abnormal result of other cardiovascular function study: Secondary | ICD-10-CM | POA: Diagnosis not present

## 2016-01-27 DIAGNOSIS — R079 Chest pain, unspecified: Secondary | ICD-10-CM | POA: Diagnosis not present

## 2016-01-27 DIAGNOSIS — I10 Essential (primary) hypertension: Secondary | ICD-10-CM

## 2016-01-27 DIAGNOSIS — I272 Other secondary pulmonary hypertension: Secondary | ICD-10-CM | POA: Diagnosis not present

## 2016-01-27 DIAGNOSIS — I519 Heart disease, unspecified: Secondary | ICD-10-CM | POA: Diagnosis not present

## 2016-01-27 DIAGNOSIS — I2510 Atherosclerotic heart disease of native coronary artery without angina pectoris: Secondary | ICD-10-CM

## 2016-01-27 LAB — BASIC METABOLIC PANEL
BUN: 22 mg/dL (ref 7–25)
CO2: 35 mmol/L — AB (ref 20–31)
Calcium: 9.6 mg/dL (ref 8.6–10.4)
Chloride: 95 mmol/L — ABNORMAL LOW (ref 98–110)
Creat: 1 mg/dL — ABNORMAL HIGH (ref 0.60–0.93)
GLUCOSE: 96 mg/dL (ref 65–99)
POTASSIUM: 4.2 mmol/L (ref 3.5–5.3)
Sodium: 140 mmol/L (ref 135–146)

## 2016-01-27 LAB — BRAIN NATRIURETIC PEPTIDE: Brain Natriuretic Peptide: 285.9 pg/mL — ABNORMAL HIGH (ref <100)

## 2016-01-27 LAB — CBC WITH DIFFERENTIAL/PLATELET
BASOS ABS: 0 {cells}/uL (ref 0–200)
Basophils Relative: 0 %
Eosinophils Absolute: 243 cells/uL (ref 15–500)
Eosinophils Relative: 3 %
HEMATOCRIT: 31.2 % — AB (ref 35.0–45.0)
HEMOGLOBIN: 9.3 g/dL — AB (ref 11.7–15.5)
LYMPHS ABS: 1701 {cells}/uL (ref 850–3900)
LYMPHS PCT: 21 %
MCH: 23.8 pg — AB (ref 27.0–33.0)
MCHC: 29.8 g/dL — AB (ref 32.0–36.0)
MCV: 79.8 fL — AB (ref 80.0–100.0)
MONO ABS: 972 {cells}/uL — AB (ref 200–950)
MPV: 10.6 fL (ref 7.5–12.5)
Monocytes Relative: 12 %
NEUTROS PCT: 64 %
Neutro Abs: 5184 cells/uL (ref 1500–7800)
Platelets: 242 10*3/uL (ref 140–400)
RBC: 3.91 MIL/uL (ref 3.80–5.10)
RDW: 15.4 % — AB (ref 11.0–15.0)
WBC: 8.1 10*3/uL (ref 3.8–10.8)

## 2016-01-27 LAB — MYOCARDIAL PERFUSION IMAGING
CHL CUP RESTING HR STRESS: 65 {beats}/min
CSEPPHR: 85 {beats}/min
NUC STRESS TID: 0.84
RATE: 0.29
SDS: 11
SRS: 7
SSS: 18

## 2016-01-27 MED ORDER — REGADENOSON 0.4 MG/5ML IV SOLN
0.4000 mg | Freq: Once | INTRAVENOUS | Status: AC
  Administered 2016-01-27: 0.4 mg via INTRAVENOUS

## 2016-01-27 MED ORDER — TECHNETIUM TC 99M TETROFOSMIN IV KIT
30.7000 | PACK | Freq: Once | INTRAVENOUS | Status: AC | PRN
  Administered 2016-01-27: 30.7 via INTRAVENOUS
  Filled 2016-01-27: qty 31

## 2016-01-27 MED ORDER — TECHNETIUM TC 99M TETROFOSMIN IV KIT
10.1000 | PACK | Freq: Once | INTRAVENOUS | Status: AC | PRN
  Administered 2016-01-27: 10.1 via INTRAVENOUS
  Filled 2016-01-27: qty 10

## 2016-01-27 NOTE — Addendum Note (Signed)
Addended by: Brittnee Gaetano K on: 01/27/2016 07:54 AM   Modules accepted: Orders  

## 2016-01-27 NOTE — Addendum Note (Signed)
Addended by: Tonita Phoenix on: 01/27/2016 07:54 AM   Modules accepted: Orders

## 2016-01-28 ENCOUNTER — Telehealth (HOSPITAL_COMMUNITY): Payer: Self-pay

## 2016-01-28 ENCOUNTER — Telehealth (HOSPITAL_COMMUNITY): Payer: Self-pay | Admitting: Vascular Surgery

## 2016-01-28 NOTE — Telephone Encounter (Signed)
Margie RN with [facility] called to recert patient to continue current. VO given per Shirlee Latch to continue current orders/care.  [name]

## 2016-01-28 NOTE — Telephone Encounter (Signed)
Pt called she needs to speak to you about coupons on her blood thinner.. Please advise

## 2016-01-29 ENCOUNTER — Other Ambulatory Visit (HOSPITAL_COMMUNITY): Payer: Self-pay | Admitting: *Deleted

## 2016-01-29 ENCOUNTER — Telehealth (HOSPITAL_COMMUNITY): Payer: Self-pay | Admitting: *Deleted

## 2016-01-29 DIAGNOSIS — R9439 Abnormal result of other cardiovascular function study: Secondary | ICD-10-CM

## 2016-01-29 DIAGNOSIS — I050 Rheumatic mitral stenosis: Secondary | ICD-10-CM

## 2016-01-29 NOTE — Telephone Encounter (Signed)
Pt's heart cath sch for 7/17 at 7:30, pt aware and instructions reviewed w/her including to hold Xarelto 2 days prior and day of.  Pt verbalizes understanding.

## 2016-02-01 ENCOUNTER — Encounter (HOSPITAL_COMMUNITY): Payer: Medicare Other

## 2016-02-08 ENCOUNTER — Encounter (HOSPITAL_COMMUNITY): Payer: Self-pay | Admitting: Cardiology

## 2016-02-08 ENCOUNTER — Ambulatory Visit (HOSPITAL_COMMUNITY)
Admission: RE | Admit: 2016-02-08 | Discharge: 2016-02-08 | Disposition: A | Discharge status: Home / Self Care | Payer: Medicare Other | Source: Ambulatory Visit | Attending: Cardiology | Admitting: Cardiology

## 2016-02-08 ENCOUNTER — Encounter (HOSPITAL_COMMUNITY)
Admission: RE | Disposition: A | Discharge status: Home / Self Care | Payer: Self-pay | Source: Ambulatory Visit | Attending: Cardiology

## 2016-02-08 DIAGNOSIS — I050 Rheumatic mitral stenosis: Secondary | ICD-10-CM | POA: Insufficient documentation

## 2016-02-08 DIAGNOSIS — Z87891 Personal history of nicotine dependence: Secondary | ICD-10-CM | POA: Diagnosis not present

## 2016-02-08 DIAGNOSIS — I2510 Atherosclerotic heart disease of native coronary artery without angina pectoris: Secondary | ICD-10-CM | POA: Diagnosis present

## 2016-02-08 DIAGNOSIS — J449 Chronic obstructive pulmonary disease, unspecified: Secondary | ICD-10-CM | POA: Insufficient documentation

## 2016-02-08 DIAGNOSIS — I442 Atrioventricular block, complete: Secondary | ICD-10-CM | POA: Diagnosis not present

## 2016-02-08 DIAGNOSIS — Z8711 Personal history of peptic ulcer disease: Secondary | ICD-10-CM | POA: Diagnosis not present

## 2016-02-08 DIAGNOSIS — I480 Paroxysmal atrial fibrillation: Secondary | ICD-10-CM | POA: Insufficient documentation

## 2016-02-08 DIAGNOSIS — Z9981 Dependence on supplemental oxygen: Secondary | ICD-10-CM | POA: Diagnosis not present

## 2016-02-08 DIAGNOSIS — G4733 Obstructive sleep apnea (adult) (pediatric): Secondary | ICD-10-CM | POA: Diagnosis not present

## 2016-02-08 DIAGNOSIS — F419 Anxiety disorder, unspecified: Secondary | ICD-10-CM | POA: Diagnosis not present

## 2016-02-08 DIAGNOSIS — I110 Hypertensive heart disease with heart failure: Secondary | ICD-10-CM | POA: Insufficient documentation

## 2016-02-08 DIAGNOSIS — K219 Gastro-esophageal reflux disease without esophagitis: Secondary | ICD-10-CM | POA: Insufficient documentation

## 2016-02-08 DIAGNOSIS — E785 Hyperlipidemia, unspecified: Secondary | ICD-10-CM | POA: Insufficient documentation

## 2016-02-08 DIAGNOSIS — I5032 Chronic diastolic (congestive) heart failure: Secondary | ICD-10-CM | POA: Diagnosis not present

## 2016-02-08 DIAGNOSIS — I272 Other secondary pulmonary hypertension: Secondary | ICD-10-CM | POA: Insufficient documentation

## 2016-02-08 DIAGNOSIS — Z7901 Long term (current) use of anticoagulants: Secondary | ICD-10-CM | POA: Insufficient documentation

## 2016-02-08 DIAGNOSIS — F329 Major depressive disorder, single episode, unspecified: Secondary | ICD-10-CM | POA: Diagnosis not present

## 2016-02-08 DIAGNOSIS — Z951 Presence of aortocoronary bypass graft: Secondary | ICD-10-CM | POA: Insufficient documentation

## 2016-02-08 DIAGNOSIS — M1990 Unspecified osteoarthritis, unspecified site: Secondary | ICD-10-CM | POA: Diagnosis not present

## 2016-02-08 DIAGNOSIS — R9439 Abnormal result of other cardiovascular function study: Secondary | ICD-10-CM

## 2016-02-08 HISTORY — PX: CARDIAC CATHETERIZATION: SHX172

## 2016-02-08 LAB — POCT I-STAT 3, VENOUS BLOOD GAS (G3P V)
Acid-Base Excess: 14 mmol/L — ABNORMAL HIGH (ref 0.0–2.0)
Bicarbonate: 41 mEq/L — ABNORMAL HIGH (ref 20.0–24.0)
O2 SAT: 60 %
PCO2 VEN: 69.8 mmHg — AB (ref 45.0–50.0)
TCO2: 43 mmol/L (ref 0–100)
pH, Ven: 7.377 — ABNORMAL HIGH (ref 7.250–7.300)
pO2, Ven: 34 mmHg (ref 31.0–45.0)

## 2016-02-08 LAB — POCT I-STAT 3, ART BLOOD GAS (G3+)
ACID-BASE EXCESS: 15 mmol/L — AB (ref 0.0–2.0)
Bicarbonate: 41.6 mEq/L — ABNORMAL HIGH (ref 20.0–24.0)
O2 Saturation: 99 %
PH ART: 7.415 (ref 7.350–7.450)
PO2 ART: 128 mmHg — AB (ref 80.0–100.0)
TCO2: 44 mmol/L (ref 0–100)
pCO2 arterial: 64.8 mmHg (ref 35.0–45.0)

## 2016-02-08 LAB — PROTIME-INR
INR: 0.99 (ref 0.00–1.49)
PROTHROMBIN TIME: 13.3 s (ref 11.6–15.2)

## 2016-02-08 SURGERY — RIGHT/LEFT HEART CATH AND CORONARY/GRAFT ANGIOGRAPHY
Anesthesia: LOCAL

## 2016-02-08 MED ORDER — LIDOCAINE HCL (PF) 1 % IJ SOLN
INTRAMUSCULAR | Status: DC | PRN
  Administered 2016-02-08: 30 mL via INTRADERMAL

## 2016-02-08 MED ORDER — ASPIRIN 81 MG PO CHEW
81.0000 mg | CHEWABLE_TABLET | ORAL | Status: AC
  Administered 2016-02-08: 81 mg via ORAL

## 2016-02-08 MED ORDER — HEPARIN (PORCINE) IN NACL 2-0.9 UNIT/ML-% IJ SOLN
INTRAMUSCULAR | Status: AC
  Filled 2016-02-08: qty 1000

## 2016-02-08 MED ORDER — FENTANYL CITRATE (PF) 100 MCG/2ML IJ SOLN
INTRAMUSCULAR | Status: DC | PRN
  Administered 2016-02-08: 25 ug via INTRAVENOUS

## 2016-02-08 MED ORDER — HEPARIN (PORCINE) IN NACL 2-0.9 UNIT/ML-% IJ SOLN
INTRAMUSCULAR | Status: DC | PRN
  Administered 2016-02-08: 1500 mL

## 2016-02-08 MED ORDER — SODIUM CHLORIDE 0.9% FLUSH: 3.0000 mL | Freq: Two times a day (BID) | INTRAVENOUS | Status: DC

## 2016-02-08 MED ORDER — SODIUM CHLORIDE 0.9 % WEIGHT BASED INFUSION: 1.0000 mL/kg/h | INTRAVENOUS | Status: AC

## 2016-02-08 MED ORDER — SODIUM CHLORIDE 0.9 % IV SOLN: 250.0000 mL | INTRAVENOUS | Status: DC | PRN

## 2016-02-08 MED ORDER — ASPIRIN 81 MG PO CHEW
CHEWABLE_TABLET | ORAL | Status: AC
  Filled 2016-02-08: qty 1

## 2016-02-08 MED ORDER — IOPAMIDOL (ISOVUE-370) INJECTION 76%
INTRAVENOUS | Status: DC | PRN
  Administered 2016-02-08: 125 mL

## 2016-02-08 MED ORDER — SODIUM CHLORIDE 0.9% FLUSH: 3.0000 mL | INTRAVENOUS | Status: DC | PRN

## 2016-02-08 MED ORDER — ACETAMINOPHEN 325 MG PO TABS: 650.0000 mg | ORAL_TABLET | ORAL | Status: DC | PRN

## 2016-02-08 MED ORDER — MIDAZOLAM HCL 2 MG/2ML IJ SOLN
INTRAMUSCULAR | Status: AC
  Filled 2016-02-08: qty 2

## 2016-02-08 MED ORDER — SODIUM CHLORIDE 0.9 % IV SOLN
INTRAVENOUS | Status: DC
  Administered 2016-02-08: 06:00:00 via INTRAVENOUS

## 2016-02-08 MED ORDER — FENTANYL CITRATE (PF) 100 MCG/2ML IJ SOLN
INTRAMUSCULAR | Status: AC
  Filled 2016-02-08: qty 2

## 2016-02-08 MED ORDER — ONDANSETRON HCL 4 MG/2ML IJ SOLN: 4.0000 mg | Freq: Four times a day (QID) | INTRAMUSCULAR | Status: DC | PRN

## 2016-02-08 MED ORDER — IOPAMIDOL (ISOVUE-370) INJECTION 76%
INTRAVENOUS | Status: AC
  Filled 2016-02-08: qty 125

## 2016-02-08 MED ORDER — MIDAZOLAM HCL 2 MG/2ML IJ SOLN
INTRAMUSCULAR | Status: DC | PRN
  Administered 2016-02-08: 1 mg via INTRAVENOUS

## 2016-02-08 MED ORDER — LIDOCAINE HCL (PF) 1 % IJ SOLN
INTRAMUSCULAR | Status: AC
  Filled 2016-02-08: qty 30

## 2016-02-08 SURGICAL SUPPLY — 9 items
CATH INFINITI 5FR MULTPACK ANG (CATHETERS) ×2 IMPLANT
CATH SWAN GANZ 7F STRAIGHT (CATHETERS) ×2 IMPLANT
KIT HEART LEFT (KITS) ×2 IMPLANT
KIT HEART RIGHT NAMIC (KITS) ×2 IMPLANT
PACK CARDIAC CATHETERIZATION (CUSTOM PROCEDURE TRAY) ×2 IMPLANT
SHEATH PINNACLE 5F 10CM (SHEATH) ×2 IMPLANT
SHEATH PINNACLE 7F 10CM (SHEATH) ×2 IMPLANT
TRANSDUCER W/STOPCOCK (MISCELLANEOUS) ×4 IMPLANT
WIRE EMERALD 3MM-J .035X150CM (WIRE) ×2 IMPLANT

## 2016-02-08 NOTE — Interval H&P Note (Signed)
Cath Lab Visit (complete for each Cath Lab visit)  Clinical Evaluation Leading to the Procedure:   ACS: No.  Non-ACS:    Anginal Classification: CCS III  Anti-ischemic medical therapy: Minimal Therapy (1 class of medications)  Non-Invasive Test Results: Intermediate-risk stress test findings: cardiac mortality 1-3%/year  Prior CABG: Previous CABG      History and Physical Interval Note:  02/08/2016 7:57 AM  [name]  has presented today for surgery, with the diagnosis of Abnormal Stress Test, Mitral Stenosis  The various methods of treatment have been discussed with the patient and family. After consideration of risks, benefits and other options for treatment, the patient has consented to  Procedure(s): Right/Left Heart Cath and Coronary/Graft Angiography (N/A) as a surgical intervention .  The patient's history has been reviewed, patient examined, no change in status, stable for surgery.  I have reviewed the patient's chart and labs.  Questions were answered to the patient's satisfaction.     Dewie Ahart [employer]

## 2016-02-08 NOTE — Progress Notes (Signed)
Site area: RFV / RFA Site Prior to Removal:  Level 0 Pressure Applied For:30 min Manual: yes   Patient Status During Pull: stable  Post Pull Site:  Level 0 Post Pull Instructions Given: yes  Post Pull Pulses Present: palpable Dressing Applied:  tegaderm Bedrest begins @ 1031 [id number] Comments:

## 2016-02-08 NOTE — Discharge Instructions (Signed)
°  MAY RESUME XARELTO TOMORROW MORNING  Angiogram, Care After These instructions give you information about caring for yourself after your procedure. Your doctor may also give you more specific instructions. Call your doctor if you have any problems or questions after your procedure.  HOME CARE  Take medicines only as told by your doctor.  Follow your doctor's instructions about:  Care of the area where the tube was inserted.  Bandage (dressing) changes and removal.  You may shower 24-48 hours after the procedure or as told by your doctor.  Do not take baths, swim, or use a hot tub until your doctor approves.  Every day, check the area where the tube was inserted. Watch for:  Redness, swelling, or pain.  Fluid, blood, or pus.  Do not apply powder or lotion to the site.  Do not lift anything that is heavier than 10 lb (4.5 kg) for 5 days or as told by your doctor.  Ask your doctor when you can:  Return to work or school.  Do physical activities or play sports.  Have sex.  Do not drive or operate heavy machinery for 24 hours or as told by your doctor.  Have someone with you for the first 24 hours after the procedure.  Keep all follow-up visits as told by your doctor. This is important. GET HELP IF:  You have a fever.   You have chills.   You have more bleeding from the area where the tube was inserted. Hold pressure on the area.  You have redness, swelling, or pain in the area where the tube was inserted.  You have fluid or pus coming from the area. GET HELP RIGHT AWAY IF:   You have a lot of pain in the area where the tube was inserted.  The area where the tube was inserted is bleeding, and the bleeding does not stop after 30 minutes of holding steady pressure on the area.  The area near or just beyond the insertion site becomes pale, cool, tingly, or numb.   This information is not intended to replace advice given to you by your health care provider. Make  sure you discuss any questions you have with your health care provider.   Document Released: 10/07/2008 Document Revised: 08/01/2014 Document Reviewed: 12/12/2012 Elsevier Interactive Patient Education [employer]

## 2016-02-08 NOTE — H&P (View-Only) (Signed)
Patient ID: [name], female   DOB: October 18, 1940, 75 y.o.   MRN: [id number]    Advanced Heart Failure Clinic Note   PCP: Dr Valentina Lucks  Pulmonary: Dr Maple Hudson HF Cardiology: Dr. Shirlee Latch  HPI: [name] is a 75 y.o. female with a history of chronic diastolic CHF, moderate pulmonary HTN, HTN, CAD s/p CABG, COPD on home oxygen, and dyslipidemia referred to the HF clinic by Dr Mayford Knife.  She has developed worsening dyspnea with exertion over the last few months.   Admitted 3/2 - 09/30/15 with A/C CHF. TEE was done to assess mitral stenosis, appeared mild to moderate, rheumatic valve.  TEE also showed aortic valve vegetation and concern arose for endocarditis with recent "tooth infection" per pt.  ID saw. 1 of 2 admission cultures with Gram + cocci in clusters thought to be contaminant.  2 further sets of BCx negative. RHC showed elevated right and left heart filling pressures.  Aortic valve was not crossed for full mitral stenosis assessment due to aortic valve vegetation. Diuresed 8 lbs. Discharge weight 175 lbs.   She returns today for followup.  She is not doing as well as at last appointment.  She has been getting central chest tightness if she takes off her oxygen and tries to walk to the bathroom.  She does not get chest pain if she keeps oxygen on. She is short of breath walking 10-20 feet without oxygen and after walking about 40 feet with oxygen.  She sleeps on 2 pillows.  She has a chronic cough.    - ECHO 12/2014 EF 55-60%, peak PA pressure 68 mmHg, mild to moderate MS. - ECHO 2/17 EF 60-65%, mild LVH, mildly dilated RV with mildly decreased systolic function, PASP 29 mmHg (not sure we got a full envelope), moderate mitral stenosis with mean gradient 6 mmHg and MVA 1.37 cm^2, mild MR, mild AI, aortic sclerosis without significant stenosis.   - TEE 09/24/15 EF 60%, small 0.8 cm mobile mass on the LV side of the aortic valve. Mild AI and AS. 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 A small PFO with a weakly positive bubble study, Moderate TR  RHC 09/24/15 (did not do LHC and cross valve to assess mitral stenosis due to aortic valve vegetation): Hemodynamics (mmHg) RA mean 16 RV 65/14 PA 71/28, mean 45 PCWP mean 32 Cardiac Output (Fick) 4.85  Cardiac Index (Fick) 2.51 PVR 2.68 WU  ECG: NSR, RBBB  Labs 01/09/2015 K 4.3 Creatinine 0.91 BNP  Labs 02/19/2015 K 4.1 Creatinine 1.03 Labs 2/17 LDL 82 Labs 10/05/15 K 3.8, Creatinine 0.89  Labs 10/08/15 K 4.6, Creatinine 0.7 Labs 6/17 K 4.3, creatinine 0.95, HCT 32.7, BNP 221  Social History: The patient  reports that she quit smoking about 7 years ago. Her smoking use included Cigarettes. She has a 47 pack-year smoking history. She has never used smokeless tobacco. She reports that she does not drink alcohol or use illicit drugs.   Family History: The patient's family history includes Anemia in her mother; Emphysema in her father; Esophageal cancer in her daughter.   ROS: All systems negative except as listed in HPI, PMH and Problem List.  SH:  Social History   Social History  . Marital Status: Married    Spouse Name: N/A  . Number of Children: N/A  . Years of Education: N/A   Occupational History  . Not on file.   Social History Main  Topics  . Smoking status: Former Smoker -- 1.00 packs/day for 47 years    Types: Cigarettes    Quit date: 07/26/2007  . Smokeless tobacco: Never Used  . Alcohol Use: No  . Drug Use: No  . Sexual Activity: No   Other Topics Concern  . Not on file   Social History Narrative   Husband bilateral left amputee for atheorsclerotic disease    FH:  Family History  Problem Relation Age of Onset  . Anemia Mother   . Emphysema Father   . Esophageal cancer Daughter     Past Medical History  Diagnosis Date  . COPD (chronic obstructive pulmonary disease) (HCC)   . Pulmonary HTN (HCC)     PASP 50-16mmHg by [id number]  . History of rheumatic heart  disease     X 3-LAST TIME WHEN PATIENT WAS 75 YRS OLD  . Sleep apnea     intolerant to CPAP  . Hypertension   . Bursitis, shoulder     bilateral  . GERD (gastroesophageal reflux disease)   . H/O hiatal hernia   . Anxiety   . Depression   . Varicose veins   . PUD (peptic ulcer disease)   . 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 DJD (degenerative joint disease)   . OA (osteoarthritis)   . Hyperlipidemia   . Urinary incontinence   . Pulmonary HTN (HCC)   . Thyroid nodule   . CHF (congestive heart failure) (HCC)     chronic diasotlic CHF  . Complication of anesthesia     "have trouble getting me awake sometimes; been ok latelhy" (09/24/2015)  . On home oxygen therapy     "2L; 24/7" (09/24/2015)    Current Outpatient Prescriptions  Medication Sig Dispense Refill  . acetaminophen (TYLENOL) 500 MG tablet Take 1,000 mg by mouth daily as needed for headache.    . calcium-vitamin D (OSCAL WITH D) 500-200 MG-UNIT per tablet Take 2 tablets by mouth daily with breakfast.    . loratadine (CLARITIN) 10 MG tablet Take 10 mg by mouth at bedtime.     . metoprolol (LOPRESSOR) 50 MG tablet Take 1 tablet by mouth two  times daily 180 tablet 1  . nitroGLYCERIN (NITROSTAT) 0.4 MG SL tablet Place 1 tablet (0.4 mg total) under the tongue every 5 (five) minutes as needed for chest pain. 25 tablet 3  . omeprazole (PRILOSEC OTC) 20 MG tablet Take 20 mg by mouth at bedtime as needed (GERD).     . OXYGEN Inhale 2 L into the lungs continuous.    . potassium chloride SA (K-DUR,KLOR-CON) 20 MEQ tablet Take 2 tablets (40 mEq total) by mouth 2 (two) times daily. 360 tablet 3  . rivaroxaban (XARELTO) 20 MG TABS tablet Take 1 tablet (20 mg total) by mouth daily with supper. Please call and schedule a one year follow up appointment 90 tablet 0  . sertraline (ZOLOFT) 100 MG tablet Take 100 mg by mouth at bedtime.     . simvastatin (ZOCOR) 40 MG tablet Take 1 tablet (40 mg total) by mouth daily at 6 PM. Please call  and schedule a one year follow up appointment 90 tablet 0  . spironolactone (ALDACTONE) 25 MG tablet Take 1 tablet (25 mg total) by mouth daily. 30 tablet 6  . torsemide (DEMADEX) 20 MG tablet Take 60mg  in the morning and 40mg  in the evening 450 tablet 3  . alendronate (FOSAMAX) 70 MG tablet Take 70 mg  by mouth once a week. Reported on 01/19/2016  0   No current facility-administered medications for this encounter.    Filed Vitals:   01/19/16 1102  BP: 130/86  Pulse: 72  Height: 5\' 5"  (1.651 m)  Weight: 183 lb (83.008 kg)  SpO2: 97%   Wt Readings from Last 3 Encounters:  01/19/16 183 lb (83.008 kg)  01/05/16 181 lb (82.101 kg)  12/02/15 184 lb 3.2 oz (83.553 kg)     PHYSICAL EXAM:  General:  NAD, wearing oxygen. HEENT: normal Neck: supple. JVP 7-8 with mild HJR. Carotids 2+ bilaterally; no bruits. No thyromegaly or nodule noted.  Cor: PMI normal. RRR. No rubs, gallops.  2/6 SEM RUSB.  Lungs: Slight crackles left base. On 2 liters [place] Abdomen: soft, NT, ND, no HSM. No bruits or masses. +BS  Extremities: no cyanosis, clubbing, rash.  No edema.  Neuro: alert & orientedx3, cranial nerves grossly intact. Moves all 4 extremities w/o difficulty. Affect pleasant.  ASSESSMENT & PLAN: 1. Chronic diastolic CHF/valvular heart disease: TEE in 3/17 showed normal EF, mild to moderate mitral stenosis.  RHC showed elevated left and right heart filling pressures. She was diuresed and improved.  Today, NYHA IIIb symptoms.  She does not look markedly volume overloaded on exam and weight is stable, but she is more symptomatic. ?Ischemic equivalent (see below).  - I will increase torsemide incrementally to 60 qam/40 qpm.     - Continue 25 mg spironolactone daily.  - BMET/BNP today and at followup in 2 wks.   2. CAD S/P CABG: She has exertional chest pain when she takes off her oxygen, this is new.  Also increasing exertional dyspnea in absence of obvious volume overload on exam.  - Continue statin and  metoprolol.  - On Xarelto so no aspirin.  - I will arrange for Lexiscan Cardiolite for risk stratification.  3. Atrial fibrillation: Paroxysmal. CHADSVASC score is 4 (age > 24, female, HTN, CHF).  She remains in NSR.  - Continue Xarelto/metoprolol.  4. Pulmonary HTN: This is primarily pulmonary venous hypertension based on most recent RHC. 5. COPD: Followed by Dr Maple Hudson. On 2 liters [place].  6.  OSA: intolerant CPAP 7.  Mitral stenosis: Rheumatic mitral stenosis.  Mild to moderate mitral stenosis by TEE in 3/17.  Unable to assess by cath because she had aortic valve vegetation and the valve was not crossed. Treating medically for now.  Would be high risk for surgery with severe COPD.  However, possibly could be valvuloplasty candidate.    8. Aortic valve vegetation: Noted on TEE in 3/17 done to assess mitral stenosis.  No fever, blood cultures negative.  May have come from prior dental infection.  ID did not recommend antibiotics.   Marca Ancona  01/19/2016

## 2016-02-17 ENCOUNTER — Telehealth (HOSPITAL_COMMUNITY): Payer: Self-pay

## 2016-02-17 NOTE — Telephone Encounter (Signed)
Patient called to speak to [facility] CHF clinical pharmacist concerning questions about xarelto.  Will forward to her to call back.  [name]

## 2016-02-19 NOTE — Telephone Encounter (Signed)
Xarelto patient assistance still in process. Will provide Ms. Weiss with samples in the meantime.   Medication Samples have been provided to the patient.  Drug name: Xarelto       Strength: 20 mg        Qty: 14  LOT: 16MG 053, [id number]  Exp.Date: 08/19, 09/19  Dosing instructions: Take 1 tablet daily   The patient has been instructed regarding the correct time, dose, and frequency of taking this medication, including desired effects and most common side effects.   Elvin So 9:23 AM 02/19/2016

## 2016-02-22 ENCOUNTER — Ambulatory Visit (HOSPITAL_BASED_OUTPATIENT_CLINIC_OR_DEPARTMENT_OTHER): Payer: Medicare Other | Attending: Adult Health | Admitting: Internal Medicine

## 2016-02-22 VITALS — Ht 66.0 in | Wt 180.0 lb

## 2016-02-22 DIAGNOSIS — G4761 Periodic limb movement disorder: Secondary | ICD-10-CM | POA: Diagnosis not present

## 2016-02-22 DIAGNOSIS — J449 Chronic obstructive pulmonary disease, unspecified: Secondary | ICD-10-CM | POA: Diagnosis not present

## 2016-02-22 DIAGNOSIS — G4733 Obstructive sleep apnea (adult) (pediatric): Secondary | ICD-10-CM | POA: Diagnosis not present

## 2016-02-22 DIAGNOSIS — I10 Essential (primary) hypertension: Secondary | ICD-10-CM | POA: Diagnosis not present

## 2016-02-22 DIAGNOSIS — R5383 Other fatigue: Secondary | ICD-10-CM | POA: Insufficient documentation

## 2016-02-22 DIAGNOSIS — R0683 Snoring: Secondary | ICD-10-CM | POA: Diagnosis not present

## 2016-02-22 DIAGNOSIS — R51 Headache: Secondary | ICD-10-CM | POA: Diagnosis not present

## 2016-02-22 DIAGNOSIS — G4700 Insomnia, unspecified: Secondary | ICD-10-CM | POA: Diagnosis not present

## 2016-02-22 DIAGNOSIS — G4719 Other hypersomnia: Secondary | ICD-10-CM | POA: Diagnosis not present

## 2016-02-28 DIAGNOSIS — G4733 Obstructive sleep apnea (adult) (pediatric): Secondary | ICD-10-CM

## 2016-02-28 NOTE — Procedures (Signed)
  Patient Name: [name], [name] Date: 02/22/2016 Gender: Female D.O.B: [birth year] Age (years): 25 Referring Provider: Babette Relic Parrett Height (inches): 66 Interpreting Physician: Jetty Duhamel MD, ABSM Weight (lbs): 180 RPSGT: Celene Kras BMI: 29 MRN: [id number] Neck Size: 14.00 CLINICAL INFORMATION Sleep Study Type: NPSG   Indication for sleep study: COPD, Excessive Daytime Sleepiness, Fatigue, Hypertension, Morning Headaches, OSA   Epworth Sleepiness Score:  8/24   SLEEP STUDY TECHNIQUE As per the AASM Manual for the Scoring of Sleep and Associated Events v2.3 (April 2016) with a hypopnea requiring 4% desaturations. The channels recorded and monitored were frontal, central and occipital EEG, electrooculogram (EOG), submentalis EMG (chin), nasal and oral airflow, thoracic and abdominal wall motion, anterior tibialis EMG, snore microphone, electrocardiogram, and pulse oximetry.  MEDICATIONS Patient's medications include: charted for review. Medications self-administered by patient during sleep study : No sleep medicine administered.  SLEEP ARCHITECTURE The study was initiated at 10:45:31 PM and ended at 4:47:47 AM. Sleep onset time was 4.3 minutes and the sleep efficiency was 49.0%. The total sleep time was 177.4 minutes. Stage REM latency was N/A minutes. The patient spent 10.71% of the night in stage N1 sleep, 89.29% in stage N2 sleep, 0.00% in stage N3 and 0.00% in REM. Alpha intrusion was absent. Supine sleep was 29.93%. Wake after sleep onset 180 minutes  RESPIRATORY PARAMETERS The overall apnea/hypopnea index (AHI) was 0.0 per hour. There were 0 total apneas, including 0 obstructive, 0 central and 0 mixed apneas. There were 0 hypopneas and 1 RERAs. The AHI during Stage REM sleep was N/A per hour. AHI while supine was 0.0 per hour. The mean oxygen saturation was 97.84%. The minimum SpO2 during sleep was 94.00%. Continued home O2 2L/ min. Soft snoring was noted  during this study.  CARDIAC DATA The 2 lead EKG demonstrated sinus rhythm. The mean heart rate was 58.07 beats per minute. Other EKG findings include: None.  LEG MOVEMENT DATA The total PLMS were 556 with a resulting PLMS index of 188.01. Associated arousal with leg movement index was 36.2 .  IMPRESSIONS - No significant obstructive sleep apnea occurred during this study (AHI = 0.0/h). - No significant central sleep apnea occurred during this study (CAI = 0.0/h). - The patient had minimal or no oxygen desaturation during the study (Min O2 = 94.00%), using her home O2 setting of 2L/min through study. - The patient snored with Soft snoring volume. - No cardiac abnormalities were noted during this study. - Severe periodic limb movements of sleep occurred during the study. Associated arousals were significant.  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 - Avoid alcohol, sedatives and other CNS depressants that may worsen sleep apnea and disrupt normal sleep architecture. - Sleep hygiene should be reviewed to assess factors that may improve sleep quality. - Weight management and regular exercise should be initiated or continued if appropriate.  [Electronically signed] 02/28/2016 03:05 PM  Jetty Duhamel MD, ABSM Diplomate, American Board of Sleep Medicine   NPI: [id number]  Waymon Budge Diplomate, American Board of Sleep Medicine  ELECTRONICALLY SIGNED ON:  02/28/2016, 2:57 PM [facility] PH: [phone]   FX: [phone] ACCREDITED BY THE AMERICAN ACADEMY OF SLEEP MEDICINE

## 2016-02-29 ENCOUNTER — Encounter: Payer: Self-pay | Admitting: Acute Care

## 2016-02-29 ENCOUNTER — Telehealth: Payer: Self-pay | Admitting: Acute Care

## 2016-02-29 ENCOUNTER — Telehealth (HOSPITAL_COMMUNITY): Payer: Self-pay

## 2016-02-29 ENCOUNTER — Telehealth (HOSPITAL_COMMUNITY): Payer: Self-pay | Admitting: Pharmacist

## 2016-02-29 DIAGNOSIS — G4733 Obstructive sleep apnea (adult) (pediatric): Secondary | ICD-10-CM

## 2016-02-29 MED ORDER — RIVAROXABAN 20 MG PO TABS: 20.0000 mg | ORAL_TABLET | Freq: Every day | ORAL | 3 refills | Status: DC

## 2016-02-29 NOTE — Telephone Encounter (Signed)
[employer] patient assistance TEMPORARILY approved for Xarelto through 02/03/17. Patient needs to apply for LIS through Medicare and if denied needs to send copy of denial to [employer] to continue to receive through patient assistance. Patient and [facility] pharmacy notified.    Tyler Deis. [name], PharmD, BCPS, CPP Clinical Pharmacist Pager: [phone] Phone: [phone] 02/29/2016 4:08 PM

## 2016-02-29 NOTE — Telephone Encounter (Signed)
Ok

## 2016-02-29 NOTE — Telephone Encounter (Signed)
Called spoke with pt. Reviewed results and recs. She is requesting an ov sooner then 04/18/16 with CY. Due to SOB and questions concerning her sleep study. I scheduled her for 03/10/16 with TP. She voiced understanding and had no further questions.

## 2016-02-29 NOTE — Telephone Encounter (Signed)
Sleep study showed no sign of sleep apnea.  It did show restless leg syndrome , can discus at follow up ov regarding treatment for this.  May sure she has follow up .  She is to continue on O2 at bedtime.   Study results.  No significant obstructive sleep apnea occurred during this study (AHI = 0.0/h). - No significant central sleep apnea occurred during this study (CAI = 0.0/h). - The patient had minimal or no oxygen desaturation during the study (Min O2 = 94.00%), using her home O2 setting of 2L/min through study. - The patient snored with Soft snoring volume. - No cardiac abnormalities were noted during this study. - Severe periodic limb movements of sleep occurred during the study. Associated arousals were significant.

## 2016-02-29 NOTE — Telephone Encounter (Signed)
Patient had sleep study done on 02/22/16 and wants to know the results.  Epic is showing Split Night Study (Active - In process) Advised patient that results have not been received yet and we would contact her as soon as we find out something.   Dr. Maple Hudson ,please advise.

## 2016-02-29 NOTE — Telephone Encounter (Signed)
Patient calling CHF clinic triage to talk to CHF clinical pharmacist Cicero Duck about getting enrolled in [employer] PAP (received letter in the mail stating she was eligible). Will forward.  [name] RN

## 2016-03-10 ENCOUNTER — Encounter: Payer: Self-pay | Admitting: Adult Health

## 2016-03-10 ENCOUNTER — Ambulatory Visit (INDEPENDENT_AMBULATORY_CARE_PROVIDER_SITE_OTHER): Payer: Medicare Other | Admitting: Adult Health

## 2016-03-10 DIAGNOSIS — J449 Chronic obstructive pulmonary disease, unspecified: Secondary | ICD-10-CM

## 2016-03-10 DIAGNOSIS — G4733 Obstructive sleep apnea (adult) (pediatric): Secondary | ICD-10-CM | POA: Diagnosis not present

## 2016-03-10 DIAGNOSIS — I272 Other secondary pulmonary hypertension: Secondary | ICD-10-CM

## 2016-03-10 DIAGNOSIS — I5032 Chronic diastolic (congestive) heart failure: Secondary | ICD-10-CM

## 2016-03-10 MED ORDER — UMECLIDINIUM-VILANTEROL 62.5-25 MCG/INH IN AEPB: 1.0000 | INHALATION_SPRAY | Freq: Every day | RESPIRATORY_TRACT | 5 refills | Status: DC

## 2016-03-10 NOTE — Assessment & Plan Note (Signed)
No sign OSA on sleep study  Cont on O2 at At bedtime

## 2016-03-10 NOTE — Assessment & Plan Note (Signed)
Cont with follow up with Cardiology

## 2016-03-10 NOTE — Progress Notes (Signed)
Subjective:    Patient ID: [name], female    DOB: 1941/07/24, 75 y.o.   MRN: [id number]  HPI 75 yo former smoker with COPD, sleep apnea, complicated by pulmonary HTN, on O2  Followed by cards for Valvular heart dz, A Fib , Pulm HTN, CAD   TEST  - ECHO 12/2014 EF 55-60%, peak PA pressure 68 mmHg, mild to moderate MS. - ECHO 2/17 EF 60-65%, mild LVH, mildly dilated RV with mildly decreased systolic function, PASP 29 mmHg (not sure we got a full envelope), moderate mitral stenosis with mean gradient 6 mmHg and MVA 1.37 cm^2, mild MR, mild AI, aortic sclerosis without significant stenosis.   - TEE 09/24/15 EF 60%, small 0.8 cm mobile mass on the LV side of the aortic valve. Mild AI and AS. 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 A small PFO with a weakly positive bubble study, Moderate TR -Pulmonary function test 09/18/2015 showed severe COPD with FEV1 at 56%, ratio 72, FVC 56%, DLCO 96% -Sleep study on 02/22/16 showed no OSA with AHI 0. Severe PLM .  03/10/2016 Follow up : OSA /COPD /Pulmonary HTN, Pt presents for 2 month follow up . She has a complicated medical history .  Previously dx with sleep apnea years ago, was not able to wear CPAP . NPSG/ [facility]- 04/02/2008 mild OSA AHI 9.4 per hour Set up for sleep study on 02/22/16. That did not show OSA.  Sleep study on 02/22/16 showed no OSA with AHI 0. Severe PLM  . She has a heavy smoking hx with mod to severe COPD on PFT  Tried prednisone, albuterol inhaler and nebs in past  but very intilerant.  We discussed trying new inhaler to see if this would help her breathing.   She remains on O2 2l/m . Denies LE swelling . Remains on demadex and aldactone.    Past Medical History:  Diagnosis Date  . Anxiety   . Bursitis, shoulder    bilateral  . CHF (congestive heart failure) (HCC)    chronic diasotlic CHF  . Complication of anesthesia    "have trouble getting me awake sometimes; been ok  latelhy" (09/24/2015)  . COPD (chronic obstructive pulmonary disease) (HCC)   . Coronary heart disease 2001   s/p CABG  . Depression   . DJD (degenerative joint disease)   . GERD (gastroesophageal reflux disease)   . H/O hiatal hernia   . History of rheumatic heart disease    X 3-LAST TIME WHEN PATIENT WAS 75 YRS OLD  . Hyperlipidemia   . Hypertension   . OA (osteoarthritis)   . On home oxygen therapy    "2L; 24/7" (09/24/2015)  . PUD (peptic ulcer disease)   . Pulmonary HTN (HCC)    PASP 50-5755mmHg by [id number]/2014  . Pulmonary HTN (HCC)   . Sleep apnea    intolerant to CPAP  . Thyroid nodule   . Urinary incontinence   . Varicose veins    Current Outpatient Prescriptions on File Prior to Visit  Medication Sig Dispense Refill  . acetaminophen (TYLENOL) 500 MG tablet Take 1,000 mg by mouth 2 (two) times daily as needed for headache.     . calcium-vitamin D (OSCAL WITH D) 500-200 MG-UNIT per tablet Take 2 tablets by mouth daily with breakfast.    . loratadine (CLARITIN) 10 MG tablet Take 10 mg by mouth at bedtime as needed for allergies.     .Marland Kitchen  metoprolol (LOPRESSOR) 50 MG tablet Take 1 tablet by mouth two  times daily (Patient taking differently: Take 1 tablet (50 mg) by mouth two  times daily) 180 tablet 1  . NITROSTAT 0.4 MG SL tablet place 1 tablet under the tongue if needed every 5 minutes for chest pain for 3 doses IF NO RELIEF AFTER 3RD DOSE CALL PRESCRIBER OR 911. 25 tablet 0  . omeprazole (PRILOSEC OTC) 20 MG tablet Take 20 mg by mouth at bedtime as needed (GERD).     . OXYGEN Inhale 2 L into the lungs continuous.    . potassium chloride SA (K-DUR,KLOR-CON) 20 MEQ tablet Take 2 tablets (40 mEq total) by mouth 2 (two) times daily. 360 tablet 3  . rivaroxaban (XARELTO) 20 MG TABS tablet Take 1 tablet (20 mg total) by mouth daily with supper. 90 tablet 3  . sertraline (ZOLOFT) 100 MG tablet Take 100 mg by mouth at bedtime.     . simvastatin (ZOCOR) 40 MG tablet Take 1 tablet (40 mg  total) by mouth daily at 6 PM. Please call and schedule a one year follow up appointment (Patient taking differently: Take 40 mg by mouth at bedtime. ) 90 tablet 0  . spironolactone (ALDACTONE) 25 MG tablet Take 1 tablet (25 mg total) by mouth daily. 30 tablet 6  . torsemide (DEMADEX) 20 MG tablet Take 60mg  in the morning and 40mg  in the evening (Patient taking differently: Take 40-60 mg by mouth See admin instructions. Take 60mg  in the morning and 40mg  in the evening) 450 tablet 3  . alendronate (FOSAMAX) 70 MG tablet Take 70 mg by mouth once a week. Reported on 02/03/2016  0   No current facility-administered medications on file prior to visit.      Review of Systems Constitutional:   No  weight loss, night sweats,  Fevers, chills, + fatigue, or  lassitude.  HEENT:   No headaches,  Difficulty swallowing,  Tooth/dental problems, or  Sore throat,                No sneezing, itching, ear ache, nasal congestion, post nasal drip,   CV:  No chest pain,  Orthopnea, PND, , anasarca, dizziness, palpitations, syncope.   GI  No heartburn, indigestion, abdominal pain, nausea, vomiting, diarrhea, change in bowel habits, loss of appetite, bloody stools.   Resp:    No chest wall deformity  Skin: no rash or lesions.  GU: no dysuria, change in color of urine, no urgency or frequency.  No flank pain, no hematuria   MS:  No joint pain or swelling.  No decreased range of motion.  No back pain.  Psych:  No change in mood or affect. No depression or anxiety.  No memory loss.         Objective:   Physical Exam Vitals:   03/10/16 1650  BP: (!) 142/70  Pulse: 68  Temp: 98.4 F (36.9 C)  TempSrc: Oral  SpO2: 94%  Weight: 182 lb (82.6 kg)  Height: 5\' 5"  (1.651 m)  Body mass index is 30.29 kg/m.   GEN: A/Ox3; pleasant , NAD, elderly on O2 in wc    HEENT:  [place]/AT,  EACs-clear, TMs-wnl, NOSE-clear, THROAT-clear, no lesions, no postnasal drip or exudate noted. Class 2 MP airway   NECK:  Supple  w/ fair ROM; no JVD; normal carotid impulses w/o bruits; no thyromegaly or nodules palpated; no lymphadenopathy.    RESP  Decreased BS in bases , . no accessory muscle  use, no dullness to percussion  CARD:  RRR, no m/r/g  , tr peripheral edema, pulses intact, no cyanosis or clubbing.  GI:   Soft & nt; nml bowel sounds; no organomegaly or masses detected.   Musco: Warm bil, no deformities or joint swelling noted.   Neuro: alert, no focal deficits noted.    Skin: Warm, no lesions or rashes  Alpha Chouinard NP-C  [facility] 03/10/2016        Assessment & Plan:

## 2016-03-10 NOTE — Assessment & Plan Note (Signed)
Severe COPD  Trial of ANORO   Plan  Patient Instructions  Trial of ANORO 1 puff daily  Continue on oxygen at 2l/m .  Follow up with .,Dr. Maple Hudson  In 6 weeks  and As needed   Please contact office for sooner follow up if symptoms do not improve or worsen or seek emergency care

## 2016-03-10 NOTE — Assessment & Plan Note (Signed)
Continue on O2 and diuretics.

## 2016-03-10 NOTE — Patient Instructions (Addendum)
Trial of ANORO 1 puff daily  Continue on oxygen at 2l/m .  Follow up with .,Dr. Maple Hudson  In 6 weeks  and As needed   Please contact office for sooner follow up if symptoms do not improve or worsen or seek emergency care

## 2016-03-11 ENCOUNTER — Telehealth: Payer: Self-pay | Admitting: Internal Medicine

## 2016-03-11 NOTE — Telephone Encounter (Signed)
Called and spoke with pt and she stated that her insurance company told her that the new inhaler would cost her $500.  She stated that she knew that was not correct.  She called the pharmacy and they stated that this would only be $45.  She stated that she did get this rx and it has helped her already.  She can tell a difference from this morning. She wanted to thank everyone for all of their help and pt stated that she will call back if anything further is needed.

## 2016-04-15 ENCOUNTER — Other Ambulatory Visit: Payer: Self-pay

## 2016-04-15 ENCOUNTER — Other Ambulatory Visit: Payer: Self-pay | Admitting: Cardiology

## 2016-04-15 NOTE — Telephone Encounter (Signed)
I was not sure if okay to refill under Dr Mayford Knife or if this needed to be routed to the CHF clinic. Patient is overdue for an appointment with Dr Mayford Knife, but has been going to the CHF clinic. Also she cancelled her last office visit with Dr Mayford Knife due to she had just saw the NP at CHF. Please advise. Thanks, MI

## 2016-04-18 ENCOUNTER — Other Ambulatory Visit (HOSPITAL_COMMUNITY): Payer: Self-pay | Admitting: Cardiology

## 2016-04-18 ENCOUNTER — Ambulatory Visit (INDEPENDENT_AMBULATORY_CARE_PROVIDER_SITE_OTHER): Payer: Medicare Other | Admitting: Internal Medicine

## 2016-04-18 ENCOUNTER — Encounter: Payer: Self-pay | Admitting: Internal Medicine

## 2016-04-18 VITALS — BP 128/80 | HR 97 | Ht 65.0 in | Wt 181.6 lb

## 2016-04-18 DIAGNOSIS — J9611 Chronic respiratory failure with hypoxia: Secondary | ICD-10-CM

## 2016-04-18 DIAGNOSIS — Z23 Encounter for immunization: Secondary | ICD-10-CM

## 2016-04-18 DIAGNOSIS — I272 Other secondary pulmonary hypertension: Secondary | ICD-10-CM | POA: Diagnosis not present

## 2016-04-18 DIAGNOSIS — J9621 Acute and chronic respiratory failure with hypoxia: Secondary | ICD-10-CM

## 2016-04-18 DIAGNOSIS — I482 Chronic atrial fibrillation, unspecified: Secondary | ICD-10-CM

## 2016-04-18 DIAGNOSIS — J9622 Acute and chronic respiratory failure with hypercapnia: Secondary | ICD-10-CM

## 2016-04-18 MED ORDER — METOPROLOL TARTRATE 50 MG PO TABS: 50.0000 mg | ORAL_TABLET | Freq: Two times a day (BID) | ORAL | 3 refills | Status: DC

## 2016-04-18 NOTE — Progress Notes (Signed)
Patient ID: [name], female    DOB: 19-Mar-1941, 75 y.o.   MRN: [id number]   F former smoker followed for COPD, sleep apnea, complicated by pulmonary hypertension,, hypoxic respiratory failure.   10/15/2015-75 year old female former smoker followed for COPD, OSA/failed CPAP, complicated by pulmonary hypertension, CHF, CAD/history [place] O2 2L [facility] 3/11-/13 for hypokalemia complicating her cardiac disease FOLLOWS FOR: pt states breathing has worsened. increased SOB, occ dry cough, nasal drainage. denies wheezing or chest pain/tightness . Sat today 96% on arrival. Weight up 8 pounds since 3/11 Recent hospitalization for CHF then back for weakness/hypokalemia. Has stayed weak. She says chest feels "fine" and denies wheeze or cough. Stable on oxygen 2 L. Home health nurse visits twice weekly. CXR 09/24/2015 IMPRESSION: Cardiomegaly with mild interstitial pulmonary edema and trace bilateral pleural effusions. Electronically Signed  By: Harmon PierJeffrey Hu M.D.  On: 09/24/2015 17:34  04/18/2016-75 year old female former smoker followed for COPD, PLMS, complicated bipolar hypertension, CHF, CAD/MI/ AFib O2 2 L/[facility] LOV 03/10/2016-NP-  Trial of Anoro for COPD Sleep study on 02/22/16 showed no OSA with AHI 0. Severe PLM  FOLLOWS FOR: Pt states she is feeling about the same since seeing TP 03-10-16; continues to have SOB and fatigue. Also, was not able to use Anoro-itchy. Pt would like flu shot today. Says Anoro made her scalp itch. It did not help her breathing. If she tries any activity without her oxygen she gets substernal pressure pain, quickly relieved by resuming oxygen. NPSG was negative for OSA. 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 She places a bar of soap at the foot of her bed to treat this problem and thinks it helps her.  Review of Systems- see HPI Constitutional:   + weight loss, night sweats, no-Fevers, chills,No- fatigue,  lassitude. HEENT:   + headaches,  No-Difficulty swallowing,  Tooth/dental problems, Sore throat,                No-sneezing, itching, ear ache, + nasal congestion, CV:  No chest pain, orthopnea, PND, +swelling in lower extremities, no-anasarca, dizziness, palpitations GI  No heartburn, indigestion, abdominal pain, nausea, vomiting, ,  Resp:   No excess mucus, no productive cough,  + non-productive cough,  No coughing up of blood.    No change in color of mucus.  No wheezing.   Skin: no rash or lesions. GU: . MS:  No joint pain or swelling.   Psych:  No change in mood or affect. No depression +anxiety.  No memory loss.  Objective:   Physical Exam  General- +Alert, Oriented, Affect-appropriate, Distress- none acute        + On O2 2L  Skin- rash-none, lesions- none, excoriation- none Lymphadenopathy- none Head- atraumatic            Eyes- Gross vision intact, PERRLA, conjunctivae clear secretions            Ears- Hearing, canals normal            Nose- Clear, No-septal dev, mucus, polyps, erosion, perforation             Throat- Mallampati III , mucosa clear/ dry , drainage- none, tonsils- atrophic.                        Large tongue. Neck- flexible , trachea midline, no stridor , thyroid nl, carotid no bruit Chest - symmetrical excursion , unlabored  Heart/CV- + IRR , no murmur heard today , no gallop  , no rub, nl s1 s2                           - JVD- none , edema-none, stasis changes- none, varices- none.           Lung-  Clear to P&A, wheeze- none , dullness-none, rub- none           Chest wall-  Abd-        + lumbar scoliosis/ juts right hip Br/ Gen/ Rectal- Not done, not indicated Extrem- cyanosis- none, clubbing, none, atrophy- none, strength- nl, + Rolling walker Neuro- grossly intact to observation  Assessment & Plan:

## 2016-04-18 NOTE — Patient Instructions (Signed)
Ok to continue O2 2L/ [facility]  Flu vax-  Hi  Please call as needed

## 2016-04-20 DIAGNOSIS — J9611 Chronic respiratory failure with hypoxia: Secondary | ICD-10-CM | POA: Insufficient documentation

## 2016-04-20 NOTE — Assessment & Plan Note (Signed)
Continues oxygen. 

## 2016-04-20 NOTE — Assessment & Plan Note (Signed)
Exam consistent with atrial fibrillation with well-controlled ventricular response rate, managed by cardiology

## 2016-04-20 NOTE — Assessment & Plan Note (Signed)
She remains dependent on oxygen and gets anginal chest pain if she exerts without it but is otherwise stable

## 2016-04-28 ENCOUNTER — Other Ambulatory Visit (HOSPITAL_COMMUNITY): Payer: Self-pay | Admitting: Student

## 2016-05-05 ENCOUNTER — Encounter (HOSPITAL_COMMUNITY): Payer: Self-pay

## 2016-05-05 ENCOUNTER — Ambulatory Visit (HOSPITAL_COMMUNITY)
Admission: RE | Admit: 2016-05-05 | Discharge: 2016-05-05 | Disposition: A | Discharge status: Home / Self Care | Payer: Medicare Other | Source: Ambulatory Visit | Attending: Cardiology | Admitting: Cardiology

## 2016-05-05 VITALS — BP 118/85 | HR 91 | Ht 65.0 in | Wt 180.0 lb

## 2016-05-05 DIAGNOSIS — I2510 Atherosclerotic heart disease of native coronary artery without angina pectoris: Secondary | ICD-10-CM | POA: Insufficient documentation

## 2016-05-05 DIAGNOSIS — Z9981 Dependence on supplemental oxygen: Secondary | ICD-10-CM | POA: Diagnosis not present

## 2016-05-05 DIAGNOSIS — I480 Paroxysmal atrial fibrillation: Secondary | ICD-10-CM | POA: Diagnosis not present

## 2016-05-05 DIAGNOSIS — I050 Rheumatic mitral stenosis: Secondary | ICD-10-CM | POA: Diagnosis not present

## 2016-05-05 DIAGNOSIS — Z7901 Long term (current) use of anticoagulants: Secondary | ICD-10-CM | POA: Insufficient documentation

## 2016-05-05 DIAGNOSIS — I10 Essential (primary) hypertension: Secondary | ICD-10-CM

## 2016-05-05 DIAGNOSIS — I482 Chronic atrial fibrillation, unspecified: Secondary | ICD-10-CM

## 2016-05-05 DIAGNOSIS — I272 Pulmonary hypertension, unspecified: Secondary | ICD-10-CM

## 2016-05-05 DIAGNOSIS — Z951 Presence of aortocoronary bypass graft: Secondary | ICD-10-CM | POA: Insufficient documentation

## 2016-05-05 DIAGNOSIS — K219 Gastro-esophageal reflux disease without esophagitis: Secondary | ICD-10-CM | POA: Diagnosis not present

## 2016-05-05 DIAGNOSIS — R2241 Localized swelling, mass and lump, right lower limb: Secondary | ICD-10-CM | POA: Insufficient documentation

## 2016-05-05 DIAGNOSIS — Z8711 Personal history of peptic ulcer disease: Secondary | ICD-10-CM | POA: Diagnosis not present

## 2016-05-05 DIAGNOSIS — I5032 Chronic diastolic (congestive) heart failure: Secondary | ICD-10-CM | POA: Diagnosis not present

## 2016-05-05 DIAGNOSIS — M1990 Unspecified osteoarthritis, unspecified site: Secondary | ICD-10-CM | POA: Insufficient documentation

## 2016-05-05 DIAGNOSIS — Z825 Family history of asthma and other chronic lower respiratory diseases: Secondary | ICD-10-CM | POA: Diagnosis not present

## 2016-05-05 DIAGNOSIS — E785 Hyperlipidemia, unspecified: Secondary | ICD-10-CM | POA: Diagnosis not present

## 2016-05-05 DIAGNOSIS — Z79899 Other long term (current) drug therapy: Secondary | ICD-10-CM | POA: Insufficient documentation

## 2016-05-05 DIAGNOSIS — G4733 Obstructive sleep apnea (adult) (pediatric): Secondary | ICD-10-CM | POA: Insufficient documentation

## 2016-05-05 DIAGNOSIS — Z87891 Personal history of nicotine dependence: Secondary | ICD-10-CM | POA: Insufficient documentation

## 2016-05-05 DIAGNOSIS — J449 Chronic obstructive pulmonary disease, unspecified: Secondary | ICD-10-CM | POA: Insufficient documentation

## 2016-05-05 DIAGNOSIS — I2721 Secondary pulmonary arterial hypertension: Secondary | ICD-10-CM | POA: Diagnosis not present

## 2016-05-05 DIAGNOSIS — I110 Hypertensive heart disease with heart failure: Secondary | ICD-10-CM | POA: Diagnosis not present

## 2016-05-05 LAB — LIPID PANEL
CHOLESTEROL: 168 mg/dL (ref 0–200)
HDL: 71 mg/dL (ref >40)
LDL Cholesterol: 78 mg/dL (ref 0–99)
Total CHOL/HDL Ratio: 2.4 RATIO
Triglycerides: 93 mg/dL (ref <150)
VLDL: 19 mg/dL (ref 0–40)

## 2016-05-05 LAB — BASIC METABOLIC PANEL
Anion gap: 11 (ref 5–15)
BUN: 20 mg/dL (ref 6–20)
CALCIUM: 9.7 mg/dL (ref 8.9–10.3)
CHLORIDE: 91 mmol/L — AB (ref 101–111)
CO2: 35 mmol/L — ABNORMAL HIGH (ref 22–32)
CREATININE: 1.17 mg/dL — AB (ref 0.44–1.00)
GFR, EST AFRICAN AMERICAN: 51 mL/min — AB (ref >60)
GFR, EST NON AFRICAN AMERICAN: 44 mL/min — AB (ref >60)
Glucose, Bld: 119 mg/dL — ABNORMAL HIGH (ref 65–99)
Potassium: 3.6 mmol/L (ref 3.5–5.1)
SODIUM: 137 mmol/L (ref 135–145)

## 2016-05-05 LAB — BRAIN NATRIURETIC PEPTIDE: B NATRIURETIC PEPTIDE 5: 319.4 pg/mL — AB (ref 0.0–100.0)

## 2016-05-05 MED ORDER — ISOSORBIDE MONONITRATE ER 60 MG PO TB24: 60.0000 mg | ORAL_TABLET | Freq: Every day | ORAL | 3 refills | Status: DC

## 2016-05-05 MED ORDER — DOXYCYCLINE HYCLATE 100 MG PO TABS: 100.0000 mg | ORAL_TABLET | Freq: Two times a day (BID) | ORAL | 0 refills | Status: AC

## 2016-05-05 MED ORDER — TORSEMIDE 20 MG PO TABS: 60.0000 mg | ORAL_TABLET | Freq: Two times a day (BID) | ORAL | 3 refills | Status: DC

## 2016-05-05 NOTE — Progress Notes (Signed)
ReDS vest: 37

## 2016-05-05 NOTE — Progress Notes (Signed)
Patient ID: [name], female   DOB: June 10, 1941, 75 y.o.   MRN: [id number]    Advanced Heart Failure Clinic Note   PCP: Dr Valentina LucksGriffin  Pulmonary: Dr Maple HudsonYoung HF Cardiology: Dr. Shirlee LatchMcLean  HPI: [name] is a 75 y.o. female with a history of chronic diastolic CHF, moderate pulmonary HTN, HTN, CAD s/p CABG, COPD on home oxygen, and dyslipidemia referred to the HF clinic by Dr Mayford Knifeurner.    Admitted 3/2 - 09/30/15 with A/C CHF. TEE was done to assess mitral stenosis, appeared mild to moderate, rheumatic valve.  TEE also showed aortic valve vegetation and concern arose for endocarditis with recent "tooth infection" per pt.  ID saw. 1 of 2 admission cultures with Gram + cocci in clusters thought to be contaminant.  2 further sets of BCx negative. RHC showed elevated right and left heart filling pressures.  Aortic valve was not crossed for full mitral stenosis assessment due to aortic valve vegetation. Diuresed 8 lbs. Discharge weight 175 lbs.   Due to increased dyspnea and exertional chest pain, she had RHC/LHC in 7/17.  Bypass grafts were patent but there was 90% stenosis in the distal LCx that could account for angina.  It was a small caliber vessel and not amenable to PCI. Filling pressures were near-normal on RHC.   She returns today for followup.  She is wearing oxygen at all times.  She is now on Imdur and reports significantly decreased chest pain. She still has some chest tightness, especially when she gets short of breath.  She is short of breath after walking 50 feet.  She uses 2 pillows at night long-term.  No palpitations.  She is in NSR today. Weight is down 3 lbs.  ReDs vest measurement was 37.    She also notes a right inner thigh infection (erythematous nodule) near vein harvest site.  She has had this on and off, antibiotics tend to make it better.    - ECHO 12/2014 EF 55-60%, peak PA pressure 68 mmHg, mild to moderate MS. - ECHO 2/17 EF 60-65%, mild LVH, mildly dilated RV with mildly  decreased systolic function, PASP 29 mmHg (not sure we got a full envelope), moderate mitral stenosis with mean gradient 6 mmHg and MVA 1.37 cm^2, mild MR, mild AI, aortic sclerosis without significant stenosis.   - TEE 09/24/15 EF 60%, small 0.8 cm mobile mass on the LV side of the aortic valve. Mild AI and AS. 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 A small PFO with a weakly positive bubble study, Moderate TR  PFTs (2/17): moderate-severe obstructive airways disease.   Sleep study 2017 was negative, no significant OSA.   RHC 09/24/15 (did not do LHC and cross valve to assess mitral stenosis due to aortic valve vegetation): Hemodynamics (mmHg) RA mean 16 RV 65/14 PA 71/28, mean 45 PCWP mean 32 Cardiac Output (Fick) 4.85  Cardiac Index (Fick) 2.51 PVR 2.68 WU  LHC/RHC (7/17): 50% pLAD, patent LIMA-LAD, totally occluded PLOM, patent SVG-PLOM, AV LCx distal to PLOM with 90% stenosis (small caliber), totally occluded PLV, patent SVG-PLV.  RA mean 5 PA 43/13 PCWP mean 17 CI 2.7 PVR 1.9 WU Transient CHB when LV was entered, so unable to measure gradient across MV.   ECG: NSR with PACs, LAFB, RBBB  Labs 01/09/2015 K 4.3 Creatinine 0.91 BNP  Labs 02/19/2015 K 4.1 Creatinine 1.03 Labs 2/17 LDL 82 Labs 10/05/15 K 3.8, Creatinine 0.89  Labs 10/08/15 K 4.6, Creatinine 0.7 Labs 6/17 K 4.3, creatinine 0.95, HCT 32.7, BNP 221 Labs 7/17 BNP 286, K 4.2, creatinine 1.0, HCT 31.2  Past Medical History:  Diagnosis Date  . Anxiety   . Bursitis, shoulder    bilateral  . CHF (congestive heart failure) (HCC)    chronic diasotlic CHF  . Complication of anesthesia    "have trouble getting me awake sometimes; been ok latelhy" (09/24/2015)  . COPD (chronic obstructive pulmonary disease) (HCC)   . Coronary heart disease 2001   s/p CABG  . Depression   . DJD (degenerative joint disease)   . GERD (gastroesophageal reflux disease)   . H/O hiatal hernia   .  History of rheumatic heart disease    X 3-LAST TIME WHEN PATIENT WAS 75 YRS OLD  . Hyperlipidemia   . Hypertension   . OA (osteoarthritis)   . On home oxygen therapy    "2L; 24/7" (09/24/2015)  . PUD (peptic ulcer disease)   . Pulmonary HTN    PASP 50-53mmHg by [id number]/1914  . Pulmonary HTN   . Sleep apnea    intolerant to CPAP  . Thyroid nodule   . Urinary incontinence   . Varicose veins     Social History: The patient  reports that she quit smoking about 7 years ago. Her smoking use included Cigarettes. She has a 47 pack-year smoking history. She has never used smokeless tobacco. She reports that she does not drink alcohol or use illicit drugs.   Family History: The patient's family history includes Anemia in her mother; Emphysema in her father; Esophageal cancer in her daughter.   ROS: All systems negative except as listed in HPI, PMH and Problem List.  Current Outpatient Prescriptions  Medication Sig Dispense Refill  . acetaminophen (TYLENOL) 500 MG tablet Take 1,000 mg by mouth 2 (two) times daily as needed for headache.     . calcium-vitamin D (OSCAL WITH D) 500-200 MG-UNIT per tablet Take 2 tablets by mouth daily with breakfast.    . isosorbide mononitrate (IMDUR) 60 MG 24 hr tablet Take 1 tablet (60 mg total) by mouth daily. 90 tablet 3  . loratadine (CLARITIN) 10 MG tablet Take 10 mg by mouth at bedtime as needed for allergies.     . metoprolol (LOPRESSOR) 50 MG tablet Take 1 tablet (50 mg total) by mouth 2 (two) times daily. 180 tablet 3  . NITROSTAT 0.4 MG SL tablet place 1 tablet under the tongue if needed every 5 minutes for chest pain for 3 doses IF NO RELIEF AFTER 3RD DOSE CALL PRESCRIBER OR 911. 25 tablet 0  . omeprazole (PRILOSEC OTC) 20 MG tablet Take 20 mg by mouth at bedtime as needed (GERD).     . OXYGEN Inhale 2 L into the lungs continuous.    . potassium chloride SA (K-DUR,KLOR-CON) 20 MEQ tablet Take 2 tablets (40 mEq total) by mouth 2 (two) times daily. 360  tablet 3  . rivaroxaban (XARELTO) 20 MG TABS tablet Take 1 tablet (20 mg total) by mouth daily with supper. 90 tablet 3  . sertraline (ZOLOFT) 100 MG tablet Take 100 mg by mouth at bedtime.     . simvastatin (ZOCOR) 40 MG tablet Take 1 tablet (40 mg total) by mouth daily at 6 PM. Please call and schedule a one year follow up appointment (Patient taking differently: Take 40 mg by mouth at bedtime. ) 90 tablet 0  . spironolactone (ALDACTONE) 25 MG tablet take 1  tablet by mouth once daily 30 tablet 6  . torsemide (DEMADEX) 20 MG tablet Take 3 tablets (60 mg total) by mouth 2 (two) times daily. 450 tablet 3  . doxycycline (VIBRA-TABS) 100 MG tablet Take 1 tablet (100 mg total) by mouth 2 (two) times daily. 20 tablet 0   No current facility-administered medications for this encounter.     Vitals:   05/05/16 0907  BP: 118/85  Pulse: 91  SpO2: 96%  Weight: 180 lb (81.6 kg)  Height: 5\' 5"  (1.651 m)   Wt Readings from Last 3 Encounters:  05/05/16 180 lb (81.6 kg)  04/18/16 181 lb 9.6 oz (82.4 kg)  03/10/16 182 lb (82.6 kg)     PHYSICAL EXAM:  General:  NAD, wearing oxygen. HEENT: normal Neck: supple. JVP 8 with HJR. Carotids 2+ bilaterally; no bruits. No thyromegaly or nodule noted.  Cor: PMI normal. RRR. No rubs, gallops.  2/6 SEM RUSB.  Lungs: Distant breath sounds bilaterally. On 2 liters [place] Abdomen: soft, NT, ND, no HSM. No bruits or masses. +BS  Extremities: no cyanosis, clubbing.  No edema.  There is an erythematous nodule right inner thigh.  Neuro: alert & orientedx3, cranial nerves grossly intact. Moves all 4 extremities w/o difficulty. Affect pleasant.  ASSESSMENT & PLAN: 1. Chronic diastolic CHF/valvular heart disease: TEE in 3/17 showed normal EF, mild to moderate mitral stenosis.  Most recent RHC in 7/17 showed near-normal filling pressures.  Today, NYHA IIIb symptoms.  This is likely a combination of CHF and COPD.  She appears probably mildly volume overloaded on exam, vest  reading of 37 supports mild volume overload.  I suspect that a lot of her dyspnea is from COPD.   - I will increase torsemide incrementally to 60 mg bid.     - Continue 25 mg spironolactone daily.  - BMET/BNP today and repeat in 10 days.   2. CAD S/P CABG:  Exertional chest pain still present but improved on Imdur.  She has potential source of angina from 90% stenosis in distal LCx.  The vessel is small at this point and not amenable to PCI.  - Continue statin and metoprolol.  - On Xarelto so no aspirin.  - Increase Imdur to 60 mg daily.  3. Atrial fibrillation: Paroxysmal. CHADSVASC score is 4 (age > 69, female, HTN, CHF).  She remains in NSR.  - Continue Xarelto/metoprolol.  4. Pulmonary HTN: This is primarily pulmonary venous hypertension based on most recent RHC. 5. COPD: Followed by Dr Maple Hudson. PFTs in 2/17 with moderate to severe obstruction.  On 2 liters [place].  6.  OSA: intolerant CPAP 7.  Mitral stenosis: Rheumatic mitral stenosis.  Mild to moderate mitral stenosis by TEE in 3/17.  Unable to assess by cath in 3/17 because she had aortic valve vegetation and the valve was not crossed. Unable to assess by cath in 7/17 because she had transient complete heart block when the LV was entered. Treating medically for now.  Would be high risk for surgery with severe COPD.  However, possibly could be valvuloplasty candidate.    8. Aortic valve vegetation: Noted on TEE in 3/17 done to assess mitral stenosis.  No fever, blood cultures negative.  May have come from prior dental infection.  ID did not recommend antibiotics.  9. Leg nodule: Concern for infection.  I will start doxycycline 100 mg bid x 10 days, he will need to see his PCP soon.  Marca Ancona  05/05/2016

## 2016-05-05 NOTE — Patient Instructions (Signed)
Start Doxycycline 100 mg Twice daily FOR 10 DAYS  Increase Torsemide to 60 mg (3 tabs) Twice daily   Increase Isosorbide to 60 mg daily  Labs today  Labs in 10 days  Your physician recommends that you schedule a follow-up appointment in: 1 month

## 2016-05-12 ENCOUNTER — Ambulatory Visit: Payer: Medicare Other | Admitting: Internal Medicine

## 2016-05-16 ENCOUNTER — Ambulatory Visit (HOSPITAL_COMMUNITY)
Admission: RE | Admit: 2016-05-16 | Discharge: 2016-05-16 | Disposition: A | Discharge status: Home / Self Care | Payer: Medicare Other | Source: Ambulatory Visit | Attending: Cardiology | Admitting: Cardiology

## 2016-05-16 ENCOUNTER — Other Ambulatory Visit (HOSPITAL_COMMUNITY): Payer: Self-pay | Admitting: *Deleted

## 2016-05-16 DIAGNOSIS — I5032 Chronic diastolic (congestive) heart failure: Secondary | ICD-10-CM

## 2016-05-16 DIAGNOSIS — I5023 Acute on chronic systolic (congestive) heart failure: Secondary | ICD-10-CM

## 2016-05-16 LAB — BASIC METABOLIC PANEL
ANION GAP: 12 (ref 5–15)
BUN: 23 mg/dL — ABNORMAL HIGH (ref 6–20)
CALCIUM: 9.6 mg/dL (ref 8.9–10.3)
CO2: 35 mmol/L — ABNORMAL HIGH (ref 22–32)
Chloride: 91 mmol/L — ABNORMAL LOW (ref 101–111)
Creatinine, Ser: 1.18 mg/dL — ABNORMAL HIGH (ref 0.44–1.00)
GFR, EST AFRICAN AMERICAN: 51 mL/min — AB (ref >60)
GFR, EST NON AFRICAN AMERICAN: 44 mL/min — AB (ref >60)
Glucose, Bld: 104 mg/dL — ABNORMAL HIGH (ref 65–99)
POTASSIUM: 3.5 mmol/L (ref 3.5–5.1)
SODIUM: 138 mmol/L (ref 135–145)

## 2016-05-24 ENCOUNTER — Ambulatory Visit: Payer: Medicare Other | Admitting: Internal Medicine

## 2016-06-03 ENCOUNTER — Other Ambulatory Visit (HOSPITAL_COMMUNITY): Payer: Self-pay | Admitting: Cardiology

## 2016-06-06 ENCOUNTER — Ambulatory Visit (HOSPITAL_COMMUNITY)
Admission: RE | Admit: 2016-06-06 | Discharge: 2016-06-06 | Disposition: A | Discharge status: Home / Self Care | Payer: Medicare Other | Source: Ambulatory Visit | Attending: Cardiology | Admitting: Cardiology

## 2016-06-06 ENCOUNTER — Encounter (HOSPITAL_COMMUNITY): Payer: Self-pay

## 2016-06-06 VITALS — BP 148/60 | HR 69 | Wt 180.5 lb

## 2016-06-06 DIAGNOSIS — G4733 Obstructive sleep apnea (adult) (pediatric): Secondary | ICD-10-CM | POA: Diagnosis not present

## 2016-06-06 DIAGNOSIS — Z8711 Personal history of peptic ulcer disease: Secondary | ICD-10-CM | POA: Diagnosis not present

## 2016-06-06 DIAGNOSIS — Z7901 Long term (current) use of anticoagulants: Secondary | ICD-10-CM | POA: Diagnosis not present

## 2016-06-06 DIAGNOSIS — I480 Paroxysmal atrial fibrillation: Secondary | ICD-10-CM | POA: Insufficient documentation

## 2016-06-06 DIAGNOSIS — I050 Rheumatic mitral stenosis: Secondary | ICD-10-CM

## 2016-06-06 DIAGNOSIS — Z9889 Other specified postprocedural states: Secondary | ICD-10-CM | POA: Insufficient documentation

## 2016-06-06 DIAGNOSIS — K219 Gastro-esophageal reflux disease without esophagitis: Secondary | ICD-10-CM | POA: Insufficient documentation

## 2016-06-06 DIAGNOSIS — E785 Hyperlipidemia, unspecified: Secondary | ICD-10-CM | POA: Diagnosis not present

## 2016-06-06 DIAGNOSIS — I272 Pulmonary hypertension, unspecified: Secondary | ICD-10-CM | POA: Insufficient documentation

## 2016-06-06 DIAGNOSIS — I482 Chronic atrial fibrillation, unspecified: Secondary | ICD-10-CM

## 2016-06-06 DIAGNOSIS — Z87891 Personal history of nicotine dependence: Secondary | ICD-10-CM | POA: Diagnosis not present

## 2016-06-06 DIAGNOSIS — F329 Major depressive disorder, single episode, unspecified: Secondary | ICD-10-CM | POA: Insufficient documentation

## 2016-06-06 DIAGNOSIS — Z79899 Other long term (current) drug therapy: Secondary | ICD-10-CM | POA: Diagnosis not present

## 2016-06-06 DIAGNOSIS — I25119 Atherosclerotic heart disease of native coronary artery with unspecified angina pectoris: Secondary | ICD-10-CM | POA: Diagnosis not present

## 2016-06-06 DIAGNOSIS — I5032 Chronic diastolic (congestive) heart failure: Secondary | ICD-10-CM | POA: Diagnosis not present

## 2016-06-06 DIAGNOSIS — I110 Hypertensive heart disease with heart failure: Secondary | ICD-10-CM | POA: Diagnosis present

## 2016-06-06 DIAGNOSIS — J449 Chronic obstructive pulmonary disease, unspecified: Secondary | ICD-10-CM | POA: Insufficient documentation

## 2016-06-06 DIAGNOSIS — Z951 Presence of aortocoronary bypass graft: Secondary | ICD-10-CM | POA: Diagnosis not present

## 2016-06-06 DIAGNOSIS — Z9981 Dependence on supplemental oxygen: Secondary | ICD-10-CM | POA: Insufficient documentation

## 2016-06-06 DIAGNOSIS — F419 Anxiety disorder, unspecified: Secondary | ICD-10-CM | POA: Diagnosis not present

## 2016-06-06 DIAGNOSIS — I330 Acute and subacute infective endocarditis: Secondary | ICD-10-CM | POA: Diagnosis not present

## 2016-06-06 LAB — BASIC METABOLIC PANEL
Anion gap: 10 (ref 5–15)
BUN: 18 mg/dL (ref 6–20)
CALCIUM: 9.2 mg/dL (ref 8.9–10.3)
CO2: 33 mmol/L — ABNORMAL HIGH (ref 22–32)
CREATININE: 1.28 mg/dL — AB (ref 0.44–1.00)
Chloride: 95 mmol/L — ABNORMAL LOW (ref 101–111)
GFR calc Af Amer: 46 mL/min — ABNORMAL LOW (ref >60)
GFR, EST NON AFRICAN AMERICAN: 40 mL/min — AB (ref >60)
GLUCOSE: 105 mg/dL — AB (ref 65–99)
Potassium: 4.2 mmol/L (ref 3.5–5.1)
Sodium: 138 mmol/L (ref 135–145)

## 2016-06-06 LAB — CBC
HCT: 27.5 % — ABNORMAL LOW (ref 36.0–46.0)
Hemoglobin: 8 g/dL — ABNORMAL LOW (ref 12.0–15.0)
MCH: 23.4 pg — AB (ref 26.0–34.0)
MCHC: 29.1 g/dL — AB (ref 30.0–36.0)
MCV: 80.4 fL (ref 78.0–100.0)
PLATELETS: 270 10*3/uL (ref 150–400)
RBC: 3.42 MIL/uL — ABNORMAL LOW (ref 3.87–5.11)
RDW: 16.8 % — ABNORMAL HIGH (ref 11.5–15.5)
WBC: 8.5 10*3/uL (ref 4.0–10.5)

## 2016-06-06 LAB — BRAIN NATRIURETIC PEPTIDE: B Natriuretic Peptide: 443.7 pg/mL — ABNORMAL HIGH (ref 0.0–100.0)

## 2016-06-06 MED ORDER — ISOSORBIDE MONONITRATE ER 60 MG PO TB24: 90.0000 mg | ORAL_TABLET | Freq: Every day | ORAL | 3 refills | Status: DC

## 2016-06-06 NOTE — Progress Notes (Signed)
Patient ID: [name], female   DOB: 03-17-1941, 75 y.o.   MRN: [id number]    Advanced Heart Failure Clinic Note   PCP: Dr Valentina Lucks  Pulmonary: Dr Maple Hudson HF Cardiology: Dr. Shirlee Latch  HPI: [name] is a 75 y.o. female with a history of chronic diastolic CHF, moderate pulmonary HTN, HTN, CAD s/p CABG, COPD on home oxygen, and dyslipidemia referred to the HF clinic by Dr Mayford Knife.    Admitted 3/2 - 09/30/15 with A/C CHF. TEE was done to assess mitral stenosis, appeared mild to moderate, rheumatic valve.  TEE also showed aortic valve vegetation and concern arose for endocarditis with recent "tooth infection" per pt.  ID saw. 1 of 2 admission cultures with Gram + cocci in clusters thought to be contaminant.  2 further sets of BCx negative. RHC showed elevated right and left heart filling pressures.  Aortic valve was not crossed for full mitral stenosis assessment due to aortic valve vegetation. Diuresed 8 lbs. Discharge weight 175 lbs.   Due to increased dyspnea and exertional chest pain, she had RHC/LHC in 7/17.  Bypass grafts were patent but there was 90% stenosis in the distal LCx that could account for angina.  It was a small caliber vessel and not amenable to PCI. Filling pressures were near-normal on RHC.   She returns today for followup.  She is wearing oxygen at all times.  Weight is stable.  She has had some increase in dyspnea, notes shortness of breath walking around the house. No edema.  She has chronic anginal chest pain => notes chest tightness with heavy exertion and when she gets short of breath.    ReDS vest reading 30  ECG: NSR, RBBB, LAFB  - ECHO 12/2014 EF 55-60%, peak PA pressure 68 mmHg, mild to moderate MS. - ECHO 2/17 EF 60-65%, mild LVH, mildly dilated RV with mildly decreased systolic function, PASP 29 mmHg (not sure we got a full envelope), moderate mitral stenosis with mean gradient 6 mmHg and MVA 1.37 cm^2, mild MR, mild AI, aortic sclerosis without significant stenosis.    - TEE 09/24/15 EF 60%, small 0.8 cm mobile mass on the LV side of the aortic valve. Mild AI and AS. 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 A small PFO with a weakly positive bubble study, Moderate TR  PFTs (2/17): moderate-severe obstructive airways disease.   Sleep study 2017 was negative, no significant OSA.   RHC 09/24/15 (did not do LHC and cross valve to assess mitral stenosis due to aortic valve vegetation): Hemodynamics (mmHg) RA mean 16 RV 65/14 PA 71/28, mean 45 PCWP mean 32 Cardiac Output (Fick) 4.85  Cardiac Index (Fick) 2.51 PVR 2.68 WU  LHC/RHC (7/17): 50% pLAD, patent LIMA-LAD, totally occluded PLOM, patent SVG-PLOM, AV LCx distal to PLOM with 90% stenosis (small caliber), totally occluded PLV, patent SVG-PLV.  RA mean 5 PA 43/13 PCWP mean 17 CI 2.7 PVR 1.9 WU Transient CHB when LV was entered, so unable to measure gradient across MV.   Labs 01/09/2015 K 4.3 Creatinine 0.91 BNP  Labs 02/19/2015 K 4.1 Creatinine 1.03 Labs 2/17 LDL 82 Labs 10/05/15 K 3.8, Creatinine 0.89  Labs 10/08/15 K 4.6, Creatinine 0.7 Labs 6/17 K 4.3, creatinine 0.95, HCT 32.7, BNP 221 Labs 7/17 BNP 286, K 4.2, creatinine 1.0, HCT 31.2 Labs 10/17 K 3.5, creatinine 1.18, LDL 78  Past Medical History:  Diagnosis Date  . Anxiety   . Bursitis,  shoulder    bilateral  . CHF (congestive heart failure) (HCC)    chronic diasotlic CHF  . Complication of anesthesia    "have trouble getting me awake sometimes; been ok latelhy" (09/24/2015)  . COPD (chronic obstructive pulmonary disease) (HCC)   . Coronary heart disease 2001   s/p CABG  . Depression   . DJD (degenerative joint disease)   . GERD (gastroesophageal reflux disease)   . H/O hiatal hernia   . History of rheumatic heart disease    X 3-LAST TIME WHEN PATIENT WAS 75 YRS OLD  . Hyperlipidemia   . Hypertension   . OA (osteoarthritis)   . On home oxygen therapy    "2L; 24/7" (09/24/2015)  . PUD  (peptic ulcer disease)   . Pulmonary HTN    PASP 50-7055mmHg by [id number]/2014  . Pulmonary HTN   . Sleep apnea    intolerant to CPAP  . Thyroid nodule   . Urinary incontinence   . Varicose veins     Social History: The patient  reports that she quit smoking about 7 years ago. Her smoking use included Cigarettes. She has a 47 pack-year smoking history. She has never used smokeless tobacco. She reports that she does not drink alcohol or use illicit drugs.   Family History: The patient's family history includes Anemia in her mother; Emphysema in her father; Esophageal cancer in her daughter.   ROS: All systems negative except as listed in HPI, PMH and Problem List.  Current Outpatient Prescriptions  Medication Sig Dispense Refill  . acetaminophen (TYLENOL) 500 MG tablet Take 1,000 mg by mouth 2 (two) times daily as needed for headache.     . calcium-vitamin D (OSCAL WITH D) 500-200 MG-UNIT per tablet Take 2 tablets by mouth daily with breakfast.    . isosorbide mononitrate (IMDUR) 60 MG 24 hr tablet Take 1.5 tablets (90 mg total) by mouth daily. 135 tablet 3  . loratadine (CLARITIN) 10 MG tablet Take 10 mg by mouth at bedtime as needed for allergies.     . metoprolol (LOPRESSOR) 50 MG tablet Take 1 tablet (50 mg total) by mouth 2 (two) times daily. 180 tablet 3  . omeprazole (PRILOSEC OTC) 20 MG tablet Take 20 mg by mouth at bedtime as needed (GERD).     . OXYGEN Inhale 2 L into the lungs continuous.    . potassium chloride SA (K-DUR,KLOR-CON) 20 MEQ tablet Take 2 tablets (40 mEq total) by mouth 2 (two) times daily. 360 tablet 3  . rivaroxaban (XARELTO) 20 MG TABS tablet Take 1 tablet (20 mg total) by mouth daily with supper. 90 tablet 3  . sertraline (ZOLOFT) 100 MG tablet Take 100 mg by mouth at bedtime.     . simvastatin (ZOCOR) 40 MG tablet TAKE 1 TABLET BY MOUTH  DAILY AT 6 PM 90 tablet 3  . spironolactone (ALDACTONE) 25 MG tablet take 1 tablet by mouth once daily 30 tablet 6  .  torsemide (DEMADEX) 20 MG tablet Take 3 tablets (60 mg total) by mouth 2 (two) times daily. 450 tablet 3  . NITROSTAT 0.4 MG SL tablet place 1 tablet under the tongue if needed every 5 minutes for chest pain for 3 doses IF NO RELIEF AFTER 3RD DOSE CALL PRESCRIBER OR 911. (Patient not taking: Reported on 06/06/2016) 25 tablet 0   No current facility-administered medications for this encounter.     Vitals:   06/06/16 0904  BP: (!) 148/60  Pulse: 69  SpO2:  99%  Weight: 180 lb 8 oz (81.9 kg)   Wt Readings from Last 3 Encounters:  06/06/16 180 lb 8 oz (81.9 kg)  05/05/16 180 lb (81.6 kg)  04/18/16 181 lb 9.6 oz (82.4 kg)     PHYSICAL EXAM:  General:  NAD, wearing oxygen. HEENT: normal Neck: supple. JVP 7. Carotids 2+ bilaterally; no bruits. No thyromegaly or nodule noted.  Cor: PMI normal. RRR. No rubs, gallops. 1/6 SEM RUSB.   Lungs: Distant breath sounds bilaterally. On 2 liters [place] Abdomen: soft, NT, ND, no HSM. No bruits or masses. +BS  Extremities: no cyanosis, clubbing.  No edema.   Neuro: alert & orientedx3, cranial nerves grossly intact. Moves all 4 extremities w/o difficulty. Affect pleasant.  ASSESSMENT & PLAN: 1. Chronic diastolic CHF/valvular heart disease: TEE in 3/17 showed normal EF, mild to moderate mitral stenosis.  Most recent RHC in 7/17 showed near-normal filling pressures.  Today, NYHA IIIb symptoms.  She is not volume overloaded by exam or ReDS vest measurement.  I suspect that a lot of her dyspnea at this point is from COPD.   - Continue torsemide 60 mg bid.     - Continue 25 mg spironolactone daily.  - BMET/BNP today.   2. CAD S/P CABG:  She still has episodes of exertional dyspnea.  She has potential source of angina from 90% stenosis in distal LCx.  The vessel is small at this point and not amenable to PCI.  - Continue statin and metoprolol.  - On Xarelto so no aspirin.  - Increase Imdur to 90 mg daily.  3. Atrial fibrillation: Paroxysmal. CHADSVASC score is  4 (age > 84, female, HTN, CHF).  She remains in NSR.  - Continue Xarelto/metoprolol.  4. Pulmonary HTN: This is primarily pulmonary venous hypertension based on most recent RHC. 5. COPD: Followed by Dr Maple Hudson. PFTs in 2/17 with moderate to severe obstruction.  On 2 liters [place].  I think this is the major source of her current dyspnea at this time.  6.  OSA: intolerant CPAP 7.  Mitral stenosis: Rheumatic mitral stenosis.  Mild to moderate mitral stenosis by TEE in 3/17.  Unable to assess by cath in 3/17 because she had aortic valve vegetation and the valve was not crossed. Unable to assess by cath in 7/17 because she had transient complete heart block when the LV was entered. Treating medically for now.  Would be high risk for surgery with severe COPD.  However, possibly could be valvuloplasty candidate.    8. Aortic valve vegetation: Noted on TEE in 3/17 done to assess mitral stenosis.  No fever, blood cultures negative.  May have come from prior dental infection.  ID did not recommend antibiotics.   Marca Ancona  06/06/2016

## 2016-06-06 NOTE — Patient Instructions (Signed)
Labs today (will call for abnormal results, otherwise no news is good news)  EKG today  Vest Measurement  Increase Imdur to 90 mg (1.5 Tabs) once Daily  Follow up in 3 months

## 2016-06-06 NOTE — Progress Notes (Signed)
    ReDS Vest - 06/06/16 1000      ReDS Vest   MR  No   Estimated volume prior to reading Med   Fitting Posture Standing   Height Marker Tall   Ruler Value 13.5   Center Strip Aligned   ReDS Value 30

## 2016-06-11 ENCOUNTER — Inpatient Hospital Stay (HOSPITAL_COMMUNITY)
Admission: EM | Admit: 2016-06-11 | Discharge: 2016-06-18 | DRG: 308 | Disposition: A | Discharge status: Home / Self Care | Payer: Medicare Other | Attending: Internal Medicine | Admitting: Internal Medicine

## 2016-06-11 ENCOUNTER — Emergency Department (HOSPITAL_COMMUNITY): Payer: Medicare Other

## 2016-06-11 ENCOUNTER — Encounter (HOSPITAL_COMMUNITY): Payer: Self-pay | Admitting: Emergency Medicine

## 2016-06-11 DIAGNOSIS — T502X5A Adverse effect of carbonic-anhydrase inhibitors, benzothiadiazides and other diuretics, initial encounter: Secondary | ICD-10-CM | POA: Diagnosis not present

## 2016-06-11 DIAGNOSIS — Q211 Atrial septal defect: Secondary | ICD-10-CM | POA: Diagnosis not present

## 2016-06-11 DIAGNOSIS — K219 Gastro-esophageal reflux disease without esophagitis: Secondary | ICD-10-CM | POA: Diagnosis present

## 2016-06-11 DIAGNOSIS — J9621 Acute and chronic respiratory failure with hypoxia: Secondary | ICD-10-CM | POA: Diagnosis present

## 2016-06-11 DIAGNOSIS — Z9981 Dependence on supplemental oxygen: Secondary | ICD-10-CM | POA: Diagnosis not present

## 2016-06-11 DIAGNOSIS — Z7901 Long term (current) use of anticoagulants: Secondary | ICD-10-CM | POA: Diagnosis not present

## 2016-06-11 DIAGNOSIS — I25119 Atherosclerotic heart disease of native coronary artery with unspecified angina pectoris: Secondary | ICD-10-CM | POA: Diagnosis present

## 2016-06-11 DIAGNOSIS — I330 Acute and subacute infective endocarditis: Secondary | ICD-10-CM | POA: Diagnosis present

## 2016-06-11 DIAGNOSIS — D649 Anemia, unspecified: Secondary | ICD-10-CM | POA: Diagnosis present

## 2016-06-11 DIAGNOSIS — D500 Iron deficiency anemia secondary to blood loss (chronic): Secondary | ICD-10-CM | POA: Diagnosis not present

## 2016-06-11 DIAGNOSIS — I5023 Acute on chronic systolic (congestive) heart failure: Secondary | ICD-10-CM | POA: Diagnosis present

## 2016-06-11 DIAGNOSIS — Z90710 Acquired absence of both cervix and uterus: Secondary | ICD-10-CM | POA: Diagnosis not present

## 2016-06-11 DIAGNOSIS — G4733 Obstructive sleep apnea (adult) (pediatric): Secondary | ICD-10-CM | POA: Diagnosis present

## 2016-06-11 DIAGNOSIS — Z888 Allergy status to other drugs, medicaments and biological substances status: Secondary | ICD-10-CM

## 2016-06-11 DIAGNOSIS — I050 Rheumatic mitral stenosis: Secondary | ICD-10-CM | POA: Diagnosis not present

## 2016-06-11 DIAGNOSIS — I272 Pulmonary hypertension, unspecified: Secondary | ICD-10-CM | POA: Diagnosis present

## 2016-06-11 DIAGNOSIS — R0609 Other forms of dyspnea: Secondary | ICD-10-CM

## 2016-06-11 DIAGNOSIS — Z91040 Latex allergy status: Secondary | ICD-10-CM

## 2016-06-11 DIAGNOSIS — I509 Heart failure, unspecified: Secondary | ICD-10-CM

## 2016-06-11 DIAGNOSIS — I10 Essential (primary) hypertension: Secondary | ICD-10-CM | POA: Diagnosis present

## 2016-06-11 DIAGNOSIS — I248 Other forms of acute ischemic heart disease: Secondary | ICD-10-CM | POA: Diagnosis present

## 2016-06-11 DIAGNOSIS — J449 Chronic obstructive pulmonary disease, unspecified: Secondary | ICD-10-CM | POA: Diagnosis present

## 2016-06-11 DIAGNOSIS — Z87891 Personal history of nicotine dependence: Secondary | ICD-10-CM

## 2016-06-11 DIAGNOSIS — I4581 Long QT syndrome: Secondary | ICD-10-CM | POA: Diagnosis present

## 2016-06-11 DIAGNOSIS — I5043 Acute on chronic combined systolic (congestive) and diastolic (congestive) heart failure: Secondary | ICD-10-CM | POA: Diagnosis present

## 2016-06-11 DIAGNOSIS — R32 Unspecified urinary incontinence: Secondary | ICD-10-CM | POA: Diagnosis present

## 2016-06-11 DIAGNOSIS — Z951 Presence of aortocoronary bypass graft: Secondary | ICD-10-CM

## 2016-06-11 DIAGNOSIS — I059 Rheumatic mitral valve disease, unspecified: Secondary | ICD-10-CM | POA: Diagnosis present

## 2016-06-11 DIAGNOSIS — I080 Rheumatic disorders of both mitral and aortic valves: Secondary | ICD-10-CM | POA: Diagnosis present

## 2016-06-11 DIAGNOSIS — I5031 Acute diastolic (congestive) heart failure: Secondary | ICD-10-CM | POA: Diagnosis present

## 2016-06-11 DIAGNOSIS — M6281 Muscle weakness (generalized): Secondary | ICD-10-CM

## 2016-06-11 DIAGNOSIS — N179 Acute kidney failure, unspecified: Secondary | ICD-10-CM | POA: Diagnosis present

## 2016-06-11 DIAGNOSIS — E876 Hypokalemia: Secondary | ICD-10-CM | POA: Diagnosis present

## 2016-06-11 DIAGNOSIS — I4891 Unspecified atrial fibrillation: Secondary | ICD-10-CM | POA: Diagnosis not present

## 2016-06-11 DIAGNOSIS — I110 Hypertensive heart disease with heart failure: Secondary | ICD-10-CM | POA: Diagnosis present

## 2016-06-11 DIAGNOSIS — I480 Paroxysmal atrial fibrillation: Secondary | ICD-10-CM | POA: Diagnosis present

## 2016-06-11 DIAGNOSIS — I5033 Acute on chronic diastolic (congestive) heart failure: Secondary | ICD-10-CM | POA: Diagnosis present

## 2016-06-11 DIAGNOSIS — Z880 Allergy status to penicillin: Secondary | ICD-10-CM

## 2016-06-11 DIAGNOSIS — E785 Hyperlipidemia, unspecified: Secondary | ICD-10-CM | POA: Diagnosis present

## 2016-06-11 DIAGNOSIS — I4510 Unspecified right bundle-branch block: Secondary | ICD-10-CM | POA: Diagnosis present

## 2016-06-11 DIAGNOSIS — E890 Postprocedural hypothyroidism: Secondary | ICD-10-CM | POA: Diagnosis present

## 2016-06-11 DIAGNOSIS — Z881 Allergy status to other antibiotic agents status: Secondary | ICD-10-CM

## 2016-06-11 DIAGNOSIS — I444 Left anterior fascicular block: Secondary | ICD-10-CM | POA: Diagnosis present

## 2016-06-11 DIAGNOSIS — D509 Iron deficiency anemia, unspecified: Secondary | ICD-10-CM | POA: Diagnosis present

## 2016-06-11 DIAGNOSIS — I4819 Other persistent atrial fibrillation: Secondary | ICD-10-CM | POA: Diagnosis present

## 2016-06-11 DIAGNOSIS — I069 Rheumatic aortic valve disease, unspecified: Secondary | ICD-10-CM | POA: Diagnosis not present

## 2016-06-11 DIAGNOSIS — I2510 Atherosclerotic heart disease of native coronary artery without angina pectoris: Secondary | ICD-10-CM | POA: Diagnosis present

## 2016-06-11 DIAGNOSIS — J9622 Acute and chronic respiratory failure with hypercapnia: Secondary | ICD-10-CM | POA: Diagnosis present

## 2016-06-11 DIAGNOSIS — R0600 Dyspnea, unspecified: Secondary | ICD-10-CM

## 2016-06-11 DIAGNOSIS — Z79899 Other long term (current) drug therapy: Secondary | ICD-10-CM | POA: Diagnosis not present

## 2016-06-11 DIAGNOSIS — Z885 Allergy status to narcotic agent status: Secondary | ICD-10-CM

## 2016-06-11 DIAGNOSIS — D508 Other iron deficiency anemias: Secondary | ICD-10-CM | POA: Diagnosis not present

## 2016-06-11 HISTORY — DX: Bacteremia: R78.81

## 2016-06-11 HISTORY — DX: Acute sialoadenitis: K11.21

## 2016-06-11 HISTORY — DX: Unspecified Escherichia coli (E. coli) as the cause of diseases classified elsewhere: B96.20

## 2016-06-11 LAB — CBC WITH DIFFERENTIAL/PLATELET
Basophils Absolute: 0 10*3/uL (ref 0.0–0.1)
Basophils Relative: 1 %
Eosinophils Absolute: 0.2 10*3/uL (ref 0.0–0.7)
Eosinophils Relative: 3 %
HCT: 28.9 % — ABNORMAL LOW (ref 36.0–46.0)
Hemoglobin: 8.5 g/dL — ABNORMAL LOW (ref 12.0–15.0)
Lymphocytes Relative: 27 %
Lymphs Abs: 1.7 10*3/uL (ref 0.7–4.0)
MCH: 23.7 pg — ABNORMAL LOW (ref 26.0–34.0)
MCHC: 29.4 g/dL — ABNORMAL LOW (ref 30.0–36.0)
MCV: 80.7 fL (ref 78.0–100.0)
Monocytes Absolute: 0.6 10*3/uL (ref 0.1–1.0)
Monocytes Relative: 10 %
Neutro Abs: 4 10*3/uL (ref 1.7–7.7)
Neutrophils Relative %: 61 %
Platelets: 336 10*3/uL (ref 150–400)
RBC: 3.58 MIL/uL — ABNORMAL LOW (ref 3.87–5.11)
RDW: 16.9 % — ABNORMAL HIGH (ref 11.5–15.5)
WBC: 6.5 10*3/uL (ref 4.0–10.5)

## 2016-06-11 LAB — BASIC METABOLIC PANEL
Anion gap: 11 (ref 5–15)
BUN: 12 mg/dL (ref 6–20)
CO2: 36 mmol/L — ABNORMAL HIGH (ref 22–32)
Calcium: 9.5 mg/dL (ref 8.9–10.3)
Chloride: 95 mmol/L — ABNORMAL LOW (ref 101–111)
Creatinine, Ser: 0.98 mg/dL (ref 0.44–1.00)
GFR calc Af Amer: >60 mL/min (ref >60)
GFR calc non Af Amer: 55 mL/min — ABNORMAL LOW (ref >60)
Glucose, Bld: 89 mg/dL (ref 65–99)
Potassium: 3.2 mmol/L — ABNORMAL LOW (ref 3.5–5.1)
Sodium: 142 mmol/L (ref 135–145)

## 2016-06-11 LAB — URINALYSIS, ROUTINE W REFLEX MICROSCOPIC
Bilirubin Urine: NEGATIVE
Glucose, UA: NEGATIVE mg/dL
Hgb urine dipstick: NEGATIVE
Ketones, ur: NEGATIVE mg/dL
Leukocytes, UA: NEGATIVE
Nitrite: NEGATIVE
Protein, ur: NEGATIVE mg/dL
Specific Gravity, Urine: 1.005 (ref 1.005–1.030)
pH: 7.5 (ref 5.0–8.0)

## 2016-06-11 LAB — BRAIN NATRIURETIC PEPTIDE: B Natriuretic Peptide: 856.4 pg/mL — ABNORMAL HIGH (ref 0.0–100.0)

## 2016-06-11 LAB — FERRITIN: FERRITIN: 9 ng/mL — AB (ref 11–307)

## 2016-06-11 LAB — ABO/RH: ABO/RH(D): O POS

## 2016-06-11 LAB — MAGNESIUM: MAGNESIUM: 2.1 mg/dL (ref 1.7–2.4)

## 2016-06-11 LAB — TROPONIN I
TROPONIN I: 0.06 ng/mL — AB (ref <0.03)
Troponin I: 0.07 ng/mL (ref <0.03)

## 2016-06-11 MED ORDER — SPIRONOLACTONE 25 MG PO TABS
25.0000 mg | ORAL_TABLET | Freq: Every day | ORAL | Status: DC
  Administered 2016-06-12 – 2016-06-18 (×6): 25 mg via ORAL
  Filled 2016-06-11 (×7): qty 1

## 2016-06-11 MED ORDER — POTASSIUM CHLORIDE CRYS ER 20 MEQ PO TBCR
40.0000 meq | EXTENDED_RELEASE_TABLET | Freq: Two times a day (BID) | ORAL | Status: DC
  Administered 2016-06-11: 40 meq via ORAL
  Filled 2016-06-11 (×2): qty 2

## 2016-06-11 MED ORDER — ISOSORBIDE MONONITRATE ER 30 MG PO TB24
60.0000 mg | ORAL_TABLET | Freq: Every day | ORAL | Status: DC
  Administered 2016-06-11 – 2016-06-12 (×2): 60 mg via ORAL
  Filled 2016-06-11 (×2): qty 2

## 2016-06-11 MED ORDER — FUROSEMIDE 10 MG/ML IJ SOLN
40.0000 mg | Freq: Once | INTRAMUSCULAR | Status: AC
  Administered 2016-06-11: 40 mg via INTRAVENOUS
  Filled 2016-06-11: qty 4

## 2016-06-11 MED ORDER — RIVAROXABAN 20 MG PO TABS
20.0000 mg | ORAL_TABLET | Freq: Every day | ORAL | Status: DC
  Administered 2016-06-11 – 2016-06-16 (×6): 20 mg via ORAL
  Filled 2016-06-11 (×6): qty 1

## 2016-06-11 MED ORDER — AMIODARONE HCL IN DEXTROSE 360-4.14 MG/200ML-% IV SOLN
30.0000 mg/h | INTRAVENOUS | Status: DC
  Administered 2016-06-11 – 2016-06-14 (×4): 30 mg/h via INTRAVENOUS
  Filled 2016-06-11 (×5): qty 200

## 2016-06-11 MED ORDER — LISINOPRIL 2.5 MG PO TABS
2.5000 mg | ORAL_TABLET | Freq: Every day | ORAL | Status: DC
  Administered 2016-06-12 – 2016-06-18 (×7): 2.5 mg via ORAL
  Filled 2016-06-11 (×7): qty 1

## 2016-06-11 MED ORDER — AMIODARONE HCL IN DEXTROSE 360-4.14 MG/200ML-% IV SOLN
60.0000 mg/h | INTRAVENOUS | Status: DC
  Administered 2016-06-11: 60 mg/h via INTRAVENOUS
  Filled 2016-06-11: qty 200

## 2016-06-11 MED ORDER — POTASSIUM CHLORIDE CRYS ER 20 MEQ PO TBCR
40.0000 meq | EXTENDED_RELEASE_TABLET | ORAL | Status: AC
  Administered 2016-06-11: 40 meq via ORAL
  Filled 2016-06-11: qty 2

## 2016-06-11 MED ORDER — SIMVASTATIN 40 MG PO TABS
60.0000 mg | ORAL_TABLET | Freq: Every day | ORAL | Status: DC
  Administered 2016-06-12 – 2016-06-17 (×6): 60 mg via ORAL
  Filled 2016-06-11 (×6): qty 2

## 2016-06-11 MED ORDER — METOPROLOL TARTRATE 50 MG PO TABS
50.0000 mg | ORAL_TABLET | Freq: Two times a day (BID) | ORAL | Status: DC
  Administered 2016-06-11 – 2016-06-16 (×8): 50 mg via ORAL
  Filled 2016-06-11 (×10): qty 1

## 2016-06-11 MED ORDER — POTASSIUM CHLORIDE CRYS ER 20 MEQ PO TBCR
40.0000 meq | EXTENDED_RELEASE_TABLET | Freq: Once | ORAL | Status: AC
  Administered 2016-06-11: 40 meq via ORAL
  Filled 2016-06-11: qty 2

## 2016-06-11 MED ORDER — IPRATROPIUM-ALBUTEROL 0.5-2.5 (3) MG/3ML IN SOLN
3.0000 mL | Freq: Once | RESPIRATORY_TRACT | Status: AC
  Administered 2016-06-11: 3 mL via RESPIRATORY_TRACT
  Filled 2016-06-11: qty 3

## 2016-06-11 MED ORDER — POTASSIUM CHLORIDE CRYS ER 20 MEQ PO TBCR: 40.0000 meq | EXTENDED_RELEASE_TABLET | ORAL | Status: DC

## 2016-06-11 NOTE — ED Notes (Signed)
Report attempted 

## 2016-06-11 NOTE — ED Notes (Signed)
Family at bedside. 

## 2016-06-11 NOTE — ED Notes (Signed)
CRITICAL VALUE ALERT  Critical value received:  Troponin 0.07  Date of notification:  06-11-16  Time of notification:  1545  Critical value read back:Yes.     Dr. Juleen China made aware.

## 2016-06-11 NOTE — ED Provider Notes (Signed)
[facility]-EMERGENCY DEPT Provider Note   CSN: [id number] Arrival date & time: 06/11/16  1242  By signing my name below, I, [name], attest that this documentation has been prepared under the direction and in the presence of [name], MD. Electronically Signed: Rosario Lloyd, ED Scribe. 06/11/16. 2:04 PM.  History   Chief Complaint Chief Complaint  Patient presents with  . Weakness   The history is provided by the patient and a relative. No language interpreter was used.    HPI Comments: [name] is a 75 y.o. female BIB EMS, with a PMHx of CHF, COPD, and CAD, who presents to the Emergency Department complaining of unchanged, constant diffuse weakness and fatigue onset over the past several weeks. Pt reports associated intermittent episodes of centralized chest pain with radiation into her bilateral shoulder blades which began last night as well, and a gradual weight loss change of approximately 3lbs over the past several weeks as well. She states that she is unable to ambulate long distances due to her diffuse weakness. Pt reports that she was called 3 days ago from a f/u visit with her PCP, with basic lab work and an EKG performed, notifying that her RBC had lowered from 9.3 to 8 from previous labwork. She started taking iron supplements for this issue approximately 3 days ago which she reports improved her fatigue slightly, however, her weakness has since worsened significantly again. Pt took two NTG last night with moderate relief of her current chest pain, however, she notes that it has since worsened again. Her shortness of breath is exacerbated with laying supine. Her fatigue and weakness is exacerbated also with periods of mild ambulation and activity. Pt has had increased belching and passing of gas since the onset of her symptoms. Pt was given NSL fluid bolus while en route, however, no other interventions were given. She notes that she has been also having  intermittent episodes of epistaxis over the past week as well which all have resolved on their own. Pt ambulates with a walker at baseline. She is oxygen dependent at home at 2L via [place]. Pt is on Xarelto therapy daily. She denies any worsening leg swelling, or any other associated symptoms.   Past Medical History:  Diagnosis Date  . Anxiety   . Bursitis, shoulder    bilateral  . CHF (congestive heart failure) (HCC)    chronic diasotlic CHF  . Complication of anesthesia    "have trouble getting me awake sometimes; been ok latelhy" (09/24/2015)  . COPD (chronic obstructive pulmonary disease) (HCC)   . Coronary heart disease 2001   s/p CABG  . Depression   . DJD (degenerative joint disease)   . GERD (gastroesophageal reflux disease)   . H/O hiatal hernia   . History of rheumatic heart disease    X 3-LAST TIME WHEN PATIENT WAS 75 YRS OLD  . Hyperlipidemia   . Hypertension   . OA (osteoarthritis)   . On home oxygen therapy    "2L; 24/7" (09/24/2015)  . PUD (peptic ulcer disease)   . Pulmonary HTN    PASP 50-23mmHg by [id number]  . Pulmonary HTN   . Sleep apnea    intolerant to CPAP  . Thyroid nodule   . Urinary incontinence   . Varicose veins     Patient Active Problem List   Diagnosis Date Noted  . Chronic respiratory failure with hypoxia (HCC) 04/20/2016  . Acute parotitis 01/05/2016  . Sepsis secondary to UTI (HCC)   .  Bacteremia, escherichia coli   . Rheumatic mitral valve disease   . PFO (patent foramen ovale)   . Cardiomyopathy (HCC)   . Sepsis, unspecified organism (HCC)   . Urinary tract infection, site not specified   . Bacteremia   . Chronic diastolic CHF (congestive heart failure) (HCC)   . Chronic atrial fibrillation (HCC)   . Alcoholic cardiomyopathy (HCC)   . Acute kidney injury (HCC)   . Sepsis (HCC) 11/16/2015  . Leukocytosis 11/16/2015  . SIRS (systemic inflammatory response syndrome) (HCC)   . Acute pyelonephritis   . Acute on chronic respiratory  failure with hypoxia and hypercapnia (HCC)   . Acute diastolic congestive heart failure (HCC)   . Hypokalemia 10/03/2015  . AKI (acute kidney injury) (HCC) 10/03/2015  . Weakness   . Acute on chronic diastolic (congestive) heart failure 09/24/2015  . Acute diastolic (congestive) heart failure 09/24/2015  . Aortic valve vegetation 09/24/2015  . Lumbar spine scoliosis 04/18/2015  . Mitral stenosis 02/10/2015  . Chronic diastolic heart failure (HCC) 05/10/2013  . Atrial flutter (HCC) 05/10/2013  . Chronic anticoagulation 05/10/2013  . HTN (hypertension) 05/10/2013  . Dyslipidemia, goal LDL below 70 05/10/2013  . Seasonal allergic rhinitis 04/11/2012  . Obstructive sleep apnea 04/28/2008  . DYSPNEA 03/14/2008  . Coronary atherosclerosis 02/27/2008  . COPD mixed type (HCC) 02/27/2008  . Pulmonary hypertension (HCC) 02/21/2008  . DIASTOLIC DYSFUNCTION 02/21/2008   Past Surgical History:  Procedure Laterality Date  . BILATERAL SALPINGOOPHORECTOMY    . CARDIAC CATHETERIZATION  02/11/2008, 01/30/2004, 06/14/2000  . CARDIAC CATHETERIZATION N/A 09/24/2015   Procedure: Right Heart Cath;  Surgeon: Laurey Moralealton S McLean, MD;  [facility];  Service: Cardiovascular;  Laterality: N/A;  . CARDIAC CATHETERIZATION N/A 02/08/2016   Procedure: Right/Left Heart Cath and Coronary/Graft Angiography;  Surgeon: Laurey Moralealton S McLean, MD;  [facility];  Service: Cardiovascular;  Laterality: N/A;  . CORONARY ARTERY BYPASS GRAFT  06/21/2000   x4  . INCONTINENCE SURGERY     "tacked"  . TEE WITHOUT CARDIOVERSION N/A 09/24/2015   Procedure: TRANSESOPHAGEAL ECHOCARDIOGRAM (TEE);  Surgeon: Laurey Moralealton S McLean, MD;  [facility];  Service: Cardiovascular;  Laterality: N/A;  . THYROIDECTOMY, PARTIAL  2010   Dr Michaell CowingGross  . TOTAL ABDOMINAL HYSTERECTOMY  1990   1990  . TUBAL LIGATION    . VAGINAL DELIVERY  x4   OB History    No data available     Home Medications    Prior to Admission  medications   Medication Sig Start Date End Date Taking? Authorizing Provider  acetaminophen (TYLENOL) 500 MG tablet Take 1,000 mg by mouth 2 (two) times daily as needed for headache.    Yes Historical Provider, MD  calcium-vitamin D (OSCAL WITH D) 500-200 MG-UNIT per tablet Take 2 tablets by mouth daily with breakfast.   Yes Historical Provider, MD  IRON PO Take 1 tablet by mouth daily.   Yes Historical Provider, MD  isosorbide mononitrate (IMDUR) 60 MG 24 hr tablet Take 1.5 tablets (90 mg total) by mouth daily. Patient taking differently: Take 60 mg by mouth daily.  06/06/16  Yes Laurey Moralealton S McLean, MD  loratadine (CLARITIN) 10 MG tablet Take 10 mg by mouth at bedtime as needed for allergies.    Yes Historical Provider, MD  Menthol, Topical Analgesic, (MINERAL ICE EX) Apply 1 application topically as needed (for pain).   Yes Historical Provider, MD  metoprolol (LOPRESSOR) 50 MG tablet Take 1 tablet (50 mg total)  by mouth 2 (two) times daily. 04/18/16  Yes Bevelyn Buckles Bensimhon, MD  NITROSTAT 0.4 MG SL tablet place 1 tablet under the tongue if needed every 5 minutes for chest pain for 3 doses IF NO RELIEF AFTER 3RD DOSE CALL PRESCRIBER OR 911. 01/21/16  Yes Quintella Reichert, MD  omeprazole (PRILOSEC OTC) 20 MG tablet Take 20 mg by mouth at bedtime as needed (GERD).    Yes Historical Provider, MD  OXYGEN Inhale 2-3 L into the lungs continuous.    Yes Historical Provider, MD  potassium chloride SA (K-DUR,KLOR-CON) 20 MEQ tablet Take 2 tablets (40 mEq total) by mouth 2 (two) times daily. 01/15/16  Yes Laurey Morale, MD  rivaroxaban (XARELTO) 20 MG TABS tablet Take 1 tablet (20 mg total) by mouth daily with supper. 02/29/16  Yes Graciella Freer, PA-C  sertraline (ZOLOFT) 100 MG tablet Take 100 mg by mouth at bedtime.    Yes Historical Provider, MD  simvastatin (ZOCOR) 40 MG tablet TAKE 1 TABLET BY MOUTH  DAILY AT 6 PM Patient taking differently: TAKE 40 MG BY MOUTH  DAILY AT 6 PM 06/03/16  Yes Laurey Morale, MD  spironolactone (ALDACTONE) 25 MG tablet take 1 tablet by mouth once daily Patient taking differently: Take 25 mg by mouth once daily 04/28/16  Yes Graciella Freer, PA-C  torsemide (DEMADEX) 20 MG tablet Take 3 tablets (60 mg total) by mouth 2 (two) times daily. 05/05/16  Yes Laurey Morale, MD   Family History Family History  Problem Relation Age of Onset  . Anemia Mother   . Emphysema Father   . Esophageal cancer Daughter    Social History Social History  Substance Use Topics  . Smoking status: Former Smoker    Packs/day: 1.00    Years: 47.00    Types: Cigarettes    Quit date: 07/26/2007  . Smokeless tobacco: Never Used  . Alcohol use No   Allergies   Prednisone; Tape; Xopenex [levalbuterol]; Cefpodoxime; Codeine; Lipitor [atorvastatin]; Other; Penicillins; Ventolin [kdc:albuterol]; Anoro ellipta [umeclidinium-vilanterol]; Clindamycin; Hydrocodone-acetaminophen; Latex; and Levalbuterol hcl  Review of Systems Review of Systems  Constitutional: Positive for fatigue (generalized) and unexpected weight change.  HENT: Positive for nosebleeds.   Respiratory: Positive for shortness of breath (baseline).   Cardiovascular: Positive for chest pain. Negative for leg swelling.  Musculoskeletal: Positive for back pain (radiation of CP between bilateral shoulder blades).  Neurological: Positive for weakness (diffuse).  All other systems reviewed and are negative.  Physical Exam Updated Vital Signs BP 129/97   Pulse 94   Resp 17   Ht 5\' 6"  (1.676 m)   Wt 177 lb 3.2 oz (80.4 kg)   SpO2 100%   BMI 28.60 kg/m   Physical Exam  Constitutional: She appears well-developed and well-nourished.  Appears tired, non-toxic.   HENT:  Head: Normocephalic.  Right Ear: External ear normal.  Left Ear: External ear normal.  Nose: Nose normal.  Mouth/Throat: Oropharynx is clear and moist.  Eyes: Conjunctivae are normal. Right eye exhibits no discharge. Left eye exhibits no  discharge.  Neck: Normal range of motion.  Cardiovascular: Normal heart sounds.  Tachycardia present.   No murmur heard. irregularly irregular. Mildly tachycardic.   Pulmonary/Chest: Effort normal. No respiratory distress. She has no wheezes. She has no rales.  Breath sounds are diminished bilaterally.   Abdominal: Soft. She exhibits no distension. There is no tenderness. There is no rebound and no guarding.  Musculoskeletal: Normal range of motion. She  exhibits edema. She exhibits no tenderness.  Mild symmetric bilat LE edema.   Neurological: She is alert. No cranial nerve deficit. Coordination normal.  Skin: Skin is warm and dry. No rash noted. No erythema.  Psychiatric: She has a normal mood and affect. Her behavior is normal.  Nursing note and vitals reviewed.  ED Treatments / Results  DIAGNOSTIC STUDIES: Oxygen Saturation is 99% on RA, normal by my interpretation.   COORDINATION OF CARE: 1:26 PM-Discussed next steps with pt. Pt verbalized understanding and is agreeable with the plan.   Labs (all labs ordered are listed, but only abnormal results are displayed) Labs Reviewed  CBC WITH DIFFERENTIAL/PLATELET - Abnormal; Notable for the following:       Result Value   RBC 3.58 (*)    Hemoglobin 8.5 (*)    HCT 28.9 (*)    MCH 23.7 (*)    MCHC 29.4 (*)    RDW 16.9 (*)    All other components within normal limits  BRAIN NATRIURETIC PEPTIDE - Abnormal; Notable for the following:    B Natriuretic Peptide 856.4 (*)    All other components within normal limits  BASIC METABOLIC PANEL - Abnormal; Notable for the following:    Potassium 3.2 (*)    Chloride 95 (*)    CO2 36 (*)    GFR calc non Af Amer 55 (*)    All other components within normal limits  TROPONIN I - Abnormal; Notable for the following:    Troponin I 0.07 (*)    All other components within normal limits  URINALYSIS, ROUTINE W REFLEX MICROSCOPIC (NOT AT [facility])  TYPE AND SCREEN  ABO/RH    EKG  EKG  Interpretation None      EKG:  Rhythm: atrial fibrillation Rate: 89 QRS: 160 ms Bifasicular block ST segments: NS ST changes   Radiology Dg Chest 2 View  Result Date: 06/11/2016 CLINICAL DATA:  Short of breath for 1 month EXAM: CHEST  2 VIEW COMPARISON:  11/17/2015 FINDINGS: Heart is moderately enlarged. Mild vascular congestion. No interstitial edema. No consolidation or mass. No pneumothorax or pleural effusion. Stable L1 compression fracture. IMPRESSION: Cardiomegaly and mild vascular congestion. Electronically Signed   By: Jolaine Click M.D.   On: 06/11/2016 14:39    Procedures Procedures  Medications Ordered in ED Medications - No data to display  Initial Impression / Assessment and Plan / ED Course  I have reviewed the triage vital signs and the nursing notes.  Pertinent labs & imaging results that were available during my care of the patient were reviewed by me and considered in my medical decision making (see chart for details).  Clinical Course    75yF primarily with dyspnea and generalized fatigue. Intermittent CP. Hx of CAD s/p CABG. Most recent cath with 90% stenosis in distal LCx but not amenable to PCI and medically managed. Troponin is minimally elevated today. Currently pain free.  She is on xarelto. Aspirin deferred. Not sure what to make of troponin in setting of chronic HF. Minimal peripheral edema and she reports her weight is actually down a couple pounds over the last few days but BNP is up and some congestion on CXR.  EKG not crossing into MUSE, but appears to be in afib. This could certainly be contributing.  Problem list includes what appears to be distant hx of atrial flutter. Last few tracings have been sinus. Rate is reasonable around 90-100. Already anticoagulated.    I'm not sure ho much of  her dyspnea is cardiac versus her underlying lung disease. I do not appreciate wheezing on my exam. She is anemic, but this is chronic/stable.   Final Clinical  Impressions(s) / ED Diagnoses   Final diagnoses:  Exertional dyspnea  Atrial fibrillation, unspecified type (HCC)  Heart failure, unspecified heart failure chronicity, unspecified heart failure type [facility])   New Prescriptions New Prescriptions   No medications on file   I personally preformed the services scribed in my presence. The recorded information has been reviewed is accurate. [name], MD.      [name], MD 06/11/16 [phone]

## 2016-06-11 NOTE — Consult Note (Addendum)
CARDIOLOGY CONSULT NOTE  Patient ID: Monica GoresJoyce R Machuca, MRN: [id number], DOB/AGE: 09-20-1940 75 y.o. Admit date: 06/11/2016 Date of Consult: 06/11/2016  Primary Physician: Astrid DivineGRIFFIN,[name], MD Primary Cardiologist: [facility] Consulting Physician [facility]  Chief Complaint: SOB and chest pain    HPI [name] is a 75 y.o. female admitted with worsening SOB She had been seen 5 days ago and labs notable For a chronically low but lower hemoglobin now at the 8.0 range. More importantly, her ECG demonstrated sinus rhythm with ectopy.    She has a complicated past cardiac history. She has a history of coronary artery disease with prior bypass. Catheterization 7/17 demonstrated patent bypass grafts but a 90% lesion in the distal circumflex that was small and not felt amenable to PCI. 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  She also has pulmonary hypertension. PA pressure 6/16 68. 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 This was most recently assessed 3/17 transthoracic echo 2/17 mean mitral gradient 6 severe LAE greater than RAE. LV function was normal.  Right heart cath 3/17 PA pressure 71 with PVR 2.7  echo also demonstrated an aortic valve vegetation concerning for endocarditis. ID was consulted and felt to be contaminant.       Past Medical History:  Diagnosis Date  . Anxiety   . Bursitis, shoulder    bilateral  . CHF (congestive heart failure) (HCC)    chronic diasotlic CHF  . Complication of anesthesia    "have trouble getting me awake sometimes; been ok latelhy" (09/24/2015)  . COPD (chronic obstructive pulmonary disease) (HCC)   . Coronary heart disease 2001   s/p CABG  . Depression   . DJD (degenerative joint disease)   . GERD (gastroesophageal reflux disease)   . H/O hiatal hernia   . History of rheumatic heart disease    X 3-LAST TIME WHEN PATIENT WAS 75 YRS OLD  . Hyperlipidemia   . Hypertension   . OA (osteoarthritis)    . On home oxygen therapy    "2L; 24/7" (09/24/2015)  . PUD (peptic ulcer disease)   . Pulmonary HTN    PASP 50-4155mmHg by [id number]/[id number]/2014  . Pulmonary HTN   . Sleep apnea    intolerant to CPAP  . Thyroid nodule   . Urinary incontinence   . Varicose veins       Surgical History:  Past Surgical History:  Procedure Laterality Date  . BILATERAL SALPINGOOPHORECTOMY    . CARDIAC CATHETERIZATION  02/11/2008, 01/30/2004, 06/14/2000  . CARDIAC CATHETERIZATION N/A 09/24/2015   Procedure: Right Heart Cath;  Surgeon: Laurey Moralealton S McLean, MD;  [facility];  Service: Cardiovascular;  Laterality: N/A;  . CARDIAC CATHETERIZATION N/A 02/08/2016   Procedure: Right/Left Heart Cath and Coronary/Graft Angiography;  Surgeon: Laurey Moralealton S McLean, MD;  [facility];  Service: Cardiovascular;  Laterality: N/A;  . CORONARY ARTERY BYPASS GRAFT  06/21/2000   x4  . INCONTINENCE SURGERY     "tacked"  . TEE WITHOUT CARDIOVERSION N/A 09/24/2015   Procedure: TRANSESOPHAGEAL ECHOCARDIOGRAM (TEE);  Surgeon: Laurey Moralealton S McLean, MD;  [facility];  Service: Cardiovascular;  Laterality: N/A;  . THYROIDECTOMY, PARTIAL  2010   Dr Michaell CowingGross  . TOTAL ABDOMINAL HYSTERECTOMY  1990   1990  . TUBAL LIGATION    . VAGINAL DELIVERY  x4     Home Meds: Prior to Admission medications   Medication Sig Start Date End  Date Taking? Authorizing Provider  acetaminophen (TYLENOL) 500 MG tablet Take 1,000 mg by mouth 2 (two) times daily as needed for headache.    Yes Historical Provider, MD  calcium-vitamin D (OSCAL WITH D) 500-200 MG-UNIT per tablet Take 2 tablets by mouth daily with breakfast.   Yes Historical Provider, MD  IRON PO Take 1 tablet by mouth daily.   Yes Historical Provider, MD  isosorbide mononitrate (IMDUR) 60 MG 24 hr tablet Take 1.5 tablets (90 mg total) by mouth daily. Patient taking differently: Take 60 mg by mouth daily.  06/06/16  Yes Laurey Morale, MD  loratadine (CLARITIN) 10 MG tablet Take  10 mg by mouth at bedtime as needed for allergies.    Yes Historical Provider, MD  Menthol, Topical Analgesic, (MINERAL ICE EX) Apply 1 application topically as needed (for pain).   Yes Historical Provider, MD  metoprolol (LOPRESSOR) 50 MG tablet Take 1 tablet (50 mg total) by mouth 2 (two) times daily. 04/18/16  Yes Bevelyn Buckles Bensimhon, MD  NITROSTAT 0.4 MG SL tablet place 1 tablet under the tongue if needed every 5 minutes for chest pain for 3 doses IF NO RELIEF AFTER 3RD DOSE CALL PRESCRIBER OR 911. 01/21/16  Yes Quintella Reichert, MD  omeprazole (PRILOSEC OTC) 20 MG tablet Take 20 mg by mouth at bedtime as needed (GERD).    Yes Historical Provider, MD  OXYGEN Inhale 2-3 L into the lungs continuous.    Yes Historical Provider, MD  potassium chloride SA (K-DUR,KLOR-CON) 20 MEQ tablet Take 2 tablets (40 mEq total) by mouth 2 (two) times daily. 01/15/16  Yes Laurey Morale, MD  rivaroxaban (XARELTO) 20 MG TABS tablet Take 1 tablet (20 mg total) by mouth daily with supper. 02/29/16  Yes Graciella Freer, PA-C  sertraline (ZOLOFT) 100 MG tablet Take 100 mg by mouth at bedtime.    Yes Historical Provider, MD  simvastatin (ZOCOR) 40 MG tablet TAKE 1 TABLET BY MOUTH  DAILY AT 6 PM Patient taking differently: TAKE 40 MG BY MOUTH  DAILY AT 6 PM 06/03/16  Yes Laurey Morale, MD  spironolactone (ALDACTONE) 25 MG tablet take 1 tablet by mouth once daily Patient taking differently: Take 25 mg by mouth once daily 04/28/16  Yes Graciella Freer, PA-C  torsemide (DEMADEX) 20 MG tablet Take 3 tablets (60 mg total) by mouth 2 (two) times daily. 05/05/16  Yes Laurey Morale, MD    Inpatient Medications:   Allergies:  Allergies  Allergen Reactions  . Prednisone Rash    jittery  . Tape Other (See Comments)    Bleeding  . Xopenex [Levalbuterol] Other (See Comments)    Made mouth and throat sore  . Cefpodoxime Other (See Comments)    Yeast infections   . Codeine Other (See Comments)    Anxious/ jittery   . Lipitor [Atorvastatin] Other (See Comments)    Made joint start hurting/ Muscle aches  . Other Swelling    Venholin HFA  . Penicillins Other (See Comments)    Has patient had a PCN reaction causing immediate rash, facial/tongue/throat swelling, SOB or lightheadedness with hypotension: unknown, childhood reaction Has patient had a PCN reaction causing severe rash involving mucus membranes or skin necrosis: No Has patient had a PCN reaction that required hospitalization No Has patient had a PCN reaction occurring within the last 10 years: No If all of the above answers are "NO", then may proceed with Cephalosporin use.   Terald Sleeper [Kdc:Albuterol] Other (  See Comments)    "jittery"  . Anoro Ellipta [Umeclidinium-Vilanterol]     itchy  . Clindamycin Other (See Comments)    Unknown   . Hydrocodone-Acetaminophen Anxiety  . Latex Itching and Rash  . Levalbuterol Hcl Other (See Comments)    Unknown     Social History   Social History  . Marital status: Married    Spouse name: N/A  . Number of children: N/A  . Years of education: N/A   Occupational History  . Not on file.   Social History Main Topics  . Smoking status: Former Smoker    Packs/day: 1.00    Years: 47.00    Types: Cigarettes    Quit date: 07/26/2007  . Smokeless tobacco: Never Used  . Alcohol use No  . Drug use: No  . Sexual activity: No   Other Topics Concern  . Not on file   Social History Narrative   Husband bilateral left amputee for atheorsclerotic disease     Family History  Problem Relation Age of Onset  . Anemia Mother   . Emphysema Father   . Esophageal cancer Daughter      ROS:  Please see the history of present illness.     All other systems reviewed and negative.    Physical Exam:  Blood pressure 117/79, pulse 98, temperature 98.1 F (36.7 C), resp. rate 21, height 5\' 6"  (1.676 m), weight 177 lb 3.2 oz (80.4 kg), SpO2 100 %. General: Well developed, well nourished female in no acute  distress. Head: Normocephalic, atraumatic, sclera non-icteric, no xanthomas, nares are without discharge. EENT: normal  Lymph Nodes:  none Neck: Negative for carotid bruits. JVD >10 Back:without scoliosis kyphosis  Lungs: Cracklers Heart irregularly irregular rapid rate and rhythm with a 3  /6 systolic murmur the magnitude of which changes with RR intervals. No rubs, or gallops appreciated. Abdomen: Soft, non-tender, non-distended with normoactive bowel sounds. No hepatomegaly. No rebound/guarding. No obvious abdominal masses. Msk:  Strength and tone appear normal for age. Extremities: No clubbing or cyanosis.  1+  edema.  Distal pedal pulses are 2+ and equal bilaterally. Skin: Warm and Dry Neuro: Alert and oriented X 3. CN III-XII intact Grossly normal sensory and motor function . Psych:  Responds to questions appropriately with a normal affect.      Labs: Cardiac Enzymes  Recent Labs  06/11/16 1442  TROPONINI 0.07*   CBC Lab Results  Component Value Date   WBC 6.5 06/11/2016   HGB 8.5 (L) 06/11/2016   HCT 28.9 (L) 06/11/2016   MCV 80.7 06/11/2016   PLT 336 06/11/2016   PROTIME: No results for input(s): LABPROT, INR in the last 72 hours. Chemistry  Recent Labs Lab 06/11/16 1442  NA 142  K 3.2*  CL 95*  CO2 36*  BUN 12  CREATININE 0.98  CALCIUM 9.5  GLUCOSE 89   Lipids Lab Results  Component Value Date   CHOL 168 05/05/2016   HDL 71 05/05/2016   LDLCALC 78 05/05/2016   TRIG 93 05/05/2016   BNP Pro B Natriuretic peptide (BNP)  Date/Time Value Ref Range Status  01/09/2015 09:07 AM 245.0 (H) 0.0 - 100.0 pg/mL Final  11/04/2011 07:45 AM 449.4 (H) 0 - 125 pg/mL Final  11/02/2011 12:45 PM 609.1 (H) 0 - 125 pg/mL Final   Thyroid Function Tests: No results for input(s): TSH, T4TOTAL, T3FREE, THYROIDAB in the last 72 hours.  Invalid input(s): FREET3 Miscellaneous Lab Results  Component Value Date  DDIMER 0.36 02/25/2013    Radiology/Studies:  Dg  Chest 2 View  Result Date: 06/11/2016 CLINICAL DATA:  Short of breath for 1 month EXAM: CHEST  2 VIEW COMPARISON:  11/17/2015 FINDINGS: Heart is moderately enlarged. Mild vascular congestion. No interstitial edema. No consolidation or mass. No pneumothorax or pleural effusion. Stable L1 compression fracture. IMPRESSION: Cardiomegaly and mild vascular congestion. Electronically Signed   By: Jolaine Click M.D.   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 This unfortunately is not a surprise with massive left atrial enlargement and the potential success of cardioversion is concerning as in the context of mitral stenosis atrial fibrillation has a large hemodynamic impact.   It may be that the hypokalemia has made her electrically less stable and contributed to her atrial fibrillation. Repletion will be important.  It may well be, because as best as I can tell that atrial fibrillation has not required cardioversion in some time, that if she reverts to atrial fibrillation in the short-term that amiodarone not withstanding her lung disease would be appropriate.  I have arranged for cardioversion tomorrow morning at 8:00.   Blood pressure is low today, this is likely a consequence of the hemodynamics. I will use intravenous amiodarone in the short-term to try to slow her heart rate down which will improve this. Beta blockers and calcium blockers unfortunately are more likely to aggravate her hypotension  I wonder also whether her anemia needs further evaluation. I cannot find an  up-to-date assessment. Ferritin levels 3/17 were borderline low at 16.  Sherryl Manges

## 2016-06-11 NOTE — ED Triage Notes (Signed)
Pt here for evaluation of weakness. Pt was seen at [facility] with a low K and low iron. Pt has hx of irregular heart rate. HR 114. BP 90s systolic initially. Given fluids by EMS. BP 116/60. Pt on 2L via nasal cannula. CBG 189.

## 2016-06-11 NOTE — H&P (Signed)
History and Physical    [name]:[id number] DOB: 05-05-1941 DOA: 06/11/2016  PCP: Astrid Divine, MD   Patient coming from: Home  Chief Complaint: Dyspnea  HPI: [name] is a 75 y.o. female with medical history significant of severe COPD, CAD s/p CABG, GERD, h/o rheumatic heart disease, Pulmonary hypertension who presents to the ED for worsening shortness of breath.  Last night she had significant chest pain .  Admitted in February/ March for something similar.  Patient states that her pulmonologist does not think her symptoms are related to her COPD as her oxygen level has been good while wearing supplemental oxygen.  She mentions her cardiologist has been running all sorts of tests in order to figure out what is wrong with her.  She had bloodwork done 6 days ago on 06/06/16 and checked her blood levels, BNP and did an EKG when at the cardiologist office.  4 days prior to admission patient was out for a majority of the day and then was so tired she could barely walk into the house.  The next day (3 days prior to admission) she received a phone call stating she was anemic (blood count decreasing from 9.3 to 8.0).  She did get iron pills which she started taking.  Last night though she walked from her kitchen to the bathroom her body gave out.  She then says that she was barely making it from one room to another.  Her chest pain became so extreme that she needed to take nitroglycerin pills.  She does report improvement with the nitroglycerin.  She waited 8 minutes and then took another nitroglycerin.  She reports the chest pain did improve but she had pain in her shoulders and back.  This morning she woke up feeling stronger but then reports as soon as she got up she almost immediately felt weaker.  She says she can barely have a conversation due to weakness and dyspnea.  The chest pain is described as pressure across the whole chest and did radiate to her neck and upper back.  Chest  pain is better today- patient rates it as a 6/10.  Reading through last heart failure note on 11/13 patient has had a history of cardiac investigation for her dyspnea and chest pain.  An portion of Cardiology's note is below.  Admitted 3/2 - 09/30/15 with A/C CHF. TEE was done to assess mitral stenosis, appeared mild to moderate, rheumatic valve.  TEE also showed aortic valve vegetation and concern arose for endocarditis with recent "tooth infection" per pt.  ID saw. 1 of 2 admission cultures with Gram + cocci in clusters thought to be contaminant.  2 further sets of BCx negative. RHC showed elevated right and left heart filling pressures.  Aortic valve was not crossed for full mitral stenosis assessment due to aortic valve vegetation. Diuresed 8 lbs. Discharge weight 175 lbs.   Due to increased dyspnea and exertional chest pain, she had RHC/LHC in 7/17.  Bypass grafts were patent but there was 90% stenosis in the distal LCx that could account for angina.  It was a small caliber vessel and not amenable to PCI. Filling pressures were near-normal on RHC.   - ECHO 12/2014 EF 55-60%, peak PA pressure 68 mmHg, mild to moderate MS. - ECHO 2/17 EF 60-65%, mild LVH, mildly dilated RV with mildly decreased systolic function, PASP 29 mmHg (not sure we got a full envelope), moderate mitral stenosis with mean gradient 6 mmHg and MVA 1.37  cm^2, mild MR, mild AI, aortic sclerosis without significant stenosis.   - TEE 09/24/15 EF 60%, small 0.8 cm mobile mass on the LV side of the aortic valve. Mild AI and AS. 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 A small PFO with a weakly positive bubble study, Moderate TR  PFTs (2/17): moderate-severe obstructive airways disease.   Sleep study 2017 was negative, no significant OSA.   RHC 09/24/15 (did not do LHC and cross valve to assess mitral stenosis due to aortic valve vegetation): Hemodynamics (mmHg) RA mean 16 RV 65/14 PA  71/28, mean 45 PCWP mean 32 Cardiac Output (Fick) 4.85  Cardiac Index (Fick) 2.51 PVR 2.68 WU  LHC/RHC (7/17): 50% pLAD, patent LIMA-LAD, totally occluded PLOM, patent SVG-PLOM, AV LCx distal to PLOM with 90% stenosis (small caliber), totally occluded PLV, patent SVG-PLV.  RA mean 5 PA 43/13 PCWP mean 17 CI 2.7 PVR 1.9 WU Transient CHB when LV was entered, so unable to measure gradient across MV  ED Course: Patient was seen by EDP.  Baseline lab data showed hypokalemia, anemia, elevated troponin, and BNP almost twice what it was at her visit 5-6 days prior to presentation.  Cardiology was consulted and will see patient for evaluation of her heart failure as well as her atrial fibrillation (as patient was previously in sinus rhythm in last few EKG's).  Review of Systems: Patient denies fever, cough (productive and nonproductive), hematuria, hematochezia or melena.  Does report increase in hearing loss.  Denies syncope, denies palpitations, abdominal pain, edema.  Does also report some reflux that is not new.   Past Medical History:  Diagnosis Date  . Anxiety   . Bursitis, shoulder    bilateral  . CHF (congestive heart failure) (HCC)    chronic diasotlic CHF  . Complication of anesthesia    "have trouble getting me awake sometimes; been ok latelhy" (09/24/2015)  . COPD (chronic obstructive pulmonary disease) (HCC)   . Coronary heart disease 2001   s/p CABG  . Depression   . DJD (degenerative joint disease)   . GERD (gastroesophageal reflux disease)   . H/O hiatal hernia   . History of rheumatic heart disease    X 3-LAST TIME WHEN PATIENT WAS 75 YRS OLD  . Hyperlipidemia   . Hypertension   . OA (osteoarthritis)   . On home oxygen therapy    "2L; 24/7" (09/24/2015)  . PUD (peptic ulcer disease)   . Pulmonary HTN    PASP 50-43mmHg by [id number]  . Pulmonary HTN   . Sleep apnea    intolerant to CPAP  . Thyroid nodule   . Urinary incontinence   . Varicose veins     Past  Surgical History:  Procedure Laterality Date  . BILATERAL SALPINGOOPHORECTOMY    . CARDIAC CATHETERIZATION  02/11/2008, 01/30/2004, 06/14/2000  . CARDIAC CATHETERIZATION N/A 09/24/2015   Procedure: Right Heart Cath;  Surgeon: Laurey Morale, MD;  Location: [facility] INVASIVE CV LAB;  Service: Cardiovascular;  Laterality: N/A;  . CARDIAC CATHETERIZATION N/A 02/08/2016   Procedure: Right/Left Heart Cath and Coronary/Graft Angiography;  Surgeon: Laurey Morale, MD;  Location: [facility] INVASIVE CV LAB;  Service: Cardiovascular;  Laterality: N/A;  . CORONARY ARTERY BYPASS GRAFT  06/21/2000   x4  . INCONTINENCE SURGERY     "tacked"  . TEE WITHOUT CARDIOVERSION N/A 09/24/2015   Procedure: TRANSESOPHAGEAL ECHOCARDIOGRAM (TEE);  Surgeon: Laurey Morale, MD;  Location: [facility] ENDOSCOPY;  Service: Cardiovascular;  Laterality: N/A;  . THYROIDECTOMY, PARTIAL  2010   Dr Michaell CowingGross  . TOTAL ABDOMINAL HYSTERECTOMY  1990   1990  . TUBAL LIGATION    . VAGINAL DELIVERY  x4     reports that she quit smoking about 8 years ago. Her smoking use included Cigarettes. She has a 47.00 pack-year smoking history. She has never used smokeless tobacco. She reports that she does not drink alcohol or use drugs.  Allergies  Allergen Reactions  . Prednisone Rash    jittery  . Tape Other (See Comments)    Bleeding  . Xopenex [Levalbuterol] Other (See Comments)    Made mouth and throat sore  . Cefpodoxime Other (See Comments)    Yeast infections   . Codeine Other (See Comments)    Anxious/ jittery  . Lipitor [Atorvastatin] Other (See Comments)    Made joint start hurting/ Muscle aches  . Other Swelling    Venholin HFA  . Penicillins Other (See Comments)    Has patient had a PCN reaction causing immediate rash, facial/tongue/throat swelling, SOB or lightheadedness with hypotension: unknown, childhood reaction Has patient had a PCN reaction causing severe rash involving mucus membranes or skin necrosis: No Has patient had a PCN reaction  that required hospitalization No Has patient had a PCN reaction occurring within the last 10 years: No If all of the above answers are "NO", then may proceed with Cephalosporin use.   Terald Sleeper. Ventolin [Kdc:Albuterol] Other (See Comments)    "jittery"  . Anoro Ellipta [Umeclidinium-Vilanterol]     itchy  . Clindamycin Other (See Comments)    Unknown   . Hydrocodone-Acetaminophen Anxiety  . Latex Itching and Rash  . Levalbuterol Hcl Other (See Comments)    Unknown     Family History  Problem Relation Age of Onset  . Anemia Mother   . Emphysema Father   . Esophageal cancer Daughter      Prior to Admission medications   Medication Sig Start Date End Date Taking? Authorizing Provider  acetaminophen (TYLENOL) 500 MG tablet Take 1,000 mg by mouth 2 (two) times daily as needed for headache.    Yes Historical Provider, MD  calcium-vitamin D (OSCAL WITH D) 500-200 MG-UNIT per tablet Take 2 tablets by mouth daily with breakfast.   Yes Historical Provider, MD  IRON PO Take 1 tablet by mouth daily.   Yes Historical Provider, MD  isosorbide mononitrate (IMDUR) 60 MG 24 hr tablet Take 1.5 tablets (90 mg total) by mouth daily. Patient taking differently: Take 60 mg by mouth daily.  06/06/16  Yes Laurey Moralealton S McLean, MD  loratadine (CLARITIN) 10 MG tablet Take 10 mg by mouth at bedtime as needed for allergies.    Yes Historical Provider, MD  Menthol, Topical Analgesic, (MINERAL ICE EX) Apply 1 application topically as needed (for pain).   Yes Historical Provider, MD  metoprolol (LOPRESSOR) 50 MG tablet Take 1 tablet (50 mg total) by mouth 2 (two) times daily. 04/18/16  Yes Bevelyn Bucklesaniel R Bensimhon, MD  NITROSTAT 0.4 MG SL tablet place 1 tablet under the tongue if needed every 5 minutes for chest pain for 3 doses IF NO RELIEF AFTER 3RD DOSE CALL PRESCRIBER OR 911. 01/21/16  Yes Quintella Reichertraci R Turner, MD  omeprazole (PRILOSEC OTC) 20 MG tablet Take 20 mg by mouth at bedtime as needed (GERD).    Yes Historical Provider, MD    OXYGEN Inhale 2-3 L into the lungs continuous.    Yes Historical Provider, MD  potassium chloride SA (K-DUR,KLOR-CON) 20 MEQ tablet Take 2 tablets (40 mEq total) by mouth 2 (two) times daily. 01/15/16  Yes Laurey Morale, MD  rivaroxaban (XARELTO) 20 MG TABS tablet Take 1 tablet (20 mg total) by mouth daily with supper. 02/29/16  Yes Graciella Freer, PA-C  sertraline (ZOLOFT) 100 MG tablet Take 100 mg by mouth at bedtime.    Yes Historical Provider, MD  simvastatin (ZOCOR) 40 MG tablet TAKE 1 TABLET BY MOUTH  DAILY AT 6 PM Patient taking differently: TAKE 40 MG BY MOUTH  DAILY AT 6 PM 06/03/16  Yes Laurey Morale, MD  spironolactone (ALDACTONE) 25 MG tablet take 1 tablet by mouth once daily Patient taking differently: Take 25 mg by mouth once daily 04/28/16  Yes Graciella Freer, PA-C  torsemide (DEMADEX) 20 MG tablet Take 3 tablets (60 mg total) by mouth 2 (two) times daily. 05/05/16  Yes Laurey Morale, MD    Physical Exam: Vitals:   06/11/16 1400 06/11/16 1500 06/11/16 1535 06/11/16 1615  BP: 121/81 126/72  117/79  Pulse: 95 101  98  Resp: 16 18  21   Temp:   98.1 F (36.7 C)   SpO2: 100% 100%  100%  Weight:      Height:          Constitutional: NAD, calm, comfortable Vitals:   06/11/16 1400 06/11/16 1500 06/11/16 1535 06/11/16 1615  BP: 121/81 126/72  117/79  Pulse: 95 101  98  Resp: 16 18  21   Temp:   98.1 F (36.7 C)   SpO2: 100% 100%  100%  Weight:      Height:       Eyes: PERRL, lids and conjunctivae normal ENMT: Mucous membranes are moist. Posterior pharynx clear of any exudate or lesions. Dentures in place  Neck: normal, supple, no masses, no thyromegaly Respiratory: clear to auscultation bilaterally, no wheezing, no crackles. Normal respiratory effort. No accessory muscle use. Wearing 2L [place] Cardiovascular: irregularly irregular rate and rhythm, no murmurs / rubs / gallops. No extremity edema. 2+ pedal pulses. No carotid bruits.  Abdomen: no  tenderness, no masses palpated. No hepatosplenomegaly. Bowel sounds positive.  Musculoskeletal: no clubbing / cyanosis. No joint deformity upper and lower extremities. Good ROM, no contractures. Normal muscle tone.  Skin: no rashes, lesions, ulcers. No induration Neurologic: CN 2-12 grossly intact. Sensation intact, DTR normal. Strength 5/5 in all 4.  Psychiatric: Normal judgment and insight. Alert and oriented x 3. Normal mood.   Labs on Admission: I have personally reviewed following labs and imaging studies  CBC:  Recent Labs Lab 06/06/16 0956 06/11/16 1442  WBC 8.5 6.5  NEUTROABS  --  4.0  HGB 8.0* 8.5*  HCT 27.5* 28.9*  MCV 80.4 80.7  PLT 270 336   Basic Metabolic Panel:  Recent Labs Lab 06/06/16 0956 06/11/16 1442  NA 138 142  K 4.2 3.2*  CL 95* 95*  CO2 33* 36*  GLUCOSE 105* 89  BUN 18 12  CREATININE 1.28* 0.98  CALCIUM 9.2 9.5   GFR: Estimated Creatinine Clearance: 52.2 mL/min (by C-G formula based on SCr of 0.98 mg/dL). Liver Function Tests: No results for input(s): AST, ALT, ALKPHOS, BILITOT, PROT, ALBUMIN in the last 168 hours. No results for input(s): LIPASE, AMYLASE in the last 168 hours. No results for input(s): AMMONIA in the last 168 hours. Coagulation Profile: No results for input(s): INR, PROTIME in the last 168 hours. Cardiac Enzymes:  Recent Labs Lab 06/11/16 1442  TROPONINI 0.07*   BNP (last 3 results) No results for input(s): PROBNP in the last 8760 hours. HbA1C: No results for input(s): HGBA1C in the last 72 hours. CBG: No results for input(s): GLUCAP in the last 168 hours. Lipid Profile: No results for input(s): CHOL, HDL, LDLCALC, TRIG, CHOLHDL, LDLDIRECT in the last 72 hours. Thyroid Function Tests: No results for input(s): TSH, T4TOTAL, FREET4, T3FREE, THYROIDAB in the last 72 hours. Anemia Panel: No results for input(s): VITAMINB12, FOLATE, FERRITIN, TIBC, IRON, RETICCTPCT in the last 72 hours. Urine analysis:    Component  Value Date/Time   COLORURINE YELLOW 06/11/2016 1442   APPEARANCEUR CLEAR 06/11/2016 1442   LABSPEC 1.005 06/11/2016 1442   PHURINE 7.5 06/11/2016 1442   GLUCOSEU NEGATIVE 06/11/2016 1442   HGBUR NEGATIVE 06/11/2016 1442   BILIRUBINUR NEGATIVE 06/11/2016 1442   KETONESUR NEGATIVE 06/11/2016 1442   PROTEINUR NEGATIVE 06/11/2016 1442   NITRITE NEGATIVE 06/11/2016 1442   LEUKOCYTESUR NEGATIVE 06/11/2016 1442   Sepsis Labs: !!!!!!!!!!!!!!!!!!!!!!!!!!!!!!!!!!!!!!!!!!!! @LABRCNTIP (procalcitonin:4,lacticidven:4) )No results found for this or any previous visit (from the past 240 hour(s)).   Radiological Exams on Admission: Dg Chest 2 View  Result Date: 06/11/2016 CLINICAL DATA:  Short of breath for 1 month EXAM: CHEST  2 VIEW COMPARISON:  11/17/2015 FINDINGS: Heart is moderately enlarged. Mild vascular congestion. No interstitial edema. No consolidation or mass. No pneumothorax or pleural effusion. Stable L1 compression fracture. IMPRESSION: Cardiomegaly and mild vascular congestion. Electronically Signed   By: Jolaine Click M.D.   On: 06/11/2016 14:39    EKG: Independently reviewed. Atrial fibrillation with right bundle branch block  Assessment/Plan Active Problems:   Coronary atherosclerosis   COPD mixed type (HCC)   Chronic anticoagulation   Hypokalemia   Acute on chronic clinical systolic heart failure (HCC)  Acute on Chronic Systolic Heart Failure - Cardiology consulted and appreciate their recommendations - continue home medications - diuretics per cardiology - on baseline oxygen requirement - echo ordered  COPD  - patient cannot take any inhalers secondary to an allergic reaction that sounds like angioedema - will continue to offer supplemental oxygen - titrate as necessary - no wheezing on exam so steroids not necessary at this time  Chronic anticoagulation - will continue Xarelto  CAD - cycling troponins - slightly elevated troponin on admission of 0.07 -  cardiology consulted  Anemia - repeat CBC in am  - chronic, almost back to baseline    DVT prophylaxis: Heparin (patient on Xarelto)  Code Status: Full Code Family Communication: daughter and granddaugther bedside Disposition Plan: plan to discharge home when cardiac workup complete Consults called: Cardiology (C[place] called by EDP)  Admission status: inpatient- telemetry   Katrinka Blazing MD [facility] Pager [phone]  If 7PM-7AM, please contact night-coverage [email or web address] Password [facility]  06/11/2016, 5:43 PM

## 2016-06-11 NOTE — ED Notes (Signed)
Report attempted on 4E

## 2016-06-11 NOTE — ED Notes (Signed)
Called Margo @ pt  Placement, order has been changed to stepdown.  Coy Saunas will work on,

## 2016-06-12 ENCOUNTER — Encounter (HOSPITAL_COMMUNITY)
Admission: EM | Disposition: A | Discharge status: Home / Self Care | Payer: Self-pay | Source: Home / Self Care | Attending: Internal Medicine

## 2016-06-12 ENCOUNTER — Inpatient Hospital Stay (HOSPITAL_COMMUNITY): Payer: Medicare Other | Admitting: Anesthesiology

## 2016-06-12 ENCOUNTER — Encounter (HOSPITAL_COMMUNITY): Payer: Self-pay | Admitting: Internal Medicine

## 2016-06-12 DIAGNOSIS — I5033 Acute on chronic diastolic (congestive) heart failure: Secondary | ICD-10-CM

## 2016-06-12 DIAGNOSIS — J9622 Acute and chronic respiratory failure with hypercapnia: Secondary | ICD-10-CM

## 2016-06-12 DIAGNOSIS — E785 Hyperlipidemia, unspecified: Secondary | ICD-10-CM

## 2016-06-12 DIAGNOSIS — I4819 Other persistent atrial fibrillation: Secondary | ICD-10-CM | POA: Diagnosis present

## 2016-06-12 DIAGNOSIS — Z7901 Long term (current) use of anticoagulants: Secondary | ICD-10-CM

## 2016-06-12 DIAGNOSIS — D649 Anemia, unspecified: Secondary | ICD-10-CM | POA: Diagnosis present

## 2016-06-12 DIAGNOSIS — J9621 Acute and chronic respiratory failure with hypoxia: Secondary | ICD-10-CM

## 2016-06-12 DIAGNOSIS — I10 Essential (primary) hypertension: Secondary | ICD-10-CM

## 2016-06-12 DIAGNOSIS — I059 Rheumatic mitral valve disease, unspecified: Secondary | ICD-10-CM

## 2016-06-12 DIAGNOSIS — D508 Other iron deficiency anemias: Secondary | ICD-10-CM

## 2016-06-12 DIAGNOSIS — I4891 Unspecified atrial fibrillation: Secondary | ICD-10-CM

## 2016-06-12 HISTORY — PX: CARDIOVERSION: SHX1299

## 2016-06-12 LAB — BASIC METABOLIC PANEL
Anion gap: 9 (ref 5–15)
BUN: 13 mg/dL (ref 6–20)
CO2: 34 mmol/L — ABNORMAL HIGH (ref 22–32)
Calcium: 9.3 mg/dL (ref 8.9–10.3)
Chloride: 97 mmol/L — ABNORMAL LOW (ref 101–111)
Creatinine, Ser: 1.19 mg/dL — ABNORMAL HIGH (ref 0.44–1.00)
GFR, EST AFRICAN AMERICAN: 50 mL/min — AB (ref >60)
GFR, EST NON AFRICAN AMERICAN: 43 mL/min — AB (ref >60)
Glucose, Bld: 108 mg/dL — ABNORMAL HIGH (ref 65–99)
POTASSIUM: 4.1 mmol/L (ref 3.5–5.1)
SODIUM: 140 mmol/L (ref 135–145)

## 2016-06-12 LAB — MRSA PCR SCREENING: MRSA BY PCR: NEGATIVE

## 2016-06-12 LAB — TROPONIN I
TROPONIN I: 0.04 ng/mL — AB (ref <0.03)
Troponin I: 0.05 ng/mL (ref <0.03)

## 2016-06-12 SURGERY — CARDIOVERSION
Anesthesia: General

## 2016-06-12 MED ORDER — LIDOCAINE HCL (CARDIAC) 20 MG/ML IV SOLN
INTRAVENOUS | Status: DC | PRN
  Administered 2016-06-12: 60 mg via INTRAVENOUS

## 2016-06-12 MED ORDER — WHITE PETROLATUM GEL
Status: AC
  Administered 2016-06-12: 1
  Filled 2016-06-12: qty 1

## 2016-06-12 MED ORDER — FERROUS SULFATE 325 (65 FE) MG PO TABS
325.0000 mg | ORAL_TABLET | Freq: Two times a day (BID) | ORAL | Status: DC
  Administered 2016-06-12 – 2016-06-18 (×13): 325 mg via ORAL
  Filled 2016-06-12 (×13): qty 1

## 2016-06-12 MED ORDER — PHENYLEPHRINE HCL 10 MG/ML IJ SOLN
INTRAMUSCULAR | Status: DC | PRN
  Administered 2016-06-12: 80 ug via INTRAVENOUS

## 2016-06-12 MED ORDER — PROPOFOL 10 MG/ML IV BOLUS
INTRAVENOUS | Status: DC | PRN
  Administered 2016-06-12: 60 mg via INTRAVENOUS

## 2016-06-12 NOTE — Discharge Instructions (Addendum)
Heart Failure  Heart failure means your heart has trouble pumping blood. This makes it hard for your body to work well. Heart failure is usually a long-term (chronic) condition. You must take good care of yourself and follow your doctor's treatment plan. HOME CARE  Take your heart medicine as told by your doctor.  Do not stop taking medicine unless your doctor tells you to.  Do not skip any dose of medicine.  Refill your medicines before they run out.  Take other medicines only as told by your doctor or pharmacist.  Stay active if told by your doctor. The elderly and people with severe heart failure should talk with a doctor about physical activity.  Eat heart-healthy foods. Choose foods that are without trans fat and are low in saturated fat, cholesterol, and salt (sodium). This includes fresh or frozen fruits and vegetables, fish, lean meats, fat-free or low-fat dairy foods, whole grains, and high-fiber foods. Lentils and dried peas and beans (legumes) are also good choices.  Limit salt if told by your doctor.  Cook in a healthy way. Roast, grill, broil, bake, poach, steam, or stir-fry foods.  Limit fluids as told by your doctor.  Weigh yourself every morning. Do this after you pee (urinate) and before you eat breakfast. Write down your weight to give to your doctor.  Take your blood pressure and write it down if your doctor tells you to.  Ask your doctor how to check your pulse. Check your pulse as told.  Lose weight if told by your doctor.  Stop smoking or chewing tobacco. Do not use gum or patches that help you quit without your doctor's approval.  Schedule and go to doctor visits as told.  Nonpregnant women should have no more than 1 drink a day. Men should have no more than 2 drinks a day. Talk to your doctor about drinking alcohol.  Stop illegal drug use.  Stay current with shots (immunizations).  Manage your health conditions as told by your doctor.  Learn to  manage your stress.  Rest when you are tired.  If it is really hot outside:  Avoid intense activities.  Use air conditioning or fans, or get in a cooler place.  Avoid caffeine and alcohol.  Wear loose-fitting, lightweight, and light-colored clothing.  If it is really cold outside:  Avoid intense activities.  Layer your clothing.  Wear mittens or gloves, a hat, and a scarf when going outside.  Avoid alcohol.  Learn about heart failure and get support as needed.  Get help to maintain or improve your quality of life and your ability to care for yourself as needed. GET HELP IF:   You gain weight quickly.  You are more short of breath than usual.  You cannot do your normal activities.  You tire easily.  You cough more than normal, especially with activity.  You have any or more puffiness (swelling) in areas such as your hands, feet, ankles, or belly (abdomen).  You cannot sleep because it is hard to breathe.  You feel like your heart is beating fast (palpitations).  You get dizzy or light-headed when you stand up. GET HELP RIGHT AWAY IF:   You have trouble breathing.  There is a change in mental status, such as becoming less alert or not being able to focus.  You have chest pain or discomfort.  You faint. MAKE SURE YOU:   Understand these instructions.  Will watch your condition.  Will get help right away if  you are not doing well or get worse. This information is not intended to replace advice given to you by your health care provider. Make sure you discuss any questions you have with your health care provider. Document Released: 04/19/2008 Document Revised: 08/01/2014 Document Reviewed: 08/27/2012 Elsevier Interactive Patient Education  2017 [employer]. Information on my medicine - XARELTO (Rivaroxaban)  This medication education was reviewed with me or my healthcare representative as part of my discharge preparation.   Why was Xarelto prescribed  for you? Xarelto was prescribed for you to reduce the risk of a blood clot forming that can cause a stroke if you have a medical condition called atrial fibrillation (a type of irregular heartbeat).  What do you need to know about xarelto ? Take your Xarelto ONCE DAILY at the same time every day with your evening meal. If you have difficulty swallowing the tablet whole, you may crush it and mix in applesauce just prior to taking your dose.  Take Xarelto exactly as prescribed by your doctor and DO NOT stop taking Xarelto without talking to the doctor who prescribed the medication.  Stopping without other stroke prevention medication to take the place of Xarelto may increase your risk of developing a clot that causes a stroke.  Refill your prescription before you run out.  After discharge, you should have regular check-up appointments with your healthcare provider that is prescribing your Xarelto.  In the future your dose may need to be changed if your kidney function or weight changes by a significant amount.  What do you do if you miss a dose? If you are taking Xarelto ONCE DAILY and you miss a dose, take it as soon as you remember on the same day then continue your regularly scheduled once daily regimen the next day. Do not take two doses of Xarelto at the same time or on the same day.   Important Safety Information A possible side effect of Xarelto is bleeding. You should call your healthcare provider right away if you experience any of the following: ? Bleeding from an injury or your nose that does not stop. ? Unusual colored urine (red or dark brown) or unusual colored stools (red or black). ? Unusual bruising for unknown reasons. ? A serious fall or if you hit your head (even if there is no bleeding).  Some medicines may interact with Xarelto and might increase your risk of bleeding while on Xarelto. To help avoid this, consult your healthcare provider or pharmacist prior to  using any new prescription or non-prescription medications, including herbals, vitamins, non-steroidal anti-inflammatory drugs (NSAIDs) and supplements.  This website has more information on Xarelto: VisitDestination.com.br.

## 2016-06-12 NOTE — Anesthesia Procedure Notes (Signed)
Date/Time: 06/12/2016 8:45 AM Performed by: Gwenyth Allegra Pre-anesthesia Checklist: Patient being monitored, Suction available, Emergency Drugs available and Timeout performed Patient Re-evaluated:Patient Re-evaluated prior to inductionOxygen Delivery Method: Ambu bag Preoxygenation: Pre-oxygenation with 100% oxygen Intubation Type: IV induction

## 2016-06-12 NOTE — Progress Notes (Signed)
PT Cancellation Note  Patient Details Name: [name] MRN: [id number] DOB: 18-Jan-1941   Cancelled Treatment:    Reason Eval/Treat Not Completed: Patient at procedure or test/unavailable (pt in OR and not available at this time)   [name] [name] 06/12/2016, 8:36 AM [name], PT [phone]

## 2016-06-12 NOTE — Anesthesia Postprocedure Evaluation (Signed)
Anesthesia Post Note  Patient: [name]  Procedure(Lloyd) Performed: Procedure(Lloyd) (LRB): CARDIOVERSION (N/A)  Patient location during evaluation: PACU Anesthesia Type: General Level of consciousness: awake and alert Pain management: pain level controlled Vital Signs Assessment: post-procedure vital signs reviewed and stable Respiratory status: spontaneous breathing, nonlabored ventilation, respiratory function stable and patient connected to nasal cannula oxygen Cardiovascular status: blood pressure returned to baseline and stable Postop Assessment: no signs of nausea or vomiting Anesthetic complications: no    Last Vitals:  Vitals:   06/12/16 0912 06/12/16 0918  BP: (!) 122/50 (!) 103/53  Pulse: 61 64  Resp: (!) 22 (!) 22  Temp:      Last Pain:  Vitals:   06/12/16 0701  TempSrc: Oral  PainSc:                  [name]

## 2016-06-12 NOTE — Interval H&P Note (Signed)
History and Physical Interval Note:  06/12/2016 8:54 AM  [name]  has presented today for surgery, with the diagnosis of afib  The various methods of treatment have been discussed with the patient and family. After consideration of risks, benefits and other options for treatment, the patient has consented to  Procedure(s): CARDIOVERSION (N/A) as a surgical intervention .  The patient's history has been reviewed, patient examined, no change in status, stable for surgery.  I have reviewed the patient's chart and labs.  Questions were answered to the patient's satisfaction.     Sherryl Manges  K normalized

## 2016-06-12 NOTE — H&P (View-Only) (Signed)
CARDIOLOGY CONSULT NOTE  Patient ID: [name], MRN: [id number], DOB/AGE: 09-20-1940 75 y.o. Admit date: 06/11/2016 Date of Consult: 06/11/2016  Primary Physician: Astrid DivineGRIFFIN,[name], MD Primary Cardiologist: [facility] Consulting Physician [facility]  Chief Complaint: SOB and chest pain    HPI [name] is a 75 y.o. female admitted with worsening SOB She had been seen 5 days ago and labs notable For a chronically low but lower hemoglobin now at the 8.0 range. More importantly, her ECG demonstrated sinus rhythm with ectopy.    She has a complicated past cardiac history. She has a history of coronary artery disease with prior bypass. Catheterization 7/17 demonstrated patent bypass grafts but a 90% lesion in the distal circumflex that was small and not felt amenable to PCI. 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  She also has pulmonary hypertension. PA pressure 6/16 68. 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 This was most recently assessed 3/17 transthoracic echo 2/17 mean mitral gradient 6 severe LAE greater than RAE. LV function was normal.  Right heart cath 3/17 PA pressure 71 with PVR 2.7  echo also demonstrated an aortic valve vegetation concerning for endocarditis. ID was consulted and felt to be contaminant.       Past Medical History:  Diagnosis Date  . Anxiety   . Bursitis, shoulder    bilateral  . CHF (congestive heart failure) (HCC)    chronic diasotlic CHF  . Complication of anesthesia    "have trouble getting me awake sometimes; been ok latelhy" (09/24/2015)  . COPD (chronic obstructive pulmonary disease) (HCC)   . Coronary heart disease 2001   s/p CABG  . Depression   . DJD (degenerative joint disease)   . GERD (gastroesophageal reflux disease)   . H/O hiatal hernia   . History of rheumatic heart disease    X 3-LAST TIME WHEN PATIENT WAS 75 YRS OLD  . Hyperlipidemia   . Hypertension   . OA (osteoarthritis)    . On home oxygen therapy    "2L; 24/7" (09/24/2015)  . PUD (peptic ulcer disease)   . Pulmonary HTN    PASP 50-4155mmHg by [id number]/[id number]/2014  . Pulmonary HTN   . Sleep apnea    intolerant to CPAP  . Thyroid nodule   . Urinary incontinence   . Varicose veins       Surgical History:  Past Surgical History:  Procedure Laterality Date  . BILATERAL SALPINGOOPHORECTOMY    . CARDIAC CATHETERIZATION  02/11/2008, 01/30/2004, 06/14/2000  . CARDIAC CATHETERIZATION N/A 09/24/2015   Procedure: Right Heart Cath;  Surgeon: Laurey Moralealton S McLean, MD;  [facility];  Service: Cardiovascular;  Laterality: N/A;  . CARDIAC CATHETERIZATION N/A 02/08/2016   Procedure: Right/Left Heart Cath and Coronary/Graft Angiography;  Surgeon: Laurey Moralealton S McLean, MD;  [facility];  Service: Cardiovascular;  Laterality: N/A;  . CORONARY ARTERY BYPASS GRAFT  06/21/2000   x4  . INCONTINENCE SURGERY     "tacked"  . TEE WITHOUT CARDIOVERSION N/A 09/24/2015   Procedure: TRANSESOPHAGEAL ECHOCARDIOGRAM (TEE);  Surgeon: Laurey Moralealton S McLean, MD;  [facility];  Service: Cardiovascular;  Laterality: N/A;  . THYROIDECTOMY, PARTIAL  2010   Dr Michaell CowingGross  . TOTAL ABDOMINAL HYSTERECTOMY  1990   1990  . TUBAL LIGATION    . VAGINAL DELIVERY  x4     Home Meds: Prior to Admission medications   Medication Sig Start Date End  Date Taking? Authorizing Provider  acetaminophen (TYLENOL) 500 MG tablet Take 1,000 mg by mouth 2 (two) times daily as needed for headache.    Yes Historical Provider, MD  calcium-vitamin D (OSCAL WITH D) 500-200 MG-UNIT per tablet Take 2 tablets by mouth daily with breakfast.   Yes Historical Provider, MD  IRON PO Take 1 tablet by mouth daily.   Yes Historical Provider, MD  isosorbide mononitrate (IMDUR) 60 MG 24 hr tablet Take 1.5 tablets (90 mg total) by mouth daily. Patient taking differently: Take 60 mg by mouth daily.  06/06/16  Yes Laurey Morale, MD  loratadine (CLARITIN) 10 MG tablet Take  10 mg by mouth at bedtime as needed for allergies.    Yes Historical Provider, MD  Menthol, Topical Analgesic, (MINERAL ICE EX) Apply 1 application topically as needed (for pain).   Yes Historical Provider, MD  metoprolol (LOPRESSOR) 50 MG tablet Take 1 tablet (50 mg total) by mouth 2 (two) times daily. 04/18/16  Yes Bevelyn Buckles Bensimhon, MD  NITROSTAT 0.4 MG SL tablet place 1 tablet under the tongue if needed every 5 minutes for chest pain for 3 doses IF NO RELIEF AFTER 3RD DOSE CALL PRESCRIBER OR 911. 01/21/16  Yes Quintella Reichert, MD  omeprazole (PRILOSEC OTC) 20 MG tablet Take 20 mg by mouth at bedtime as needed (GERD).    Yes Historical Provider, MD  OXYGEN Inhale 2-3 L into the lungs continuous.    Yes Historical Provider, MD  potassium chloride SA (K-DUR,KLOR-CON) 20 MEQ tablet Take 2 tablets (40 mEq total) by mouth 2 (two) times daily. 01/15/16  Yes Laurey Morale, MD  rivaroxaban (XARELTO) 20 MG TABS tablet Take 1 tablet (20 mg total) by mouth daily with supper. 02/29/16  Yes Graciella Freer, PA-C  sertraline (ZOLOFT) 100 MG tablet Take 100 mg by mouth at bedtime.    Yes Historical Provider, MD  simvastatin (ZOCOR) 40 MG tablet TAKE 1 TABLET BY MOUTH  DAILY AT 6 PM Patient taking differently: TAKE 40 MG BY MOUTH  DAILY AT 6 PM 06/03/16  Yes Laurey Morale, MD  spironolactone (ALDACTONE) 25 MG tablet take 1 tablet by mouth once daily Patient taking differently: Take 25 mg by mouth once daily 04/28/16  Yes Graciella Freer, PA-C  torsemide (DEMADEX) 20 MG tablet Take 3 tablets (60 mg total) by mouth 2 (two) times daily. 05/05/16  Yes Laurey Morale, MD    Inpatient Medications:   Allergies:  Allergies  Allergen Reactions  . Prednisone Rash    jittery  . Tape Other (See Comments)    Bleeding  . Xopenex [Levalbuterol] Other (See Comments)    Made mouth and throat sore  . Cefpodoxime Other (See Comments)    Yeast infections   . Codeine Other (See Comments)    Anxious/ jittery   . Lipitor [Atorvastatin] Other (See Comments)    Made joint start hurting/ Muscle aches  . Other Swelling    Venholin HFA  . Penicillins Other (See Comments)    Has patient had a PCN reaction causing immediate rash, facial/tongue/throat swelling, SOB or lightheadedness with hypotension: unknown, childhood reaction Has patient had a PCN reaction causing severe rash involving mucus membranes or skin necrosis: No Has patient had a PCN reaction that required hospitalization No Has patient had a PCN reaction occurring within the last 10 years: No If all of the above answers are "NO", then may proceed with Cephalosporin use.   Terald Sleeper [Kdc:Albuterol] Other (  See Comments)    "jittery"  . Anoro Ellipta [Umeclidinium-Vilanterol]     itchy  . Clindamycin Other (See Comments)    Unknown   . Hydrocodone-Acetaminophen Anxiety  . Latex Itching and Rash  . Levalbuterol Hcl Other (See Comments)    Unknown     Social History   Social History  . Marital status: Married    Spouse name: N/A  . Number of children: N/A  . Years of education: N/A   Occupational History  . Not on file.   Social History Main Topics  . Smoking status: Former Smoker    Packs/day: 1.00    Years: 47.00    Types: Cigarettes    Quit date: 07/26/2007  . Smokeless tobacco: Never Used  . Alcohol use No  . Drug use: No  . Sexual activity: No   Other Topics Concern  . Not on file   Social History Narrative   Husband bilateral left amputee for atheorsclerotic disease     Family History  Problem Relation Age of Onset  . Anemia Mother   . Emphysema Father   . Esophageal cancer Daughter      ROS:  Please see the history of present illness.     All other systems reviewed and negative.    Physical Exam:  Blood pressure 117/79, pulse 98, temperature 98.1 F (36.7 C), resp. rate 21, height 5\' 6"  (1.676 m), weight 177 lb 3.2 oz (80.4 kg), SpO2 100 %. General: Well developed, well nourished female in no acute  distress. Head: Normocephalic, atraumatic, sclera non-icteric, no xanthomas, nares are without discharge. EENT: normal  Lymph Nodes:  none Neck: Negative for carotid bruits. JVD >10 Back:without scoliosis kyphosis  Lungs: Cracklers Heart irregularly irregular rapid rate and rhythm with a 3  /6 systolic murmur the magnitude of which changes with RR intervals. No rubs, or gallops appreciated. Abdomen: Soft, non-tender, non-distended with normoactive bowel sounds. No hepatomegaly. No rebound/guarding. No obvious abdominal masses. Msk:  Strength and tone appear normal for age. Extremities: No clubbing or cyanosis.  1+  edema.  Distal pedal pulses are 2+ and equal bilaterally. Skin: Warm and Dry Neuro: Alert and oriented X 3. CN III-XII intact Grossly normal sensory and motor function . Psych:  Responds to questions appropriately with a normal affect.      Labs: Cardiac Enzymes  Recent Labs  06/11/16 1442  TROPONINI 0.07*   CBC Lab Results  Component Value Date   WBC 6.5 06/11/2016   HGB 8.5 (L) 06/11/2016   HCT 28.9 (L) 06/11/2016   MCV 80.7 06/11/2016   PLT 336 06/11/2016   PROTIME: No results for input(s): LABPROT, INR in the last 72 hours. Chemistry  Recent Labs Lab 06/11/16 1442  NA 142  K 3.2*  CL 95*  CO2 36*  BUN 12  CREATININE 0.98  CALCIUM 9.5  GLUCOSE 89   Lipids Lab Results  Component Value Date   CHOL 168 05/05/2016   HDL 71 05/05/2016   LDLCALC 78 05/05/2016   TRIG 93 05/05/2016   BNP Pro B Natriuretic peptide (BNP)  Date/Time Value Ref Range Status  01/09/2015 09:07 AM 245.0 (H) 0.0 - 100.0 pg/mL Final  11/04/2011 07:45 AM 449.4 (H) 0 - 125 pg/mL Final  11/02/2011 12:45 PM 609.1 (H) 0 - 125 pg/mL Final   Thyroid Function Tests: No results for input(s): TSH, T4TOTAL, T3FREE, THYROIDAB in the last 72 hours.  Invalid input(s): FREET3 Miscellaneous Lab Results  Component Value Date  DDIMER 0.36 02/25/2013    Radiology/Studies:  Dg  Chest 2 View  Result Date: 06/11/2016 CLINICAL DATA:  Short of breath for 1 month EXAM: CHEST  2 VIEW COMPARISON:  11/17/2015 FINDINGS: Heart is moderately enlarged. Mild vascular congestion. No interstitial edema. No consolidation or mass. No pneumothorax or pleural effusion. Stable L1 compression fracture. IMPRESSION: Cardiomegaly and mild vascular congestion. Electronically Signed   By: Jolaine Click M.D.   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 This unfortunately is not a surprise with massive left atrial enlargement and the potential success of cardioversion is concerning as in the context of mitral stenosis atrial fibrillation has a large hemodynamic impact.   It may be that the hypokalemia has made her electrically less stable and contributed to her atrial fibrillation. Repletion will be important.  It may well be, because as best as I can tell that atrial fibrillation has not required cardioversion in some time, that if she reverts to atrial fibrillation in the short-term that amiodarone not withstanding her lung disease would be appropriate.  I have arranged for cardioversion tomorrow morning at 8:00.   Blood pressure is low today, this is likely a consequence of the hemodynamics. I will use intravenous amiodarone in the short-term to try to slow her heart rate down which will improve this. Beta blockers and calcium blockers unfortunately are more likely to aggravate her hypotension  I wonder also whether her anemia needs further evaluation. I cannot find an  up-to-date assessment. Ferritin levels 3/17 were borderline low at 16.  Sherryl Manges

## 2016-06-12 NOTE — Progress Notes (Signed)
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 She was admitted 11/18 with heart failure and was found to be in new onset atrial fibrillation   amiodarone was initiated for augmented rate control. She also significant left atrial enlargement.  She underwent cardioversion this morning.  .     Past Medical History:  Diagnosis Date  . Acute parotitis 01/05/2016  . Anxiety   . Bacteremia, escherichia coli   . Bursitis, shoulder    bilateral  . CHF (congestive heart failure) (HCC)    chronic diasotlic CHF  . Complication of anesthesia    "have trouble getting me awake sometimes; been ok latelhy" (09/24/2015)  . COPD (chronic obstructive pulmonary disease) (HCC)   . Coronary heart disease 2001   s/p CABG  . Depression   . DJD (degenerative joint disease)   . GERD (gastroesophageal reflux disease)   . H/O hiatal hernia   . History of rheumatic heart disease    X 3-LAST TIME WHEN PATIENT WAS 75 YRS OLD  . Hyperlipidemia   . Hypertension   . OA (osteoarthritis)   . On home oxygen therapy    "2L; 24/7" (09/24/2015)  . PUD (peptic ulcer disease)   . Pulmonary HTN    PASP 50-855mmHg by [id number]/1191echo8/2014  . Pulmonary HTN   . Sleep apnea    intolerant to CPAP  . Thyroid nodule   . Urinary incontinence   . Varicose veins     Scheduled Meds:  Scheduled Meds: . ferrous sulfate  325 mg Oral BID WC  . isosorbide mononitrate  60 mg Oral Daily  . lisinopril  2.5 mg Oral Daily  . metoprolol  50 mg Oral BID  . rivaroxaban  20 mg Oral Q supper  . simvastatin  60 mg Oral q1800  . spironolactone  25 mg Oral Daily   Continuous Infusions: . amiodarone 30 mg/hr (06/12/16 0240)       PHYSICAL EXAM Vitals:   06/12/16 0908 06/12/16 0912 06/12/16 0918 06/12/16 0932  BP: (!)  103/39 (!) 122/50 (!) 103/53 (!) 111/50  Pulse: 60 61 64 62  Resp: (!) 23 (!) 22 (!) 22 14  Temp:      TempSrc:      SpO2: 100% 100% 98% 100%  Weight:      Height:        Well developed and nourished in mod respiratory distress HENT normal Neck supple with JVP>10 Carotids brisk and full without bruits Clear Irregularly irregular rate and rhythm with rapid ventricular response, no murmurs or gallops Abd-soft with active BS without hepatomegaly No Clubbing cyanosis 1+ edema Skin-warm and dry A & Oriented  Grossly normal sensory and motor function   TELEMETRY: Reviewed personnally pt in  Atrial fbi>> sinus:  ECG personally reviewed atrial fib this am   Intake/Output Summary (Last 24 hours) at 06/12/16 1123 Last data filed at 06/12/16 0600  Gross per 24 hour  Intake           244.78 ml  Output              600 ml  Net          -355.22 ml    LABS: Basic Metabolic Panel:  Recent Labs Lab 06/06/16 0956 06/11/16 1442 06/11/16 2157 06/12/16 0415  NA 138 142  --  140  K 4.2 3.2*  --  4.1  CL 95* 95*  --  97*  CO2 33* 36*  --  34*  GLUCOSE 105* 89  --  108*  BUN 18 12  --  13  CREATININE 1.28* 0.98  --  1.19*  CALCIUM 9.2 9.5  --  9.3  MG  --   --  2.1  --    Cardiac Enzymes:  Recent Labs  06/11/16 2157 06/12/16 0415 06/12/16 0947  TROPONINI 0.06* 0.05* 0.04*   CBC:  Recent Labs Lab 06/06/16 0956 06/11/16 1442  WBC 8.5 6.5  NEUTROABS  --  4.0  HGB 8.0* 8.5*  HCT 27.5* 28.9*  MCV 80.4 80.7  PLT 270 336   PROTIME: No results for input(s): LABPROT, INR in the last 72 hours. Liver Function Tests: No results for input(s): AST, ALT, ALKPHOS, BILITOT, PROT, ALBUMIN in the last 72 hours. No results for input(s): LIPASE, AMYLASE in the last 72 hours. BNP: BNP (last 3 results)  Recent Labs  05/05/16 0958 06/06/16 0957 06/11/16 1443  BNP 319.4* 443.7* 856.4*    ProBNP (last 3 results) No results for input(s): PROBNP in the last 8760  hours.  D-Dimer: No results for input(s): DDIMER in the last 72 hours. Hemoglobin A1C: No results for input(s): HGBA1C in the last 72 hours. Fasting Lipid Panel: No results for input(s): CHOL, HDL, LDLCALC, TRIG, CHOLHDL, LDLDIRECT in the last 72 hours. Thyroid Function Tests: No results for input(s): TSH, T4TOTAL, T3FREE, THYROIDAB in the last 72 hours.  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 We'll keep her on amiodarone for right now given her left atrial size is long-term use to Dr. DM; I'm sure they will be reservations given her chronic lung disease. Continue low-dose diuresis and I hope with Dr. Precious Bard that she will be ready to go home tomorrow.    anemia guidelines do not support the urgent need for transfusion--that I can find any way.  She has been started on iron therapy which is clearly indicated.  Signed, Sherryl Manges MD  06/12/2016

## 2016-06-12 NOTE — Transfer of Care (Signed)
Immediate Anesthesia Transfer of Care Note  Patient: [name]  Procedure(s) Performed: Procedure(s): CARDIOVERSION (N/A)  Patient Location: PACU  Anesthesia Type:MAC  Level of Consciousness: awake, alert  and oriented  Airway & Oxygen Therapy: Patient Spontanous Breathing and Patient connected to nasal cannula oxygen  Post-op Assessment: Report given to RN and Post -op Vital signs reviewed and stable  Post vital signs: Reviewed and stable  Last Vitals:  Vitals:   06/12/16 0843 06/12/16 0852  BP: 115/68 112/71  Pulse:    Resp:    Temp:      Last Pain:  Vitals:   06/12/16 0701  TempSrc: Oral  PainSc:          Complications: No apparent anesthesia complications

## 2016-06-12 NOTE — Anesthesia Preprocedure Evaluation (Signed)
Anesthesia Evaluation  Patient identified by MRN, date of birth, ID band Patient awake    Reviewed: Allergy & Precautions, NPO status , Patient's Chart, lab work & pertinent test results  Airway Mallampati: II  TM Distance: >3 FB Neck ROM: Full    Dental no notable dental hx.    Pulmonary sleep apnea , COPD, former smoker,    Pulmonary exam normal breath sounds clear to auscultation       Cardiovascular hypertension, + CAD, + CABG and +CHF  negative cardio ROS   Rhythm:Irregular Rate:Normal     Neuro/Psych negative neurological ROS  negative psych ROS   GI/Hepatic Neg liver ROS, GERD  ,  Endo/Other  negative endocrine ROS  Renal/GU negative Renal ROS  negative genitourinary   Musculoskeletal negative musculoskeletal ROS (+)   Abdominal   Peds negative pediatric ROS (+)  Hematology negative hematology ROS (+)   Anesthesia Other Findings   Reproductive/Obstetrics negative OB ROS                             Anesthesia Physical Anesthesia Plan  ASA: III  Anesthesia Plan: General   Post-op Pain Management:    Induction: Intravenous  Airway Management Planned: Mask  Additional Equipment:   Intra-op Plan:   Post-operative Plan: Extubation in OR  Informed Consent: I have reviewed the patients History and Physical, chart, labs and discussed the procedure including the risks, benefits and alternatives for the proposed anesthesia with the patient or authorized representative who has indicated his/her understanding and acceptance.   Dental advisory given  Plan Discussed with: CRNA and Surgeon  Anesthesia Plan Comments:         Anesthesia Quick Evaluation

## 2016-06-12 NOTE — CV Procedure (Signed)
Preop Dx  afib  Post op DX  NSR   Procedure  DC Cardioversion   Pt was sedated by anesthesia receiving 60 mg Propafol  A synchronized shock 120 joules restored sinus Rhythm   Pt tolerated without difficulty except low BP treated with phenyephrine

## 2016-06-12 NOTE — Progress Notes (Addendum)
Progress Note    [name]  BOF:[id number] DOB: June 07, 1941  DOA: 06/11/2016 PCP: Astrid Divine, MD    Brief Narrative:   Chief complaint: F/U atrial fibrillation  [name] is an 75 y.o. female with a PMH of severe COPD, CAD status post CABG, GERD, history of rheumatic heart disease, and pulmonary hypertension who was admitted 06/11/16 for evaluation of worsening shortness of breath and chest pain. Patient did have a heart catheterization 7/17 which showed patent bypass grafts and a 90% stenosis in the distal left circumflex that was not amenable to PCI. EF 60% by echocardiogram 3/17. This study also showed a vegetation on the aortic valve, rheumatic mitral stenosis, and a small PFO. PFTs done 2/17 showed moderate-severe obstructive airway disease. A sleep study was negative for OSA. Upon initial evaluation in the ED, patient's BNP was elevated and she was found to be in atrial fibrillation. Cardiology was consulted.  Assessment/Plan:   Principal Problem:   Acute on chronic diastolic heart failure (HCC)/Atrial fibrillation with RVR (HCC)/paroxysmal atrial fibrillation/Chronic anticoagulation Patient's diastolic heart failure likely secondary to atrial fibrillation with RVR. Seen by cardiologist, s/p cardioversion today. Continue amiodarone. On Xarelto chronically. F/U 2 D Echo.  Active Problems:   Normocytic anemia Ferritin low, consistent with iron deficiency. Heme check stools. Start iron supplement. If stools heme +, will need to consider GI evaluation but on chronic anti-coagulation, so this will need to be coordinated in the outpatient setting once it is safe to hold blood thinners.    Coronary atherosclerosis/demand ischemia Status post CABG. Catheterization 7/17 showed patent bypass grafts with a 90% stenosis in the distal left circumflex not amenable to PCI. Continue nitrates. Mild troponin elevation likely from demand ischemia in the setting of atrial  fibrillation.    Pulmonary hypertension (HCC) PA pressure 6/16 68.    COPD mixed type (HCC)/acute on chronic respiratory failure with hypoxia and hypercapnia Does not appear to be on chronic therapy for this other than oxygen.    HTN (hypertension) Continue metoprolol, spironolactone and lisinopril. Demadex on hold.    Dyslipidemia, goal LDL below 70 Continue Zocor.    Hypokalemia Repleted.    Acute kidney injury (HCC) Creatinine bump of 0.3 noted. Demadex on hold.    Rheumatic mitral valve disease/Mitral stenosis Cardiology on board. F/U 2 D Echo.   Family Communication/Anticipated D/C date and plan/Code Status   DVT prophylaxis: Xarelto. Code Status: Full Code.  Family Communication: Daughter at the bedside. Disposition Plan: Home in 24 hours if respiratory status improved.   Medical Consultants:    Cardiology   Procedures:    Cardioversion 06/12/16  Anti-Infectives:    None  Subjective:   The patient reports that she still feels a bit short of breath. Review of symptoms is positive for hunger and negative for chest pain, nausea/vomiting.  Objective:    Vitals:   06/11/16 2340 06/12/16 0313 06/12/16 0537 06/12/16 0701  BP: 101/79 117/61 (!) 96/56   Pulse: 85 97 88   Resp: (!) 23 (!) 26 (!) 26   Temp:  97.8 F (36.6 C)  97.7 F (36.5 C)  TempSrc:  Oral  Oral  SpO2: 100% 100% 100%   Weight:      Height:        Intake/Output Summary (Last 24 hours) at 06/12/16 0829 Last data filed at 06/12/16 0600  Gross per 24 hour  Intake           244.78 ml  Output  600 ml  Net          -355.22 ml   Filed Weights   06/11/16 1255 06/11/16 2140  Weight: 80.4 kg (177 lb 3.2 oz) 80.4 kg (177 lb 3.2 oz)    Exam: General exam: Appears calm and comfortable.  Respiratory system: Clear to auscultation, diminished. Respiratory effort increased/breathless with activity, has trouble completing sentences. Cardiovascular system: S1 & S2 heard, RRR.  No JVD,  rubs, gallops or clicks. II/VI systolic ejection murmur. Gastrointestinal system: Abdomen is nondistended, soft and nontender. No organomegaly or masses felt. Normal bowel sounds heard. Central nervous system: Alert and oriented. No focal neurological deficits. Extremities: No clubbing,  or cyanosis. No edema. Skin: No rashes, lesions or ulcers. Psychiatry: Judgement and insight appear normal. Mood & affect appropriate.   Data Reviewed:   I have personally reviewed following labs and imaging studies:  Labs: Basic Metabolic Panel:  Recent Labs Lab 06/06/16 0956 06/11/16 1442 06/11/16 2157 06/12/16 0415  NA 138 142  --  140  K 4.2 3.2*  --  4.1  CL 95* 95*  --  97*  CO2 33* 36*  --  34*  GLUCOSE 105* 89  --  108*  BUN 18 12  --  13  CREATININE 1.28* 0.98  --  1.19*  CALCIUM 9.2 9.5  --  9.3  MG  --   --  2.1  --    GFR Estimated Creatinine Clearance: 43 mL/min (by C-G formula based on SCr of 1.19 mg/dL (H)).  CBC:  Recent Labs Lab 06/06/16 0956 06/11/16 1442  WBC 8.5 6.5  NEUTROABS  --  4.0  HGB 8.0* 8.5*  HCT 27.5* 28.9*  MCV 80.4 80.7  PLT 270 336   Cardiac Enzymes:  Recent Labs Lab 06/11/16 1442 06/11/16 2157 06/12/16 0415  TROPONINI 0.07* 0.06* 0.05*   Anemia work up:  Recent Labs  06/11/16 1942  FERRITIN 9*   Microbiology Recent Results (from the past 240 hour(s))  MRSA PCR Screening     Status: None   Collection Time: 06/11/16  9:33 PM  Result Value Ref Range Status   MRSA by PCR NEGATIVE NEGATIVE Final    Comment:        The GeneXpert MRSA Assay (FDA approved for NASAL specimens only), is one component of a comprehensive MRSA colonization surveillance program. It is not intended to diagnose MRSA infection nor to guide or monitor treatment for MRSA infections.     Radiology: Dg Chest 2 View  Result Date: 06/11/2016 CLINICAL DATA:  Short of breath for 1 month EXAM: CHEST  2 VIEW COMPARISON:  11/17/2015 FINDINGS: Heart is  moderately enlarged. Mild vascular congestion. No interstitial edema. No consolidation or mass. No pneumothorax or pleural effusion. Stable L1 compression fracture. IMPRESSION: Cardiomegaly and mild vascular congestion. Electronically Signed   By: Jolaine ClickArthur  Hoss M.D.   On: 06/11/2016 14:39    Medications:   . [MAR Hold] isosorbide mononitrate  60 mg Oral Daily  . [MAR Hold] lisinopril  2.5 mg Oral Daily  . [MAR Hold] metoprolol  50 mg Oral BID  . [MAR Hold] potassium chloride SA  40 mEq Oral BID  . [MAR Hold] rivaroxaban  20 mg Oral Q supper  . [MAR Hold] simvastatin  60 mg Oral q1800  . [MAR Hold] spironolactone  25 mg Oral Daily   Continuous Infusions: . amiodarone 30 mg/hr (06/12/16 0240)    Medical decision making is of high complexity and this patient is at high  risk of deterioration, therefore this is a level 3 visit.    LOS: 1 day   Chane Magner  [facility] Pager [phone]. If unable to reach me by pager, please call my cell phone at [phone].  *Please refer to [email or web address], password [email or web address] to get updated schedule on who will round on this patient, as hospitalists switch teams weekly. If 7PM-7AM, please contact night-coverage at [email or web address], password [email or web address] for any overnight needs.  06/12/2016, 8:29 AM

## 2016-06-12 NOTE — Progress Notes (Signed)
Patient has been resting well.  Denies any pain but still complaint that she is short of breath during exertion.

## 2016-06-13 ENCOUNTER — Other Ambulatory Visit (HOSPITAL_COMMUNITY): Payer: Medicare Other

## 2016-06-13 ENCOUNTER — Encounter (HOSPITAL_COMMUNITY): Payer: Self-pay | Admitting: Internal Medicine

## 2016-06-13 DIAGNOSIS — D500 Iron deficiency anemia secondary to blood loss (chronic): Secondary | ICD-10-CM

## 2016-06-13 LAB — CBC
HEMATOCRIT: 27.2 % — AB (ref 36.0–46.0)
HEMOGLOBIN: 7.6 g/dL — AB (ref 12.0–15.0)
MCH: 23 pg — ABNORMAL LOW (ref 26.0–34.0)
MCHC: 27.9 g/dL — ABNORMAL LOW (ref 30.0–36.0)
MCV: 82.4 fL (ref 78.0–100.0)
Platelets: 303 10*3/uL (ref 150–400)
RBC: 3.3 MIL/uL — AB (ref 3.87–5.11)
RDW: 16.8 % — ABNORMAL HIGH (ref 11.5–15.5)
WBC: 7.4 10*3/uL (ref 4.0–10.5)

## 2016-06-13 LAB — BASIC METABOLIC PANEL
ANION GAP: 7 (ref 5–15)
BUN: 13 mg/dL (ref 6–20)
CALCIUM: 9.5 mg/dL (ref 8.9–10.3)
CO2: 33 mmol/L — AB (ref 22–32)
Chloride: 98 mmol/L — ABNORMAL LOW (ref 101–111)
Creatinine, Ser: 1.08 mg/dL — ABNORMAL HIGH (ref 0.44–1.00)
GFR, EST AFRICAN AMERICAN: 56 mL/min — AB (ref >60)
GFR, EST NON AFRICAN AMERICAN: 49 mL/min — AB (ref >60)
Glucose, Bld: 118 mg/dL — ABNORMAL HIGH (ref 65–99)
POTASSIUM: 4.5 mmol/L (ref 3.5–5.1)
Sodium: 138 mmol/L (ref 135–145)

## 2016-06-13 MED ORDER — SERTRALINE HCL 100 MG PO TABS
100.0000 mg | ORAL_TABLET | Freq: Every day | ORAL | Status: DC
  Administered 2016-06-13 – 2016-06-17 (×5): 100 mg via ORAL
  Filled 2016-06-13 (×5): qty 1

## 2016-06-13 MED ORDER — CALCIUM CARBONATE-VITAMIN D 500-200 MG-UNIT PO TABS
2.0000 | ORAL_TABLET | Freq: Every day | ORAL | Status: DC
Start: 2016-06-14 — End: 2016-06-18
  Administered 2016-06-14 – 2016-06-18 (×5): 2 via ORAL
  Filled 2016-06-13 (×5): qty 2

## 2016-06-13 MED ORDER — PANTOPRAZOLE SODIUM 40 MG PO TBEC
40.0000 mg | DELAYED_RELEASE_TABLET | Freq: Every day | ORAL | Status: DC
  Administered 2016-06-13 – 2016-06-18 (×6): 40 mg via ORAL
  Filled 2016-06-13 (×6): qty 1

## 2016-06-13 MED ORDER — SODIUM CHLORIDE 0.9 % IV SOLN
510.0000 mg | Freq: Once | INTRAVENOUS | Status: AC
  Administered 2016-06-13: 510 mg via INTRAVENOUS
  Filled 2016-06-13: qty 17

## 2016-06-13 MED ORDER — ISOSORBIDE MONONITRATE ER 30 MG PO TB24
90.0000 mg | ORAL_TABLET | Freq: Every day | ORAL | Status: DC
  Administered 2016-06-13 – 2016-06-16 (×4): 90 mg via ORAL
  Filled 2016-06-13 (×4): qty 3

## 2016-06-13 MED ORDER — FUROSEMIDE 10 MG/ML IJ SOLN
80.0000 mg | Freq: Two times a day (BID) | INTRAMUSCULAR | Status: DC
  Administered 2016-06-13 – 2016-06-16 (×7): 80 mg via INTRAVENOUS
  Filled 2016-06-13 (×7): qty 8

## 2016-06-13 MED ORDER — POTASSIUM CHLORIDE CRYS ER 20 MEQ PO TBCR
40.0000 meq | EXTENDED_RELEASE_TABLET | Freq: Every day | ORAL | Status: DC
  Administered 2016-06-13 – 2016-06-16 (×4): 40 meq via ORAL
  Filled 2016-06-13 (×4): qty 2

## 2016-06-13 NOTE — Evaluation (Signed)
Occupational Therapy Evaluation Patient Details Name: [name] MRN: [id number] DOB: 1941-04-21 Today's Date: 06/13/2016    History of Present Illness PT is a 75 yo female admitted with SOB. Found to be in Afib adn underwent a cardioversion.  Pt also in CHF.  Pt with h/o COPD, CABG, CAD, pulmonary HTN, fall 2 years ago with fx of tail bone.  Pt with EF of 60%.   Clinical Impression   Pt admitted with the above diagnosis and has the deficits outlined below. Pt would benefit from cont OT to increase efficiency with adls and iadls and increase her knowledge of energy conservation techniques so she can eventually dc home with her husband when able to tolerate more activity without becoming SOB.  Pt has the physical ability to do adls but gets so SOB she cannot tolerate more than 2 minutes of activity at a time. Pt's son lives with her and her husband (who is a BLE amputee) but he works full time.  Not sure pt can function at home with her current breathing status.  O2 sats are above 95% of 2 L of O2 but dyspnea is 3/4.    Follow Up Recommendations  SNF;Supervision/Assistance - 24 hour    Equipment Recommendations  None recommended by OT    Recommendations for Other Services       Precautions / Restrictions Precautions Precautions: Fall Precaution Comments: Pt fell two years ago and feels like things have gone down hill ever since. Restrictions Weight Bearing Restrictions: No Other Position/Activity Restrictions: Monitor BP      Mobility Bed Mobility               General bed mobility comments: pt in chair on arrival  Transfers Overall transfer level: Needs assistance Equipment used: Rolling walker (2 wheeled) Transfers: Sit to/from [employer] Transfers Sit to Stand: Supervision Stand pivot transfers: Supervision       General transfer comment: Cues for hand placement.    Balance Overall balance assessment: Needs assistance Sitting-balance support:  Feet supported Sitting balance-Leahy Scale: Good     Standing balance support: Bilateral upper extremity supported;During functional activity Standing balance-Leahy Scale: Fair Standing balance comment: Pt able to let go of walker at times when standing during adls but due to extreme SOB, feel she is better off with walker at all times when on her feet. Pt has a rollator at home so she can sit when she becomes SOB.                            ADL Overall ADL's : Needs assistance/impaired Eating/Feeding: Independent;Sitting   Grooming: Oral care;Wash/dry face;Wash/dry hands;Supervision/safety;Sitting;Standing;Cueing for compensatory techniques Grooming Details (indicate cue type and reason): Pt unable to stand long enough to groom and needs rest breaks even when sitting to do her grooming.  Pt cannot tolerate being on her feet; becomes SOB. Upper Body Bathing: Set up;Sitting   Lower Body Bathing: Supervison/ safety;Sit to/from stand;Cueing for compensatory techniques Lower Body Bathing Details (indicate cue type and reason): given extra time, pt can complete with Supervision. Upper Body Dressing : Set up;Sitting   Lower Body Dressing: Supervision/safety;Sit to/from stand;Cueing for compensatory techniques Lower Body Dressing Details (indicate cue type and reason): cues given for energy conservation techniques. Pt requires rest breaks frequently. Toilet Transfer: Supervision/safety;Stand-pivot;BSC   Psychiatrist and Hygiene: Supervision/safety;Sitting/lateral lean       Functional mobility during ADLs: Min guard;Rolling walker General ADL  Comments: Pt can physically complete adls but gets extremely SOB requiring rest breaks every 3-5 minutes limiting being functional doing adls.     Vision Vision Assessment?: No apparent visual deficits   Perception Perception Perception Tested?: No   Praxis      Pertinent Vitals/Pain Pain Assessment: No/denies  pain     Hand Dominance Right   Extremity/Trunk Assessment Upper Extremity Assessment Upper Extremity Assessment: Generalized weakness (pt with bursitis in B shoulders.  )   Lower Extremity Assessment Lower Extremity Assessment: Defer to PT evaluation   Cervical / Trunk Assessment Cervical / Trunk Assessment: Normal   Communication Communication Communication: No difficulties   Cognition Arousal/Alertness: Awake/alert Behavior During Therapy: WFL for tasks assessed/performed Overall Cognitive Status: Within Functional Limits for tasks assessed                     General Comments       Exercises       Shoulder Instructions      Home Living Family/patient expects to be discharged to:: Private residence Living Arrangements: Spouse/significant other;Children Available Help at Discharge: Family;Available PRN/intermittently Type of Home: House Home Access: Ramped entrance     Home Layout: One level     Bathroom Shower/Tub: Producer, television/film/video: Standard Bathroom Accessibility: Yes How Accessible: Accessible via walker Home Equipment: Walker - 4 wheels;Shower seat;Bedside commode;Wheelchair - manual;Hand held Careers information officer - 2 wheels   Additional Comments: Pt lives at home with her spouse who is w/c bound due to BLE amputations--no prothetics and uses transfer board for transfers. Pt's son lives with them but works during the day. Pt states that her husband is independent with his power w/c and is able to get her things so she does not have to get up often.       Prior Functioning/Environment Level of Independence: Needs assistance  Gait / Transfers Assistance Needed: Walks with rollator very short distancees. Gets too SOB to go out much anymore. Used to go to store/dr etc and no longer even drives b/c she cant walk far enough to do anything.  Does not need Supervision for physical reasons; but does for endurance reasons. ADL's / Homemaking  Assistance Needed: Son now does all cooking/cleaning and home tasks. Pt can still bathe/dress/groom and toilet but she states this is very difficult now.   Comments: Has home O2 on 2L.  Not active much lately.  Is worried they will d/c her home and she will have to come right back.         OT Problem List: Decreased activity tolerance;Impaired balance (sitting and/or standing);Decreased knowledge of use of DME or AE;Cardiopulmonary status limiting activity   OT Treatment/Interventions: Self-care/ADL training;Energy conservation;Therapeutic activities    OT Goals(Current goals can be found in the care plan section) Acute Rehab OT Goals Patient Stated Goal: to be able to breathe OT Goal Formulation: With patient Time For Goal Achievement: 06/27/16 Potential to Achieve Goals: Fair ADL Goals Pt Will Perform Grooming: sitting;standing;with modified independence Additional ADL Goal #1: Pt will walk to bathroom and toilet including changing depends with mod I. Additional ADL Goal #2: Pt will dress gathing own clothes in 20 minutes with 2 rest breaks incorporating energy conservation techniques. Additional ADL Goal #3: Pt will state 2 new things she can do at home to attempt to save energy during adls adn Iadls without VCs.  OT Frequency: Min 2X/week   Barriers to D/C: Decreased caregiver support  Son lives with  she and her disabled husband but he does work full time.  Husband is bilateral amputee.         Co-evaluation              End of Session Equipment Utilized During Treatment: Rolling walker;Oxygen Nurse Communication: Mobility status;Other (comment) (BP 124/33)  Activity Tolerance: Patient limited by fatigue;Other (comment) (limited by SOB) Patient left: in chair;with call bell/phone within reach   Time: [id number] OT Time Calculation (min): 34 min Charges:  OT General Charges $OT Visit: 1 Procedure OT Evaluation $OT Eval Moderate Complexity: 1 Procedure OT  Treatments $Self Care/Home Management : 8-22 mins G-Codes:    Hope BuddsJones, Robbie Nangle Anne 06/13/2016, 10:27 AM  [phone]

## 2016-06-13 NOTE — Progress Notes (Signed)
Patient ID: [name], female   DOB: December 28, 1940, 75 y.o.   MRN: [id number]   SUBJECTIVE: Patient was admitted with increased dyspnea and noted to be in atrial fibrillation.  She had DCCV on 11/19 and remains in NSR today.  She is still short of breath when she walks around.   Scheduled Meds: . ferrous sulfate  325 mg Oral BID WC  . ferumoxytol  510 mg Intravenous Once  . furosemide  80 mg Intravenous BID  . isosorbide mononitrate  60 mg Oral Daily  . lisinopril  2.5 mg Oral Daily  . metoprolol  50 mg Oral BID  . potassium chloride  40 mEq Oral Daily  . rivaroxaban  20 mg Oral Q supper  . simvastatin  60 mg Oral q1800  . spironolactone  25 mg Oral Daily   Continuous Infusions: . amiodarone 30 mg/hr (06/13/16 0133)   PRN Meds:.    Vitals:   06/12/16 2000 06/13/16 0000 06/13/16 0349 06/13/16 0500  BP: (!) 109/99 105/72 (!) 132/46   Pulse: 71 67 (!) 58   Resp: 18 (!) 23 (!) 21   Temp: 98 F (36.7 C) 97.5 F (36.4 C) 97.5 F (36.4 C)   TempSrc: Oral Oral Oral   SpO2: 100% 98% 100%   Weight:    178 lb 14.4 oz (81.1 kg)  Height:        Intake/Output Summary (Last 24 hours) at 06/13/16 0836 Last data filed at 06/13/16 0600  Gross per 24 hour  Intake            607.4 ml  Output              102 ml  Net            505.4 ml    LABS: Basic Metabolic Panel:  Recent Labs  52/77/82 2157 06/12/16 0415 06/13/16 0237  NA  --  140 138  K  --  4.1 4.5  CL  --  97* 98*  CO2  --  34* 33*  GLUCOSE  --  108* 118*  BUN  --  13 13  CREATININE  --  1.19* 1.08*  CALCIUM  --  9.3 9.5  MG 2.1  --   --    Liver Function Tests: No results for input(s): AST, ALT, ALKPHOS, BILITOT, PROT, ALBUMIN in the last 72 hours. No results for input(s): LIPASE, AMYLASE in the last 72 hours. CBC:  Recent Labs  06/11/16 1442  WBC 6.5  NEUTROABS 4.0  HGB 8.5*  HCT 28.9*  MCV 80.7  PLT 336   Cardiac Enzymes:  Recent Labs  06/11/16 2157 06/12/16 0415 06/12/16 0947  TROPONINI  0.06* 0.05* 0.04*   BNP: Invalid input(s): POCBNP D-Dimer: No results for input(s): DDIMER in the last 72 hours. Hemoglobin A1C: No results for input(s): HGBA1C in the last 72 hours. Fasting Lipid Panel: No results for input(s): CHOL, HDL, LDLCALC, TRIG, CHOLHDL, LDLDIRECT in the last 72 hours. Thyroid Function Tests: No results for input(s): TSH, T4TOTAL, T3FREE, THYROIDAB in the last 72 hours.  Invalid input(s): FREET3 Anemia Panel:  Recent Labs  06/11/16 1942  FERRITIN 9*    RADIOLOGY: Dg Chest 2 View  Result Date: 06/11/2016 CLINICAL DATA:  Short of breath for 1 month EXAM: CHEST  2 VIEW COMPARISON:  11/17/2015 FINDINGS: Heart is moderately enlarged. Mild vascular congestion. No interstitial edema. No consolidation or mass. No pneumothorax or pleural effusion. Stable L1 compression fracture. IMPRESSION: Cardiomegaly and mild vascular  congestion. Electronically Signed   By: Jolaine ClickArthur  Hoss M.D.   On: 06/11/2016 14:39    PHYSICAL EXAM General: NAD Neck: JVP 8-9 cm, no thyromegaly or thyroid nodule.  Lungs: Distant breath sounds. CV: Nondisplaced PMI.  Heart regular S1/S2, no S3/S4, 1/6 SEM RUSB. 1+ ankle edema.   Abdomen: Soft, nontender, no hepatosplenomegaly, no distention.  Neurologic: Alert and oriented x 3.  Psych: Normal affect. Extremities: No clubbing or cyanosis.   TELEMETRY: Reviewed telemetry pt in NSR  ASSESSMENT AND PLAN: 75 yo with h/o chronic diastolic CHF, valvular heart disease (mitral stenosis), PAF, CAD s/p CABG, severe COPD on home oxygen, and chronic anemia was admitted with increased dyspnea and atrial fibrillation with RVR.  1. Acute on chronic diastolic CHF/valvular heart disease: TEE in 3/17 showed normal EF, mild to moderate mitral stenosis. Most recent RHC in 7/17 showed near-normal filling pressures.  I saw her about a week ago in the office, volume status was good at that point.  Now does have some volume overload, suspect related to going into  atrial fibrillation with RVR.  Now back in NSR.    - Lasix 80 mg IV bid today, reassess volume status tomorrow. Follow strict I/Os.  - Continue 25 mg spironolactone daily.    2. CAD S/P CABG:  She has potential source of angina from 90% stenosis in distal LCx.  The vessel is small at this point and not amenable to PCI. Mild troponin increase this admission (peak 0.06) likely due to CHF exacerbation.  - Continue statin and metoprolol.  - On Xarelto so no aspirin.  - Imdur 90 daily.  3. Atrial fibrillation: Paroxysmal. CHADSVASC score is 4 (age > 9165, female, HTN, CHF).  She was admitted with atrial fibrillation recurrence and CHF exacerbation, now s/p DCCV on 11/19.  She remains in NSR on amiodarone gtt.  - Continue Xarelto/metoprolol.  - Continue amiodarone gtt today, convert to po tomorrow.  Long-term amiodarone is going to be difficult given her baseline lung disease, but with size of her atrium, this is likely going to be the only way to keep her in NSR long-term.  4. Pulmonary HTN: This is primarily pulmonary venous hypertension based on most recent RHC. 5. COPD: Followed by Dr Maple HudsonYoung. PFTs in 2/17 with moderate to severe obstruction.  On 2 liters [place].  This is a major contributor to her dyspnea.  6.  OSA: intolerant CPAP 7.  Mitral stenosis: Rheumatic mitral stenosis.  Mild to moderate mitral stenosis by TEE in 3/17.  Unable to assess by cath in 3/17 because she had aortic valve vegetation and the valve was not crossed. Unable to assess by cath in 7/17 because she had transient complete heart block when the LV was entered. Treating medically for now.  Would be high risk for surgery with severe COPD.  However, possibly could be valvuloplasty candidate.  Getting repeat echo.  8. Aortic valve vegetation: Noted on TEE in 3/17 done to assess mitral stenosis.  No fever, blood cultures negative.  May have come from prior dental infection.  ID did not recommend antibiotics.  9. Anemia: Chronic anemia,  appears to be near her baseline.  Needs CBC today.  Ferritin is very low, will give IV iron today.   [name] 06/13/2016 8:44 AM

## 2016-06-13 NOTE — Progress Notes (Signed)
Physical Therapy Evaluation Patient Details Name: [name] MRN: [id number] DOB: 07/03/1941 Today's Date: 06/13/2016   History of Present Illness  PT is a 75 yo female admitted with SOB. Found to be in Afib adn underwent a cardioversion.  Pt also in CHF.  Pt with h/o COPD, CABG, CAD, pulmonary HTN, fall 2 years ago with fx of tail bone.  Pt with EF of 60%.  Clinical Impression  Pt admitted with above diagnosis and presents to PT with functional limitations due to deficits listed below (See PT problem list). Pt needs skilled PT to maximize independence and safety to allow discharge to SNF vs home. Pt primarily limited by dyspnea 3/4 to 4/4 with any activity. Will be difficult to manage daily requirements at home with this level of dyspnea. If dyspnea improved may be able to go straight home.     Follow Up Recommendations SNF (May not need if dyspnea improves.)    Equipment Recommendations  None recommended by PT    Recommendations for Other Services       Precautions / Restrictions Precautions Precautions: Fall Precaution Comments: Pt fell two years ago and feels like things have gone down hill ever since. Restrictions Weight Bearing Restrictions: No      Mobility  Bed Mobility               General bed mobility comments: pt in chair on arrival  Transfers Overall transfer level: Needs assistance Equipment used: Rolling walker (2 wheeled) Transfers: Sit to/from Stand Sit to Stand: Supervision;Min assist         General transfer comment: supervision from chair or rollator and min A from low toilet.  Ambulation/Gait Ambulation/Gait assistance: Min guard Ambulation Distance (Feet): 15 Feet (15' x 1, 12' x 1) Assistive device: 4-wheeled walker Gait Pattern/deviations: Step-through pattern;Decreased step length - right;Decreased step length - left;Trunk flexed Gait velocity: decr Gait velocity interpretation: Below normal speed for age/gender General Gait  Details: Pt limited by severe dyspnea 3/4 with any activity. SpO2 >97%. On 3L of O2. Pt required sitting rest breaks x 4 with amb to door and then using the restroom.   Stairs            Wheelchair Mobility    Modified Rankin (Stroke Patients Only)       Balance Overall balance assessment: Needs assistance Sitting-balance support: No upper extremity supported;Feet supported Sitting balance-Leahy Scale: Good     Standing balance support: No upper extremity supported Standing balance-Leahy Scale: Fair                               Pertinent Vitals/Pain Pain Assessment: No/denies pain    Home Living Family/patient expects to be discharged to:: Private residence Living Arrangements: Spouse/significant other;Children Available Help at Discharge: Family;Available PRN/intermittently Type of Home: House Home Access: Ramped entrance     Home Layout: One level Home Equipment: Walker - 4 wheels;Shower seat;Bedside commode;Wheelchair - manual;Hand held Careers information officershower head;Walker - 2 wheels Additional Comments: Pt lives at home with her spouse who is w/c bound due to BLE amputations--no prothetics and uses transfer board for transfers. Pt's son lives with them but works during the day. Pt states that her husband is independent with his power w/c and is able to get her things so she does not have to get up often.     Prior Function Level of Independence: Needs assistance   Gait / Transfers [facility]. Gets too SOB to go out much anymore. Used to go to store/dr etc and no longer even drives b/c she cant walk far enough to do anything.  Does not need Supervision for physical reasons; but does for endurance reasons.  ADL's / Homemaking Assistance Needed: Son now does all cooking/cleaning and home tasks. Pt can still bathe/dress/groom and toilet but she states this is very difficult now.  Comments: Has home O2 on 2L.  Not active much  lately.  Is worried they will d/c her home and she will have to come right back.      Hand Dominance   Dominant Hand: Right    Extremity/Trunk Assessment   Upper Extremity Assessment: Defer to OT evaluation           Lower Extremity Assessment: Generalized weakness      Cervical / Trunk Assessment: Normal  Communication   Communication: No difficulties  Cognition Arousal/Alertness: Awake/alert Behavior During Therapy: WFL for tasks assessed/performed Overall Cognitive Status: Within Functional Limits for tasks assessed                      General Comments      Exercises     Assessment/Plan    PT Assessment Patient needs continued PT services  PT Problem List Decreased strength;Decreased activity tolerance;Decreased mobility          PT Treatment Interventions DME instruction;Gait training;Functional mobility training;Therapeutic activities;Therapeutic exercise;Balance training;Patient/family education    PT Goals (Current goals can be found in the Care Plan section)  Acute Rehab PT Goals Patient Stated Goal: to be able to breathe PT Goal Formulation: With patient Time For Goal Achievement: 06/20/16 Potential to Achieve Goals: Good    Frequency Min 3X/week   Barriers to discharge        Co-evaluation               End of Session Equipment Utilized During Treatment: Oxygen Activity Tolerance: Patient limited by fatigue;Other (comment) (dyspnea) Patient left: in chair;with call bell/phone within reach Nurse Communication: Mobility status         Time: 1010-1050 PT Time Calculation (min) (ACUTE ONLY): 40 min   Charges:   PT Evaluation $PT Eval Moderate Complexity: 1 Procedure PT Treatments $Gait Training: 8-22 mins   PT G CodesAngelina Ok Lloyd June 14, 2016, 2:02 PM [employer] PT [phone]

## 2016-06-13 NOTE — Progress Notes (Signed)
[place] TEAM 1 - Stepdown/ICU TEAM  [name]  VOJ:[id number] DOB: Dec 18, 1940 DOA: 06/11/2016 PCP: Astrid Divine, MD    Brief Narrative:  75 y.o. female with a Hx of severe COPD, CAD status post CABG, GERD, rheumatic heart disease, and pulmonary hypertension who was admitted 06/11/16 for worsening shortness of breath and chest pain. Patient had a heart catheterization 7/17 which showed patent bypass grafts and a 90% stenosis in the distal left circumflex that was not amenable to PCI. EF 60% by echocardiogram 3/17. This study also showed a vegetation on the aortic valve, rheumatic mitral stenosis, and a small PFO. PFTs done 2/17 showed moderate-severe obstructive airway disease. A sleep study was negative for OSA. Upon initial evaluation in the ED, patient's BNP was elevated and she was found to be in atrial fibrillation. Cardiology was consulted.  Subjective: The patient is resting comfortably in bed.  She denies chest pain nausea vomiting abdominal pain or shortness of breath when at rest.  She does report however that she gets extremely short of breath even the slightest bit of exertion.  This is not normal for her.  Assessment & Plan:  Acute on chronic diastolic heart failure Continue with attempts to diurese - exacerbation felt to be due to RVR - baseline wgt appears to be 81-82kg - net negative ~100cc since admit   Filed Weights   06/11/16 1255 06/11/16 2140 06/13/16 0500  Weight: 80.4 kg (177 lb 3.2 oz) 80.4 kg (177 lb 3.2 oz) 81.1 kg (178 lb 14.4 oz)    Paroxysmal atrial fibrillation w/ RVR CHA2DS2 - VASc is 4 - s/p cardioversion this hospitalization - on Xarelto chronically - Cards following   Normocytic anemia iron deficient - need to consider GI evaluation but on chronic anti-coagulation - this will need to be coordinated in the outpatient setting once it is safe to hold blood thinners - if hemoglobin continues to fall however may have to consider inpatient  evaluation  CAD s/p CABG Catheterization 7/17 showed patent bypass grafts with a 90% stenosis in the distal left circumflex not amenable to PCI. Continue nitrates. Mild troponin elevation likely from demand ischemia in the setting of atrial fibrillation.  Pulmonary venous hypertension  Per [facility] - Cardiology following   Acute on chronic respiratory failure with hypoxia  Due to above   COPD Followed by Dr. 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 F/U 2 D Echo  Aortic valve vegetation Per TEE March 2017 - no evidence of active bacteremia   DVT prophylaxis: Xarelto  Code Status: FULL CODE Family Communication: no family present at time of exam  Disposition Plan: SDU  Consultants:  [facility]  Procedures: 11/19 cardioversion  Antimicrobials:  None  Objective: Blood pressure (!) 130/101, pulse (!) 58, temperature 98.1 F (36.7 C), temperature source Oral, resp. rate (!) 21, height 5\' 6"  (1.676 m), weight 81.1 kg (178 lb 14.4 oz), SpO2 100 %.  Intake/Output Summary (Last 24 hours) at 06/13/16 1633 Last data filed at 06/13/16 1215  Gross per 24 hour  Intake            353.8 ml  Output  302 ml  Net             51.8 ml   Filed Weights   06/11/16 1255 06/11/16 2140 06/13/16 0500  Weight: 80.4 kg (177 lb 3.2 oz) 80.4 kg (177 lb 3.2 oz) 81.1 kg (178 lb 14.4 oz)    Examination: General: No acute respiratory distress at rest  Lungs: Distant breath sounds throughout all fields without wheezing or focal crackles Cardiovascular: Regular rate without murmur gallop or rub normal S1  and S2 Abdomen: Nontender, nondistended, soft, bowel sounds positive, no rebound, no ascites, no appreciable mass Extremities: No significant cyanosis - trace edema bilateral lower extremities  CBC:  Recent Labs Lab 06/11/16 1442 06/13/16 0933  WBC 6.5 7.4  NEUTROABS 4.0  --   HGB 8.5* 7.6*  HCT 28.9* 27.2*  MCV 80.7 82.4  PLT 336 303   Basic Metabolic Panel:  Recent Labs Lab 06/11/16 1442 06/11/16 2157 06/12/16 0415 06/13/16 0237  NA 142  --  140 138  K 3.2*  --  4.1 4.5  CL 95*  --  97* 98*  CO2 36*  --  34* 33*  GLUCOSE 89  --  108* 118*  BUN 12  --  13 13  CREATININE 0.98  --  1.19* 1.08*  CALCIUM 9.5  --  9.3 9.5  MG  --  2.1  --   --    GFR: Estimated Creatinine Clearance: 47.6 mL/min (by C-G formula based on SCr of 1.08 mg/dL (H)).  Liver Function Tests: No results for input(s): AST, ALT, ALKPHOS, BILITOT, PROT, ALBUMIN in the last 168 hours. No results for input(s): LIPASE, AMYLASE in the last 168 hours. No results for input(s): AMMONIA in the last 168 hours.  Coagulation Profile: No results for input(s): INR, PROTIME in the last 168 hours.  Cardiac Enzymes:  Recent Labs Lab 06/11/16 1442 06/11/16 2157 06/12/16 0415 06/12/16 0947  TROPONINI 0.07* 0.06* 0.05* 0.04*    Recent Results (from the past 240 hour(s))  MRSA PCR Screening     Status: None   Collection Time: 06/11/16  9:33 PM  Result Value Ref Range Status   MRSA by PCR NEGATIVE NEGATIVE Final    Comment:        The GeneXpert MRSA Assay (FDA approved for NASAL specimens only), is one component of a comprehensive MRSA colonization surveillance program. It is not intended to diagnose MRSA infection nor to guide or monitor treatment for MRSA infections.      Scheduled Meds: . ferrous sulfate  325 mg Oral BID WC  . ferumoxytol  510 mg Intravenous Once  . furosemide  80 mg Intravenous BID  . isosorbide mononitrate  90 mg Oral Daily  . lisinopril  2.5 mg Oral Daily  . metoprolol   50 mg Oral BID  . pantoprazole  40 mg Oral Daily  . potassium chloride  40 mEq Oral Daily  . rivaroxaban  20 mg Oral Q supper  . simvastatin  60 mg Oral q1800  . spironolactone  25 mg Oral Daily   Continuous Infusions: . amiodarone 30 mg/hr (06/13/16 0133)     LOS: 2 days   Lonia BloodJeffrey T. Azhane Eckart, MD [facility] Office  [phone] Pager - Text Page per Loretha StaplerAmion as per below:  On-Call/Text Page:      [email or web address]      password [email or web address]  If 7PM-7AM, please contact night-coverage [email or web address] Password [email or web address] 06/13/2016, 4:33 PM

## 2016-06-14 ENCOUNTER — Inpatient Hospital Stay (HOSPITAL_COMMUNITY): Admission: RE | Admit: 2016-06-14 | Payer: Medicare Other | Source: Ambulatory Visit

## 2016-06-14 ENCOUNTER — Inpatient Hospital Stay (HOSPITAL_COMMUNITY): Payer: Medicare Other

## 2016-06-14 DIAGNOSIS — I069 Rheumatic aortic valve disease, unspecified: Secondary | ICD-10-CM | POA: Diagnosis present

## 2016-06-14 DIAGNOSIS — I330 Acute and subacute infective endocarditis: Secondary | ICD-10-CM

## 2016-06-14 DIAGNOSIS — I509 Heart failure, unspecified: Secondary | ICD-10-CM

## 2016-06-14 DIAGNOSIS — I050 Rheumatic mitral stenosis: Secondary | ICD-10-CM | POA: Diagnosis present

## 2016-06-14 DIAGNOSIS — I5033 Acute on chronic diastolic (congestive) heart failure: Secondary | ICD-10-CM | POA: Diagnosis present

## 2016-06-14 DIAGNOSIS — G4733 Obstructive sleep apnea (adult) (pediatric): Secondary | ICD-10-CM

## 2016-06-14 DIAGNOSIS — I272 Pulmonary hypertension, unspecified: Secondary | ICD-10-CM

## 2016-06-14 LAB — COMPREHENSIVE METABOLIC PANEL
ALT: 10 U/L — ABNORMAL LOW (ref 14–54)
ANION GAP: 8 (ref 5–15)
AST: 13 U/L — AB (ref 15–41)
Albumin: 3.2 g/dL — ABNORMAL LOW (ref 3.5–5.0)
Alkaline Phosphatase: 56 U/L (ref 38–126)
BUN: 11 mg/dL (ref 6–20)
CALCIUM: 9.3 mg/dL (ref 8.9–10.3)
CO2: 35 mmol/L — AB (ref 22–32)
CREATININE: 1.03 mg/dL — AB (ref 0.44–1.00)
Chloride: 98 mmol/L — ABNORMAL LOW (ref 101–111)
GFR, EST AFRICAN AMERICAN: 60 mL/min — AB (ref >60)
GFR, EST NON AFRICAN AMERICAN: 51 mL/min — AB (ref >60)
GLUCOSE: 112 mg/dL — AB (ref 65–99)
Potassium: 3.6 mmol/L (ref 3.5–5.1)
Sodium: 141 mmol/L (ref 135–145)
TOTAL PROTEIN: 6.5 g/dL (ref 6.5–8.1)
Total Bilirubin: 0.5 mg/dL (ref 0.3–1.2)

## 2016-06-14 LAB — CBC
HCT: 26.8 % — ABNORMAL LOW (ref 36.0–46.0)
Hemoglobin: 7.6 g/dL — ABNORMAL LOW (ref 12.0–15.0)
MCH: 23.4 pg — ABNORMAL LOW (ref 26.0–34.0)
MCHC: 28.4 g/dL — ABNORMAL LOW (ref 30.0–36.0)
MCV: 82.5 fL (ref 78.0–100.0)
PLATELETS: 310 10*3/uL (ref 150–400)
RBC: 3.25 MIL/uL — ABNORMAL LOW (ref 3.87–5.11)
RDW: 17 % — AB (ref 11.5–15.5)
WBC: 9.6 10*3/uL (ref 4.0–10.5)

## 2016-06-14 LAB — BASIC METABOLIC PANEL
Anion gap: 10 (ref 5–15)
BUN: 10 mg/dL (ref 6–20)
CALCIUM: 9.3 mg/dL (ref 8.9–10.3)
CO2: 30 mmol/L (ref 22–32)
CREATININE: 0.96 mg/dL (ref 0.44–1.00)
Chloride: 100 mmol/L — ABNORMAL LOW (ref 101–111)
GFR calc Af Amer: >60 mL/min (ref >60)
GFR, EST NON AFRICAN AMERICAN: 56 mL/min — AB (ref >60)
GLUCOSE: 93 mg/dL (ref 65–99)
Potassium: 3.6 mmol/L (ref 3.5–5.1)
SODIUM: 140 mmol/L (ref 135–145)

## 2016-06-14 LAB — HEMOGLOBIN AND HEMATOCRIT, BLOOD
HCT: 29.6 % — ABNORMAL LOW (ref 36.0–46.0)
HEMOGLOBIN: 8.6 g/dL — AB (ref 12.0–15.0)

## 2016-06-14 LAB — VITAMIN B12: Vitamin B-12: 515 pg/mL (ref 180–914)

## 2016-06-14 LAB — ECHOCARDIOGRAM COMPLETE
HEIGHTINCHES: 66 in
WEIGHTICAEL: 2912 [oz_av]

## 2016-06-14 LAB — RETICULOCYTES
RBC.: 3.25 MIL/uL — ABNORMAL LOW (ref 3.87–5.11)
RETIC COUNT ABSOLUTE: 87.8 10*3/uL (ref 19.0–186.0)
RETIC CT PCT: 2.7 % (ref 0.4–3.1)

## 2016-06-14 LAB — IRON AND TIBC
Iron: 552 ug/dL — ABNORMAL HIGH (ref 28–170)
Saturation Ratios: 113 % — ABNORMAL HIGH (ref 10.4–31.8)
TIBC: 489 ug/dL — ABNORMAL HIGH (ref 250–450)

## 2016-06-14 LAB — PREPARE RBC (CROSSMATCH)

## 2016-06-14 LAB — FOLATE: Folate: 13.2 ng/mL (ref >5.9)

## 2016-06-14 LAB — D-DIMER, QUANTITATIVE (NOT AT ARMC): D DIMER QUANT: 0.35 ug{FEU}/mL (ref 0.00–0.50)

## 2016-06-14 MED ORDER — POTASSIUM CHLORIDE CRYS ER 20 MEQ PO TBCR
40.0000 meq | EXTENDED_RELEASE_TABLET | Freq: Once | ORAL | Status: AC
  Administered 2016-06-14: 40 meq via ORAL
  Filled 2016-06-14: qty 2

## 2016-06-14 MED ORDER — SODIUM CHLORIDE 0.9 % IV SOLN
Freq: Once | INTRAVENOUS | Status: AC
  Administered 2016-06-14: 10 mL/h via INTRAVENOUS

## 2016-06-14 MED ORDER — PROMETHAZINE HCL 25 MG/ML IJ SOLN: 6.2500 mg | INTRAMUSCULAR | Status: DC | PRN

## 2016-06-14 MED ORDER — AMIODARONE HCL 200 MG PO TABS
400.0000 mg | ORAL_TABLET | Freq: Two times a day (BID) | ORAL | Status: DC
  Administered 2016-06-14 – 2016-06-18 (×9): 400 mg via ORAL
  Filled 2016-06-14 (×9): qty 2

## 2016-06-14 NOTE — Care Management Important Message (Signed)
Important Message  Patient Details  Name: [name] MRN: [id number] Date of Birth: October 21, 1940   Medicare Important Message Given:  Yes    Elliot Cousin, RN 06/14/2016, 2:46 PM

## 2016-06-14 NOTE — Progress Notes (Signed)
Pt ambulated very well to nursing station and back with walker and 2L O2 without much SOB.   Pts output has decreased post lasix treatment and complaining of dry mouth.

## 2016-06-14 NOTE — Progress Notes (Signed)
  Echocardiogram 2D Echocardiogram has been performed.  Leta Jungling M 06/14/2016, 12:03 PM

## 2016-06-14 NOTE — Care Management Note (Addendum)
Case Management Note  Patient Details  Name: [name] MRN: [id number] Date of Birth: 1940/09/04  Subjective/Objective:    CHF,  Anemia             Action/Plan: Discharge Planning:   NCM spoke to pt and lives at home with husband. States husband is wheelchair bound but manages well. They assist each other at home. States her adult children will assist with care and has an adult son that lives in the home. Pt has home oxygen, wheelchair, bedside commode and RW at home. Contacted Mandy with [employer] (oxygen provider), they will have technician go out to the home tonight or in am to verify oxygen is working properly. States she does not have a neb machine at home. Offered choice for [facility] and pt states she has RN coming from [facility]. Contacted [facility] and care ended in Sept. Message to attending for [facility] RN / F2F orders for [facility] RN.   Received call from pt's [facility] RN, Marylene Land to follow up on dc planning.   Pt states she receives Xarelto for free through manufacture patient assistance program for free.   PCP Maurice Small MD  Expected Discharge Date:                 Expected Discharge Plan:  Home w Home Health Services  In-House Referral:  Clinical Social Work  Discharge planning Services  CM Consult  Post Acute Care Choice:  Home Health, Resumption of Svcs/PTA Provider Choice offered to:  Patient  DME Arranged:  N/A DME Agency:  NA  HH Arranged:  RN HH Agency:  [facility]  Status of Service:  Completed, signed off  If discussed at Long Length of Stay Meetings, dates discussed:    Additional Comments:  Elliot Cousin, RN 06/14/2016, 2:54 PM

## 2016-06-14 NOTE — Progress Notes (Signed)
PROGRESS NOTE    APPIE DAMM  SFK:[id number] DOB: 07-13-1941 DOA: 06/11/2016 PCP: Astrid Divine, MD   Brief Narrative 75 y.o.WF PMHx Anxiety,Depression, severe COPD (on 2 L O2), OSA Chronic Diastolic CHF, Paroxysmal Atrial Fibrillation CAD S/P CABG, Rheumatic heart disease, and Pulmonary Hypertension,GERD,   who was admitted 06/11/16 for worsening shortness of breath and chest pain. Patient had a heart catheterization 7/17 which showed patent bypass grafts and a 90% stenosis in the distal left circumflex that was not amenable to PCI. EF 60% by echocardiogram 3/17. This study also showed a vegetation on the aortic valve, rheumatic mitral stenosis, and a small PFO. PFTs done 2/17 showed moderate-severe obstructive airway disease. A sleep study was negative for OSA. Upon initial evaluation in the ED, patient's BNP was elevated and she was found to be in atrial fibrillation. Cardiology was consulted.   Subjective: 11/21  A/O 4, NAD. States on home O2 2 L at rest, 3 L with exertion.   Assessment & Plan:   Principal Problem:   Acute on chronic diastolic heart failure (HCC) Active Problems:   Coronary atherosclerosis   Pulmonary hypertension (HCC)   COPD mixed type (HCC)   Chronic anticoagulation   HTN (hypertension)   Dyslipidemia, goal LDL below 70   Mitral stenosis   Hypokalemia   Acute on chronic respiratory failure with hypoxia and hypercapnia (HCC)   Acute kidney injury (HCC)   Rheumatic mitral valve disease   Atrial fibrillation with RVR (HCC)   Absolute anemia   Acute on chronic Diastolic heart failure(baseline wgt 81-82kg) Continue with attempts to diurese - exacerbation felt to be due to RVR -Strict in and out since admission +924 ml -Daily weight Filed Weights   06/11/16 2140 06/13/16 0500 06/14/16 0800  Weight: 80.4 kg (177 lb 3.2 oz) 81.1 kg (178 lb 14.4 oz) 82.6 kg (182 lb)  -Transfuse for hemoglobin<8 -11/21 transfuse one unit PRBC  Paroxysmal  atrial fibrillation w/ RVR CHA2DS2 - VASc is 4 - s/p cardioversion this hospitalization - on Xarelto chronically - Cards following   Normocytic anemia -iron deficient - need to consider GI evaluation but on chronic anti-coagulation - this will need to be coordinated in the outpatient setting once it is safe to hold blood thinners - if hemoglobin continues to fall however may have to consider inpatient evaluation Recent Labs Lab 06/11/16 1442 06/13/16 0933 06/14/16 0253  HGB 8.5* 7.6* 7.6*  -occult blood 3 -Occult blood pending: May need to consider holding Xarelto if positive  CAD s/p CABG -Catheterization 7/17 showed patent bypass grafts with a 90% stenosis in the distal left circumflex not amenable to PCI. Continue nitrates. Mild troponin elevation likely from demand ischemia in the setting of atrial fibrillation.  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 F/U 2 D Echo  Aortic valve vegetation Per TEE March 2017 - no evidence of active bacteremia  HTN  Blood pressure currently reasonably controlled  Acute on chronic respiratory failure with hypoxia  Due to above   COPD Followed by Dr. Maple Hudson as an outpatient  OSA Unable to tolerate CPAP  Dyslipidemia Continue Zocor  Hypokalemia Repleted  Acute kidney injury  Creatinine stable at present - follow with renewed attempts to diurese Lab Results  Component Value Date   CREATININE 0.96 06/14/2016   CREATININE 1.03 (H) 06/14/2016   CREATININE 1.08 (H) 06/13/2016      DVT prophylaxis: Xarelto Code Status:  Full code Family Communication: None Disposition Plan: Home   Consultants:  Cardiology  Procedures/Significant Events:  11/19 cardioversion 11/21 transfuse one unit PRBC   VENTILATOR SETTINGS:    Cultures   Antimicrobials: None   Devices    LINES / TUBES:      Continuous  Infusions:   Objective: Vitals:   06/13/16 2007 06/13/16 2300 06/14/16 0351 06/14/16 0800  BP: 105/67 91/81 (!) 109/51   Pulse: 73 72 (!) 57   Resp: (!) 21 20 (!) 22   Temp: 98.9 F (37.2 C) 97.9 F (36.6 C) 97.7 F (36.5 C)   TempSrc: Axillary Oral Oral   SpO2: 99% 98% 99%   Weight:    82.6 kg (182 lb)  Height:        Intake/Output Summary (Last 24 hours) at 06/14/16 0934 Last data filed at 06/14/16 0800  Gross per 24 hour  Intake           1190.7 ml  Output              500 ml  Net            690.7 ml   Filed Weights   06/11/16 2140 06/13/16 0500 06/14/16 0800  Weight: 80.4 kg (177 lb 3.2 oz) 81.1 kg (178 lb 14.4 oz) 82.6 kg (182 lb)    Examination:  General: A/O 4, NAD, positive chronic respiratory distress Eyes: negative scleral hemorrhage, negative anisocoria, negative icterus ENT: Negative Runny nose, negative gingival bleeding, Neck:  Negative scars, masses, torticollis, lymphadenopathy, JVD Lungs: Clear to auscultation bilaterally without wheezes or crackles Cardiovascular: Regular rate and rhythm without murmur gallop or rub normal S1 and S2 Abdomen: negative abdominal pain, nondistended, positive soft, bowel sounds, no rebound, no ascites, no appreciable mass Extremities: No significant cyanosis, clubbing, or edema bilateral lower extremities Skin: Negative rashes, lesions, ulcers Psychiatric:  Negative depression, negative anxiety, negative fatigue, negative mania  Central nervous system:  Cranial nerves II through XII intact, tongue/uvula midline, all extremities muscle strength 5/5, sensation intact throughout,  negative dysarthria, negative expressive aphasia, negative receptive aphasia.  .     Data Reviewed: Care during the described time interval was provided by me .  I have reviewed this patient's available data, including medical history, events of note, physical examination, and all test results as part of my evaluation. I have personally reviewed  and interpreted all radiology studies.  CBC:  Recent Labs Lab 06/11/16 1442 06/13/16 0933 06/14/16 0253  WBC 6.5 7.4 9.6  NEUTROABS 4.0  --   --   HGB 8.5* 7.6* 7.6*  HCT 28.9* 27.2* 26.8*  MCV 80.7 82.4 82.5  PLT 336 303 310   Basic Metabolic Panel:  Recent Labs Lab 06/11/16 1442 06/11/16 2157 06/12/16 0415 06/13/16 0237 06/14/16 0253  NA 142  --  140 138 141  K 3.2*  --  4.1 4.5 3.6  CL 95*  --  97* 98* 98*  CO2 36*  --  34* 33* 35*  GLUCOSE 89  --  108* 118* 112*  BUN 12  --  13 13 11   CREATININE 0.98  --  1.19* 1.08* 1.03*  CALCIUM 9.5  --  9.3 9.5 9.3  MG  --  2.1  --   --   --    GFR: Estimated Creatinine Clearance: 50.3 mL/min (by C-G formula based on SCr of 1.03 mg/dL (H)). Liver Function Tests:  Recent Labs Lab 06/14/16 0253  AST 13*  ALT 10*  ALKPHOS  56  BILITOT 0.5  PROT 6.5  ALBUMIN 3.2*   No results for input(s): LIPASE, AMYLASE in the last 168 hours. No results for input(s): AMMONIA in the last 168 hours. Coagulation Profile: No results for input(s): INR, PROTIME in the last 168 hours. Cardiac Enzymes:  Recent Labs Lab 06/11/16 1442 06/11/16 2157 06/12/16 0415 06/12/16 0947  TROPONINI 0.07* 0.06* 0.05* 0.04*   BNP (last 3 results) No results for input(s): PROBNP in the last 8760 hours. HbA1C: No results for input(s): HGBA1C in the last 72 hours. CBG: No results for input(s): GLUCAP in the last 168 hours. Lipid Profile: No results for input(s): CHOL, HDL, LDLCALC, TRIG, CHOLHDL, LDLDIRECT in the last 72 hours. Thyroid Function Tests: No results for input(s): TSH, T4TOTAL, FREET4, T3FREE, THYROIDAB in the last 72 hours. Anemia Panel:  Recent Labs  06/11/16 1942 06/14/16 0253  VITAMINB12  --  515  FOLATE  --  13.2  FERRITIN 9*  --   TIBC  --  489*  IRON  --  552*  RETICCTPCT  --  2.7   Urine analysis:    Component Value Date/Time   COLORURINE YELLOW 06/11/2016 1442   APPEARANCEUR CLEAR 06/11/2016 1442   LABSPEC  1.005 06/11/2016 1442   PHURINE 7.5 06/11/2016 1442   GLUCOSEU NEGATIVE 06/11/2016 1442   HGBUR NEGATIVE 06/11/2016 1442   BILIRUBINUR NEGATIVE 06/11/2016 1442   KETONESUR NEGATIVE 06/11/2016 1442   PROTEINUR NEGATIVE 06/11/2016 1442   NITRITE NEGATIVE 06/11/2016 1442   LEUKOCYTESUR NEGATIVE 06/11/2016 1442   Sepsis Labs: @LABRCNTIP (procalcitonin:4,lacticidven:4)  ) Recent Results (from the past 240 hour(s))  MRSA PCR Screening     Status: None   Collection Time: 06/11/16  9:33 PM  Result Value Ref Range Status   MRSA by PCR NEGATIVE NEGATIVE Final    Comment:        The GeneXpert MRSA Assay (FDA approved for NASAL specimens only), is one component of a comprehensive MRSA colonization surveillance program. It is not intended to diagnose MRSA infection nor to guide or monitor treatment for MRSA infections.          Radiology Studies: No results found.      Scheduled Meds: . amiodarone  400 mg Oral BID  . calcium-vitamin D  2 tablet Oral Q breakfast  . ferrous sulfate  325 mg Oral BID WC  . furosemide  80 mg Intravenous BID  . isosorbide mononitrate  90 mg Oral Daily  . lisinopril  2.5 mg Oral Daily  . metoprolol  50 mg Oral BID  . pantoprazole  40 mg Oral Daily  . potassium chloride  40 mEq Oral Daily  . rivaroxaban  20 mg Oral Q supper  . sertraline  100 mg Oral QHS  . simvastatin  60 mg Oral q1800  . spironolactone  25 mg Oral Daily   Continuous Infusions:   LOS: 3 days    Time spent: 40 minutes    Yaritzel Stange, Roselind Messier, MD [facility] Pager [phone]   If 7PM-7AM, please contact night-coverage [email or web address] Password [facility] 06/14/2016, 9:34 AM

## 2016-06-14 NOTE — Progress Notes (Signed)
Patient ID: [name], female   DOB: March 25, 1941, 75 y.o.   MRN: [id number]   SUBJECTIVE: Patient was admitted with increased dyspnea and noted to be in atrial fibrillation.  She had DCCV on 11/19 and remains in NSR today.  She seems to have diuresed well yesterday by her report but I/Os are not recorded and no weight yet. She is incontinent.   Scheduled Meds: . calcium-vitamin D  2 tablet Oral Q breakfast  . ferrous sulfate  325 mg Oral BID WC  . furosemide  80 mg Intravenous BID  . isosorbide mononitrate  90 mg Oral Daily  . lisinopril  2.5 mg Oral Daily  . metoprolol  50 mg Oral BID  . pantoprazole  40 mg Oral Daily  . potassium chloride  40 mEq Oral Daily  . rivaroxaban  20 mg Oral Q supper  . sertraline  100 mg Oral QHS  . simvastatin  60 mg Oral q1800  . spironolactone  25 mg Oral Daily   Continuous Infusions: . amiodarone 30 mg/hr (06/14/16 0344)   PRN Meds:.    Vitals:   06/13/16 1141 06/13/16 2007 06/13/16 2300 06/14/16 0351  BP: (!) 130/101 105/67 91/81 (!) 109/51  Pulse:  73 72 (!) 57  Resp:  (!) 21 20 (!) 22  Temp: 98.1 F (36.7 C) 98.9 F (37.2 C) 97.9 F (36.6 C) 97.7 F (36.5 C)  TempSrc: Oral Axillary Oral Oral  SpO2:  99% 98% 99%  Weight:      Height:        Intake/Output Summary (Last 24 hours) at 06/14/16 0750 Last data filed at 06/14/16 0600  Gross per 24 hour  Intake            984.1 ml  Output              300 ml  Net            684.1 ml    LABS: Basic Metabolic Panel:  Recent Labs  58/59/29 2157  06/13/16 0237 06/14/16 0253  NA  --   < > 138 141  K  --   < > 4.5 3.6  CL  --   < > 98* 98*  CO2  --   < > 33* 35*  GLUCOSE  --   < > 118* 112*  BUN  --   < > 13 11  CREATININE  --   < > 1.08* 1.03*  CALCIUM  --   < > 9.5 9.3  MG 2.1  --   --   --   < > = values in this interval not displayed. Liver Function Tests:  Recent Labs  06/14/16 0253  AST 13*  ALT 10*  ALKPHOS 56  BILITOT 0.5  PROT 6.5  ALBUMIN 3.2*   No results  for input(s): LIPASE, AMYLASE in the last 72 hours. CBC:  Recent Labs  06/11/16 1442 06/13/16 0933 06/14/16 0253  WBC 6.5 7.4 9.6  NEUTROABS 4.0  --   --   HGB 8.5* 7.6* 7.6*  HCT 28.9* 27.2* 26.8*  MCV 80.7 82.4 82.5  PLT 336 303 310   Cardiac Enzymes:  Recent Labs  06/11/16 2157 06/12/16 0415 06/12/16 0947  TROPONINI 0.06* 0.05* 0.04*   BNP: Invalid input(s): POCBNP D-Dimer:  Recent Labs  06/14/16 0253  DDIMER 0.35   Hemoglobin A1C: No results for input(s): HGBA1C in the last 72 hours. Fasting Lipid Panel: No results for input(s): CHOL, HDL, LDLCALC, TRIG,  CHOLHDL, LDLDIRECT in the last 72 hours. Thyroid Function Tests: No results for input(s): TSH, T4TOTAL, T3FREE, THYROIDAB in the last 72 hours.  Invalid input(s): FREET3 Anemia Panel:  Recent Labs  06/11/16 1942 06/14/16 0253  FOLATE  --  13.2  FERRITIN 9*  --   TIBC  --  489*  IRON  --  552*  RETICCTPCT  --  2.7    RADIOLOGY: Dg Chest 2 View  Result Date: 06/11/2016 CLINICAL DATA:  Short of breath for 1 month EXAM: CHEST  2 VIEW COMPARISON:  11/17/2015 FINDINGS: Heart is moderately enlarged. Mild vascular congestion. No interstitial edema. No consolidation or mass. No pneumothorax or pleural effusion. Stable L1 compression fracture. IMPRESSION: Cardiomegaly and mild vascular congestion. Electronically Signed   By: Jolaine Click M.D.   On: 06/11/2016 14:39    PHYSICAL EXAM General: NAD Neck: JVP 10 cm, no thyromegaly or thyroid nodule.  Lungs: Distant breath sounds. CV: Nondisplaced PMI.  Heart regular S1/S2, no S3/S4, 1/6 SEM RUSB. Trace ankle edema.   Abdomen: Soft, nontender, no hepatosplenomegaly, no distention.  Neurologic: Alert and oriented x 3.  Psych: Normal affect. Extremities: No clubbing or cyanosis.   TELEMETRY: Reviewed telemetry pt in NSR  ASSESSMENT AND PLAN: 75 yo with h/o chronic diastolic CHF, valvular heart disease (mitral stenosis), PAF, CAD s/p CABG, severe COPD on  home oxygen, and chronic anemia was admitted with increased dyspnea and atrial fibrillation with RVR.  1. Acute on chronic diastolic CHF/valvular heart disease: TEE in 3/17 showed normal EF, mild to moderate mitral stenosis. Most recent RHC in 7/17 showed near-normal filling pressures.  I saw her about a week ago in the office, volume status was good at that point.  Now does have some volume overload, suspect related to going into atrial fibrillation with RVR.  Now back in NSR.  She seems to have diuresed a lot yesterday per her report but is incontinent.  Has not been weighed yet this morning.  Still with some volume overload.    - Lasix 80 mg IV bid today again, reassess volume status tomorrow. Needs a weight, I/Os will be difficult with incontinence.  - Continue 25 mg spironolactone daily.    2. CAD S/P CABG:  She has potential source of angina from 90% stenosis in distal LCx.  The vessel is small at this point and not amenable to PCI. Mild troponin increase this admission (peak 0.06) likely due to CHF exacerbation.  - Continue statin and metoprolol.  - On Xarelto so no aspirin.  - Imdur 90 daily.  3. Atrial fibrillation: Paroxysmal. CHADSVASC score is 4 (age > 12, female, HTN, CHF).  She was admitted with atrial fibrillation recurrence and CHF exacerbation, now s/p DCCV on 11/19.  She remains in NSR on amiodarone gtt.  - Continue Xarelto/metoprolol.  - Convert to po amiodarone.  Long-term amiodarone is going to be difficult given her baseline lung disease, but with size of her atrium, this is likely going to be the only way to keep her in NSR long-term.  4. Pulmonary HTN: This is primarily pulmonary venous hypertension based on most recent RHC. 5. COPD: Followed by Dr Maple Hudson. PFTs in 2/17 with moderate to severe obstruction.  On 2 liters [place].  This is a major contributor to her dyspnea.  6.  OSA: intolerant CPAP 7.  Mitral stenosis: Rheumatic mitral stenosis.  Mild to moderate mitral stenosis by  TEE in 3/17.  Unable to assess by cath in 3/17 because she had  aortic valve vegetation and the valve was not crossed. Unable to assess by cath in 7/17 because she had transient complete heart block when the LV was entered. Treating medically for now.  Would be high risk for surgery with severe COPD.  However, possibly could be valvuloplasty candidate.  Getting repeat echo.  8. Aortic valve vegetation: Noted on TEE in 3/17 done to assess mitral stenosis.  No fever, blood cultures negative.  May have come from prior dental infection.  ID did not recommend antibiotics.  9. Anemia: Chronic Fe deficiency anemia, hemoglobin 7.6 for the last few days.  Got IV Fe 11/20 with low ferritin.  No overt bleeding, ?slow GI blood loss.  Follow closely, no transfusion unless hgb < 7.  If possible, need to continue anticoagulation with recent DCCV.    Marca AnconaDalton Terry Bolotin 06/14/2016 7:50 AM

## 2016-06-15 ENCOUNTER — Inpatient Hospital Stay (HOSPITAL_COMMUNITY): Payer: Medicare Other

## 2016-06-15 LAB — BASIC METABOLIC PANEL
ANION GAP: 10 (ref 5–15)
BUN: 14 mg/dL (ref 6–20)
CALCIUM: 9.5 mg/dL (ref 8.9–10.3)
CO2: 36 mmol/L — ABNORMAL HIGH (ref 22–32)
CREATININE: 1.01 mg/dL — AB (ref 0.44–1.00)
Chloride: 94 mmol/L — ABNORMAL LOW (ref 101–111)
GFR, EST NON AFRICAN AMERICAN: 53 mL/min — AB (ref >60)
Glucose, Bld: 98 mg/dL (ref 65–99)
Potassium: 3.4 mmol/L — ABNORMAL LOW (ref 3.5–5.1)
SODIUM: 140 mmol/L (ref 135–145)

## 2016-06-15 LAB — CBC
HCT: 29.2 % — ABNORMAL LOW (ref 36.0–46.0)
HEMOGLOBIN: 8.6 g/dL — AB (ref 12.0–15.0)
MCH: 24.1 pg — ABNORMAL LOW (ref 26.0–34.0)
MCHC: 29.5 g/dL — ABNORMAL LOW (ref 30.0–36.0)
MCV: 81.8 fL (ref 78.0–100.0)
PLATELETS: 289 10*3/uL (ref 150–400)
RBC: 3.57 MIL/uL — AB (ref 3.87–5.11)
RDW: 16.7 % — ABNORMAL HIGH (ref 11.5–15.5)
WBC: 9.1 10*3/uL (ref 4.0–10.5)

## 2016-06-15 LAB — TYPE AND SCREEN
ABO/RH(D): O POS
Antibody Screen: NEGATIVE
Unit division: 0

## 2016-06-15 LAB — OCCULT BLOOD X 1 CARD TO LAB, STOOL: FECAL OCCULT BLD: NEGATIVE

## 2016-06-15 MED ORDER — SODIUM CHLORIDE 0.9% FLUSH
10.0000 mL | Freq: Two times a day (BID) | INTRAVENOUS | Status: DC
  Administered 2016-06-15 – 2016-06-18 (×7): 10 mL

## 2016-06-15 MED ORDER — ACETAMINOPHEN 325 MG PO TABS
650.0000 mg | ORAL_TABLET | Freq: Four times a day (QID) | ORAL | Status: DC | PRN
  Administered 2016-06-15 – 2016-06-16 (×2): 650 mg via ORAL
  Filled 2016-06-15 (×2): qty 2

## 2016-06-15 MED ORDER — SODIUM CHLORIDE 0.9% FLUSH
10.0000 mL | INTRAVENOUS | Status: DC | PRN
  Administered 2016-06-17: 10 mL
  Filled 2016-06-15: qty 40

## 2016-06-15 MED ORDER — POTASSIUM CHLORIDE CRYS ER 20 MEQ PO TBCR
40.0000 meq | EXTENDED_RELEASE_TABLET | Freq: Once | ORAL | Status: AC
  Administered 2016-06-15: 40 meq via ORAL
  Filled 2016-06-15: qty 2

## 2016-06-15 MED ORDER — METOLAZONE 2.5 MG PO TABS
2.5000 mg | ORAL_TABLET | Freq: Once | ORAL | Status: AC
  Administered 2016-06-15: 2.5 mg via ORAL
  Filled 2016-06-15: qty 1

## 2016-06-15 NOTE — Progress Notes (Signed)
Occupational Therapy Treatment Patient Details Name: [name] MRN: [id number] DOB: [birth year] Today's Date: 06/15/2016    History of present illness PT is a 75 yo female admitted with SOB. Found to be in Afib adn underwent a cardioversion.  Pt also in CHF.  Pt with h/o COPD, CABG, CAD, pulmonary HTN, fall 2 years ago with fx of tail bone.  Pt with EF of 60%.   OT comments  Pt making progress with functional goals. Pt's BP 130/53 and O2 levels remianed >90% during acitivty, however pt visibly SOB and required cues to slow her pace, cease talking and for deep breathing. RN IV team in to place PICC line and assisted pt back to bed for RNs. OT will continue to follow  Follow Up Recommendations  SNF;Supervision/Assistance - 24 hour    Equipment Recommendations  None recommended by OT    Recommendations for Other Services      Precautions / Restrictions Precautions Precautions: Fall Precaution Comments: Pt fell two years ago and feels like things have gone down hill ever since. Restrictions Weight Bearing Restrictions: No Other Position/Activity Restrictions: Monitor BP       Mobility Bed Mobility               General bed mobility comments: pt in chair on arrival  Transfers Overall transfer level: Needs assistance Equipment used: Rolling walker (2 wheeled) Transfers: Sit to/from Stand Sit to Stand: Supervision         General transfer comment: BP 130/53. Pt's O2 levels remianed >90% during acitivty, however pt visibly SOB and required cues to slow her pace, cease talking and for deep breathing    Balance Overall balance assessment: Independent   Sitting balance-Leahy Scale: Good       Standing balance-Leahy Scale: Fair                     ADL Overall ADL's : Needs assistance/impaired     Grooming: Wash/dry face;Wash/dry hands;Supervision/safety;Standing;Cueing for compensatory techniques Grooming Details (indicate cue type and reason): cues for  deep breathing, cease talking and to pace herself                 Toilet Transfer: Supervision/safety;BSC;RW;Ambulation Statistician Details (indicate cue type and reason): cues for deep breathing, cease talking and to pace herself Toileting- Architect and Hygiene: Supervision/safety;Sitting/lateral lean       Functional mobility during ADLs: Rolling walker;Supervision/safety General ADL Comments: BP 130/53. Pt's O2 levels remianed >90% during acitivty, however pt visibly SOB and required cues to slow her pace, cease talking and for deep breathing                                      Cognition   Behavior During Therapy: [facility] for tasks assessed/performed Overall Cognitive Status: Within Functional Limits for tasks assessed                                     Exercises Other Exercises Other Exercises: Pt verbalized 3 energy conservation techniques she can use/uses at home with DME and A/E Other Exercises: Educated pt on additonal enery conservation techniques and AD for home use for washing hair, light meal prep, etc...          General Comments  Pt very pleasant and cooperative  Pertinent Vitals/ Pain       Pain Assessment: No/denies pain                                                          Frequency  Min 2X/week        Progress Toward Goals  OT Goals(current goals can now be found in the care plan section)  Progress towards OT goals: Progressing toward goals     Plan Discharge plan remains appropriate                     End of Session Equipment Utilized During Treatment: Rolling walker;Oxygen   Activity Tolerance Patient limited by fatigue;Other (comment) (SOB)   Patient Left with call bell/phone within reach;in bed;with nursing/sitter in room (IV RNs there to place PICC)   Nurse Communication          Time: [id number] OT Time Calculation (min): 30 min  Charges:  OT General Charges $OT Visit: 1 Procedure OT Treatments $Self Care/Home Management : 8-22 mins $Therapeutic Activity: 8-22 mins  Galen ManilaSpencer, Arath Kaigler Jeanette 06/15/2016, 12:35 PM

## 2016-06-15 NOTE — Progress Notes (Cosign Needed)
Peripherally Inserted Central Catheter/Midline Placement  The IV Nurse has discussed with the patient and/or persons authorized to consent for the patient, the purpose of this procedure and the potential benefits and risks involved with this procedure.  The benefits include less needle sticks, lab draws from the catheter, and the patient may be discharged home with the catheter. Risks include, but not limited to, infection, bleeding, blood clot (thrombus formation), and puncture of an artery; nerve damage and irregular heartbeat and possibility to perform a PICC exchange if needed/ordered by physician.  Alternatives to this procedure were also discussed.  B[employer] Power PICC patient education guide, fact sheet on infection prevention and patient information card has been provided to patient /or left at bedside.    PICC/Midline Placement Documentation        [name] 06/15/2016, 11:01 AM

## 2016-06-15 NOTE — Progress Notes (Addendum)
Patient ID: Monica GoresJoyce R Fuston, female   DOB: 12/03/1940, 75 y.o.   MRN: [id number]   SUBJECTIVE: Patient was admitted with increased dyspnea and noted to be in atrial fibrillation.  She had DCCV on 11/19 and remains in NSR today.  She has diuresed some but difficult to quantify due to incontinence.  Weight is up, she says it has been done standing.  She is still short of breath with any exertion.    Got 1 unit pRBCs 11/21 with appropriate increase in hgb.   Echo this admission: EF 55-60%, mild LVH, mild aortic stenosis and regurgitation, rheumatic-appearing MV with mild MR, mild to moderate MS (consistent with prior) => MVA 1.58 cm^2 with mean gradient 8 mmHg. PASP 68 mmHg.   Scheduled Meds: . amiodarone  400 mg Oral BID  . calcium-vitamin D  2 tablet Oral Q breakfast  . ferrous sulfate  325 mg Oral BID WC  . furosemide  80 mg Intravenous BID  . isosorbide mononitrate  90 mg Oral Daily  . lisinopril  2.5 mg Oral Daily  . metolazone  2.5 mg Oral Once  . metoprolol  50 mg Oral BID  . pantoprazole  40 mg Oral Daily  . potassium chloride  40 mEq Oral Daily  . potassium chloride  40 mEq Oral Once  . rivaroxaban  20 mg Oral Q supper  . sertraline  100 mg Oral QHS  . simvastatin  60 mg Oral q1800  . spironolactone  25 mg Oral Daily   Continuous Infusions:  PRN Meds:.    Vitals:   06/15/16 0005 06/15/16 0359 06/15/16 0630 06/15/16 0800  BP: (!) 137/52 113/68    Pulse:      Resp:      Temp: 98.1 F (36.7 C) 98.1 F (36.7 C)  98 F (36.7 C)  TempSrc: Oral Oral  Axillary  SpO2:      Weight:   182 lb (82.6 kg)   Height:        Intake/Output Summary (Last 24 hours) at 06/15/16 0842 Last data filed at 06/15/16 0630  Gross per 24 hour  Intake             1148 ml  Output             1800 ml  Net             -652 ml    LABS: Basic Metabolic Panel:  Recent Labs  40/98/1111/21/17 0253 06/14/16 0820  NA 141 140  K 3.6 3.6  CL 98* 100*  CO2 35* 30  GLUCOSE 112* 93  BUN 11 10    CREATININE 1.03* 0.96  CALCIUM 9.3 9.3   Liver Function Tests:  Recent Labs  06/14/16 0253  AST 13*  ALT 10*  ALKPHOS 56  BILITOT 0.5  PROT 6.5  ALBUMIN 3.2*   No results for input(s): LIPASE, AMYLASE in the last 72 hours. CBC:  Recent Labs  06/14/16 0253 06/14/16 2216 06/15/16 0302  WBC 9.6  --  9.1  HGB 7.6* 8.6* 8.6*  HCT 26.8* 29.6* 29.2*  MCV 82.5  --  81.8  PLT 310  --  289   Cardiac Enzymes:  Recent Labs  06/12/16 0947  TROPONINI 0.04*   BNP: Invalid input(s): POCBNP D-Dimer:  Recent Labs  06/14/16 0253  DDIMER 0.35   Hemoglobin A1C: No results for input(s): HGBA1C in the last 72 hours. Fasting Lipid Panel: No results for input(s): CHOL, HDL, LDLCALC, TRIG, CHOLHDL, LDLDIRECT in  the last 72 hours. Thyroid Function Tests: No results for input(s): TSH, T4TOTAL, T3FREE, THYROIDAB in the last 72 hours.  Invalid input(s): FREET3 Anemia Panel:  Recent Labs  06/14/16 0253  VITAMINB12 515  FOLATE 13.2  TIBC 489*  IRON 552*  RETICCTPCT 2.7    RADIOLOGY: Dg Chest 2 View  Result Date: 06/11/2016 CLINICAL DATA:  Short of breath for 1 month EXAM: CHEST  2 VIEW COMPARISON:  11/17/2015 FINDINGS: Heart is moderately enlarged. Mild vascular congestion. No interstitial edema. No consolidation or mass. No pneumothorax or pleural effusion. Stable L1 compression fracture. IMPRESSION: Cardiomegaly and mild vascular congestion. Electronically Signed   By: Jolaine Click M.D.   On: 06/11/2016 14:39    PHYSICAL EXAM General: NAD Neck: JVP ?10-12 cm, no thyromegaly or thyroid nodule.  Lungs: Distant breath sounds. CV: Nondisplaced PMI.  Heart regular S1/S2, no S3/S4, 2/6 SEM RUSB. 1+ ankle edema.   Abdomen: Soft, nontender, no hepatosplenomegaly, no distention.  Neurologic: Alert and oriented x 3.  Psych: Normal affect. Extremities: No clubbing or cyanosis.   TELEMETRY: Reviewed telemetry pt in NSR  ASSESSMENT AND PLAN: 75 yo with h/o chronic  diastolic CHF, valvular heart disease (mitral stenosis), PAF, CAD s/p CABG, severe COPD on home oxygen, and chronic anemia was admitted with increased dyspnea and atrial fibrillation with RVR.  1. Acute on chronic diastolic CHF/valvular heart disease: TEE in 3/17 showed normal EF, mild to moderate mitral stenosis. Most recent RHC in 7/17 showed near-normal filling pressures.  Echo this admission was similar to prior with normal EF, mild to moderate MS, elevated PA pressure.  I saw her about a week ago in the office, volume status was good at that point.  Now does have some volume overload, suspect related to going into atrial fibrillation with RVR.  Now back in NSR.  Still short of breath with any exertion.  Exam difficult but probably volume overloaded.  Weight actually higher, seems to have been weighed standing.  I/Os inaccurate as incontinent. COPD is likely also playing a role in her dyspnea.  - Lasix 80 mg IV bid today again, will give dose of metolazone 2.5 x 1.  - Place PICC line to monitor CVP to help with volume status.   - Continue 25 mg spironolactone daily.    2. CAD S/P CABG:  She has potential source of angina from 90% stenosis in distal LCx.  The vessel is small at this point and not amenable to PCI. Mild troponin increase this admission (peak 0.06) likely due to CHF exacerbation.  - Continue statin and metoprolol.  - On Xarelto so no aspirin.  - Imdur 90 daily.  3. Atrial fibrillation: Paroxysmal. CHADSVASC score is 4 (age > 67, female, HTN, CHF).  She was admitted with atrial fibrillation recurrence and CHF exacerbation, now s/p DCCV on 11/19.  She remains in NSR on amiodarone gtt.  - Continue Xarelto/metoprolol.  - Converted to po amiodarone.  Long-term amiodarone is going to be difficult given her baseline lung disease, but with size of her atrium, this is likely going to be the only way to keep her in NSR long-term.  4. Pulmonary HTN: This is primarily pulmonary venous hypertension  based on most recent RHC. 5. COPD: Followed by Dr Maple Hudson. PFTs in 2/17 with moderate to severe obstruction.  On 2 liters [place].  This is a major contributor to her dyspnea.  6.  OSA: intolerant CPAP 7.  Mitral stenosis: Rheumatic mitral stenosis.  Mild to moderate mitral  stenosis by TEE in 3/17 and again by TTE this admission.  Unable to assess by cath in 3/17 because she had aortic valve vegetation and the valve was not crossed. Unable to assess by cath in 7/17 because she had transient complete heart block when the LV was entered. Treating medically for now.  Would be high risk for surgery with severe COPD.  However, possibly could be valvuloplasty candidate down the road. 8. Aortic valve vegetation: Noted on TEE in 3/17 done to assess mitral stenosis.  No fever, blood cultures negative.  May have come from prior dental infection.  ID did not recommend antibiotics.  9. Anemia: Chronic Fe deficiency anemia, hemoglobin down to 7.6.  Got IV Fe 11/20 with low ferritin.  No overt bleeding, ?slow GI blood loss.  She was given 1 unit PRBCs 11/21 with appropriate increase in hgb.  If possible, need to continue anticoagulation with recent DCCV.    Marca Ancona 06/15/2016 8:42 AM  CVP 7.  Would switch back to home po diuretic regimen tomorrow.   Marca Ancona 06/15/2016 4:51 PM

## 2016-06-15 NOTE — Progress Notes (Signed)
Physical Therapy Treatment Patient Details Name: [name] MRN: [id number] DOB: 1940-09-20 Today's Date: 06/15/2016    History of Present Illness PT is a 75 yo female admitted with SOB. Found to be in Afib adn underwent a cardioversion.  Pt also in CHF.  Pt with h/o COPD, CABG, CAD, pulmonary HTN, fall 2 years ago with fx of tail bone.  Pt with EF of 60%.    PT Comments    Pt's dyspnea with activity still present but improved since last visit. Pt also with incr activity tolerance.  Follow Up Recommendations  Home health PT     Equipment Recommendations  None recommended by PT    Recommendations for Other Services       Precautions / Restrictions Precautions Precautions: Fall Precaution Comments: Pt fell two years ago and feels like things have gone down hill ever since. Restrictions Weight Bearing Restrictions: No Other Position/Activity Restrictions: Monitor BP    Mobility  Bed Mobility Overal bed mobility: Needs Assistance Bed Mobility: Supine to Sit     Supine to sit: Supervision;HOB elevated     General bed mobility comments: Incr time  Transfers Overall transfer level: Needs assistance Equipment used: Rolling walker (2 wheeled) Transfers: Sit to/from Stand Sit to Stand: Supervision;Min assist         General transfer comment: supervision from chair or rollator and min A from low toilet.  Ambulation/Gait Ambulation/Gait assistance: Supervision Ambulation Distance (Feet): 100 Feet (x 2) Assistive device: 4-wheeled walker Gait Pattern/deviations: Step-through pattern;Decreased step length - right;Decreased step length - left;Trunk flexed Gait velocity: decr Gait velocity interpretation: Below normal speed for age/gender General Gait Details: Pt amb on 2L of O2 with dyspnea 2/4. Sat on rollator for 3-4 minutes after 100'. Stopped in restroom when returning to room.    Stairs            Wheelchair Mobility    Modified Rankin (Stroke  Patients Only)       Balance Overall balance assessment: Needs assistance Sitting-balance support: No upper extremity supported Sitting balance-Leahy Scale: Normal     Standing balance support: No upper extremity supported Standing balance-Leahy Scale: Fair                      Cognition Arousal/Alertness: Awake/alert Behavior During Therapy: WFL for tasks assessed/performed Overall Cognitive Status: Within Functional Limits for tasks assessed                      Exercises Other Exercises Other Exercises: Pt verbalized 3 energy conservation techniques she can use/uses at home with DME and A/E Other Exercises: Educated pt on additonal enery conservation techniques and AD for home use for washing hair, light meal prep, etc...    General Comments        Pertinent Vitals/Pain Pain Assessment: No/denies pain    Home Living                      Prior Function            PT Goals (current goals can now be found in the care plan section) Progress towards PT goals: Progressing toward goals    Frequency    Min 3X/week      PT Plan Discharge plan needs to be updated    Co-evaluation             End of Session Equipment Utilized During Treatment: Oxygen Activity Tolerance: Patient limited by  fatigue Patient left: in chair;with call bell/phone within reach     Time: 1222-1305 PT Time Calculation (min) (ACUTE ONLY): 43 min  Charges:  $Gait Training: 38-52 mins                    G Codes:      Angelina OkCary W Taylor [facility] Facility[facility]cok 06/15/2016, 4:17 PM Fluor CorporationCary Katalia Choma PT [phone]

## 2016-06-15 NOTE — Plan of Care (Signed)
Problem: Safety: Goal: Ability to remain free from injury will improve Outcome: Progressing Patient calls when she needs to get up. Call bell always within reach. Front wheel walker used with ambulation.  Problem: Skin Integrity: Goal: Risk for impaired skin integrity will decrease Outcome: Progressing Patient encouraged to reposition self in chair/ bed.  Problem: Activity: Goal: Risk for activity intolerance will decrease Outcome: Progressing Patient has been working with PT to get up and walk in hallway. Patient ambulated to nurses station today and back to room. Also able to get up to chair for several hours and to bathroom x2.  Problem: Fluid Volume: Goal: Ability to maintain a balanced intake and output will improve Outcome: Progressing Due to concern with I and O. Patient had PICC line placed and CVP's monitored.   Problem: Bowel/Gastric: Goal: Will not experience complications related to bowel motility Outcome: Progressing Patient able to have a large bowel movement today. It was tested for occult blood and was negative.

## 2016-06-15 NOTE — Progress Notes (Signed)
[place] TEAM 1 - Stepdown/ICU TEAM  [name]  ZOX:[id number] DOB: 01/08/1941 DOA: 06/11/2016 PCP: Astrid Divine, MD    Brief Narrative:  75 y.o. female with a Hx of severe COPD, CAD status post CABG, GERD, rheumatic heart disease, and pulmonary hypertension who was admitted 06/11/16 for worsening shortness of breath and chest pain. Patient had a heart catheterization 7/17 which showed patent bypass grafts and a 90% stenosis in the distal left circumflex that was not amenable to PCI. EF 60% by echocardiogram 3/17. This study also showed a vegetation on the aortic valve, rheumatic mitral stenosis, and a small PFO. PFTs done 2/17 showed moderate-severe obstructive airway disease. A sleep study was negative for OSA. Upon initial evaluation in the ED, patient's BNP was elevated and she was found to be in atrial fibrillation. Cardiology was consulted.  Subjective: The patient is sitting up in a bedside chair.  She is cold and also complains of severe ongoing dyspnea.  She feels that this has been steadily worsening for many months now but has come to the point that she can barely even walk across the room without being severely short of breath.  She denies chest pain nausea vomiting or abdominal pain.  Assessment & Plan:  Acute on chronic diastolic heart failure Continue with attempts to diurese - exacerbation felt to be due to RVR - baseline wgt appears to be 81-82kg - Is/Os not accurate due to incontinence  Filed Weights   06/13/16 0500 06/14/16 0800 06/15/16 0630  Weight: 81.1 kg (178 lb 14.4 oz) 82.6 kg (182 lb) 82.6 kg (182 lb)    Paroxysmal atrial fibrillation w/ RVR CHA2DS2 - VASc is 4 - s/p cardioversion this hospitalization - on Xarelto chronically - Cards following - maintaining sinus rhythm for now  Normocytic anemia iron deficient - status post iron load and transfusion of one unit PRBC - need to consider GI evaluation but on chronic anti-coagulation - this will need  to be coordinated in the outpatient setting once it is safe to hold blood thinners (not currently as pt post DCCV)  CAD s/p CABG Catheterization 7/17 showed patent bypass grafts with a 90% stenosis in the distal left circumflex not amenable to PCI. Continue nitrates. Mild troponin elevation likely from demand ischemia in the setting of atrial fibrillation.  Pulmonary venous hypertension  Per [facility] - Cardiology following   Acute on chronic respiratory failure with hypoxia  Due to above - saturations presently stable on 2 L nasal cannula while at rest  COPD Followed by Dr. 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 rate (!) 22, height 5\' 6"  (1.676 m), weight 82.6 kg (182 lb), SpO2 97 %.  Intake/Output Summary (Last 24 hours) at 06/15/16 1700 Last data filed at 06/15/16 1404  Gross per 24 hour  Intake             1038 ml  Output             1800 ml  Net             -762 ml   Filed Weights   06/13/16 0500 06/14/16 0800  06/15/16 0630  Weight: 81.1 kg (178 lb 14.4 oz) 82.6 kg (182 lb) 82.6 kg (182 lb)    Examination: General: No acute respiratory  Lungs: Distant breath sounds throughout all fields - no wheeze Cardiovascular: Regular rate without murmur Abdomen: Nontender, nondistended, soft, bowel sounds positive, no rebound, no ascites, no appreciable mass Extremities: No significant cyanosis or edema bilateral lower extremities  CBC:  Recent Labs Lab 06/11/16 1442 06/13/16 0933 06/14/16 0253 06/14/16 2216 06/15/16 0302  WBC 6.5 7.4 9.6  --  9.1  NEUTROABS 4.0  --   --   --   --   HGB 8.5* 7.6* 7.6* 8.6* 8.6*  HCT 28.9* 27.2* 26.8* 29.6* 29.2*  MCV 80.7 82.4 82.5  --  81.8  PLT 336 303 310  --  289   Basic Metabolic Panel:  Recent Labs Lab 06/11/16 2157 06/12/16 0415 06/13/16 0237 06/14/16 0253 06/14/16 0820 06/15/16 1230  NA  --  140 138 141 140 140  K  --  4.1 4.5 3.6 3.6 3.4*  CL  --  97* 98* 98* 100* 94*  CO2  --  34* 33* 35* 30 36*  GLUCOSE  --  108* 118* 112* 93 98  BUN  --  13 13 11 10 14   CREATININE  --  1.19* 1.08* 1.03* 0.96 1.01*  CALCIUM  --  9.3 9.5 9.3 9.3 9.5  MG 2.1  --   --   --   --   --    GFR: Estimated Creatinine Clearance: 51.3 mL/min (by C-G formula based on SCr of 1.01 mg/dL (H)).  Liver Function Tests:  Recent Labs Lab 06/14/16 0253  AST 13*  ALT 10*  ALKPHOS 56  BILITOT 0.5  PROT 6.5  ALBUMIN 3.2*   Cardiac Enzymes:  Recent Labs Lab 06/11/16 1442 06/11/16 2157 06/12/16 0415 06/12/16 0947  TROPONINI 0.07* 0.06* 0.05* 0.04*    Recent Results (from the past 240 hour(s))  MRSA PCR Screening     Status: None   Collection Time: 06/11/16  9:33 PM  Result Value Ref Range Status   MRSA by PCR NEGATIVE NEGATIVE Final    Comment:        The GeneXpert MRSA Assay (FDA approved for NASAL specimens only), is one component of a comprehensive MRSA colonization surveillance program. It is not intended to diagnose MRSA infection nor to guide  or monitor treatment for MRSA infections.      Scheduled Meds: . amiodarone  400 mg Oral BID  . calcium-vitamin D  2 tablet Oral Q breakfast  . ferrous sulfate  325 mg Oral BID WC  . furosemide  80 mg Intravenous BID  . isosorbide mononitrate  90 mg Oral Daily  . lisinopril  2.5 mg Oral Daily  . metoprolol  50 mg Oral BID  . pantoprazole  40 mg Oral Daily  . potassium chloride  40 mEq Oral Daily  . rivaroxaban  20 mg Oral Q supper  . sertraline  100 mg Oral QHS  . simvastatin  60 mg Oral q1800  . sodium chloride flush  10-40 mL Intracatheter Q12H  . spironolactone  25 mg Oral Daily      LOS: 4 days   Lonia BloodJeffrey T. Karna Abed, MD [facility] Office  [phone] Pager - Text Page per Amion as per below:  On-Call/Text Page:      [email or web address]      password [email or web address]  If 7PM-7AM, please contact night-coverage [email or web address] Password [email or web address] 06/15/2016, 5:00 PM

## 2016-06-16 DIAGNOSIS — N179 Acute kidney failure, unspecified: Secondary | ICD-10-CM

## 2016-06-16 LAB — CBC
HEMATOCRIT: 31.2 % — AB (ref 36.0–46.0)
HEMOGLOBIN: 9 g/dL — AB (ref 12.0–15.0)
MCH: 23.7 pg — AB (ref 26.0–34.0)
MCHC: 28.8 g/dL — AB (ref 30.0–36.0)
MCV: 82.1 fL (ref 78.0–100.0)
Platelets: 297 10*3/uL (ref 150–400)
RBC: 3.8 MIL/uL — ABNORMAL LOW (ref 3.87–5.11)
RDW: 16.8 % — AB (ref 11.5–15.5)
WBC: 9.1 10*3/uL (ref 4.0–10.5)

## 2016-06-16 LAB — BASIC METABOLIC PANEL
Anion gap: 8 (ref 5–15)
BUN: 21 mg/dL — AB (ref 6–20)
CALCIUM: 9.7 mg/dL (ref 8.9–10.3)
CO2: 37 mmol/L — AB (ref 22–32)
Chloride: 94 mmol/L — ABNORMAL LOW (ref 101–111)
Creatinine, Ser: 1.23 mg/dL — ABNORMAL HIGH (ref 0.44–1.00)
GFR calc Af Amer: 48 mL/min — ABNORMAL LOW (ref >60)
GFR, EST NON AFRICAN AMERICAN: 42 mL/min — AB (ref >60)
GLUCOSE: 95 mg/dL (ref 65–99)
POTASSIUM: 4.1 mmol/L (ref 3.5–5.1)
Sodium: 139 mmol/L (ref 135–145)

## 2016-06-16 MED ORDER — ISOSORBIDE MONONITRATE ER 30 MG PO TB24
60.0000 mg | ORAL_TABLET | Freq: Every day | ORAL | Status: DC
  Administered 2016-06-17 – 2016-06-18 (×2): 60 mg via ORAL
  Filled 2016-06-16 (×2): qty 2

## 2016-06-16 MED ORDER — METOPROLOL TARTRATE 50 MG PO TABS
50.0000 mg | ORAL_TABLET | Freq: Two times a day (BID) | ORAL | Status: DC
Start: 2016-06-16 — End: 2016-06-18
  Administered 2016-06-17 – 2016-06-18 (×3): 50 mg via ORAL
  Filled 2016-06-16 (×4): qty 1

## 2016-06-16 NOTE — Progress Notes (Signed)
[place] TEAM 1 - Stepdown/ICU TEAM  [name]  WYO:[id number] DOB: 1941/06/07 DOA: 06/11/2016 PCP: Astrid Divine, MD    Brief Narrative:  75 y.o. female with a Hx of severe COPD, CAD status post CABG, GERD, rheumatic heart disease, and pulmonary hypertension who was admitted 06/11/16 for worsening shortness of breath and chest pain. Patient had a heart catheterization 7/17 which showed patent bypass grafts and a 90% stenosis in the distal left circumflex that was not amenable to PCI. EF 60% by echocardiogram 3/17. This study also showed a vegetation on the aortic valve, rheumatic mitral stenosis, and a small PFO. PFTs done 2/17 showed moderate-severe obstructive airway disease. A sleep study was negative for OSA. Upon initial evaluation in the ED, patient's BNP was elevated and she was found to be in atrial fibrillation. Cardiology was consulted.  Subjective: The patient is sitting up in a bedside chair.  She denies dizziness or lightheadedness.  She does report significant thirst.  She also has noted modest lower extremity edema.  She denies chest pain cough or wheezing.  Assessment & Plan:  Acute on chronic diastolic heart failure exacerbation felt to be due to RVR - baseline wgt appears to be 81-82kg - Is/Os not accurate due to incontinence - weight trending down - follow renal fxn - BP dipping low so will have to hold on further diuresis for now - attempt to resume home demadex in AM   [facility] Weights   06/14/16 0800 06/15/16 0630 06/16/16 0242  Weight: 82.6 kg (182 lb) 82.6 kg (182 lb) 81.9 kg (180 lb 8 oz)    Paroxysmal atrial fibrillation w/ RVR CHA2DS2 - VASc is 4 - s/p cardioversion this hospitalization - on Xarelto chronically - Cards following - maintaining sinus rhythm for now  CAD s/p CABG - 90% stenosis L circ  Catheterization 7/17 showed patent bypass grafts with a 90% stenosis in the distal left circumflex not amenable to PCI. Continue nitrates. Mild troponin  elevation likely from demand ischemia in the setting of atrial fibrillation.  Normocytic anemia iron deficient - status post iron load and transfusion of one unit PRBC - need to consider GI evaluation but on chronic anti-coagulation - this will need to be coordinated in the outpatient setting once it is safe to hold blood thinners (not currently as pt post DCCV)  Pulmonary venous hypertension  Per [facility] - Cardiology following   OSA Unable to tolerate CPAP  COPD Followed by Dr. Maple Hudson as an outpatient  Acute on chronic respiratory failure with hypoxia  Due to above - saturations presently stable on 2 L nasal cannula while at rest   Multifactorial Dyspnea on Exertion  Due to all above issues - will be difficult to correct to signif extent - beginning to educate pt   HTN  Blood pressure currently modestly low but being tolerated - hold further diuresis for today  Dyslipidemia Continue Zocor  Hypokalemia Replaced  Acute kidney injury  Creatinine slightly elevated today coincident with increased diuresis - hold diuretic today and recheck in a.m.  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 rate (!) 22, height 5\' 6"  (1.676 m), weight 81.9 kg (180 lb 8 oz), SpO2 (!) 76 %.  Intake/Output Summary (Last 24 hours) at 06/16/16 1106 Last data filed at  06/16/16 1040  Gross per 24 hour  Intake             1350 ml  Output             1000 ml  Net              350 ml   Filed Weights   06/14/16 0800 06/15/16 0630 06/16/16 0242  Weight: 82.6 kg (182 lb) 82.6 kg (182 lb) 81.9 kg (180 lb 8 oz)    Examination: General: No acute respiratory distress at rest Lungs: No wheezing or crackles Cardiovascular: Regular rate without murmur or gallop Abdomen: Nontender, nondistended, soft, bowel sounds positive, no rebound Extremities: 1+ bilateral lower extremity edema  CBC:  Recent Labs Lab 06/11/16 1442 06/13/16 0933 06/14/16 0253 06/14/16 2216 06/15/16 0302 06/16/16 0305  WBC 6.5 7.4 9.6  --  9.1 9.1  NEUTROABS 4.0  --   --   --   --   --   HGB 8.5* 7.6* 7.6* 8.6* 8.6* 9.0*  HCT 28.9* 27.2* 26.8* 29.6* 29.2* 31.2*  MCV 80.7 82.4 82.5  --  81.8 82.1  PLT 336 303 310  --  289 297   Basic Metabolic Panel:  Recent Labs Lab 06/11/16 2157  06/13/16 0237 06/14/16 0253 06/14/16 0820 06/15/16 1230 06/16/16 0305  NA  --   < > 138 141 140 140 139  K  --   < > 4.5 3.6 3.6 3.4* 4.1  CL  --   < > 98* 98* 100* 94* 94*  CO2  --   < > 33* 35* 30 36* 37*  GLUCOSE  --   < > 118* 112* 93 98 95  BUN  --   < > 13 11 10 14  21*  CREATININE  --   < > 1.08* 1.03* 0.96 1.01* 1.23*  CALCIUM  --   < > 9.5 9.3 9.3 9.5 9.7  MG 2.1  --   --   --   --   --   --   < > = values in this interval not displayed. GFR: Estimated Creatinine Clearance: 42 mL/min (by C-G formula based on SCr of 1.23 mg/dL (H)).  Liver Function Tests:  Recent Labs Lab 06/14/16 0253  AST 13*  ALT 10*  ALKPHOS 56  BILITOT 0.5  PROT 6.5  ALBUMIN 3.2*   Cardiac Enzymes:  Recent Labs Lab 06/11/16 1442 06/11/16 2157 06/12/16 0415 06/12/16 0947  TROPONINI 0.07* 0.06* 0.05* 0.04*    Recent Results (from the past 240 hour(s))  MRSA PCR Screening     Status: None   Collection Time: 06/11/16  9:33 PM  Result Value Ref Range Status   MRSA by PCR NEGATIVE NEGATIVE  Final    Comment:        The GeneXpert MRSA Assay (FDA approved for NASAL specimens only), is one component of a comprehensive MRSA colonization surveillance program. It is not intended to diagnose MRSA infection nor to guide or monitor treatment for MRSA infections.      Scheduled Meds: . amiodarone  400 mg Oral BID  .  calcium-vitamin D  2 tablet Oral Q breakfast  . ferrous sulfate  325 mg Oral BID WC  . furosemide  80 mg Intravenous BID  . isosorbide mononitrate  90 mg Oral Daily  . lisinopril  2.5 mg Oral Daily  . metoprolol  50 mg Oral BID  . pantoprazole  40 mg Oral Daily  . potassium chloride  40 mEq Oral Daily  . rivaroxaban  20 mg Oral Q supper  . sertraline  100 mg Oral QHS  . simvastatin  60 mg Oral q1800  . sodium chloride flush  10-40 mL Intracatheter Q12H  . spironolactone  25 mg Oral Daily      LOS: 5 days   Lonia Blood, MD [facility] Office  [phone] Pager - Text Page per Amion as per below:  On-Call/Text Page:      [email or web address]      password [email or web address]  If 7PM-7AM, please contact night-coverage [email or web address] Password [email or web address] 06/16/2016, 11:06 AM

## 2016-06-16 NOTE — Progress Notes (Signed)
Patient ID: [name], female   DOB: [birth year], 75 y.o.   MRN: [id number]   SUBJECTIVE: Patient was admitted with increased dyspnea and noted to be in atrial fibrillation.  She had DCCV on 11/19 and remains in NSR today.  She has diuresed some but difficult to quantify due to incontinence.   Got metolazone and IV lasix yesterday and PICC placed. CVP initially was 7. Weight down 2 pounds. CVP 2 this am (measured personally) Cr 1.0->1.2. Diuretics now on hold. Remains dyspneic with any exertion.   Got 1 unit pRBCs 11/21 with appropriate increase in hgb.   Echo this admission: EF 55-60%, mild LVH, mild aortic stenosis and regurgitation, rheumatic-appearing MV with mild MR, mild to moderate MS (consistent with prior) => MVA 1.58 cm^2 with mean gradient 8 mmHg. PASP 68 mmHg.   Scheduled Meds: . amiodarone  400 mg Oral BID  . calcium-vitamin D  2 tablet Oral Q breakfast  . ferrous sulfate  325 mg Oral BID WC  . isosorbide mononitrate  90 mg Oral Daily  . lisinopril  2.5 mg Oral Daily  . metoprolol  50 mg Oral BID  . pantoprazole  40 mg Oral Daily  . potassium chloride  40 mEq Oral Daily  . rivaroxaban  20 mg Oral Q supper  . sertraline  100 mg Oral QHS  . simvastatin  60 mg Oral q1800  . sodium chloride flush  10-40 mL Intracatheter Q12H  . spironolactone  25 mg Oral Daily   Continuous Infusions:  PRN Meds:.    Vitals:   06/16/16 0700 06/16/16 0800 06/16/16 0902 06/16/16 0925  BP: (!) 99/35 (!) 116/56 (!) 117/51   Pulse: (!) 59 (!) 53 68 69  Resp: 20 (!) 21 (!) 22   Temp:   98.1 F (36.7 C)   TempSrc:   Oral   SpO2: 100% 93% (!) 76%   Weight:      Height:        Intake/Output Summary (Last 24 hours) at 06/16/16 1125 Last data filed at 06/16/16 1040  Gross per 24 hour  Intake             1350 ml  Output             1000 ml  Net              350 ml    LABS: Basic Metabolic Panel:  Recent Labs  78/29/56 1230 06/16/16 0305  NA 140 139  K 3.4* 4.1  CL 94* 94*    CO2 36* 37*  GLUCOSE 98 95  BUN 14 21*  CREATININE 1.01* 1.23*  CALCIUM 9.5 9.7   Liver Function Tests:  Recent Labs  06/14/16 0253  AST 13*  ALT 10*  ALKPHOS 56  BILITOT 0.5  PROT 6.5  ALBUMIN 3.2*   No results for input(s): LIPASE, AMYLASE in the last 72 hours. CBC:  Recent Labs  06/15/16 0302 06/16/16 0305  WBC 9.1 9.1  HGB 8.6* 9.0*  HCT 29.2* 31.2*  MCV 81.8 82.1  PLT 289 297   Cardiac Enzymes: No results for input(s): CKTOTAL, CKMB, CKMBINDEX, TROPONINI in the last 72 hours. BNP: Invalid input(s): POCBNP D-Dimer:  Recent Labs  06/14/16 0253  DDIMER 0.35   Hemoglobin A1C: No results for input(s): HGBA1C in the last 72 hours. Fasting Lipid Panel: No results for input(s): CHOL, HDL, LDLCALC, TRIG, CHOLHDL, LDLDIRECT in the last 72 hours. Thyroid Function Tests: No results for input(s): TSH, T4TOTAL,  T3FREE, THYROIDAB in the last 72 hours.  Invalid input(s): FREET3 Anemia Panel:  Recent Labs  06/14/16 0253  VITAMINB12 515  FOLATE 13.2  TIBC 489*  IRON 552*  RETICCTPCT 2.7    RADIOLOGY: Dg Chest 2 View  Result Date: 06/11/2016 CLINICAL DATA:  Short of breath for 1 month EXAM: CHEST  2 VIEW COMPARISON:  11/17/2015 FINDINGS: Heart is moderately enlarged. Mild vascular congestion. No interstitial edema. No consolidation or mass. No pneumothorax or pleural effusion. Stable L1 compression fracture. IMPRESSION: Cardiomegaly and mild vascular congestion. Electronically Signed   By: Jolaine Click M.D.   On: 06/11/2016 14:39   Dg Chest Port 1 View  Result Date: 06/15/2016 CLINICAL DATA:  STAT to confirm placement of right arm PICC line EXAM: PORTABLE CHEST - 1 VIEW COMPARISON:  06/11/2016 FINDINGS: Right arm PICC line extends to the lower SVC. Improvement in the mild pulmonary vascular congestion seen on previous exam. Heart size upper limits normal. Previous CABG. Mild elevation of the left diaphragmatic leaflet. Surgical clips above the thoracic inlet  on the right. Left carotid calcifications. Atheromatous aorta. IMPRESSION: 1. PICC line to lower SVC. Electronically Signed   By: Corlis Leak M.D.   On: 06/15/2016 11:46    PHYSICAL EXAM General: NAD. Sitting in chair Neck:JVP flat , no thyromegaly or thyroid nodule.  Lungs: Distant breath sounds. CV: Nondisplaced PMI.  Heart regular S1/S2, no S3/S4, 2/6 SEM RUSB. No edema Abdomen: Soft, nontender, no hepatosplenomegaly, no distention.  Neurologic: Alert and oriented x 3.  Psych: Normal affect. Extremities: No clubbing or cyanosis.   TELEMETRY: Reviewed telemetry pt in NSR 60s with PVCs  ASSESSMENT AND PLAN: 75 yo with h/o chronic diastolic CHF, valvular heart disease (mitral stenosis), PAF, CAD s/p CABG, severe COPD on home oxygen, and chronic anemia was admitted with increased dyspnea and atrial fibrillation with RVR.  1. Acute on chronic diastolic CHF/valvular heart disease: TEE in 3/17 showed normal EF, mild to moderate mitral stenosis. Most recent RHC in 7/17 showed near-normal filling pressures.  Echo this admission was similar to prior with normal EF, mild to moderate MS, elevated PA pressure.  I saw her about a week ago in the office, volume status was good at that point.  Now does have some volume overload, suspect related to going into atrial fibrillation with RVR.  Now back in NSR.  Still short of breath with any exertion.  - CVP now 2. Continue to hold diuretics. Can restart po in am if renal function stable - Remains dyspneic despite very good decongestion. Dyspnea likely non cardiac at this point - Continue 25 mg spironolactone daily.    2. CAD S/P CABG:  She has potential source of angina from 90% stenosis in distal LCx.  The vessel is small at this point and not amenable to PCI. Mild troponin increase this admission (peak 0.06) likely due to CHF exacerbation.  - Continue statin and metoprolol.  - On Xarelto so no aspirin.  - Imdur 90 daily.  3. Atrial fibrillation: Paroxysmal.  CHADSVASC score is 4 (age > 74, female, HTN, CHF).  She was admitted with atrial fibrillation recurrence and CHF exacerbation, now s/p DCCV on 11/19.  She remains in NSR on amiodarone. - Continue Xarelto/metoprolol.  - now on po amiodarone.  Long-term amiodarone is going to be difficult given her baseline lung disease, but with size of her atrium, this is likely going to be the only way to keep her in NSR long-term.  4. Pulmonary HTN:  This is primarily pulmonary venous hypertension based on most recent RHC. 5. COPD: Followed by Dr Maple HudsonYoung. PFTs in 2/17 with moderate to severe obstruction.  On 2 liters [place].  This is a major contributor to her dyspnea.  6.  OSA: intolerant CPAP 7.  Mitral stenosis: Rheumatic mitral stenosis.  Mild to moderate mitral stenosis by TEE in 3/17 and again by TTE this admission.  Unable to assess by cath in 3/17 because she had aortic valve vegetation and the valve was not crossed. Unable to assess by cath in 7/17 because she had transient complete heart block when the LV was entered. Treating medically for now.  Would be high risk for surgery with severe COPD.  However, possibly could be valvuloplasty candidate down the road. 8. Aortic valve vegetation: Noted on TEE in 3/17 done to assess mitral stenosis.  No fever, blood cultures negative.  May have come from prior dental infection.  ID did not recommend antibiotics.  9. Anemia: Chronic Fe deficiency anemia, hemoglobin down to 7.6.  Got IV Fe 11/20 with low ferritin.  No overt bleeding, ?slow GI blood loss.  She was given 1 unit PRBCs 11/21 with appropriate increase in hgb.   If possible, need to continue anticoagulation with recent DCCV.   --hgb 9.0 today  She is well diuresed. Holding diuretics today. Can restart po tomorrow. Should be ready for d/c soon from our standpoint as she is about as good as we can get her. Needs rehab.   Arvilla MeresBensimhon, Monica Granillo MD 06/16/2016 11:25 AM

## 2016-06-16 NOTE — Plan of Care (Signed)
Problem: Activity: Goal: Risk for activity intolerance will decrease Outcome: Progressing Patient up in room from [facility] to Bed and up in chair for most of day.   Problem: Fluid Volume: Goal: Ability to maintain a balanced intake and output will improve Outcome: Progressing Patient on strict I and O. CVP being monitored by cardiology.

## 2016-06-17 LAB — BASIC METABOLIC PANEL
Anion gap: 10 (ref 5–15)
BUN: 28 mg/dL — AB (ref 6–20)
CALCIUM: 9.5 mg/dL (ref 8.9–10.3)
CHLORIDE: 93 mmol/L — AB (ref 101–111)
CO2: 35 mmol/L — ABNORMAL HIGH (ref 22–32)
CREATININE: 1.81 mg/dL — AB (ref 0.44–1.00)
GFR, EST AFRICAN AMERICAN: 30 mL/min — AB (ref >60)
GFR, EST NON AFRICAN AMERICAN: 26 mL/min — AB (ref >60)
Glucose, Bld: 96 mg/dL (ref 65–99)
Potassium: 3.8 mmol/L (ref 3.5–5.1)
SODIUM: 138 mmol/L (ref 135–145)

## 2016-06-17 LAB — CBC
HCT: 29.9 % — ABNORMAL LOW (ref 36.0–46.0)
Hemoglobin: 8.7 g/dL — ABNORMAL LOW (ref 12.0–15.0)
MCH: 24 pg — ABNORMAL LOW (ref 26.0–34.0)
MCHC: 29.1 g/dL — ABNORMAL LOW (ref 30.0–36.0)
MCV: 82.6 fL (ref 78.0–100.0)
PLATELETS: 266 10*3/uL (ref 150–400)
RBC: 3.62 MIL/uL — AB (ref 3.87–5.11)
RDW: 17.8 % — AB (ref 11.5–15.5)
WBC: 9.1 10*3/uL (ref 4.0–10.5)

## 2016-06-17 MED ORDER — RIVAROXABAN 15 MG PO TABS
15.0000 mg | ORAL_TABLET | Freq: Every day | ORAL | Status: DC
  Administered 2016-06-17: 15 mg via ORAL
  Filled 2016-06-17: qty 1

## 2016-06-17 NOTE — Progress Notes (Signed)
Patient ID: [name], female   DOB: 12-Jan-1941, 75 y.o.   MRN: [id number]   SUBJECTIVE:   Feels great. Off diuretics due to low CVP. Creatinine bumped to 1.8. Denies dyspnea   Echo this admission: EF 55-60%, mild LVH, mild aortic stenosis and regurgitation, rheumatic-appearing MV with mild MR, mild to moderate MS (consistent with prior) => MVA 1.58 cm^2 with mean gradient 8 mmHg. PASP 68 mmHg.   Scheduled Meds: . amiodarone  400 mg Oral BID  . calcium-vitamin D  2 tablet Oral Q breakfast  . ferrous sulfate  325 mg Oral BID WC  . isosorbide mononitrate  60 mg Oral Daily  . lisinopril  2.5 mg Oral Daily  . metoprolol  50 mg Oral BID  . pantoprazole  40 mg Oral Daily  . rivaroxaban  15 mg Oral Q supper  . sertraline  100 mg Oral QHS  . simvastatin  60 mg Oral q1800  . sodium chloride flush  10-40 mL Intracatheter Q12H  . spironolactone  25 mg Oral Daily   Continuous Infusions:  PRN Meds:.    Vitals:   06/17/16 0325 06/17/16 0328 06/17/16 0912 06/17/16 1340  BP:  (!) 104/41 (!) 110/44 105/65  Pulse:  69 75 67  Resp:  (!) 24  17  Temp:  97.7 F (36.5 C)  98 F (36.7 C)  TempSrc:  Oral  Oral  SpO2:  93%  96%  Weight: 80.3 kg (177 lb)     Height:        Intake/Output Summary (Last 24 hours) at 06/17/16 1407 Last data filed at 06/17/16 0855  Gross per 24 hour  Intake              360 ml  Output              375 ml  Net              -15 ml    LABS: Basic Metabolic Panel:  Recent Labs  40/98/11 0305 06/17/16 0300  NA 139 138  K 4.1 3.8  CL 94* 93*  CO2 37* 35*  GLUCOSE 95 96  BUN 21* 28*  CREATININE 1.23* 1.81*  CALCIUM 9.7 9.5   Liver Function Tests: No results for input(s): AST, ALT, ALKPHOS, BILITOT, PROT, ALBUMIN in the last 72 hours. No results for input(s): LIPASE, AMYLASE in the last 72 hours. CBC:  Recent Labs  06/16/16 0305 06/17/16 0300  WBC 9.1 9.1  HGB 9.0* 8.7*  HCT 31.2* 29.9*  MCV 82.1 82.6  PLT 297 266   Cardiac Enzymes: No  results for input(s): CKTOTAL, CKMB, CKMBINDEX, TROPONINI in the last 72 hours. BNP: Invalid input(s): POCBNP D-Dimer: No results for input(s): DDIMER in the last 72 hours. Hemoglobin A1C: No results for input(s): HGBA1C in the last 72 hours. Fasting Lipid Panel: No results for input(s): CHOL, HDL, LDLCALC, TRIG, CHOLHDL, LDLDIRECT in the last 72 hours. Thyroid Function Tests: No results for input(s): TSH, T4TOTAL, T3FREE, THYROIDAB in the last 72 hours.  Invalid input(s): FREET3 Anemia Panel: No results for input(s): VITAMINB12, FOLATE, FERRITIN, TIBC, IRON, RETICCTPCT in the last 72 hours.  RADIOLOGY: Dg Chest 2 View  Result Date: 06/11/2016 CLINICAL DATA:  Short of breath for 1 month EXAM: CHEST  2 VIEW COMPARISON:  11/17/2015 FINDINGS: Heart is moderately enlarged. Mild vascular congestion. No interstitial edema. No consolidation or mass. No pneumothorax or pleural effusion. Stable L1 compression fracture. IMPRESSION: Cardiomegaly and mild vascular congestion. Electronically Signed  By: Jolaine Click M.D.   On: 06/11/2016 14:39   Dg Chest Port 1 View  Result Date: 06/15/2016 CLINICAL DATA:  STAT to confirm placement of right arm PICC line EXAM: PORTABLE CHEST - 1 VIEW COMPARISON:  06/11/2016 FINDINGS: Right arm PICC line extends to the lower SVC. Improvement in the mild pulmonary vascular congestion seen on previous exam. Heart size upper limits normal. Previous CABG. Mild elevation of the left diaphragmatic leaflet. Surgical clips above the thoracic inlet on the right. Left carotid calcifications. Atheromatous aorta. IMPRESSION: 1. PICC line to lower SVC. Electronically Signed   By: Corlis Leak M.D.   On: 06/15/2016 11:46    PHYSICAL EXAM General: NAD. Sitting in chair Neck:JVP flat , no thyromegaly or thyroid nodule.  Lungs: Distant breath sounds but clear  CV: Nondisplaced PMI.  Heart regular S1/S2, no S3/S4, 2/6 SEM RUSB. No edema Abdomen: Soft, nontender, no  hepatosplenomegaly, no distention.  Neurologic: Alert and oriented x 3.  Psych: Normal affect. Extremities: No clubbing or cyanosis.   TELEMETRY: Reviewed telemetry pt in NSR 60s with PVCs  ASSESSMENT AND PLAN: 75 yo with h/o chronic diastolic CHF, valvular heart disease (mitral stenosis), PAF, CAD s/p CABG, severe COPD on home oxygen, and chronic anemia was admitted with increased dyspnea and atrial fibrillation with RVR.  1. Acute on chronic diastolic CHF/valvular heart disease: TEE in 3/17 showed normal EF, mild to moderate mitral stenosis. Most recent RHC in 7/17 showed near-normal filling pressures.  Echo this admission was similar to prior with normal EF, mild to moderate MS, elevated PA pressure.  I saw her about a week ago in the office, volume status was good at that point.  Now does have some volume overload, suspect related to going into atrial fibrillation with RVR.  Now back in NSR.  Still short of breath with any exertion.  - CVP now low. Creatinine up. Continue to hold diuretics. If creatinine improves can go home tomorrow on torsemide 40 bid - Continue 25 mg spironolactone daily.    2. CAD S/P CABG:  She has potential source of angina from 90% stenosis in distal LCx.  The vessel is small at this point and not amenable to PCI. Mild troponin increase this admission (peak 0.06) likely due to CHF exacerbation.  - Continue statin and metoprolol.  - On Xarelto so no aspirin.  - Imdur 90 daily.  3. Atrial fibrillation: Paroxysmal. CHADSVASC score is 4 (age > 83, female, HTN, CHF).  She was admitted with atrial fibrillation recurrence and CHF exacerbation, now s/p DCCV on 11/19.  She remains in NSR on amiodarone. - Continue Xarelto/metoprolol.  - now on po amiodarone.  Long-term amiodarone is going to be difficult given her baseline lung disease, but with size of her atrium, this is likely going to be the only way to keep her in NSR long-term.  4. Pulmonary HTN: This is primarily  pulmonary venous hypertension based on most recent RHC. 5. COPD: Followed by Dr Maple Hudson. PFTs in 2/17 with moderate to severe obstruction.  On 2 liters [place].  This is a major contributor to her dyspnea.  6.  OSA: intolerant CPAP 7.  Mitral stenosis: Rheumatic mitral stenosis.  Mild to moderate mitral stenosis by TEE in 3/17 and again by TTE this admission.  Unable to assess by cath in 3/17 because she had aortic valve vegetation and the valve was not crossed. Unable to assess by cath in 7/17 because she had transient complete heart block when the  LV was entered. Treating medically for now.  Would be high risk for surgery with severe COPD.  However, possibly could be valvuloplasty candidate down the road. 8. Aortic valve vegetation: Noted on TEE in 3/17 done to assess mitral stenosis.  No fever, blood cultures negative.  May have come from prior dental infection.  ID did not recommend antibiotics.  9. Anemia: Chronic Fe deficiency anemia, hemoglobin down to 7.6.  Got IV Fe 11/20 with low ferritin.  No overt bleeding, ?slow GI blood loss.  She was given 1 unit PRBCs 11/21 with appropriate increase in hgb.   If possible, need to continue anticoagulation with recent DCCV.   --hgb 9.0 today 10. AKI - due to overdiuresis. Holding diuretics.   We will sign off. Please call with questions. F/u HF clinic 1-2 weeks.   Arvilla MeresBensimhon, Monica Madill MD 06/17/2016 2:07 PM

## 2016-06-17 NOTE — Progress Notes (Signed)
Physical Therapy Treatment Patient Details Name: [name] MRN: [id number] DOB: 1940-12-29 Today's Date: 06/17/2016    History of Present Illness PT is a 76 yo female admitted with SOB. Found to be in Afib adn underwent a cardioversion.  Pt also in CHF.  Pt with h/o COPD, CABG, CAD, pulmonary HTN, fall 2 years ago with fx of tail bone.  Pt with EF of 60%.    PT Comments    Pt presented sitting OOB in recliner when PT entered room. Pt awake and agreeable to therapy session. Pt making good progress with functional mobility. All VSS throughout. Pt would continue to benefit from skilled physical therapy services at this time while admitted and after d/c to address her limitations in order to improve her overall safety and independence with functional mobility.   Follow Up Recommendations  Home health PT     Equipment Recommendations  None recommended by PT    Recommendations for Other Services       Precautions / Restrictions Precautions Precautions: Fall Precaution Comments: Pt fell two years ago and feels like things have gone down hill ever since. Restrictions Weight Bearing Restrictions: No    Mobility  Bed Mobility Overal bed mobility: Needs Assistance Bed Mobility: Sit to Supine       Sit to supine: Supervision   General bed mobility comments: pt required increased time, supervision for safety  Transfers Overall transfer level: Needs assistance Equipment used: Rolling walker (2 wheeled) Transfers: Sit to/from Stand Sit to Stand: Supervision         General transfer comment: pt required increased time, good technique, supervision for safety. Transfer from recliner.  Ambulation/Gait Ambulation/Gait assistance: Min guard Ambulation Distance (Feet): 75 Feet Assistive device: Rolling walker (2 wheeled) Gait Pattern/deviations: Step-through pattern;Decreased stride length Gait velocity: decreased   General Gait Details: pt ambulated on 2L of O2 with SPO2  maintaining >94% throughout.   Stairs            Wheelchair Mobility    Modified Rankin (Stroke Patients Only)       Balance Overall balance assessment: Needs assistance Sitting-balance support: Feet supported;No upper extremity supported Sitting balance-Leahy Scale: Good     Standing balance support: During functional activity;No upper extremity supported Standing balance-Leahy Scale: Fair                      Cognition Arousal/Alertness: Awake/alert Behavior During Therapy: WFL for tasks assessed/performed Overall Cognitive Status: Within Functional Limits for tasks assessed                      Exercises      General Comments        Pertinent Vitals/Pain Pain Assessment: No/denies pain    Home Living                      Prior Function            PT Goals (current goals can now be found in the care plan section) Acute Rehab PT Goals Patient Stated Goal: to be able to breathe PT Goal Formulation: With patient Time For Goal Achievement: 06/20/16 Potential to Achieve Goals: Good Progress towards PT goals: Progressing toward goals    Frequency    Min 3X/week      PT Plan Current plan remains appropriate    Co-evaluation             End of Session Equipment Utilized  During Treatment: Gait belt;Oxygen (2L of O2 via [place]) Activity Tolerance: Patient limited by fatigue Patient left: in bed;with call bell/phone within reach     Time: 1331-1356 PT Time Calculation (min) (ACUTE ONLY): 25 min  Charges:  $Gait Training: 23-37 mins                    G CodesAlessandra Bevels:      Yassir Enis M Haniel Fix 06/17/2016, 2:06 PM Deborah ChalkJennifer Valorie Mcgrory, PT, DPT [phone]

## 2016-06-17 NOTE — Progress Notes (Signed)
[place] TEAM 1 - Stepdown/ICU TEAM  [name]  ZOX:[id number] DOB: 1940-12-23 DOA: 06/11/2016 PCP: Astrid DivineGRIFFIN,[name], MD    Brief Narrative:  75 y.o. female with a Hx of severe COPD, CAD status post CABG, GERD, rheumatic heart disease, and pulmonary hypertension who was admitted 06/11/16 for worsening shortness of breath and chest pain. Patient had a heart catheterization 7/17 which showed patent bypass grafts and a 90% stenosis in the distal left circumflex that was not amenable to PCI. EF 60% by echocardiogram 3/17. This study also showed a vegetation on the aortic valve, rheumatic mitral stenosis, and a small PFO. PFTs done 2/17 showed moderate-severe obstructive airway disease. A sleep study was negative for OSA. Upon initial evaluation in the ED, patient's BNP was elevated and she was found to be in atrial fibrillation. Cardiology was consulted.  Subjective: The patient states she feels great.  She denies shortness of breath nausea vomiting abdominal pain or chest pain.  She is highly motivated to go home.  Unfortunately her creatinine has increased significantly over the last 24 hours.  I had a very lengthy discussion with the patient and her daughter explaining the multi-factorial etiology of her dyspnea and the fact that it will be very difficult to manage going forward.  I have explained the importance of close monitoring of her weights, strict compliance with her medications, remaining active but not "overdoing it", and the need for close consistent medical follow-up.  Assessment & Plan:  Acute on chronic diastolic heart failure exacerbation felt to be due to RVR - baseline wgt appears to be 81-82kg - Is/Os not accurate due to incontinence - weight trending down - BP was dipping low 11/23 so diuretic held - crt trending up today - cont to hold diuretic (except aldactone) and f/u crt in AM   [facility]   06/15/16 0630 06/16/16 0242 06/17/16 0325  Weight: 82.6 kg (182 lb)  81.9 kg (180 lb 8 oz) 80.3 kg (177 lb)    Acute kidney injury  Creatinine climbing today - hold diuretic again today and recheck in a.m.  Recent Labs Lab 06/14/16 0253 06/14/16 0820 06/15/16 1230 06/16/16 0305 06/17/16 0300  CREATININE 1.03* 0.96 1.01* 1.23* 1.81*    Paroxysmal atrial fibrillation w/ RVR CHA2DS2-VASc is 4 - s/p cardioversion this hospitalization - on Xarelto chronically - Cards following - maintaining sinus rhythm for now  CAD s/p CABG - 90% stenosis L circ  Catheterization 7/17 showed patent bypass grafts with a 90% stenosis in the distal left circumflex not amenable to PCI. Continue nitrates. Mild troponin elevation likely from demand ischemia in the setting of atrial fibrillation.  Normocytic anemia iron deficient - status post iron load and transfusion of one unit PRBC - need to consider GI evaluation but on chronic anti-coagulation - this will need to be coordinated in the outpatient setting once it is safe to hold blood thinners (not currently as pt post DCCV)  Pulmonary venous hypertension  Per RHC - Cardiology following   OSA Unable to tolerate CPAP  COPD Followed by Dr. Maple HudsonYoung as an outpatient  Acute on chronic respiratory failure with hypoxia  Due to above - saturations presently stable on 2 L nasal cannula while at rest  Multifactorial Dyspnea on Exertion  Due to all above issues - will be difficult to correct to signif extent - spent 20-30 minutes today on bedside education   HTN  Blood pressure currently modestly low but being tolerated - hold further diuresis for today  Dyslipidemia Continue Zocor  Hypokalemia Replaced  Rheumatic mitral valve disease / Mitral stenosis Cardiology following  Aortic valve vegetation Per TEE March 2017 - no evidence of active bacteremia   DVT prophylaxis: Xarelto  Code Status: FULL CODE Family Communication: no family present at time of exam  Disposition Plan: possible d/c home in AM dependent upon  renal fxn    Consultants:  [facility]  Procedures: 11/19 cardioversion  Antimicrobials:  None  Objective: Blood pressure (!) 110/44, pulse 75, temperature 97.7 F (36.5 C), temperature source Oral, resp. rate (!) 24, height 5\' 6"  (1.676 m), weight 80.3 kg (177 lb), SpO2 93 %.  Intake/Output Summary (Last 24 hours) at 06/17/16 1115 Last data filed at 06/17/16 0855  Gross per 24 hour  Intake              600 ml  Output              375 ml  Net              225 ml   Filed Weights   06/15/16 0630 06/16/16 0242 06/17/16 0325  Weight: 82.6 kg (182 lb) 81.9 kg (180 lb 8 oz) 80.3 kg (177 lb)    Examination: General: No acute respiratory distress at rest in bedside chair  Lungs: No wheezing or crackles - good air movement th/o  Cardiovascular: Regular rate without murmur or rub  Abdomen: Nontender, nondistended, soft, bowel sounds positive, no rebound Extremities: trace bilateral lower extremity edema  CBC:  Recent Labs Lab 06/11/16 1442 06/13/16 0933 06/14/16 0253 06/14/16 2216 06/15/16 0302 06/16/16 0305 06/17/16 0300  WBC 6.5 7.4 9.6  --  9.1 9.1 9.1  NEUTROABS 4.0  --   --   --   --   --   --   HGB 8.5* 7.6* 7.6* 8.6* 8.6* 9.0* 8.7*  HCT 28.9* 27.2* 26.8* 29.6* 29.2* 31.2* 29.9*  MCV 80.7 82.4 82.5  --  81.8 82.1 82.6  PLT 336 303 310  --  289 297 266   Basic Metabolic Panel:  Recent Labs Lab 06/11/16 2157  06/14/16 0253 06/14/16 0820 06/15/16 1230 06/16/16 0305 06/17/16 0300  NA  --   < > 141 140 140 139 138  K  --   < > 3.6 3.6 3.4* 4.1 3.8  CL  --   < > 98* 100* 94* 94* 93*  CO2  --   < > 35* 30 36* 37* 35*  GLUCOSE  --   < > 112* 93 98 95 96  BUN  --   < > 11 10 14  21* 28*  CREATININE  --   < > 1.03* 0.96 1.01* 1.23* 1.81*  CALCIUM  --   < > 9.3 9.3 9.5 9.7 9.5  MG 2.1  --   --   --   --   --   --   < > = values in this interval not displayed. GFR: Estimated Creatinine Clearance: 28.3 mL/min (by C-G formula based on SCr of 1.81 mg/dL  (H)).  Liver Function Tests:  Recent Labs Lab 06/14/16 0253  AST 13*  ALT 10*  ALKPHOS 56  BILITOT 0.5  PROT 6.5  ALBUMIN 3.2*   Cardiac Enzymes:  Recent Labs Lab 06/11/16 1442 06/11/16 2157 06/12/16 0415 06/12/16 0947  TROPONINI 0.07* 0.06* 0.05* 0.04*    Recent Results (from the past 240 hour(s))  MRSA PCR Screening     Status: None   Collection Time: 06/11/16  9:33 PM  Result Value Ref Range Status   MRSA by PCR NEGATIVE NEGATIVE Final    Comment:        The GeneXpert MRSA Assay (FDA approved for NASAL specimens only), is one component of a comprehensive MRSA colonization surveillance program. It is not intended to diagnose MRSA infection nor to guide or monitor treatment for MRSA infections.      Scheduled Meds: . amiodarone  400 mg Oral BID  . calcium-vitamin D  2 tablet Oral Q breakfast  . ferrous sulfate  325 mg Oral BID WC  . isosorbide mononitrate  60 mg Oral Daily  . lisinopril  2.5 mg Oral Daily  . metoprolol  50 mg Oral BID  . pantoprazole  40 mg Oral Daily  . rivaroxaban  20 mg Oral Q supper  . sertraline  100 mg Oral QHS  . simvastatin  60 mg Oral q1800  . sodium chloride flush  10-40 mL Intracatheter Q12H  . spironolactone  25 mg Oral Daily      LOS: 6 days   Lonia Blood, MD [facility] Office  [phone] Pager - Text Page per Amion as per below:  On-Call/Text Page:      [email or web address]      password [email or web address]  If 7PM-7AM, please contact night-coverage [email or web address] Password [email or web address] 06/17/2016, 11:15 AM

## 2016-06-18 LAB — CBC
HCT: 30.3 % — ABNORMAL LOW (ref 36.0–46.0)
Hemoglobin: 8.9 g/dL — ABNORMAL LOW (ref 12.0–15.0)
MCH: 24.5 pg — AB (ref 26.0–34.0)
MCHC: 29.4 g/dL — ABNORMAL LOW (ref 30.0–36.0)
MCV: 83.5 fL (ref 78.0–100.0)
PLATELETS: 271 10*3/uL (ref 150–400)
RBC: 3.63 MIL/uL — AB (ref 3.87–5.11)
RDW: 17.6 % — AB (ref 11.5–15.5)
WBC: 8.6 10*3/uL (ref 4.0–10.5)

## 2016-06-18 LAB — BASIC METABOLIC PANEL
Anion gap: 9 (ref 5–15)
BUN: 27 mg/dL — AB (ref 6–20)
CALCIUM: 9.6 mg/dL (ref 8.9–10.3)
CHLORIDE: 93 mmol/L — AB (ref 101–111)
CO2: 35 mmol/L — ABNORMAL HIGH (ref 22–32)
CREATININE: 1.53 mg/dL — AB (ref 0.44–1.00)
GFR calc non Af Amer: 32 mL/min — ABNORMAL LOW (ref >60)
GFR, EST AFRICAN AMERICAN: 37 mL/min — AB (ref >60)
Glucose, Bld: 102 mg/dL — ABNORMAL HIGH (ref 65–99)
Potassium: 3.6 mmol/L (ref 3.5–5.1)
SODIUM: 137 mmol/L (ref 135–145)

## 2016-06-18 MED ORDER — AMIODARONE HCL 200 MG PO TABS: 200.0000 mg | ORAL_TABLET | Freq: Two times a day (BID) | ORAL | 0 refills | Status: DC

## 2016-06-18 MED ORDER — SIMVASTATIN 20 MG PO TABS: 60.0000 mg | ORAL_TABLET | Freq: Every day | ORAL | 0 refills | Status: DC

## 2016-06-18 MED ORDER — RIVAROXABAN 15 MG PO TABS: 15.0000 mg | ORAL_TABLET | Freq: Every day | ORAL | 0 refills | Status: DC

## 2016-06-18 MED ORDER — POTASSIUM CHLORIDE CRYS ER 20 MEQ PO TBCR: 40.0000 meq | EXTENDED_RELEASE_TABLET | Freq: Two times a day (BID) | ORAL | 3 refills | Status: DC

## 2016-06-18 MED ORDER — AMIODARONE HCL 200 MG PO TABS: 200.0000 mg | ORAL_TABLET | Freq: Two times a day (BID) | ORAL | Status: DC

## 2016-06-18 MED ORDER — LISINOPRIL 2.5 MG PO TABS: 2.5000 mg | ORAL_TABLET | Freq: Every day | ORAL | 0 refills | Status: DC

## 2016-06-18 MED ORDER — ISOSORBIDE MONONITRATE ER 60 MG PO TB24: 60.0000 mg | ORAL_TABLET | Freq: Every day | ORAL | 0 refills | Status: DC

## 2016-06-18 MED ORDER — TORSEMIDE 20 MG PO TABS: 40.0000 mg | ORAL_TABLET | Freq: Two times a day (BID) | ORAL | 0 refills | Status: DC

## 2016-06-18 NOTE — Discharge Summary (Signed)
DISCHARGE SUMMARY  Monica GoresJoyce R Lloyd  MR#: [id number]  DOB:04-19-1941  Date of Admission: 06/11/2016 Date of Discharge: 06/18/2016  Attending Physician:Sherah Lund T  Patient's WUJ:WJXBJYN,WGNFAOPCP:Lloyd,Monica Thomasena EdisOLLINS, MD  Consults:  [facility]  Disposition: D/C home   Follow-up Appts: Follow-up Information    [facility] Follow up.   Why:  Home Health RN Contact information: [address] [place] [phone]        Tonye BecketAmy Clegg, NP Follow up on 06/27/2016.   Specialty:  Cardiology Why:  at 1020 for post hospital follow up. Code for parking is 0040. Please bring all of your medications to your visit.  Contact information: [address] [place] [address] [phone]        Astrid DivineGRIFFIN,Monica COLLINS, MD Follow up in 1 week(s).   Specialty:  Family Medicine Contact information: [address] [place] [address] [phone]           Tests Needing Follow-up: -Routine monitoring of renal function -Usual CHF follow-up issues to include weight, volume status, and tolerance of medications -Consideration to further weaning of amiodarone -Assessment of rate control and compliance with anticoagulation in setting of atrial fibrillation -Monitoring of anemia with consideration given to eventual outpatient screening GI evaluation  Discharge Diagnoses: Acute on chronic diastolic heart failure Acute kidney injury  Paroxysmal atrial fibrillation w/ RVR CAD s/p CABG - 90% stenosis L circ  Normocytic anemia Pulmonary venous hypertension  OSA COPD Acute on chronic respiratory failure with hypoxia  Multifactorial Dyspnea on Exertion  HTN  Dyslipidemia Hypokalemia 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 Patient had a heart catheterization 7/17 which showed patent bypass grafts and a 90% stenosis in the distal left circumflex that was not amenable to PCI. EF 60% by echocardiogram 3/17. This study also showed a vegetation on the aortic valve, rheumatic mitral stenosis, and a small PFO. PFTs done 2/17 showed moderate-severe obstructive airway disease. A sleep study was negative for OSA. Upon initial evaluation in the ED, patient's BNP was elevated and she was found to be in atrial fibrillation. Cardiology was consulted.  Hospital Course:  Acute on chronic diastolic heart failure exacerbation felt to be due to RVR - baseline wgt appears to be 81-82kg - Is/Os not accurate during stay due to incontinence - weight trending down at time of d/c - BP was dipping low 11/23 so diuretic held - crt trended up 11/24 so diuretic held again - creatinine improved 11/25 so patient being discharged with orders to resume diuretic 11/26 Filed Weights   06/16/16 0242 06/17/16 0325 06/18/16 0351  Weight: 81.9 kg (180 lb 8 oz) 80.3 kg (177 lb) 79.7 kg (175 lb 12.8 oz)    Acute kidney injury  Creatinine improved today - to resume diuretic 11/26   Recent Labs Lab 06/14/16 0820 06/15/16 1230 06/16/16 0305 06/17/16 0300 06/18/16 0337  CREATININE 0.96 1.01* 1.23* 1.81* 1.53*    Paroxysmal atrial fibrillation w/ RVR CHA2DS2-VASc is 4 - s/p cardioversion this hospitalization - on Xarelto chronically - Cards followed - maintaining sinus rhythm at time of d/c - amio dose decreased from 400 BID at time of d/c after discussion w/ Cards  CAD s/p CABG - 90% stenosis L circ  Catheterization 7/17 showed patent bypass grafts with  a 90% stenosis in the distal left circumflex not amenable to PCI. Continue nitrates. Mild troponin elevation likely from demand ischemia in the setting of atrial fibrillation.  Normocytic anemia iron deficient - status post iron load and transfusion of  one unit PRBC - need to consider eventual GI evaluation but on chronic anti-coagulation - this will need to be coordinated in the outpatient setting once it is safe to hold blood thinners (not currently as pt post DCCV)  Pulmonary venous hypertension  Per [facility] - Cardiology following   OSA Reportedly unable to tolerate CPAP, though pt reports she has recently been told by her Pulmonologist that she did not actually need CPAP  COPD Followed by Dr. 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   amiodarone 200 MG tablet Commonly known as:  PACERONE Take 1 tablet (200 mg total) by mouth 2 (two) times daily.   calcium-vitamin D 500-200 MG-UNIT tablet Commonly known as:  OSCAL WITH D Take 2 tablets by mouth daily with breakfast.   IRON PO Take 1 tablet by mouth daily.   isosorbide mononitrate 60 MG 24 hr tablet Commonly known as:  IMDUR Take 1 tablet (60 mg total) by mouth daily. Start taking on:  06/19/2016   lisinopril 2.5 MG tablet Commonly known as:  PRINIVIL,ZESTRIL Take 1 tablet (2.5 mg total) by mouth daily. Start taking on:  06/19/2016   loratadine 10 MG tablet Commonly known as:  CLARITIN Take 10 mg by mouth at bedtime as  needed for allergies.   metoprolol 50 MG tablet Commonly known as:  LOPRESSOR Take 1 tablet (50 mg total) by mouth 2 (two) times daily.   NITROSTAT 0.4 MG SL tablet Generic drug:  nitroGLYCERIN place 1 tablet under the tongue if needed every 5 minutes for chest pain for 3 doses IF NO RELIEF AFTER 3RD DOSE CALL PRESCRIBER OR 911.   omeprazole 20 MG tablet Commonly known as:  PRILOSEC OTC Take 20 mg by mouth at bedtime as needed (GERD).   OXYGEN Inhale 2-3 L into the lungs continuous.   potassium chloride SA 20 MEQ tablet Commonly known as:  K-DUR,KLOR-CON Take 2 tablets (40 mEq total) by mouth 2 (two) times daily. Start taking on:  06/19/2016   Rivaroxaban 15 MG Tabs tablet Commonly known as:  XARELTO Take 1 tablet (15 mg total) by mouth daily with supper. What changed:  medication strength  how much to take   sertraline 100 MG tablet Commonly known as:  ZOLOFT Take 100 mg by mouth at bedtime.   simvastatin 20 MG tablet Commonly known as:  ZOCOR Take 3 tablets (60 mg total) by mouth daily at 6 PM. What changed:  See the new instructions.   spironolactone 25 MG tablet Commonly known as:  ALDACTONE take 1 tablet by mouth once daily What changed:  See the new instructions.   torsemide 20 MG tablet Commonly known as:  DEMADEX Take 2 tablets (40 mg total) by mouth 2 (two) times daily. Start taking on:  06/19/2016 What changed:  how much to take       Day of Discharge BP (!) 120/50 (BP Location: Left Arm)   Pulse 66   Temp 97.9 F (36.6 C) (Oral)   Resp 20   Ht 5\' 6"  (1.676 m)   Wt 79.7 kg (175 lb 12.8 oz)   SpO2 96%   BMI 28.37 kg/m   Physical Exam: General: No acute respiratory distress Lungs: Clear to auscultation bilaterally  Cardiovascular: Regular rate and rhythm without murmur  Abdomen: Nontender, nondistended, soft, bowel sounds positive, no rebound, no ascites, no appreciable mass Extremities: Trace edema bilateral lower extremities  Basic  Metabolic Panel:  Recent Labs Lab 06/11/16 2157  06/14/16 0820 06/15/16 1230 06/16/16 0305 06/17/16 0300 06/18/16 0337  NA  --   < > 140 140 139 138 137  K  --   < > 3.6 3.4* 4.1 3.8 3.6  CL  --   < > 100* 94* 94* 93* 93*  CO2  --   < > 30 36* 37* 35* 35*  GLUCOSE  --   < > 93 98 95 96 102*  BUN  --   < > 10 14 21* 28* 27*  CREATININE  --   < > 0.96 1.01* 1.23* 1.81* 1.53*  CALCIUM  --   < > 9.3 9.5 9.7 9.5 9.6  MG 2.1  --   --   --   --   --   --   < > = values in this interval not displayed.  Liver Function Tests:  Recent Labs Lab 06/14/16 0253  AST 13*  ALT 10*  ALKPHOS 56  BILITOT 0.5  PROT 6.5  ALBUMIN 3.2*   CBC:  Recent Labs Lab 06/11/16 1442  06/14/16 0253 06/14/16 2216 06/15/16 0302 06/16/16 0305 06/17/16 0300 06/18/16 0337  WBC 6.5  < > 9.6  --  9.1 9.1 9.1 8.6  NEUTROABS 4.0  --   --   --   --   --   --   --   HGB 8.5*  < > 7.6* 8.6* 8.6* 9.0* 8.7* 8.9*  HCT 28.9*  < > 26.8* 29.6* 29.2* 31.2* 29.9* 30.3*  MCV 80.7  < > 82.5  --  81.8 82.1 82.6 83.5  PLT 336  < > 310  --  289 297 266 271  < > = values in this interval not displayed.  Cardiac Enzymes:  Recent Labs Lab 06/11/16 1442 06/11/16 2157 06/12/16 0415 06/12/16 0947  TROPONINI 0.07* 0.06* 0.05* 0.04*   BNP (last 3 results)  Recent Labs  05/05/16 0958 06/06/16 0957 06/11/16 1443  BNP 319.4* 443.7* 856.4*    Recent Results (from the past 240 hour(s))  MRSA PCR Screening     Status: None   Collection Time: 06/11/16  9:33 PM  Result Value Ref Range Status   MRSA by PCR NEGATIVE NEGATIVE Final    Comment:        The GeneXpert MRSA Assay (FDA approved for NASAL specimens only), is one component of a comprehensive MRSA colonization surveillance program. It is not intended to diagnose MRSA infection nor to guide or monitor treatment  for MRSA infections.      Time spent in discharge (includes decision making & examination of pt): >30 minutes  06/18/2016, 12:36 PM    Lonia Blood, MD [facility] Office  [phone] Pager [phone]  On-Call/Text Page:      [email or web address]      password [facility]

## 2016-06-18 NOTE — Progress Notes (Signed)
Went over all d/c instructions with pt and her daughter

## 2016-06-18 NOTE — Progress Notes (Addendum)
Pt discharged with all d/c info including prescriptions and personal belongings including glasses and upper partial in mouth Accompanied by daughter and Nurse tech  Daughter brought pt's own 48 tank  to the hospital for home discharge Home Via private vehicle

## 2016-06-23 ENCOUNTER — Telehealth: Payer: Self-pay | Admitting: Internal Medicine

## 2016-06-23 NOTE — Telephone Encounter (Signed)
Ok to use the Sinex spray, but not more than once daily, because it can cause a rebound.  During the day try using otc nasal saline spray

## 2016-06-23 NOTE — Telephone Encounter (Signed)
Spoke with the pt and notified of recs per CDY  She verbalized understanding  Nothing further needed 

## 2016-06-23 NOTE — Telephone Encounter (Signed)
Spoke with the [name]  She is c/o runny nose and occ sinus pressure/HA  She is taking clariton w/o relief  She states that she bought some sinex spray otc and wants to know if CDY thinks this would be okay for her to take  She states when her nose is not runny, it's too stuffy  Please advise thanks!  Allergies  Allergen Reactions  . Prednisone Rash    jittery  . Tape Other (See Comments)    Bleeding  . Xopenex [Levalbuterol] Other (See Comments)    Made mouth and throat sore  . Cefpodoxime Other (See Comments)    Yeast infections   . Codeine Other (See Comments)    Anxious/ jittery  . Lipitor [Atorvastatin] Other (See Comments)    Made joint start hurting/ Muscle aches  . Other Swelling    Venholin HFA  . Penicillins Other (See Comments)    Has patient had a PCN reaction causing immediate rash, facial/tongue/throat swelling, SOB or lightheadedness with hypotension: unknown, childhood reaction Has patient had a PCN reaction causing severe rash involving mucus membranes or skin necrosis: No Has patient had a PCN reaction that required hospitalization No Has patient had a PCN reaction occurring within the last 10 years: No If all of the above answers are "NO", then may proceed with Cephalosporin use.   Terald Sleeper [Kdc:Albuterol] Other (See Comments)    "jittery"  . Anoro Ellipta [Umeclidinium-Vilanterol]     itchy  . Clindamycin Other (See Comments)    Unknown   . Hydrocodone-Acetaminophen Anxiety  . Latex Itching and Rash  . Levalbuterol Hcl Other (See Comments)    Unknown    Current Outpatient Prescriptions on File Prior to Visit  Medication Sig Dispense Refill  . acetaminophen (TYLENOL) 500 MG tablet Take 1,000 mg by mouth 2 (two) times daily as needed for headache.     Marland Kitchen amiodarone (PACERONE) 200 MG tablet Take 1 tablet (200 mg total) by mouth 2 (two) times daily. 60 tablet 0  . calcium-vitamin D (OSCAL WITH D) 500-200 MG-UNIT per tablet Take 2 tablets by mouth daily with  breakfast.    . IRON PO Take 1 tablet by mouth daily.    . isosorbide mononitrate (IMDUR) 60 MG 24 hr tablet Take 1 tablet (60 mg total) by mouth daily. 30 tablet 0  . lisinopril (PRINIVIL,ZESTRIL) 2.5 MG tablet Take 1 tablet (2.5 mg total) by mouth daily. 30 tablet 0  . loratadine (CLARITIN) 10 MG tablet Take 10 mg by mouth at bedtime as needed for allergies.     . metoprolol (LOPRESSOR) 50 MG tablet Take 1 tablet (50 mg total) by mouth 2 (two) times daily. 180 tablet 3  . NITROSTAT 0.4 MG SL tablet place 1 tablet under the tongue if needed every 5 minutes for chest pain for 3 doses IF NO RELIEF AFTER 3RD DOSE CALL PRESCRIBER OR 911. 25 tablet 0  . omeprazole (PRILOSEC OTC) 20 MG tablet Take 20 mg by mouth at bedtime as needed (GERD).     . OXYGEN Inhale 2-3 L into the lungs continuous.     . potassium chloride SA (K-DUR,KLOR-CON) 20 MEQ tablet Take 2 tablets (40 mEq total) by mouth 2 (two) times daily. 360 tablet 3  . Rivaroxaban (XARELTO) 15 MG TABS tablet Take 1 tablet (15 mg total) by mouth daily with supper. 30 tablet 0  . sertraline (ZOLOFT) 100 MG tablet Take 100 mg by mouth at bedtime.     . simvastatin (ZOCOR)  20 MG tablet Take 3 tablets (60 mg total) by mouth daily at 6 PM. 90 tablet 0  . spironolactone (ALDACTONE) 25 MG tablet take 1 tablet by mouth once daily (Patient taking differently: Take 25 mg by mouth once daily) 30 tablet 6  . torsemide (DEMADEX) 20 MG tablet Take 2 tablets (40 mg total) by mouth 2 (two) times daily. 120 tablet 0   No current facility-administered medications on file prior to visit.

## 2016-06-27 ENCOUNTER — Ambulatory Visit (HOSPITAL_COMMUNITY)
Admit: 2016-06-27 | Discharge: 2016-06-27 | Disposition: A | Discharge status: Home / Self Care | Payer: Medicare Other | Attending: Cardiology | Admitting: Cardiology

## 2016-06-27 VITALS — BP 136/54 | HR 55 | Wt 183.6 lb

## 2016-06-27 DIAGNOSIS — I272 Pulmonary hypertension, unspecified: Secondary | ICD-10-CM | POA: Diagnosis not present

## 2016-06-27 DIAGNOSIS — I480 Paroxysmal atrial fibrillation: Secondary | ICD-10-CM | POA: Diagnosis not present

## 2016-06-27 DIAGNOSIS — I25119 Atherosclerotic heart disease of native coronary artery with unspecified angina pectoris: Secondary | ICD-10-CM | POA: Diagnosis not present

## 2016-06-27 DIAGNOSIS — Z9981 Dependence on supplemental oxygen: Secondary | ICD-10-CM | POA: Diagnosis not present

## 2016-06-27 DIAGNOSIS — I059 Rheumatic mitral valve disease, unspecified: Secondary | ICD-10-CM

## 2016-06-27 DIAGNOSIS — Q211 Atrial septal defect: Secondary | ICD-10-CM | POA: Diagnosis not present

## 2016-06-27 DIAGNOSIS — Z800 Family history of malignant neoplasm of digestive organs: Secondary | ICD-10-CM | POA: Diagnosis not present

## 2016-06-27 DIAGNOSIS — Z8711 Personal history of peptic ulcer disease: Secondary | ICD-10-CM | POA: Diagnosis not present

## 2016-06-27 DIAGNOSIS — F17210 Nicotine dependence, cigarettes, uncomplicated: Secondary | ICD-10-CM | POA: Insufficient documentation

## 2016-06-27 DIAGNOSIS — I5032 Chronic diastolic (congestive) heart failure: Secondary | ICD-10-CM | POA: Diagnosis present

## 2016-06-27 DIAGNOSIS — F419 Anxiety disorder, unspecified: Secondary | ICD-10-CM | POA: Insufficient documentation

## 2016-06-27 DIAGNOSIS — G4733 Obstructive sleep apnea (adult) (pediatric): Secondary | ICD-10-CM | POA: Insufficient documentation

## 2016-06-27 DIAGNOSIS — Z825 Family history of asthma and other chronic lower respiratory diseases: Secondary | ICD-10-CM | POA: Insufficient documentation

## 2016-06-27 DIAGNOSIS — K047 Periapical abscess without sinus: Secondary | ICD-10-CM | POA: Diagnosis not present

## 2016-06-27 DIAGNOSIS — I330 Acute and subacute infective endocarditis: Secondary | ICD-10-CM | POA: Diagnosis not present

## 2016-06-27 DIAGNOSIS — K449 Diaphragmatic hernia without obstruction or gangrene: Secondary | ICD-10-CM | POA: Diagnosis not present

## 2016-06-27 DIAGNOSIS — F329 Major depressive disorder, single episode, unspecified: Secondary | ICD-10-CM | POA: Insufficient documentation

## 2016-06-27 DIAGNOSIS — Z79899 Other long term (current) drug therapy: Secondary | ICD-10-CM | POA: Insufficient documentation

## 2016-06-27 DIAGNOSIS — Z7901 Long term (current) use of anticoagulants: Secondary | ICD-10-CM | POA: Diagnosis not present

## 2016-06-27 DIAGNOSIS — K219 Gastro-esophageal reflux disease without esophagitis: Secondary | ICD-10-CM | POA: Diagnosis not present

## 2016-06-27 DIAGNOSIS — J449 Chronic obstructive pulmonary disease, unspecified: Secondary | ICD-10-CM | POA: Diagnosis not present

## 2016-06-27 DIAGNOSIS — I050 Rheumatic mitral stenosis: Secondary | ICD-10-CM | POA: Diagnosis not present

## 2016-06-27 DIAGNOSIS — M1990 Unspecified osteoarthritis, unspecified site: Secondary | ICD-10-CM | POA: Diagnosis not present

## 2016-06-27 DIAGNOSIS — I110 Hypertensive heart disease with heart failure: Secondary | ICD-10-CM | POA: Insufficient documentation

## 2016-06-27 DIAGNOSIS — Z9889 Other specified postprocedural states: Secondary | ICD-10-CM | POA: Diagnosis not present

## 2016-06-27 DIAGNOSIS — E785 Hyperlipidemia, unspecified: Secondary | ICD-10-CM | POA: Insufficient documentation

## 2016-06-27 DIAGNOSIS — Z951 Presence of aortocoronary bypass graft: Secondary | ICD-10-CM | POA: Insufficient documentation

## 2016-06-27 LAB — BASIC METABOLIC PANEL
Anion gap: 8 (ref 5–15)
BUN: 32 mg/dL — ABNORMAL HIGH (ref 6–20)
CALCIUM: 9.4 mg/dL (ref 8.9–10.3)
CO2: 35 mmol/L — AB (ref 22–32)
CREATININE: 1.59 mg/dL — AB (ref 0.44–1.00)
Chloride: 95 mmol/L — ABNORMAL LOW (ref 101–111)
GFR calc non Af Amer: 30 mL/min — ABNORMAL LOW (ref >60)
GFR, EST AFRICAN AMERICAN: 35 mL/min — AB (ref >60)
Glucose, Bld: 96 mg/dL (ref 65–99)
Potassium: 4.5 mmol/L (ref 3.5–5.1)
Sodium: 138 mmol/L (ref 135–145)

## 2016-06-27 LAB — BRAIN NATRIURETIC PEPTIDE: B Natriuretic Peptide: 193.8 pg/mL — ABNORMAL HIGH (ref 0.0–100.0)

## 2016-06-27 MED ORDER — AMIODARONE HCL 200 MG PO TABS: 200.0000 mg | ORAL_TABLET | Freq: Every day | ORAL | 6 refills | Status: DC

## 2016-06-27 NOTE — Patient Instructions (Signed)
Routine lab work today. Will notify you of abnormal results, otherwise no news is good news!  DECREASE Amiodarone to 200 mg tablet ONCE daily.  Follow up with Amy Clegg NP-C in 6 weeks.  Do the following things EVERYDAY: 1) Weigh yourself in the morning before breakfast. Write it down and keep it in a log. 2) Take your medicines as prescribed 3) Eat low salt foods-Limit salt (sodium) to 2000 mg per day.  4) Stay as active as you can everyday 5) Limit all fluids for the day to less than 2 liters

## 2016-06-27 NOTE — Progress Notes (Signed)
Advanced Heart Failure Medication Review by a Pharmacist  Does the patient  feel that his/her medications are working for him/her?  yes  Has the patient been experiencing any side effects to the medications prescribed?  no  Does the patient measure his/her own blood pressure or blood glucose at home?  yes   Does the patient have any problems obtaining medications due to transportation or finances?   No - [employer] patient assistance for Xarelto  Understanding of regimen: good Understanding of indications: good Potential of compliance: good Patient understands to avoid NSAIDs. Patient understands to avoid decongestants.  Issues to address at subsequent visits: None    Pharmacist comments:  [name] is a pleasant 75 yo F presenting with her son and a recent hospital discharge medication list. She reports good compliance and did not have any specific medication-related questions or concerns for me at this time.   Tyler Deis. [name], PharmD, BCPS, CPP Clinical Pharmacist Pager: [phone] Phone: [phone] 06/27/2016 11:06 AM     Time with patient: 10 min Preparation and documentation time: 2 min Total time: 12 min

## 2016-06-27 NOTE — Progress Notes (Signed)
Patient ID: [name], female   DOB: 02/22/1941, 75 y.o.   MRN: [id number]    Advanced Heart Failure Clinic Note   PCP: Dr Valentina Lucks  Pulmonary: Dr Maple Hudson HF Cardiology: Dr. Shirlee Latch  HPI: [name] is a 75 y.o. female with a history of chronic diastolic CHF, moderate pulmonary HTN, HTN, CAD s/p CABG, COPD on home oxygen, and dyslipidemia referred to the HF clinic by Dr Mayford Knife.    Admitted 3/2 - 09/30/15 with A/C CHF. TEE was done to assess mitral stenosis, appeared mild to moderate, rheumatic valve.  TEE also showed aortic valve vegetation and concern arose for endocarditis with recent "tooth infection" per pt.  ID saw. 1 of 2 admission cultures with Gram + cocci in clusters thought to be contaminant.  2 further sets of BCx negative. RHC showed elevated right and left heart filling pressures.  Aortic valve was not crossed for full mitral stenosis assessment due to aortic valve vegetation. Diuresed 8 lbs. Discharge weight 175 lbs.   Due to increased dyspnea and exertional chest pain, she had RHC/LHC in 7/17.  Bypass grafts were patent but there was 90% stenosis in the distal LCx that could account for angina.  It was a small caliber vessel and not amenable to PCI. Filling pressures were near-normal on RHC.   Admitted 11/18 through 06/18/16 with chest pain and increased dyspnea. Exacerbation was thought to be from A fib RVR. She underwent successful DC/CV on 11/19. Diuresed with IV lasix and transitioned to  Discharged on amio 200 mg twice a day+ xarelto 15 mg daily. Discharge weight was 175 pounds.     She returns today for post hospital follow up with her son. Overall feeling ok. Says she is getting stronger every day. Denies dyspnea at rest. Mild dyspnea with exertion. Denies PND/Orthopnea. Ambulating with a rolling walker. Weight at home 181. No bleeding problems. Taking all medications.   - ECHO 12/2014 EF 55-60%, peak PA pressure 68 mmHg, mild to moderate MS. - ECHO 2/17 EF 60-65%, mild LVH,  mildly dilated RV with mildly decreased systolic function, PASP 29 mmHg (not sure we got a full envelope), moderate mitral stenosis with mean gradient 6 mmHg and MVA 1.37 cm^2, mild MR, mild AI, aortic sclerosis without significant stenosis.   - TEE 09/24/15 EF 60%, small 0.8 cm mobile mass on the LV side of the aortic valve. Mild AI and AS. 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 A small PFO with a weakly positive bubble study, Moderate TR  PFTs (2/17): moderate-severe obstructive airways disease.   Sleep study 2017 was negative, no significant OSA.   RHC 09/24/15 (did not do LHC and cross valve to assess mitral stenosis due to aortic valve vegetation): Hemodynamics (mmHg) RA mean 16 RV 65/14 PA 71/28, mean 45 PCWP mean 32 Cardiac Output (Fick) 4.85  Cardiac Index (Fick) 2.51 PVR 2.68 WU  LHC/RHC (7/17): 50% pLAD, patent LIMA-LAD, totally occluded PLOM, patent SVG-PLOM, AV LCx distal to PLOM with 90% stenosis (small caliber), totally occluded PLV, patent SVG-PLV.  RA mean 5 PA 43/13 PCWP mean 17 CI 2.7 PVR 1.9 WU Transient CHB when LV was entered, so unable to measure gradient across MV.   Labs 01/09/2015 K 4.3 Creatinine 0.91 BNP  Labs 02/19/2015 K 4.1 Creatinine 1.03 Labs 2/17 LDL 82 Labs 10/05/15 K 3.8, Creatinine 0.89  Labs 10/08/15 K 4.6, Creatinine 0.7 Labs 6/17 K 4.3, creatinine 0.95, HCT 32.7, BNP  221 Labs 7/17 BNP 286, K 4.2, creatinine 1.0, HCT 31.2 Labs 10/17 K 3.5, creatinine 1.18, LDL 78  Past Medical History:  Diagnosis Date  . Acute parotitis 01/05/2016  . Anxiety   . Bacteremia, escherichia coli   . Bursitis, shoulder    bilateral  . CHF (congestive heart failure) (HCC)    chronic diasotlic CHF  . Complication of anesthesia    "have trouble getting me awake sometimes; been ok latelhy" (09/24/2015)  . COPD (chronic obstructive pulmonary disease) (HCC)   . Coronary heart disease 2001   s/p CABG  . Depression   . DJD  (degenerative joint disease)   . GERD (gastroesophageal reflux disease)   . H/O hiatal hernia   . History of rheumatic heart disease    X 3-LAST TIME WHEN PATIENT WAS 75 YRS OLD  . Hyperlipidemia   . Hypertension   . OA (osteoarthritis)   . On home oxygen therapy    "2L; 24/7" (09/24/2015)  . PUD (peptic ulcer disease)   . Pulmonary HTN    PASP 50-6255mmHg by [id number]  . Pulmonary HTN   . Sleep apnea    intolerant to CPAP  . Thyroid nodule   . Urinary incontinence   . Varicose veins     Social History: The patient  reports that she quit smoking about 7 years ago. Her smoking use included Cigarettes. She has a 47 pack-year smoking history. She has never used smokeless tobacco. She reports that she does not drink alcohol or use illicit drugs.   Family History: The patient's family history includes Anemia in her mother; Emphysema in her father; Esophageal cancer in her daughter.   ROS: All systems negative except as listed in HPI, PMH and Problem List.  Current Outpatient Prescriptions  Medication Sig Dispense Refill  . acetaminophen (TYLENOL) 500 MG tablet Take 1,000 mg by mouth 2 (two) times daily as needed for headache.     Marland Kitchen. amiodarone (PACERONE) 200 MG tablet Take 1 tablet (200 mg total) by mouth 2 (two) times daily. 60 tablet 0  . calcium-vitamin D (OSCAL WITH D) 500-200 MG-UNIT per tablet Take 2 tablets by mouth daily with breakfast.    . IRON PO Take 1 tablet by mouth daily.    . isosorbide mononitrate (IMDUR) 60 MG 24 hr tablet Take 1 tablet (60 mg total) by mouth daily. 30 tablet 0  . lisinopril (PRINIVIL,ZESTRIL) 2.5 MG tablet Take 1 tablet (2.5 mg total) by mouth daily. 30 tablet 0  . loratadine (CLARITIN) 10 MG tablet Take 10 mg by mouth at bedtime as needed for allergies.     . metoprolol (LOPRESSOR) 50 MG tablet Take 1 tablet (50 mg total) by mouth 2 (two) times daily. 180 tablet 3  . omeprazole (PRILOSEC OTC) 20 MG tablet Take 20 mg by mouth at bedtime as needed  (GERD).     . OXYGEN Inhale 2-3 L into the lungs continuous.     . potassium chloride SA (K-DUR,KLOR-CON) 20 MEQ tablet Take 2 tablets (40 mEq total) by mouth 2 (two) times daily. 360 tablet 3  . Rivaroxaban (XARELTO) 15 MG TABS tablet Take 1 tablet (15 mg total) by mouth daily with supper. 30 tablet 0  . sertraline (ZOLOFT) 100 MG tablet Take 100 mg by mouth at bedtime.     . simvastatin (ZOCOR) 20 MG tablet Take 3 tablets (60 mg total) by mouth daily at 6 PM. 90 tablet 0  . spironolactone (ALDACTONE) 25 MG tablet Take 25  mg by mouth daily.    Marland Kitchen torsemide (DEMADEX) 20 MG tablet Take 2 tablets (40 mg total) by mouth 2 (two) times daily. 120 tablet 0  . NITROSTAT 0.4 MG SL tablet place 1 tablet under the tongue if needed every 5 minutes for chest pain for 3 doses IF NO RELIEF AFTER 3RD DOSE CALL PRESCRIBER OR 911. (Patient not taking: Reported on 06/27/2016) 25 tablet 0   No current facility-administered medications for this encounter.     Vitals:   06/27/16 1051  BP: (!) 136/54  Pulse: (!) 55  SpO2: 100%  Weight: 183 lb 9.6 oz (83.3 kg)   Wt Readings from Last 3 Encounters:  06/27/16 183 lb 9.6 oz (83.3 kg)  06/18/16 175 lb 12.8 oz (79.7 kg)  06/06/16 180 lb 8 oz (81.9 kg)     PHYSICAL EXAM:  General:  NAD, wearing oxygen. Ambulated in the clinic with a rolling walker. Son present  HEENT: normal Neck: supple. JVP 6-7. Carotids 2+ bilaterally; no bruits. No thyromegaly or nodule noted.  Cor: PMI normal. RRR. No rubs, gallops. 1/6 SEM RUSB.   Lungs: Distant breath sounds bilaterally. On 2 liters [place] Abdomen: soft, NT, ND, no HSM. No bruits or masses. +BS  Extremities: no cyanosis, clubbing.  No edema.   Neuro: alert & orientedx3, cranial nerves grossly intact. Moves all 4 extremities w/o difficulty. Affect pleasant.  ASSESSMENT & PLAN: 1. Chronic diastolic CHF/valvular heart disease: TEE in 3/17 showed normal EF, mild to moderate mitral stenosis.  Most recent RHC in 7/17 showed  near-normal filling pressures.  Today, NYHA IIIb symptoms.  Volume status stable. Continue torsemide 40 mg twice a day. Suspect that a lot of her dyspnea at this point is from COPD.   - Continue 25 mg spironolactone daily.  - BMET/BNP today.   2. CAD S/P CABG:  She still has episodes of exertional dyspnea.  She has potential source of angina from 90% stenosis in distal LCx.  The vessel is small at this point and not amenable to PCI.  - Continue statin and metoprolol.  - On Xarelto so no aspirin.  - Continue Imdur to 90 mg daily.  3. Atrial fibrillation: Paroxysmal. CHADSVASC score is 4 (age > 64, female, HTN, CHF).  S/P DC-CV 06/12/16. She remains in NSR. Decrease amio to 200 mg once a day.  - Continue Xarelto/metoprolol.  4. Pulmonary HTN: This is primarily pulmonary venous hypertension based on most recent RHC. 5. COPD: Followed by Dr Maple Hudson. PFTs in 2/17 with moderate to severe obstruction.  On 2 liters [place].   6.  OSA: intolerant CPAP 7.  Mitral stenosis: Rheumatic mitral stenosis.  Mild to moderate mitral stenosis by TEE in 3/17.  Unable to assess by cath in 3/17 because she had aortic valve vegetation and the valve was not crossed. Unable to assess by cath in 7/17 because she had transient complete heart block when the LV was entered. Treating medically for now.  Would be high risk for surgery with severe COPD.  However, possibly could be valvuloplasty candidate.    8. Aortic valve vegetation: Noted on TEE in 3/17 done to assess mitral stenosis.  No fever, blood cultures negative.  May have come from prior dental infection.  ID did not recommend antibiotics.    Follow up in 6 weeks.  Adreyan Carbajal NP-C  06/27/2016

## 2016-06-28 ENCOUNTER — Other Ambulatory Visit (HOSPITAL_COMMUNITY): Payer: Self-pay | Admitting: Pharmacist

## 2016-06-28 MED ORDER — AMIODARONE HCL 200 MG PO TABS: 200.0000 mg | ORAL_TABLET | Freq: Every day | ORAL | 6 refills | Status: DC

## 2016-07-13 ENCOUNTER — Other Ambulatory Visit (HOSPITAL_COMMUNITY): Payer: Self-pay | Admitting: *Deleted

## 2016-07-13 MED ORDER — RIVAROXABAN 15 MG PO TABS: 15.0000 mg | ORAL_TABLET | Freq: Every day | ORAL | 3 refills | Status: DC

## 2016-07-13 MED ORDER — SIMVASTATIN 20 MG PO TABS: 60.0000 mg | ORAL_TABLET | Freq: Every day | ORAL | 3 refills | Status: DC

## 2016-07-13 MED ORDER — LISINOPRIL 2.5 MG PO TABS: 2.5000 mg | ORAL_TABLET | Freq: Every day | ORAL | 3 refills | Status: DC

## 2016-07-13 MED ORDER — SPIRONOLACTONE 25 MG PO TABS
25.0000 mg | ORAL_TABLET | Freq: Every day | ORAL | 3 refills | Status: DC
Start: 2016-07-13 — End: 2017-05-26

## 2016-07-13 NOTE — Telephone Encounter (Signed)
Patient called asking for refills to be sent to [facility] for Cleda Daub, zocor, xarelto, and lisinopril but needed samples of Xarelto because she is now in the donut hole.  Refills sent and Xarelto samples placed at front desk for patient to pick up tomorrow.   Medication Samples have been provided to the patient.  Drug name: Xarelto     Strength: 15mg        Qty: 2 Bottles of 7 Tablets  LOT: [id number] Exp.Date: 01-20  Dosing instructions: 1 Tablet Daily  The patient has been instructed regarding the correct time, dose, and frequency of taking this medication, including desired effects and most common side effects.   Suezanne Cheshire 3:47 PM 07/13/2016

## 2016-07-14 ENCOUNTER — Other Ambulatory Visit (HOSPITAL_COMMUNITY): Payer: Self-pay | Admitting: *Deleted

## 2016-08-04 ENCOUNTER — Telehealth (HOSPITAL_COMMUNITY): Payer: Self-pay | Admitting: Pharmacist

## 2016-08-04 NOTE — Telephone Encounter (Signed)
Simvastatin 60 mg (#3 tabs of 20 mg) daily PA approved by [facility] through 07/24/17.   Tyler Deis. [name], PharmD, BCPS, CPP Clinical Pharmacist Pager: [phone] Phone: [phone] 08/04/2016 1:33 PM

## 2016-08-11 ENCOUNTER — Inpatient Hospital Stay (HOSPITAL_COMMUNITY): Admission: RE | Admit: 2016-08-11 | Payer: Medicare Other | Source: Ambulatory Visit

## 2016-08-12 ENCOUNTER — Encounter (HOSPITAL_COMMUNITY): Payer: Medicare Other

## 2016-08-22 ENCOUNTER — Ambulatory Visit (HOSPITAL_COMMUNITY)
Admission: RE | Admit: 2016-08-22 | Discharge: 2016-08-22 | Disposition: A | Discharge status: Home / Self Care | Payer: Medicare Other | Source: Ambulatory Visit | Attending: Internal Medicine | Admitting: Internal Medicine

## 2016-08-22 VITALS — BP 112/52 | HR 59 | Wt 186.0 lb

## 2016-08-22 DIAGNOSIS — F419 Anxiety disorder, unspecified: Secondary | ICD-10-CM | POA: Diagnosis not present

## 2016-08-22 DIAGNOSIS — F329 Major depressive disorder, single episode, unspecified: Secondary | ICD-10-CM | POA: Diagnosis not present

## 2016-08-22 DIAGNOSIS — I25119 Atherosclerotic heart disease of native coronary artery with unspecified angina pectoris: Secondary | ICD-10-CM | POA: Diagnosis not present

## 2016-08-22 DIAGNOSIS — G4733 Obstructive sleep apnea (adult) (pediatric): Secondary | ICD-10-CM | POA: Insufficient documentation

## 2016-08-22 DIAGNOSIS — K449 Diaphragmatic hernia without obstruction or gangrene: Secondary | ICD-10-CM | POA: Insufficient documentation

## 2016-08-22 DIAGNOSIS — K219 Gastro-esophageal reflux disease without esophagitis: Secondary | ICD-10-CM | POA: Diagnosis not present

## 2016-08-22 DIAGNOSIS — E785 Hyperlipidemia, unspecified: Secondary | ICD-10-CM | POA: Insufficient documentation

## 2016-08-22 DIAGNOSIS — Z9889 Other specified postprocedural states: Secondary | ICD-10-CM | POA: Diagnosis not present

## 2016-08-22 DIAGNOSIS — J449 Chronic obstructive pulmonary disease, unspecified: Secondary | ICD-10-CM | POA: Insufficient documentation

## 2016-08-22 DIAGNOSIS — Z825 Family history of asthma and other chronic lower respiratory diseases: Secondary | ICD-10-CM | POA: Diagnosis not present

## 2016-08-22 DIAGNOSIS — I480 Paroxysmal atrial fibrillation: Secondary | ICD-10-CM | POA: Diagnosis not present

## 2016-08-22 DIAGNOSIS — I5032 Chronic diastolic (congestive) heart failure: Secondary | ICD-10-CM | POA: Diagnosis present

## 2016-08-22 DIAGNOSIS — I330 Acute and subacute infective endocarditis: Secondary | ICD-10-CM | POA: Insufficient documentation

## 2016-08-22 DIAGNOSIS — Z800 Family history of malignant neoplasm of digestive organs: Secondary | ICD-10-CM | POA: Diagnosis not present

## 2016-08-22 DIAGNOSIS — Q211 Atrial septal defect: Secondary | ICD-10-CM | POA: Diagnosis not present

## 2016-08-22 DIAGNOSIS — Z951 Presence of aortocoronary bypass graft: Secondary | ICD-10-CM | POA: Insufficient documentation

## 2016-08-22 DIAGNOSIS — Z7901 Long term (current) use of anticoagulants: Secondary | ICD-10-CM | POA: Diagnosis not present

## 2016-08-22 DIAGNOSIS — Z8711 Personal history of peptic ulcer disease: Secondary | ICD-10-CM | POA: Diagnosis not present

## 2016-08-22 DIAGNOSIS — I050 Rheumatic mitral stenosis: Secondary | ICD-10-CM | POA: Diagnosis present

## 2016-08-22 DIAGNOSIS — I272 Pulmonary hypertension, unspecified: Secondary | ICD-10-CM | POA: Diagnosis not present

## 2016-08-22 DIAGNOSIS — Z9981 Dependence on supplemental oxygen: Secondary | ICD-10-CM | POA: Diagnosis not present

## 2016-08-22 DIAGNOSIS — F17210 Nicotine dependence, cigarettes, uncomplicated: Secondary | ICD-10-CM | POA: Insufficient documentation

## 2016-08-22 DIAGNOSIS — I110 Hypertensive heart disease with heart failure: Secondary | ICD-10-CM | POA: Insufficient documentation

## 2016-08-22 DIAGNOSIS — Z79899 Other long term (current) drug therapy: Secondary | ICD-10-CM | POA: Diagnosis not present

## 2016-08-22 LAB — BASIC METABOLIC PANEL
ANION GAP: 10 (ref 5–15)
BUN: 29 mg/dL — ABNORMAL HIGH (ref 6–20)
CO2: 35 mmol/L — ABNORMAL HIGH (ref 22–32)
Calcium: 9.6 mg/dL (ref 8.9–10.3)
Chloride: 95 mmol/L — ABNORMAL LOW (ref 101–111)
Creatinine, Ser: 1.51 mg/dL — ABNORMAL HIGH (ref 0.44–1.00)
GFR calc Af Amer: 38 mL/min — ABNORMAL LOW (ref >60)
GFR, EST NON AFRICAN AMERICAN: 32 mL/min — AB (ref >60)
GLUCOSE: 89 mg/dL (ref 65–99)
POTASSIUM: 4.2 mmol/L (ref 3.5–5.1)
Sodium: 140 mmol/L (ref 135–145)

## 2016-08-22 MED ORDER — TORSEMIDE 20 MG PO TABS: ORAL_TABLET | ORAL | 3 refills | Status: DC

## 2016-08-22 NOTE — Patient Instructions (Signed)
Continue taking Torsemide 60 mg (3 tabs) in am and 40 mg (2 tabs) in pm. Rx updated to you pharmacy.  Xarelto samples given today. Will have Erika Nicolsen Heart Failure Pharmacist contact you to discuss affordable options.  Follow up 2 months with Dr. Shirlee Latch.  Do the following things EVERYDAY: 1) Weigh yourself in the morning before breakfast. Write it down and keep it in a log. 2) Take your medicines as prescribed 3) Eat low salt foods-Limit salt (sodium) to 2000 mg per day.  4) Stay as active as you can everyday 5) Limit all fluids for the day to less than 2 liters

## 2016-08-22 NOTE — Progress Notes (Signed)
Patient ID: EDWENA Lloyd, female   DOB: Feb 26, 1941, 76 y.o.   MRN: [id number]    Advanced Heart Failure Clinic Note   PCP: Dr Valentina Lucks  Pulmonary: Dr Maple Hudson HF Cardiology: Dr. Shirlee Latch  HPI: [name] is a 76 y.o. female with a history of chronic diastolic CHF, moderate pulmonary HTN, HTN, CAD s/p CABG, COPD on home oxygen, and dyslipidemia referred to the HF clinic by Dr Mayford Knife.    Admitted 3/2 - 09/30/15 with A/C CHF. TEE was done to assess mitral stenosis, appeared mild to moderate, rheumatic valve.  TEE also showed aortic valve vegetation and concern arose for endocarditis with recent "tooth infection" per pt.  ID saw. 1 of 2 admission cultures with Gram + cocci in clusters thought to be contaminant.  2 further sets of BCx negative. RHC showed elevated right and left heart filling pressures.  Aortic valve was not crossed for full mitral stenosis assessment due to aortic valve vegetation. Diuresed 8 lbs. Discharge weight 175 lbs.   Due to increased dyspnea and exertional chest pain, she had RHC/LHC in 7/17.  Bypass grafts were patent but there was 90% stenosis in the distal LCx that could account for angina.  It was a small caliber vessel and not amenable to PCI. Filling pressures were near-normal on RHC.   Admitted 11/18 through 06/18/16 with chest pain and increased dyspnea. Exacerbation was thought to be from A fib RVR. She underwent successful DC/CV on 11/19. Diuresed with IV lasix and transitioned to  Discharged on amio 200 mg twice a day+ xarelto 15 mg daily. Discharge weight was 175 pounds.     She returns for HF follow up. Overall feeling ok but complaining of nasal drainage. Remains SOB with exertion. Denies PND/Orthopnea. Appetite ok. Denies CP. Continues on 2 liters oxygen. Ambulates with walker. Weight at home 183 pounds. Taking all medications. Son lives her. Says she cant afford Xarelto.   - ECHO 12/2014 EF 55-60%, peak PA pressure 68 mmHg, mild to moderate MS. - ECHO 2/17 EF  60-65%, mild LVH, mildly dilated RV with mildly decreased systolic function, PASP 29 mmHg (not sure we got a full envelope), moderate mitral stenosis with mean gradient 6 mmHg and MVA 1.37 cm^2, mild MR, mild AI, aortic sclerosis without significant stenosis.   - TEE 09/24/15 EF 60%, small 0.8 cm mobile mass on the LV side of the aortic valve. Mild AI and AS. 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 A small PFO with a weakly positive bubble study, Moderate TR  PFTs (2/17): moderate-severe obstructive airways disease.   Sleep study 2017 was negative, no significant OSA.   RHC 09/24/15 (did not do LHC and cross valve to assess mitral stenosis due to aortic valve vegetation): Hemodynamics (mmHg) RA mean 16 RV 65/14 PA 71/28, mean 45 PCWP mean 32 Cardiac Output (Fick) 4.85  Cardiac Index (Fick) 2.51 PVR 2.68 WU  LHC/RHC (7/17): 50% pLAD, patent LIMA-LAD, totally occluded PLOM, patent SVG-PLOM, AV LCx distal to PLOM with 90% stenosis (small caliber), totally occluded PLV, patent SVG-PLV.  RA mean 5 PA 43/13 PCWP mean 17 CI 2.7 PVR 1.9 WU Transient CHB when LV was entered, so unable to measure gradient across MV.   Labs 01/09/2015 K 4.3 Creatinine 0.91 BNP  Labs 02/19/2015 K 4.1 Creatinine 1.03 Labs 2/17 LDL 82 Labs 10/05/15 K 3.8, Creatinine 0.89  Labs 10/08/15 K 4.6, Creatinine 0.7 Labs 6/17 K 4.3, creatinine 0.95, HCT  32.7, BNP 221 Labs 7/17 BNP 286, K 4.2, creatinine 1.0, HCT 31.2 Labs 10/17 K 3.5, creatinine 1.18, LDL 78  Past Medical History:  Diagnosis Date  . Acute parotitis 01/05/2016  . Anxiety   . Bacteremia, escherichia coli   . Bursitis, shoulder    bilateral  . CHF (congestive heart failure) (HCC)    chronic diasotlic CHF  . Complication of anesthesia    "have trouble getting me awake sometimes; been ok latelhy" (09/24/2015)  . COPD (chronic obstructive pulmonary disease) (HCC)   . Coronary heart disease 2001   s/p CABG  .  Depression   . DJD (degenerative joint disease)   . GERD (gastroesophageal reflux disease)   . H/O hiatal hernia   . History of rheumatic heart disease    X 3-LAST TIME WHEN PATIENT WAS 76 YRS OLD  . Hyperlipidemia   . Hypertension   . OA (osteoarthritis)   . On home oxygen therapy    "2L; 24/7" (09/24/2015)  . PUD (peptic ulcer disease)   . Pulmonary HTN    PASP 50-74mmHg by [id number]  . Pulmonary HTN   . Sleep apnea    intolerant to CPAP  . Thyroid nodule   . Urinary incontinence   . Varicose veins     Social History: The patient  reports that she quit smoking about 7 years ago. Her smoking use included Cigarettes. She has a 47 pack-year smoking history. She has never used smokeless tobacco. She reports that she does not drink alcohol or use illicit drugs.   Family History: The patient's family history includes Anemia in her mother; Emphysema in her father; Esophageal cancer in her daughter.   ROS: All systems negative except as listed in HPI, PMH and Problem List.  Current Outpatient Prescriptions  Medication Sig Dispense Refill  . acetaminophen (TYLENOL) 500 MG tablet Take 1,000 mg by mouth 2 (two) times daily as needed for headache.     Marland Kitchen amiodarone (PACERONE) 200 MG tablet Take 1 tablet (200 mg total) by mouth daily. 30 tablet 6  . calcium-vitamin D (OSCAL WITH D) 500-200 MG-UNIT per tablet Take 2 tablets by mouth daily with breakfast.    . IRON PO Take 1 tablet by mouth daily.    . isosorbide mononitrate (IMDUR) 60 MG 24 hr tablet Take 1 tablet (60 mg total) by mouth daily. 30 tablet 0  . lisinopril (PRINIVIL,ZESTRIL) 2.5 MG tablet Take 1 tablet (2.5 mg total) by mouth daily. 90 tablet 3  . loratadine (CLARITIN) 10 MG tablet Take 10 mg by mouth at bedtime as needed for allergies.     . metoprolol (LOPRESSOR) 50 MG tablet Take 1 tablet (50 mg total) by mouth 2 (two) times daily. 180 tablet 3  . NITROSTAT 0.4 MG SL tablet place 1 tablet under the tongue if needed every 5  minutes for chest pain for 3 doses IF NO RELIEF AFTER 3RD DOSE CALL PRESCRIBER OR 911. 25 tablet 0  . omeprazole (PRILOSEC OTC) 20 MG tablet Take 20 mg by mouth at bedtime as needed (GERD).     . OXYGEN Inhale 2-3 L into the lungs continuous.     . potassium chloride SA (K-DUR,KLOR-CON) 20 MEQ tablet Take 2 tablets (40 mEq total) by mouth 2 (two) times daily. 360 tablet 3  . Rivaroxaban (XARELTO) 15 MG TABS tablet Take 1 tablet (15 mg total) by mouth daily with supper. 90 tablet 3  . sertraline (ZOLOFT) 100 MG tablet Take 100 mg by mouth  at bedtime.     . simvastatin (ZOCOR) 20 MG tablet Take 3 tablets (60 mg total) by mouth daily at 6 PM. 270 tablet 3  . spironolactone (ALDACTONE) 25 MG tablet Take 1 tablet (25 mg total) by mouth daily. 90 tablet 3  . torsemide (DEMADEX) 20 MG tablet Take 2 tablets (40 mg total) by mouth 2 (two) times daily. (Patient taking differently: Take 40 mg by mouth 2 (two) times daily. 60 mg in am and 40 mg in pm) 120 tablet 0   No current facility-administered medications for this encounter.     Vitals:   08/22/16 1105  BP: (!) 112/52  Pulse: (!) 59  SpO2: 98%  Weight: 186 lb (84.4 kg)   Wt Readings from Last 3 Encounters:  08/22/16 186 lb (84.4 kg)  06/27/16 183 lb 9.6 oz (83.3 kg)  06/18/16 175 lb 12.8 oz (79.7 kg)     PHYSICAL EXAM:  General:  NAD, on oxygen 2 liters nasal cannula. Arrived in a wheel chair. Granddaughter present.   HEENT: normal Neck: supple. JVP 5-6. Carotids 2+ bilaterally; no bruits. No thyromegaly or nodule noted.  Cor: PMI normal. RRR. No rubs, gallops. 1/6 SEM RUSB.   Lungs: CTAB. On 2 liters oxygen.  Abdomen: soft , NT. ND. no HSM. No bruits or masses. +BS  Extremities: no cyanosis, clubbing.  No edema. Warm.    Neuro: alert & orientedx3, cranial nerves grossly intact. Moves all 4 extremities w/o difficulty. Affect pleasant.  ASSESSMENT & PLAN: 1. Chronic diastolic CHF/valvular heart disease: TEE in 3/17 showed normal EF,  mild to moderate mitral stenosis.  Most recent RHC in 7/17 showed near-normal filling pressures.  Today, NYHA IIIb symptoms.  Volume status stable. Continue torsemide 60 mg in am and 40 mg in pm. May need to decrease if renal function elevated.  - Continue 25 mg spironolactone daily. - Do the following things EVERYDAY: 1) Weigh yourself in the morning before breakfast. Write it down and keep it in a log. 2) Take your medicines as prescribed 3) Eat low salt foods-Limit salt (sodium) to 2000 mg per day.  4) Stay as active as you can everyday 5) Limit all fluids for the day to less than 2 liters   2. CAD S/P CABG:  She still has episodes of exertional dyspnea.  She has potential source of angina from 90% stenosis in distal LCx.  The vessel is small at this point and not amenable to PCI.  - No CP. Continue current dose of statin and BB.  On Xarelto so no Aspirin.  HF pharmacy to check on assistance for xarelto.  - Continue Imdur to 90 mg daily.  3. Atrial fibrillation: Paroxysmal. CHADSVASC score is 4 (age > 85, female, HTN, CHF).  S/P DC-CV 06/12/16. Regular pulse. Continue amio to 200 mg once a day. WIll need to check TSH, LFTS next visit. She understands she should get get yearly eye exam.  - Continue Xarelto/metoprolol.  4. Pulmonary HTN: This is primarily pulmonary venous hypertension based on most recent RHC. 5. COPD: Followed by Dr Maple Hudson. PFTs in 2/17 with moderate to severe obstruction.  On 2 liters [place].   6.  OSA: intolerant CPAP 7.  Mitral stenosis: Rheumatic mitral stenosis.  Mild to moderate mitral stenosis by TEE in 3/17.  Unable to assess by cath in 3/17 because she had aortic valve vegetation and the valve was not crossed. Unable to assess by cath in 7/17 because she had transient complete heart block  when the LV was entered. Treating medically for now.  Would be high risk for surgery with severe COPD.  However, possibly could be valvuloplasty candidate.    8. Aortic valve vegetation:  Noted on TEE in 3/17 done to assess mitral stenosis.  No fever, blood cultures negative.  May have come from prior dental infection.  ID did not recommend antibiotics.    Follow up in 8 with Dr Shirlee Latch.   Maisen Schmit NP-C  08/22/2016

## 2016-08-23 ENCOUNTER — Telehealth: Payer: Self-pay | Admitting: Internal Medicine

## 2016-08-23 NOTE — Telephone Encounter (Signed)
Spoke with pt. She has been scheduled to see TP on 08/24/16 at 9am. Nothing further was needed.

## 2016-08-24 ENCOUNTER — Ambulatory Visit (INDEPENDENT_AMBULATORY_CARE_PROVIDER_SITE_OTHER): Payer: Medicare Other | Admitting: Adult Health

## 2016-08-24 ENCOUNTER — Other Ambulatory Visit (INDEPENDENT_AMBULATORY_CARE_PROVIDER_SITE_OTHER): Payer: Medicare Other

## 2016-08-24 ENCOUNTER — Encounter: Payer: Self-pay | Admitting: Adult Health

## 2016-08-24 VITALS — BP 122/72 | HR 58 | Temp 97.9°F | Ht 66.0 in | Wt 188.2 lb

## 2016-08-24 DIAGNOSIS — J449 Chronic obstructive pulmonary disease, unspecified: Secondary | ICD-10-CM

## 2016-08-24 DIAGNOSIS — J441 Chronic obstructive pulmonary disease with (acute) exacerbation: Secondary | ICD-10-CM | POA: Diagnosis not present

## 2016-08-24 DIAGNOSIS — J301 Allergic rhinitis due to pollen: Secondary | ICD-10-CM | POA: Diagnosis not present

## 2016-08-24 DIAGNOSIS — I5032 Chronic diastolic (congestive) heart failure: Secondary | ICD-10-CM | POA: Diagnosis not present

## 2016-08-24 DIAGNOSIS — J9611 Chronic respiratory failure with hypoxia: Secondary | ICD-10-CM

## 2016-08-24 LAB — SEDIMENTATION RATE: SED RATE: 24 mm/h (ref 0–30)

## 2016-08-24 MED ORDER — IPRATROPIUM BROMIDE 0.02 % IN SOLN: 0.5000 mg | Freq: Four times a day (QID) | RESPIRATORY_TRACT | 12 refills | Status: DC | PRN

## 2016-08-24 MED ORDER — BENZONATATE 200 MG PO CAPS: 200.0000 mg | ORAL_CAPSULE | Freq: Three times a day (TID) | ORAL | 3 refills | Status: DC | PRN

## 2016-08-24 NOTE — Assessment & Plan Note (Signed)
Need to check CHF clinic to see if ACE can be changed to ARB .

## 2016-08-24 NOTE — Assessment & Plan Note (Signed)
Moderate to Severe COPD with mild flare /chronic cough ? ACE /GERD/AR triggers Stable PFT in 2017 . CXR 05/2016 stable  She is on amiodarone . -check ESR   Plan  Patient Instructions  Change to Claritin to Zyrtec 9m At bedtime  .  Begin Delsym 2 tsp Twice daily  For cough As needed   USe Tessalon Three times a day  For cough As needed   Continue on Prilosec 28mdaily in am .  Start Pepcid 2097mt bedtime   Labs today .  Please call cardiology and let them know that lisinopril may be causing your cough to be worse and can they change this medication.  Begin Ipratropium Neb Three times a day  . Rinse after use.  Follow up in  2 months with PFT .  Please contact office for sooner follow up if symptoms do not improve or worsen or seek emergency care

## 2016-08-24 NOTE — Patient Instructions (Addendum)
Change to Claritin to Zyrtec 10mg  At bedtime  .  Begin Delsym 2 tsp Twice daily  For cough As needed   USe Tessalon Three times a day  For cough As needed   Continue on Prilosec 20mg  daily in am .  Start Pepcid 20mg  At bedtime   Labs today .  Please call cardiology and let them know that lisinopril may be causing your cough to be worse and can they change this medication.  Begin Ipratropium Neb Three times a day  . Rinse after use.  Follow up in  2 months with PFT .  Please contact office for sooner follow up if symptoms do not improve or worsen or seek emergency care

## 2016-08-24 NOTE — Assessment & Plan Note (Signed)
Change to zyrtec

## 2016-08-24 NOTE — Progress Notes (Signed)
_0  ID: [name], female    DOB: [birth year], 76 y.o.   MRN: [id number]  Chief Complaint  Patient presents with  . Acute Visit    COPD     Referring provider: Kelton Pillar, MD  HPI: 76 yo former smoker with COPD, sleep apnea, complicated by pulmonary HTN, on O2  Followed by cards for Valvular heart dz, A Fib , Pulm HTN, CAD   TEST  - ECHO 12/2014 EF 55-60%, peak PA pressure 68 mmHg, mild to moderate MS. - ECHO 2/17 EF 60-65%, mild LVH, mildly dilated RV with mildly decreased systolic function, PASP 29 mmHg (not sure we got a full envelope), moderate mitral stenosis with mean gradient 6 mmHg and MVA 1.37 cm^2, mild MR, mild AI, aortic sclerosis without significant stenosis.  - TEE 09/24/15 EF 60%, small 0.8 cm mobile mass on the LV side of the aortic valve. Mild AI and AS. 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 A small PFO with a weakly positive bubble study, Moderate TR -Pulmonary function test 09/18/2015 showed severe COPD with FEV1 at 56%, ratio 72, FVC 56%, DLCO 96% -Sleep study on 02/22/16 showed no OSA with AHI 0. Severe PLM .  08/24/2016 Acute OV  Presents for an acute office visit. She complains of 77-5 months of dry cough that is minimally productive.   Fumes , change in temp aggravate her cough . She coughs so hard , eyes water and has drippy nose. Feels she has a tickle in her throat . Takes claritin and tessalon with no help.   She has Mixed COPD Dz , has tried several inhalers in past but says she can not tolerate. She says she took neb tx in hospital before that really helped and she could tolerate.   Remains on O2 at 2l/m.   Patient is on ACE inhibitor low dose for years. She is managed at CHF clinic . She has been on this for years. She is also on Amiodarone . PFT 2017 showed nml DLCO 96%.      Allergies  Allergen Reactions  . Prednisone Rash    jittery  . Tape Other (See Comments)    Bleeding  .  Xopenex [Levalbuterol] Other (See Comments)    Made mouth and throat sore  . Cefpodoxime Other (See Comments)    Yeast infections   . Codeine Other (See Comments)    Anxious/ jittery  . Lipitor [Atorvastatin] Other (See Comments)    Made joint start hurting/ Muscle aches  . Other Swelling    Venholin HFA  . Penicillins Other (See Comments)    Has patient had a PCN reaction causing immediate rash, facial/tongue/throat swelling, SOB or lightheadedness with hypotension: unknown, childhood reaction Has patient had a PCN reaction causing severe rash involving mucus membranes or skin necrosis: No Has patient had a PCN reaction that required hospitalization No Has patient had a PCN reaction occurring within the last 10 years: No If all of the above answers are "NO", then may proceed with Cephalosporin use.   Enid Cutter [Kdc:Albuterol] Other (See Comments)    "jittery"  . Anoro Ellipta [Umeclidinium-Vilanterol]     itchy  . Clindamycin Other (See Comments)    Unknown   . Hydrocodone-Acetaminophen Anxiety  . Latex Itching and Rash  . Levalbuterol Hcl Other (See Comments)    Unknown     Immunization History  Administered Date(s) Administered  . Influenza Split 04/03/2012, 04/15/2014  .  Influenza Whole 04/01/2011  . Influenza, High Dose Seasonal PF 04/18/2016  . Influenza,inj,Quad PF,36+ Mos 05/09/2013, 04/16/2015  . Pneumococcal Conjugate-13 06/24/2014  . Pneumococcal Polysaccharide-23 07/25/2008  . Tdap 05/25/2012    Past Medical History:  Diagnosis Date  . Acute parotitis 01/05/2016  . Anxiety   . Bacteremia, escherichia coli   . Bursitis, shoulder    bilateral  . CHF (congestive heart failure) (HCC)    chronic diasotlic CHF  . Complication of anesthesia    "have trouble getting me awake sometimes; been ok latelhy" (77/08/2015)  . COPD (chronic obstructive pulmonary disease) ([place])   . Coronary heart disease 2001   s/p CABG  . Depression   . DJD (degenerative joint disease)    . GERD (gastroesophageal reflux disease)   . H/O hiatal hernia   . History of rheumatic heart disease    X 3-LAST TIME WHEN PATIENT WAS 76 YRS OLD  . Hyperlipidemia   . Hypertension   . OA (osteoarthritis)   . On home oxygen therapy    "2L; 24/7" (77/08/2015)  . PUD (peptic ulcer disease)   . Pulmonary HTN    PASP 50-61mHg by [id number] . Pulmonary HTN   . Sleep apnea    intolerant to CPAP  . Thyroid nodule   . Urinary incontinence   . Varicose veins     Tobacco History: History  Smoking Status  . Former Smoker  . Packs/day: 1.00  . Years: 47.00  . Types: Cigarettes  . Quit date: 07/26/2007  Smokeless Tobacco  . Never Used   Counseling given: Not Answered   Outpatient Encounter Prescriptions as of 08/24/2016  Medication Sig  . acetaminophen (TYLENOL) 500 MG tablet Take 1,000 mg by mouth 2 (two) times daily as needed for headache.   .Marland Kitchenamiodarone (PACERONE) 200 MG tablet Take 1 tablet (200 mg total) by mouth daily.  . benzonatate (TESSALON) 200 MG capsule Take 1 capsule (200 mg total) by mouth 3 (three) times daily as needed for cough. As needed  . calcium-vitamin D (OSCAL WITH D) 500-200 MG-UNIT per tablet Take 2 tablets by mouth daily with breakfast.  . IRON PO Take 1 tablet by mouth daily.  . isosorbide mononitrate (IMDUR) 60 MG 24 hr tablet Take 1 tablet (60 mg total) by mouth daily.  .Marland Kitchenlisinopril (PRINIVIL,ZESTRIL) 2.5 MG tablet Take 1 tablet (2.5 mg total) by mouth daily.  . metoprolol (LOPRESSOR) 50 MG tablet Take 1 tablet (50 mg total) by mouth 2 (two) times daily.  .Marland KitchenNITROSTAT 0.4 MG SL tablet place 1 tablet under the tongue if needed every 5 minutes for chest pain for 3 doses IF NO RELIEF AFTER 3RD DOSE CALL PRESCRIBER OR 911.  . omeprazole (PRILOSEC OTC) 20 MG tablet Take 20 mg by mouth at bedtime as needed (GERD).   . OXYGEN Inhale 2-3 L into the lungs continuous.   . potassium chloride SA (K-DUR,KLOR-CON) 20 MEQ tablet Take 2 tablets (40 mEq total) by mouth 2  (two) times daily.  . Rivaroxaban (XARELTO) 15 MG TABS tablet Take 1 tablet (15 mg total) by mouth daily with supper.  . sertraline (ZOLOFT) 100 MG tablet Take 100 mg by mouth at bedtime.   . simvastatin (ZOCOR) 20 MG tablet Take 3 tablets (60 mg total) by mouth daily at 6 PM.  . spironolactone (ALDACTONE) 25 MG tablet Take 1 tablet (25 mg total) by mouth daily.  .Marland Kitchentorsemide (DEMADEX) 20 MG tablet Take 60 mg (3 tabs) in am and  40 mg (2 tabs) in pm.  . [DISCONTINUED] benzonatate (TESSALON) 200 MG capsule As needed  . ipratropium (ATROVENT) 0.02 % nebulizer solution Take 2.5 mLs (0.5 mg total) by nebulization every 6 (six) hours as needed for wheezing or shortness of breath.  . loratadine (CLARITIN) 10 MG tablet Take 10 mg by mouth at bedtime as needed for allergies.    No facility-administered encounter medications on file as of 08/24/2016.      Review of Systems  Constitutional:   No  weight loss, night sweats,  Fevers, chills,  +fatigue, or  lassitude.  HEENT:   No headaches,  Difficulty swallowing,  Tooth/dental problems, or  Sore throat,                No sneezing, itching, ear ache,  +nasal congestion, post nasal drip,   CV:  No chest pain,  Orthopnea, PND, swelling in lower extremities, anasarca, dizziness, palpitations, syncope.   GI  No heartburn, indigestion, abdominal pain, nausea, vomiting, diarrhea, change in bowel habits, loss of appetite, bloody stools.   Resp:  .  No wheezing.  No chest wall deformity  Skin: no rash or lesions.  GU: no dysuria, change in color of urine, no urgency or frequency.  No flank pain, no hematuria   MS:  No joint pain or swelling.  No decreased range of motion.  No back pain.    Physical Exam  BP 122/72 (BP Location: Left Arm, Cuff Size: Normal)   Pulse (!) 58   Temp 97.9 F (36.6 C) (Oral)   Ht 5' 6" (1.676 m)   Wt 188 lb 3.2 oz (85.4 kg)   SpO2 96%   BMI 30.38 kg/m   GEN: A/Ox3; pleasant , NAD, elderly on o2    HEENT:  [place]/AT,   EACs-clear, TMs-wnl, NOSE-clear drainage THROAT-clear, no lesions, no postnasal drip or exudate noted.   NECK:  Supple w/ fair ROM; no JVD; normal carotid impulses w/o bruits; no thyromegaly or nodules palpated; no lymphadenopathy.    RESP  Clear  P & A; w/o, wheezes/ rales/ or rhonchi. no accessory muscle use, no dullness to percussion  CARD:  RRR, no m/r/g, no peripheral edema, pulses intact, no cyanosis or clubbing.  GI:   Soft & nt; nml bowel sounds; no organomegaly or masses detected.   Musco: Warm bil, no deformities or joint swelling noted.   Neuro: alert, no focal deficits noted.    Skin: Warm, no lesions or rashes  Psych:  No change in mood or affect. No depression or anxiety.  No memory loss.  Lab Results:   BNP  Imaging: No results found.   Assessment & Plan:   COPD mixed type ([place]) Moderate to Severe COPD with mild flare /chronic cough ? ACE /GERD/AR triggers Stable PFT in 2017 . CXR 05/2016 stable  She is on amiodarone . -check ESR   Plan  Patient Instructions  Change to Claritin to Zyrtec 16m At bedtime  .  Begin Delsym 2 tsp Twice daily  For cough As needed   USe Tessalon Three times a day  For cough As needed   Continue on Prilosec 273mdaily in am .  Start Pepcid 2027mt bedtime   Labs today .  Please call cardiology and let them know that lisinopril may be causing your cough to be worse and can they change this medication.  Begin Ipratropium Neb Three times a day  . Rinse after use.  Follow up in  2 months  with PFT .  Please contact office for sooner follow up if symptoms do not improve or worsen or seek emergency care      Seasonal allergic rhinitis Change to zyrtec   Chronic diastolic heart failure ([place]) Need to check CHF clinic to see if ACE can be changed to ARB .   Chronic respiratory failure with hypoxia (HCC) Cont on O2      Patsy Zaragoza, NP 08/24/2016

## 2016-08-24 NOTE — Addendum Note (Signed)
Addended by: Boone Master E on: 08/24/2016 03:16 PM   Modules accepted: Orders

## 2016-08-24 NOTE — Assessment & Plan Note (Signed)
Cont on O2 .  

## 2016-08-25 ENCOUNTER — Telehealth (HOSPITAL_COMMUNITY): Payer: Self-pay | Admitting: Pharmacist

## 2016-08-25 ENCOUNTER — Telehealth (HOSPITAL_COMMUNITY): Payer: Self-pay | Admitting: *Deleted

## 2016-08-25 MED ORDER — LOSARTAN POTASSIUM 25 MG PO TABS: 12.5000 mg | ORAL_TABLET | Freq: Every day | ORAL | 0 refills | Status: DC

## 2016-08-25 MED ORDER — LOSARTAN POTASSIUM 25 MG PO TABS: 12.5000 mg | ORAL_TABLET | Freq: Every day | ORAL | 3 refills | Status: DC

## 2016-08-25 NOTE — Progress Notes (Signed)
Called spoke with patient, advised of lab results / recs as stated by TP.  Pt verbalized her understanding and denied any questions. 

## 2016-08-25 NOTE — Telephone Encounter (Signed)
Also allergy added to chart

## 2016-08-25 NOTE — Telephone Encounter (Signed)
Patient is aware of medication changes.  Medication list updated and prescription sent to pharmacy.      Suezanne Cheshire, RN  Laurey Morale, MD        Spoke with patient and is agreeable with plan. I have sent prescription to pharmacy and updated medication list.   Previous Messages    ----- Message -----  From: Laurey Morale, MD  Sent: 08/25/2016 11:42 AM  To: Suezanne Cheshire, RN  Subject: RE: Lisinopril reaction              Change to losartan 12.5 daily (stop lisinopril).   ----- Message -----  From: Suezanne Cheshire, RN  Sent: 08/25/2016 10:38 AM  To: Laurey Morale, MD  Subject: Lisinopril reaction                Patient started lisinopril 12/20. She is complaining of a persistent cough for the past month. Pulmonary told her to call and see if we can change this to a different medication. Please advise.

## 2016-08-25 NOTE — Telephone Encounter (Addendum)
[name] states that her Xarelto is still >$200/90 day supply through mail order. She was given a temporary supply from [facility] last year but she needs to apply for the Medicare LIS and if denied would be approved for free drug through 01/2017. I have given her the phone number for the LIS application [phone]) and asked her to provide me with a copy of the letter if denied.    ADDENDUM:  2/20 - [name] called stating she received the "informal" LIS denial letter in the mail. I have advised her to mail it to me but to still look out for the formal/final denial letter.   ADDENDUM: 3/8 - [name] was approved for continued assistance for her Xarelto through 02/04/17.   Tyler Deis. [name], PharmD, BCPS, CPP Clinical Pharmacist Pager: [phone] Phone: [phone] 08/25/2016 2:46 PM

## 2016-08-26 ENCOUNTER — Other Ambulatory Visit (HOSPITAL_COMMUNITY): Payer: Self-pay | Admitting: Pharmacist

## 2016-08-26 MED ORDER — BENZONATATE 200 MG PO CAPS: 200.0000 mg | ORAL_CAPSULE | Freq: Three times a day (TID) | ORAL | 0 refills | Status: DC | PRN

## 2016-08-31 ENCOUNTER — Telehealth: Payer: Self-pay | Admitting: Adult Health

## 2016-08-31 NOTE — Telephone Encounter (Signed)
I have checked TP's look at folders on Side B and up front. This form is not located in either location. I have left a message with [employer] to refax this to triage.

## 2016-08-31 NOTE — Telephone Encounter (Signed)
Waiting for fax from [employer] to complete and send back in. Pt is aware. Checked the fax machine up front and this has not come through yet.  Will check back.

## 2016-08-31 NOTE — Telephone Encounter (Signed)
Pt calling a/b a medical ness from for [employer] for her to get a neb machine and med please advise pt can be reached @ [phone].Caren Griffins

## 2016-09-01 NOTE — Telephone Encounter (Signed)
Form has been received. It has been placed in TP's look at. Will route to [facility] for follow up.

## 2016-09-01 NOTE — Telephone Encounter (Signed)
Spoke with Rod Holler at [facility]. She is aware that we have not received this form. Rod Holler is going to send this to triage fax. Will await fax.

## 2016-09-02 NOTE — Telephone Encounter (Signed)
Gilda with [employer] states she received CMN but it is the incorrect one, states the one they received did not have medication on it.  CB is [phone].

## 2016-09-02 NOTE — Telephone Encounter (Signed)
Spoke with Rod Holler at [facility]. She states that they don't need this CMN at this time. Nothing further was needed.

## 2016-10-04 ENCOUNTER — Encounter (HOSPITAL_COMMUNITY): Payer: Self-pay

## 2016-10-04 NOTE — Progress Notes (Signed)
Last OV note from CHF clinic (07/2016) faxed to [facility] office per request.  [name], RN

## 2016-10-21 ENCOUNTER — Encounter (HOSPITAL_COMMUNITY): Payer: Self-pay

## 2016-10-21 ENCOUNTER — Ambulatory Visit (HOSPITAL_COMMUNITY)
Admission: RE | Admit: 2016-10-21 | Discharge: 2016-10-21 | Disposition: A | Discharge status: Home / Self Care | Payer: Medicare Other | Source: Ambulatory Visit | Attending: Cardiology | Admitting: Cardiology

## 2016-10-21 VITALS — BP 150/75 | HR 57 | Wt 185.8 lb

## 2016-10-21 DIAGNOSIS — K449 Diaphragmatic hernia without obstruction or gangrene: Secondary | ICD-10-CM | POA: Diagnosis not present

## 2016-10-21 DIAGNOSIS — J449 Chronic obstructive pulmonary disease, unspecified: Secondary | ICD-10-CM | POA: Diagnosis not present

## 2016-10-21 DIAGNOSIS — E785 Hyperlipidemia, unspecified: Secondary | ICD-10-CM | POA: Insufficient documentation

## 2016-10-21 DIAGNOSIS — Z825 Family history of asthma and other chronic lower respiratory diseases: Secondary | ICD-10-CM | POA: Diagnosis not present

## 2016-10-21 DIAGNOSIS — F419 Anxiety disorder, unspecified: Secondary | ICD-10-CM | POA: Insufficient documentation

## 2016-10-21 DIAGNOSIS — I25119 Atherosclerotic heart disease of native coronary artery with unspecified angina pectoris: Secondary | ICD-10-CM | POA: Diagnosis not present

## 2016-10-21 DIAGNOSIS — Q211 Atrial septal defect: Secondary | ICD-10-CM | POA: Diagnosis not present

## 2016-10-21 DIAGNOSIS — Z951 Presence of aortocoronary bypass graft: Secondary | ICD-10-CM | POA: Diagnosis not present

## 2016-10-21 DIAGNOSIS — G4733 Obstructive sleep apnea (adult) (pediatric): Secondary | ICD-10-CM | POA: Insufficient documentation

## 2016-10-21 DIAGNOSIS — Z9981 Dependence on supplemental oxygen: Secondary | ICD-10-CM | POA: Insufficient documentation

## 2016-10-21 DIAGNOSIS — I330 Acute and subacute infective endocarditis: Secondary | ICD-10-CM | POA: Insufficient documentation

## 2016-10-21 DIAGNOSIS — I050 Rheumatic mitral stenosis: Secondary | ICD-10-CM | POA: Insufficient documentation

## 2016-10-21 DIAGNOSIS — Z8711 Personal history of peptic ulcer disease: Secondary | ICD-10-CM | POA: Diagnosis not present

## 2016-10-21 DIAGNOSIS — F329 Major depressive disorder, single episode, unspecified: Secondary | ICD-10-CM | POA: Diagnosis not present

## 2016-10-21 DIAGNOSIS — I272 Pulmonary hypertension, unspecified: Secondary | ICD-10-CM | POA: Diagnosis not present

## 2016-10-21 DIAGNOSIS — K219 Gastro-esophageal reflux disease without esophagitis: Secondary | ICD-10-CM | POA: Diagnosis not present

## 2016-10-21 DIAGNOSIS — F17210 Nicotine dependence, cigarettes, uncomplicated: Secondary | ICD-10-CM | POA: Insufficient documentation

## 2016-10-21 DIAGNOSIS — I5032 Chronic diastolic (congestive) heart failure: Secondary | ICD-10-CM | POA: Diagnosis not present

## 2016-10-21 DIAGNOSIS — I480 Paroxysmal atrial fibrillation: Secondary | ICD-10-CM | POA: Insufficient documentation

## 2016-10-21 DIAGNOSIS — Z79899 Other long term (current) drug therapy: Secondary | ICD-10-CM | POA: Diagnosis not present

## 2016-10-21 DIAGNOSIS — Z7901 Long term (current) use of anticoagulants: Secondary | ICD-10-CM | POA: Insufficient documentation

## 2016-10-21 DIAGNOSIS — Z800 Family history of malignant neoplasm of digestive organs: Secondary | ICD-10-CM | POA: Diagnosis not present

## 2016-10-21 DIAGNOSIS — I110 Hypertensive heart disease with heart failure: Secondary | ICD-10-CM | POA: Diagnosis not present

## 2016-10-21 DIAGNOSIS — Z832 Family history of diseases of the blood and blood-forming organs and certain disorders involving the immune mechanism: Secondary | ICD-10-CM | POA: Insufficient documentation

## 2016-10-21 LAB — BASIC METABOLIC PANEL
Anion gap: 10 (ref 5–15)
BUN: 26 mg/dL — AB (ref 6–20)
CALCIUM: 10 mg/dL (ref 8.9–10.3)
CHLORIDE: 92 mmol/L — AB (ref 101–111)
CO2: 36 mmol/L — AB (ref 22–32)
CREATININE: 1.54 mg/dL — AB (ref 0.44–1.00)
GFR calc Af Amer: 36 mL/min — ABNORMAL LOW (ref >60)
GFR calc non Af Amer: 31 mL/min — ABNORMAL LOW (ref >60)
Glucose, Bld: 102 mg/dL — ABNORMAL HIGH (ref 65–99)
Potassium: 3.5 mmol/L (ref 3.5–5.1)
Sodium: 138 mmol/L (ref 135–145)

## 2016-10-21 LAB — CBC
HCT: 37.7 % (ref 36.0–46.0)
Hemoglobin: 12 g/dL (ref 12.0–15.0)
MCH: 29.9 pg (ref 26.0–34.0)
MCHC: 31.8 g/dL (ref 30.0–36.0)
MCV: 93.8 fL (ref 78.0–100.0)
Platelets: 219 10*3/uL (ref 150–400)
RBC: 4.02 MIL/uL (ref 3.87–5.11)
RDW: 12.6 % (ref 11.5–15.5)
WBC: 6.9 10*3/uL (ref 4.0–10.5)

## 2016-10-21 LAB — BRAIN NATRIURETIC PEPTIDE: B Natriuretic Peptide: 175.3 pg/mL — ABNORMAL HIGH (ref 0.0–100.0)

## 2016-10-21 MED ORDER — AMLODIPINE BESYLATE 2.5 MG PO TABS: 2.5000 mg | ORAL_TABLET | Freq: Every day | ORAL | 6 refills | Status: DC

## 2016-10-21 NOTE — Patient Instructions (Signed)
EKG today.  Routine lab work today. Will notify you of abnormal results, otherwise no news is good news!  START Amlodipine 2.5 mg tablet once daily.  Follow up with Dr. Shirlee Latch in 4 months.  Do the following things EVERYDAY: 1) Weigh yourself in the morning before breakfast. Write it down and keep it in a log. 2) Take your medicines as prescribed 3) Eat low salt foods-Limit salt (sodium) to 2000 mg per day.  4) Stay as active as you can everyday 5) Limit all fluids for the day to less than 2 liters

## 2016-10-22 NOTE — Progress Notes (Signed)
Patient ID: [name], female   DOB: [birth year], 76 y.o.   MRN: [id number]    Advanced Heart Failure Clinic Note   PCP: Dr Valentina Lucks  Pulmonary: Dr Maple Hudson HF Cardiology: Dr. Shirlee Latch  HPI: [name] is a 76 y.o. female with a history of chronic diastolic CHF, moderate pulmonary HTN, HTN, CAD s/p CABG, COPD on home oxygen, and dyslipidemia referred to the HF clinic by Dr Mayford Knife.    Admitted 3/2 - 09/30/15 with A/C CHF. TEE was done to assess mitral stenosis, appeared mild to moderate, rheumatic valve.  TEE also showed aortic valve vegetation and concern arose for endocarditis with recent "tooth infection" per pt.  ID saw. 1 of 2 admission cultures with Gram + cocci in clusters thought to be contaminant.  2 further sets of BCx negative. RHC showed elevated right and left heart filling pressures.  Aortic valve was not crossed for full mitral stenosis assessment due to aortic valve vegetation. Diuresed 8 lbs. Discharge weight 175 lbs.   Due to increased dyspnea and exertional chest pain, she had RHC/LHC in 7/17.  Bypass grafts were patent but there was 90% stenosis in the distal LCx that could account for angina.  It was a small caliber vessel and not amenable to PCI. Filling pressures were near-normal on RHC.   Admitted 11/18 through 06/18/16 with chest pain and increased dyspnea. Exacerbation was thought to be from A fib RVR. She underwent successful DC/CV on 11/19. Diuresed with IV lasix and transitioned to  Discharged on amio 200 mg twice a day+ xarelto 15 mg daily. Discharge weight was 175 pounds.    Last echo in 11/17 was stable with EF 55-60% and mild to moderate mitral stenosis, mild MR, mild AS.    She returns for HF follow up. Continues on 2 liters oxygen. Ambulates with walker. Weight is down 1 lb.  She has unchanged dyspnea, short of breath walking short distances but comfortable at rest . No orthopnea/PND.  No chest pain.  No BRBPR/melena. PCP stopped losartan due to cough (also had on  ACEI).  BP is high today.    ECG (personally reviewed): sinus brady at 57, RBBB, LAFB.   - ECHO 12/2014 EF 55-60%, peak PA pressure 68 mmHg, mild to moderate MS. - ECHO 2/17 EF 60-65%, mild LVH, mildly dilated RV with mildly decreased systolic function, PASP 29 mmHg (not sure we got a full envelope), moderate mitral stenosis with mean gradient 6 mmHg and MVA 1.37 cm^2, mild MR, mild AI, aortic sclerosis without significant stenosis.   - TEE 09/24/15 EF 60%, small 0.8 cm mobile mass on the LV side of the aortic valve. Mild AI and AS. 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 A small PFO with a weakly positive bubble study, Moderate TR - Echo (12/17): EF 55-60%, mild aortic stenosis, mild aortic insufficiency, rheumatic mitral valve with mild to moderate mitral stenosis (MVA 1.6 cm^2, mean gradient 8 mmHg), mild MR, moderate TR, PASP 68 mmHg.   PFTs (2/17): moderate-severe obstructive airways disease.   Sleep study 2017 was negative, no significant OSA.   RHC 09/24/15 (did not do LHC and cross valve to assess mitral stenosis due to aortic valve vegetation): Hemodynamics (mmHg) RA mean 16 RV 65/14 PA 71/28, mean 45 PCWP mean 32 Cardiac Output (Fick) 4.85  Cardiac Index (Fick) 2.51 PVR 2.68 WU  LHC/RHC (7/17): 50% pLAD, patent LIMA-LAD, totally occluded PLOM, patent SVG-PLOM, AV LCx distal  to [facility] with 90% stenosis (small caliber), totally occluded PLV, patent SVG-PLV.  RA mean 5 PA 43/13 PCWP mean 17 CI 2.7 PVR 1.9 WU Transient CHB when LV was entered, so unable to measure gradient across MV.   Labs 01/09/2015 K 4.3 Creatinine 0.91 BNP  Labs 02/19/2015 K 4.1 Creatinine 1.03 Labs 2/17 LDL 82 Labs 10/05/15 K 3.8, Creatinine 0.89  Labs 10/08/15 K 4.6, Creatinine 0.7 Labs 6/17 K 4.3, creatinine 0.95, HCT 32.7, BNP 221 Labs 7/17 BNP 286, K 4.2, creatinine 1.0, HCT 31.2 Labs 10/17 K 3.5, creatinine 1.18, LDL 78 Labs 1/18 K 4.2, creatinine  1.5  Past Medical History:  Diagnosis Date  . Acute parotitis 01/05/2016  . Anxiety   . Bacteremia, escherichia coli   . Bursitis, shoulder    bilateral  . CHF (congestive heart failure) (HCC)    chronic diasotlic CHF  . Complication of anesthesia    "have trouble getting me awake sometimes; been ok latelhy" (09/24/2015)  . COPD (chronic obstructive pulmonary disease) (HCC)   . Coronary heart disease 2001   s/p CABG  . Depression   . DJD (degenerative joint disease)   . GERD (gastroesophageal reflux disease)   . H/O hiatal hernia   . History of rheumatic heart disease    X 3-LAST TIME WHEN PATIENT WAS 76 YRS OLD  . Hyperlipidemia   . Hypertension   . OA (osteoarthritis)   . On home oxygen therapy    "2L; 24/7" (09/24/2015)  . PUD (peptic ulcer disease)   . Pulmonary HTN    PASP 50-16mmHg by [id number]  . Pulmonary HTN   . Sleep apnea    intolerant to CPAP  . Thyroid nodule   . Urinary incontinence   . Varicose veins     Social History: The patient  reports that she quit smoking about 7 years ago. Her smoking use included Cigarettes. She has a 47 pack-year smoking history. She has never used smokeless tobacco. She reports that she does not drink alcohol or use illicit drugs.   Family History: The patient's family history includes Anemia in her mother; Emphysema in her father; Esophageal cancer in her daughter.   ROS: All systems negative except as listed in HPI, PMH and Problem List.  Current Outpatient Prescriptions  Medication Sig Dispense Refill  . acetaminophen (TYLENOL) 500 MG tablet Take 1,000 mg by mouth 2 (two) times daily as needed for headache.     Marland Kitchen amiodarone (PACERONE) 200 MG tablet Take 1 tablet (200 mg total) by mouth daily. 30 tablet 6  . calcium-vitamin D (OSCAL WITH D) 500-200 MG-UNIT per tablet Take 2 tablets by mouth daily with breakfast.    . ipratropium (ATROVENT) 0.02 % nebulizer solution Take 2.5 mLs (0.5 mg total) by nebulization every 6 (six)  hours as needed for wheezing or shortness of breath. 75 mL 12  . IRON PO Take 1 tablet by mouth daily.    . isosorbide mononitrate (IMDUR) 60 MG 24 hr tablet Take 1 tablet (60 mg total) by mouth daily. 30 tablet 0  . loratadine (CLARITIN) 10 MG tablet Take 10 mg by mouth at bedtime as needed for allergies.     . metoprolol (LOPRESSOR) 50 MG tablet Take 1 tablet (50 mg total) by mouth 2 (two) times daily. 180 tablet 3  . NITROSTAT 0.4 MG SL tablet place 1 tablet under the tongue if needed every 5 minutes for chest pain for 3 doses IF NO RELIEF AFTER 3RD DOSE CALL PRESCRIBER  OR 911. 25 tablet 0  . omeprazole (PRILOSEC OTC) 20 MG tablet Take 20 mg by mouth at bedtime as needed (GERD).     . OXYGEN Inhale 2-3 L into the lungs continuous.     . potassium chloride SA (K-DUR,KLOR-CON) 20 MEQ tablet Take 2 tablets (40 mEq total) by mouth 2 (two) times daily. 360 tablet 3  . Rivaroxaban (XARELTO) 15 MG TABS tablet Take 1 tablet (15 mg total) by mouth daily with supper. 90 tablet 3  . sertraline (ZOLOFT) 100 MG tablet Take 100 mg by mouth at bedtime.     . simvastatin (ZOCOR) 20 MG tablet Take 3 tablets (60 mg total) by mouth daily at 6 PM. 270 tablet 3  . spironolactone (ALDACTONE) 25 MG tablet Take 1 tablet (25 mg total) by mouth daily. 90 tablet 3  . torsemide (DEMADEX) 20 MG tablet Take 60 mg (3 tabs) in am and 40 mg (2 tabs) in pm. 150 tablet 3  . amLODipine (NORVASC) 2.5 MG tablet Take 1 tablet (2.5 mg total) by mouth daily. 30 tablet 6  . benzonatate (TESSALON) 200 MG capsule Take 1 capsule (200 mg total) by mouth 3 (three) times daily as needed for cough. (Patient not taking: Reported on 10/21/2016) 90 capsule 0   No current facility-administered medications for this encounter.     Vitals:   10/21/16 1359  BP: (!) 150/75  Pulse: (!) 57  SpO2: 97%  Weight: 185 lb 12 oz (84.3 kg)   Wt Readings from Last 3 Encounters:  10/21/16 185 lb 12 oz (84.3 kg)  08/24/16 188 lb 3.2 oz (85.4 kg)   08/22/16 186 lb (84.4 kg)     PHYSICAL EXAM:  General:  NAD, on oxygen 2 liters nasal cannula. Arrived in a wheel chair. Granddaughter present.   HEENT: normal Neck: supple. JVP 5-6. Carotids 2+ bilaterally; no bruits. No thyromegaly or nodule noted.  Cor: PMI normal. RRR. No rubs, gallops. 1/6 SEM RUSB.   Lungs: CTAB. On 2 liters oxygen.  Abdomen: soft , NT. ND. no HSM. No bruits or masses. +BS  Extremities: no cyanosis, clubbing.  No edema. Warm.    Neuro: alert & orientedx3, cranial nerves grossly intact. Moves all 4 extremities w/o difficulty. Affect pleasant.  ASSESSMENT & PLAN: 1. Chronic diastolic CHF/valvular heart disease: TEE in 3/17 showed normal EF, mild to moderate mitral stenosis.  Most recent RHC in 7/17 showed near-normal filling pressures.  Last echo in 11/17 showed normal EF, mild-moderate MS with mild MR, mild AS with mild AI.  Today, NYHA III symptoms, stable.  She does not appear volume overloaded.  COPD may play a large role in her dyspnea.    - Continue torsemide 60 mg in am and 40 mg in pm. BMET/BNP today.  - Continue 25 mg spironolactone daily. 2. CAD S/P CABG:  She has potential source of angina from 90% stenosis in distal LCx.  The vessel is small at this point and not amenable to PCI.  No chest pain.  - Continue statin, good lipids in 10/17.  - On Xarelto so no Aspirin. - Continue Imdur.  3. Atrial fibrillation: Paroxysmal. CHADSVASC score is 4 (age > 69, female, HTN, CHF).  S/P DC-CV 06/12/16. Regular pulse. Continue amio to 200 mg once a day. WIll need to check TSH, LFTS. She understands she should get get yearly eye exam.  - Continue Xarelto/metoprolol.  4. Pulmonary HTN: This is primarily pulmonary venous hypertension based on most recent RHC. 5. COPD:  Followed by Dr Maple Hudson. PFTs in 2/17 with moderate to severe obstruction.  On 2 liters [place].  She has not tolerated inhalers well.  6.  OSA: intolerant CPAP 7.  Mitral stenosis: Rheumatic mitral stenosis.   Mild to moderate mitral stenosis by TEE in 3/17.  Unable to assess by cath in 3/17 because she had aortic valve vegetation and the valve was not crossed. Unable to assess by cath in 7/17 because she had transient complete heart block when the LV was entered. Treating medically for now.  Would be high risk for surgery with severe COPD.  However, possibly could be valvuloplasty candidate.    8. Aortic valve vegetation: Noted on TEE in 3/17 done to assess mitral stenosis.  No fever, blood cultures negative.  May have come from prior dental infection.  ID did not recommend antibiotics.    Follow up in 8 with Dr Shirlee Latch.   Marca Ancona NP-C  10/22/2016

## 2016-10-26 ENCOUNTER — Encounter: Payer: Self-pay | Admitting: Adult Health

## 2016-10-26 ENCOUNTER — Ambulatory Visit (INDEPENDENT_AMBULATORY_CARE_PROVIDER_SITE_OTHER): Payer: Medicare Other | Admitting: Internal Medicine

## 2016-10-26 ENCOUNTER — Ambulatory Visit (INDEPENDENT_AMBULATORY_CARE_PROVIDER_SITE_OTHER): Payer: Medicare Other | Admitting: Adult Health

## 2016-10-26 VITALS — BP 124/76 | HR 58 | Ht 66.0 in | Wt 186.0 lb

## 2016-10-26 DIAGNOSIS — J449 Chronic obstructive pulmonary disease, unspecified: Secondary | ICD-10-CM

## 2016-10-26 DIAGNOSIS — J9611 Chronic respiratory failure with hypoxia: Secondary | ICD-10-CM

## 2016-10-26 DIAGNOSIS — J441 Chronic obstructive pulmonary disease with (acute) exacerbation: Secondary | ICD-10-CM

## 2016-10-26 LAB — PULMONARY FUNCTION TEST
DL/VA % pred: 89 %
DL/VA: 4.53 ml/min/mmHg/L
DLCO UNC % PRED: 42 %
DLCO UNC: 11.55 ml/min/mmHg
DLCO cor % pred: 44 %
DLCO cor: 11.94 ml/min/mmHg
FEF 25-75 PRE: 0.6 L/s
FEF 25-75 Post: 0.78 L/sec
FEF2575-%Change-Post: 30 %
FEF2575-%Pred-Post: 46 %
FEF2575-%Pred-Pre: 35 %
FEV1-%CHANGE-POST: 10 %
FEV1-%PRED-PRE: 39 %
FEV1-%Pred-Post: 43 %
FEV1-POST: 0.98 L
FEV1-PRE: 0.88 L
FEV1FVC-%Change-Post: 0 %
FEV1FVC-%PRED-PRE: 97 %
FEV6-%CHANGE-POST: 14 %
FEV6-%PRED-POST: 48 %
FEV6-%Pred-Pre: 42 %
FEV6-POST: 1.36 L
FEV6-PRE: 1.19 L
FEV6FVC-%Change-Post: 1 %
FEV6FVC-%PRED-POST: 104 %
FEV6FVC-%PRED-PRE: 102 %
FVC-%CHANGE-POST: 11 %
FVC-%Pred-Post: 46 %
FVC-%Pred-Pre: 41 %
FVC-PRE: 1.23 L
FVC-Post: 1.38 L
Post FEV1/FVC ratio: 71 %
Post FEV6/FVC ratio: 99 %
Pre FEV1/FVC ratio: 72 %
Pre FEV6/FVC Ratio: 98 %
RV % pred: 101 %
RV: 2.45 L
TLC % PRED: 69 %
TLC: 3.69 L

## 2016-10-26 MED ORDER — BUDESONIDE-FORMOTEROL FUMARATE 160-4.5 MCG/ACT IN AERO: 2.0000 | INHALATION_SPRAY | Freq: Two times a day (BID) | RESPIRATORY_TRACT | 5 refills | Status: DC

## 2016-10-26 MED ORDER — BUDESONIDE-FORMOTEROL FUMARATE 160-4.5 MCG/ACT IN AERO: 2.0000 | INHALATION_SPRAY | Freq: Two times a day (BID) | RESPIRATORY_TRACT | 0 refills | Status: DC

## 2016-10-26 NOTE — Progress Notes (Signed)
Patient seen in the office today and instructed on use of Symbicort 160.  Patient expressed understanding and demonstrated technique. Boone Master, CMA 10/26/16

## 2016-10-26 NOTE — Progress Notes (Signed)
PFT done today. 

## 2016-10-26 NOTE — Progress Notes (Signed)
@Patient  ID: [name], female    DOB: [birth year], 76 y.o.   MRN: [id number]  Chief Complaint  Patient presents with  . Follow-up    COPD     Referring provider: Maurice Small, MD  HPI: [phone] former smoker with COPD, complicated by pulmonary HTN, on O2  Followed by cards for Valvular heart dz, A Fib , Pulm HTN, CAD   TEST  - ECHO 12/2014 EF 55-60%, peak PA pressure 68 mmHg, mild to moderate MS. - ECHO 2/17 EF 60-65%, mild LVH, mildly dilated RV with mildly decreased systolic function, PASP 29 mmHg (not sure we got a full envelope), moderate mitral stenosis with mean gradient 6 mmHg and MVA 1.37 cm^2, mild MR, mild AI, aortic sclerosis without significant stenosis.  - TEE 09/24/15 EF 60%, small 0.8 cm mobile mass on the LV side of the aortic valve. Mild AI and AS. 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 A small PFO with a weakly positive bubble study, Moderate TR -Pulmonary function test 09/18/2015 showed severe COPD with FEV1 at 56%, ratio 72, FVC 56%, DLCO 96% -Sleep study on 02/22/16 showed no OSA with AHI 0. Severe PLM .   10/26/2016 Follow up ; COPD , O2 RF , Pulm HTN  Pt returns for 3 month follow up.  She has known COPD . She has been having some increased dyspnea for last few months that seems to be progressive. She was advised to use Atrovent Nebs. She says she could not tolerate this . She has multiple allergies.  PFT done today showed FEV 1 43%, ratio 71, 10% BD response , DLCO 42%.  This is decreased from 2017.  She was changed from Lisinopril to Norvasc. Says her cough is better.  She remains on Amiodarone . Denies fever, chest pain, orthopnea,edema or hemoptysis .       Allergies  Allergen Reactions  . Lisinopril Cough    Developed persistent/fry cough for a month.   . Prednisone Rash    jittery  . Tape Other (See Comments)    Bleeding  . Xopenex [Levalbuterol] Other (See Comments)    Made mouth and throat  sore  . Cefpodoxime Other (See Comments)    Yeast infections   . Codeine Other (See Comments)    Anxious/ jittery  . Lipitor [Atorvastatin] Other (See Comments)    Made joint start hurting/ Muscle aches  . Other Swelling    Venholin HFA  . Penicillins Other (See Comments)    Has patient had a PCN reaction causing immediate rash, facial/tongue/throat swelling, SOB or lightheadedness with hypotension: unknown, childhood reaction Has patient had a PCN reaction causing severe rash involving mucus membranes or skin necrosis: No Has patient had a PCN reaction that required hospitalization No Has patient had a PCN reaction occurring within the last 10 years: No If all of the above answers are "NO", then may proceed with Cephalosporin use.   Terald Sleeper [Kdc:Albuterol] Other (See Comments)    "jittery"  . Anoro Ellipta [Umeclidinium-Vilanterol]     itchy  . Clindamycin Other (See Comments)    Unknown   . Hydrocodone-Acetaminophen Anxiety  . Latex Itching and Rash  . Levalbuterol Hcl Other (See Comments)    Unknown     Immunization History  Administered Date(s) Administered  . Influenza Split 04/03/2012, 04/15/2014  . Influenza Whole 04/01/2011  . Influenza, High Dose Seasonal PF 04/18/2016  . Influenza,inj,Quad PF,36+ Mos 05/09/2013, 04/16/2015  .  Pneumococcal Conjugate-13 06/24/2014  . Pneumococcal Polysaccharide-23 07/25/2008  . Tdap 05/25/2012    Past Medical History:  Diagnosis Date  . Acute parotitis 01/05/2016  . Anxiety   . Bacteremia, escherichia coli   . Bursitis, shoulder    bilateral  . CHF (congestive heart failure) (HCC)    chronic diasotlic CHF  . Complication of anesthesia    "have trouble getting me awake sometimes; been ok latelhy" (09/24/2015)  . COPD (chronic obstructive pulmonary disease) (HCC)   . Coronary heart disease 2001   s/p CABG  . Depression   . DJD (degenerative joint disease)   . GERD (gastroesophageal reflux disease)   . H/O hiatal hernia     . History of rheumatic heart disease    X 3-LAST TIME WHEN PATIENT WAS 76 YRS OLD  . Hyperlipidemia   . Hypertension   . OA (osteoarthritis)   . On home oxygen therapy    "2L; 24/7" (09/24/2015)  . PUD (peptic ulcer disease)   . Pulmonary HTN    PASP 50-6mmHg by [id number]  . Pulmonary HTN   . Sleep apnea    intolerant to CPAP  . Thyroid nodule   . Urinary incontinence   . Varicose veins     Tobacco History: History  Smoking Status  . Former Smoker  . Packs/day: 1.00  . Years: 47.00  . Types: Cigarettes  . Quit date: 07/26/2007  Smokeless Tobacco  . Never Used   Counseling given: Not Answered   Outpatient Encounter Prescriptions as of 10/26/2016  Medication Sig  . acetaminophen (TYLENOL) 500 MG tablet Take 1,000 mg by mouth 2 (two) times daily as needed for headache.   Marland Kitchen amiodarone (PACERONE) 200 MG tablet Take 1 tablet (200 mg total) by mouth daily.  Marland Kitchen amLODipine (NORVASC) 2.5 MG tablet Take 1 tablet (2.5 mg total) by mouth daily.  . benzonatate (TESSALON) 200 MG capsule Take 1 capsule (200 mg total) by mouth 3 (three) times daily as needed for cough.  . calcium-vitamin D (OSCAL WITH D) 500-200 MG-UNIT per tablet Take 2 tablets by mouth daily with breakfast.  . IRON PO Take 1 tablet by mouth daily.  . isosorbide mononitrate (IMDUR) 60 MG 24 hr tablet Take 1 tablet (60 mg total) by mouth daily.  Marland Kitchen loratadine (CLARITIN) 10 MG tablet Take 10 mg by mouth at bedtime as needed for allergies.   . metoprolol (LOPRESSOR) 50 MG tablet Take 1 tablet (50 mg total) by mouth 2 (two) times daily.  Marland Kitchen NITROSTAT 0.4 MG SL tablet place 1 tablet under the tongue if needed every 5 minutes for chest pain for 3 doses IF NO RELIEF AFTER 3RD DOSE CALL PRESCRIBER OR 911.  . omeprazole (PRILOSEC OTC) 20 MG tablet Take 20 mg by mouth at bedtime as needed (GERD).   . OXYGEN Inhale 2-3 L into the lungs continuous.   . potassium chloride SA (K-DUR,KLOR-CON) 20 MEQ tablet Take 2 tablets (40 mEq total) by  mouth 2 (two) times daily.  . Rivaroxaban (XARELTO) 15 MG TABS tablet Take 1 tablet (15 mg total) by mouth daily with supper.  . sertraline (ZOLOFT) 100 MG tablet Take 100 mg by mouth at bedtime.   . simvastatin (ZOCOR) 20 MG tablet Take 3 tablets (60 mg total) by mouth daily at 6 PM.  . spironolactone (ALDACTONE) 25 MG tablet Take 1 tablet (25 mg total) by mouth daily.  Marland Kitchen torsemide (DEMADEX) 20 MG tablet Take 60 mg (3 tabs) in am and 40 mg (  2 tabs) in pm.  . ipratropium (ATROVENT) 0.02 % nebulizer solution Take 2.5 mLs (0.5 mg total) by nebulization every 6 (six) hours as needed for wheezing or shortness of breath. (Patient not taking: Reported on 10/26/2016)   No facility-administered encounter medications on file as of 10/26/2016.      Review of Systems  Constitutional:   No  weight loss, night sweats,  Fevers, chills, + fatigue, or  lassitude.  HEENT:   No headaches,  Difficulty swallowing,  Tooth/dental problems, or  Sore throat,                No sneezing, itching, ear ache, nasal congestion, post nasal drip,   CV:  No chest pain,  Orthopnea, PND, swelling in lower extremities, anasarca, dizziness, palpitations, syncope.   GI  No heartburn, indigestion, abdominal pain, nausea, vomiting, diarrhea, change in bowel habits, loss of appetite, bloody stools.   Resp:    No chest wall deformity  Skin: no rash or lesions.  GU: no dysuria, change in color of urine, no urgency or frequency.  No flank pain, no hematuria   MS:  No joint pain or swelling.  No decreased range of motion.  No back pain.    Physical Exam  BP 124/76 (BP Location: Left Arm, Patient Position: Sitting, Cuff Size: Normal)   Pulse (!) 58   Ht 5\' 6"  (1.676 m)   Wt 186 lb (84.4 kg)   SpO2 92%   BMI 30.02 kg/m   GEN: A/Ox3; pleasant , NAD, elderly    HEENT:  [place]/AT,  EACs-clear, TMs-wnl, NOSE-clear, THROAT-clear, no lesions, no postnasal drip or exudate noted.   NECK:  Supple w/ fair ROM; no JVD; normal carotid  impulses w/o bruits; no thyromegaly or nodules palpated; no lymphadenopathy.    RESP  Decreased BS in bases w/o, wheezes/ rales/ or rhonchi. no accessory muscle use, no dullness to percussion  CARD:  RRR, no m/r/g, no peripheral edema, pulses intact, no cyanosis or clubbing.  GI:   Soft & nt; nml bowel sounds; no organomegaly or masses detected.   Musco: Warm bil, no deformities or joint swelling noted.   Neuro: alert, no focal deficits noted.    Skin: Warm, no lesions or rashes    Lab Results:  CBC  BNP Imaging: No results found.   Assessment & Plan:   No problem-specific Assessment & Plan notes found for this encounter.     Rubye Oaks, NP 10/26/2016

## 2016-10-26 NOTE — Patient Instructions (Addendum)
Trial of Symbicort 160mg  2 puffs Twice daily  , rinse after use .  Set up for HRCT chest .  Continue on Oxygen 2l/m .  Follow up with Dr. Maple Hudson  In 3 months and As needed

## 2016-11-03 ENCOUNTER — Ambulatory Visit (INDEPENDENT_AMBULATORY_CARE_PROVIDER_SITE_OTHER)
Admission: RE | Admit: 2016-11-03 | Discharge: 2016-11-03 | Disposition: A | Discharge status: Home / Self Care | Payer: Medicare Other | Source: Ambulatory Visit | Attending: Adult Health | Admitting: Adult Health

## 2016-11-03 DIAGNOSIS — J449 Chronic obstructive pulmonary disease, unspecified: Secondary | ICD-10-CM

## 2016-11-07 ENCOUNTER — Encounter: Payer: Self-pay | Admitting: Adult Health

## 2016-11-07 DIAGNOSIS — R911 Solitary pulmonary nodule: Secondary | ICD-10-CM | POA: Insufficient documentation

## 2016-11-07 NOTE — Assessment & Plan Note (Signed)
Drop in PFT , esp DLCO .  Check HRCT -r/o Amiod lung toxcity Trial of Symbicort.   Plan Patient Instructions  Trial of Symbicort 160mg  2 puffs Twice daily  , rinse after use .  Set up for HRCT chest .  Continue on Oxygen 2l/m .  Follow up with Dr. Maple Hudson  In 3 months and As needed

## 2016-11-07 NOTE — Assessment & Plan Note (Signed)
Cont on O2 .  

## 2016-11-08 ENCOUNTER — Other Ambulatory Visit: Payer: Self-pay | Admitting: *Deleted

## 2016-11-08 DIAGNOSIS — R911 Solitary pulmonary nodule: Secondary | ICD-10-CM

## 2016-11-08 NOTE — Progress Notes (Signed)
Called spoke with patient, advised of CT results / recs as stated by TP.  Pt verbalized her understanding and denied any questions.  Orders only encounter created for the CT order.  Pt is scheduled to see CY on 7.9.18 and would like this coordinated.

## 2016-11-25 ENCOUNTER — Other Ambulatory Visit (HOSPITAL_COMMUNITY): Payer: Self-pay | Admitting: Cardiology

## 2016-12-14 ENCOUNTER — Other Ambulatory Visit (HOSPITAL_COMMUNITY): Payer: Self-pay | Admitting: Pharmacist

## 2016-12-14 MED ORDER — RIVAROXABAN 15 MG PO TABS: 15.0000 mg | ORAL_TABLET | Freq: Every day | ORAL | 3 refills | Status: DC

## 2016-12-14 NOTE — Telephone Encounter (Signed)
Verified that Xarelto patient assistance still approved through 02/04/17. I have called [facility] pharmacy and verified that it went through for no cost to [name]. I also called [name] and relayed this information to her.   ID: [id number] GRP: [id number] BIN: [id number]  [name] K. [name], PharmD, BCPS, CPP Clinical Pharmacist Pager: [phone] Phone: [phone] 12/14/2016 2:30 PM

## 2017-01-11 ENCOUNTER — Other Ambulatory Visit (HOSPITAL_COMMUNITY): Payer: Self-pay | Admitting: Cardiology

## 2017-01-12 ENCOUNTER — Other Ambulatory Visit (HOSPITAL_COMMUNITY): Payer: Self-pay | Admitting: Adult Health

## 2017-01-13 ENCOUNTER — Other Ambulatory Visit (HOSPITAL_COMMUNITY): Payer: Self-pay | Admitting: *Deleted

## 2017-01-13 MED ORDER — AMIODARONE HCL 200 MG PO TABS: 200.0000 mg | ORAL_TABLET | Freq: Every day | ORAL | 6 refills | Status: DC

## 2017-01-23 ENCOUNTER — Ambulatory Visit (INDEPENDENT_AMBULATORY_CARE_PROVIDER_SITE_OTHER)
Admission: RE | Admit: 2017-01-23 | Discharge: 2017-01-23 | Disposition: A | Discharge status: Home / Self Care | Payer: Medicare Other | Source: Ambulatory Visit | Attending: Adult Health | Admitting: Adult Health

## 2017-01-23 DIAGNOSIS — R911 Solitary pulmonary nodule: Secondary | ICD-10-CM | POA: Diagnosis not present

## 2017-01-30 ENCOUNTER — Ambulatory Visit (INDEPENDENT_AMBULATORY_CARE_PROVIDER_SITE_OTHER): Payer: Medicare Other | Admitting: Internal Medicine

## 2017-01-30 ENCOUNTER — Encounter: Payer: Self-pay | Admitting: Internal Medicine

## 2017-01-30 VITALS — BP 124/70 | HR 61 | Ht 66.0 in | Wt 187.0 lb

## 2017-01-30 DIAGNOSIS — R918 Other nonspecific abnormal finding of lung field: Secondary | ICD-10-CM | POA: Diagnosis not present

## 2017-01-30 DIAGNOSIS — I482 Chronic atrial fibrillation, unspecified: Secondary | ICD-10-CM

## 2017-01-30 DIAGNOSIS — R911 Solitary pulmonary nodule: Secondary | ICD-10-CM

## 2017-01-30 DIAGNOSIS — J9611 Chronic respiratory failure with hypoxia: Secondary | ICD-10-CM | POA: Diagnosis not present

## 2017-01-30 DIAGNOSIS — I5033 Acute on chronic diastolic (congestive) heart failure: Secondary | ICD-10-CM | POA: Diagnosis not present

## 2017-01-30 DIAGNOSIS — J449 Chronic obstructive pulmonary disease, unspecified: Secondary | ICD-10-CM | POA: Diagnosis not present

## 2017-01-30 DIAGNOSIS — I272 Pulmonary hypertension, unspecified: Secondary | ICD-10-CM | POA: Diagnosis not present

## 2017-01-30 NOTE — Patient Instructions (Addendum)
You were taken off lisinopril because it was making you cough. Cardiology may need to replace it with something else next time you see them.  Order- schedule future in 9 months- CT chest no contrast    Dx lung nodules  Please call as needed

## 2017-01-30 NOTE — Progress Notes (Signed)
Patient ID: KATASHA RIGA, female    DOB: [birth year], 76 y.o.   MRN: [id number]   F former smoker followed for female former smoker followed for COPD, PLMS, complicated bipolar hypertension, CHF, CAD/MI/ AFib Sleep study on 02/22/16 showed no OSA with AHI 0. Severe PLM  -----------------------------------------------------------------------------------------------  04/18/2016-76 year old female former smoker followed for COPD, PLMS, complicated bipolar hypertension, CHF, CAD/MI/ AFib O2 2 L/[facility] LOV 03/10/2016-NP-  Trial of Anoro for COPD Sleep study on 02/22/16 showed no OSA with AHI 0. Severe PLM  FOLLOWS FOR: Pt states she is feeling about the same since seeing TP 03-10-16; continues to have SOB and fatigue. Also, was not able to use Anoro-itchy. Pt would like flu shot today. Says Anoro made her scalp itch. It did not help her breathing. If she tries any activity without her oxygen she gets substernal pressure pain, quickly relieved by resuming oxygen. NPSG was negative for OSA. 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 She places a bar of soap at the foot of her bed to treat this problem and thinks it helps her.  01/30/17- 76 year old female former smoker followed for COPD, PLMS, complicated bipolar hypertension, CHF, CAD/MI/ AFib O2 2 L/[facility]- requalified today FOLLOWS FOR: DME: [facility]-Pt continues to use O2 daily; SOB with exertion. Review CT chest from 01-23-17 Using oxygen most of the time especially with exertion. Cough resolved when she stopped lisinopril. She hasn't discussed replacement with cardiology. Still using Claritin Still using Claritin. Regular nebulizer treatments with ipratropium blamed for making her jittery. Not using any inhaled medications now and denies routine wheeze or cough. We discussed her CT, images reviewed.  CT chest 01/23/17 IMPRESSION: 1. Stable exam. The multiple bilateral tiny pulmonary nodules identified on  the previous study are stable in the interval. Earliest indicated follow-up will be based on the 5 mm right lung nodules. Given the history of smoking, non-contrast chest CT can be considered in 9-12 months. The ground-glass nodule in the left apex can be reassessed at the time of this follow-up CT scan and if the smaller nodules are stable, additional follow-up recommendations will be based on this ground-glass lesion. This recommendation follows the consensus statement: Guidelines for Management of Incidental Pulmonary Nodules Detected on CT Images: From the Fleischner Society 2017; Radiology 2017; 284:228-243. 2. Morphologic changes in the liver raising the question of cirrhosis. Aortic Atherosclerois (ICD10-170.0)  Review of Systems- see HPI Constitutional:   + weight loss, night sweats, no-Fevers, chills,No- fatigue, lassitude. HEENT:   + headaches,  No-Difficulty swallowing,  Tooth/dental problems, Sore throat,                No-sneezing, itching, ear ache, + nasal congestion, CV:  No chest pain, orthopnea, PND, +swelling in lower extremities, no-anasarca, dizziness, palpitations GI  No heartburn, indigestion, abdominal pain, nausea, vomiting, ,  Resp:   No excess mucus, no productive cough,  + non-productive cough,  No coughing up of blood.    No change in color of mucus.  No wheezing.   Skin: no rash or lesions. GU: . MS:  No joint pain or swelling.   Psych:  No change in mood or affect. No depression +anxiety.  No memory loss.  Objective:   Physical Exam  General- +Alert, Oriented, Affect-appropriate, Distress- none acute        + On O2 2L  Skin- rash-none, lesions- none, excoriation- none Lymphadenopathy- none Head- atraumatic  Eyes- Gross vision intact, PERRLA, conjunctivae clear secretions            Ears- Hearing, canals normal            Nose- Clear, No-septal dev, mucus, polyps, erosion, perforation             Throat- Mallampati III , mucosa clear/ dry ,  drainage- none, tonsils- atrophic.                        Large tongue. Neck- flexible , trachea midline, no stridor , thyroid nl, carotid no bruit Chest - symmetrical excursion , unlabored           Heart/CV- + IRR ,Murmur 2/6 S , no gallop  , no rub, nl s1 s2                           - JVD- none , edema-none, stasis changes- none, varices- none.           Lung-  Clear to P&A/Diminished/unlabored, wheeze- none , dullness-none, rub- none           Chest wall-  Abd-        + lumbar scoliosis/ juts right hip Br/ Gen/ Rectal- Not done, not indicated Extrem- cyanosis- none, clubbing, none, atrophy- none, strength- nl, + Rolling walker Neuro- grossly intact to observation  Assessment & Plan:

## 2017-01-31 ENCOUNTER — Telehealth: Payer: Self-pay | Admitting: Internal Medicine

## 2017-01-31 NOTE — Telephone Encounter (Signed)
Notes recorded by Julio Sicks, NP on 01/30/2017 at 8:03 AM EDT Lung nodules are stable to slightly smaller -that is a good thing.  Check CT chest in 1 year (w/o contrast ) to follow  Make sure she has a follow up with Dr. Maple Hudson After CT chest so it can be reviewed .   Spoke with patient. She is aware of results. Nothing further needed at time of call.

## 2017-02-01 NOTE — Assessment & Plan Note (Signed)
She remains dependent on oxygen which we discussed again.

## 2017-02-01 NOTE — Assessment & Plan Note (Signed)
Rhythm was extremely regular on exam at this visit. Managed by cardiology.

## 2017-02-01 NOTE — Assessment & Plan Note (Signed)
She continues supplemental oxygen

## 2017-02-01 NOTE — Assessment & Plan Note (Signed)
Plan follow-up CT chest 9 months

## 2017-02-01 NOTE — Assessment & Plan Note (Signed)
She can't afford Xopenex and hasn't tolerated stimulant bronchodilators. She even blames ipratropium nebulizer solution for "jitters". Currently she feels comfortable so we're going to watch how she does off of bronchodilators at this time.

## 2017-02-01 NOTE — Assessment & Plan Note (Signed)
Was in a frail was stopped and cough resolved. Cardiology will need to decide if lisinopril should be replaced by an alternative with less tendency to cause cough.

## 2017-02-17 ENCOUNTER — Telehealth (HOSPITAL_COMMUNITY): Payer: Self-pay | Admitting: Cardiology

## 2017-02-17 NOTE — Telephone Encounter (Signed)
Patient called regarding PAP/PA for xarelto Advised that PAP application was valid 09/16/16-02/04/17, seems as if patient will need to update application. Phone number [employer] application [phone])   give to patient to begin application and advised will forward to CHF pharmD as FYI   Samples provided as patient only has two tabs left  Medication Samples have been provided to the patient.  Drug name: xarelto       Strength: 15mg         Qty: 14  LOT: [id number]  Exp.Date: 10/2017   Dosing instructions: ONE TAB DAILY IN THE PM  The patient has been instructed regarding the correct time, dose, and frequency of taking this medication, including desired effects and most common side effects.   Magda Bernheim M 12:47 PM 02/17/2017

## 2017-02-21 NOTE — Telephone Encounter (Signed)
I am just awaiting the pharmacy print outs from [facility] and [facility] for Ms. Waits who states that she just mailed them to me within the past week. Will look out for them and send the completed application to [employer] once they are received.   Tyler Deis. [name], PharmD, BCPS, CPP Clinical Pharmacist Pager: [phone] Phone: [phone] 02/21/2017 2:14 PM

## 2017-02-23 ENCOUNTER — Other Ambulatory Visit (HOSPITAL_COMMUNITY): Payer: Self-pay | Admitting: Pharmacist

## 2017-02-23 MED ORDER — RIVAROXABAN 15 MG PO TABS: 15.0000 mg | ORAL_TABLET | Freq: Every day | ORAL | 3 refills | Status: DC

## 2017-02-28 ENCOUNTER — Telehealth (HOSPITAL_COMMUNITY): Payer: Self-pay | Admitting: Pharmacist

## 2017-02-28 NOTE — Telephone Encounter (Signed)
Have not heard back from [employer] patient assistance in regards to patient's Xarelto and patient is about to run out of medication. Will provide with samples in the meantime.   Medication Samples have been provided to the patient.  Drug name: Xarelto       Strength: 15 mg        Qty: 21  LOT: [id number]  Exp.Date: 12/20  Dosing instructions: Take 1 tablet daily   The patient has been instructed regarding the correct time, dose, and frequency of taking this medication, including desired effects and most common side effects.   Tyler Deis Makenley Shimp 2:44 PM 02/28/2017   Tyler Deis. [name], PharmD, BCPS, CPP Clinical Pharmacist Pager: [phone] Phone: [phone] 02/28/2017 2:44 PM

## 2017-03-13 ENCOUNTER — Telehealth (HOSPITAL_COMMUNITY): Payer: Self-pay | Admitting: Pharmacist

## 2017-03-13 MED ORDER — RIVAROXABAN 15 MG PO TABS: 15.0000 mg | ORAL_TABLET | Freq: Every day | ORAL | 3 refills | Status: DC

## 2017-03-13 NOTE — Telephone Encounter (Signed)
[employer] patient assistance has issued a temporary approval for Xarelto 15 mg daily through 04/23/17. Unsure why it is a temporary approval but will send renewal application again since Dr. Alford Highland signature was missing. Will send Rx to [facility] and verify coverage.   ID: [id number] GRP: [id number] BIN: [id number]  Lalisa Kiehn K. [name], PharmD, BCPS, CPP Clinical Pharmacist Pager: [phone] Phone: [phone] 03/13/2017 1:16 PM

## 2017-03-13 NOTE — Telephone Encounter (Signed)
Verified $0 charge with [employer]. Patient already picked up.   Tyler Deis. [name], PharmD, BCPS, CPP Clinical Pharmacist Pager: [phone] Phone: [phone] 03/13/2017 2:18 PM

## 2017-03-20 NOTE — Telephone Encounter (Signed)
Verified with [employer] patient assistance that patient was approved through 07/24/17.   [name]. [name], PharmD, BCPS, CPP Clinical Pharmacist Pager: [phone] Phone: [phone] 03/20/2017 9:53 AM

## 2017-05-26 ENCOUNTER — Other Ambulatory Visit (HOSPITAL_COMMUNITY): Payer: Self-pay | Admitting: Internal Medicine

## 2017-05-26 ENCOUNTER — Other Ambulatory Visit (HOSPITAL_COMMUNITY): Payer: Self-pay | Admitting: Cardiology

## 2017-05-30 ENCOUNTER — Other Ambulatory Visit (HOSPITAL_COMMUNITY): Payer: Self-pay | Admitting: *Deleted

## 2017-05-30 MED ORDER — ISOSORBIDE MONONITRATE ER 60 MG PO TB24: 60.0000 mg | ORAL_TABLET | Freq: Every day | ORAL | 3 refills | Status: DC

## 2017-05-30 MED ORDER — METOPROLOL TARTRATE 50 MG PO TABS: 50.0000 mg | ORAL_TABLET | Freq: Two times a day (BID) | ORAL | 3 refills | Status: DC

## 2017-05-31 ENCOUNTER — Other Ambulatory Visit (HOSPITAL_COMMUNITY): Payer: Self-pay | Admitting: *Deleted

## 2017-06-05 ENCOUNTER — Ambulatory Visit: Payer: Medicare Other | Admitting: Internal Medicine

## 2017-06-26 ENCOUNTER — Ambulatory Visit: Payer: Medicare Other | Admitting: Internal Medicine

## 2017-06-26 ENCOUNTER — Encounter: Payer: Self-pay | Admitting: Internal Medicine

## 2017-06-26 VITALS — BP 120/76 | HR 57 | Ht 66.0 in | Wt 189.8 lb

## 2017-06-26 DIAGNOSIS — I482 Chronic atrial fibrillation, unspecified: Secondary | ICD-10-CM

## 2017-06-26 DIAGNOSIS — R918 Other nonspecific abnormal finding of lung field: Secondary | ICD-10-CM

## 2017-06-26 DIAGNOSIS — R911 Solitary pulmonary nodule: Secondary | ICD-10-CM

## 2017-06-26 DIAGNOSIS — J449 Chronic obstructive pulmonary disease, unspecified: Secondary | ICD-10-CM | POA: Diagnosis not present

## 2017-06-26 DIAGNOSIS — G4733 Obstructive sleep apnea (adult) (pediatric): Secondary | ICD-10-CM | POA: Diagnosis not present

## 2017-06-26 DIAGNOSIS — J9611 Chronic respiratory failure with hypoxia: Secondary | ICD-10-CM

## 2017-06-26 MED ORDER — UMECLIDINIUM-VILANTEROL 62.5-25 MCG/INH IN AEPB: 1.0000 | INHALATION_SPRAY | Freq: Every day | RESPIRATORY_TRACT | 12 refills | Status: DC

## 2017-06-26 NOTE — Progress Notes (Signed)
Patient ID: [name], female    DOB: [birth year], 76 y.o.   MRN: [id number]   F former smoker followed for female former smoker followed for COPD, Lung Nodules,  PLMS, complicated by hypertension, CHF, CAD/MI/ AFib Sleep study on 02/22/16 showed no OSA with AHI 0. Severe PLM  PFT 10/26/16-severe obstructive airways disease, insignificant response, severe restriction, severe diffusion defect.  FVC 1.38/46%, FEV1 0.98/43%, ratio 0.71, TLC 69%, DLCO 42% ---------------------------------------------------------------------------------------------- 01/30/17- 76 year old female former smoker followed for COPD, PLMS, complicated bipolar hypertension, CHF, CAD/MI/ AFib O2 2 L/[facility]- requalified today FOLLOWS FOR: DME: [facility]-Pt continues to use O2 daily; SOB with exertion. Review CT chest from 01-23-17 Using oxygen most of the time especially with exertion. Cough resolved when she stopped lisinopril. She hasn't discussed replacement with cardiology. Still using Claritin Still using Claritin. Regular nebulizer treatments with ipratropium blamed for making her jittery. Not using any inhaled medications now and denies routine wheeze or cough. We discussed her CT, images reviewed.  CT chest 01/23/17 IMPRESSION: 1. Stable exam. The multiple bilateral tiny pulmonary nodules identified on the previous study are stable in the interval. Earliest indicated follow-up will be based on the 5 mm right lung nodules. Given the history of smoking, non-contrast chest CT can be considered in 9-12 months. The ground-glass nodule in the left apex can be reassessed at the time of this follow-up CT scan and if the smaller nodules are stable, additional follow-up recommendations will be based on this ground-glass lesion. This recommendation follows the consensus statement: Guidelines for Management of Incidental Pulmonary Nodules Detected on CT Images: From the Fleischner Society 2017; Radiology 2017; 284:228-243. 2.  Morphologic changes in the liver raising the question of cirrhosis. Aortic Atherosclerois (ICD10-170.0)  06/26/17- 10 year-old female former smoker followed for COPD, Chronic Respiratory Failure, lung nodules, PLMS, complicated by  hypertension, CHF, CAD/MI/ AFib/ Xarelto O2 2 L/[facility]  requalified for o2 today Pending f/u CT for lung nodules July, 2019 ---Chronic Resp Failure: Pt states without O2 she is unable to go any where or do anything. Pt recently had bout of dry cough-spoke with PCP office and told to use Mucinex OTC.  Clear mucus, no fever.  No inhalers PFT 10/26/16-severe obstructive airways disease, insignificant response, severe restriction, severe diffusion defect.  FVC 1.38/46%, FEV1 0.98/43%, ratio 0.71, TLC 69%, DLCO 42%  Review of Systems- see HPI + = positive Constitutional:   + weight loss, night sweats, no-Fevers, chills,No- fatigue, lassitude. HEENT:   + headaches,  No-Difficulty swallowing,  Tooth/dental problems, Sore throat,                No-sneezing, itching, ear ache, + nasal congestion, CV:  No chest pain, orthopnea, PND, +swelling in lower extremities, no-anasarca, dizziness, palpitations GI  No heartburn, indigestion, abdominal pain, nausea, vomiting, ,  Resp:   No excess mucus, no productive cough,  + non-productive cough,  No coughing up of blood.    No                    change in color of mucus.  No wheezing.   Skin: no rash or lesions. GU: . MS:  No joint pain or swelling.   Psych:  No change in mood or affect. No depression +anxiety.  No memory loss.  Objective:   Physical Exam  General- +Alert, Oriented, Affect-appropriate, Distress- none acute        + On O2 2L  Skin- rash-none, lesions- none, excoriation- none Lymphadenopathy- none Head- atraumatic  Eyes- Gross vision intact, PERRLA, conjunctivae clear secretions            Ears- Hearing, canals normal            Nose- Clear, No-septal dev, mucus, polyps, erosion, perforation              Throat- Mallampati III , mucosa clear/ dry , drainage- none, tonsils- atrophic.                        Large tongue. Neck- flexible , trachea midline, no stridor , thyroid nl, carotid no bruit Chest - symmetrical excursion , unlabored           Heart/CV- + IRR ,Murmur 2/6 S , no gallop  , no rub, nl s1 s2                           - JVD- none , edema-none, stasis changes- none, varices- none.           Lung-  Clear to P&A/Diminished/unlabored, wheeze- none , dullness-none, rub- none           Chest wall-  Abd-        + lumbar scoliosis/ juts right hip Br/ Gen/ Rectal- Not done, not indicated Extrem- cyanosis- none, clubbing, none, atrophy- none, strength- nl, + Rolling walker Neuro- grossly intact to observation  Assessment & Plan:

## 2017-06-26 NOTE — Patient Instructions (Addendum)
Ok to continue oxygen at 2-3L/minute  Sample and script printed for [employer] inhaler    Inhale 1 puff, once daily  Order- future schedule CT chest no contrast    June, 2019     Dx Lung nodules  Please call if we can help

## 2017-06-27 MED ORDER — UMECLIDINIUM-VILANTEROL 62.5-25 MCG/INH IN AEPB: 1.0000 | INHALATION_SPRAY | Freq: Every day | RESPIRATORY_TRACT | 0 refills | Status: DC

## 2017-06-27 MED ORDER — FLUTICASONE-UMECLIDIN-VILANT 100-62.5-25 MCG/INH IN AEPB: 1.0000 | INHALATION_SPRAY | Freq: Every day | RESPIRATORY_TRACT | 0 refills | Status: DC

## 2017-06-29 ENCOUNTER — Telehealth: Payer: Self-pay | Admitting: Internal Medicine

## 2017-06-29 NOTE — Telephone Encounter (Signed)
Ok samples left up front and pt notified.

## 2017-06-29 NOTE — Telephone Encounter (Signed)
CY ok to give pt samples until Jan? Please advise.   Current Outpatient Medications on File Prior to Visit  Medication Sig Dispense Refill  . acetaminophen (TYLENOL) 500 MG tablet Take 1,000 mg by mouth 2 (two) times daily as needed for headache.     Marland Kitchen. amiodarone (PACERONE) 200 MG tablet Take 1 tablet (200 mg total) by mouth daily. 30 tablet 6  . amLODipine (NORVASC) 2.5 MG tablet TAKE 1 TABLET BY MOUTH  DAILY 90 tablet 3  . benzonatate (TESSALON) 200 MG capsule Take 1 capsule (200 mg total) by mouth 3 (three) times daily as needed for cough. 90 capsule 0  . calcium-vitamin D (OSCAL WITH D) 500-200 MG-UNIT per tablet Take 2 tablets by mouth daily with breakfast.    . IRON PO Take 1 tablet by mouth daily.    . isosorbide mononitrate (IMDUR) 60 MG 24 hr tablet Take 1 tablet (60 mg total) daily by mouth. 90 tablet 3  . loratadine (CLARITIN) 10 MG tablet Take 10 mg by mouth at bedtime as needed for allergies.     . metoprolol tartrate (LOPRESSOR) 50 MG tablet Take 1 tablet (50 mg total) 2 (two) times daily by mouth. 180 tablet 3  . NITROSTAT 0.4 MG SL tablet place 1 tablet under the tongue if needed every 5 minutes for chest pain for 3 doses IF NO RELIEF AFTER 3RD DOSE CALL PRESCRIBER OR 911. 25 tablet 0  . omeprazole (PRILOSEC OTC) 20 MG tablet Take 20 mg by mouth at bedtime as needed (GERD).     . OXYGEN Inhale 2-3 L into the lungs continuous.     . potassium chloride SA (K-DUR,KLOR-CON) 20 MEQ tablet TAKE 2 TABLETS BY MOUTH TWO TIMES DAILY 360 tablet 6  . Rivaroxaban (XARELTO) 15 MG TABS tablet Take 1 tablet (15 mg total) by mouth daily with supper. 90 tablet 3  . sertraline (ZOLOFT) 100 MG tablet Take 100 mg by mouth at bedtime.     . simvastatin (ZOCOR) 20 MG tablet TAKE 3 TABLETS BY MOUTH  DAILY AT 6 PM 270 tablet 3  . spironolactone (ALDACTONE) 25 MG tablet TAKE 1 TABLET BY MOUTH  DAILY 90 tablet 3  . torsemide (DEMADEX) 20 MG tablet TAKE 3 TABLETS BY MOUTH IN  THE MORNING AND 2 TABLETS  IN  THE EVENING 150 tablet 6  . umeclidinium-vilanterol (ANORO ELLIPTA) 62.5-25 MCG/INH AEPB Inhale 1 puff into the lungs daily. 60 each 12  . umeclidinium-vilanterol (ANORO ELLIPTA) 62.5-25 MCG/INH AEPB Inhale 1 puff into the lungs daily. 1 each 0   No current facility-administered medications on file prior to visit.    Allergies  Allergen Reactions  . Lisinopril Cough    Developed persistent/fry cough for a month.   . Prednisone Rash    jittery  . Tape Other (See Comments)    Bleeding  . Xopenex [Levalbuterol] Other (See Comments)    Made mouth and throat sore  . Cefpodoxime Other (See Comments)    Yeast infections   . Codeine Other (See Comments)    Anxious/ jittery  . Lipitor [Atorvastatin] Other (See Comments)    Made joint start hurting/ Muscle aches  . Other Swelling    Venholin HFA  . Penicillins Other (See Comments)    Has patient had a PCN reaction causing immediate rash, facial/tongue/throat swelling, SOB or lightheadedness with hypotension: unknown, childhood reaction Has patient had a PCN reaction causing severe rash involving mucus membranes or skin necrosis: No Has patient had a  PCN reaction that required hospitalization No Has patient had a PCN reaction occurring within the last 10 years: No If all of the above answers are "NO", then may proceed with Cephalosporin use.   Terald Sleeper [Kdc:Albuterol] Other (See Comments)    "jittery"  . Anoro Ellipta [Umeclidinium-Vilanterol]     itchy  . Clindamycin Other (See Comments)    Unknown   . Hydrocodone-Acetaminophen Anxiety  . Latex Itching and Rash  . Levalbuterol Hcl Other (See Comments)    Unknown

## 2017-06-29 NOTE — Telephone Encounter (Signed)
Ok to give 2 samples of Anoro if we have them. She can take it every other day to make it last, if she needs to.

## 2017-06-30 NOTE — Assessment & Plan Note (Signed)
Hopefully her heart rhythm control will tolerate a trial of LABA/LAMA Anoro.  We do need to see if we can improve her airflow.  Consider trial of LAMA like spiriva if adrenergic bronchodilators not tolerated.

## 2017-06-30 NOTE — Assessment & Plan Note (Signed)
Requalified.  She is oxygen dependent 2-3 L/min

## 2017-06-30 NOTE — Assessment & Plan Note (Signed)
Continue Xarelto, followed by cardiology

## 2017-06-30 NOTE — Assessment & Plan Note (Signed)
This problem is being resolved

## 2017-06-30 NOTE — Assessment & Plan Note (Signed)
These will probably be benign. Plan schedule future CT for follow-up of nodules in 6 months

## 2017-07-15 ENCOUNTER — Other Ambulatory Visit: Payer: Self-pay | Admitting: Adult Health

## 2017-09-01 ENCOUNTER — Other Ambulatory Visit (HOSPITAL_COMMUNITY): Payer: Self-pay | Admitting: *Deleted

## 2017-09-01 MED ORDER — METOPROLOL TARTRATE 50 MG PO TABS: 50.0000 mg | ORAL_TABLET | Freq: Two times a day (BID) | ORAL | 0 refills | Status: AC

## 2017-09-05 ENCOUNTER — Encounter (HOSPITAL_COMMUNITY): Payer: Self-pay | Admitting: Cardiology

## 2017-09-05 ENCOUNTER — Ambulatory Visit (HOSPITAL_COMMUNITY)
Admission: RE | Admit: 2017-09-05 | Discharge: 2017-09-05 | Disposition: A | Discharge status: Home / Self Care | Payer: Medicare Other | Source: Ambulatory Visit | Attending: Cardiology | Admitting: Cardiology

## 2017-09-05 VITALS — BP 140/52 | HR 55 | Wt 199.8 lb

## 2017-09-05 DIAGNOSIS — I050 Rheumatic mitral stenosis: Secondary | ICD-10-CM | POA: Diagnosis not present

## 2017-09-05 DIAGNOSIS — Z951 Presence of aortocoronary bypass graft: Secondary | ICD-10-CM | POA: Diagnosis not present

## 2017-09-05 DIAGNOSIS — F329 Major depressive disorder, single episode, unspecified: Secondary | ICD-10-CM | POA: Diagnosis not present

## 2017-09-05 DIAGNOSIS — J449 Chronic obstructive pulmonary disease, unspecified: Secondary | ICD-10-CM | POA: Insufficient documentation

## 2017-09-05 DIAGNOSIS — I5032 Chronic diastolic (congestive) heart failure: Secondary | ICD-10-CM | POA: Insufficient documentation

## 2017-09-05 DIAGNOSIS — Z8711 Personal history of peptic ulcer disease: Secondary | ICD-10-CM | POA: Diagnosis not present

## 2017-09-05 DIAGNOSIS — I480 Paroxysmal atrial fibrillation: Secondary | ICD-10-CM | POA: Insufficient documentation

## 2017-09-05 DIAGNOSIS — R0989 Other specified symptoms and signs involving the circulatory and respiratory systems: Secondary | ICD-10-CM

## 2017-09-05 DIAGNOSIS — I2510 Atherosclerotic heart disease of native coronary artery without angina pectoris: Secondary | ICD-10-CM | POA: Diagnosis not present

## 2017-09-05 DIAGNOSIS — G4733 Obstructive sleep apnea (adult) (pediatric): Secondary | ICD-10-CM | POA: Insufficient documentation

## 2017-09-05 DIAGNOSIS — E785 Hyperlipidemia, unspecified: Secondary | ICD-10-CM | POA: Diagnosis not present

## 2017-09-05 DIAGNOSIS — I4510 Unspecified right bundle-branch block: Secondary | ICD-10-CM | POA: Diagnosis not present

## 2017-09-05 DIAGNOSIS — R001 Bradycardia, unspecified: Secondary | ICD-10-CM | POA: Diagnosis not present

## 2017-09-05 DIAGNOSIS — Z7901 Long term (current) use of anticoagulants: Secondary | ICD-10-CM | POA: Insufficient documentation

## 2017-09-05 DIAGNOSIS — K449 Diaphragmatic hernia without obstruction or gangrene: Secondary | ICD-10-CM | POA: Insufficient documentation

## 2017-09-05 DIAGNOSIS — Z79899 Other long term (current) drug therapy: Secondary | ICD-10-CM | POA: Diagnosis not present

## 2017-09-05 DIAGNOSIS — I272 Pulmonary hypertension, unspecified: Secondary | ICD-10-CM | POA: Diagnosis not present

## 2017-09-05 DIAGNOSIS — K219 Gastro-esophageal reflux disease without esophagitis: Secondary | ICD-10-CM | POA: Diagnosis not present

## 2017-09-05 DIAGNOSIS — I110 Hypertensive heart disease with heart failure: Secondary | ICD-10-CM | POA: Insufficient documentation

## 2017-09-05 DIAGNOSIS — F419 Anxiety disorder, unspecified: Secondary | ICD-10-CM | POA: Diagnosis not present

## 2017-09-05 DIAGNOSIS — I059 Rheumatic mitral valve disease, unspecified: Secondary | ICD-10-CM

## 2017-09-05 DIAGNOSIS — Z9981 Dependence on supplemental oxygen: Secondary | ICD-10-CM | POA: Diagnosis not present

## 2017-09-05 LAB — LIPID PANEL
CHOL/HDL RATIO: 2.2 ratio
Cholesterol: 175 mg/dL (ref 0–200)
HDL: 79 mg/dL (ref >40)
LDL Cholesterol: 76 mg/dL (ref 0–99)
TRIGLYCERIDES: 99 mg/dL (ref <150)
VLDL: 20 mg/dL (ref 0–40)

## 2017-09-05 LAB — BASIC METABOLIC PANEL
ANION GAP: 12 (ref 5–15)
BUN: 22 mg/dL — ABNORMAL HIGH (ref 6–20)
CHLORIDE: 91 mmol/L — AB (ref 101–111)
CO2: 35 mmol/L — ABNORMAL HIGH (ref 22–32)
Calcium: 10.2 mg/dL (ref 8.9–10.3)
Creatinine, Ser: 1.34 mg/dL — ABNORMAL HIGH (ref 0.44–1.00)
GFR calc Af Amer: 43 mL/min — ABNORMAL LOW (ref >60)
GFR, EST NON AFRICAN AMERICAN: 37 mL/min — AB (ref >60)
Glucose, Bld: 105 mg/dL — ABNORMAL HIGH (ref 65–99)
POTASSIUM: 4.3 mmol/L (ref 3.5–5.1)
SODIUM: 138 mmol/L (ref 135–145)

## 2017-09-05 LAB — CBC
HCT: 38.5 % (ref 36.0–46.0)
HEMOGLOBIN: 11.9 g/dL — AB (ref 12.0–15.0)
MCH: 29.4 pg (ref 26.0–34.0)
MCHC: 30.9 g/dL (ref 30.0–36.0)
MCV: 95.1 fL (ref 78.0–100.0)
Platelets: 224 10*3/uL (ref 150–400)
RBC: 4.05 MIL/uL (ref 3.87–5.11)
RDW: 13 % (ref 11.5–15.5)
WBC: 7.7 10*3/uL (ref 4.0–10.5)

## 2017-09-05 MED ORDER — TORSEMIDE 20 MG PO TABS: 60.0000 mg | ORAL_TABLET | Freq: Two times a day (BID) | ORAL | 6 refills | Status: DC

## 2017-09-05 NOTE — Patient Instructions (Signed)
Increase Torsemide 80 mg (4 tabs) in AM, then 60 mg (3 tabs) in PM  Then take 60 mg (3 tabs), twice a day  Your physician has requested that you have a lower or upper extremity venous duplex. This test is an ultrasound of the veins in the legs or arms. It looks at venous blood flow that carries blood from the heart to the legs or arms. Allow one hour for a Lower Venous exam. Allow thirty minutes for an Upper Venous exam. There are no restrictions or special instructions.  Labs drawn today (if we do not call you, then your lab work was stable)   Your physician recommends that you return for lab work in: 10 days   Your physician has requested that you have an echocardiogram. Echocardiography is a painless test that uses sound waves to create images of your heart. It provides your doctor with information about the size and shape of your heart and how well your heart's chambers and valves are working. This procedure takes approximately one hour. There are no restrictions for this procedure. (they will call you)  Your physician recommends that you schedule a follow-up appointment in: 3 weeks with [facility]

## 2017-09-06 ENCOUNTER — Other Ambulatory Visit (HOSPITAL_COMMUNITY): Payer: Self-pay | Admitting: *Deleted

## 2017-09-06 ENCOUNTER — Ambulatory Visit (HOSPITAL_COMMUNITY)
Admission: RE | Admit: 2017-09-06 | Discharge: 2017-09-06 | Disposition: A | Discharge status: Home / Self Care | Payer: Medicare Other | Source: Ambulatory Visit | Attending: Family Medicine | Admitting: Family Medicine

## 2017-09-06 DIAGNOSIS — Z86718 Personal history of other venous thrombosis and embolism: Secondary | ICD-10-CM | POA: Insufficient documentation

## 2017-09-06 DIAGNOSIS — I824Y9 Acute embolism and thrombosis of unspecified deep veins of unspecified proximal lower extremity: Secondary | ICD-10-CM

## 2017-09-06 DIAGNOSIS — I5022 Chronic systolic (congestive) heart failure: Secondary | ICD-10-CM | POA: Insufficient documentation

## 2017-09-06 DIAGNOSIS — M7989 Other specified soft tissue disorders: Secondary | ICD-10-CM | POA: Insufficient documentation

## 2017-09-06 NOTE — Progress Notes (Signed)
[facility] venous duplex prelim: negative for DVT. Farrel Demark, RDMS, RVT Gave results to Dr. Shirlee Latch

## 2017-09-06 NOTE — Progress Notes (Signed)
Patient ID: [name], female   DOB: Jul 16, 1941, 77 y.o.   MRN: [id number]    Advanced Heart Failure Clinic Note   PCP: Dr Valentina Lucks  Pulmonary: Dr Maple Hudson HF Cardiology: Dr. Shirlee Latch  HPI: [name] is a 77 y.o. female with a history of chronic diastolic CHF, moderate pulmonary HTN, HTN, CAD s/p CABG, COPD on home oxygen, and dyslipidemia referred to the HF clinic by Dr Mayford Knife.    Admitted 3/2 - 09/30/15 with A/C CHF. TEE was done to assess mitral stenosis, appeared mild to moderate, rheumatic valve.  TEE also showed aortic valve vegetation and concern arose for endocarditis with recent "tooth infection" per pt.  ID saw. 1 of 2 admission cultures with Gram + cocci in clusters thought to be contaminant.  2 further sets of BCx negative. RHC showed elevated right and left heart filling pressures.  Aortic valve was not crossed for full mitral stenosis assessment due to aortic valve vegetation. Diuresed 8 lbs. Discharge weight 175 lbs.   Due to increased dyspnea and exertional chest pain, she had RHC/LHC in 7/17.  Bypass grafts were patent but there was 90% stenosis in the distal LCx that could account for angina.  It was a small caliber vessel and not amenable to PCI. Filling pressures were near-normal on RHC.   Admitted 11/18 through 06/18/16 with chest pain and increased dyspnea. Exacerbation was thought to be from A fib RVR. She underwent successful DC/CV on 11/19. Diuresed with IV lasix and transitioned to  Discharged on amio 200 mg twice a day+ xarelto 15 mg daily. Discharge weight was 175 pounds.    Last echo in 12/17 was stable with EF 55-60% and mild to moderate mitral stenosis, mild MR, mild AS.    She returns for followup of CHF. She continues to use 2L oxygen by nasal cannula.  She is not walking much.  She is short of breath walking around in her house.  Dyspnea has been worse for several months.  She has orthopnea, sleeps on at least 2 pillows and sometimes in recliner.  No BRBPR/melena.   No chest pain.  Weight is up 14 lbs since last appointment here.    ECG (personally reviewed): NSR, LAFB, RBBB   - ECHO 12/2014 EF 55-60%, peak PA pressure 68 mmHg, mild to moderate MS. - ECHO 2/17 EF 60-65%, mild LVH, mildly dilated RV with mildly decreased systolic function, PASP 29 mmHg (not sure we got a full envelope), moderate mitral stenosis with mean gradient 6 mmHg and MVA 1.37 cm^2, mild MR, mild AI, aortic sclerosis without significant stenosis.   - TEE 09/24/15 EF 60%, small 0.8 cm mobile mass on the LV side of the aortic valve. Mild AI and AS. 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 A small PFO with a weakly positive bubble study, Moderate TR - Echo (12/17): EF 55-60%, mild aortic stenosis, mild aortic insufficiency, rheumatic mitral valve with mild to moderate mitral stenosis (MVA 1.6 cm^2, mean gradient 8 mmHg), mild MR, moderate TR, PASP 68 mmHg.   PFTs (2/17): moderate-severe obstructive airways disease.   Sleep study 2017 was negative, no significant OSA.   RHC 09/24/15 (did not do LHC and cross valve to assess mitral stenosis due to aortic valve vegetation): Hemodynamics (mmHg) RA mean 16 RV 65/14 PA 71/28, mean 45 PCWP mean 32 Cardiac Output (Fick) 4.85  Cardiac Index (Fick) 2.51 PVR 2.68 WU  LHC/RHC (7/17): 50% pLAD, patent  LIMA-LAD, totally occluded PLOM, patent SVG-PLOM, AV LCx distal to PLOM with 90% stenosis (small caliber), totally occluded PLV, patent SVG-PLV.  RA mean 5 PA 43/13 PCWP mean 17 CI 2.7 PVR 1.9 WU Transient CHB when LV was entered, so unable to measure gradient across MV.   Labs 01/09/2015 K 4.3 Creatinine 0.91 BNP  Labs 02/19/2015 K 4.1 Creatinine 1.03 Labs 2/17 LDL 82 Labs 10/05/15 K 3.8, Creatinine 0.89  Labs 10/08/15 K 4.6, Creatinine 0.7 Labs 6/17 K 4.3, creatinine 0.95, HCT 32.7, BNP 221 Labs 7/17 BNP 286, K 4.2, creatinine 1.0, HCT 31.2 Labs 10/17 K 3.5, creatinine 1.18, LDL 78 Labs 1/18 K  4.2, creatinine 1.5 Labs 3/18: BNP 175, K 3.5, creatinine 1.54  Past Medical History:  Diagnosis Date  . Acute parotitis 01/05/2016  . Anxiety   . Bacteremia, escherichia coli   . Bursitis, shoulder    bilateral  . CHF (congestive heart failure) (HCC)    chronic diasotlic CHF  . Complication of anesthesia    "have trouble getting me awake sometimes; been ok latelhy" (09/24/2015)  . COPD (chronic obstructive pulmonary disease) (HCC)   . Coronary heart disease 2001   s/p CABG  . Depression   . DJD (degenerative joint disease)   . GERD (gastroesophageal reflux disease)   . H/O hiatal hernia   . History of rheumatic heart disease    X 3-LAST TIME WHEN PATIENT WAS 77 YRS OLD  . Hyperlipidemia   . Hypertension   . OA (osteoarthritis)   . On home oxygen therapy    "2L; 24/7" (09/24/2015)  . PUD (peptic ulcer disease)   . Pulmonary HTN (HCC)    PASP 50-67mmHg by QAST4/1962  . Pulmonary HTN (HCC)   . Sleep apnea    intolerant to CPAP  . Thyroid nodule   . Urinary incontinence   . Varicose veins     Social History: The patient  reports that she quit smoking about 7 years ago. Her smoking use included Cigarettes. She has a 47 pack-year smoking history. She has never used smokeless tobacco. She reports that she does not drink alcohol or use illicit drugs.   Family History: The patient's family history includes Anemia in her mother; Emphysema in her father; Esophageal cancer in her daughter.   ROS: All systems negative except as listed in HPI, PMH and Problem List.  Current Outpatient Medications  Medication Sig Dispense Refill  . acetaminophen (TYLENOL) 500 MG tablet Take 1,000 mg by mouth 2 (two) times daily as needed for headache.     Marland Kitchen amiodarone (PACERONE) 200 MG tablet Take 1 tablet (200 mg total) by mouth daily. 30 tablet 6  . amLODipine (NORVASC) 2.5 MG tablet TAKE 1 TABLET BY MOUTH  DAILY 90 tablet 3  . benzonatate (TESSALON) 200 MG capsule Take 1 capsule (200 mg total) by  mouth 3 (three) times daily as needed for cough. 90 capsule 0  . calcium-vitamin D (OSCAL WITH D) 500-200 MG-UNIT per tablet Take 2 tablets by mouth daily with breakfast.    . ipratropium (ATROVENT) 0.02 % nebulizer solution USE 1 VIAL VIA NEBULIZER  EVERY 6 HOURS AS NEEDED FOR WHEEZING OR SHORTNESS OF  BREATH 812.5 mL 0  . IRON PO Take 1 tablet by mouth daily.    . isosorbide mononitrate (IMDUR) 60 MG 24 hr tablet Take 1 tablet (60 mg total) daily by mouth. 90 tablet 3  . loratadine (CLARITIN) 10 MG tablet Take 10 mg by mouth at bedtime as  needed for allergies.     . metoprolol tartrate (LOPRESSOR) 50 MG tablet Take 1 tablet (50 mg total) by mouth 2 (two) times daily. 14 tablet 0  . NITROSTAT 0.4 MG SL tablet place 1 tablet under the tongue if needed every 5 minutes for chest pain for 3 doses IF NO RELIEF AFTER 3RD DOSE CALL PRESCRIBER OR 911. 25 tablet 0  . omeprazole (PRILOSEC OTC) 20 MG tablet Take 20 mg by mouth at bedtime as needed (GERD).     . OXYGEN Inhale 2-3 L into the lungs continuous.     . potassium chloride SA (K-DUR,KLOR-CON) 20 MEQ tablet TAKE 2 TABLETS BY MOUTH TWO TIMES DAILY 360 tablet 6  . Rivaroxaban (XARELTO) 15 MG TABS tablet Take 1 tablet (15 mg total) by mouth daily with supper. 90 tablet 3  . sertraline (ZOLOFT) 100 MG tablet Take 100 mg by mouth at bedtime.     . simvastatin (ZOCOR) 20 MG tablet TAKE 3 TABLETS BY MOUTH  DAILY AT 6 PM 270 tablet 3  . spironolactone (ALDACTONE) 25 MG tablet TAKE 1 TABLET BY MOUTH  DAILY 90 tablet 3  . torsemide (DEMADEX) 20 MG tablet Take 3 tablets (60 mg total) by mouth 2 (two) times daily. 180 tablet 6  . umeclidinium-vilanterol (ANORO ELLIPTA) 62.5-25 MCG/INH AEPB Inhale 1 puff into the lungs daily. 60 each 12  . umeclidinium-vilanterol (ANORO ELLIPTA) 62.5-25 MCG/INH AEPB Inhale 1 puff into the lungs daily. 1 each 0   No current facility-administered medications for this encounter.     Vitals:   09/05/17 1445  BP: (!) 140/52    Pulse: (!) 55  SpO2: 95%  Weight: 199 lb 12.8 oz (90.6 kg)   Wt Readings from Last 3 Encounters:  09/05/17 199 lb 12.8 oz (90.6 kg)  06/26/17 189 lb 12.8 oz (86.1 kg)  01/30/17 187 lb (84.8 kg)     PHYSICAL EXAM: General: NAD, frail Neck: JVP 8 cm with HJR, no thyromegaly or thyroid nodule.  Lungs: Clear to auscultation bilaterally with normal respiratory effort. CV: Nondisplaced PMI.  Heart regular S1/S2, no S3/S4, 2/6 SEM RUSB with clear S2.  1+ edema of foot and 3/4 to knee on left, trace edema at right ankle.  No carotid bruit.  Normal pedal pulses.  Abdomen: Soft, nontender, no hepatosplenomegaly, no distention.  Skin: Intact without lesions or rashes.  Neurologic: Alert and oriented x 3.  Psych: Normal affect. Extremities: No clubbing or cyanosis.  HEENT: Normal.   ASSESSMENT & PLAN: 1. Chronic diastolic CHF/valvular heart disease: TEE in 3/17 showed normal EF, mild to moderate mitral stenosis.  Most recent RHC in 7/17 showed near-normal filling pressures.  Last echo in 11/17 showed normal EF, mild-moderate MS with mild MR, mild AS with mild AI.  Today, NYHA IIIb symptoms, worse over the last few months.  She is volume overloaded on exam but not markedly. Weight is up.  COPD plays a role in her dyspnea in addition to CHF.    - Increase torsemide to 80 qam/60 qpm x 3 days then torsemide 60 mg bid after that.  BMET today and again in 10 days.   - Continue 25 mg spironolactone daily. - I will arrange for an echo.  2. CAD S/P CABG:  She has potential source of angina from 90% stenosis in distal LCx.  The vessel is small at this point and not amenable to PCI.  No chest pain.  - Continue statin, check lipids.  - On Xarelto  so no Aspirin. - Continue Imdur.  3. Atrial fibrillation: Paroxysmal. CHADSVASC score is 4 (age > 74, female, HTN, CHF).  S/P DC-CV 06/12/16. Regular pulse.  - Continue amio to 200 mg once a day. WIll need to check TSH, LFTS. She understands she should get get  yearly eye exam.  - Continue Xarelto/metoprolol.  4. Pulmonary HTN: This is primarily pulmonary venous hypertension based on most recent RHC. 5. COPD: Followed by Dr Maple Hudson. PFTs in 2/17 with moderate to severe obstruction.  On 2 liters [place]. She has not tolerated inhalers well.  6.  OSA: intolerant CPAP 7.  Mitral stenosis: Rheumatic mitral stenosis.  Mild to moderate mitral stenosis by TEE in 3/17.  Unable to assess by cath in 3/17 because she had aortic valve vegetation and the valve was not crossed. Unable to assess by cath in 7/17 because she had transient complete heart block when the LV was entered. Treating medically for now.  Would be high risk for surgery with severe COPD.  However, possibly could be valvuloplasty candidate.    8. Aortic valve disease: Mild AI and mild AS on last echo.  9. Asymmetric lower extremity edema: Markedly greater swelling on the left.  Will get venous dopplers to rule out DVT (doubt given Xarelto use).   Marca Ancona 09/06/2017

## 2017-09-08 ENCOUNTER — Encounter (HOSPITAL_COMMUNITY): Payer: Self-pay

## 2017-09-12 ENCOUNTER — Other Ambulatory Visit: Payer: Self-pay

## 2017-09-12 ENCOUNTER — Ambulatory Visit (HOSPITAL_COMMUNITY): Payer: Medicare Other | Attending: Cardiology

## 2017-09-12 DIAGNOSIS — E785 Hyperlipidemia, unspecified: Secondary | ICD-10-CM | POA: Insufficient documentation

## 2017-09-12 DIAGNOSIS — I5032 Chronic diastolic (congestive) heart failure: Secondary | ICD-10-CM | POA: Insufficient documentation

## 2017-09-12 DIAGNOSIS — I110 Hypertensive heart disease with heart failure: Secondary | ICD-10-CM | POA: Diagnosis not present

## 2017-09-12 DIAGNOSIS — Z87891 Personal history of nicotine dependence: Secondary | ICD-10-CM | POA: Insufficient documentation

## 2017-09-12 DIAGNOSIS — I2510 Atherosclerotic heart disease of native coronary artery without angina pectoris: Secondary | ICD-10-CM | POA: Insufficient documentation

## 2017-09-12 DIAGNOSIS — I082 Rheumatic disorders of both aortic and tricuspid valves: Secondary | ICD-10-CM | POA: Insufficient documentation

## 2017-09-12 DIAGNOSIS — G4733 Obstructive sleep apnea (adult) (pediatric): Secondary | ICD-10-CM | POA: Insufficient documentation

## 2017-09-15 ENCOUNTER — Ambulatory Visit (HOSPITAL_COMMUNITY)
Admission: RE | Admit: 2017-09-15 | Discharge: 2017-09-15 | Disposition: A | Discharge status: Home / Self Care | Payer: Medicare Other | Source: Ambulatory Visit | Attending: Cardiology | Admitting: Cardiology

## 2017-09-15 DIAGNOSIS — I5032 Chronic diastolic (congestive) heart failure: Secondary | ICD-10-CM | POA: Insufficient documentation

## 2017-09-15 LAB — BASIC METABOLIC PANEL
ANION GAP: 12 (ref 5–15)
BUN: 22 mg/dL — ABNORMAL HIGH (ref 6–20)
CALCIUM: 9.4 mg/dL (ref 8.9–10.3)
CO2: 35 mmol/L — AB (ref 22–32)
Chloride: 94 mmol/L — ABNORMAL LOW (ref 101–111)
Creatinine, Ser: 1.6 mg/dL — ABNORMAL HIGH (ref 0.44–1.00)
GFR, EST AFRICAN AMERICAN: 35 mL/min — AB (ref >60)
GFR, EST NON AFRICAN AMERICAN: 30 mL/min — AB (ref >60)
Glucose, Bld: 108 mg/dL — ABNORMAL HIGH (ref 65–99)
Potassium: 3.9 mmol/L (ref 3.5–5.1)
Sodium: 141 mmol/L (ref 135–145)

## 2017-09-20 ENCOUNTER — Other Ambulatory Visit (HOSPITAL_COMMUNITY): Payer: Self-pay | Admitting: Pharmacist

## 2017-09-20 MED ORDER — AMIODARONE HCL 200 MG PO TABS: 200.0000 mg | ORAL_TABLET | Freq: Every day | ORAL | 3 refills | Status: DC

## 2017-09-20 MED ORDER — TORSEMIDE 20 MG PO TABS: 60.0000 mg | ORAL_TABLET | Freq: Two times a day (BID) | ORAL | 3 refills | Status: DC

## 2017-09-26 ENCOUNTER — Ambulatory Visit (HOSPITAL_COMMUNITY)
Admission: RE | Admit: 2017-09-26 | Discharge: 2017-09-26 | Disposition: A | Discharge status: Home / Self Care | Payer: Medicare Other | Source: Ambulatory Visit | Attending: Cardiology | Admitting: Cardiology

## 2017-09-26 ENCOUNTER — Encounter (HOSPITAL_COMMUNITY): Payer: Self-pay

## 2017-09-26 VITALS — BP 142/64 | HR 59 | Wt 196.0 lb

## 2017-09-26 DIAGNOSIS — G4733 Obstructive sleep apnea (adult) (pediatric): Secondary | ICD-10-CM | POA: Diagnosis not present

## 2017-09-26 DIAGNOSIS — J449 Chronic obstructive pulmonary disease, unspecified: Secondary | ICD-10-CM | POA: Diagnosis not present

## 2017-09-26 DIAGNOSIS — M1990 Unspecified osteoarthritis, unspecified site: Secondary | ICD-10-CM | POA: Diagnosis not present

## 2017-09-26 DIAGNOSIS — I480 Paroxysmal atrial fibrillation: Secondary | ICD-10-CM | POA: Diagnosis not present

## 2017-09-26 DIAGNOSIS — I10 Essential (primary) hypertension: Secondary | ICD-10-CM | POA: Diagnosis not present

## 2017-09-26 DIAGNOSIS — K449 Diaphragmatic hernia without obstruction or gangrene: Secondary | ICD-10-CM | POA: Insufficient documentation

## 2017-09-26 DIAGNOSIS — I358 Other nonrheumatic aortic valve disorders: Secondary | ICD-10-CM | POA: Insufficient documentation

## 2017-09-26 DIAGNOSIS — I272 Pulmonary hypertension, unspecified: Secondary | ICD-10-CM | POA: Diagnosis not present

## 2017-09-26 DIAGNOSIS — F329 Major depressive disorder, single episode, unspecified: Secondary | ICD-10-CM | POA: Diagnosis not present

## 2017-09-26 DIAGNOSIS — Z87891 Personal history of nicotine dependence: Secondary | ICD-10-CM | POA: Insufficient documentation

## 2017-09-26 DIAGNOSIS — Z7901 Long term (current) use of anticoagulants: Secondary | ICD-10-CM | POA: Insufficient documentation

## 2017-09-26 DIAGNOSIS — I519 Heart disease, unspecified: Secondary | ICD-10-CM | POA: Diagnosis not present

## 2017-09-26 DIAGNOSIS — I38 Endocarditis, valve unspecified: Secondary | ICD-10-CM | POA: Insufficient documentation

## 2017-09-26 DIAGNOSIS — F419 Anxiety disorder, unspecified: Secondary | ICD-10-CM | POA: Insufficient documentation

## 2017-09-26 DIAGNOSIS — I5032 Chronic diastolic (congestive) heart failure: Secondary | ICD-10-CM

## 2017-09-26 DIAGNOSIS — Z8711 Personal history of peptic ulcer disease: Secondary | ICD-10-CM | POA: Diagnosis not present

## 2017-09-26 DIAGNOSIS — Z955 Presence of coronary angioplasty implant and graft: Secondary | ICD-10-CM | POA: Insufficient documentation

## 2017-09-26 DIAGNOSIS — I050 Rheumatic mitral stenosis: Secondary | ICD-10-CM

## 2017-09-26 DIAGNOSIS — E785 Hyperlipidemia, unspecified: Secondary | ICD-10-CM | POA: Insufficient documentation

## 2017-09-26 DIAGNOSIS — Z9981 Dependence on supplemental oxygen: Secondary | ICD-10-CM | POA: Insufficient documentation

## 2017-09-26 DIAGNOSIS — I482 Chronic atrial fibrillation, unspecified: Secondary | ICD-10-CM

## 2017-09-26 DIAGNOSIS — I4510 Unspecified right bundle-branch block: Secondary | ICD-10-CM | POA: Diagnosis not present

## 2017-09-26 DIAGNOSIS — Z79899 Other long term (current) drug therapy: Secondary | ICD-10-CM | POA: Insufficient documentation

## 2017-09-26 DIAGNOSIS — K219 Gastro-esophageal reflux disease without esophagitis: Secondary | ICD-10-CM | POA: Insufficient documentation

## 2017-09-26 DIAGNOSIS — Z951 Presence of aortocoronary bypass graft: Secondary | ICD-10-CM | POA: Diagnosis not present

## 2017-09-26 DIAGNOSIS — J9611 Chronic respiratory failure with hypoxia: Secondary | ICD-10-CM | POA: Diagnosis not present

## 2017-09-26 DIAGNOSIS — K047 Periapical abscess without sinus: Secondary | ICD-10-CM | POA: Insufficient documentation

## 2017-09-26 DIAGNOSIS — I2510 Atherosclerotic heart disease of native coronary artery without angina pectoris: Secondary | ICD-10-CM | POA: Diagnosis not present

## 2017-09-26 DIAGNOSIS — I110 Hypertensive heart disease with heart failure: Secondary | ICD-10-CM | POA: Diagnosis present

## 2017-09-26 LAB — BASIC METABOLIC PANEL
Anion gap: 10 (ref 5–15)
BUN: 24 mg/dL — AB (ref 6–20)
CALCIUM: 9.5 mg/dL (ref 8.9–10.3)
CO2: 38 mmol/L — ABNORMAL HIGH (ref 22–32)
CREATININE: 1.36 mg/dL — AB (ref 0.44–1.00)
Chloride: 94 mmol/L — ABNORMAL LOW (ref 101–111)
GFR calc Af Amer: 42 mL/min — ABNORMAL LOW (ref >60)
GFR, EST NON AFRICAN AMERICAN: 36 mL/min — AB (ref >60)
Glucose, Bld: 97 mg/dL (ref 65–99)
POTASSIUM: 4 mmol/L (ref 3.5–5.1)
SODIUM: 142 mmol/L (ref 135–145)

## 2017-09-26 MED ORDER — TORSEMIDE 20 MG PO TABS: 60.0000 mg | ORAL_TABLET | Freq: Two times a day (BID) | ORAL | 3 refills | Status: DC

## 2017-09-26 NOTE — Progress Notes (Signed)
Patient ID: FYNN ADEL, female   DOB: 1940/09/23, 77 y.o.   MRN: [id number]    Advanced Heart Failure Clinic Note   PCP: Dr Valentina Lucks  Pulmonary: Dr Maple Hudson HF Cardiology: Dr. Shirlee Latch  HPI: [name] is a 77 y.o. female with a history of chronic diastolic CHF, moderate pulmonary HTN, HTN, CAD s/p CABG, COPD on home oxygen, and dyslipidemia referred to the HF clinic by Dr Mayford Knife.    Admitted 3/2 - 09/30/15 with A/C CHF. TEE was done to assess mitral stenosis, appeared mild to moderate, rheumatic valve.  TEE also showed aortic valve vegetation and concern arose for endocarditis with recent "tooth infection" per pt.  ID saw. 1 of 2 admission cultures with Gram + cocci in clusters thought to be contaminant.  2 further sets of BCx negative. RHC showed elevated right and left heart filling pressures.  Aortic valve was not crossed for full mitral stenosis assessment due to aortic valve vegetation. Diuresed 8 lbs. Discharge weight 175 lbs.   Due to increased dyspnea and exertional chest pain, she had RHC/LHC in 7/17.  Bypass grafts were patent but there was 90% stenosis in the distal LCx that could account for angina.  It was a small caliber vessel and not amenable to PCI. Filling pressures were near-normal on RHC.   Admitted 11/18 through 06/18/16 with chest pain and increased dyspnea. Exacerbation was thought to be from A fib RVR. She underwent successful DC/CV on 11/19. Diuresed with IV lasix and transitioned to  Discharged on amio 200 mg twice a day+ xarelto 15 mg daily. Discharge weight was 175 pounds.    Last echo in 12/17 was stable with EF 55-60% and mild to moderate mitral stenosis, mild MR, mild AS.    She returns today for regular follow up. Last visit torsemide increased, but there was some misunderstanding, and she only increased for several days. She has lost 3 lbs, but feels about the same. SOB walking around the house. She has chronic 2 pillow orthopnea. She has tried several inhalers, but  has had skin reactions so is limited to what she can take. She denies CP, BRBPR, or melena.   ECG (personally reviewed): NSR, LAFB, RBBB   - ECHO 12/2014 EF 55-60%, peak PA pressure 68 mmHg, mild to moderate MS. - ECHO 2/17 EF 60-65%, mild LVH, mildly dilated RV with mildly decreased systolic function, PASP 29 mmHg (not sure we got a full envelope), moderate mitral stenosis with mean gradient 6 mmHg and MVA 1.37 cm^2, mild MR, mild AI, aortic sclerosis without significant stenosis.   - TEE 09/24/15 EF 60%, small 0.8 cm mobile mass on the LV side of the aortic valve. Mild AI and AS. 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 A small PFO with a weakly positive bubble study, Moderate TR - Echo (12/17): EF 55-60%, mild aortic stenosis, mild aortic insufficiency, rheumatic mitral valve with mild to moderate mitral stenosis (MVA 1.6 cm^2, mean gradient 8 mmHg), mild MR, moderate TR, PASP 68 mmHg.   PFTs (2/17): moderate-severe obstructive airways disease.   Sleep study 2017 was negative, no significant OSA.   RHC 09/24/15 (did not do LHC and cross valve to assess mitral stenosis due to aortic valve vegetation): Hemodynamics (mmHg) RA mean 16 RV 65/14 PA 71/28, mean 45 PCWP mean 32 Cardiac Output (Fick) 4.85  Cardiac Index (Fick) 2.51 PVR 2.68 WU  LHC/RHC (7/17): 50% pLAD, patent LIMA-LAD, totally occluded PLOM,  patent SVG-PLOM, AV LCx distal to PLOM with 90% stenosis (small caliber), totally occluded PLV, patent SVG-PLV.  RA mean 5 PA 43/13 PCWP mean 17 CI 2.7 PVR 1.9 WU Transient CHB when LV was entered, so unable to measure gradient across MV.   Labs 01/09/2015 K 4.3 Creatinine 0.91 BNP  Labs 02/19/2015 K 4.1 Creatinine 1.03 Labs 2/17 LDL 82 Labs 10/05/15 K 3.8, Creatinine 0.89  Labs 10/08/15 K 4.6, Creatinine 0.7 Labs 6/17 K 4.3, creatinine 0.95, HCT 32.7, BNP 221 Labs 7/17 BNP 286, K 4.2, creatinine 1.0, HCT 31.2 Labs 10/17 K 3.5, creatinine  1.18, LDL 78 Labs 1/18 K 4.2, creatinine 1.5 Labs 3/18: BNP 175, K 3.5, creatinine 1.54  Past Medical History:  Diagnosis Date  . Acute parotitis 01/05/2016  . Anxiety   . Bacteremia, escherichia coli   . Bursitis, shoulder    bilateral  . CHF (congestive heart failure) (HCC)    chronic diasotlic CHF  . Complication of anesthesia    "have trouble getting me awake sometimes; been ok latelhy" (09/24/2015)  . COPD (chronic obstructive pulmonary disease) (HCC)   . Coronary heart disease 2001   s/p CABG  . Depression   . DJD (degenerative joint disease)   . GERD (gastroesophageal reflux disease)   . H/O hiatal hernia   . History of rheumatic heart disease    X 3-LAST TIME WHEN PATIENT WAS 77 YRS OLD  . Hyperlipidemia   . Hypertension   . OA (osteoarthritis)   . On home oxygen therapy    "2L; 24/7" (09/24/2015)  . PUD (peptic ulcer disease)   . Pulmonary HTN (HCC)    PASP 50-39mmHg by [id number]  . Pulmonary HTN (HCC)   . Sleep apnea    intolerant to CPAP  . Thyroid nodule   . Urinary incontinence   . Varicose veins     Social History: The patient  reports that she quit smoking about 7 years ago. Her smoking use included Cigarettes. She has a 47 pack-year smoking history. She has never used smokeless tobacco. She reports that she does not drink alcohol or use illicit drugs.   Family History: The patient's family history includes Anemia in her mother; Emphysema in her father; Esophageal cancer in her daughter.   Review of systems complete and found to be negative unless listed in HPI.    Current Outpatient Medications  Medication Sig Dispense Refill  . acetaminophen (TYLENOL) 500 MG tablet Take 1,000 mg by mouth 2 (two) times daily as needed for headache.     Marland Kitchen amiodarone (PACERONE) 200 MG tablet Take 1 tablet (200 mg total) by mouth daily. 90 tablet 3  . amLODipine (NORVASC) 2.5 MG tablet TAKE 1 TABLET BY MOUTH  DAILY 90 tablet 3  . benzonatate (TESSALON) 200 MG capsule Take  1 capsule (200 mg total) by mouth 3 (three) times daily as needed for cough. 90 capsule 0  . calcium-vitamin D (OSCAL WITH D) 500-200 MG-UNIT per tablet Take 2 tablets by mouth daily with breakfast.    . ipratropium (ATROVENT) 0.02 % nebulizer solution USE 1 VIAL VIA NEBULIZER  EVERY 6 HOURS AS NEEDED FOR WHEEZING OR SHORTNESS OF  BREATH 812.5 mL 0  . IRON PO Take 1 tablet by mouth daily.    . isosorbide mononitrate (IMDUR) 60 MG 24 hr tablet Take 1 tablet (60 mg total) daily by mouth. 90 tablet 3  . loratadine (CLARITIN) 10 MG tablet Take 10 mg by mouth at bedtime as needed for  allergies.     . metoprolol tartrate (LOPRESSOR) 50 MG tablet Take 1 tablet (50 mg total) by mouth 2 (two) times daily. 14 tablet 0  . NITROSTAT 0.4 MG SL tablet place 1 tablet under the tongue if needed every 5 minutes for chest pain for 3 doses IF NO RELIEF AFTER 3RD DOSE CALL PRESCRIBER OR 911. 25 tablet 0  . omeprazole (PRILOSEC OTC) 20 MG tablet Take 20 mg by mouth at bedtime as needed (GERD).     . OXYGEN Inhale 2-3 L into the lungs continuous.     . potassium chloride SA (K-DUR,KLOR-CON) 20 MEQ tablet TAKE 2 TABLETS BY MOUTH TWO TIMES DAILY 360 tablet 6  . Rivaroxaban (XARELTO) 15 MG TABS tablet Take 1 tablet (15 mg total) by mouth daily with supper. 90 tablet 3  . sertraline (ZOLOFT) 100 MG tablet Take 100 mg by mouth at bedtime.     . simvastatin (ZOCOR) 20 MG tablet TAKE 3 TABLETS BY MOUTH  DAILY AT 6 PM 270 tablet 3  . spironolactone (ALDACTONE) 25 MG tablet TAKE 1 TABLET BY MOUTH  DAILY 90 tablet 3  . torsemide (DEMADEX) 20 MG tablet Take 3 tablets (60 mg total) by mouth 2 (two) times daily. 540 tablet 3  . umeclidinium-vilanterol (ANORO ELLIPTA) 62.5-25 MCG/INH AEPB Inhale 1 puff into the lungs daily. 60 each 12  . umeclidinium-vilanterol (ANORO ELLIPTA) 62.5-25 MCG/INH AEPB Inhale 1 puff into the lungs daily. 1 each 0   No current facility-administered medications for this encounter.     Vitals:    09/26/17 1510  BP: (!) 142/64  Pulse: (!) 59  SpO2: 92%  Weight: 196 lb (88.9 kg)   Wt Readings from Last 3 Encounters:  09/26/17 196 lb (88.9 kg)  09/05/17 199 lb 12.8 oz (90.6 kg)  06/26/17 189 lb 12.8 oz (86.1 kg)     PHYSICAL EXAM: General: Elderly appearing. NAD.  HEENT: Normal Neck: Supple. JVP 8-9 cm. Carotids 2+ bilat; no bruits. No thyromegaly or nodule noted. Cor: PMI non-displaced. 2/6 SEM RUSB with clear S2.  Lungs: CTAB, normal effort. Abdomen: Soft, non-tender, non-distended, no HSM. No bruits or masses. +BS  Extremities: No cyanosis, clubbing, or rash. Trace ankle edema.  Neuro: Alert & orientedx3, cranial nerves grossly intact. moves all 4 extremities w/o difficulty. Affect pleasant   ASSESSMENT & PLAN: 1. Chronic diastolic CHF/valvular heart disease: TEE in 3/17 showed normal EF, mild to moderate mitral stenosis.  Most recent RHC in 7/17 showed near-normal filling pressures.  Last echo in 11/17 showed normal EF, mild-moderate MS with mild MR, mild AS with mild AI.   - Repeat Echo 09/12/17 with EF 55-60%, Grade 3, Mild AI. - NYHA III-IIIb - Increase torsemide to 60 mg BID. Take 80 mg tomorrow am. BMET today.   - Continue 25 mg spironolactone daily. - Reinforced fluid restriction to < 2 L daily, sodium restriction to less than 2000 mg daily, and the importance of daily weights.   2. CAD S/P CABG:  She has potential source of angina from 90% stenosis in distal LCx.  The vessel is small at this point and not amenable to PCI.   - No s/s of ischemia.  - Continue statin. Lipids OK 09/05/17 - On Xarelto so no Aspirin. - Continue Imdur.  3. Atrial fibrillation: Paroxysmal. CHADSVASC score is 4 (age > 26, female, HTN, CHF).  S/P DC-CV 06/12/16.  - Regular on exam.  - Continue amio to 200 mg once a day.  -  Needs TSH and LFTs. She understands she should get get yearly eye exam.  - Continue Xarelto/metoprolol.  4. Pulmonary HTN: This is primarily pulmonary venous  hypertension based on most recent RHC. 5. COPD: Followed by Dr Maple Hudson. PFTs in 2/17 with moderate to severe obstruction.   - On O2 2 lpm via [place]. On 2 liters [place].  6.  OSA: - Intolerant CPAP.  7.  Mitral stenosis: Rheumatic mitral stenosis.  Mild to moderate mitral stenosis by TEE in 3/17.  Unable to assess by cath in 3/17 because she had aortic valve vegetation and the valve was not crossed. Unable to assess by cath in 7/17 because she had transient complete heart block when the LV was entered. Treating medically for now.  Would be high risk for surgery with severe COPD.  - Mild to moderate on Echo 09/12/17. 8. Aortic valve disease:  - Mild AI and mild AS  9. Asymmetric lower extremity edema:  - Resolved with diuresis. DVT negative.   Meds and labs as above. RTC 4 weeks.   Monica Freer, PA-C 09/26/2017  Greater than 50% of the 25 minute visit was spent in counseling/coordination of care regarding disease state education, salt/fluid restriction, sliding scale diuretics, and medication compliance.

## 2017-09-26 NOTE — Patient Instructions (Signed)
Routine lab work today. Will notify you of abnormal results, otherwise no news is good news!  INCREASE Torsemide as follows: Tomorrow only: Take 80 mg (4 tabs) in am and 60 mg (3 tabs) in pm. After tomorrow, take 60 mg (3 tabs) twice daily every day until follow up appointment.  Follow up 4 weeks with Otilio Saber PA-C.  ____________________________________________________ [place] Code: 1100  Take all medication as prescribed the day of your appointment. Bring all medications with you to your appointment.  Do the following things EVERYDAY: 1) Weigh yourself in the morning before breakfast. Write it down and keep it in a log. 2) Take your medicines as prescribed 3) Eat low salt foods-Limit salt (sodium) to 2000 mg per day.  4) Stay as active as you can everyday 5) Limit all fluids for the day to less than 2 liters

## 2017-10-13 ENCOUNTER — Telehealth: Payer: Self-pay | Admitting: Internal Medicine

## 2017-10-13 NOTE — Telephone Encounter (Signed)
Called and spoke with pt's daughter, Asher Muir.  Asher Muir was attempting to 3 way pt for a verbal to speak with her, as Asher Muir is not on DPR. Call was disconnected.  ATC Jamie back, unable to leave vm due to mailbox not being setup.

## 2017-10-13 NOTE — Telephone Encounter (Signed)
Spoke with [name]  She put pt on the phone and she gave okay to speak with her  She states that EMS was called to the home this morning b/c the pt had a fall  She is having a hard time with her breathing after the fall and EMS gave her a treatment called "accustat" She states that this helped and now the pt wants to have this at home  She believes that this was some sort of neb tx  She is asking if we can prescribe it  I advised we can ask CDY if he is familiar with it, but he is unavailable until 10/16/17  She states that this is fine to wait until then  Please advise thanks!  Allergies  Allergen Reactions  . Lisinopril Cough    Developed persistent/fry cough for a month.   . Prednisone Rash    jittery  . Tape Other (See Comments)    Bleeding  . Xopenex [Levalbuterol] Other (See Comments)    Made mouth and throat sore  . Cefpodoxime Other (See Comments)    Yeast infections   . Codeine Other (See Comments)    Anxious/ jittery  . Lipitor [Atorvastatin] Other (See Comments)    Made joint start hurting/ Muscle aches  . Other Swelling    Venholin HFA  . Penicillins Other (See Comments)    Has patient had a PCN reaction causing immediate rash, facial/tongue/throat swelling, SOB or lightheadedness with hypotension: unknown, childhood reaction Has patient had a PCN reaction causing severe rash involving mucus membranes or skin necrosis: No Has patient had a PCN reaction that required hospitalization No Has patient had a PCN reaction occurring within the last 10 years: No If all of the above answers are "NO", then may proceed with Cephalosporin use.   Terald Sleeper [Kdc:Albuterol] Other (See Comments)    "jittery"  . Anoro Ellipta [Umeclidinium-Vilanterol]     itchy  . Clindamycin Other (See Comments)    Unknown   . Hydrocodone-Acetaminophen Anxiety  . Latex Itching and Rash  . Levalbuterol Hcl Other (See Comments)    Unknown    Current Outpatient Medications on File Prior to Visit   Medication Sig Dispense Refill  . acetaminophen (TYLENOL) 500 MG tablet Take 1,000 mg by mouth 2 (two) times daily as needed for headache.     Marland Kitchen amiodarone (PACERONE) 200 MG tablet Take 1 tablet (200 mg total) by mouth daily. 90 tablet 3  . amLODipine (NORVASC) 2.5 MG tablet TAKE 1 TABLET BY MOUTH  DAILY 90 tablet 3  . benzonatate (TESSALON) 200 MG capsule Take 1 capsule (200 mg total) by mouth 3 (three) times daily as needed for cough. 90 capsule 0  . calcium-vitamin D (OSCAL WITH D) 500-200 MG-UNIT per tablet Take 2 tablets by mouth daily with breakfast.    . ipratropium (ATROVENT) 0.02 % nebulizer solution USE 1 VIAL VIA NEBULIZER  EVERY 6 HOURS AS NEEDED FOR WHEEZING OR SHORTNESS OF  BREATH 812.5 mL 0  . IRON PO Take 1 tablet by mouth daily.    . isosorbide mononitrate (IMDUR) 60 MG 24 hr tablet Take 1 tablet (60 mg total) daily by mouth. 90 tablet 3  . loratadine (CLARITIN) 10 MG tablet Take 10 mg by mouth at bedtime as needed for allergies.     . metoprolol tartrate (LOPRESSOR) 50 MG tablet Take 1 tablet (50 mg total) by mouth 2 (two) times daily. 14 tablet 0  . NITROSTAT 0.4 MG SL tablet place 1  tablet under the tongue if needed every 5 minutes for chest pain for 3 doses IF NO RELIEF AFTER 3RD DOSE CALL PRESCRIBER OR 911. 25 tablet 0  . omeprazole (PRILOSEC OTC) 20 MG tablet Take 20 mg by mouth at bedtime as needed (GERD).     . OXYGEN Inhale 2-3 L into the lungs continuous.     . potassium chloride SA (K-DUR,KLOR-CON) 20 MEQ tablet TAKE 2 TABLETS BY MOUTH TWO TIMES DAILY 360 tablet 6  . Rivaroxaban (XARELTO) 15 MG TABS tablet Take 1 tablet (15 mg total) by mouth daily with supper. 90 tablet 3  . sertraline (ZOLOFT) 100 MG tablet Take 100 mg by mouth at bedtime.     . simvastatin (ZOCOR) 20 MG tablet TAKE 3 TABLETS BY MOUTH  DAILY AT 6 PM 270 tablet 3  . spironolactone (ALDACTONE) 25 MG tablet TAKE 1 TABLET BY MOUTH  DAILY 90 tablet 3  . torsemide (DEMADEX) 20 MG tablet Take 3 tablets (60  mg total) by mouth 2 (two) times daily. 540 tablet 3  . umeclidinium-vilanterol (ANORO ELLIPTA) 62.5-25 MCG/INH AEPB Inhale 1 puff into the lungs daily. 60 each 12  . umeclidinium-vilanterol (ANORO ELLIPTA) 62.5-25 MCG/INH AEPB Inhale 1 puff into the lungs daily. 1 each 0   No current facility-administered medications on file prior to visit.

## 2017-10-13 NOTE — Telephone Encounter (Signed)
Called patients daughter, unable to reach. Busy tone rang. Will call back.

## 2017-10-13 NOTE — Telephone Encounter (Signed)
Pt daughter returning call.[name] ° °

## 2017-10-14 NOTE — Telephone Encounter (Signed)
Im not sure what this refers to. Can we call one of the ERs, and ask what it is?

## 2017-10-16 MED ORDER — ALBUTEROL SULFATE (2.5 MG/3ML) 0.083% IN NEBU: 2.5000 mg | INHALATION_SOLUTION | Freq: Four times a day (QID) | RESPIRATORY_TRACT | 12 refills | Status: DC | PRN

## 2017-10-16 NOTE — Telephone Encounter (Signed)
Spoke with pt's daughter, Asher Muir. She is aware of CY's recommendation. Rx has been sent in. Nothing further was needed.

## 2017-10-16 NOTE — Telephone Encounter (Signed)
Ok to add albuterol 0.083% neb solution, # 75 ml,   1 neb every 6 hours if needed, refill x 12.  I think she has a nebulizer machine. She should be able to get this through the same DME on Medicare.

## 2017-10-16 NOTE — Telephone Encounter (Signed)
Spoke with pt's daughter, Asher Muir. She states that she was mistaken, they actually gave her albuterol treatment. Asher Muir would like for the pt to have this on hand.  CY - please advise. Thanks.

## 2017-10-19 ENCOUNTER — Emergency Department (HOSPITAL_COMMUNITY): Payer: Medicare Other

## 2017-10-19 ENCOUNTER — Emergency Department (HOSPITAL_COMMUNITY)
Admission: EM | Admit: 2017-10-19 | Discharge: 2017-10-20 | Disposition: A | Discharge status: Home / Self Care | Payer: Medicare Other | Attending: Emergency Medicine | Admitting: Emergency Medicine

## 2017-10-19 ENCOUNTER — Encounter (HOSPITAL_COMMUNITY): Payer: Self-pay

## 2017-10-19 ENCOUNTER — Other Ambulatory Visit: Payer: Self-pay

## 2017-10-19 DIAGNOSIS — R1032 Left lower quadrant pain: Secondary | ICD-10-CM | POA: Insufficient documentation

## 2017-10-19 DIAGNOSIS — Z7901 Long term (current) use of anticoagulants: Secondary | ICD-10-CM | POA: Insufficient documentation

## 2017-10-19 DIAGNOSIS — R51 Headache: Secondary | ICD-10-CM | POA: Diagnosis present

## 2017-10-19 DIAGNOSIS — M5442 Lumbago with sciatica, left side: Secondary | ICD-10-CM | POA: Insufficient documentation

## 2017-10-19 DIAGNOSIS — I2510 Atherosclerotic heart disease of native coronary artery without angina pectoris: Secondary | ICD-10-CM | POA: Diagnosis not present

## 2017-10-19 DIAGNOSIS — S060X0A Concussion without loss of consciousness, initial encounter: Secondary | ICD-10-CM

## 2017-10-19 DIAGNOSIS — Y929 Unspecified place or not applicable: Secondary | ICD-10-CM | POA: Insufficient documentation

## 2017-10-19 DIAGNOSIS — Y999 Unspecified external cause status: Secondary | ICD-10-CM | POA: Diagnosis not present

## 2017-10-19 DIAGNOSIS — I10 Essential (primary) hypertension: Secondary | ICD-10-CM | POA: Insufficient documentation

## 2017-10-19 DIAGNOSIS — W1830XA Fall on same level, unspecified, initial encounter: Secondary | ICD-10-CM | POA: Insufficient documentation

## 2017-10-19 DIAGNOSIS — Y939 Activity, unspecified: Secondary | ICD-10-CM | POA: Insufficient documentation

## 2017-10-19 DIAGNOSIS — Z79899 Other long term (current) drug therapy: Secondary | ICD-10-CM | POA: Diagnosis not present

## 2017-10-19 DIAGNOSIS — I5032 Chronic diastolic (congestive) heart failure: Secondary | ICD-10-CM | POA: Insufficient documentation

## 2017-10-19 DIAGNOSIS — Z91040 Latex allergy status: Secondary | ICD-10-CM | POA: Diagnosis not present

## 2017-10-19 DIAGNOSIS — Z87891 Personal history of nicotine dependence: Secondary | ICD-10-CM | POA: Diagnosis not present

## 2017-10-19 DIAGNOSIS — Z951 Presence of aortocoronary bypass graft: Secondary | ICD-10-CM | POA: Diagnosis not present

## 2017-10-19 DIAGNOSIS — R0602 Shortness of breath: Secondary | ICD-10-CM | POA: Diagnosis not present

## 2017-10-19 DIAGNOSIS — R0789 Other chest pain: Secondary | ICD-10-CM | POA: Diagnosis not present

## 2017-10-19 DIAGNOSIS — M5432 Sciatica, left side: Secondary | ICD-10-CM

## 2017-10-19 DIAGNOSIS — J449 Chronic obstructive pulmonary disease, unspecified: Secondary | ICD-10-CM | POA: Insufficient documentation

## 2017-10-19 DIAGNOSIS — G44319 Acute post-traumatic headache, not intractable: Secondary | ICD-10-CM | POA: Diagnosis not present

## 2017-10-19 LAB — CBC WITH DIFFERENTIAL/PLATELET
Basophils Absolute: 0 10*3/uL (ref 0.0–0.1)
Basophils Relative: 0 %
EOS ABS: 0.3 10*3/uL (ref 0.0–0.7)
EOS PCT: 3 %
HCT: 36.4 % (ref 36.0–46.0)
HEMOGLOBIN: 11.2 g/dL — AB (ref 12.0–15.0)
Lymphocytes Relative: 23 %
Lymphs Abs: 2 10*3/uL (ref 0.7–4.0)
MCH: 29.3 pg (ref 26.0–34.0)
MCHC: 30.8 g/dL (ref 30.0–36.0)
MCV: 95.3 fL (ref 78.0–100.0)
MONOS PCT: 11 %
Monocytes Absolute: 1 10*3/uL (ref 0.1–1.0)
NEUTROS PCT: 63 %
Neutro Abs: 5.3 10*3/uL (ref 1.7–7.7)
PLATELETS: 235 10*3/uL (ref 150–400)
RBC: 3.82 MIL/uL — ABNORMAL LOW (ref 3.87–5.11)
RDW: 13.4 % (ref 11.5–15.5)
WBC: 8.6 10*3/uL (ref 4.0–10.5)

## 2017-10-19 LAB — COMPREHENSIVE METABOLIC PANEL
ALBUMIN: 3.6 g/dL (ref 3.5–5.0)
ALT: 16 U/L (ref 14–54)
ANION GAP: 10 (ref 5–15)
AST: 22 U/L (ref 15–41)
Alkaline Phosphatase: 81 U/L (ref 38–126)
BUN: 21 mg/dL — ABNORMAL HIGH (ref 6–20)
CO2: 37 mmol/L — AB (ref 22–32)
Calcium: 9.5 mg/dL (ref 8.9–10.3)
Chloride: 95 mmol/L — ABNORMAL LOW (ref 101–111)
Creatinine, Ser: 1.3 mg/dL — ABNORMAL HIGH (ref 0.44–1.00)
GFR calc non Af Amer: 38 mL/min — ABNORMAL LOW (ref >60)
GFR, EST AFRICAN AMERICAN: 44 mL/min — AB (ref >60)
GLUCOSE: 105 mg/dL — AB (ref 65–99)
POTASSIUM: 3.9 mmol/L (ref 3.5–5.1)
Sodium: 142 mmol/L (ref 135–145)
Total Bilirubin: 0.2 mg/dL — ABNORMAL LOW (ref 0.3–1.2)
Total Protein: 7.4 g/dL (ref 6.5–8.1)

## 2017-10-19 LAB — I-STAT TROPONIN, ED: TROPONIN I, POC: 0.01 ng/mL (ref 0.00–0.08)

## 2017-10-19 LAB — LIPASE, BLOOD: Lipase: 51 U/L (ref 11–51)

## 2017-10-19 MED ORDER — ONDANSETRON HCL 4 MG/2ML IJ SOLN
4.0000 mg | Freq: Once | INTRAMUSCULAR | Status: AC
Start: 2017-10-19 — End: 2017-10-19
  Administered 2017-10-19: 4 mg via INTRAVENOUS
  Filled 2017-10-19: qty 2

## 2017-10-19 MED ORDER — MORPHINE SULFATE (PF) 2 MG/ML IV SOLN
2.0000 mg | Freq: Once | INTRAVENOUS | Status: AC
  Administered 2017-10-19: 2 mg via INTRAVENOUS
  Filled 2017-10-19: qty 1

## 2017-10-19 NOTE — ED Triage Notes (Addendum)
Per EMS, pt from home. Pt had a fall last week and hit her head. Pt denied help at the time of fall. New and worsening pain in her left hip has brought her here today as well as headaches. Per EMS, pt c/o tenderness in the back of her head. Pt reports taking Xarelto.

## 2017-10-19 NOTE — ED Provider Notes (Signed)
[facility]-EMERGENCY DEPT Provider Note   CSN: [id number] Arrival date & time: 10/19/17  2055     History   Chief Complaint Chief Complaint  Patient presents with  . Headache    HPI [name] is a 77 y.o. female.  77 yo F with a chief complaint of a headache and low back pain.  This is been going on for the past week.  The patient had a mechanical fall where she struck the back of her head.  She is on Xarelto.  Since then she has had persistent headaches as well as pain on the left side of her back that radiates down the leg.  She has had some difficulty walking and pain with bearing weight to the left leg.  She denies recurrent fall.  She is also been having some shortness of breath associated with this.  She feels that she has chest pain to the posterior aspect of bilateral ribs.  She is on oxygen at home.  The history is provided by the patient.  Headache   Pertinent negatives include no fever, no palpitations, no shortness of breath, no nausea and no vomiting.  Illness  This is a new problem. The current episode started more than 2 days ago. The problem occurs constantly. The problem has been gradually worsening. Pertinent negatives include no chest pain, no headaches and no shortness of breath. Nothing aggravates the symptoms. Nothing relieves the symptoms. She has tried nothing for the symptoms. The treatment provided no relief.    Past Medical History:  Diagnosis Date  . Acute parotitis 01/05/2016  . Anxiety   . Bacteremia, escherichia coli   . Bursitis, shoulder    bilateral  . CHF (congestive heart failure) (HCC)    chronic diasotlic CHF  . Complication of anesthesia    "have trouble getting me awake sometimes; been ok latelhy" (09/24/2015)  . COPD (chronic obstructive pulmonary disease) (HCC)   . Coronary heart disease 2001   s/p CABG  . Depression   . DJD (degenerative joint disease)   . GERD (gastroesophageal reflux disease)   . H/O  hiatal hernia   . History of rheumatic heart disease    X 3-LAST TIME WHEN PATIENT WAS 77 YRS OLD  . Hyperlipidemia   . Hypertension   . OA (osteoarthritis)   . On home oxygen therapy    "2L; 24/7" (09/24/2015)  . PUD (peptic ulcer disease)   . Pulmonary HTN (HCC)    PASP 50-95mmHg by [id number]  . Pulmonary HTN (HCC)   . Sleep apnea    intolerant to CPAP  . Thyroid nodule   . Urinary incontinence   . Varicose veins     Patient Active Problem List   Diagnosis Date Noted  . Lung nodule 11/07/2016  . Mitral valve stenosis   . Rheumatic aortic valve disease   . Atrial fibrillation (HCC) 06/12/2016  . Absolute anemia   . Chronic respiratory failure with hypoxia (HCC) 04/20/2016  . Rheumatic mitral valve disease   . PFO (patent foramen ovale)   . Cardiomyopathy (HCC)   . Chronic atrial fibrillation (HCC)   . Alcoholic cardiomyopathy (HCC)   . Aortic valve vegetation 09/24/2015  . Lumbar spine scoliosis 04/18/2015  . Mitral stenosis 02/10/2015  . Chronic diastolic heart failure (HCC) 05/10/2013  . Atrial flutter (HCC) 05/10/2013  . Chronic anticoagulation 05/10/2013  . HTN (hypertension) 05/10/2013  . Dyslipidemia, goal LDL below 70 05/10/2013  . Seasonal allergic rhinitis 04/11/2012  .  DYSPNEA 03/14/2008  . Coronary atherosclerosis 02/27/2008  . COPD mixed type (HCC) 02/27/2008  . Pulmonary hypertension (HCC) 02/21/2008  . DIASTOLIC DYSFUNCTION 02/21/2008    Past Surgical History:  Procedure Laterality Date  . BILATERAL SALPINGOOPHORECTOMY    . CARDIAC CATHETERIZATION  02/11/2008, 01/30/2004, 06/14/2000  . CARDIAC CATHETERIZATION N/A 09/24/2015   Procedure: Right Heart Cath;  Surgeon: Laurey Morale, MD;  Location: [facility] INVASIVE CV LAB;  Service: Cardiovascular;  Laterality: N/A;  . CARDIAC CATHETERIZATION N/A 02/08/2016   Procedure: Right/Left Heart Cath and Coronary/Graft Angiography;  Surgeon: Laurey Morale, MD;  Location: [facility] INVASIVE CV LAB;  Service: Cardiovascular;   Laterality: N/A;  . CARDIOVERSION N/A 06/12/2016   Procedure: CARDIOVERSION;  Surgeon: Duke Salvia, MD;  Location: [facility] OR;  Service: Cardiovascular;  Laterality: N/A;  . CORONARY ARTERY BYPASS GRAFT  06/21/2000   x4  . INCONTINENCE SURGERY     "tacked"  . TEE WITHOUT CARDIOVERSION N/A 09/24/2015   Procedure: TRANSESOPHAGEAL ECHOCARDIOGRAM (TEE);  Surgeon: Laurey Morale, MD;  Location: [facility] ENDOSCOPY;  Service: Cardiovascular;  Laterality: N/A;  . THYROIDECTOMY, PARTIAL  2010   Dr Michaell Cowing  . TOTAL ABDOMINAL HYSTERECTOMY  1990   1990  . TUBAL LIGATION    . VAGINAL DELIVERY  x4     OB History   None      Home Medications    Prior to Admission medications   Medication Sig Start Date End Date Taking? Authorizing Provider  acetaminophen (TYLENOL) 500 MG tablet Take 1,000 mg by mouth 2 (two) times daily as needed for headache.     [provider]  albuterol (PROVENTIL) (2.5 MG/3ML) 0.083% nebulizer solution Take 3 mLs (2.5 mg total) by nebulization every 6 (six) hours as needed for wheezing or shortness of breath. 10/16/17   Jetty Duhamel D, MD  amiodarone (PACERONE) 200 MG tablet Take 1 tablet (200 mg total) by mouth daily. 09/20/17   Laurey Morale, MD  amLODipine (NORVASC) 2.5 MG tablet TAKE 1 TABLET BY MOUTH  DAILY 01/12/17   Laurey Morale, MD  benzonatate (TESSALON) 200 MG capsule Take 1 capsule (200 mg total) by mouth 3 (three) times daily as needed for cough. 08/26/16   Clegg, Amy D, NP  calcium-vitamin D (OSCAL WITH D) 500-200 MG-UNIT per tablet Take 2 tablets by mouth daily with breakfast.    [provider]  diclofenac sodium (VOLTAREN) 1 % GEL Apply 2 g topically 4 (four) times daily. 10/20/17   Melene Plan, DO  ipratropium (ATROVENT) 0.02 % nebulizer solution USE 1 VIAL VIA NEBULIZER  EVERY 6 HOURS AS NEEDED FOR WHEEZING OR SHORTNESS OF  BREATH 07/17/17   Parrett, Tammy S, NP  IRON PO Take 1 tablet by mouth daily.    [provider]  isosorbide  mononitrate (IMDUR) 60 MG 24 hr tablet Take 1 tablet (60 mg total) daily by mouth. 05/30/17   Laurey Morale, MD  loratadine (CLARITIN) 10 MG tablet Take 10 mg by mouth at bedtime as needed for allergies.     [provider]  metoprolol tartrate (LOPRESSOR) 50 MG tablet Take 1 tablet (50 mg total) by mouth 2 (two) times daily. 09/01/17   Laurey Morale, MD  morphine (MSIR) 15 MG tablet Take 1 tablet (15 mg total) by mouth every 4 (four) hours as needed for severe pain. 10/20/17   Melene Plan, DO  NITROSTAT 0.4 MG SL tablet place 1 tablet under the tongue if needed every 5 minutes for  chest pain for 3 doses IF NO RELIEF AFTER 3RD DOSE CALL PRESCRIBER OR 911. 01/21/16   Quintella Reichert, MD  omeprazole (PRILOSEC OTC) 20 MG tablet Take 20 mg by mouth at bedtime as needed (GERD).     [provider]  OXYGEN Inhale 2-3 L into the lungs continuous.     [provider]  potassium chloride SA (K-DUR,KLOR-CON) 20 MEQ tablet TAKE 2 TABLETS BY MOUTH TWO TIMES DAILY 11/28/16   Bensimhon, Bevelyn Buckles, MD  Rivaroxaban (XARELTO) 15 MG TABS tablet Take 1 tablet (15 mg total) by mouth daily with supper. 03/13/17   Laurey Morale, MD  sertraline (ZOLOFT) 100 MG tablet Take 100 mg by mouth at bedtime.     [provider]  simvastatin (ZOCOR) 20 MG tablet TAKE 3 TABLETS BY MOUTH  DAILY AT 6 PM 05/30/17   Laurey Morale, MD  spironolactone (ALDACTONE) 25 MG tablet TAKE 1 TABLET BY MOUTH  DAILY 05/30/17   Laurey Morale, MD  torsemide (DEMADEX) 20 MG tablet Take 3 tablets (60 mg total) by mouth 2 (two) times daily. 09/26/17   Graciella Freer, PA-C  umeclidinium-vilanterol (ANORO ELLIPTA) 62.5-25 MCG/INH AEPB Inhale 1 puff into the lungs daily. 06/26/17   Young, Joni Fears D, MD  umeclidinium-vilanterol (ANORO ELLIPTA) 62.5-25 MCG/INH AEPB Inhale 1 puff into the lungs daily. 06/27/17   Waymon Budge, MD    Family History Family History  Problem Relation Age of Onset  . Anemia Mother     . Emphysema Father   . Esophageal cancer Daughter     Social History Social History   Tobacco Use  . Smoking status: Former Smoker    Packs/day: 1.00    Years: 47.00    Pack years: 47.00    Types: Cigarettes    Last attempt to quit: 07/26/2007    Years since quitting: 10.2  . Smokeless tobacco: Never Used  Substance Use Topics  . Alcohol use: No  . Drug use: No     Allergies   Lisinopril; Prednisone; Tape; Xopenex [levalbuterol]; Cefpodoxime; Codeine; Lipitor [atorvastatin]; Other; Penicillins; Ventolin [kdc:albuterol]; Anoro ellipta [umeclidinium-vilanterol]; Clindamycin; Hydrocodone-acetaminophen; Latex; and Levalbuterol hcl   Review of Systems Review of Systems  Constitutional: Negative for chills and fever.  HENT: Negative for congestion and rhinorrhea.   Eyes: Negative for redness and visual disturbance.  Respiratory: Negative for shortness of breath and wheezing.   Cardiovascular: Negative for chest pain and palpitations.  Gastrointestinal: Negative for nausea and vomiting.  Genitourinary: Negative for dysuria and urgency.  Musculoskeletal: Positive for arthralgias, back pain and myalgias.  Skin: Negative for pallor and wound.  Neurological: Negative for dizziness and headaches.     Physical Exam Updated Vital Signs BP 117/84 (BP Location: Left Arm)   Pulse (!) 54   Temp 97.9 F (36.6 C) (Oral)   Resp 20   Ht 5\' 6"  (1.676 m)   Wt 88 kg (194 lb)   SpO2 100%   BMI 31.31 kg/m   Physical Exam  Constitutional: She is oriented to person, place, and time. She appears well-developed and well-nourished. No distress.  Old bruises to the occiput.    HENT:  Head: Normocephalic and atraumatic.  Eyes: Pupils are equal, round, and reactive to light. EOM are normal.  Neck: Normal range of motion. Neck supple.  Cardiovascular: Normal rate and regular rhythm. Exam reveals no gallop and no friction rub.  No murmur heard. Pulmonary/Chest: Effort normal. She has no  wheezes. She has  no rales.  Abdominal: Soft. She exhibits no distension. There is no tenderness.  Musculoskeletal: She exhibits tenderness. She exhibits no edema.  Diffusely tender about the low back.  Worse on the left than the right.  Pulse motor and sensation is intact to the left lower extremity  Neurological: She is alert and oriented to person, place, and time.  Skin: Skin is warm and dry. She is not diaphoretic.  Psychiatric: She has a normal mood and affect. Her behavior is normal.  Nursing note and vitals reviewed.    ED Treatments / Results  Labs (all labs ordered are listed, but only abnormal results are displayed) Labs Reviewed  CBC WITH DIFFERENTIAL/PLATELET - Abnormal; Notable for the following components:      Result Value   RBC 3.82 (*)    Hemoglobin 11.2 (*)    All other components within normal limits  COMPREHENSIVE METABOLIC PANEL - Abnormal; Notable for the following components:   Chloride 95 (*)    CO2 37 (*)    Glucose, Bld 105 (*)    BUN 21 (*)    Creatinine, Ser 1.30 (*)    Total Bilirubin 0.2 (*)    GFR calc non Af Amer 38 (*)    GFR calc Af Amer 44 (*)    All other components within normal limits  LIPASE, BLOOD  I-STAT TROPONIN, ED    EKG EKG Interpretation  Date/Time:  Thursday October 19 2017 22:59:01 EDT Ventricular Rate:  62 PR Interval:    QRS Duration: 137 QT Interval:  515 QTC Calculation: 524 R Axis:   -82 Text Interpretation:  Ectopic atrial rhythm RBBB and LAFB LVH with secondary repolarization abnormality Inferior infarct, acute No significant change since last tracing Confirmed by Melene Plan [phone]) on 10/19/2017 11:21:03 PM   Radiology Dg Chest 2 View  Result Date: 10/19/2017 CLINICAL DATA:  Shortness of breath EXAM: CHEST - 2 VIEW COMPARISON:  Chest radiograph 06/15/2016 Chest CT 01/23/2017 FINDINGS: Cardiomegaly with remote median sternotomy for CABG. Left basilar atelectasis. No other focal consolidation. Upper lobe pulmonary  venous diversion without overt edema. No pleural effusion or pneumothorax. IMPRESSION: Cardiomegaly without overt pulmonary edema. Electronically Signed   By: Deatra Robinson M.D.   On: 10/19/2017 22:28   Ct Head Wo Contrast  Result Date: 10/19/2017 CLINICAL DATA:  Larey Seat and hit head, headache EXAM: CT HEAD WITHOUT CONTRAST TECHNIQUE: Contiguous axial images were obtained from the base of the skull through the vertex without intravenous contrast. COMPARISON:  None. FINDINGS: Brain: No acute territorial infarction, hemorrhage or intracranial mass. Mild to moderate atrophy. Prominent ventricles felt secondary to atrophy. Minimal small vessel ischemic change of the white matter Vascular: No hyperdense vessels.  Carotid vascular calcification. Skull: Normal. Negative for fracture or focal lesion. Sinuses/Orbits: No acute finding. Other: None IMPRESSION: No CT evidence for acute intracranial abnormality. Atrophy and minimal small vessel ischemic changes of the white matter Electronically Signed   By: Jasmine Pang M.D.   On: 10/19/2017 21:58   Ct Lumbar Spine Wo Contrast  Result Date: 10/19/2017 CLINICAL DATA:  77 year old female with fall and left hip pain. EXAM: CT LUMBAR SPINE WITHOUT CONTRAST TECHNIQUE: Multidetector CT imaging of the lumbar spine was performed without intravenous contrast administration. Multiplanar CT image reconstructions were also generated. COMPARISON:  Abdominal CT dated 10/19/2017 and lumbar spine MRI dated 06/24/2015 FINDINGS: Segmentation: 5 lumbar type vertebrae. Alignment: There is no acute subluxation. There is grade 1 L4-L5 anterolisthesis. Vertebrae: Compression deformity of the superior endplate of  T12 with approximately 50% loss of vertebral body height and anterior wedging as seen previously. No acute fracture of the lumbar spine. The bones are osteopenic. The posterior elements are intact. Posterior fusion at L4-L5 again seen. Paraspinal and other soft tissues: No acute  findings. Disc levels: There are multilevel degenerative changes with osteophyte primarily at L2-L3. Bilateral L4-L5 and L5-S1 mild neural foramina narrowing. No change in the L3 meningocele. IMPRESSION: 1. No acute/traumatic lumbar spine pathology. 2. Stable old T12 compression fracture. 3. Status post posterior fusion at L4 or L5. Electronically Signed   By: Elgie Collard M.D.   On: 10/19/2017 22:54   Ct Renal Stone Study  Result Date: 10/19/2017 CLINICAL DATA:  77 year old female with recent fall and back pain. Concern for renal calculus. EXAM: CT ABDOMEN AND PELVIS WITHOUT CONTRAST TECHNIQUE: Multidetector CT imaging of the abdomen and pelvis was performed following the standard protocol without IV contrast. COMPARISON:  Lumbar spine CT dated 10/19/2017 and MRI dated 06/24/2015 FINDINGS: Evaluation of this exam is limited in the absence of intravenous contrast. Lower chest: There are bibasilar linear atelectasis/scarring. There is mild cardiomegaly. Multi vessel coronary vascular calcification and postsurgical changes of CABG. There is no intra-abdominal free air or free fluid Hepatobiliary: No focal liver abnormality is seen. No gallstones, gallbladder wall thickening, or biliary dilatation. Pancreas: Unremarkable. No pancreatic ductal dilatation or surrounding inflammatory changes. Spleen: Normal in size without focal abnormality. Adrenals/Urinary Tract: The adrenal glands are unremarkable. There is a punctate nonobstructing left renal interpolar stone. The right kidney is unremarkable. Bilateral extrarenal pelvis with mild pelviectasis. The visualized ureters and urinary bladder appear unremarkable. Stomach/Bowel: There is sigmoid diverticulosis without active inflammatory changes. There is no bowel obstruction or active inflammation. The appendix is normal. Vascular/Lymphatic: Advanced aortoiliac atherosclerotic disease. No portal venous gas. There is no adenopathy. Reproductive: Hysterectomy. Small  amount of fluid in the vaginal cuff. No pelvic mass. Other: None Musculoskeletal: Degenerative changes of the spine. L4-L5 posterior fusion. Stable appearing small L3 meningocele. No acute fracture. Old T12 compression fracture. IMPRESSION: 1. Punctate nonobstructing left renal interpolar stone. No hydronephrosis. 2. Sigmoid diverticulosis. No bowel obstruction or active inflammation. Normal appendix. 3.  Aortic Atherosclerosis (ICD10-I70.0). Electronically Signed   By: Elgie Collard M.D.   On: 10/19/2017 22:59    Procedures Procedures (including critical care time)  Medications Ordered in ED Medications  albuterol (PROVENTIL HFA;VENTOLIN HFA) 108 (90 Base) MCG/ACT inhaler 2 puff (has no administration in time range)  morphine 2 MG/ML injection 2 mg (2 mg Intravenous Given 10/19/17 2304)  ondansetron (ZOFRAN) injection 4 mg (4 mg Intravenous Given 10/19/17 2304)     Initial Impression / Assessment and Plan / ED Course  I have reviewed the triage vital signs and the nursing notes.  Pertinent labs & imaging results that were available during my care of the patient were reviewed by me and considered in my medical decision making (see chart for details).     77 yO F with a chief complaint of headache and back pain post fall about a week ago.  CT of the head is normal.  CT of the abdomen pelvis and of the L-spine is unremarkable as well.  Chest x-ray negative for pneumonia or broken ribs.  Labs are unremarkable as well.  Patient reassessed and feeling much better.  Suspect that this is sciatica.  Also postconcussive syndrome.  We will give the patient is a small amount of pain medicine prescription for Voltaren gel.  Have her take Tylenol  around-the-clock Voltaren gel as needed and then pain medicine as needed.  Follow-up with a PCP.  12:14 AM:  I have discussed the diagnosis/risks/treatment options with the patient and family and believe the pt to be eligible for discharge home to follow-up  with PCP. We also discussed returning to the ED immediately if new or worsening sx occur. We discussed the sx which are most concerning (e.g., sudden worsening pain, fever, inability to tolerate by mouth, cauda equina signs or symptoms) that necessitate immediate return. Medications administered to the patient during their visit and any new prescriptions provided to the patient are listed below.  Medications given during this visit Medications  albuterol (PROVENTIL HFA;VENTOLIN HFA) 108 (90 Base) MCG/ACT inhaler 2 puff (has no administration in time range)  morphine 2 MG/ML injection 2 mg (2 mg Intravenous Given 10/19/17 2304)  ondansetron (ZOFRAN) injection 4 mg (4 mg Intravenous Given 10/19/17 2304)     The patient appears reasonably screen and/or stabilized for discharge and I doubt any other medical condition or other [facility] requiring further screening, evaluation, or treatment in the ED at this time prior to discharge.    Final Clinical Impressions(s) / ED Diagnoses   Final diagnoses:  Sciatica of left side  Concussion without loss of consciousness, initial encounter    ED Discharge Orders        Ordered    morphine (MSIR) 15 MG tablet  Every 4 hours PRN     10/20/17 0010    diclofenac sodium (VOLTAREN) 1 % GEL  4 times daily     10/20/17 0010       Melene Plan, DO 10/20/17 0015

## 2017-10-20 MED ORDER — MORPHINE SULFATE 15 MG PO TABS: 15.0000 mg | ORAL_TABLET | ORAL | 0 refills | Status: DC | PRN

## 2017-10-20 MED ORDER — ALBUTEROL SULFATE HFA 108 (90 BASE) MCG/ACT IN AERS
2.0000 | INHALATION_SPRAY | Freq: Once | RESPIRATORY_TRACT | Status: AC
  Administered 2017-10-20: 2 via RESPIRATORY_TRACT
  Filled 2017-10-20: qty 6.7

## 2017-10-20 MED ORDER — DICLOFENAC SODIUM 1 % TD GEL: 2.0000 g | Freq: Four times a day (QID) | TRANSDERMAL | 0 refills | Status: DC

## 2017-10-20 NOTE — Discharge Instructions (Signed)
Take tylenol 1000mg(2 extra strength) four times a day.  ° °Then take the pain medicine if you feel like you need it. Narcotics do not help with the pain, they only make you care about it less.  You can become addicted to this, people may break into your house to steal it.  It will constipate you.  If you drive under the influence of this medicine you can get a DUI.   ° °

## 2017-10-24 ENCOUNTER — Encounter (HOSPITAL_COMMUNITY): Payer: Medicare Other

## 2017-10-26 ENCOUNTER — Other Ambulatory Visit (HOSPITAL_COMMUNITY): Payer: Self-pay | Admitting: Pharmacist

## 2017-10-26 MED ORDER — RIVAROXABAN 15 MG PO TABS: 15.0000 mg | ORAL_TABLET | Freq: Every day | ORAL | 3 refills | Status: AC

## 2017-11-01 ENCOUNTER — Ambulatory Visit (INDEPENDENT_AMBULATORY_CARE_PROVIDER_SITE_OTHER)
Admission: RE | Admit: 2017-11-01 | Discharge: 2017-11-01 | Disposition: A | Discharge status: Home / Self Care | Payer: Medicare Other | Source: Ambulatory Visit | Attending: Internal Medicine | Admitting: Internal Medicine

## 2017-11-01 DIAGNOSIS — R918 Other nonspecific abnormal finding of lung field: Secondary | ICD-10-CM | POA: Diagnosis not present

## 2017-11-03 ENCOUNTER — Telehealth (HOSPITAL_COMMUNITY): Payer: Self-pay | Admitting: *Deleted

## 2017-11-03 NOTE — Telephone Encounter (Signed)
Advanced Heart Failure Triage Encounter  Patient Name: [name]  Date of Call: 11/03/17  Problem:  Marylene Land, home health RN called to report patient has been feeling a little fatigued this week and slight shortness of breath with activity.  Patient's vitals are stable at 140/60, HR-53, and O2 sats 91-93% on 2-L (slightly lower than baseline).  Patient stated it may just be related to seasonal allergies.  Weight stable at 193.8 lbs.  No fever or chills.    Plan:  I told her it didn't sound like she was having any cardiac related symptoms but if she starts to feel worse or has fever/chills to call her PCP. No further questions.   Georgina Peer, RN

## 2017-11-06 NOTE — Progress Notes (Signed)
Patient ID: [name], female   DOB: [birth year], 77 y.o.   MRN: [id number]    Advanced Heart Failure Clinic Note   PCP: Dr Laurann Montana  Pulmonary: Dr Annamaria Boots HF Cardiology: Dr. Aundra Dubin  HPI: [name] is a 77 y.o. female with a history of chronic diastolic CHF, moderate pulmonary HTN, HTN, CAD s/p CABG, COPD on home oxygen, and dyslipidemia referred to the HF clinic by Dr Radford Pax.    Admitted 3/2 - 09/30/15 with A/C CHF. TEE was done to assess mitral stenosis, appeared mild to moderate, rheumatic valve.  TEE also showed aortic valve vegetation and concern arose for endocarditis with recent "tooth infection" per pt.  ID saw. 1 of 2 admission cultures with Gram + cocci in clusters thought to be contaminant.  2 further sets of BCx negative. RHC showed elevated right and left heart filling pressures.  Aortic valve was not crossed for full mitral stenosis assessment due to aortic valve vegetation. Diuresed 8 lbs. Discharge weight 175 lbs.   Due to increased dyspnea and exertional chest pain, she had RHC/LHC in 7/17.  Bypass grafts were patent but there was 90% stenosis in the distal LCx that could account for angina.  It was a small caliber vessel and not amenable to PCI. Filling pressures were near-normal on RHC.   Admitted 11/18 through 06/18/16 with chest pain and increased dyspnea. Exacerbation was thought to be from A fib RVR. She underwent successful DC/CV on 11/19. Diuresed with IV lasix and transitioned to  Discharged on amio 200 mg twice a day+ xarelto 15 mg daily. Discharge weight was 175 pounds.    Last echo in 12/17 was stable with EF 55-60% and mild to moderate mitral stenosis, mild MR, mild AS.    She presents today for regular follow up. She fell on 10/19/17 hitting her head and hip.  Since then, she has mostly been seated on a special pillow.  CT head and spine were unremarkable. She continues to have pain and is more SOB overall. She has chronic 2 pillow orthopnea. Weight is stable at home  and by our scales. She is taking all medications as directed. She feels depressed. She is not interested in being active. She feels sad or hopeless most days. She has been referred to palliative care through [facility].   ECG (personally reviewed): NSR, LAFB, RBBB   - ECHO 12/2014 EF 55-60%, peak PA pressure 68 mmHg, mild to moderate MS. - ECHO 2/17 EF 60-65%, mild LVH, mildly dilated RV with mildly decreased systolic function, PASP 29 mmHg (not sure we got a full envelope), moderate mitral stenosis with mean gradient 6 mmHg and MVA 1.37 cm^2, mild MR, mild AI, aortic sclerosis without significant stenosis.   - TEE 09/24/15 EF 60%, small 0.8 cm mobile mass on the LV side of the aortic valve. Mild AI and AS. 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 A small PFO with a weakly positive bubble study, Moderate TR - Echo (12/17): EF 55-60%, mild aortic stenosis, mild aortic insufficiency, rheumatic mitral valve with mild to moderate mitral stenosis (MVA 1.6 cm^2, mean gradient 8 mmHg), mild MR, moderate TR, PASP 68 mmHg.   PFTs (2/17): moderate-severe obstructive airways disease.   Sleep study 2017 was negative, no significant OSA.   [place] 09/24/15 (did not do LHC and cross valve to assess mitral stenosis due to aortic valve vegetation): Hemodynamics (mmHg) RA mean 16 RV 65/14 PA 71/28,  mean 45 PCWP mean 32 Cardiac Output (Fick) 4.85  Cardiac Index (Fick) 2.51 PVR 2.68 WU  LHC/RHC (7/17): 50% pLAD, patent LIMA-LAD, totally occluded PLOM, patent SVG-PLOM, AV LCx distal to PLOM with 90% stenosis (small caliber), totally occluded PLV, patent SVG-PLV.  RA mean 5 PA 43/13 PCWP mean 17 CI 2.7 PVR 1.9 WU Transient CHB when LV was entered, so unable to measure gradient across MV.   Labs 01/09/2015 K 4.3 Creatinine 0.91 BNP  Labs 02/19/2015 K 4.1 Creatinine 1.03 Labs 2/17 LDL 82 Labs 10/05/15 K 3.8, Creatinine 0.89  Labs 10/08/15 K 4.6, Creatinine  0.7 Labs 6/17 K 4.3, creatinine 0.95, HCT 32.7, BNP 221 Labs 7/17 BNP 286, K 4.2, creatinine 1.0, HCT 31.2 Labs 10/17 K 3.5, creatinine 1.18, LDL 78 Labs 1/18 K 4.2, creatinine 1.5 Labs 3/18: BNP 175, K 3.5, creatinine 1.54  Past Medical History:  Diagnosis Date  . Acute parotitis 01/05/2016  . Anxiety   . Bacteremia, escherichia coli   . Bursitis, shoulder    bilateral  . CHF (congestive heart failure) (HCC)    chronic diasotlic CHF  . Complication of anesthesia    "have trouble getting me awake sometimes; been ok latelhy" (09/24/2015)  . COPD (chronic obstructive pulmonary disease) ([place])   . Coronary heart disease 2001   s/p CABG  . Depression   . DJD (degenerative joint disease)   . GERD (gastroesophageal reflux disease)   . H/O hiatal hernia   . History of rheumatic heart disease    X 3-LAST TIME WHEN PATIENT WAS 77 YRS OLD  . Hyperlipidemia   . Hypertension   . OA (osteoarthritis)   . On home oxygen therapy    "2L; 24/7" (09/24/2015)  . PUD (peptic ulcer disease)   . Pulmonary HTN (HCC)    PASP 50-43mHg by [id number] . [place]   . Sleep apnea    intolerant to CPAP  . Thyroid nodule   . Urinary incontinence   . Varicose veins     Social History: The patient  reports that she quit smoking about 7 years ago. Her smoking use included Cigarettes. She has a 47 pack-year smoking history. She has never used smokeless tobacco. She reports that she does not drink alcohol or use illicit drugs.   Family History: The patient's family history includes Anemia in her mother; Emphysema in her father; Esophageal cancer in her daughter.   Review of systems complete and found to be negative unless listed in HPI.    Current Outpatient Medications  Medication Sig Dispense Refill  . acetaminophen (TYLENOL) 500 MG tablet Take 1,000 mg by mouth 2 (two) times daily as needed for headache.     . albuterol (PROVENTIL) (2.5 MG/3ML) 0.083% nebulizer solution Take 3 mLs (2.5 mg  total) by nebulization every 6 (six) hours as needed for wheezing or shortness of breath. 75 mL 12  . amiodarone (PACERONE) 200 MG tablet Take 1 tablet (200 mg total) by mouth daily. 90 tablet 3  . amLODipine (NORVASC) 2.5 MG tablet TAKE 1 TABLET BY MOUTH  DAILY 90 tablet 3  . benzonatate (TESSALON) 200 MG capsule Take 1 capsule (200 mg total) by mouth 3 (three) times daily as needed for cough. 90 capsule 0  . calcium-vitamin D (OSCAL WITH D) 500-200 MG-UNIT per tablet Take 2 tablets by mouth daily with breakfast.    . diclofenac sodium (VOLTAREN) 1 % GEL Apply 2 g topically 4 (four) times daily. 100 g 0  .  ipratropium (ATROVENT) 0.02 % nebulizer solution USE 1 VIAL VIA NEBULIZER  EVERY 6 HOURS AS NEEDED FOR WHEEZING OR SHORTNESS OF  BREATH 812.5 mL 0  . IRON PO Take 1 tablet by mouth daily.    . isosorbide mononitrate (IMDUR) 60 MG 24 hr tablet Take 1 tablet (60 mg total) daily by mouth. 90 tablet 3  . loratadine (CLARITIN) 10 MG tablet Take 10 mg by mouth at bedtime as needed for allergies.     . metoprolol tartrate (LOPRESSOR) 50 MG tablet Take 1 tablet (50 mg total) by mouth 2 (two) times daily. 14 tablet 0  . morphine (MSIR) 15 MG tablet Take 1 tablet (15 mg total) by mouth every 4 (four) hours as needed for severe pain. 5 tablet 0  . NITROSTAT 0.4 MG SL tablet place 1 tablet under the tongue if needed every 5 minutes for chest pain for 3 doses IF NO RELIEF AFTER 3RD DOSE CALL PRESCRIBER OR 911. 25 tablet 0  . omeprazole (PRILOSEC OTC) 20 MG tablet Take 20 mg by mouth at bedtime as needed (GERD).     . OXYGEN Inhale 2-3 L into the lungs continuous.     . potassium chloride SA (K-DUR,KLOR-CON) 20 MEQ tablet TAKE 2 TABLETS BY MOUTH TWO TIMES DAILY 360 tablet 6  . Rivaroxaban (XARELTO) 15 MG TABS tablet Take 1 tablet (15 mg total) by mouth daily with supper. 90 tablet 3  . sertraline (ZOLOFT) 100 MG tablet Take 100 mg by mouth at bedtime.     . simvastatin (ZOCOR) 20 MG tablet TAKE 3 TABLETS BY  MOUTH  DAILY AT 6 PM 270 tablet 3  . spironolactone (ALDACTONE) 25 MG tablet TAKE 1 TABLET BY MOUTH  DAILY 90 tablet 3  . torsemide (DEMADEX) 20 MG tablet Take 3 tablets (60 mg total) by mouth 2 (two) times daily. 540 tablet 3  . umeclidinium-vilanterol (ANORO ELLIPTA) 62.5-25 MCG/INH AEPB Inhale 1 puff into the lungs daily. 60 each 12  . umeclidinium-vilanterol (ANORO ELLIPTA) 62.5-25 MCG/INH AEPB Inhale 1 puff into the lungs daily. 1 each 0   No current facility-administered medications for this visit.     Vitals:   11/07/17 1410  BP: (!) 142/68  Pulse: (!) 53  SpO2: 92%  Weight: 196 lb (88.9 kg)     Wt Readings from Last 3 Encounters:  11/07/17 196 lb (88.9 kg)  10/19/17 194 lb (88 kg)  09/26/17 196 lb (88.9 kg)     PHYSICAL EXAM: General:Elderly and fatigued appearing. No resp difficulty. HEENT: Normal Neck: Supple. JVP 6-7 cm. Carotids 2+ bilat; no bruits. No thyromegaly or nodule noted. Cor: PMI nondisplaced. 2/6 SEM RUSB with clear S2. Lungs: CTAB, normal effort. Abdomen: Soft, non-tender, non-distended, no HSM. No bruits or masses. +BS  Extremities: No cyanosis, clubbing, or rash. Trace ankle edema at most.  Neuro: Alert & orientedx3, cranial nerves grossly intact. moves all 4 extremities w/o difficulty. Affect pleasant   ASSESSMENT & PLAN: 1. Chronic diastolic CHF/valvular heart disease: TEE in 3/17 showed normal EF, mild to moderate mitral stenosis.  Most recent [place] in 7/17 showed near-normal filling pressures.  Last echo in 11/17 showed normal EF, mild-moderate MS with mild MR, mild AS with mild AI.   - Repeat Echo 09/12/17 with EF 55-60%, Grade 3, Mild AI. - NYHA IIIb symptoms. Out of proportion to exam.  - Volume status stable on exam and ReDs vest 28% indicating euvolemia.  - Continue torsemide 60 mg BID  - Continue 25  mg spironolactone daily. - Reinforced fluid restriction to < 2 L daily, sodium restriction to less than 2000 mg daily, and the importance of  daily weights.   2. CAD S/P CABG:  She has potential source of angina from 90% stenosis in distal LCx.  The vessel is small at this point and not amenable to PCI.   - No s/s of ischemia.    - Continue statin. Lipids OK 09/05/17 - On Xarelto so no Aspirin. - Continue Imdur.  3. Atrial fibrillation: Paroxysmal. CHADSVASC score is 4 (age > 74, female, HTN, CHF).  S/P DC-CV 06/12/16.  - Regular on exam.  - Continue amio 200 mg once a day.  - Check TSH and LFTS. She understands she should get yearly eye exam.  - Continue Xarelto/metoprolol.  4. Pulmonary HTN: - This is primarily pulmonary venous hypertension based on most recent RHC. Focus on fluid status.  5. COPD: Followed by Dr Annamaria Boots. PFTs in 2/17 with moderate to severe obstruction.   - On O2 2 lpm via [place].   - No change to current plan.   6.  OSA: - Intolerant CPAP.  7.  Mitral stenosis: Rheumatic mitral stenosis.  Mild to moderate mitral stenosis by TEE in 3/17.  Unable to assess by cath in 3/17 because she had aortic valve vegetation and the valve was not crossed. Unable to assess by cath in 7/17 because she had transient complete heart block when the LV was entered. Treating medically for now.  Would be high risk for surgery with severe COPD.  - Mild to moderate on Echo 09/12/17. 8. Aortic valve disease:  - Mild AI and mild AS last echo. No change.   9. Asymmetric lower extremity edema:  - Resolved with diuresis. [place] negative for DVT.  38. DOE - Suspect multifactorial and a combination of COPD, deconditioning, and depression.  - Fluid status stable on exam and ReDs vest.  11. Depression - Education officer, museum met today for depression screening. - Discussed with MD. Will start on Celexa 10 mg daily and follow.  - Will send note to PCP.   Shirley Friar, PA-C 11/06/2017  Greater than 50% of the 30 minute visit was spent in counseling/coordination of care regarding disease state education, salt/fluid restriction, sliding scale  diuretics, and medication compliance.

## 2017-11-07 ENCOUNTER — Encounter (HOSPITAL_COMMUNITY): Payer: Self-pay

## 2017-11-07 ENCOUNTER — Ambulatory Visit (HOSPITAL_COMMUNITY)
Admission: RE | Admit: 2017-11-07 | Discharge: 2017-11-07 | Disposition: A | Discharge status: Home / Self Care | Payer: Medicare Other | Source: Ambulatory Visit | Attending: Cardiology | Admitting: Cardiology

## 2017-11-07 VITALS — BP 142/68 | HR 53 | Wt 196.0 lb

## 2017-11-07 DIAGNOSIS — J449 Chronic obstructive pulmonary disease, unspecified: Secondary | ICD-10-CM | POA: Diagnosis not present

## 2017-11-07 DIAGNOSIS — Z8711 Personal history of peptic ulcer disease: Secondary | ICD-10-CM | POA: Diagnosis not present

## 2017-11-07 DIAGNOSIS — I050 Rheumatic mitral stenosis: Secondary | ICD-10-CM | POA: Diagnosis not present

## 2017-11-07 DIAGNOSIS — I480 Paroxysmal atrial fibrillation: Secondary | ICD-10-CM | POA: Insufficient documentation

## 2017-11-07 DIAGNOSIS — Z9981 Dependence on supplemental oxygen: Secondary | ICD-10-CM | POA: Insufficient documentation

## 2017-11-07 DIAGNOSIS — E785 Hyperlipidemia, unspecified: Secondary | ICD-10-CM | POA: Insufficient documentation

## 2017-11-07 DIAGNOSIS — Z79899 Other long term (current) drug therapy: Secondary | ICD-10-CM | POA: Diagnosis not present

## 2017-11-07 DIAGNOSIS — K047 Periapical abscess without sinus: Secondary | ICD-10-CM | POA: Diagnosis not present

## 2017-11-07 DIAGNOSIS — I272 Pulmonary hypertension, unspecified: Secondary | ICD-10-CM

## 2017-11-07 DIAGNOSIS — Z951 Presence of aortocoronary bypass graft: Secondary | ICD-10-CM | POA: Diagnosis not present

## 2017-11-07 DIAGNOSIS — M1990 Unspecified osteoarthritis, unspecified site: Secondary | ICD-10-CM | POA: Diagnosis not present

## 2017-11-07 DIAGNOSIS — J9611 Chronic respiratory failure with hypoxia: Secondary | ICD-10-CM

## 2017-11-07 DIAGNOSIS — Z87891 Personal history of nicotine dependence: Secondary | ICD-10-CM | POA: Diagnosis not present

## 2017-11-07 DIAGNOSIS — I5032 Chronic diastolic (congestive) heart failure: Secondary | ICD-10-CM | POA: Diagnosis present

## 2017-11-07 DIAGNOSIS — I4510 Unspecified right bundle-branch block: Secondary | ICD-10-CM | POA: Insufficient documentation

## 2017-11-07 DIAGNOSIS — I482 Chronic atrial fibrillation, unspecified: Secondary | ICD-10-CM

## 2017-11-07 DIAGNOSIS — F329 Major depressive disorder, single episode, unspecified: Secondary | ICD-10-CM | POA: Insufficient documentation

## 2017-11-07 DIAGNOSIS — K219 Gastro-esophageal reflux disease without esophagitis: Secondary | ICD-10-CM | POA: Diagnosis not present

## 2017-11-07 DIAGNOSIS — R001 Bradycardia, unspecified: Secondary | ICD-10-CM | POA: Diagnosis not present

## 2017-11-07 DIAGNOSIS — I2510 Atherosclerotic heart disease of native coronary artery without angina pectoris: Secondary | ICD-10-CM | POA: Insufficient documentation

## 2017-11-07 DIAGNOSIS — Z7901 Long term (current) use of anticoagulants: Secondary | ICD-10-CM | POA: Insufficient documentation

## 2017-11-07 DIAGNOSIS — I10 Essential (primary) hypertension: Secondary | ICD-10-CM

## 2017-11-07 DIAGNOSIS — I110 Hypertensive heart disease with heart failure: Secondary | ICD-10-CM | POA: Insufficient documentation

## 2017-11-07 DIAGNOSIS — F419 Anxiety disorder, unspecified: Secondary | ICD-10-CM | POA: Insufficient documentation

## 2017-11-07 DIAGNOSIS — I519 Heart disease, unspecified: Secondary | ICD-10-CM | POA: Diagnosis not present

## 2017-11-07 LAB — BASIC METABOLIC PANEL
ANION GAP: 9 (ref 5–15)
BUN: 20 mg/dL (ref 6–20)
CALCIUM: 9.1 mg/dL (ref 8.9–10.3)
CO2: 39 mmol/L — AB (ref 22–32)
CREATININE: 1.38 mg/dL — AB (ref 0.44–1.00)
Chloride: 93 mmol/L — ABNORMAL LOW (ref 101–111)
GFR calc Af Amer: 41 mL/min — ABNORMAL LOW (ref >60)
GFR, EST NON AFRICAN AMERICAN: 36 mL/min — AB (ref >60)
GLUCOSE: 109 mg/dL — AB (ref 65–99)
Potassium: 4.7 mmol/L (ref 3.5–5.1)
Sodium: 141 mmol/L (ref 135–145)

## 2017-11-07 MED ORDER — CITALOPRAM HYDROBROMIDE 10 MG PO TABS: 10.0000 mg | ORAL_TABLET | Freq: Every day | ORAL | 3 refills | Status: DC

## 2017-11-07 MED ORDER — CITALOPRAM HYDROBROMIDE 10 MG PO TABS: 10.0000 mg | ORAL_TABLET | Freq: Every day | ORAL | 0 refills | Status: DC

## 2017-11-07 NOTE — Patient Instructions (Signed)
Labs today (will call for abnormal results, otherwise no news is good news)  START taking Celexa 10 mg Once Daily  Follow up with Dr. Shirlee Latch in 6-8 weeks.

## 2017-11-07 NOTE — Addendum Note (Signed)
Encounter addended by: Marcy Siren, LCSW on: 11/07/2017 5:29 PM  Actions taken: Sign clinical note

## 2017-11-07 NOTE — Progress Notes (Signed)
Heart Failure Clinic Social Work Assessment  [name]  presents today in association with an Advanced Heart Failure Clinic Appointment.  Patient seen today by CSW for follow up/assistance on Loss of independence and/or  depressive symptoms.   Social Determinants impacting successful heart failure regimen:  Housing: patient lives with her husband. Food: Do you have enough food? Yes  Do you know and understand healthy eating and how that affects your heart failure diagnosis? Yes  Do you follow a low salt diet?  Yes  Utilities: Do you have gas and/or electricity on in your home? 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 family  Patient shared recent decline in health due to HF and a fall which she states has exacerbated underlining depression from her daughters death in 35. Patient states her daughter [name] passed away from cancer and "I have been told that I need to get over it and move on". Patient became tearful and states sometimes she thinks she would be better off dead but denies any suicidal ideation. CSW administered the PHQ-9 and patient scored a 21 but adamant that she would not act on her depressive thoughts. She stated that "if I could do more than I think I would feel better". She noted that she has been unable to cook and misses doing things around the house.   Patient appears to be depressed and overwhelmed with her current health issues and ongoing grief issues.     CSW assisted patient with Screening Tool(s)  Administered, Supportive Counseling and Referral to Grief Counseling with the goal to Increase healthy adjustment to current life circumstances and Begin healthy grieving over loss of independence and death of daughter.   Heart Failure Clinic Social Worker will continue to coordinate and monitor patient's treatment plan as needed.  CSW continues to  be available for identified needs. Lasandra Beech, LCSW, CCSW-MCS [phone]

## 2017-11-08 ENCOUNTER — Telehealth (HOSPITAL_COMMUNITY): Payer: Self-pay | Admitting: *Deleted

## 2017-11-08 NOTE — Telephone Encounter (Signed)
Advanced Heart Failure Triage Encounter  Patient Name: [name]  Date of Call: 11/08/17  Problem:  Home health RN called to report that while Nurse tech was visiting patient her heart rate was 54 and temp was 95.7.  Patient didn't complain of any chills.  O2 sats were 94%.  She did have a nose bleed earlier this morning but that had stopped.   Plan:  I advised RN that patient was seen yesterday in clinic and HR was 53, so that is stable.  I did ask her to recheck patient's temp and make sure she hadn't had anything to eat or drink at least 30 minutes prior.  I asked her to call me back if it was in fact that low.  RN agrees and will call back if still abnormal. No further questions.   Georgina Peer, RN

## 2017-11-20 ENCOUNTER — Telehealth: Payer: Self-pay | Admitting: Internal Medicine

## 2017-11-20 NOTE — Telephone Encounter (Signed)
Called and spoke with [facility]. She states that the patient was placed on albuterol inhaler when discharged from the hospital. She recently was prescribed the albuterol nebulizer for increased shortness of breath. She is wanting to make sure that it is ok for the patient to be on both. Lawanna Kobus is also needing a verbal for 3 weeks extension for home health care skilled nursing.   CY please advise on this, thanks.   Current Outpatient Medications on File Prior to Visit  Medication Sig Dispense Refill  . acetaminophen (TYLENOL) 500 MG tablet Take 1,000 mg by mouth 2 (two) times daily as needed for headache.     . albuterol (PROVENTIL) (2.5 MG/3ML) 0.083% nebulizer solution Take 3 mLs (2.5 mg total) by nebulization every 6 (six) hours as needed for wheezing or shortness of breath. 75 mL 12  . amiodarone (PACERONE) 200 MG tablet Take 1 tablet (200 mg total) by mouth daily. 90 tablet 3  . amLODipine (NORVASC) 2.5 MG tablet TAKE 1 TABLET BY MOUTH  DAILY 90 tablet 3  . benzonatate (TESSALON) 200 MG capsule Take 1 capsule (200 mg total) by mouth 3 (three) times daily as needed for cough. 90 capsule 0  . calcium-vitamin D (OSCAL WITH D) 500-200 MG-UNIT per tablet Take 2 tablets by mouth daily with breakfast.    . citalopram (CELEXA) 10 MG tablet Take 1 tablet (10 mg total) by mouth daily. 30 tablet 0  . [START ON 12/07/2017] citalopram (CELEXA) 10 MG tablet Take 1 tablet (10 mg total) by mouth daily. 90 tablet 3  . diclofenac sodium (VOLTAREN) 1 % GEL Apply 2 g topically 4 (four) times daily. 100 g 0  . ipratropium (ATROVENT) 0.02 % nebulizer solution USE 1 VIAL VIA NEBULIZER  EVERY 6 HOURS AS NEEDED FOR WHEEZING OR SHORTNESS OF  BREATH 812.5 mL 0  . IRON PO Take 1 tablet by mouth daily.    . isosorbide mononitrate (IMDUR) 60 MG 24 hr tablet Take 1 tablet (60 mg total) daily by mouth. 90 tablet 3  . loratadine (CLARITIN) 10 MG tablet Take 10 mg by mouth at bedtime as needed for allergies.     . metoprolol  tartrate (LOPRESSOR) 50 MG tablet Take 1 tablet (50 mg total) by mouth 2 (two) times daily. 14 tablet 0  . morphine (MSIR) 15 MG tablet Take 1 tablet (15 mg total) by mouth every 4 (four) hours as needed for severe pain. 5 tablet 0  . NITROSTAT 0.4 MG SL tablet place 1 tablet under the tongue if needed every 5 minutes for chest pain for 3 doses IF NO RELIEF AFTER 3RD DOSE CALL PRESCRIBER OR 911. 25 tablet 0  . omeprazole (PRILOSEC OTC) 20 MG tablet Take 20 mg by mouth at bedtime as needed (GERD).     . OXYGEN Inhale 2-3 L into the lungs continuous.     . potassium chloride SA (K-DUR,KLOR-CON) 20 MEQ tablet TAKE 2 TABLETS BY MOUTH TWO TIMES DAILY 360 tablet 6  . Rivaroxaban (XARELTO) 15 MG TABS tablet Take 1 tablet (15 mg total) by mouth daily with supper. 90 tablet 3  . sertraline (ZOLOFT) 100 MG tablet Take 100 mg by mouth at bedtime.     . simvastatin (ZOCOR) 20 MG tablet TAKE 3 TABLETS BY MOUTH  DAILY AT 6 PM 270 tablet 3  . spironolactone (ALDACTONE) 25 MG tablet TAKE 1 TABLET BY MOUTH  DAILY 90 tablet 3  . torsemide (DEMADEX) 20 MG tablet Take 3 tablets (60 mg  total) by mouth 2 (two) times daily. 540 tablet 3  . umeclidinium-vilanterol (ANORO ELLIPTA) 62.5-25 MCG/INH AEPB Inhale 1 puff into the lungs daily. 60 each 12  . umeclidinium-vilanterol (ANORO ELLIPTA) 62.5-25 MCG/INH AEPB Inhale 1 puff into the lungs daily. 1 each 0   No current facility-administered medications on file prior to visit.     Allergies  Allergen Reactions  . Lisinopril Cough    Developed persistent/fry cough for a month.   . Prednisone Rash    jittery  . Tape Other (See Comments)    Bleeding  . Xopenex [Levalbuterol] Other (See Comments)    Made mouth and throat sore  . Cefpodoxime Other (See Comments)    Yeast infections   . Codeine Other (See Comments)    Anxious/ jittery  . Lipitor [Atorvastatin] Other (See Comments)    Made joint start hurting/ Muscle aches  . Other Swelling    Venholin HFA  .  Penicillins Other (See Comments)    Has patient had a PCN reaction causing immediate rash, facial/tongue/throat swelling, SOB or lightheadedness with hypotension: unknown, childhood reaction Has patient had a PCN reaction causing severe rash involving mucus membranes or skin necrosis: No Has patient had a PCN reaction that required hospitalization No Has patient had a PCN reaction occurring within the last 10 years: No If all of the above answers are "NO", then may proceed with Cephalosporin use.   Terald Sleeper [Kdc:Albuterol] Other (See Comments)    "jittery"  . Anoro Ellipta [Umeclidinium-Vilanterol]     itchy  . Clindamycin Other (See Comments)    Unknown   . Hydrocodone-Acetaminophen Anxiety  . Latex Itching and Rash  . Levalbuterol Hcl Other (See Comments)    Unknown

## 2017-11-20 NOTE — Telephone Encounter (Signed)
[name] is returning call. CB number is [phone]. Ok to leave detailed message on VM.

## 2017-11-20 NOTE — Telephone Encounter (Signed)
Per CY ok for patient to use either neb solution or inhaler at any given time just not on top of each other.   Verbal order ok to give.

## 2017-11-20 NOTE — Telephone Encounter (Signed)
[facility], unable to reach left message to give [place] a call back.

## 2017-11-21 ENCOUNTER — Ambulatory Visit: Payer: Medicare Other | Admitting: Neurology

## 2017-11-21 ENCOUNTER — Encounter: Payer: Self-pay | Admitting: Neurology

## 2017-11-21 ENCOUNTER — Other Ambulatory Visit: Payer: Self-pay | Admitting: Internal Medicine

## 2017-11-21 ENCOUNTER — Other Ambulatory Visit: Payer: Self-pay | Admitting: Licensed Clinical Social Worker

## 2017-11-21 VITALS — BP 139/58 | HR 59 | Ht 66.0 in | Wt 197.5 lb

## 2017-11-21 DIAGNOSIS — Z515 Encounter for palliative care: Secondary | ICD-10-CM

## 2017-11-21 DIAGNOSIS — F320 Major depressive disorder, single episode, mild: Secondary | ICD-10-CM

## 2017-11-21 DIAGNOSIS — J301 Allergic rhinitis due to pollen: Secondary | ICD-10-CM

## 2017-11-21 DIAGNOSIS — R0609 Other forms of dyspnea: Principal | ICD-10-CM

## 2017-11-21 DIAGNOSIS — R269 Unspecified abnormalities of gait and mobility: Secondary | ICD-10-CM | POA: Diagnosis not present

## 2017-11-21 DIAGNOSIS — G251 Drug-induced tremor: Secondary | ICD-10-CM | POA: Diagnosis not present

## 2017-11-21 NOTE — Progress Notes (Signed)
Subjective:    Patient ID: [name] is a 77 y.o. female.  HPI    Huston Foley, MD, PhD [facility] [address] [address] [place], [place] [address]  Dear Dr. Valentina Lucks,   I saw your patient, [name], upon your kind request, for initial consultation of her tremors. The patient is accompanied by her son today. As you know, Mr. Sneath is a 10 year old woman with an underlying complex medical history of chronic diastolic CHF, COPD, coronary artery disease with status post CABG, depression, anxiety, degenerative joint disease, low back pain, hiatal hernia, hypertension, hyperlipidemia, osteoarthritis, pulmonary hypertension, sleep apnea, on home oxygen treatment, and obesity, who reports bilateral upper extremity tremors with recent onset. I reviewed your office records. She reports a recent fall about a month ago. She fell backwards onto her back and hit her head. She has seen orthopedics for her spine disease. She presented to the emergency room on 10/19/2017 with a complaint of headache and low back pain. I reviewed the emergency room records. She had a head CT without contrast on 10/19/2017 and I reviewed the results: No CT evidence for acute intracranial abnormality. Atrophy and minimal small vessel ischemic changes of the white matter. She had a CT lumbar spine without contrast on 10/19/2017 and I reviewed the results: No acute/traumatic lumbar spine pathology. Stable old T12 compression fracture. Status post posterior fusion at L4 or L5. She also had a CT renal stone study which showed a nonobstructive left renal interpolar stone. No hydronephrosis. Sigmoid diverticulosis. No bowel obstruction or active inflammation. Normal appendix. Aortic atherosclerosis. Of note, she was on multiple medications including amiodarone, Norvasc, Imdur, Lopressor, morphine as needed, omeprazole, Xarelto, Celexa, Lyrica, Zocor generic, spironolactone, torsemide. She reports that  when she came off of the Lyrica and the Celexa her tremors improved. She is no longer on Zoloft, used it in the past. She does not take morphine, was given a short prescription from the ER. She has had home health physical therapy. She does not walk very much, uses a walker. She lives with her husband and her son.     Her Past Medical History Is Significant For: Past Medical History:  Diagnosis Date  . Acute parotitis 01/05/2016  . Anxiety   . Bacteremia, escherichia coli   . Bursitis, shoulder    bilateral  . CHF (congestive heart failure) (HCC)    chronic diasotlic CHF  . Complication of anesthesia    "have trouble getting me awake sometimes; been ok latelhy" (09/24/2015)  . COPD (chronic obstructive pulmonary disease) (HCC)   . Coronary heart disease 2001   s/p CABG  . Depression   . DJD (degenerative joint disease)   . GERD (gastroesophageal reflux disease)   . H/O hiatal hernia   . History of rheumatic heart disease    X 3-LAST TIME WHEN PATIENT WAS 77 YRS OLD  . Hyperlipidemia   . Hypertension   . OA (osteoarthritis)   . On home oxygen therapy    "2L; 24/7" (09/24/2015)  . PUD (peptic ulcer disease)   . Pulmonary HTN (HCC)    PASP 50-13mmHg by [id number]  . Pulmonary HTN (HCC)   . Sleep apnea    intolerant to CPAP  . Thyroid nodule   . Urinary incontinence   . Varicose veins     Her Past Surgical History Is Significant For: Past Surgical History:  Procedure Laterality Date  . BILATERAL SALPINGOOPHORECTOMY    . CARDIAC CATHETERIZATION  02/11/2008, 01/30/2004, 06/14/2000  . CARDIAC CATHETERIZATION N/A 09/24/2015   Procedure: Right Heart Cath;  Surgeon: Laurey Morale, MD;  Location: [facility] INVASIVE CV LAB;  Service: Cardiovascular;  Laterality: N/A;  . CARDIAC CATHETERIZATION N/A 02/08/2016   Procedure: Right/Left Heart Cath and Coronary/Graft Angiography;  Surgeon: Laurey Morale, MD;  Location: [facility] INVASIVE CV LAB;  Service: Cardiovascular;  Laterality: N/A;  . CARDIOVERSION  N/A 06/12/2016   Procedure: CARDIOVERSION;  Surgeon: Duke Salvia, MD;  Location: [facility] OR;  Service: Cardiovascular;  Laterality: N/A;  . CORONARY ARTERY BYPASS GRAFT  06/21/2000   x4  . INCONTINENCE SURGERY     "tacked"  . TEE WITHOUT CARDIOVERSION N/A 09/24/2015   Procedure: TRANSESOPHAGEAL ECHOCARDIOGRAM (TEE);  Surgeon: Laurey Morale, MD;  Location: [facility] ENDOSCOPY;  Service: Cardiovascular;  Laterality: N/A;  . THYROIDECTOMY, PARTIAL  2010   Dr Michaell Cowing  . TOTAL ABDOMINAL HYSTERECTOMY  1990   1990  . TUBAL LIGATION    . VAGINAL DELIVERY  x4    Her Family History Is Significant For: Family History  Problem Relation Age of Onset  . Anemia Mother   . Emphysema Father   . Esophageal cancer Daughter     Her Social History Is Significant For: Social History   Socioeconomic History  . Marital status: Married    Spouse name: Not on file  . Number of children: Not on file  . Years of education: Not on file  . Highest education level: Not on file  Occupational History  . Not on file  Social Needs  . Financial resource strain: Not on file  . Food insecurity:    Worry: Not on file    Inability: Not on file  . Transportation needs:    Medical: Not on file    Non-medical: Not on file  Tobacco Use  . Smoking status: Former Smoker    Packs/day: 1.00    Years: 47.00    Pack years: 47.00    Types: Cigarettes    Last attempt to quit: 07/26/2007    Years since quitting: 10.3  . Smokeless tobacco: Never Used  Substance and Sexual Activity  . Alcohol use: No  . Drug use: No  . Sexual activity: Never  Lifestyle  . Physical activity:    Days per week: Not on file    Minutes per session: Not on file  . Stress: Not on file  Relationships  . Social connections:    Talks on phone: Not on file    Gets together: Not on file    Attends religious service: Not on file    Active member of club or organization: Not on file    Attends meetings of clubs or organizations: Not on file     Relationship status: Not on file  Other Topics Concern  . Not on file  Social History Narrative   Husband bilateral left amputee for atheorsclerotic disease    Her Allergies Are:  Allergies  Allergen Reactions  . Lisinopril Cough    Developed persistent/fry cough for a month.   . Prednisone Rash    jittery  . Tape Other (See Comments)    Bleeding  . Xopenex [Levalbuterol] Other (See Comments)    Made mouth and throat sore  . Cefpodoxime Other (See Comments)    Yeast infections   . Codeine Other (See Comments)    Anxious/ jittery  . Lipitor [Atorvastatin] Other (See Comments)    Made joint start hurting/ Muscle aches  .  Other Swelling    Venholin HFA  . Penicillins Other (See Comments)    Has patient had a PCN reaction causing immediate rash, facial/tongue/throat swelling, SOB or lightheadedness with hypotension: unknown, childhood reaction Has patient had a PCN reaction causing severe rash involving mucus membranes or skin necrosis: No Has patient had a PCN reaction that required hospitalization No Has patient had a PCN reaction occurring within the last 10 years: No If all of the above answers are "NO", then may proceed with Cephalosporin use.   Terald Sleeper [Kdc:Albuterol] Other (See Comments)    "jittery"  . Anoro Ellipta [Umeclidinium-Vilanterol]     itchy  . Clindamycin Other (See Comments)    Unknown   . Hydrocodone-Acetaminophen Anxiety  . Latex Itching and Rash  . Levalbuterol Hcl Other (See Comments)    Unknown   :   Her Current Medications Are:  Outpatient Encounter Medications as of 11/21/2017  Medication Sig  . acetaminophen (TYLENOL) 500 MG tablet Take 1,000 mg by mouth 2 (two) times daily as needed for headache.   . albuterol (PROVENTIL) (2.5 MG/3ML) 0.083% nebulizer solution Take 3 mLs (2.5 mg total) by nebulization every 6 (six) hours as needed for wheezing or shortness of breath.  Marland Kitchen amiodarone (PACERONE) 200 MG tablet Take 1 tablet (200 mg total) by  mouth daily.  Marland Kitchen amLODipine (NORVASC) 2.5 MG tablet TAKE 1 TABLET BY MOUTH  DAILY  . benzonatate (TESSALON) 200 MG capsule Take 1 capsule (200 mg total) by mouth 3 (three) times daily as needed for cough.  . calcium-vitamin D (OSCAL WITH D) 500-200 MG-UNIT per tablet Take 2 tablets by mouth daily with breakfast.  . diclofenac sodium (VOLTAREN) 1 % GEL Apply 2 g topically 4 (four) times daily.  Marland Kitchen ipratropium (ATROVENT) 0.02 % nebulizer solution USE 1 VIAL VIA NEBULIZER  EVERY 6 HOURS AS NEEDED FOR WHEEZING OR SHORTNESS OF  BREATH  . IRON PO Take 1 tablet by mouth daily.  . isosorbide mononitrate (IMDUR) 60 MG 24 hr tablet Take 1 tablet (60 mg total) daily by mouth.  . loratadine (CLARITIN) 10 MG tablet Take 10 mg by mouth at bedtime as needed for allergies.   . metoprolol tartrate (LOPRESSOR) 50 MG tablet Take 1 tablet (50 mg total) by mouth 2 (two) times daily.  Marland Kitchen NITROSTAT 0.4 MG SL tablet place 1 tablet under the tongue if needed every 5 minutes for chest pain for 3 doses IF NO RELIEF AFTER 3RD DOSE CALL PRESCRIBER OR 911.  . omeprazole (PRILOSEC OTC) 20 MG tablet Take 20 mg by mouth at bedtime as needed (GERD).   . OXYGEN Inhale 2-3 L into the lungs continuous.   . potassium chloride SA (K-DUR,KLOR-CON) 20 MEQ tablet TAKE 2 TABLETS BY MOUTH TWO TIMES DAILY  . Rivaroxaban (XARELTO) 15 MG TABS tablet Take 1 tablet (15 mg total) by mouth daily with supper.  . simvastatin (ZOCOR) 20 MG tablet TAKE 3 TABLETS BY MOUTH  DAILY AT 6 PM  . spironolactone (ALDACTONE) 25 MG tablet TAKE 1 TABLET BY MOUTH  DAILY  . torsemide (DEMADEX) 20 MG tablet Take 3 tablets (60 mg total) by mouth 2 (two) times daily.  . [DISCONTINUED] citalopram (CELEXA) 10 MG tablet Take 1 tablet (10 mg total) by mouth daily.  . [DISCONTINUED] citalopram (CELEXA) 10 MG tablet Take 1 tablet (10 mg total) by mouth daily.  . [DISCONTINUED] morphine (MSIR) 15 MG tablet Take 1 tablet (15 mg total) by mouth every 4 (four) hours as needed  for  severe pain.  . [DISCONTINUED] sertraline (ZOLOFT) 100 MG tablet Take 100 mg by mouth at bedtime.   . [DISCONTINUED] umeclidinium-vilanterol (ANORO ELLIPTA) 62.5-25 MCG/INH AEPB Inhale 1 puff into the lungs daily.  . [DISCONTINUED] umeclidinium-vilanterol (ANORO ELLIPTA) 62.5-25 MCG/INH AEPB Inhale 1 puff into the lungs daily.   No facility-administered encounter medications on file as of 11/21/2017.   :   Review of Systems:  Out of a complete 14 point review of systems, all are reviewed and negative with the exception of these symptoms as listed below: Review of Systems  Neurological:       Patient fell and hit her head very hard at the end of March. She was seen in ED and told she had a concussion. She was put on Lyrica and Celexa, this caused her to shake. She stopped the medication and the tremors stopped. She is still having severe headaches and neck and shoulder pain.     Objective:  Neurological Exam  Physical Exam Physical Examination:   Vitals:   11/21/17 1339  BP: (!) 139/58  Pulse: (!) 59    General Examination: The patient is a very pleasant 77 y.o. female in no acute distress. She appears well-developed and well-nourished and well groomed. She is sitting in a wheelchair, she has oxygen via nasal cannula.  HEENT: Normocephalic, atraumatic, pupils are equal, round and reactive to light and accommodation. She wears corrective eyeglasses. Extraocular tracking is good, hearing is grossly intact. Tympanic membrane is clear on the left and impacted with cerumen on the right. She does report some muffled hearing on the right sometimes. Face is symmetric with normal facial animation and normal facial sensation. Speech is clear with no dysarthria noted. There is no hypophonia. There is no lip, neck/head, jaw or voice tremor. Neck is supple with full range of passive and active motion. There are no carotid bruits on auscultation. Oropharynx exam reveals: mild mouth dryness, adequate  dental hygiene. Tongue protrudes centrally and palate elevates symmetrically.   Chest: Clear to auscultation with distant breath sounds, no wheezing or crackles.  Heart: S1+S2+0, regular and normal without murmurs, rubs or gallops noted.   Abdomen: Soft, non-tender and non-distended with normal bowel sounds appreciated on auscultation.  Extremities: There is 1+ pitting edema in the distal lower extremities bilaterally. Pedal pulses are intact.  Skin: Warm and dry without trophic changes noted.  Musculoskeletal: exam reveals no obvious joint deformities, tenderness or joint swelling or erythema.   Neurologically:  Mental status: The patient is awake, alert and oriented in all 4 spheres. Her immediate and remote memory, attention, language skills and fund of knowledge are appropriate. There is no evidence of aphasia, agnosia, apraxia or anomia. Speech is clear with normal prosody and enunciation. Thought process is linear. Mood is normal and affect is normal.  Cranial nerves II - XII are as described above under HEENT exam. In addition: shoulder shrug is normal with equal shoulder height noted. Motor exam: thin bulk, global strength of 4+ out of 5, generally somewhat weaker and hip flexion bilaterally. Normal tone, no rest tremor, no postural or action tremor. Romberg is not testable safely. Reflexes are 1+ throughout. Fine motor skills and coordination: grossly intact with hand movements and foot agility. Cerebellar testing: No dysmetria or intention tremor.  Sensory exam: intact to light touch in the upper and lower extremities.  Gait, station and balance: She did not bring her walker and is not comfortable standing or attempting to walk for me.  Assessment and Plan:  In summary, KATELEN LUEPKE is a very pleasant 77 y.o.-year old female with an underlying complex medical history of chronic diastolic CHF, COPD, coronary artery disease with status post CABG, depression, anxiety, degenerative  joint disease, low back pain, hiatal hernia, hypertension, hyperlipidemia, osteoarthritis, pulmonary hypertension, sleep apnea, on home oxygen treatment, and obesity, who Presents for neurologic consultation of her tremors. She reports improvement after she discontinued Lyrica Celexa. Certainly, she does not have any evidence of parkinsonism on exam and no evidence of any significant tremor. She is reassured in that regard. She reports difficulty with balance and gait, she has been in physical therapy at home for this. I would recommend continuation of this. Likely, there is a component of deconditioning. She is advised to stay well-hydrated and use her walker at all times and follow instructions by physical therapy. She had a head CT without contrast when she was in the emergency room in March. She does not have any focal neurologic exam. She does have occasional headaches which she has been taking Tylenol. She was advised not to take anything else for her headache at this point, I can order an MRI brain without contrast for completion. We would not give contrast because of impaired kidney function recently. She is not keen on pursuing a brain MRI at this time which I understand. There is no pressing reason for this from my end of things. She can think about it. I suggested as needed follow-up with me. I answered all their questions today and the patient and her son were in agreement. Thank you very much for allowing me to participate in the care of this nice patient. If I can be of any further assistance to you please do not hesitate to call me at [phone].  Sincerely,   Huston Foley, MD, PhD

## 2017-11-21 NOTE — Patient Instructions (Addendum)
I do not see any signs of parkinsonism or significant tremor.  For headaches, you can use tylenol as needed.  You have had a CT head in March. I can order an brain MRI for completion. There is no sign of one sided weakness.  You have weaker leg muscles, likely in part due to deconditioning. I would recommend home health physical therapy.  Please stay well hydrated, follow instructions by your cardiologist too.  I will see you back as needed.

## 2017-11-21 NOTE — Progress Notes (Signed)
PALLIATIVE CARE CONSULT VISIT   PATIENT NAME: [name] DOB: [birth year] MRN: [id number]  PRIMARY CARE PROVIDER: Maurice Small, MD  REFERRING PROVIDER: Maurice Small, MD [address] [place], [place] [address]  RESPONSIBLE PARTY:  Self [phone] and son    RECOMMENDATIONS and PLAN:   1.Dyspnea on exertion R06.09:  Chronic.  Itching with use of Albuterol(inhaler and Ned.) Consider alternate respiratory meds(Symbicort, Ipatromium bromide) 2. Seasonal allergic rhinitis due to pollen J30.1:  Increase since running out of [place].  Avoid exposure to allergens to prevent respiratory exacerbations.  Restart [place] daily. 3. Depression, mild:  Related to death of daughter.  Postponed appt. With Child psychotherapist but plans on rescheduling same.  No thoughts of self harm.  Would still consider grief counseling.  Monitor 4. Gait disturbance R26.9:  No additional falls but remains unsteady.  Pending appt. With neurologist today.  Continue fall risk prevention 5. Advanced Care Planning: Chest compressions without intubation previously selected by patient.  MOST form is up to date with limited interventions, antibiotics and IV fluids if indicated.  No tube feeding.  Both documents are complete and in the home. Palliative care will continue to follow.  I spent 20 minutes providing this consultation,  from 10:15AM to 10:40AM at the home.   More than 50% of the time in this consultation was spent coordinating communication with pt. And her son.  HISTORY OF PRESENT ILLNESS:  Routine followup with [name] who reports that she continues to have chronic shortness of breath upon exertion and fatigue.  Denies any acute illnesses or additional falls since last visit.  She has discontinued use of Sertraline and Lyrica due to "shaking".  Tremors have ceased since d/c of these meds.  Mood has not worsened since d/c of Sertraline.   CODE STATUS:Compressions but no intubation/mechanical  ventilation.  PPS: 40% HOSPICE ELIGIBILITY/DIAGNOSIS: TBD  PAST MEDICAL HISTORY:  Past Medical History:  Diagnosis Date  . Acute parotitis 01/05/2016  . Anxiety   . Bacteremia, escherichia coli   . Bursitis, shoulder    bilateral  . CHF (congestive heart failure) (HCC)    chronic diasotlic CHF  . Complication of anesthesia    "have trouble getting me awake sometimes; been ok latelhy" (09/24/2015)  . COPD (chronic obstructive pulmonary disease) (HCC)   . Coronary heart disease 2001   s/p CABG  . Depression   . DJD (degenerative joint disease)   . GERD (gastroesophageal reflux disease)   . H/O hiatal hernia   . History of rheumatic heart disease    X 3-LAST TIME WHEN PATIENT WAS 77 YRS OLD  . Hyperlipidemia   . Hypertension   . OA (osteoarthritis)   . On home oxygen therapy    "2L; 24/7" (09/24/2015)  . PUD (peptic ulcer disease)   . Pulmonary HTN (HCC)    PASP 50-80mmHg by [id number]  . Pulmonary HTN (HCC)   . Sleep apnea    intolerant to CPAP  . Thyroid nodule   . Urinary incontinence   . Varicose veins     SOCIAL HX:  Social History   Tobacco Use  . Smoking status: Former Smoker    Packs/day: 1.00    Years: 47.00    Pack years: 47.00    Types: Cigarettes    Last attempt to quit: 07/26/2007    Years since quitting: 10.3  . Smokeless tobacco: Never Used  Substance Use Topics  . Alcohol use: No    ALLERGIES:  Allergies  Allergen Reactions  . Lisinopril Cough    Developed persistent/fry cough for a month.   . Prednisone Rash    jittery  . Tape Other (See Comments)    Bleeding  . Xopenex [Levalbuterol] Other (See Comments)    Made mouth and throat sore  . Cefpodoxime Other (See Comments)    Yeast infections   . Codeine Other (See Comments)    Anxious/ jittery  . Lipitor [Atorvastatin] Other (See Comments)    Made joint start hurting/ Muscle aches  . Other Swelling    Venholin HFA  . Penicillins Other (See Comments)    Has patient had a PCN reaction  causing immediate rash, facial/tongue/throat swelling, SOB or lightheadedness with hypotension: unknown, childhood reaction Has patient had a PCN reaction causing severe rash involving mucus membranes or skin necrosis: No Has patient had a PCN reaction that required hospitalization No Has patient had a PCN reaction occurring within the last 10 years: No If all of the above answers are "NO", then may proceed with Cephalosporin use.   Terald Sleeper [Kdc:Albuterol] Other (See Comments)    "jittery"  . Anoro Ellipta [Umeclidinium-Vilanterol]     itchy  . Clindamycin Other (See Comments)    Unknown   . Hydrocodone-Acetaminophen Anxiety  . Latex Itching and Rash  . Levalbuterol Hcl Other (See Comments)    Unknown      PERTINENT MEDICATIONS:  Outpatient Encounter Medications as of 11/21/2017  Medication Sig  . acetaminophen (TYLENOL) 500 MG tablet Take 1,000 mg by mouth 2 (two) times daily as needed for headache.   . albuterol (PROVENTIL) (2.5 MG/3ML) 0.083% nebulizer solution Take 3 mLs (2.5 mg total) by nebulization every 6 (six) hours as needed for wheezing or shortness of breath.  Marland Kitchen amiodarone (PACERONE) 200 MG tablet Take 1 tablet (200 mg total) by mouth daily.  Marland Kitchen amLODipine (NORVASC) 2.5 MG tablet TAKE 1 TABLET BY MOUTH  DAILY  . benzonatate (TESSALON) 200 MG capsule Take 1 capsule (200 mg total) by mouth 3 (three) times daily as needed for cough.  . calcium-vitamin D (OSCAL WITH D) 500-200 MG-UNIT per tablet Take 2 tablets by mouth daily with breakfast.  . citalopram (CELEXA) 10 MG tablet Take 1 tablet (10 mg total) by mouth daily.  Melene Muller ON 12/07/2017] citalopram (CELEXA) 10 MG tablet Take 1 tablet (10 mg total) by mouth daily.  . diclofenac sodium (VOLTAREN) 1 % GEL Apply 2 g topically 4 (four) times daily.  Marland Kitchen ipratropium (ATROVENT) 0.02 % nebulizer solution USE 1 VIAL VIA NEBULIZER  EVERY 6 HOURS AS NEEDED FOR WHEEZING OR SHORTNESS OF  BREATH  . IRON PO Take 1 tablet by mouth daily.    . isosorbide mononitrate (IMDUR) 60 MG 24 hr tablet Take 1 tablet (60 mg total) daily by mouth.  . loratadine (CLARITIN) 10 MG tablet Take 10 mg by mouth at bedtime as needed for allergies.   . metoprolol tartrate (LOPRESSOR) 50 MG tablet Take 1 tablet (50 mg total) by mouth 2 (two) times daily.  Marland Kitchen morphine (MSIR) 15 MG tablet Take 1 tablet (15 mg total) by mouth every 4 (four) hours as needed for severe pain.  Marland Kitchen NITROSTAT 0.4 MG SL tablet place 1 tablet under the tongue if needed every 5 minutes for chest pain for 3 doses IF NO RELIEF AFTER 3RD DOSE CALL PRESCRIBER OR 911.  . omeprazole (PRILOSEC OTC) 20 MG tablet Take 20 mg by mouth at bedtime as needed (GERD).   Marland Kitchen  OXYGEN Inhale 2-3 L into the lungs continuous.   . potassium chloride SA (K-DUR,KLOR-CON) 20 MEQ tablet TAKE 2 TABLETS BY MOUTH TWO TIMES DAILY  . Rivaroxaban (XARELTO) 15 MG TABS tablet Take 1 tablet (15 mg total) by mouth daily with supper.  . sertraline (ZOLOFT) 100 MG tablet Take 100 mg by mouth at bedtime.   . simvastatin (ZOCOR) 20 MG tablet TAKE 3 TABLETS BY MOUTH  DAILY AT 6 PM  . spironolactone (ALDACTONE) 25 MG tablet TAKE 1 TABLET BY MOUTH  DAILY  . torsemide (DEMADEX) 20 MG tablet Take 3 tablets (60 mg total) by mouth 2 (two) times daily.  Marland Kitchen umeclidinium-vilanterol (ANORO ELLIPTA) 62.5-25 MCG/INH AEPB Inhale 1 puff into the lungs daily.  Marland Kitchen umeclidinium-vilanterol (ANORO ELLIPTA) 62.5-25 MCG/INH AEPB Inhale 1 puff into the lungs daily.   No facility-administered encounter medications on file as of 11/21/2017.     PHYSICAL EXAM:   General: Chronically ill appearing elderly female sitting at table and in NAD Cardiovascular: regular rate and rhythm Pulmonary: clear but decreased in all fields.  O2 via [place] in use.  Sats 95% on O2..  Unlabored resp. At this time. Abdomen: soft, nontender, + bowel sounds Extremities: no edema, ecchymosis Skin: no rashes Neurological: A&O x3.  Weakness but otherwise nonfocal  Margaretha Sheffield, NP-C

## 2017-11-27 ENCOUNTER — Telehealth: Payer: Self-pay | Admitting: Internal Medicine

## 2017-11-27 NOTE — Telephone Encounter (Signed)
Please see if she can get in with one of the NPs this week

## 2017-11-27 NOTE — Telephone Encounter (Signed)
Called and spoke to patient. Advised her that CY would like her to come in and be seen by an NP. Scheduled an appointment for 5/8 at 3:45 with TP. Nothing further needed at this time.

## 2017-11-27 NOTE — Telephone Encounter (Signed)
Called and spoke to patient. Patient stated that she has been having increased shortness of breath, her nose is runny, reports she has been achy since her fall in March, denies fever, reports overall feeling of not feeling well/fatige. Patient also wants CY to know that she became very itchy with the albuterol and will not be taking that again.  CY please advise, thanks!  Allergies  Allergen Reactions  . Lisinopril Cough    Developed persistent/fry cough for a month.   . Prednisone Rash    jittery  . Tape Other (See Comments)    Bleeding  . Xopenex [Levalbuterol] Other (See Comments)    Made mouth and throat sore  . Cefpodoxime Other (See Comments)    Yeast infections   . Codeine Other (See Comments)    Anxious/ jittery  . Lipitor [Atorvastatin] Other (See Comments)    Made joint start hurting/ Muscle aches  . Other Swelling    Venholin HFA  . Penicillins Other (See Comments)    Has patient had a PCN reaction causing immediate rash, facial/tongue/throat swelling, SOB or lightheadedness with hypotension: unknown, childhood reaction Has patient had a PCN reaction causing severe rash involving mucus membranes or skin necrosis: No Has patient had a PCN reaction that required hospitalization No Has patient had a PCN reaction occurring within the last 10 years: No If all of the above answers are "NO", then may proceed with Cephalosporin use.   Terald Sleeper [Kdc:Albuterol] Other (See Comments)    "jittery"  . Anoro Ellipta [Umeclidinium-Vilanterol]     itchy  . Clindamycin Other (See Comments)    Unknown   . Hydrocodone-Acetaminophen Anxiety  . Latex Itching and Rash  . Levalbuterol Hcl Other (See Comments)    Unknown    Current Outpatient Medications on File Prior to Visit  Medication Sig Dispense Refill  . acetaminophen (TYLENOL) 500 MG tablet Take 1,000 mg by mouth 2 (two) times daily as needed for headache.     . albuterol (PROVENTIL) (2.5 MG/3ML) 0.083% nebulizer solution Take  3 mLs (2.5 mg total) by nebulization every 6 (six) hours as needed for wheezing or shortness of breath. 75 mL 12  . amiodarone (PACERONE) 200 MG tablet Take 1 tablet (200 mg total) by mouth daily. 90 tablet 3  . amLODipine (NORVASC) 2.5 MG tablet TAKE 1 TABLET BY MOUTH  DAILY 90 tablet 3  . benzonatate (TESSALON) 200 MG capsule Take 1 capsule (200 mg total) by mouth 3 (three) times daily as needed for cough. 90 capsule 0  . calcium-vitamin D (OSCAL WITH D) 500-200 MG-UNIT per tablet Take 2 tablets by mouth daily with breakfast.    . diclofenac sodium (VOLTAREN) 1 % GEL Apply 2 g topically 4 (four) times daily. 100 g 0  . ipratropium (ATROVENT) 0.02 % nebulizer solution USE 1 VIAL VIA NEBULIZER  EVERY 6 HOURS AS NEEDED FOR WHEEZING OR SHORTNESS OF  BREATH 812.5 mL 0  . IRON PO Take 1 tablet by mouth daily.    . isosorbide mononitrate (IMDUR) 60 MG 24 hr tablet Take 1 tablet (60 mg total) daily by mouth. 90 tablet 3  . loratadine (CLARITIN) 10 MG tablet Take 10 mg by mouth at bedtime as needed for allergies.     . metoprolol tartrate (LOPRESSOR) 50 MG tablet Take 1 tablet (50 mg total) by mouth 2 (two) times daily. 14 tablet 0  . NITROSTAT 0.4 MG SL tablet place 1 tablet under the tongue if needed every 5 minutes for  chest pain for 3 doses IF NO RELIEF AFTER 3RD DOSE CALL PRESCRIBER OR 911. 25 tablet 0  . omeprazole (PRILOSEC OTC) 20 MG tablet Take 20 mg by mouth at bedtime as needed (GERD).     . OXYGEN Inhale 2-3 L into the lungs continuous.     . potassium chloride SA (K-DUR,KLOR-CON) 20 MEQ tablet TAKE 2 TABLETS BY MOUTH TWO TIMES DAILY 360 tablet 6  . Rivaroxaban (XARELTO) 15 MG TABS tablet Take 1 tablet (15 mg total) by mouth daily with supper. 90 tablet 3  . simvastatin (ZOCOR) 20 MG tablet TAKE 3 TABLETS BY MOUTH  DAILY AT 6 PM 270 tablet 3  . spironolactone (ALDACTONE) 25 MG tablet TAKE 1 TABLET BY MOUTH  DAILY 90 tablet 3  . torsemide (DEMADEX) 20 MG tablet Take 3 tablets (60 mg total) by  mouth 2 (two) times daily. 540 tablet 3   No current facility-administered medications on file prior to visit.

## 2017-11-29 ENCOUNTER — Encounter: Payer: Self-pay | Admitting: Adult Health

## 2017-11-29 ENCOUNTER — Ambulatory Visit: Payer: Medicare Other | Admitting: Adult Health

## 2017-11-29 DIAGNOSIS — R911 Solitary pulmonary nodule: Secondary | ICD-10-CM | POA: Diagnosis not present

## 2017-11-29 DIAGNOSIS — J9611 Chronic respiratory failure with hypoxia: Secondary | ICD-10-CM

## 2017-11-29 DIAGNOSIS — J449 Chronic obstructive pulmonary disease, unspecified: Secondary | ICD-10-CM

## 2017-11-29 DIAGNOSIS — J301 Allergic rhinitis due to pollen: Secondary | ICD-10-CM | POA: Diagnosis not present

## 2017-11-29 MED ORDER — BUDESONIDE-FORMOTEROL FUMARATE 80-4.5 MCG/ACT IN AERO: 2.0000 | INHALATION_SPRAY | Freq: Two times a day (BID) | RESPIRATORY_TRACT | 0 refills | Status: DC

## 2017-11-29 MED ORDER — BUDESONIDE-FORMOTEROL FUMARATE 80-4.5 MCG/ACT IN AERO: 2.0000 | INHALATION_SPRAY | Freq: Two times a day (BID) | RESPIRATORY_TRACT | 1 refills | Status: DC

## 2017-11-29 NOTE — Addendum Note (Signed)
Addended by: Boone Master E on: 11/29/2017 05:01 PM   Modules accepted: Orders

## 2017-11-29 NOTE — Assessment & Plan Note (Signed)
Trial of Symbicot , multiple drug intolerances in past.   Plan  Patient Instructions  Continue on on Claritin daily .  Continue on Saline nasal rinses .  Try Flonase 2 puffs daily .  Begin Symbicort 2 puffs Twice daily  , rinse after use .  Follow up with Dr. Maple Hudson in 1 month and As needed   Please contact office for sooner follow up if symptoms do not improve or worsen or seek emergency care

## 2017-11-29 NOTE — Progress Notes (Signed)
@Patient  ID: [name], female    DOB: 1941/01/21, 77 y.o.   MRN: [id number]  Chief Complaint  Patient presents with  . Acute Visit    COPD     Referring provider: Maurice Small, MD  HPI: 77 year old female followed for COPD, chronic rhinitis, PLMS Past medical history significant for bipolar hypertension, congestive heart failure, coronary disease and A. Fib  TEST  PFT 10/26/16-severe obstructive airways disease, insignificant response, severe restriction, severe diffusion defect.  FVC 1.38/46%, FEV1 0.98/43%, ratio 0.71, TLC 69%, DLCO 42%  11/29/2017 Acute OV : COPD /allergic rhinitis  Pt presents for an acute office visit. Complains of increased nasal drainage , watery eyes, , post nasal drip . No increased cough . No fever or discolored mucus .  Taking claritin but not helping .   Has Severe COPD . Says she gets very winded with minimal activity . No dyspnea at rest.  Remains on Oxygen 2l/m .  Says has tried [facility] in past , could not tolerate . Does not like albuterol or atrovent .  Would like to try some new inhaler to see if will help with breathing .   CT chest 10/2017 showed stable lung nodules . Plan to repeat CT chest in 1 year .    Allergies  Allergen Reactions  . Lisinopril Cough    Developed persistent/fry cough for a month.   . Prednisone Rash    jittery  . Tape Other (See Comments)    Bleeding  . Xopenex [Levalbuterol] Other (See Comments)    Made mouth and throat sore  . Cefpodoxime Other (See Comments)    Yeast infections   . Codeine Other (See Comments)    Anxious/ jittery  . Lipitor [Atorvastatin] Other (See Comments)    Made joint start hurting/ Muscle aches  . Other Swelling    Venholin HFA  . Penicillins Other (See Comments)    Has patient had a PCN reaction causing immediate rash, facial/tongue/throat swelling, SOB or lightheadedness with hypotension: unknown, childhood reaction Has patient had a PCN reaction causing severe rash involving  mucus membranes or skin necrosis: No Has patient had a PCN reaction that required hospitalization No Has patient had a PCN reaction occurring within the last 10 years: No If all of the above answers are "NO", then may proceed with Cephalosporin use.   Terald Sleeper [Kdc:Albuterol] Other (See Comments)    "jittery"  . Anoro Ellipta [Umeclidinium-Vilanterol]     itchy  . Clindamycin Other (See Comments)    Unknown   . Hydrocodone-Acetaminophen Anxiety  . Latex Itching and Rash  . Levalbuterol Hcl Other (See Comments)    Unknown     Immunization History  Administered Date(s) Administered  . Influenza Split 04/03/2012, 04/15/2014  . Influenza Whole 04/01/2011  . Influenza, High Dose Seasonal PF 04/18/2016, 04/24/2017  . Influenza,inj,Quad PF,6+ Mos 05/09/2013, 04/16/2015  . Pneumococcal Conjugate-13 06/24/2014  . Pneumococcal Polysaccharide-23 07/25/2008  . Tdap 05/25/2012    Past Medical History:  Diagnosis Date  . Acute parotitis 01/05/2016  . Anxiety   . Bacteremia, escherichia coli   . Bursitis, shoulder    bilateral  . CHF (congestive heart failure) (HCC)    chronic diasotlic CHF  . Complication of anesthesia    "have trouble getting me awake sometimes; been ok latelhy" (09/24/2015)  . COPD (chronic obstructive pulmonary disease) (HCC)   . Coronary heart disease 2001   s/p CABG  . Depression   . DJD (degenerative joint disease)   .  GERD (gastroesophageal reflux disease)   . H/O hiatal hernia   . History of rheumatic heart disease    X 3-LAST TIME WHEN PATIENT WAS 77 YRS OLD  . Hyperlipidemia   . Hypertension   . OA (osteoarthritis)   . On home oxygen therapy    "2L; 24/7" (09/24/2015)  . PUD (peptic ulcer disease)   . Pulmonary HTN (HCC)    PASP 50-62mmHg by [id number]  . Pulmonary HTN (HCC)   . Sleep apnea    intolerant to CPAP  . Thyroid nodule   . Urinary incontinence   . Varicose veins     Tobacco History: Social History   Tobacco Use  Smoking Status  Former Smoker  . Packs/day: 1.00  . Years: 47.00  . Pack years: 47.00  . Types: Cigarettes  . Last attempt to quit: 07/26/2007  . Years since quitting: 10.3  Smokeless Tobacco Never Used   Counseling given: Not Answered   Outpatient Encounter Medications as of 11/29/2017  Medication Sig  . acetaminophen (TYLENOL) 500 MG tablet Take 1,000 mg by mouth 2 (two) times daily as needed for headache.   Marland Kitchen amiodarone (PACERONE) 200 MG tablet Take 1 tablet (200 mg total) by mouth daily.  Marland Kitchen amLODipine (NORVASC) 2.5 MG tablet TAKE 1 TABLET BY MOUTH  DAILY  . benzonatate (TESSALON) 200 MG capsule Take 1 capsule (200 mg total) by mouth 3 (three) times daily as needed for cough.  . calcium-vitamin D (OSCAL WITH D) 500-200 MG-UNIT per tablet Take 2 tablets by mouth daily with breakfast.  . diclofenac sodium (VOLTAREN) 1 % GEL Apply 2 g topically 4 (four) times daily.  . IRON PO Take 1 tablet by mouth daily.  . isosorbide mononitrate (IMDUR) 60 MG 24 hr tablet Take 1 tablet (60 mg total) daily by mouth.  . loratadine (CLARITIN) 10 MG tablet Take 10 mg by mouth at bedtime as needed for allergies.   . metoprolol tartrate (LOPRESSOR) 50 MG tablet Take 1 tablet (50 mg total) by mouth 2 (two) times daily.  Marland Kitchen NITROSTAT 0.4 MG SL tablet place 1 tablet under the tongue if needed every 5 minutes for chest pain for 3 doses IF NO RELIEF AFTER 3RD DOSE CALL PRESCRIBER OR 911.  . omeprazole (PRILOSEC OTC) 20 MG tablet Take 40 mg by mouth daily.   . OXYGEN Inhale 2-3 L into the lungs continuous.   . potassium chloride SA (K-DUR,KLOR-CON) 20 MEQ tablet TAKE 2 TABLETS BY MOUTH TWO TIMES DAILY  . Rivaroxaban (XARELTO) 15 MG TABS tablet Take 1 tablet (15 mg total) by mouth daily with supper.  Marland Kitchen spironolactone (ALDACTONE) 25 MG tablet TAKE 1 TABLET BY MOUTH  DAILY  . torsemide (DEMADEX) 20 MG tablet Take 3 tablets (60 mg total) by mouth 2 (two) times daily.  Marland Kitchen albuterol (PROVENTIL) (2.5 MG/3ML) 0.083% nebulizer solution Take  3 mLs (2.5 mg total) by nebulization every 6 (six) hours as needed for wheezing or shortness of breath. (Patient not taking: Reported on 11/29/2017)  . ipratropium (ATROVENT) 0.02 % nebulizer solution USE 1 VIAL VIA NEBULIZER  EVERY 6 HOURS AS NEEDED FOR WHEEZING OR SHORTNESS OF  BREATH (Patient not taking: Reported on 11/29/2017)  . simvastatin (ZOCOR) 20 MG tablet TAKE 3 TABLETS BY MOUTH  DAILY AT 6 PM (Patient not taking: Reported on 11/29/2017)   No facility-administered encounter medications on file as of 11/29/2017.      Review of Systems  Constitutional:   No  weight loss, night sweats,  Fevers, chills, + fatigue, or  lassitude.  HEENT:   No headaches,  Difficulty swallowing,  Tooth/dental problems, or  Sore throat,                No sneezing, itching, ear ache, nasal congestion, post nasal drip,   CV:  No chest pain,  Orthopnea, PND, swelling in lower extremities, anasarca, dizziness, palpitations, syncope.   GI  No heartburn, indigestion, abdominal pain, nausea, vomiting, diarrhea, change in bowel habits, loss of appetite, bloody stools.   Resp:   No chest wall deformity  Skin: no rash or lesions.  GU: no dysuria, change in color of urine, no urgency or frequency.  No flank pain, no hematuria   MS:  No joint pain or swelling.  No decreased range of motion.  No back pain.    Physical Exam  BP 132/66 (BP Location: Left Arm, Cuff Size: Normal)   Pulse 68   Ht 5\' 6"  (1.676 m)   Wt 193 lb (87.5 kg)   SpO2 96%   BMI 31.15 kg/m   GEN: A/Ox3; pleasant , NAD, obese    HEENT:  [place]/AT,  EACs-clear, TMs-wnl, NOSE-clear drainage  THROAT-clear, no lesions, no postnasal drip or exudate noted.   NECK:  Supple w/ fair ROM; no JVD; normal carotid impulses w/o bruits; no thyromegaly or nodules palpated; no lymphadenopathy.    RESP  Clear  P & A; w/o, wheezes/ rales/ or rhonchi. no accessory muscle use, no dullness to percussion  CARD:  RRR, no m/r/g, no peripheral edema, pulses intact,  no cyanosis or clubbing.  GI:   Soft & nt; nml bowel sounds; no organomegaly or masses detected.   Musco: Warm bil, no deformities or joint swelling noted.   Neuro: alert, no focal deficits noted.    Skin: Warm, no lesions or rashes    Lab Results:  CBC  BMET  BNP  Imaging: Ct Chest Wo Contrast  Result Date: 11/01/2017 CLINICAL DATA:  Follow-up small pulmonary nodules. EXAM: CT CHEST WITHOUT CONTRAST TECHNIQUE: Multidetector CT imaging of the chest was performed following the standard protocol without IV contrast. COMPARISON:  11/03/2016 FINDINGS: Cardiovascular: Previous median sternotomy and CABG procedure. Moderate cardiac enlargement. Aortic atherosclerosis. Calcification involving all 3 native coronary arteries noted. Mediastinum/Nodes: Status post right thyroidectomy. Multiple small nodules noted in the left lobe of gland. The trachea appears patent and is midline. Normal appearance of the esophagus. No enlarged mediastinal or hilar lymph nodes. Lungs/Pleura: Ground-glass attenuating nodule within the left upper lobe measures 9 mm, image 32/3. Unchanged from prior exam. Scattered small solid nodules are again noted. 3 mm posterior right upper lobe lung nodule is stable, image 33/3. Calcified granuloma identified within the right lower lobe. Granuloma within the periphery of the left upper lobe also noted. Upper Abdomen: No acute abnormality. Musculoskeletal: Degenerative disc disease noted within the thoracic spine. Stable mild compression deformity involving the T12 vertebra. There is a new mild inferior endplate deformity involving T5, image 84/6. IMPRESSION: 1. Stable exam. Tiny pulmonary nodules are unchanged from 11/03/2016 compatible with a benign process. No further follow-up of these solid nodules indicated at this time. 2. Stable left upper lobe ground-glass nodule. Adenocarcinoma cannot be excluded. Continued annual surveillance for a minimum of 3 years recommended. These  recommendations are taken from: Recommendations for the Management of Subsolid Pulmonary Nodules Detected at CT: A Statement from the Fleischner Society Radiology 2013; 266:1, 304-317. 3.  Aortic Atherosclerosis (ICD10-I70.0). 4. New mild inferior endplate compression deformity involves  T5. Stable T12 compression fracture. Electronically Signed   By: Signa Kell M.D.   On: 11/01/2017 13:42     Assessment & Plan:   COPD mixed type (HCC) Trial of Symbicot , multiple drug intolerances in past.   Plan  Patient Instructions  Continue on on Claritin daily .  Continue on Saline nasal rinses .  Try Flonase 2 puffs daily .  Begin Symbicort 2 puffs Twice daily  , rinse after use .  Follow up with Dr. Maple Hudson in 1 month and As needed   Please contact office for sooner follow up if symptoms do not improve or worsen or seek emergency care        Chronic respiratory failure with hypoxia (HCC) Cont on O2   Seasonal allergic rhinitis Flare  Take claritin /zyrtec  Add flonase   Plan  . Patient Instructions  Continue on on Claritin daily .  Continue on Saline nasal rinses .  Try Flonase 2 puffs daily .  Begin Symbicort 2 puffs Twice daily  , rinse after use .  Follow up with Dr. Maple Hudson in 1 month and As needed   Please contact office for sooner follow up if symptoms do not improve or worsen or seek emergency care       Lung nodule Stable on CT chest  Check in 1 year     Rubye Oaks, NP 11/29/2017

## 2017-11-29 NOTE — Assessment & Plan Note (Signed)
Flare  Take claritin /zyrtec  Add flonase   Plan  . Patient Instructions  Continue on on Claritin daily .  Continue on Saline nasal rinses .  Try Flonase 2 puffs daily .  Begin Symbicort 2 puffs Twice daily  , rinse after use .  Follow up with Dr. Maple Hudson in 1 month and As needed   Please contact office for sooner follow up if symptoms do not improve or worsen or seek emergency care

## 2017-11-29 NOTE — Assessment & Plan Note (Signed)
Stable on CT chest  Check in 1 year

## 2017-11-29 NOTE — Patient Instructions (Addendum)
Continue on on Claritin daily .  Continue on Saline nasal rinses .  Try Flonase 2 puffs daily .  Begin Symbicort 2 puffs Twice daily  , rinse after use .  Follow up with Dr. Maple Hudson in 1 month and As needed   Please contact office for sooner follow up if symptoms do not improve or worsen or seek emergency care

## 2017-11-29 NOTE — Assessment & Plan Note (Signed)
Cont on O2 .  

## 2017-11-29 NOTE — Progress Notes (Signed)
Patient seen in the office today and instructed on use of Symbicort 80/4.57mcg.  Patient expressed understanding and demonstrated technique. Boone Master, CMA 11/29/17

## 2017-11-30 ENCOUNTER — Encounter: Payer: Self-pay | Admitting: Adult Health

## 2017-11-30 NOTE — Telephone Encounter (Signed)
Error

## 2017-12-05 ENCOUNTER — Telehealth (HOSPITAL_COMMUNITY): Payer: Self-pay

## 2017-12-05 NOTE — Telephone Encounter (Signed)
HHRN with [facility] calling to report patient weight up 3 lbs and more SOB. Patient states she reduced Torsemide to 60 mg once daily as was told by Otilio Saber PA-C during her appt with [place] a month ago in the CHF clinic. Per Andy's note, however, reports patient assessment euvolemic on exam and advised to continue current diuretic dose of Torsemide 60 mg BID. Verbalized this to [employer] who will educate patient and will continue to monitor weight and assessment this week.  [name], RN

## 2017-12-11 ENCOUNTER — Other Ambulatory Visit (HOSPITAL_COMMUNITY): Payer: Self-pay | Admitting: *Deleted

## 2017-12-11 MED ORDER — AMIODARONE HCL 200 MG PO TABS: 200.0000 mg | ORAL_TABLET | Freq: Every day | ORAL | 3 refills | Status: DC

## 2017-12-13 ENCOUNTER — Other Ambulatory Visit: Payer: Self-pay

## 2017-12-13 ENCOUNTER — Inpatient Hospital Stay (HOSPITAL_COMMUNITY)
Admission: EM | Admit: 2017-12-13 | Discharge: 2017-12-26 | DRG: 291 | Disposition: A | Discharge status: Skilled Nursing Facility | Payer: Medicare Other | Attending: Nephrology | Admitting: Nephrology

## 2017-12-13 ENCOUNTER — Telehealth (HOSPITAL_COMMUNITY): Payer: Self-pay

## 2017-12-13 ENCOUNTER — Encounter (HOSPITAL_COMMUNITY): Payer: Self-pay

## 2017-12-13 ENCOUNTER — Emergency Department (HOSPITAL_COMMUNITY): Payer: Medicare Other

## 2017-12-13 DIAGNOSIS — E785 Hyperlipidemia, unspecified: Secondary | ICD-10-CM | POA: Diagnosis present

## 2017-12-13 DIAGNOSIS — Z881 Allergy status to other antibiotic agents status: Secondary | ICD-10-CM

## 2017-12-13 DIAGNOSIS — N183 Chronic kidney disease, stage 3 (moderate): Secondary | ICD-10-CM | POA: Diagnosis present

## 2017-12-13 DIAGNOSIS — I480 Paroxysmal atrial fibrillation: Secondary | ICD-10-CM | POA: Diagnosis not present

## 2017-12-13 DIAGNOSIS — Z87891 Personal history of nicotine dependence: Secondary | ICD-10-CM

## 2017-12-13 DIAGNOSIS — I272 Pulmonary hypertension, unspecified: Secondary | ICD-10-CM | POA: Diagnosis present

## 2017-12-13 DIAGNOSIS — E8770 Fluid overload, unspecified: Secondary | ICD-10-CM | POA: Diagnosis not present

## 2017-12-13 DIAGNOSIS — K449 Diaphragmatic hernia without obstruction or gangrene: Secondary | ICD-10-CM | POA: Diagnosis present

## 2017-12-13 DIAGNOSIS — J189 Pneumonia, unspecified organism: Secondary | ICD-10-CM | POA: Diagnosis present

## 2017-12-13 DIAGNOSIS — I10 Essential (primary) hypertension: Secondary | ICD-10-CM | POA: Diagnosis present

## 2017-12-13 DIAGNOSIS — B964 Proteus (mirabilis) (morganii) as the cause of diseases classified elsewhere: Secondary | ICD-10-CM | POA: Diagnosis present

## 2017-12-13 DIAGNOSIS — Z91048 Other nonmedicinal substance allergy status: Secondary | ICD-10-CM

## 2017-12-13 DIAGNOSIS — I5033 Acute on chronic diastolic (congestive) heart failure: Secondary | ICD-10-CM | POA: Diagnosis not present

## 2017-12-13 DIAGNOSIS — F419 Anxiety disorder, unspecified: Secondary | ICD-10-CM | POA: Diagnosis present

## 2017-12-13 DIAGNOSIS — T380X5A Adverse effect of glucocorticoids and synthetic analogues, initial encounter: Secondary | ICD-10-CM | POA: Diagnosis present

## 2017-12-13 DIAGNOSIS — G4733 Obstructive sleep apnea (adult) (pediatric): Secondary | ICD-10-CM | POA: Diagnosis present

## 2017-12-13 DIAGNOSIS — K219 Gastro-esophageal reflux disease without esophagitis: Secondary | ICD-10-CM | POA: Diagnosis present

## 2017-12-13 DIAGNOSIS — Z7901 Long term (current) use of anticoagulants: Secondary | ICD-10-CM

## 2017-12-13 DIAGNOSIS — N3000 Acute cystitis without hematuria: Secondary | ICD-10-CM | POA: Diagnosis not present

## 2017-12-13 DIAGNOSIS — Z8711 Personal history of peptic ulcer disease: Secondary | ICD-10-CM

## 2017-12-13 DIAGNOSIS — J440 Chronic obstructive pulmonary disease with acute lower respiratory infection: Secondary | ICD-10-CM | POA: Diagnosis present

## 2017-12-13 DIAGNOSIS — Z888 Allergy status to other drugs, medicaments and biological substances status: Secondary | ICD-10-CM

## 2017-12-13 DIAGNOSIS — E873 Alkalosis: Secondary | ICD-10-CM | POA: Diagnosis not present

## 2017-12-13 DIAGNOSIS — R0603 Acute respiratory distress: Secondary | ICD-10-CM | POA: Insufficient documentation

## 2017-12-13 DIAGNOSIS — J9612 Chronic respiratory failure with hypercapnia: Secondary | ICD-10-CM | POA: Diagnosis present

## 2017-12-13 DIAGNOSIS — F320 Major depressive disorder, single episode, mild: Secondary | ICD-10-CM | POA: Diagnosis not present

## 2017-12-13 DIAGNOSIS — Z825 Family history of asthma and other chronic lower respiratory diseases: Secondary | ICD-10-CM

## 2017-12-13 DIAGNOSIS — F329 Major depressive disorder, single episode, unspecified: Secondary | ICD-10-CM | POA: Diagnosis present

## 2017-12-13 DIAGNOSIS — I080 Rheumatic disorders of both mitral and aortic valves: Secondary | ICD-10-CM | POA: Diagnosis present

## 2017-12-13 DIAGNOSIS — N3090 Cystitis, unspecified without hematuria: Secondary | ICD-10-CM | POA: Diagnosis present

## 2017-12-13 DIAGNOSIS — I4819 Other persistent atrial fibrillation: Secondary | ICD-10-CM | POA: Diagnosis present

## 2017-12-13 DIAGNOSIS — N179 Acute kidney failure, unspecified: Secondary | ICD-10-CM | POA: Diagnosis present

## 2017-12-13 DIAGNOSIS — Z800 Family history of malignant neoplasm of digestive organs: Secondary | ICD-10-CM

## 2017-12-13 DIAGNOSIS — R0602 Shortness of breath: Secondary | ICD-10-CM

## 2017-12-13 DIAGNOSIS — Z885 Allergy status to narcotic agent status: Secondary | ICD-10-CM

## 2017-12-13 DIAGNOSIS — I050 Rheumatic mitral stenosis: Secondary | ICD-10-CM | POA: Diagnosis not present

## 2017-12-13 DIAGNOSIS — I2510 Atherosclerotic heart disease of native coronary artery without angina pectoris: Secondary | ICD-10-CM | POA: Diagnosis not present

## 2017-12-13 DIAGNOSIS — I25119 Atherosclerotic heart disease of native coronary artery with unspecified angina pectoris: Secondary | ICD-10-CM | POA: Diagnosis present

## 2017-12-13 DIAGNOSIS — I130 Hypertensive heart and chronic kidney disease with heart failure and stage 1 through stage 4 chronic kidney disease, or unspecified chronic kidney disease: Principal | ICD-10-CM | POA: Diagnosis present

## 2017-12-13 DIAGNOSIS — I429 Cardiomyopathy, unspecified: Secondary | ICD-10-CM | POA: Diagnosis present

## 2017-12-13 DIAGNOSIS — Z880 Allergy status to penicillin: Secondary | ICD-10-CM

## 2017-12-13 DIAGNOSIS — I330 Acute and subacute infective endocarditis: Secondary | ICD-10-CM | POA: Diagnosis present

## 2017-12-13 DIAGNOSIS — J441 Chronic obstructive pulmonary disease with (acute) exacerbation: Secondary | ICD-10-CM | POA: Diagnosis not present

## 2017-12-13 DIAGNOSIS — J9621 Acute and chronic respiratory failure with hypoxia: Secondary | ICD-10-CM | POA: Diagnosis present

## 2017-12-13 DIAGNOSIS — G629 Polyneuropathy, unspecified: Secondary | ICD-10-CM | POA: Diagnosis present

## 2017-12-13 DIAGNOSIS — E890 Postprocedural hypothyroidism: Secondary | ICD-10-CM | POA: Diagnosis present

## 2017-12-13 DIAGNOSIS — Z9981 Dependence on supplemental oxygen: Secondary | ICD-10-CM

## 2017-12-13 DIAGNOSIS — Z90710 Acquired absence of both cervix and uterus: Secondary | ICD-10-CM

## 2017-12-13 DIAGNOSIS — I5032 Chronic diastolic (congestive) heart failure: Secondary | ICD-10-CM | POA: Diagnosis present

## 2017-12-13 DIAGNOSIS — R000 Tachycardia, unspecified: Secondary | ICD-10-CM | POA: Diagnosis present

## 2017-12-13 DIAGNOSIS — I482 Chronic atrial fibrillation: Secondary | ICD-10-CM | POA: Diagnosis present

## 2017-12-13 DIAGNOSIS — Z951 Presence of aortocoronary bypass graft: Secondary | ICD-10-CM

## 2017-12-13 DIAGNOSIS — J449 Chronic obstructive pulmonary disease, unspecified: Secondary | ICD-10-CM | POA: Diagnosis present

## 2017-12-13 DIAGNOSIS — R0789 Other chest pain: Secondary | ICD-10-CM

## 2017-12-13 DIAGNOSIS — I428 Other cardiomyopathies: Secondary | ICD-10-CM | POA: Diagnosis not present

## 2017-12-13 DIAGNOSIS — Z79899 Other long term (current) drug therapy: Secondary | ICD-10-CM

## 2017-12-13 DIAGNOSIS — D649 Anemia, unspecified: Secondary | ICD-10-CM | POA: Diagnosis present

## 2017-12-13 LAB — I-STAT ARTERIAL BLOOD GAS, ED
ACID-BASE EXCESS: 15 mmol/L — AB (ref 0.0–2.0)
Acid-Base Excess: 13 mmol/L — ABNORMAL HIGH (ref 0.0–2.0)
BICARBONATE: 37.7 mmol/L — AB (ref 20.0–28.0)
Bicarbonate: 37.9 mmol/L — ABNORMAL HIGH (ref 20.0–28.0)
O2 SAT: 94 %
O2 SAT: 100 %
PCO2 ART: 39.8 mmHg (ref 32.0–48.0)
PO2 ART: 67 mmHg — AB (ref 83.0–108.0)
PO2 ART: 149 mmHg — AB (ref 83.0–108.0)
Patient temperature: 98.6
Patient temperature: 98.6
TCO2: 39 mmol/L — ABNORMAL HIGH (ref 22–32)
TCO2: 39 mmol/L — ABNORMAL HIGH (ref 22–32)
pCO2 arterial: 48 mmHg (ref 32.0–48.0)
pH, Arterial: 7.503 — ABNORMAL HIGH (ref 7.350–7.450)
pH, Arterial: 7.587 — ABNORMAL HIGH (ref 7.350–7.450)

## 2017-12-13 LAB — CBC
HCT: 34 % — ABNORMAL LOW (ref 36.0–46.0)
Hemoglobin: 10.2 g/dL — ABNORMAL LOW (ref 12.0–15.0)
MCH: 28.1 pg (ref 26.0–34.0)
MCHC: 30 g/dL (ref 30.0–36.0)
MCV: 93.7 fL (ref 78.0–100.0)
PLATELETS: 217 10*3/uL (ref 150–400)
RBC: 3.63 MIL/uL — ABNORMAL LOW (ref 3.87–5.11)
RDW: 13.1 % (ref 11.5–15.5)
WBC: 7.5 10*3/uL (ref 4.0–10.5)

## 2017-12-13 LAB — BASIC METABOLIC PANEL
Anion gap: 11 (ref 5–15)
BUN: 15 mg/dL (ref 6–20)
CALCIUM: 9.5 mg/dL (ref 8.9–10.3)
CHLORIDE: 93 mmol/L — AB (ref 101–111)
CO2: 37 mmol/L — AB (ref 22–32)
CREATININE: 1.28 mg/dL — AB (ref 0.44–1.00)
GFR calc Af Amer: 45 mL/min — ABNORMAL LOW (ref >60)
GFR calc non Af Amer: 39 mL/min — ABNORMAL LOW (ref >60)
GLUCOSE: 150 mg/dL — AB (ref 65–99)
Potassium: 3.8 mmol/L (ref 3.5–5.1)
Sodium: 141 mmol/L (ref 135–145)

## 2017-12-13 LAB — TROPONIN I: Troponin I: <0.03 ng/mL (ref <0.03)

## 2017-12-13 LAB — I-STAT TROPONIN, ED: TROPONIN I, POC: 0 ng/mL (ref 0.00–0.08)

## 2017-12-13 LAB — BRAIN NATRIURETIC PEPTIDE: B Natriuretic Peptide: 145.7 pg/mL — ABNORMAL HIGH (ref 0.0–100.0)

## 2017-12-13 MED ORDER — SIMVASTATIN 40 MG PO TABS
60.0000 mg | ORAL_TABLET | Freq: Every day | ORAL | Status: DC
  Administered 2017-12-14 – 2017-12-26 (×13): 60 mg via ORAL
  Filled 2017-12-13 (×14): qty 1

## 2017-12-13 MED ORDER — RIVAROXABAN 15 MG PO TABS
15.0000 mg | ORAL_TABLET | Freq: Every day | ORAL | Status: DC
  Administered 2017-12-14 – 2017-12-26 (×13): 15 mg via ORAL
  Filled 2017-12-13 (×15): qty 1

## 2017-12-13 MED ORDER — CALCIUM CARBONATE-VITAMIN D 500-200 MG-UNIT PO TABS
2.0000 | ORAL_TABLET | Freq: Every day | ORAL | Status: DC
  Administered 2017-12-14 – 2017-12-26 (×13): 2 via ORAL
  Filled 2017-12-13 (×13): qty 2

## 2017-12-13 MED ORDER — SPIRONOLACTONE 25 MG PO TABS
25.0000 mg | ORAL_TABLET | Freq: Every day | ORAL | Status: DC
  Administered 2017-12-15 – 2017-12-17 (×3): 25 mg via ORAL
  Filled 2017-12-13 (×4): qty 1

## 2017-12-13 MED ORDER — ISOSORBIDE MONONITRATE ER 60 MG PO TB24
60.0000 mg | ORAL_TABLET | Freq: Every day | ORAL | Status: DC
  Administered 2017-12-14 – 2017-12-26 (×13): 60 mg via ORAL
  Filled 2017-12-13 (×13): qty 1

## 2017-12-13 MED ORDER — MORPHINE SULFATE (PF) 4 MG/ML IV SOLN
2.0000 mg | Freq: Once | INTRAVENOUS | Status: AC
  Administered 2017-12-13: 2 mg via INTRAVENOUS
  Filled 2017-12-13: qty 1

## 2017-12-13 MED ORDER — ONDANSETRON HCL 4 MG PO TABS: 4.0000 mg | ORAL_TABLET | Freq: Four times a day (QID) | ORAL | Status: DC | PRN

## 2017-12-13 MED ORDER — DICLOFENAC SODIUM 1 % TD GEL
2.0000 g | Freq: Three times a day (TID) | TRANSDERMAL | Status: DC
  Administered 2017-12-15 – 2017-12-25 (×8): 2 g via TOPICAL
  Filled 2017-12-13: qty 100

## 2017-12-13 MED ORDER — BISACODYL 5 MG PO TBEC: 5.0000 mg | DELAYED_RELEASE_TABLET | Freq: Every day | ORAL | Status: DC | PRN

## 2017-12-13 MED ORDER — ASPIRIN 81 MG PO CHEW
324.0000 mg | CHEWABLE_TABLET | Freq: Once | ORAL | Status: AC
  Administered 2017-12-13: 324 mg via ORAL
  Filled 2017-12-13: qty 4

## 2017-12-13 MED ORDER — IPRATROPIUM-ALBUTEROL 0.5-2.5 (3) MG/3ML IN SOLN
3.0000 mL | RESPIRATORY_TRACT | Status: DC
  Administered 2017-12-13 – 2017-12-14 (×5): 3 mL via RESPIRATORY_TRACT
  Filled 2017-12-13 (×5): qty 3

## 2017-12-13 MED ORDER — METHYLPREDNISOLONE SODIUM SUCC 125 MG IJ SOLR
60.0000 mg | Freq: Two times a day (BID) | INTRAMUSCULAR | Status: DC
  Administered 2017-12-14 – 2017-12-18 (×9): 60 mg via INTRAVENOUS
  Filled 2017-12-13 (×9): qty 2

## 2017-12-13 MED ORDER — TRAZODONE HCL 50 MG PO TABS
25.0000 mg | ORAL_TABLET | Freq: Every evening | ORAL | Status: DC | PRN
  Administered 2017-12-20 – 2017-12-25 (×4): 25 mg via ORAL
  Filled 2017-12-13 (×4): qty 1

## 2017-12-13 MED ORDER — ALBUTEROL SULFATE (2.5 MG/3ML) 0.083% IN NEBU: 5.0000 mg | INHALATION_SOLUTION | Freq: Once | RESPIRATORY_TRACT | Status: DC

## 2017-12-13 MED ORDER — ALBUTEROL SULFATE (2.5 MG/3ML) 0.083% IN NEBU
5.0000 mg | INHALATION_SOLUTION | Freq: Once | RESPIRATORY_TRACT | Status: AC
  Administered 2017-12-13: 5 mg via RESPIRATORY_TRACT
  Filled 2017-12-13: qty 6

## 2017-12-13 MED ORDER — LORATADINE 10 MG PO TABS
10.0000 mg | ORAL_TABLET | Freq: Every evening | ORAL | Status: DC | PRN
  Administered 2017-12-18: 10 mg via ORAL
  Filled 2017-12-13 (×2): qty 1

## 2017-12-13 MED ORDER — NITROGLYCERIN 0.4 MG SL SUBL: 0.4000 mg | SUBLINGUAL_TABLET | SUBLINGUAL | Status: DC | PRN

## 2017-12-13 MED ORDER — IPRATROPIUM-ALBUTEROL 0.5-2.5 (3) MG/3ML IN SOLN
3.0000 mL | Freq: Once | RESPIRATORY_TRACT | Status: AC
  Administered 2017-12-13: 3 mL via RESPIRATORY_TRACT
  Filled 2017-12-13: qty 3

## 2017-12-13 MED ORDER — AMLODIPINE BESYLATE 2.5 MG PO TABS
2.5000 mg | ORAL_TABLET | Freq: Every day | ORAL | Status: DC
  Administered 2017-12-14 – 2017-12-26 (×13): 2.5 mg via ORAL
  Filled 2017-12-13 (×13): qty 1

## 2017-12-13 MED ORDER — METHYLPREDNISOLONE SODIUM SUCC 125 MG IJ SOLR
125.0000 mg | Freq: Once | INTRAMUSCULAR | Status: AC
  Administered 2017-12-13: 125 mg via INTRAVENOUS
  Filled 2017-12-13: qty 2

## 2017-12-13 MED ORDER — LORAZEPAM 2 MG/ML IJ SOLN
0.5000 mg | INTRAMUSCULAR | Status: DC | PRN
  Administered 2017-12-14: 0.5 mg via INTRAVENOUS
  Filled 2017-12-13: qty 1

## 2017-12-13 MED ORDER — TORSEMIDE 20 MG PO TABS
60.0000 mg | ORAL_TABLET | Freq: Two times a day (BID) | ORAL | Status: DC
  Administered 2017-12-14 – 2017-12-17 (×6): 60 mg via ORAL
  Filled 2017-12-13 (×7): qty 3

## 2017-12-13 MED ORDER — PANTOPRAZOLE SODIUM 40 MG PO TBEC
80.0000 mg | DELAYED_RELEASE_TABLET | Freq: Every day | ORAL | Status: DC
  Administered 2017-12-14 – 2017-12-26 (×13): 80 mg via ORAL
  Filled 2017-12-13 (×13): qty 2

## 2017-12-13 MED ORDER — BENZONATATE 100 MG PO CAPS: 200.0000 mg | ORAL_CAPSULE | Freq: Three times a day (TID) | ORAL | Status: DC | PRN

## 2017-12-13 MED ORDER — AMIODARONE HCL 200 MG PO TABS
200.0000 mg | ORAL_TABLET | Freq: Every day | ORAL | Status: DC
  Administered 2017-12-14 – 2017-12-26 (×13): 200 mg via ORAL
  Filled 2017-12-13 (×13): qty 1

## 2017-12-13 MED ORDER — METOPROLOL TARTRATE 50 MG PO TABS
50.0000 mg | ORAL_TABLET | Freq: Two times a day (BID) | ORAL | Status: DC
Start: 2017-12-13 — End: 2017-12-26
  Administered 2017-12-14 – 2017-12-26 (×24): 50 mg via ORAL
  Filled 2017-12-13 (×24): qty 1

## 2017-12-13 MED ORDER — ONDANSETRON HCL 4 MG/2ML IJ SOLN
4.0000 mg | Freq: Four times a day (QID) | INTRAMUSCULAR | Status: DC | PRN
  Filled 2017-12-13: qty 2

## 2017-12-13 MED ORDER — ACETAMINOPHEN 500 MG PO TABS
1000.0000 mg | ORAL_TABLET | Freq: Two times a day (BID) | ORAL | Status: DC | PRN
  Administered 2017-12-14 – 2017-12-18 (×4): 1000 mg via ORAL
  Filled 2017-12-13 (×4): qty 2

## 2017-12-13 NOTE — ED Provider Notes (Signed)
Medical screening examination/treatment/procedure(s) were conducted as a shared visit with non-physician practitioner(s) and myself.  I personally evaluated the patient during the encounter.  EKG Interpretation  Date/Time:  Wednesday Dec 13 2017 11:47:56 EDT Ventricular Rate:  92 PR Interval:    QRS Duration: 157 QT Interval:  428 QTC Calculation: 530 R Axis:   -84 Text Interpretation:  Sinus rhythm Short PR interval RBBB and LAFB Probable left ventricular hypertrophy Lateral infarct, acute no change from previous.  Confirmed by Arby Barrette [phone]) on 12/13/2017 11:59:24 AM At baseline patient is on 2 L home oxygen.  She reports when she is seated and at rest she is typically fairly comfortable.  Exertion she gets short of breath.  Over the past couple days she has gotten increasingly short of breath and feels short of breath at rest.  She did have chest tightness this morning.  EMS administered nitroglycerin which was helpful.  Patient is alert and appropriate.  She has mild to moderate increased work of breathing at rest.  Heart is regular.  No gross rub or gallop.  Lungs poor airflow to the bases.  Fine expiratory wheeze.  Consultation has been completed by cardiology.  At this time, plan will be for observation admission for COPD exacerbation.  Agree with plan and management.   Arby Barrette, MD 12/13/17 1535

## 2017-12-13 NOTE — Progress Notes (Signed)
RT placed pt on BIPAP per MD order.

## 2017-12-13 NOTE — ED Notes (Signed)
ED Provider at bedside. 

## 2017-12-13 NOTE — Telephone Encounter (Signed)
[facility] RN called to report patient with new onset chest tightness unrelieved with position, rest, meds since waking up this morning. States her hands also feel cold to her, however RN reports they feel warm to touch. Patients HR ranging from 50-70 (chronic afib, but seems to be more sporadic than usual).  O2 sats 80s-90s, also very variable, on home O2 2L which is new for patient. Per Tonye Becket NP-C advised RN to have patient report to ED for further workup. RN aware and verbalized back instructions/orders correctly.  [name], RN

## 2017-12-13 NOTE — ED Notes (Signed)
Attempted to call report to 6E.  No RN assigned to the room yet per Lupita Leash, Licensed conveyancer.

## 2017-12-13 NOTE — ED Triage Notes (Signed)
Pt has been experiencing increasing SOB for 4-5 days with a hx of COPD chronic O2 use.  Pt developed CP this AM across the chest.  EMS administered 2 sl NTG and relieved the pain.  Pt is anticoagulated with xarelto for A-fib.

## 2017-12-13 NOTE — ED Notes (Signed)
Patient transported to X-ray 

## 2017-12-13 NOTE — ED Notes (Signed)
Admitting provider at bedside for evaluation.

## 2017-12-13 NOTE — Progress Notes (Addendum)
RT placed pt on nasal cannula at this time. Pt unable to tolerate mask anymore at this point. Pt stated she is not having any trouble breathing. Pt and Pt  daughter stated that pt was at baseline at this time. RT to continue to monitor as needed.

## 2017-12-13 NOTE — ED Provider Notes (Signed)
[facility] EMERGENCY DEPARTMENT Provider Note   CSN: [id number] Arrival date & time: 12/13/17  1135     History   Chief Complaint Chief Complaint  Patient presents with  . Chest Pain    HPI [name] is a 77 y.o. 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  Patient states that after going to the grocery store on Thursday and exerting herself she has had increasing shortness of breath while at rest and also with minimal exertion.  She states she has tried her home albuterol inhaler x2 without any relief.  She states that when she awoke this morning around 9 AM she started having chest tightness/pain that went across her chest without radiation to the neck, jaw, back or shoulders was associated with nausea and shortness of breath.  Patient states that she was trying to make herself breakfast when the pain occurred.  It did not relieved by rest.  She was seen by EMS and received 2 nitroglycerin that relieved her pain.  She is currently pain-free.  She reports that her home health nurse came to see her and noted that her oxygen saturations were in the 80s and 90s.  She contacted the patient's cardiology office who recommended that she come here and consult cardiology for further evaluation.  She is unsure if this pain is similar to her prior MIs.  Pain is not recurred.  She denies any fever, chills, change in cough, hemoptysis, emesis or diarrhea.  No visual changes, numbness/tingling/weakness of the extremities.  Patient's last echocardiogram on 09/12/2017 shows EF of 55-60% with LVH, no regional wall motion abnormalities and G3DD.   HPI  Past Medical History:  Diagnosis Date  . Acute parotitis 01/05/2016  . Anxiety   .  Bacteremia, escherichia coli   . Bursitis, shoulder    bilateral  . CHF (congestive heart failure) (HCC)    chronic diasotlic CHF  . Complication of anesthesia    "have trouble getting me awake sometimes; been ok latelhy" (09/24/2015)  . COPD (chronic obstructive pulmonary disease) (HCC)   . Coronary heart disease 2001   s/p CABG  . Depression   . DJD (degenerative joint disease)   . GERD (gastroesophageal reflux disease)   . H/O hiatal hernia   . History of rheumatic heart disease    X 3-LAST TIME WHEN PATIENT WAS 77 YRS OLD  . Hyperlipidemia   . Hypertension   . OA (osteoarthritis)   . On home oxygen therapy    "2L; 24/7" (09/24/2015)  . PUD (peptic ulcer disease)   . Pulmonary HTN (HCC)    PASP 50-24mmHg by [id number]  . Pulmonary HTN (HCC)   . Sleep apnea    intolerant to CPAP  . Thyroid nodule   . Urinary incontinence   . Varicose veins     Patient Active Problem List   Diagnosis Date Noted  . Gait disturbance 11/21/2017  . Lung nodule 11/07/2016  . Mitral valve stenosis   . Rheumatic aortic valve disease   . Atrial fibrillation (HCC) 06/12/2016  . Absolute anemia   . Chronic respiratory failure with hypoxia (HCC) 04/20/2016  . Rheumatic mitral valve disease   . PFO (patent foramen ovale)   . Cardiomyopathy (HCC)   . Chronic atrial fibrillation (  HCC)   . Alcoholic cardiomyopathy (HCC)   . Aortic valve vegetation 09/24/2015  . Lumbar spine scoliosis 04/18/2015  . Mitral stenosis 02/10/2015  . Chronic diastolic heart failure (HCC) 05/10/2013  . Atrial flutter (HCC) 05/10/2013  . Chronic anticoagulation 05/10/2013  . HTN (hypertension) 05/10/2013  . Dyslipidemia, goal LDL below 70 05/10/2013  . Seasonal allergic rhinitis 04/11/2012  . DYSPNEA 03/14/2008  . Coronary atherosclerosis 02/27/2008  . COPD mixed type (HCC) 02/27/2008  . Pulmonary hypertension (HCC) 02/21/2008  . DIASTOLIC DYSFUNCTION 02/21/2008    Past Surgical History:  Procedure Laterality Date   . BILATERAL SALPINGOOPHORECTOMY    . CARDIAC CATHETERIZATION  02/11/2008, 01/30/2004, 06/14/2000  . CARDIAC CATHETERIZATION N/A 09/24/2015   Procedure: Right Heart Cath;  Surgeon: Laurey Morale, MD;  Location: [facility] INVASIVE CV LAB;  Service: Cardiovascular;  Laterality: N/A;  . CARDIAC CATHETERIZATION N/A 02/08/2016   Procedure: Right/Left Heart Cath and Coronary/Graft Angiography;  Surgeon: Laurey Morale, MD;  Location: [facility] INVASIVE CV LAB;  Service: Cardiovascular;  Laterality: N/A;  . CARDIOVERSION N/A 06/12/2016   Procedure: CARDIOVERSION;  Surgeon: Duke Salvia, MD;  Location: [facility] OR;  Service: Cardiovascular;  Laterality: N/A;  . CORONARY ARTERY BYPASS GRAFT  06/21/2000   x4  . INCONTINENCE SURGERY     "tacked"  . TEE WITHOUT CARDIOVERSION N/A 09/24/2015   Procedure: TRANSESOPHAGEAL ECHOCARDIOGRAM (TEE);  Surgeon: Laurey Morale, MD;  Location: [facility] ENDOSCOPY;  Service: Cardiovascular;  Laterality: N/A;  . THYROIDECTOMY, PARTIAL  2010   Dr Michaell Cowing  . TOTAL ABDOMINAL HYSTERECTOMY  1990   1990  . TUBAL LIGATION    . VAGINAL DELIVERY  x4     OB History    Gravida  4   Para  4   Term      Preterm      AB      Living        SAB      TAB      Ectopic      Multiple      Live Births               Home Medications    Prior to Admission medications   Medication Sig Start Date End Date Taking? Authorizing Provider  acetaminophen (TYLENOL) 500 MG tablet Take 1,000 mg by mouth 2 (two) times daily as needed for headache.     [provider]  albuterol (PROVENTIL) (2.5 MG/3ML) 0.083% nebulizer solution Take 3 mLs (2.5 mg total) by nebulization every 6 (six) hours as needed for wheezing or shortness of breath. Patient not taking: Reported on 11/29/2017 10/16/17   Waymon Budge, MD  amiodarone (PACERONE) 200 MG tablet Take 1 tablet (200 mg total) by mouth daily. 12/11/17   Laurey Morale, MD  amLODipine (NORVASC) 2.5 MG tablet TAKE 1 TABLET BY MOUTH  DAILY 01/12/17    Laurey Morale, MD  benzonatate (TESSALON) 200 MG capsule Take 1 capsule (200 mg total) by mouth 3 (three) times daily as needed for cough. 08/26/16   Clegg, Amy D, NP  budesonide-formoterol (SYMBICORT) 80-4.5 MCG/ACT inhaler Inhale 2 puffs into the lungs 2 (two) times daily. 11/29/17   Parrett, Virgel Bouquet, NP  budesonide-formoterol (SYMBICORT) 80-4.5 MCG/ACT inhaler Inhale 2 puffs into the lungs 2 (two) times daily. 11/29/17   Parrett, Virgel Bouquet, NP  calcium-vitamin D (OSCAL WITH D) 500-200 MG-UNIT per tablet Take 2 tablets by mouth daily with breakfast.    [provider]  diclofenac  sodium (VOLTAREN) 1 % GEL Apply 2 g topically 4 (four) times daily. 10/20/17   Melene Plan, DO  ipratropium (ATROVENT) 0.02 % nebulizer solution USE 1 VIAL VIA NEBULIZER  EVERY 6 HOURS AS NEEDED FOR WHEEZING OR SHORTNESS OF  BREATH Patient not taking: Reported on 11/29/2017 07/17/17   Parrett, Virgel Bouquet, NP  IRON PO Take 1 tablet by mouth daily.    [provider]  isosorbide mononitrate (IMDUR) 60 MG 24 hr tablet Take 1 tablet (60 mg total) daily by mouth. 05/30/17   Laurey Morale, MD  loratadine (CLARITIN) 10 MG tablet Take 10 mg by mouth at bedtime as needed for allergies.     [provider]  metoprolol tartrate (LOPRESSOR) 50 MG tablet Take 1 tablet (50 mg total) by mouth 2 (two) times daily. 09/01/17   Laurey Morale, MD  NITROSTAT 0.4 MG SL tablet place 1 tablet under the tongue if needed every 5 minutes for chest pain for 3 doses IF NO RELIEF AFTER 3RD DOSE CALL PRESCRIBER OR 911. 01/21/16   Quintella Reichert, MD  omeprazole (PRILOSEC OTC) 20 MG tablet Take 40 mg by mouth daily.     [provider]  OXYGEN Inhale 2-3 L into the lungs continuous.     [provider]  potassium chloride SA (K-DUR,KLOR-CON) 20 MEQ tablet TAKE 2 TABLETS BY MOUTH TWO TIMES DAILY 11/28/16   Bensimhon, Bevelyn Buckles, MD  Rivaroxaban (XARELTO) 15 MG TABS tablet Take 1 tablet (15 mg total) by mouth daily with  supper. 10/26/17   Laurey Morale, MD  simvastatin (ZOCOR) 20 MG tablet TAKE 3 TABLETS BY MOUTH  DAILY AT 6 PM Patient not taking: Reported on 11/29/2017 05/30/17   Laurey Morale, MD  spironolactone (ALDACTONE) 25 MG tablet TAKE 1 TABLET BY MOUTH  DAILY 05/30/17   Laurey Morale, MD  torsemide (DEMADEX) 20 MG tablet Take 3 tablets (60 mg total) by mouth 2 (two) times daily. 09/26/17   Graciella Freer, PA-C    Family History Family History  Problem Relation Age of Onset  . Anemia Mother   . Emphysema Father   . Esophageal cancer Daughter     Social History Social History   Tobacco Use  . Smoking status: Former Smoker    Packs/day: 1.00    Years: 47.00    Pack years: 47.00    Types: Cigarettes    Last attempt to quit: 07/26/2007    Years since quitting: 10.3  . Smokeless tobacco: Never Used  Substance Use Topics  . Alcohol use: No  . Drug use: No     Allergies   Lisinopril; Prednisone; Tape; Xopenex [levalbuterol]; Cefpodoxime; Codeine; Lipitor [atorvastatin]; Other; Penicillins; Ventolin [kdc:albuterol]; Anoro ellipta [umeclidinium-vilanterol]; Clindamycin; Hydrocodone-acetaminophen; Latex; and Levalbuterol hcl   Review of Systems Review of Systems  All other systems reviewed and are negative.    Physical Exam Updated Vital Signs BP (!) 147/93 (BP Location: Right Arm)   Pulse 92   Temp 98.6 F (37 C) (Oral)   Resp (!) 25   Ht 5\' 6"  (1.676 m)   Wt 87.3 kg (192 lb 6.4 oz)   SpO2 100%   BMI 31.05 kg/m   Physical Exam  Constitutional: She appears well-developed and well-nourished.  HENT:  Head: Normocephalic and atraumatic.  Right Ear: External ear normal.  Left Ear: External ear normal.  Nose: Nose normal.  Mouth/Throat: Uvula is midline, oropharynx is clear and moist and mucous membranes are normal. No  tonsillar exudate.  Eyes: Pupils are equal, round, and reactive to light. Right eye exhibits no discharge. Left eye exhibits no discharge. No scleral  icterus.  Neck: Trachea normal. Neck supple. No JVD present. No spinous process tenderness present. Carotid bruit is not present. No neck rigidity. Normal range of motion present.  Cardiovascular: Normal rate, regular rhythm and intact distal pulses.  No murmur heard. Pulses:      Radial pulses are 2+ on the right side, and 2+ on the left side.       Dorsalis pedis pulses are 2+ on the right side, and 2+ on the left side.       Posterior tibial pulses are 2+ on the right side, and 2+ on the left side.  No lower extremity swelling or edema. Calves symmetric in size bilaterally.  Pulmonary/Chest: No accessory muscle usage. Tachypnea noted. No respiratory distress. She has decreased breath sounds. She has wheezes in the left lower field. She exhibits no tenderness.  Patient becomes mildly short of breath after speaking in full sentences.  No pursed lip breathing or accessory muscle use.  Patient is currently satting 100% on baseline 2 L of O2.  Abdominal: Soft. Bowel sounds are normal. There is no tenderness. There is no rebound and no guarding.  Musculoskeletal: She exhibits no edema.  Lymphadenopathy:    She has no cervical adenopathy.  Neurological: She is alert.  Moves all 4 extremities without difficulty or ataxia.  Equal strength for upper and lower extremities bilaterally.  Normal sensation all 4 extremities.  Skin: Skin is warm and dry. Capillary refill takes less than 2 seconds. No rash noted. She is not diaphoretic.  Psychiatric: She has a normal mood and affect.  Nursing note and vitals reviewed.    ED Treatments / Results  Labs (all labs ordered are listed, but only abnormal results are displayed) Labs Reviewed  BASIC METABOLIC PANEL - Abnormal; Notable for the following components:      Result Value   Chloride 93 (*)    CO2 37 (*)    Glucose, Bld 150 (*)    Creatinine, Ser 1.28 (*)    GFR calc non Af Amer 39 (*)    GFR calc Af Amer 45 (*)    All other components within  normal limits  CBC - Abnormal; Notable for the following components:   RBC 3.63 (*)    Hemoglobin 10.2 (*)    HCT 34.0 (*)    All other components within normal limits  BRAIN NATRIURETIC PEPTIDE - Abnormal; Notable for the following components:   B Natriuretic Peptide 145.7 (*)    All other components within normal limits  I-STAT TROPONIN, ED    EKG EKG Interpretation  Date/Time:  Wednesday Dec 13 2017 11:47:56 EDT Ventricular Rate:  92 PR Interval:    QRS Duration: 157 QT Interval:  428 QTC Calculation: 530 R Axis:   -84 Text Interpretation:  Sinus rhythm Short PR interval RBBB and LAFB Probable left ventricular hypertrophy Lateral infarct, acute no change from previous.  Confirmed by Arby Barrette [phone]) on 12/13/2017 11:59:24 AM   Radiology Dg Chest 2 View  Result Date: 12/13/2017 CLINICAL DATA:  Shortness of breath.  Weakness. EXAM: CHEST - 2 VIEW COMPARISON:  Chest CT 11/01/2017.  Chest x-ray 10/19/2017. FINDINGS: AP and lateral views of the chest show cardiomegaly. Stable asymmetric elevation left hemidiaphragm. Mild left base collapse/consolidation is stable since prior studies and may reflect scarring. The visualized bony structures of the thorax  are intact. Patient is status post median sternotomy. Telemetry leads overlie the chest. IMPRESSION: Stable exam without acute cardiopulmonary findings. Opacity at the left base is similar to prior studies and may reflect chronic atelectasis or scarring. Multiple tiny pulmonary nodules seen on previous chest CT not evident by x-ray today. Electronically Signed   By: Kennith Center M.D.   On: 12/13/2017 13:32    Procedures Procedures (including critical care time)  Medications Ordered in ED Medications  acetaminophen (TYLENOL) tablet 1,000 mg (has no administration in time range)  amiodarone (PACERONE) tablet 200 mg (has no administration in time range)  amLODipine (NORVASC) tablet 2.5 mg (has no administration in time range)    benzonatate (TESSALON) capsule 200 mg (has no administration in time range)  calcium-vitamin D (OSCAL WITH D) 500-200 MG-UNIT per tablet 2 tablet (has no administration in time range)  diclofenac sodium (VOLTAREN) 1 % transdermal gel 2 g (has no administration in time range)  isosorbide mononitrate (IMDUR) 24 hr tablet 60 mg (has no administration in time range)  loratadine (CLARITIN) tablet 10 mg (has no administration in time range)  metoprolol tartrate (LOPRESSOR) tablet 50 mg (has no administration in time range)  nitroGLYCERIN (NITROSTAT) SL tablet 0.4 mg (has no administration in time range)  omeprazole (PRILOSEC OTC) EC tablet 40 mg (has no administration in time range)  Rivaroxaban (XARELTO) tablet 15 mg (has no administration in time range)  simvastatin (ZOCOR) tablet 60 mg (has no administration in time range)  spironolactone (ALDACTONE) tablet 25 mg (has no administration in time range)  torsemide (DEMADEX) tablet 60 mg (has no administration in time range)  traZODone (DESYREL) tablet 25 mg (has no administration in time range)  ondansetron (ZOFRAN) tablet 4 mg (has no administration in time range)    Or  ondansetron (ZOFRAN) injection 4 mg (has no administration in time range)  bisacodyl (DULCOLAX) EC tablet 5 mg (has no administration in time range)  ipratropium-albuterol (DUONEB) 0.5-2.5 (3) MG/3ML nebulizer solution 3 mL (has no administration in time range)  albuterol (PROVENTIL) (2.5 MG/3ML) 0.083% nebulizer solution 5 mg (5 mg Nebulization Given 12/13/17 1425)  aspirin chewable tablet 324 mg (324 mg Oral Given 12/13/17 1559)  methylPREDNISolone sodium succinate (SOLU-MEDROL) 125 mg/2 mL injection 125 mg (125 mg Intravenous Given 12/13/17 1558)  ipratropium-albuterol (DUONEB) 0.5-2.5 (3) MG/3ML nebulizer solution 3 mL (3 mLs Nebulization Given 12/13/17 1607)  morphine 4 MG/ML injection 2 mg (2 mg Intravenous Given 12/13/17 1607)     Initial Impression / Assessment and Plan /  ED Course  I have reviewed the triage vital signs and the nursing notes.  Pertinent labs & imaging results that were available during my care of the patient were reviewed by me and considered in my medical decision making (see chart for details).     77 y.o. 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   She is shortness of breath is been ongoing and worsening since Thursday.  She notes she has been using albuterol inhaler at home without any relief.  She is noncompliant with her Symbicort.  Patient is followed by pulmonology.  Patient is on baseline 2 L O2 but was reported to have episodes of hypoxia by home health nurse today.  She is notably short of breath with minimal speaking.  Patient appears to have decreased breath sounds with mild expiratory wheezing on  exam.  She is protecting her airway. No lower extremity edema. Will give albuterol nebulizer and steroids.  Patient does have multiple allergies.  Patient's lab work reassuring.  No leukocytosis.  BNP within normal limits.  Chest x-ray without evidence of pneumonia, pulmonary edema, cardiomegaly, pleural effusions or other acute findings.  There is stable findings as noted above.  Patient continued to be short of breath despite therapy.  After discussion with my attending, Dr. Clarice Pole, feel the patient will need admission for COPD exacerbation.  Patient also did have an episode of chest pain that began this morning with history of shortness of breath and nausea.  Unsure if the shortness of breath was from baseline shortness of breath patient has been expensing since Thursday.  Patient did not exert herself after onset of pain.  He was relieved by nitroglycerin.  Pain did not radiate.  She does have past history of CABG as well as stenting.  Last heart catheterization 2 years ago.  Given patient's age and  risk factors will consult cardiology.  I spoke with Trish who agrees to have cardiology see the patient. She is currently chest pain free. EKG without any acute changes. No STEMI. Tn wnl. Dr. Allyson Sabal of caridology has cleared the patient for a cardiac standpoint with reassuring EKG, negative Tn, and reassuring cath 2 years ago.   Patient is followed by Deboraha Sprang, will consult [facility] for COPD excerebration.   Appreciate Dr. Ophelia Charter for bringing the patient in to the hospital. She appears safe for admission.   Final Clinical Impressions(s) / ED Diagnoses   Final diagnoses:  COPD exacerbation (HCC)  Shortness of breath  Other chest pain    ED Discharge Orders    None       Princella Pellegrini 12/13/17 1637    Arby Barrette, MD 12/17/17 [phone]

## 2017-12-13 NOTE — H&P (Signed)
History and Physical    ABERDEEN HAFEN ONG:[id number] DOB: 29-Sep-1940 DOA: 12/13/2017  PCP: Maurice Small, MD Patient coming from: home  Chief Complaint: worsening shortness of breath  HPI: [name] is a very pleasant 77 y.o. female with medical history significant for chronic respiratory failure, atrial fibrillation on anticoagulation, chronic diastolic heart failure, hypertension, mitral valve stenosis,CAD,pulmonary hypertension, rheumatic aortic valve disease, presents to the emergency Department chief complaint worsening shortness of breath.Initial evaluation reveals acute on chronic respiratory failure likely related to COPD exacerbation.  Information is obtained from the patient and the daughter who is at the bedside. Patient reports she is on 2 L of home oxygen at rest and 3 L with activity and has been for the last several years. She states over the last couple of days her shortness of breath has worsened and she is more short of breath at rest than before. She has required 3-4 L of oxygen with activity. Associated symptoms include chest "tightness" left anterior nonradiating. She states she took a nitroglycerin and the pain resolved. She denies any nausea vomiting diaphoresis or lower extremity edema. She denies palpitations cough headache dizziness syncope or near-syncope. She denies dysuria hematuria frequency or urgency. She states she has a nebulizer machine at home but does not know how to use it. She also reports prescribed Symbicort but she "doesn't know how to do it".    ED Course: emergency department she is afebrile hemodynamically stable with mild tachycardia and tachypnea. Oxygen saturation level greater than 90% on 3 L nasal cannula.She is provided with nebulizer 225 mg of Solu-Medrol.  Review of Systems: As per HPI otherwise all other systems reviewed and are negative.   Ambulatory Status: ambulates with a walker. No recent falls is at home with her husband who has no  legs  Past Medical History:  Diagnosis Date  . Acute parotitis 01/05/2016  . Anxiety   . Bacteremia, escherichia coli   . Bursitis, shoulder    bilateral  . CHF (congestive heart failure) (HCC)    chronic diasotlic CHF  . Complication of anesthesia    "have trouble getting me awake sometimes; been ok latelhy" (09/24/2015)  . COPD (chronic obstructive pulmonary disease) (HCC)   . Coronary heart disease 2001   s/p CABG  . Depression   . DJD (degenerative joint disease)   . GERD (gastroesophageal reflux disease)   . H/O hiatal hernia   . History of rheumatic heart disease    X 3-LAST TIME WHEN PATIENT WAS 77 YRS OLD  . Hyperlipidemia   . Hypertension   . OA (osteoarthritis)   . On home oxygen therapy    "2L; 24/7" (09/24/2015)  . PUD (peptic ulcer disease)   . Pulmonary HTN (HCC)    PASP 50-65mmHg by [id number]  . Pulmonary HTN (HCC)   . Sleep apnea    intolerant to CPAP  . Thyroid nodule   . Urinary incontinence   . Varicose veins     Past Surgical History:  Procedure Laterality Date  . BILATERAL SALPINGOOPHORECTOMY    . CARDIAC CATHETERIZATION  02/11/2008, 01/30/2004, 06/14/2000  . CARDIAC CATHETERIZATION N/A 09/24/2015   Procedure: Right Heart Cath;  Surgeon: Laurey Morale, MD;  Location: [facility] INVASIVE CV LAB;  Service: Cardiovascular;  Laterality: N/A;  . CARDIAC CATHETERIZATION N/A 02/08/2016   Procedure: Right/Left Heart Cath and Coronary/Graft Angiography;  Surgeon: Laurey Morale, MD;  Location: [facility] INVASIVE CV LAB;  Service: Cardiovascular;  Laterality: N/A;  . CARDIOVERSION N/A  06/12/2016   Procedure: CARDIOVERSION;  Surgeon: Duke Salvia, MD;  Location: [facility] OR;  Service: Cardiovascular;  Laterality: N/A;  . CORONARY ARTERY BYPASS GRAFT  06/21/2000   x4  . INCONTINENCE SURGERY     "tacked"  . TEE WITHOUT CARDIOVERSION N/A 09/24/2015   Procedure: TRANSESOPHAGEAL ECHOCARDIOGRAM (TEE);  Surgeon: Laurey Morale, MD;  Location: [facility] ENDOSCOPY;  Service: Cardiovascular;   Laterality: N/A;  . THYROIDECTOMY, PARTIAL  2010   Dr Michaell Cowing  . TOTAL ABDOMINAL HYSTERECTOMY  1990   1990  . TUBAL LIGATION    . VAGINAL DELIVERY  x4    Social History   Socioeconomic History  . Marital status: Married    Spouse name: Not on file  . Number of children: Not on file  . Years of education: Not on file  . Highest education level: Not on file  Occupational History  . Not on file  Social Needs  . Financial resource strain: Not on file  . Food insecurity:    Worry: Not on file    Inability: Not on file  . Transportation needs:    Medical: Not on file    Non-medical: Not on file  Tobacco Use  . Smoking status: Former Smoker    Packs/day: 1.00    Years: 47.00    Pack years: 47.00    Types: Cigarettes    Last attempt to quit: 07/26/2007    Years since quitting: 10.3  . Smokeless tobacco: Never Used  Substance and Sexual Activity  . Alcohol use: No  . Drug use: No  . Sexual activity: Not Currently  Lifestyle  . Physical activity:    Days per week: Not on file    Minutes per session: Not on file  . Stress: Not on file  Relationships  . Social connections:    Talks on phone: Not on file    Gets together: Not on file    Attends religious service: Not on file    Active member of club or organization: Not on file    Attends meetings of clubs or organizations: Not on file    Relationship status: Not on file  . Intimate partner violence:    Fear of current or ex partner: Not on file    Emotionally abused: Not on file    Physically abused: Not on file    Forced sexual activity: Not on file  Other Topics Concern  . Not on file  Social History Narrative   Husband bilateral left amputee for atheorsclerotic disease    Allergies  Allergen Reactions  . Lisinopril Cough    Developed persistent dry cough for a month.   . Prednisone Rash    jittery  . Tape Other (See Comments)    Bleeding  . Xopenex [Levalbuterol] Other (See Comments)    Made mouth and  throat sore  . Cefpodoxime Other (See Comments)    Yeast infections   . Codeine Other (See Comments)    Anxious/ jittery  . Lipitor [Atorvastatin] Other (See Comments)    Made joint start hurting/ Muscle aches  . Other Swelling    Venholin HFA  . Penicillins Other (See Comments)    Has patient had a PCN reaction causing immediate rash, facial/tongue/throat swelling, SOB or lightheadedness with hypotension: unknown, childhood reaction Has patient had a PCN reaction causing severe rash involving mucus membranes or skin necrosis: No Has patient had a PCN reaction that required hospitalization No Has patient had a PCN reaction occurring within  the last 10 years: No If all of the above answers are "NO", then may proceed with Cephalosporin use.   Terald Sleeper [Kdc:Albuterol] Other (See Comments)    "jittery"  . Anoro Ellipta [Umeclidinium-Vilanterol] Itching  . Clindamycin Other (See Comments)    Unknown   . Hydrocodone-Acetaminophen Anxiety  . Latex Itching and Rash  . Levalbuterol Hcl Other (See Comments)    Unknown     Family History  Problem Relation Age of Onset  . Anemia Mother   . Emphysema Father   . Esophageal cancer Daughter     Prior to Admission medications   Medication Sig Start Date End Date Taking? Authorizing Provider  acetaminophen (TYLENOL) 500 MG tablet Take 1,000 mg by mouth 2 (two) times daily as needed for headache.    Yes [provider]  amiodarone (PACERONE) 200 MG tablet Take 1 tablet (200 mg total) by mouth daily. 12/11/17  Yes Laurey Morale, MD  amLODipine (NORVASC) 2.5 MG tablet TAKE 1 TABLET BY MOUTH  DAILY 01/12/17  Yes Laurey Morale, MD  benzonatate (TESSALON) 200 MG capsule Take 1 capsule (200 mg total) by mouth 3 (three) times daily as needed for cough. 08/26/16  Yes Clegg, Amy D, NP  calcium-vitamin D (OSCAL WITH D) 500-200 MG-UNIT per tablet Take 2 tablets by mouth daily with breakfast.   Yes [provider]  diclofenac sodium  (VOLTAREN) 1 % GEL Apply 2 g topically 4 (four) times daily. 10/20/17  Yes Melene Plan, DO  IRON PO Take 1 tablet by mouth daily as needed (supplement).    Yes [provider]  isosorbide mononitrate (IMDUR) 60 MG 24 hr tablet Take 1 tablet (60 mg total) daily by mouth. 05/30/17  Yes Laurey Morale, MD  loratadine (CLARITIN) 10 MG tablet Take 10 mg by mouth at bedtime as needed for allergies.    Yes [provider]  metoprolol tartrate (LOPRESSOR) 50 MG tablet Take 1 tablet (50 mg total) by mouth 2 (two) times daily. 09/01/17  Yes Laurey Morale, MD  NITROSTAT 0.4 MG SL tablet place 1 tablet under the tongue if needed every 5 minutes for chest pain for 3 doses IF NO RELIEF AFTER 3RD DOSE CALL PRESCRIBER OR 911. 01/21/16  Yes Turner, Cornelious Bryant, MD  omeprazole (PRILOSEC OTC) 20 MG tablet Take 40 mg by mouth daily.    Yes [provider]  omeprazole (PRILOSEC) 40 MG capsule Take 40 mg by mouth daily. 11/22/17  Yes [provider]  OXYGEN Inhale 2-3 L into the lungs continuous.    Yes [provider]  potassium chloride SA (K-DUR,KLOR-CON) 20 MEQ tablet TAKE 2 TABLETS BY MOUTH TWO TIMES DAILY 11/28/16  Yes Bensimhon, Bevelyn Buckles, MD  Rivaroxaban (XARELTO) 15 MG TABS tablet Take 1 tablet (15 mg total) by mouth daily with supper. 10/26/17  Yes Laurey Morale, MD  simvastatin (ZOCOR) 20 MG tablet TAKE 3 TABLETS BY MOUTH  DAILY AT 6 PM 05/30/17  Yes Laurey Morale, MD  spironolactone (ALDACTONE) 25 MG tablet TAKE 1 TABLET BY MOUTH  DAILY 05/30/17  Yes Laurey Morale, MD  torsemide (DEMADEX) 20 MG tablet Take 3 tablets (60 mg total) by mouth 2 (two) times daily. 09/26/17  Yes Graciella Freer, PA-C  albuterol (PROVENTIL) (2.5 MG/3ML) 0.083% nebulizer solution Take 3 mLs (2.5 mg total) by nebulization every 6 (six) hours as needed for wheezing or shortness of breath. Patient not taking: Reported on 11/29/2017 10/16/17   [place],  Rennis Chris, MD  budesonide-formoterol  (SYMBICORT) 80-4.5 MCG/ACT inhaler Inhale 2 puffs into the lungs 2 (two) times daily. Patient not taking: Reported on 12/13/2017 11/29/17   Parrett, Virgel Bouquet, NP  budesonide-formoterol (SYMBICORT) 80-4.5 MCG/ACT inhaler Inhale 2 puffs into the lungs 2 (two) times daily. Patient not taking: Reported on 12/13/2017 11/29/17   Parrett, Virgel Bouquet, NP  ipratropium (ATROVENT) 0.02 % nebulizer solution USE 1 VIAL VIA NEBULIZER  EVERY 6 HOURS AS NEEDED FOR WHEEZING OR SHORTNESS OF  BREATH Patient not taking: Reported on 11/29/2017 07/17/17   Julio Sicks, NP    Physical Exam: Vitals:   12/13/17 1715 12/13/17 1730 12/13/17 1745 12/13/17 1800  BP: 131/69 (!) 121/57 (!) 157/75 (!) 135/57  Pulse: (!) 106 (!) 104 (!) 106 (!) 114  Resp: (!) 23 (!) 25 (!) 35 (!) 24  Temp:      TempSrc:      SpO2: 97% 95% 93% 96%  Weight:      Height:         General:  Appears somewhat anxious lying in bed mild to moderate distress Eyes:  PERRL, EOMI, normal lids, iris ENT:  grossly normal hearing, lips & tongue,is membranes of her mouth are pink slightly dry Neck:  no LAD, masses or thyromegaly Cardiovascular:  Tachycardic but regular, no Lloyd/r/g. No LE edema.  Respiratory:  Moderate increased work of breathing with conversation. Sounds quite diminished throughout. Do hear a faint end-expiratory wheeze posteriorly next early. No crackles no rhonchi Abdomen:  soft, ntnd, obese positive bowel sounds throughout no guarding or rebounding Skin:  no rash or induration seen on limited exam Musculoskeletal:  grossly normal tone BUE/BLE, good ROM, no bony abnormality Psychiatric:  grossly normal mood and affect, speech fluent and appropriate, AOx3 Neurologic:  CN 2-12 grossly intact, moves all extremities in coordinated fashion, sensation intact speech clear facial symmetry  Labs on Admission: I have personally reviewed following labs and imaging studies  CBC: Recent Labs  Lab 12/13/17 1153  WBC 7.5  HGB 10.2*  HCT 34.0*    MCV 93.7  PLT 217   Basic Metabolic Panel: Recent Labs  Lab 12/13/17 1153  NA 141  K 3.8  CL 93*  CO2 37*  GLUCOSE 150*  BUN 15  CREATININE 1.28*  CALCIUM 9.5   GFR: Estimated Creatinine Clearance: 40.3 mL/min (A) (by C-G formula based on SCr of 1.28 mg/dL (H)). Liver Function Tests: No results for input(s): AST, ALT, ALKPHOS, BILITOT, PROT, ALBUMIN in the last 168 hours. No results for input(s): LIPASE, AMYLASE in the last 168 hours. No results for input(s): AMMONIA in the last 168 hours. Coagulation Profile: No results for input(s): INR, PROTIME in the last 168 hours. Cardiac Enzymes: Recent Labs  Lab 12/13/17 1557  TROPONINI <0.03   BNP (last 3 results) No results for input(s): PROBNP in the last 8760 hours. HbA1C: No results for input(s): HGBA1C in the last 72 hours. CBG: No results for input(s): GLUCAP in the last 168 hours. Lipid Profile: No results for input(s): CHOL, HDL, LDLCALC, TRIG, CHOLHDL, LDLDIRECT in the last 72 hours. Thyroid Function Tests: No results for input(s): TSH, T4TOTAL, FREET4, T3FREE, THYROIDAB in the last 72 hours. Anemia Panel: No results for input(s): VITAMINB12, FOLATE, FERRITIN, TIBC, IRON, RETICCTPCT in the last 72 hours. Urine analysis:    Component Value Date/Time   COLORURINE YELLOW 06/11/2016 1442   APPEARANCEUR CLEAR 06/11/2016 1442   LABSPEC 1.005 06/11/2016 1442   PHURINE 7.5 06/11/2016 1442  GLUCOSEU NEGATIVE 06/11/2016 1442   HGBUR NEGATIVE 06/11/2016 1442   BILIRUBINUR NEGATIVE 06/11/2016 1442   KETONESUR NEGATIVE 06/11/2016 1442   PROTEINUR NEGATIVE 06/11/2016 1442   NITRITE NEGATIVE 06/11/2016 1442   LEUKOCYTESUR NEGATIVE 06/11/2016 1442    Creatinine Clearance: Estimated Creatinine Clearance: 40.3 mL/min (A) (by C-G formula based on SCr of 1.28 mg/dL (H)).  Sepsis Labs: @LABRCNTIP (procalcitonin:4,lacticidven:4) )No results found for this or any previous visit (from the past 240 hour(s)).   Radiological  Exams on Admission: Dg Chest 2 View  Result Date: 12/13/2017 CLINICAL DATA:  Shortness of breath.  Weakness. EXAM: CHEST - 2 VIEW COMPARISON:  Chest CT 11/01/2017.  Chest x-ray 10/19/2017. FINDINGS: AP and lateral views of the chest show cardiomegaly. Stable asymmetric elevation left hemidiaphragm. Mild left base collapse/consolidation is stable since prior studies and may reflect scarring. The visualized bony structures of the thorax are intact. Patient is status post median sternotomy. Telemetry leads overlie the chest. IMPRESSION: Stable exam without acute cardiopulmonary findings. Opacity at the left base is similar to prior studies and may reflect chronic atelectasis or scarring. Multiple tiny pulmonary nodules seen on previous chest CT not evident by x-ray today. Electronically Signed   By: Kennith Center Lloyd.D.   On: 12/13/2017 13:32    EKG: Independently reviewed. Sinus rhythm Short PR interval RBBB and LAFB Probable left ventricular hypertrophy Lateral infarct, acute no change from previous.  Assessment/Plan Principal Problem:   Acute on chronic respiratory failure (HCC) Active Problems:   COPD exacerbation (HCC)   Coronary atherosclerosis   Chronic diastolic heart failure (HCC)   HTN (hypertension)   Cardiomyopathy (HCC)   Atrial fibrillation (HCC)   Mitral valve stenosis   Metabolic alkalosis   #1. Acute on chronic respiratory failure likely related to COPD exacerbation trigger unclear. Oxygen saturation level greater than 90% on 4 L nasal cannula. Baseline oxygen level to liters at rest 3 with activity. Xray of chest reveals stable acute cardiopulmonary findingsa pacer the at the left base similar to prior studies reflecting chronic atelectasis or scarring. BNP 145. initial troponin negative. EKG without acute changes. ABG with a pH of 7.50, PCO2 67, PO2 48, bicarbonate 37 On admission patient continues with increased work of breathing and tachypnea improved -Admit -bipap -repeat  blood gas -continue scheduled nebulizers -solu-Medrol -flutter valve -Obtain ABG for completeness -Monitor oxygen saturation level -Wean oxygen to baseline is able  #2. COPD exacerbation. Pulmonologist is [employer]. Home medications include ebulizers and Symbicort. See #1. -patient requesting nebulizer machine for home  #3. Metabolic alkalosis. Likely compensation for above. No metabolic dergangement.  -bipap -abg 2 hours after bipap  #4. Chronic diastolic heart failure/cardiomyopathy. Compensated. Echo done 3 months ago reveals an EF of 55%, mild LVH, Doppler parameters consistent with reversible restrictive pattern and grade 3 diastolic dysfunction. Home meds include , metoprolol, miodarone -Continue home meds -Monitor intake and output -Obtain daily weights  #5. A. Fib. [place] score 5. eKG as noted above. Home meds include xarelto -continue home meds  6. Hypertension. Fair control in the emergency department. Home medications include metoprolol, amiodarone, Norvasc, imdur -continue home meds -Monitor  #7.CAD/valve stenosis/chest pain. Likely related to #1.  Status post CABG. Initial troponin negative. EKG as noted above. -Monitor on telemetry -Continue home meds   DVT prophylaxis: xaerlto  Code Status: limited  Family Communication: daughter at bedside  Disposition Plan: home  Consults called: none   Admission status: obs    Monica Smothers M MD [facility]  If 7PM-7AM,  please contact night-coverage [email or web address] Password [facility]  12/13/2017, 6:15 PM

## 2017-12-14 DIAGNOSIS — I10 Essential (primary) hypertension: Secondary | ICD-10-CM

## 2017-12-14 DIAGNOSIS — I5032 Chronic diastolic (congestive) heart failure: Secondary | ICD-10-CM

## 2017-12-14 DIAGNOSIS — I480 Paroxysmal atrial fibrillation: Secondary | ICD-10-CM

## 2017-12-14 LAB — IRON AND TIBC
IRON: 26 ug/dL — AB (ref 28–170)
Saturation Ratios: 5 % — ABNORMAL LOW (ref 10.4–31.8)
TIBC: 476 ug/dL — ABNORMAL HIGH (ref 250–450)
UIBC: 450 ug/dL

## 2017-12-14 LAB — BASIC METABOLIC PANEL
ANION GAP: 12 (ref 5–15)
Anion gap: 11 (ref 5–15)
BUN: 20 mg/dL (ref 6–20)
BUN: 30 mg/dL — ABNORMAL HIGH (ref 6–20)
CALCIUM: 9.8 mg/dL (ref 8.9–10.3)
CO2: 36 mmol/L — ABNORMAL HIGH (ref 22–32)
CO2: 36 mmol/L — ABNORMAL HIGH (ref 22–32)
Calcium: 9.5 mg/dL (ref 8.9–10.3)
Chloride: 92 mmol/L — ABNORMAL LOW (ref 101–111)
Chloride: 92 mmol/L — ABNORMAL LOW (ref 101–111)
Creatinine, Ser: 1.52 mg/dL — ABNORMAL HIGH (ref 0.44–1.00)
Creatinine, Ser: 1.93 mg/dL — ABNORMAL HIGH (ref 0.44–1.00)
GFR calc Af Amer: 37 mL/min — ABNORMAL LOW (ref >60)
GFR calc Af Amer: 28 mL/min — ABNORMAL LOW (ref >60)
GFR calc non Af Amer: 32 mL/min — ABNORMAL LOW (ref >60)
GFR, EST NON AFRICAN AMERICAN: 24 mL/min — AB (ref >60)
GLUCOSE: 135 mg/dL — AB (ref 65–99)
Glucose, Bld: 153 mg/dL — ABNORMAL HIGH (ref 65–99)
POTASSIUM: 4.2 mmol/L (ref 3.5–5.1)
Potassium: 4.1 mmol/L (ref 3.5–5.1)
Sodium: 139 mmol/L (ref 135–145)
Sodium: 140 mmol/L (ref 135–145)

## 2017-12-14 LAB — CBC
HEMATOCRIT: 31.4 % — AB (ref 36.0–46.0)
HEMOGLOBIN: 9.5 g/dL — AB (ref 12.0–15.0)
MCH: 27.5 pg (ref 26.0–34.0)
MCHC: 30.3 g/dL (ref 30.0–36.0)
MCV: 90.8 fL (ref 78.0–100.0)
Platelets: 214 10*3/uL (ref 150–400)
RBC: 3.46 MIL/uL — AB (ref 3.87–5.11)
RDW: 13.3 % (ref 11.5–15.5)
WBC: 9.3 10*3/uL (ref 4.0–10.5)

## 2017-12-14 LAB — RETICULOCYTES
RBC.: 3.59 MIL/uL — AB (ref 3.87–5.11)
RETIC COUNT ABSOLUTE: 71.8 10*3/uL (ref 19.0–186.0)
Retic Ct Pct: 2 % (ref 0.4–3.1)

## 2017-12-14 LAB — VITAMIN B12: Vitamin B-12: 243 pg/mL (ref 180–914)

## 2017-12-14 LAB — D-DIMER, QUANTITATIVE (NOT AT ARMC): D DIMER QUANT: 0.36 ug{FEU}/mL (ref 0.00–0.50)

## 2017-12-14 LAB — MRSA PCR SCREENING: MRSA BY PCR: NEGATIVE

## 2017-12-14 LAB — FERRITIN: FERRITIN: 20 ng/mL (ref 11–307)

## 2017-12-14 LAB — FOLATE: Folate: 12.4 ng/mL (ref >5.9)

## 2017-12-14 LAB — TROPONIN I: TROPONIN I: 0.03 ng/mL — AB (ref <0.03)

## 2017-12-14 MED ORDER — BUDESONIDE 0.25 MG/2ML IN SUSP
0.2500 mg | Freq: Once | RESPIRATORY_TRACT | Status: AC
  Administered 2017-12-14: 0.25 mg via RESPIRATORY_TRACT
  Filled 2017-12-14: qty 2

## 2017-12-14 MED ORDER — ORAL CARE MOUTH RINSE
15.0000 mL | Freq: Two times a day (BID) | OROMUCOSAL | Status: DC
  Administered 2017-12-14 – 2017-12-26 (×22): 15 mL via OROMUCOSAL

## 2017-12-14 MED ORDER — IPRATROPIUM-ALBUTEROL 0.5-2.5 (3) MG/3ML IN SOLN
3.0000 mL | Freq: Three times a day (TID) | RESPIRATORY_TRACT | Status: DC
  Administered 2017-12-14 – 2017-12-21 (×21): 3 mL via RESPIRATORY_TRACT
  Filled 2017-12-14 (×22): qty 3

## 2017-12-14 NOTE — Progress Notes (Signed)
PROGRESS NOTE    [name]  ZOX:[id number] DOB: May 12, 1941 DOA: 12/13/2017 PCP: Maurice Small, MD   Brief Narrative:  77 y.o. WF PMHx Anxiety, depression, COPD on 2 L O2 via [place] at rest/3 L O2 with exertion,, OSA, A. fib on anticoagulation Chronic Diastolic CHF, HTN, MV stenosis, CAD, pulmonary HTN, S/P CABG rheumatic AV disease, Hiatal Hernia, HLD  Presents to the emergency Department chief complaint worsening shortness of breath.Initial evaluation reveals acute on chronic respiratory failure likely related to COPD exacerbation.   Information is obtained from the patient and the daughter who is at the bedside. Patient reports she is on 2 L of home oxygen at rest and 3 L with activity and has been for the last several years. She states over the last couple of days her shortness of breath has worsened and she is more short of breath at rest than before. She has required 3-4 L of oxygen with activity. Associated symptoms include chest "tightness" left anterior nonradiating. She states she took a nitroglycerin and the pain resolved. She denies any nausea vomiting diaphoresis or lower extremity edema. She denies palpitations cough headache dizziness syncope or near-syncope. She denies dysuria hematuria frequency or urgency. She states she has a nebulizer machine at home but does not know how to use it. She also reports prescribed Symbicort but she "doesn't know how to do it".       ED Course: emergency department she is afebrile hemodynamically stable with mild tachycardia and tachypnea. Oxygen saturation level greater than 90% on 3 L nasal cannula.She is provided with nebulizer 225 mg of Solu-Medrol.    Subjective: 5/23 A/O x4+ acute on chronic S OB.  Negative CP, negative abdominal pain.  States prior to admission positive chest tightness.  Admits unable to properly use inhaler (has powdery taste in her mouth after inhalation).   Assessment & Plan:   Principal Problem:   Acute on  chronic respiratory failure (HCC) Active Problems:   Coronary atherosclerosis   COPD exacerbation (HCC)   Chronic diastolic heart failure (HCC)   HTN (hypertension)   Cardiomyopathy (HCC)   Atrial fibrillation (HCC)   Mitral valve stenosis   Metabolic alkalosis  Acute on Chronic respiratory failure with hypoxia - BNP= 145 - Cardiac enzymes pending - D-dimer pending - PCXR: Stable findings nondiagnostic for acute cause of increased O2 demand.  Chronic changes see results below - DuoNeb TID -Pulmicort once: On allergy list as having rash?  Will give patient one-time dose.  If patient tolerates will increase to BID -Solu-Medrol 60 mg  BID -Titrate O2 to maintain SPO2 89-93% -Flutter valve -Hold on antibiotics at this point not fully convinced infectious in nature. - Cultures pending   COPD exacerbation -Per patient not able to use home inhalers properly, may be causing/contributing to acute respiratory failure. -See respiratory failure   Metabolic Alkalosis  -Most likely cause diuretics.  We will also check magnesium - See CHF  Chronic diastolic CHF/cardiomyopathy - Echocardiogram 3 months ago: EF= 55% with grade 3 diastolic dysfunction -Strict in and out -Daily weight -Transfuse for hemoglobin<8 - amlodipine 2.5 mg daily - Imdur 60 mg daily - Metoprolol 50 mg BID -Spironolactone 25 mg daily (hold) -  Demadex 60 mg BID (hold)  Paroxysmal A. Fib(CHADSVASC=5) - Rate controlled -Currently NSR - See CHF - Xarelto  Essential HTN  -See CHF   CAD    DVT prophylaxis: Xarelto Code Status: Partial Family Communication: None Disposition Plan: TBD   Consultants:  None  Procedures/Significant Events:     I have personally reviewed and interpreted all radiology studies and my findings are as above.  VENTILATOR SETTINGS: BiPAP     Cultures 5/23 respiratory virus culture pending 5/23 pneumonia/Legionella urine antigen pending 5/23 sputum  pending    Antimicrobials: Anti-infectives (From admission, onward)   None       Devices    LINES / TUBES:      Continuous Infusions:   Objective: Vitals:   12/14/17 0056 12/14/17 0418 12/14/17 0551 12/14/17 0746  BP: (!) 144/63  (!) 144/63   Pulse: 99  75 73  Resp:    17  Temp: 98.4 F (36.9 C)  98.4 F (36.9 C) 98.1 F (36.7 C)  TempSrc: Oral  Oral Oral  SpO2: 95% 98% 94% 98%  Weight: 192 lb 7.4 oz (87.3 kg)     Height: 5\' 6"  (1.676 m)       Intake/Output Summary (Last 24 hours) at 12/14/2017 0825 Last data filed at 12/14/2017 1610 Gross per 24 hour  Intake 240 ml  Output 250 ml  Net -10 ml   Filed Weights   12/13/17 1138 12/14/17 0056  Weight: 192 lb 6.4 oz (87.3 kg) 192 lb 7.4 oz (87.3 kg)    Examination:  General: A/O x4, positive acute on chronic respiratory distress Eyes: negative scleral hemorrhage, negative anisocoria, negative icterus Lungs: diffuse poor air movement, without wheezes or crackles Cardiovascular: Regular rate and rhythm without murmur gallop or rub normal S1 and S2 Abdomen: negative abdominal pain, nondistended, positive soft, bowel sounds, no rebound, no ascites, no appreciable mass Extremities: No significant cyanosis, clubbing, or edema bilateral lower extremities Skin: Negative rashes, lesions, ulcers Psychiatric:  Negative depression, negative anxiety, negative fatigue, negative mania  Central nervous system:  Cranial nerves II through XII intact, tongue/uvula midline, all extremities muscle strength 5/5, sensation intact throughout, f negative dysarthria, negative expressive aphasia, negative receptive aphasia.  .     Data Reviewed: Care during the described time interval was provided by me .  I have reviewed this patient's available data, including medical history, events of note, physical examination, and all test results as part of my evaluation.   CBC: Recent Labs  Lab 12/13/17 1153 12/14/17 0333  WBC 7.5 9.3   HGB 10.2* 9.5*  HCT 34.0* 31.4*  MCV 93.7 90.8  PLT 217 214   Basic Metabolic Panel: Recent Labs  Lab 12/13/17 1153 12/14/17 0333  NA 141 139  K 3.8 4.2  CL 93* 92*  CO2 37* 36*  GLUCOSE 150* 153*  BUN 15 20  CREATININE 1.28* 1.52*  CALCIUM 9.5 9.5   GFR: Estimated Creatinine Clearance: 33.9 mL/min (A) (by C-G formula based on SCr of 1.52 mg/dL (H)). Liver Function Tests: No results for input(s): AST, ALT, ALKPHOS, BILITOT, PROT, ALBUMIN in the last 168 hours. No results for input(s): LIPASE, AMYLASE in the last 168 hours. No results for input(s): AMMONIA in the last 168 hours. Coagulation Profile: No results for input(s): INR, PROTIME in the last 168 hours. Cardiac Enzymes: Recent Labs  Lab 12/13/17 1557  TROPONINI <0.03   BNP (last 3 results) No results for input(s): PROBNP in the last 8760 hours. HbA1C: No results for input(s): HGBA1C in the last 72 hours. CBG: No results for input(s): GLUCAP in the last 168 hours. Lipid Profile: No results for input(s): CHOL, HDL, LDLCALC, TRIG, CHOLHDL, LDLDIRECT in the last 72 hours. Thyroid Function Tests: No results for input(s): TSH, T4TOTAL, FREET4, T3FREE, THYROIDAB in  the last 72 hours. Anemia Panel: No results for input(s): VITAMINB12, FOLATE, FERRITIN, TIBC, IRON, RETICCTPCT in the last 72 hours. Urine analysis:    Component Value Date/Time   COLORURINE YELLOW 06/11/2016 1442   APPEARANCEUR CLEAR 06/11/2016 1442   LABSPEC 1.005 06/11/2016 1442   PHURINE 7.5 06/11/2016 1442   GLUCOSEU NEGATIVE 06/11/2016 1442   HGBUR NEGATIVE 06/11/2016 1442   BILIRUBINUR NEGATIVE 06/11/2016 1442   KETONESUR NEGATIVE 06/11/2016 1442   PROTEINUR NEGATIVE 06/11/2016 1442   NITRITE NEGATIVE 06/11/2016 1442   LEUKOCYTESUR NEGATIVE 06/11/2016 1442   Sepsis Labs: @LABRCNTIP (procalcitonin:4,lacticidven:4)  )No results found for this or any previous visit (from the past 240 hour(s)).       Radiology Studies: Dg Chest 2  View  Result Date: 12/13/2017 CLINICAL DATA:  Shortness of breath.  Weakness. EXAM: CHEST - 2 VIEW COMPARISON:  Chest CT 11/01/2017.  Chest x-ray 10/19/2017. FINDINGS: AP and lateral views of the chest show cardiomegaly. Stable asymmetric elevation left hemidiaphragm. Mild left base collapse/consolidation is stable since prior studies and may reflect scarring. The visualized bony structures of the thorax are intact. Patient is status post median sternotomy. Telemetry leads overlie the chest. IMPRESSION: Stable exam without acute cardiopulmonary findings. Opacity at the left base is similar to prior studies and may reflect chronic atelectasis or scarring. Multiple tiny pulmonary nodules seen on previous chest CT not evident by x-ray today. Electronically Signed   By: Kennith Center M.D.   On: 12/13/2017 13:32        Scheduled Meds: . amiodarone  200 mg Oral Daily  . amLODipine  2.5 mg Oral Daily  . calcium-vitamin D  2 tablet Oral Q breakfast  . diclofenac sodium  2 g Topical TID AC & HS  . ipratropium-albuterol  3 mL Nebulization Q4H  . isosorbide mononitrate  60 mg Oral Daily  . mouth rinse  15 mL Mouth Rinse BID  . methylPREDNISolone (SOLU-MEDROL) injection  60 mg Intravenous Q12H  . metoprolol tartrate  50 mg Oral BID  . pantoprazole  80 mg Oral Daily  . Rivaroxaban  15 mg Oral Q supper  . simvastatin  60 mg Oral q1800  . spironolactone  25 mg Oral Daily  . torsemide  60 mg Oral BID   Continuous Infusions:   LOS: 1 day    Time spent: 40 minutes    WOODS, Roselind Messier, MD [facility] Pager [phone]   If 7PM-7AM, please contact night-coverage [email or web address] Password [email or web address] 12/14/2017, 8:25 AM

## 2017-12-14 NOTE — Progress Notes (Signed)
Patient had intermittent desaturations to 70's and 80's while talking on the phone and eating breakfast.  O2 increased to 3L.  Patient encouraged to eat slowly and to limit time on phone calls to prevent shortness of breath and low oxygen saturations.

## 2017-12-14 NOTE — Progress Notes (Signed)
Updated [facility]. Critical Troponin 0.03

## 2017-12-15 ENCOUNTER — Other Ambulatory Visit (HOSPITAL_COMMUNITY): Payer: Self-pay | Admitting: Student

## 2017-12-15 DIAGNOSIS — I428 Other cardiomyopathies: Secondary | ICD-10-CM

## 2017-12-15 LAB — RESPIRATORY PANEL BY PCR
ADENOVIRUS-RVPPCR: NOT DETECTED
Bordetella pertussis: NOT DETECTED
CORONAVIRUS HKU1-RVPPCR: NOT DETECTED
CORONAVIRUS NL63-RVPPCR: NOT DETECTED
Chlamydophila pneumoniae: NOT DETECTED
Coronavirus 229E: NOT DETECTED
Coronavirus OC43: NOT DETECTED
Influenza A: NOT DETECTED
Influenza B: NOT DETECTED
MYCOPLASMA PNEUMONIAE-RVPPCR: NOT DETECTED
Metapneumovirus: NOT DETECTED
Parainfluenza Virus 1: NOT DETECTED
Parainfluenza Virus 2: NOT DETECTED
Parainfluenza Virus 3: NOT DETECTED
Parainfluenza Virus 4: NOT DETECTED
Respiratory Syncytial Virus: NOT DETECTED
Rhinovirus / Enterovirus: NOT DETECTED

## 2017-12-15 LAB — CBC
HEMATOCRIT: 33.5 % — AB (ref 36.0–46.0)
Hemoglobin: 10 g/dL — ABNORMAL LOW (ref 12.0–15.0)
MCH: 27.8 pg (ref 26.0–34.0)
MCHC: 29.9 g/dL — ABNORMAL LOW (ref 30.0–36.0)
MCV: 93.1 fL (ref 78.0–100.0)
PLATELETS: 235 10*3/uL (ref 150–400)
RBC: 3.6 MIL/uL — ABNORMAL LOW (ref 3.87–5.11)
RDW: 13.2 % (ref 11.5–15.5)
WBC: 16.6 10*3/uL — AB (ref 4.0–10.5)

## 2017-12-15 LAB — BASIC METABOLIC PANEL
Anion gap: 14 (ref 5–15)
BUN: 36 mg/dL — ABNORMAL HIGH (ref 6–20)
CHLORIDE: 94 mmol/L — AB (ref 101–111)
CO2: 33 mmol/L — ABNORMAL HIGH (ref 22–32)
Calcium: 9.9 mg/dL (ref 8.9–10.3)
Creatinine, Ser: 1.38 mg/dL — ABNORMAL HIGH (ref 0.44–1.00)
GFR calc non Af Amer: 36 mL/min — ABNORMAL LOW (ref >60)
GFR, EST AFRICAN AMERICAN: 41 mL/min — AB (ref >60)
Glucose, Bld: 111 mg/dL — ABNORMAL HIGH (ref 65–99)
POTASSIUM: 4.2 mmol/L (ref 3.5–5.1)
SODIUM: 141 mmol/L (ref 135–145)

## 2017-12-15 LAB — TROPONIN I: Troponin I: 0.03 ng/mL (ref <0.03)

## 2017-12-15 LAB — MAGNESIUM: MAGNESIUM: 2.4 mg/dL (ref 1.7–2.4)

## 2017-12-15 LAB — STREP PNEUMONIAE URINARY ANTIGEN: Strep Pneumo Urinary Antigen: NEGATIVE

## 2017-12-15 MED ORDER — BUDESONIDE 0.25 MG/2ML IN SUSP
0.2500 mg | Freq: Two times a day (BID) | RESPIRATORY_TRACT | Status: DC
  Administered 2017-12-15 – 2017-12-26 (×20): 0.25 mg via RESPIRATORY_TRACT
  Filled 2017-12-15 (×23): qty 2

## 2017-12-15 NOTE — Progress Notes (Addendum)
PROGRESS NOTE    [name]  ZOX:[id number] DOB: 1941/02/03 DOA: 12/13/2017 PCP: Maurice Small, MD   Brief Narrative:  77 y.o. WF PMHx Anxiety, depression, COPD on 2 L O2 via [place] at rest/3 L O2 with exertion,, OSA, A. fib on anticoagulation Chronic Diastolic CHF, HTN, MV stenosis, CAD, pulmonary HTN, S/P CABG rheumatic AV disease, Hiatal Hernia, HLD  Presents to the emergency Department chief complaint worsening shortness of breath.Initial evaluation reveals acute on chronic respiratory failure likely related to COPD exacerbation.   Information is obtained from the patient and the daughter who is at the bedside. Patient reports she is on 2 L of home oxygen at rest and 3 L with activity and has been for the last several years. She states over the last couple of days her shortness of breath has worsened and she is more short of breath at rest than before. She has required 3-4 L of oxygen with activity. Associated symptoms include chest "tightness" left anterior nonradiating. She states she took a nitroglycerin and the pain resolved. She denies any nausea vomiting diaphoresis or lower extremity edema. She denies palpitations cough headache dizziness syncope or near-syncope. She denies dysuria hematuria frequency or urgency. She states she has a nebulizer machine at home but does not know how to use it. She also reports prescribed Symbicort but she "doesn't know how to do it".       ED Course: emergency department she is afebrile hemodynamically stable with mild tachycardia and tachypnea. Oxygen saturation level greater than 90% on 3 L nasal cannula.She is provided with nebulizer 225 mg of Solu-Medrol.    Subjective: 5/24 A/O x4, positive acute on chronic S OB (improved does not desat when carrying on conversation).  Negative CP.  Negative abdominal pain     Assessment & Plan:   Principal Problem:   Acute on chronic respiratory failure (HCC) Active Problems:   Coronary  atherosclerosis   COPD exacerbation (HCC)   Chronic diastolic heart failure (HCC)   HTN (hypertension)   Cardiomyopathy (HCC)   Atrial fibrillation (HCC)   Mitral valve stenosis   Metabolic alkalosis  Acute on Chronic respiratory failure with hypoxia - BNP= 145 - Cardiac enzymes: Barely positive secondary to demand. Recent Labs  Lab 12/13/17 1557 12/14/17 1852 12/15/17 0108 12/15/17 0702  TROPONINI <0.03 0.03* 0.03* <0.03  - D-dimer: Negative - PCXR: Stable findings nondiagnostic for acute cause of increased O2 demand.  Chronic changes see results below - DuoNeb TID -Pulmicort; BID sees tolerated without any side effects.)  -Solu-Medrol 60 mg  BID -Titrate O2 to maintain SPO2 89-93% -Flutter valve -Hold on antibiotics at this point not fully convinced infectious in nature. - Cultures: NGTD  COPD exacerbation -Per patient not able to use home inhalers properly, may be causing/contributing to acute respiratory failure. -Have requested home nebulizer machine. -See respiratory failure   Metabolic Alkalosis  -Most likely cause diuretics.  Resolving with discontinuation of diuretics.  - Magnesium WNL - See CHF  Chronic diastolic CHF/cardiomyopathy - Echocardiogram 3 months ago: EF= 55% with grade 3 diastolic dysfunction -Strict in and out since admission -1.0 L  -Daily weight Filed Weights   12/13/17 1138 12/14/17 0056 12/15/17 0447  Weight: 192 lb 6.4 oz (87.3 kg) 192 lb 7.4 oz (87.3 kg) 191 lb 12.8 oz (87 kg)  -Transfuse for hemoglobin<8 - amlodipine 2.5 mg daily - Imdur 60 mg daily - Metoprolol 50 mg BID -Spironolactone 25 mg daily (hold) -  Demadex 60 mg BID (hold)  Paroxysmal A. Fib(CHADSVASC=5) - Rate controlled -Currently NSR - See CHF - Xarelto  Essential HTN  -See CHF   CAD     Goals of care - 5/24 PT/OT consulted.  Patient frail/deconditioned evaluate for CIR vs SNF   DVT prophylaxis: Xarelto Code Status: Partial Family Communication:  None Disposition Plan:    Consultants:  None  Procedures/Significant Events:     I have personally reviewed and interpreted all radiology studies and my findings are as above.  VENTILATOR SETTINGS: BiPAP     Cultures 5/23 respiratory virus culture negative  5/23 urine strep pneumo negative 5/23 Legionella urine antigen pending 5/23 sputum pending 5/23 MRSA by PCR negative   Antimicrobials: Anti-infectives (From admission, onward)   None       Devices    LINES / TUBES:      Continuous Infusions:   Objective: Vitals:   12/14/17 2056 12/15/17 0009 12/15/17 0447 12/15/17 0728  BP:  (!) 129/44 (!) 131/52 (!) 153/63  Pulse: 76 75 73 65  Resp:  17 18 18   Temp:  97.8 F (36.6 C) 98 F (36.7 C) 98.5 F (36.9 C)  TempSrc:  Oral Oral Oral  SpO2:  93% 94% 96%  Weight:   191 lb 12.8 oz (87 kg)   Height:        Intake/Output Summary (Last 24 hours) at 12/15/2017 0750 Last data filed at 12/15/2017 0453 Gross per 24 hour  Intake 720 ml  Output 1650 ml  Net -930 ml   Filed Weights   12/13/17 1138 12/14/17 0056 12/15/17 0447  Weight: 192 lb 6.4 oz (87.3 kg) 192 lb 7.4 oz (87.3 kg) 191 lb 12.8 oz (87 kg)    Physical Exam:  General: A/O x4, positive acute on chronic respiratory distress (improved) Neck:  Negative scars, masses, torticollis, lymphadenopathy, JVD Lungs: diffuse poor air movement but improving, negative wheezes or crackles Cardiovascular: Regular rate and rhythm without murmur gallop or rub normal S1 and S2 Abdomen: negative abdominal pain, nondistended, positive soft, bowel sounds, no rebound, no ascites, no appreciable mass Extremities: No significant cyanosis, clubbing, or edema bilateral lower extremities Skin: Negative rashes, lesions, ulcers Psychiatric:  Negative depression, negative anxiety, negative fatigue, negative mania  Central nervous system:  Cranial nerves II through XII intact, tongue/uvula midline, all extremities muscle  strength 5/5, sensation intact throughout,  negative dysarthria, negative expressive aphasia, negative receptive aphasia. .     Data Reviewed: Care during the described time interval was provided by me .  I have reviewed this patient's available data, including medical history, events of note, physical examination, and all test results as part of my evaluation.   CBC: Recent Labs  Lab 12/13/17 1153 12/14/17 0333  WBC 7.5 9.3  HGB 10.2* 9.5*  HCT 34.0* 31.4*  MCV 93.7 90.8  PLT 217 214   Basic Metabolic Panel: Recent Labs  Lab 12/13/17 1153 12/14/17 0333 12/14/17 1852  NA 141 139 140  K 3.8 4.2 4.1  CL 93* 92* 92*  CO2 37* 36* 36*  GLUCOSE 150* 153* 135*  BUN 15 20 30*  CREATININE 1.28* 1.52* 1.93*  CALCIUM 9.5 9.5 9.8   GFR: Estimated Creatinine Clearance: 26.7 mL/min (A) (by C-G formula based on SCr of 1.93 mg/dL (H)). Liver Function Tests: No results for input(s): AST, ALT, ALKPHOS, BILITOT, PROT, ALBUMIN in the last 168 hours. No results for input(s): LIPASE, AMYLASE in the last 168 hours. No results for input(s): AMMONIA in the last 168 hours. Coagulation Profile:  No results for input(s): INR, PROTIME in the last 168 hours. Cardiac Enzymes: Recent Labs  Lab 12/13/17 1557 12/14/17 1852 12/15/17 0108  TROPONINI <0.03 0.03* 0.03*   BNP (last 3 results) No results for input(s): PROBNP in the last 8760 hours. HbA1C: No results for input(s): HGBA1C in the last 72 hours. CBG: No results for input(s): GLUCAP in the last 168 hours. Lipid Profile: No results for input(s): CHOL, HDL, LDLCALC, TRIG, CHOLHDL, LDLDIRECT in the last 72 hours. Thyroid Function Tests: No results for input(s): TSH, T4TOTAL, FREET4, T3FREE, THYROIDAB in the last 72 hours. Anemia Panel: Recent Labs    12/14/17 1852  VITAMINB12 243  FOLATE 12.4  FERRITIN 20  TIBC 476*  IRON 26*  RETICCTPCT 2.0   Urine analysis:    Component Value Date/Time   COLORURINE YELLOW 06/11/2016 1442    APPEARANCEUR CLEAR 06/11/2016 1442   LABSPEC 1.005 06/11/2016 1442   PHURINE 7.5 06/11/2016 1442   GLUCOSEU NEGATIVE 06/11/2016 1442   HGBUR NEGATIVE 06/11/2016 1442   BILIRUBINUR NEGATIVE 06/11/2016 1442   KETONESUR NEGATIVE 06/11/2016 1442   PROTEINUR NEGATIVE 06/11/2016 1442   NITRITE NEGATIVE 06/11/2016 1442   LEUKOCYTESUR NEGATIVE 06/11/2016 1442   Sepsis Labs: @LABRCNTIP (procalcitonin:4,lacticidven:4)  ) Recent Results (from the past 240 hour(s))  MRSA PCR Screening     Status: None   Collection Time: 12/14/17  1:50 AM  Result Value Ref Range Status   MRSA by PCR NEGATIVE NEGATIVE Final    Comment:        The GeneXpert MRSA Assay (FDA approved for NASAL specimens only), is one component of a comprehensive MRSA colonization surveillance program. It is not intended to diagnose MRSA infection nor to guide or monitor treatment for MRSA infections. Performed at [facility], [address], [place], [place] [address]   Respiratory Panel by PCR     Status: None   Collection Time: 12/14/17  9:00 PM  Result Value Ref Range Status   Adenovirus NOT DETECTED NOT DETECTED Final   Coronavirus 229E NOT DETECTED NOT DETECTED Final   Coronavirus HKU1 NOT DETECTED NOT DETECTED Final   Coronavirus NL63 NOT DETECTED NOT DETECTED Final   Coronavirus OC43 NOT DETECTED NOT DETECTED Final   Metapneumovirus NOT DETECTED NOT DETECTED Final   Rhinovirus / Enterovirus NOT DETECTED NOT DETECTED Final   Influenza A NOT DETECTED NOT DETECTED Final   Influenza B NOT DETECTED NOT DETECTED Final   Parainfluenza Virus 1 NOT DETECTED NOT DETECTED Final   Parainfluenza Virus 2 NOT DETECTED NOT DETECTED Final   Parainfluenza Virus 3 NOT DETECTED NOT DETECTED Final   Parainfluenza Virus 4 NOT DETECTED NOT DETECTED Final   Respiratory Syncytial Virus NOT DETECTED NOT DETECTED Final   Bordetella pertussis NOT DETECTED NOT DETECTED Final   Chlamydophila pneumoniae NOT DETECTED NOT DETECTED Final    Mycoplasma pneumoniae NOT DETECTED NOT DETECTED Final    Comment: Performed at [facility], [address], [place], [place] [address]         Radiology Studies: Dg Chest 2 View  Result Date: 12/13/2017 CLINICAL DATA:  Shortness of breath.  Weakness. EXAM: CHEST - 2 VIEW COMPARISON:  Chest CT 11/01/2017.  Chest x-ray 10/19/2017. FINDINGS: AP and lateral views of the chest show cardiomegaly. Stable asymmetric elevation left hemidiaphragm. Mild left base collapse/consolidation is stable since prior studies and may reflect scarring. The visualized bony structures of the thorax are intact. Patient is status post median sternotomy. Telemetry leads overlie the chest.  IMPRESSION: Stable exam without acute cardiopulmonary findings. Opacity at the left base is similar to prior studies and may reflect chronic atelectasis or scarring. Multiple tiny pulmonary nodules seen on previous chest CT not evident by x-ray today. Electronically Signed   By: Kennith Center M.D.   On: 12/13/2017 13:32        Scheduled Meds: . amiodarone  200 mg Oral Daily  . amLODipine  2.5 mg Oral Daily  . calcium-vitamin D  2 tablet Oral Q breakfast  . diclofenac sodium  2 g Topical TID AC & HS  . ipratropium-albuterol  3 mL Nebulization TID  . isosorbide mononitrate  60 mg Oral Daily  . mouth rinse  15 mL Mouth Rinse BID  . methylPREDNISolone (SOLU-MEDROL) injection  60 mg Intravenous Q12H  . metoprolol tartrate  50 mg Oral BID  . pantoprazole  80 mg Oral Daily  . Rivaroxaban  15 mg Oral Q supper  . simvastatin  60 mg Oral q1800  . spironolactone  25 mg Oral Daily  . torsemide  60 mg Oral BID   Continuous Infusions:   LOS: 2 days    Time spent: 40 minutes    Fendi Meinhardt, Roselind Messier, MD [facility] Pager [phone]   If 7PM-7AM, please contact night-coverage [email or web address] Password [email or web address] 12/15/2017, 7:50 AM

## 2017-12-16 DIAGNOSIS — I2510 Atherosclerotic heart disease of native coronary artery without angina pectoris: Secondary | ICD-10-CM

## 2017-12-16 DIAGNOSIS — I482 Chronic atrial fibrillation: Secondary | ICD-10-CM

## 2017-12-16 LAB — BASIC METABOLIC PANEL
Anion gap: 13 (ref 5–15)
BUN: 40 mg/dL — AB (ref 6–20)
CO2: 39 mmol/L — ABNORMAL HIGH (ref 22–32)
CREATININE: 1.57 mg/dL — AB (ref 0.44–1.00)
Calcium: 9.6 mg/dL (ref 8.9–10.3)
Chloride: 91 mmol/L — ABNORMAL LOW (ref 101–111)
GFR calc Af Amer: 35 mL/min — ABNORMAL LOW (ref >60)
GFR calc non Af Amer: 30 mL/min — ABNORMAL LOW (ref >60)
GLUCOSE: 163 mg/dL — AB (ref 65–99)
POTASSIUM: 3.6 mmol/L (ref 3.5–5.1)
SODIUM: 143 mmol/L (ref 135–145)

## 2017-12-16 LAB — CBC
HCT: 34.9 % — ABNORMAL LOW (ref 36.0–46.0)
Hemoglobin: 10.4 g/dL — ABNORMAL LOW (ref 12.0–15.0)
MCH: 27.7 pg (ref 26.0–34.0)
MCHC: 29.8 g/dL — ABNORMAL LOW (ref 30.0–36.0)
MCV: 93.1 fL (ref 78.0–100.0)
PLATELETS: 243 10*3/uL (ref 150–400)
RBC: 3.75 MIL/uL — AB (ref 3.87–5.11)
RDW: 13 % (ref 11.5–15.5)
WBC: 12.5 10*3/uL — AB (ref 4.0–10.5)

## 2017-12-16 LAB — LEGIONELLA PNEUMOPHILA SEROGP 1 UR AG: L. PNEUMOPHILA SEROGP 1 UR AG: NEGATIVE

## 2017-12-16 LAB — MAGNESIUM: MAGNESIUM: 2.2 mg/dL (ref 1.7–2.4)

## 2017-12-16 NOTE — Evaluation (Signed)
Physical Therapy Evaluation Patient Details Name: [name] MRN: [id number] DOB: 09/04/1940 Today's Date: 12/16/2017   History of Present Illness  77 y.o. WF PMHx Anxiety, depression, COPD on 2 L O2 via [place] at rest/3 L O2 with exertion,, OSA, A. fib on anticoagulation Chronic Diastolic CHF, HTN, MV stenosis, CAD, pulmonary HTN, S/P CABG rheumatic AV disease, Hiatal Hernia, HLD. Presents with worseing SOB, likely COPD exacerbation  Clinical Impression  Pt admitted with above diagnosis. Pt currently with functional limitations due to the deficits listed below (see PT Problem List). Pt feeling better than when she was admitted. Ambulated 20' then 37' on 3L O2 with sats remaining in mid 90's. Was very fatigued and SOB by end of 46' with some impulsivity in getting to chair. Uses rollator at home for this reason. Just finished HHPT so will follow acutely but not recommending HH again. Would benefit from [facility] to assist with ADL's.  Pt will benefit from skilled PT to increase their independence and safety with mobility to allow discharge to the venue listed below.       Follow Up Recommendations No PT follow up;Other (comment)(HHaide)    Equipment Recommendations  None recommended by PT    Recommendations for Other Services       Precautions / Restrictions Precautions Precautions: Fall Precaution Comments: watch O2 sats Restrictions Weight Bearing Restrictions: No      Mobility  Bed Mobility Overal bed mobility: Modified Independent             General bed mobility comments: no physical assist needed but did need increases time  Transfers Overall transfer level: Needs assistance Equipment used: Rolling walker (2 wheeled) Transfers: Sit to/from Stand Sit to Stand: Min guard         General transfer comment: min-guard for safety from bed and recliner  Ambulation/Gait Ambulation/Gait assistance: Min guard Ambulation Distance (Feet): 80 Feet(20', seated rest,  60') Assistive device: Rolling walker (2 wheeled) Gait Pattern/deviations: Step-through pattern;Trunk flexed;Decreased stride length Gait velocity: decreased Gait velocity interpretation: <1.31 ft/sec, indicative of household ambulator General Gait Details: pt gets very winded when she tries to speak while ambulating, cued to wait until sitting. Ambulated 20' then rested 3 mins. Then ambualted 67' with RW, was very fatigued by end with some anxiety and decreased safety with turning to chair. O2 sats maintained mid 90's on 3L O2.   Stairs            Wheelchair Mobility    Modified Rankin (Stroke Patients Only)       Balance Overall balance assessment: Needs assistance Sitting-balance support: No upper extremity supported Sitting balance-Leahy Scale: Good     Standing balance support: Bilateral upper extremity supported Standing balance-Leahy Scale: Poor Standing balance comment: reliant on UE support                             Pertinent Vitals/Pain Pain Assessment: No/denies pain    Home Living Family/patient expects to be discharged to:: Private residence Living Arrangements: Spouse/significant other Available Help at Discharge: Family;Available 24 hours/day Type of Home: House Home Access: Ramped entrance     Home Layout: One level Home Equipment: Walker - 4 wheels;Walker - 2 wheels;Hand held shower head;Shower seat;Bedside commode;Wheelchair - manual Additional Comments: pt's husband with B amputee, w/c bound, transfers with sliding board. Son lives with them, works, but is a Music therapist and can leave a job and come to them if needed  Prior Function Level of Independence: Needs assistance   Gait / Transfers Assistance Needed: walks short distances with rollator  ADL's / Homemaking Assistance Needed: son does home mgmt  Comments: Wears 2L O2 at rest, 3 L with activity. Just finished HHPT. Has HHRN that comes as well     Hand Dominance   Dominant  Hand: Right    Extremity/Trunk Assessment   Upper Extremity Assessment Upper Extremity Assessment: Generalized weakness    Lower Extremity Assessment Lower Extremity Assessment: Generalized weakness    Cervical / Trunk Assessment Cervical / Trunk Assessment: Kyphotic  Communication   Communication: No difficulties  Cognition Arousal/Alertness: Awake/alert Behavior During Therapy: WFL for tasks assessed/performed Overall Cognitive Status: Within Functional Limits for tasks assessed                                        General Comments General comments (skin integrity, edema, etc.): pt reports that since she fell a few months ago (bkwd and hit head and neck was sore after) she has periods where her hands feel ice cold (though not to someone else's touch) and she cannot use them.     Exercises     Assessment/Plan    PT Assessment Patient needs continued PT services  PT Problem List Cardiopulmonary status limiting activity;Decreased strength;Decreased activity tolerance;Decreased balance;Decreased mobility       PT Treatment Interventions DME instruction;Gait training;Functional mobility training;Therapeutic activities;Therapeutic exercise;Balance training;Neuromuscular re-education;Patient/family education    PT Goals (Current goals can be found in the Care Plan section)  Acute Rehab PT Goals Patient Stated Goal: return home, breathe better PT Goal Formulation: With patient Time For Goal Achievement: 12/30/17 Potential to Achieve Goals: Good    Frequency Min 3X/week   Barriers to discharge        Co-evaluation               AM-PAC PT "6 Clicks" Daily Activity  Outcome Measure Difficulty turning over in bed (including adjusting bedclothes, sheets and blankets)?: None Difficulty moving from lying on back to sitting on the side of the bed? : A Little Difficulty sitting down on and standing up from a chair with arms (e.g., wheelchair, bedside  commode, etc,.)?: A Little Help needed moving to and from a bed to chair (including a wheelchair)?: A Little Help needed walking in hospital room?: A Little Help needed climbing 3-5 steps with a railing? : A Lot 6 Click Score: 18    End of Session Equipment Utilized During Treatment: Gait belt;Oxygen Activity Tolerance: Patient tolerated treatment well Patient left: in chair;with call bell/phone within reach Nurse Communication: Mobility status PT Visit Diagnosis: Unsteadiness on feet (R26.81);Difficulty in walking, not elsewhere classified (R26.2);Muscle weakness (generalized) (M62.81)    Time: 1610-9604 PT Time Calculation (min) (ACUTE ONLY): 39 min   Charges:   PT Evaluation $PT Eval Moderate Complexity: 1 Mod PT Treatments $Gait Training: 23-37 mins   PT G Codes:        Lyanne Co, PT  Acute Rehab Services  [phone]   [place] L Kinslei Labine 12/16/2017, 1:52 PM

## 2017-12-16 NOTE — Progress Notes (Signed)
PROGRESS NOTE    Patient: [name]     PCP: Maurice Small, MD                    DOB: Mar 02, 1941            DOA: 12/13/2017 ZOX:[id number]             DOS: 12/16/2017, 2:42 PM   LOS: 3 days   Date of Service: The patient was seen and examined on 12/16/2017  Subjective:  She was seen and examined this morning, comfortable sitting in chair denies of having any chest pain, reporting shortness of breath has been steadily improving.  ----------------------------------------------------------------------------------------------------------------------  Brief Narrative:  Ms. Greydis Stlouis is a 77 year old pleasant female with multiple comorbidities including anxiety, depression, COPD O2 dependence to it is via nasal cannula at rest and 3 L with exertion, obstructive sleep apnea, atrial fibrillation on chronic anticoagulation, diastolic CHF, hypertension, mitral valve stenosis, coronary artery disease, pulmonary hypertension, history of CABG, rheumatic AV disease, hiatal hernia and hyperlipidemia presented with progressive shortness of breath. Was admitted for acute on chronic respiratory failure due to COPD exacerbation and CHF.    Principal Problem:   Acute on chronic respiratory failure (HCC) Active Problems:   Coronary atherosclerosis   COPD exacerbation (HCC)   Chronic diastolic heart failure (HCC)   HTN (hypertension)   Cardiomyopathy (HCC)   Atrial fibrillation (HCC)   Mitral valve stenosis   Metabolic alkalosis   Assessment & Plan:   Acute on Chronic respiratory failure with hypoxia -Much improved, -Nonspecific positive troponin thought to be ischemic demand, not complaining any chest pain -proBNP on admission 145, d-dimer negative, chest x-ray was stable, no acute changes or infiltrate -Continue DuoNeb bronchodilators, Pulmicort -Continue IV Solu-Medrol started at 60 twice daily, will taper down -Family satting greater than 90% on 3 L of oxygen -Flutter valve -Holding  off on antibiotics -signs of infection. - Cultures: NGTD  COPD exacerbation -Improving, continue DuoNeb bronchodilators, Pulmicort, Per patient not able to use home inhalers properly, may be causing/contributing to acute respiratory failure. -Have requested home nebulizer machine.   Metabolic Alkalosis  -Much improved -Most likely cause diuretics.  Resolving with discontinuation of diuretics.  - Magnesium WNL   Chronic diastolic CHF/cardiomyopathy - Echocardiogram 3 months ago: EF= 55% with grade 3 diastolic dysfunction -Strict in and out since admission, diuresing well on 2 L since admission -Daily weight Weight change: 0.045 kg (1.6 oz) -Hemoglobin stable, -Continue home medication of amlodipine, Imdur, metoprolol, -Spironolactone 25 mg daily (hold) - Demadex 60 mg BID (hold)  Paroxysmal A. Fib(CHADSVASC=5) - Rate controlled -Currently NSR - See CHF - Xarelto  Essential HTN  -See CHF  CAD -Currently stable, not complaining of any chest pain, resume home medication   Goals of care - 5/24 PT/OT consulted.  Patient frail/deconditioned evaluate for CIR vs SNF   DVT prophylaxis: Xarelto Code Status: Partial Family Communication: None Disposition Plan:  If stable, D/C in 1 to 2 days      Consultants:   none  Cultures 5/23 respiratory virus culture negative  5/23 urine strep pneumo negative 5/23 Legionella urine antigen pending 5/23 sputum pending 5/23 MRSA by PCR negative    Antimicrobials:  Anti-infectives (From admission, onward)   None      Objective: Vitals:   12/16/17 0808 12/16/17 0930 12/16/17 0936 12/16/17 1229  BP: (!) 141/78   131/63  Pulse: 61 65  (!) 59  Resp:      Temp:  98 F (36.7 C)   98.4 F (36.9 C)  TempSrc: Oral   Oral  SpO2: 92%  98% 96%  Weight:      Height:        Intake/Output Summary (Last 24 hours) at 12/16/2017 1442 Last data filed at 12/16/2017 0716 Gross per 24 hour  Intake 720 ml  Output 1700 ml    Net -980 ml   Filed Weights   12/14/17 0056 12/15/17 0447 12/16/17 0356  Weight: 87.3 kg (192 lb 7.4 oz) 87 kg (191 lb 12.8 oz) 87 kg (191 lb 14.4 oz)    Examination:  General exam: Appears calm and comfortable  Psychiatry: Judgement and insight appear normal. Mood & affect appropriate. HEENT: WNLs Respiratory system: Mild diffuse wheezing, minimal bronchitis, positive breath sounds diffusely Cardiovascular system: S1 & S2 heard, RRR. No JVD, murmurs, rubs, gallops or clicks. No pedal edema. Gastrointestinal system: Abd. nondistended, soft and nontender. No organomegaly or masses felt. Normal bowel sounds heard. Central nervous system: Alert and oriented. No focal neurological deficits. Extremities: Symmetric 5 x 5 power.  +1 edema Skin: No rashes, lesions or ulcers   Data Reviewed: I have personally reviewed following labs and imaging studies  CBC: Recent Labs  Lab 12/13/17 1153 12/14/17 0333 12/15/17 0702 12/16/17 0416  WBC 7.5 9.3 16.6* 12.5*  HGB 10.2* 9.5* 10.0* 10.4*  HCT 34.0* 31.4* 33.5* 34.9*  MCV 93.7 90.8 93.1 93.1  PLT 217 214 235 243   Basic Metabolic Panel: Recent Labs  Lab 12/13/17 1153 12/14/17 0333 12/14/17 1852 12/15/17 0702 12/15/17 0802 12/16/17 0416  NA 141 139 140  --  141 143  K 3.8 4.2 4.1  --  4.2 3.6  CL 93* 92* 92*  --  94* 91*  CO2 37* 36* 36*  --  33* 39*  GLUCOSE 150* 153* 135*  --  111* 163*  BUN 15 20 30*  --  36* 40*  CREATININE 1.28* 1.52* 1.93*  --  1.38* 1.57*  CALCIUM 9.5 9.5 9.8  --  9.9 9.6  MG  --   --   --  2.4  --  2.2   GFR: Estimated Creatinine Clearance: 32.8 mL/min (A) (by C-G formula based on SCr of 1.57 mg/dL (H)). Liver Function Tests: No results for input(s): AST, ALT, ALKPHOS, BILITOT, PROT, ALBUMIN in the last 168 hours. No results for input(s): LIPASE, AMYLASE in the last 168 hours. No results for input(s): AMMONIA in the last 168 hours. Coagulation Profile: No results for input(s): INR, PROTIME in the  last 168 hours. Cardiac Enzymes: Recent Labs  Lab 12/13/17 1557 12/14/17 1852 12/15/17 0108 12/15/17 0702  TROPONINI <0.03 0.03* 0.03* <0.03  Anemia Panel: Recent Labs    12/14/17 1852  VITAMINB12 243  FOLATE 12.4  FERRITIN 20  TIBC 476*  IRON 26*  RETICCTPCT 2.0   Sepsis Labs: No results for input(s): PROCALCITON, LATICACIDVEN in the last 168 hours.  Recent Results (from the past 240 hour(s))  MRSA PCR Screening     Status: None   Collection Time: 12/14/17  1:50 AM  Result Value Ref Range Status   MRSA by PCR NEGATIVE NEGATIVE Final    Comment:        The GeneXpert MRSA Assay (FDA approved for NASAL specimens only), is one component of a comprehensive MRSA colonization surveillance program. It is not intended to diagnose MRSA infection nor to guide or monitor treatment for MRSA infections. Performed at [facility], [address] [place], [place] [address]   Respiratory Panel by PCR     Status: None   Collection Time: 12/14/17  9:00 PM  Result Value Ref Range Status   Adenovirus NOT DETECTED NOT DETECTED Final   Coronavirus 229E NOT DETECTED NOT DETECTED Final   Coronavirus HKU1 NOT DETECTED NOT DETECTED Final   Coronavirus NL63 NOT DETECTED NOT DETECTED Final   Coronavirus OC43 NOT DETECTED NOT DETECTED Final   Metapneumovirus NOT DETECTED NOT DETECTED Final   Rhinovirus / Enterovirus NOT DETECTED NOT DETECTED Final   Influenza A NOT DETECTED NOT DETECTED Final   Influenza B NOT DETECTED NOT DETECTED Final   Parainfluenza Virus 1 NOT DETECTED NOT DETECTED Final   Parainfluenza Virus 2 NOT DETECTED NOT DETECTED Final   Parainfluenza Virus 3 NOT DETECTED NOT DETECTED Final   Parainfluenza Virus 4 NOT DETECTED NOT DETECTED Final   Respiratory Syncytial Virus NOT DETECTED NOT DETECTED Final   Bordetella pertussis NOT DETECTED NOT DETECTED Final   Chlamydophila pneumoniae NOT DETECTED NOT DETECTED Final   Mycoplasma pneumoniae NOT DETECTED NOT DETECTED  Final    Comment: Performed at [facility], [address] [place], [place] [address]      Radiology Studies: No results found.  Scheduled Meds: . amiodarone  200 mg Oral Daily  . amLODipine  2.5 mg Oral Daily  . budesonide (PULMICORT) nebulizer solution  0.25 mg Nebulization BID  . calcium-vitamin D  2 tablet Oral Q breakfast  . diclofenac sodium  2 g Topical TID AC & HS  . ipratropium-albuterol  3 mL Nebulization TID  . isosorbide mononitrate  60 mg Oral Daily  . mouth rinse  15 mL Mouth Rinse BID  . methylPREDNISolone (SOLU-MEDROL) injection  60 mg Intravenous Q12H  . metoprolol tartrate  50 mg Oral BID  . pantoprazole  80 mg Oral Daily  . Rivaroxaban  15 mg Oral Q supper  . simvastatin  60 mg Oral q1800  . spironolactone  25 mg Oral Daily  . torsemide  60 mg Oral BID   Continuous Infusions:  Time spent: >25 minutes  Kendell Bane, MD [facility],  Pager [phone]  If 7PM-7AM, please contact night-coverage [email or web address]   Password [email or web address]  12/16/2017, 2:42 PM

## 2017-12-16 NOTE — Evaluation (Signed)
Occupational Therapy Evaluation Patient Details Name: [name] MRN: [id number] DOB: 07/03/1941 Today's Date: 12/16/2017    History of Present Illness 77 y.o. female PMHx Anxiety, depression, COPD on 2 L O2 via [place] at rest/3 L O2 with exertion,, OSA, A. fib on anticoagulation Chronic Diastolic CHF, HTN, MV stenosis, CAD, pulmonary HTN, S/P CABG rheumatic AV disease, Hiatal Hernia, HLD. Presents with worseing SOB, likely COPD exacerbation   Clinical Impression   PTA, pt lived with her husband and son and was performing BADLs. Pt currently requiring Min Guard A for ADLs in standing, LB ADLs, and functional mobility with RW. Pt presenting with poor activity tolerance and endurance as seen by decreased SpO2 and fatigue. Pt on 2L-3L O2 during activities and fluctuating between low 80s-90s. Pt would benefit from further acute OT to facilitate safe dc. Recommend dc to home with initial 24 hour supervision once medically stable per physician.     Follow Up Recommendations  No OT follow up;Supervision/Assistance - 24 hour    Equipment Recommendations  None recommended by OT    Recommendations for Other Services PT consult     Precautions / Restrictions Precautions Precautions: Fall Precaution Comments: watch O2 sats Restrictions Weight Bearing Restrictions: No      Mobility Bed Mobility Overal bed mobility: Modified Independent             General bed mobility comments: Pt in recliner upon arrival  Transfers Overall transfer level: Needs assistance Equipment used: Rolling walker (2 wheeled) Transfers: Sit to/from Stand Sit to Stand: Min guard         General transfer comment: min-guard for safety    Balance Overall balance assessment: Needs assistance Sitting-balance support: No upper extremity supported Sitting balance-Leahy Scale: Good     Standing balance support: Bilateral upper extremity supported Standing balance-Leahy Scale: Poor Standing balance comment:  reliant on UE support                           ADL either performed or assessed with clinical judgement   ADL Overall ADL's : Needs assistance/impaired Eating/Feeding: Set up;Sitting   Grooming: Oral care;Wash/dry hands;Wash/dry face;Min guard;Sitting Grooming Details (indicate cue type and reason): Pt performing oral care, washing her face, and hand hygiene at sink with Min Guard A for safety. SpO2 dropping to low 80s on 3L during acitvity. Pt reporting she feels fatigued after grooming tasks.  Upper Body Bathing: Set up;Sitting   Lower Body Bathing: Min guard;Sit to/from stand   Upper Body Dressing : Set up;Sitting   Lower Body Dressing: Min guard;Sit to/from stand Lower Body Dressing Details (indicate cue type and reason): Pt donning socks. Min Guard A for safety in standing Toilet Transfer: Min guard;RW(simulated to Medical illustrator)   Toileting- Architect and Hygiene: Min guard;Sit to/from stand Toileting - Clothing Manipulation Details (indicate cue type and reason): Pt with urinary incontiance while standing at sink. Pt completing peri care at sink with Min Guard for safety.     Functional mobility during ADLs: Min guard;Rolling walker General ADL Comments: Pt presenting with decreased acitivty tolerance and endurance as seen y fatigue and decreased SpO2      Vision Baseline Vision/History: Wears glasses Patient Visual Report: No change from baseline       Perception     Praxis      Pertinent Vitals/Pain Pain Assessment: No/denies pain     Hand Dominance Right   Extremity/Trunk Assessment Upper Extremity Assessment Upper Extremity  Assessment: Generalized weakness   Lower Extremity Assessment Lower Extremity Assessment: Generalized weakness   Cervical / Trunk Assessment Cervical / Trunk Assessment: Kyphotic   Communication Communication Communication: No difficulties   Cognition Arousal/Alertness: Awake/alert Behavior During Therapy: WFL  for tasks assessed/performed Overall Cognitive Status: Within Functional Limits for tasks assessed                                     General Comments  pt reports that since she fell a few months ago (bkwd and hit head and neck was sore after) she has periods where her hands feel ice cold (though not to someone else's touch) and she cannot use them.     Exercises     Shoulder Instructions      Home Living Family/patient expects to be discharged to:: Private residence Living Arrangements: Spouse/significant other Available Help at Discharge: Family;Available 24 hours/day Type of Home: House Home Access: Ramped entrance     Home Layout: One level     Bathroom Shower/Tub: Producer, television/film/video: Standard Bathroom Accessibility: Yes   Home Equipment: Environmental consultant - 4 wheels;Walker - 2 wheels;Hand held shower head;Shower seat;Bedside commode;Wheelchair - manual   Additional Comments: pt's husband with B amputee, w/c bound, transfers with sliding board. Son lives with them, works, but is a Music therapist and can leave a job and come to them if needed      Prior Functioning/Environment Level of Independence: Needs Financial planner / Transfers Assistance Needed: walks short distances with rollator ADL's / Homemaking Assistance Needed: son does home mgmt and driving   Comments: Wears 2L O2 at rest, 3 L with activity. Just finished HHPT/OT. Has HHRN that comes as well        OT Problem List: Decreased strength;Decreased range of motion;Decreased activity tolerance;Impaired balance (sitting and/or standing);Decreased knowledge of use of DME or AE;Decreased knowledge of precautions      OT Treatment/Interventions: Self-care/ADL training;Therapeutic exercise;Energy conservation;DME and/or AE instruction;Therapeutic activities;Patient/family education    OT Goals(Current goals can be found in the care plan section) Acute Rehab OT Goals Patient Stated Goal: return  home, breathe better OT Goal Formulation: With patient Time For Goal Achievement: 12/30/17 Potential to Achieve Goals: Good ADL Goals Pt Will Perform Grooming: with modified independence;standing Pt Will Perform Lower Body Dressing: with modified independence;sit to/from stand Pt Will Transfer to Toilet: with modified independence;ambulating;regular height toilet Pt Will Perform Toileting - Clothing Manipulation and hygiene: with modified independence;sit to/from stand Additional ADL Goal #1: Pt will verbalize three energy conservation techniques with 1-2 cues  OT Frequency: Min 2X/week   Barriers to D/C:            Co-evaluation              AM-PAC PT "6 Clicks" Daily Activity     Outcome Measure Help from another person eating meals?: None Help from another person taking care of personal grooming?: A Little Help from another person toileting, which includes using toliet, bedpan, or urinal?: A Little Help from another person bathing (including washing, rinsing, drying)?: A Little Help from another person to put on and taking off regular upper body clothing?: A Little Help from another person to put on and taking off regular lower body clothing?: A Little 6 Click Score: 19   End of Session Equipment Utilized During Treatment: Gait belt;Rolling walker;Oxygen(2-3L) Nurse Communication: Mobility status  Activity Tolerance: Patient  tolerated treatment well Patient left: in chair;with call bell/phone within reach  OT Visit Diagnosis: Unsteadiness on feet (R26.81);Other abnormalities of gait and mobility (R26.89);Muscle weakness (generalized) (M62.81)                Time: 4917-9150 OT Time Calculation (min): 30 min Charges:  OT General Charges $OT Visit: 1 Visit OT Evaluation $OT Eval Moderate Complexity: 1 Mod OT Treatments $Self Care/Home Management : 8-22 mins G-Codes:     Joshuajames Moehring MSOT, OTR/L Acute Rehab Pager: [phone] Office: [phone]  Theodoro Grist  Jlon Betker 12/16/2017, 2:13 PM

## 2017-12-16 NOTE — Plan of Care (Signed)
  Problem: Clinical Measurements: Goal: Ability to maintain clinical measurements within normal limits will improve Outcome: Progressing   

## 2017-12-17 DIAGNOSIS — F320 Major depressive disorder, single episode, mild: Secondary | ICD-10-CM

## 2017-12-17 DIAGNOSIS — N183 Chronic kidney disease, stage 3 (moderate): Secondary | ICD-10-CM

## 2017-12-17 DIAGNOSIS — G629 Polyneuropathy, unspecified: Secondary | ICD-10-CM

## 2017-12-17 LAB — CBC
HCT: 34.3 % — ABNORMAL LOW (ref 36.0–46.0)
Hemoglobin: 10.3 g/dL — ABNORMAL LOW (ref 12.0–15.0)
MCH: 27.6 pg (ref 26.0–34.0)
MCHC: 30 g/dL (ref 30.0–36.0)
MCV: 92 fL (ref 78.0–100.0)
PLATELETS: 252 10*3/uL (ref 150–400)
RBC: 3.73 MIL/uL — AB (ref 3.87–5.11)
RDW: 13.1 % (ref 11.5–15.5)
WBC: 12.1 10*3/uL — ABNORMAL HIGH (ref 4.0–10.5)

## 2017-12-17 LAB — BASIC METABOLIC PANEL
ANION GAP: 11 (ref 5–15)
BUN: 51 mg/dL — ABNORMAL HIGH (ref 6–20)
CALCIUM: 9.4 mg/dL (ref 8.9–10.3)
CO2: 41 mmol/L — ABNORMAL HIGH (ref 22–32)
Chloride: 88 mmol/L — ABNORMAL LOW (ref 101–111)
Creatinine, Ser: 1.99 mg/dL — ABNORMAL HIGH (ref 0.44–1.00)
GFR calc Af Amer: 27 mL/min — ABNORMAL LOW (ref >60)
GFR calc non Af Amer: 23 mL/min — ABNORMAL LOW (ref >60)
GLUCOSE: 149 mg/dL — AB (ref 65–99)
Potassium: 3.8 mmol/L (ref 3.5–5.1)
Sodium: 140 mmol/L (ref 135–145)

## 2017-12-17 LAB — MAGNESIUM: MAGNESIUM: 2.5 mg/dL — AB (ref 1.7–2.4)

## 2017-12-17 MED ORDER — PREGABALIN 25 MG PO CAPS
25.0000 mg | ORAL_CAPSULE | Freq: Every day | ORAL | Status: DC
  Administered 2017-12-17 – 2017-12-25 (×9): 25 mg via ORAL
  Filled 2017-12-17 (×9): qty 1

## 2017-12-17 MED ORDER — SODIUM CHLORIDE 0.9 % IV SOLN
INTRAVENOUS | Status: DC
  Administered 2017-12-17 – 2017-12-18 (×2): via INTRAVENOUS

## 2017-12-17 NOTE — Progress Notes (Signed)
PROGRESS NOTE    [name]  ZOX:[id number] DOB: February 18, 1941 DOA: 12/13/2017 PCP: Maurice Small, MD   Brief Narrative:  77 y.o. WF PMHx Anxiety, depression, COPD on 2 L O2 via [place] at rest/3 L O2 with exertion,, OSA, A. fib on anticoagulation Chronic Diastolic CHF, HTN, MV stenosis, CAD, pulmonary HTN, S/P CABG rheumatic AV disease, Hiatal Hernia, HLD  Presents to the emergency Department chief complaint worsening shortness of breath.Initial evaluation reveals acute on chronic respiratory failure likely related to COPD exacerbation.   Information is obtained from the patient and the daughter who is at the bedside. Patient reports she is on 2 L of home oxygen at rest and 3 L with activity and has been for the last several years. She states over the last couple of days her shortness of breath has worsened and she is more short of breath at rest than before. She has required 3-4 L of oxygen with activity. Associated symptoms include chest "tightness" left anterior nonradiating. She states she took a nitroglycerin and the pain resolved. She denies any nausea vomiting diaphoresis or lower extremity edema. She denies palpitations cough headache dizziness syncope or near-syncope. She denies dysuria hematuria frequency or urgency. She states she has a nebulizer machine at home but does not know how to use it. She also reports prescribed Symbicort but she "doesn't know how to do it".       ED Course: emergency department she is afebrile hemodynamically stable with mild tachycardia and tachypnea. Oxygen saturation level greater than 90% on 3 L nasal cannula.She is provided with nebulizer 225 mg of Solu-Medrol.    Subjective: 5/26 A/O x4 Demadex and Spironolactone restarted metabolic alkalosis increased--> increased acute on chronic S OB, negative CP, negative abdominal pain.  Positive depression (secondary to death of daughter).  Positive neuropathy bilateral upper extremity and bilateral lower  extremities had been started on Lyrica but positive side effects (goofy)    Assessment & Plan:   Principal Problem:   Acute on chronic respiratory failure (HCC) Active Problems:   Coronary atherosclerosis   COPD exacerbation (HCC)   Chronic diastolic heart failure (HCC)   HTN (hypertension)   Cardiomyopathy (HCC)   Atrial fibrillation (HCC)   Mitral valve stenosis   Metabolic alkalosis  Acute on Chronic respiratory failure with hypoxia - BNP= 145 - Cardiac enzymes: Barely positive secondary to demand. Recent Labs  Lab 12/13/17 1557 12/14/17 1852 12/15/17 0108 12/15/17 0702  TROPONINI <0.03 0.03* 0.03* <0.03  - D-dimer: Negative - PCXR: Stable findings nondiagnostic for acute cause of increased O2 demand.  Chronic changes see results below - DuoNeb TID -Pulmicort; BID sees tolerated without any side effects.)  -Solu-Medrol 60 mg  BID -Titrate O2 to maintain SPO2 89-93% -Flutter valve -Hold on antibiotics at this point not fully convinced infectious in nature. - Cultures: NGTD  COPD exacerbation -Per patient not able to use home inhalers properly, may be causing/contributing to acute respiratory failure. -Have requested home nebulizer machine. -See respiratory failure  CKD stage III (baseline Cr~130) - Diuretics restarted 5/25 now patient's renal failure worsening - HOLD DIURETICS (Demadex/spironolactone) Recent Labs  Lab 12/13/17 1153 12/14/17 0333 12/14/17 1852 12/15/17 0802 12/16/17 0416 12/17/17 0353  CREATININE 1.28* 1.52* 1.93* 1.38* 1.57* 1.99*  -Gently hydrate normal saline 27ml/hr   Metabolic Alkalosis  -Most likely cause diuretics.  Resolving with discontinuation of diuretics.  - Magnesium WNL - See CHF  Chronic diastolic CHF/cardiomyopathy - Echocardiogram 3 months ago: EF= 55% with grade 3 diastolic  dysfunction -Strict in and out since admission -1.0 L  -Daily weight Filed Weights   12/15/17 0447 12/16/17 0356 12/17/17 0338  Weight: 191 lb  12.8 oz (87 kg) 191 lb 14.4 oz (87 kg) 192 lb 3.2 oz (87.2 kg)  -Transfuse for hemoglobin<8 - amlodipine 2.5 mg daily - Imdur 60 mg daily - Metoprolol 50 mg BID -Spironolactone 25 mg daily (hold) -  Demadex 60 mg BID (hold)  Paroxysmal A. Fib(CHADSVASC=5) - Rate controlled -Currently NSR - See CHF - Xarelto  Essential HTN  -See CHF   CAD   Neuropathy - Lyrica 25 mg QHS  Depression -Secondary to death of patient's daughter will hold on starting any new medication at this time would start SSRI when appropriate    Goals of care - 5/24 PT/OT consulted.  Patient frail/deconditioned evaluate for CIR vs SNF   DVT prophylaxis: Xarelto Code Status: Partial Family Communication: None Disposition Plan:    Consultants:  None  Procedures/Significant Events:     I have personally reviewed and interpreted all radiology studies and my findings are as above.  VENTILATOR SETTINGS: BiPAP     Cultures 5/23 respiratory virus culture negative  5/23 urine strep pneumo negative 5/23 Legionella urine antigen pending 5/23 sputum pending 5/23 MRSA by PCR negative   Antimicrobials: Anti-infectives (From admission, onward)   None       Devices    LINES / TUBES:      Continuous Infusions:   Objective: Vitals:   12/17/17 0338 12/17/17 0730 12/17/17 0814 12/17/17 0816  BP: (!) 122/49 135/66    Pulse: (!) 54 61    Resp: 18 20    Temp: 97.8 F (36.6 C) 97.9 F (36.6 C)    TempSrc: Oral Oral    SpO2: 96% 97% 95% 96%  Weight: 192 lb 3.2 oz (87.2 kg)     Height:        Intake/Output Summary (Last 24 hours) at 12/17/2017 0935 Last data filed at 12/17/2017 0345 Gross per 24 hour  Intake 120 ml  Output 400 ml  Net -280 ml   Filed Weights   12/15/17 0447 12/16/17 0356 12/17/17 0338  Weight: 191 lb 12.8 oz (87 kg) 191 lb 14.4 oz (87 kg) 192 lb 3.2 oz (87.2 kg)    Physical Exam:  General: A/O x4, acute on chronic respiratory distress Neck:  Negative  scars, masses, torticollis, lymphadenopathy, JVD Lungs: diffuse poor air movement, negative wheezes or crackles Cardiovascular: Regular rate and rhythm without murmur gallop or rub normal S1 and S2 Abdomen: negative abdominal pain, nondistended, positive soft, bowel sounds, no rebound, no ascites, no appreciable mass Extremities: No significant cyanosis, clubbing, or edema bilateral lower extremities Skin: Negative rashes, lesions, ulcers Psychiatric: Positive depression, negative anxiety, negative fatigue, negative mania  Central nervous system:  Cranial nerves II through XII intact, tongue/uvula midline, all extremities muscle strength 5/5, sensation intact throughout, negative dysarthria, negative expressive aphasia, negative receptive aphasia.  .     Data Reviewed: Care during the described time interval was provided by me .  I have reviewed this patient's available data, including medical history, events of note, physical examination, and all test results as part of my evaluation.   CBC: Recent Labs  Lab 12/13/17 1153 12/14/17 0333 12/15/17 0702 12/16/17 0416 12/17/17 0353  WBC 7.5 9.3 16.6* 12.5* 12.1*  HGB 10.2* 9.5* 10.0* 10.4* 10.3*  HCT 34.0* 31.4* 33.5* 34.9* 34.3*  MCV 93.7 90.8 93.1 93.1 92.0  PLT 217 214 235 243  252   Basic Metabolic Panel: Recent Labs  Lab 12/14/17 0333 12/14/17 1852 12/15/17 0702 12/15/17 0802 12/16/17 0416 12/17/17 0353  NA 139 140  --  141 143 140  K 4.2 4.1  --  4.2 3.6 3.8  CL 92* 92*  --  94* 91* 88*  CO2 36* 36*  --  33* 39* 41*  GLUCOSE 153* 135*  --  111* 163* 149*  BUN 20 30*  --  36* 40* 51*  CREATININE 1.52* 1.93*  --  1.38* 1.57* 1.99*  CALCIUM 9.5 9.8  --  9.9 9.6 9.4  MG  --   --  2.4  --  2.2 2.5*   GFR: Estimated Creatinine Clearance: 25.9 mL/min (A) (by C-G formula based on SCr of 1.99 mg/dL (H)). Liver Function Tests: No results for input(s): AST, ALT, ALKPHOS, BILITOT, PROT, ALBUMIN in the last 168 hours. No  results for input(s): LIPASE, AMYLASE in the last 168 hours. No results for input(s): AMMONIA in the last 168 hours. Coagulation Profile: No results for input(s): INR, PROTIME in the last 168 hours. Cardiac Enzymes: Recent Labs  Lab 12/13/17 1557 12/14/17 1852 12/15/17 0108 12/15/17 0702  TROPONINI <0.03 0.03* 0.03* <0.03   BNP (last 3 results) No results for input(s): PROBNP in the last 8760 hours. HbA1C: No results for input(s): HGBA1C in the last 72 hours. CBG: No results for input(s): GLUCAP in the last 168 hours. Lipid Profile: No results for input(s): CHOL, HDL, LDLCALC, TRIG, CHOLHDL, LDLDIRECT in the last 72 hours. Thyroid Function Tests: No results for input(s): TSH, T4TOTAL, FREET4, T3FREE, THYROIDAB in the last 72 hours. Anemia Panel: Recent Labs    12/14/17 1852  VITAMINB12 243  FOLATE 12.4  FERRITIN 20  TIBC 476*  IRON 26*  RETICCTPCT 2.0   Urine analysis:    Component Value Date/Time   COLORURINE YELLOW 06/11/2016 1442   APPEARANCEUR CLEAR 06/11/2016 1442   LABSPEC 1.005 06/11/2016 1442   PHURINE 7.5 06/11/2016 1442   GLUCOSEU NEGATIVE 06/11/2016 1442   HGBUR NEGATIVE 06/11/2016 1442   BILIRUBINUR NEGATIVE 06/11/2016 1442   KETONESUR NEGATIVE 06/11/2016 1442   PROTEINUR NEGATIVE 06/11/2016 1442   NITRITE NEGATIVE 06/11/2016 1442   LEUKOCYTESUR NEGATIVE 06/11/2016 1442   Sepsis Labs: @LABRCNTIP (procalcitonin:4,lacticidven:4)  ) Recent Results (from the past 240 hour(s))  MRSA PCR Screening     Status: None   Collection Time: 12/14/17  1:50 AM  Result Value Ref Range Status   MRSA by PCR NEGATIVE NEGATIVE Final    Comment:        The GeneXpert MRSA Assay (FDA approved for NASAL specimens only), is one component of a comprehensive MRSA colonization surveillance program. It is not intended to diagnose MRSA infection nor to guide or monitor treatment for MRSA infections. Performed at [facility], [address] [place], [place]  [address]   Respiratory Panel by PCR     Status: None   Collection Time: 12/14/17  9:00 PM  Result Value Ref Range Status   Adenovirus NOT DETECTED NOT DETECTED Final   Coronavirus 229E NOT DETECTED NOT DETECTED Final   Coronavirus HKU1 NOT DETECTED NOT DETECTED Final   Coronavirus NL63 NOT DETECTED NOT DETECTED Final   Coronavirus OC43 NOT DETECTED NOT DETECTED Final   Metapneumovirus NOT DETECTED NOT DETECTED Final   Rhinovirus / Enterovirus NOT DETECTED NOT DETECTED Final   Influenza A NOT DETECTED NOT DETECTED Final   Influenza B NOT DETECTED NOT DETECTED Final   Parainfluenza  Virus 1 NOT DETECTED NOT DETECTED Final   Parainfluenza Virus 2 NOT DETECTED NOT DETECTED Final   Parainfluenza Virus 3 NOT DETECTED NOT DETECTED Final   Parainfluenza Virus 4 NOT DETECTED NOT DETECTED Final   Respiratory Syncytial Virus NOT DETECTED NOT DETECTED Final   Bordetella pertussis NOT DETECTED NOT DETECTED Final   Chlamydophila pneumoniae NOT DETECTED NOT DETECTED Final   Mycoplasma pneumoniae NOT DETECTED NOT DETECTED Final    Comment: Performed at [facility], [address] [place], [place] [address]         Radiology Studies: No results found.      Scheduled Meds: . amiodarone  200 mg Oral Daily  . amLODipine  2.5 mg Oral Daily  . budesonide (PULMICORT) nebulizer solution  0.25 mg Nebulization BID  . calcium-vitamin D  2 tablet Oral Q breakfast  . diclofenac sodium  2 g Topical TID AC & HS  . ipratropium-albuterol  3 mL Nebulization TID  . isosorbide mononitrate  60 mg Oral Daily  . mouth rinse  15 mL Mouth Rinse BID  . methylPREDNISolone (SOLU-MEDROL) injection  60 mg Intravenous Q12H  . metoprolol tartrate  50 mg Oral BID  . pantoprazole  80 mg Oral Daily  . Rivaroxaban  15 mg Oral Q supper  . simvastatin  60 mg Oral q1800   Continuous Infusions:   LOS: 4 days    Time spent: 40 minutes    Emauri Krygier, Roselind Messier, MD [facility] Pager [phone]   If  7PM-7AM, please contact night-coverage [email or web address] Password [facility] 12/17/2017, 9:35 AM

## 2017-12-18 LAB — BASIC METABOLIC PANEL
ANION GAP: 10 (ref 5–15)
BUN: 50 mg/dL — ABNORMAL HIGH (ref 6–20)
CHLORIDE: 93 mmol/L — AB (ref 101–111)
CO2: 40 mmol/L — AB (ref 22–32)
Calcium: 9 mg/dL (ref 8.9–10.3)
Creatinine, Ser: 1.54 mg/dL — ABNORMAL HIGH (ref 0.44–1.00)
GFR calc non Af Amer: 31 mL/min — ABNORMAL LOW (ref >60)
GFR, EST AFRICAN AMERICAN: 36 mL/min — AB (ref >60)
GLUCOSE: 162 mg/dL — AB (ref 65–99)
Potassium: 3.3 mmol/L — ABNORMAL LOW (ref 3.5–5.1)
Sodium: 143 mmol/L (ref 135–145)

## 2017-12-18 LAB — CBC
HEMATOCRIT: 33.5 % — AB (ref 36.0–46.0)
HEMOGLOBIN: 10.1 g/dL — AB (ref 12.0–15.0)
MCH: 27.8 pg (ref 26.0–34.0)
MCHC: 30.1 g/dL (ref 30.0–36.0)
MCV: 92.3 fL (ref 78.0–100.0)
Platelets: 229 10*3/uL (ref 150–400)
RBC: 3.63 MIL/uL — ABNORMAL LOW (ref 3.87–5.11)
RDW: 13 % (ref 11.5–15.5)
WBC: 10.1 10*3/uL (ref 4.0–10.5)

## 2017-12-18 LAB — MAGNESIUM: Magnesium: 2.2 mg/dL (ref 1.7–2.4)

## 2017-12-18 MED ORDER — IPRATROPIUM-ALBUTEROL 0.5-2.5 (3) MG/3ML IN SOLN: 3.0000 mL | RESPIRATORY_TRACT | Status: DC | PRN

## 2017-12-18 MED ORDER — IPRATROPIUM-ALBUTEROL 0.5-2.5 (3) MG/3ML IN SOLN: 3.0000 mL | RESPIRATORY_TRACT | Status: DC

## 2017-12-18 MED ORDER — ACETAMINOPHEN 325 MG PO TABS
650.0000 mg | ORAL_TABLET | Freq: Two times a day (BID) | ORAL | Status: DC | PRN
  Administered 2017-12-19 – 2017-12-26 (×3): 650 mg via ORAL
  Filled 2017-12-18 (×3): qty 2

## 2017-12-18 MED ORDER — METHYLPREDNISOLONE SODIUM SUCC 125 MG IJ SOLR
80.0000 mg | Freq: Two times a day (BID) | INTRAMUSCULAR | Status: DC
  Administered 2017-12-18 – 2017-12-19 (×2): 80 mg via INTRAVENOUS
  Filled 2017-12-18 (×2): qty 2

## 2017-12-18 MED ORDER — POTASSIUM CHLORIDE CRYS ER 20 MEQ PO TBCR
40.0000 meq | EXTENDED_RELEASE_TABLET | Freq: Two times a day (BID) | ORAL | Status: AC
  Administered 2017-12-18 – 2017-12-19 (×3): 40 meq via ORAL
  Filled 2017-12-18 (×3): qty 2

## 2017-12-18 NOTE — Plan of Care (Signed)
°  Problem: Coping: °Goal: Level of anxiety will decrease °Outcome: Progressing °  °

## 2017-12-18 NOTE — Progress Notes (Signed)
Physical Therapy Treatment Patient Details Name: [name] MRN: [id number] DOB: 22-Aug-1940 Today's Date: 12/18/2017    History of Present Illness 77 y.o. WF PMHx Anxiety, depression, COPD on 2 L O2 via [place] at rest/3 L O2 with exertion,, OSA, A. fib on anticoagulation Chronic Diastolic CHF, HTN, MV stenosis, CAD, pulmonary HTN, S/P CABG rheumatic AV disease, Hiatal Hernia, HLD. Presents with worseing SOB, likely COPD exacerbation    PT Comments    Pt making good progress. Dyspnea 2-3/4 with activity on 3L. SpO2 monitor unable to obtain reading so pt using dyspnea as guide for activity.    Follow Up Recommendations  No PT follow up;Other (comment)(HHaide)     Equipment Recommendations  None recommended by PT    Recommendations for Other Services       Precautions / Restrictions Precautions Precautions: Fall Precaution Comments: watch O2 sats Restrictions Weight Bearing Restrictions: No    Mobility  Bed Mobility               General bed mobility comments: Pt sitting EOB  Transfers Overall transfer level: Needs assistance Equipment used: Rolling walker (2 wheeled) Transfers: Sit to/from [facility] Sit to Stand: Min guard Stand pivot transfers: Min guard       General transfer comment: Assist for lines and safety  Ambulation/Gait Ambulation/Gait assistance: Supervision Ambulation Distance (Feet): 100 Feet(x 2) Assistive device: 4-wheeled walker Gait Pattern/deviations: Step-through pattern;Trunk flexed;Decreased stride length Gait velocity: decreased Gait velocity interpretation: <1.31 ft/sec, indicative of household ambulator General Gait Details: Pt amb on 3L of O2. One sitting rest break. SpO2 monitor not getting good pleth.   Stairs             Wheelchair Mobility    Modified Rankin (Stroke Patients Only)       Balance Overall balance assessment: Needs assistance Sitting-balance support: No upper extremity  supported Sitting balance-Leahy Scale: Good     Standing balance support: No upper extremity supported;During functional activity Standing balance-Leahy Scale: Fair                              Cognition Arousal/Alertness: Awake/alert Behavior During Therapy: WFL for tasks assessed/performed Overall Cognitive Status: Within Functional Limits for tasks assessed                                        Exercises      General Comments        Pertinent Vitals/Pain Pain Assessment: No/denies pain    Home Living                      Prior Function            PT Goals (current goals can now be found in the care plan section) Progress towards PT goals: Progressing toward goals;Goals met and updated - see care plan    Frequency    Min 3X/week      PT Plan Current plan remains appropriate    Co-evaluation              AM-PAC PT "6 Clicks" Daily Activity  Outcome Measure  Difficulty turning over in bed (including adjusting bedclothes, sheets and blankets)?: None Difficulty moving from lying on back to sitting on the side of the bed? : A Little Difficulty sitting down on and standing  up from a chair with arms (e.g., wheelchair, bedside commode, etc,.)?: A Little Help needed moving to and from a bed to chair (including a wheelchair)?: A Little Help needed walking in hospital room?: A Little Help needed climbing 3-5 steps with a railing? : A Lot 6 Click Score: 18    End of Session Equipment Utilized During Treatment: Gait belt;Oxygen Activity Tolerance: Patient tolerated treatment well Patient left: Other (comment)(With OT) Nurse Communication: Mobility status PT Visit Diagnosis: Unsteadiness on feet (R26.81);Difficulty in walking, not elsewhere classified (R26.2);Muscle weakness (generalized) (M62.81)     Time: 9191-6606 PT Time Calculation (min) (ACUTE ONLY): 36 min  Charges:  $Gait Training: 23-37 mins                     G Codes:       [facility] PT [place] 12/18/2017, 10:03 AM

## 2017-12-18 NOTE — Progress Notes (Signed)
Scheduled neb not given at this time, pt is sleeping comfortably. No distress noted on inspection. PRN neb will be giving if needed for SOB/wheezing.

## 2017-12-18 NOTE — Care Management Note (Signed)
Case Management Note  Patient Details  Name: [name] MRN: [id number] Date of Birth: 08-08-1940  Subjective/Objective:                    Action/Plan: Pt discharging home with orders for [facility] services. Pt was recently active with [facility] and would like to use them again. Dan with [facility] notified and accepted the referral.  Pt with orders for nebulizer machine. Dan with [facility] made aware and will deliver to the room. Pt has oxygen set up at home and has transportation to home.    Expected Discharge Date:                  Expected Discharge Plan:  Home w Home Health Services  In-House Referral:     Discharge planning Services  CM Consult  Post Acute Care Choice:  Durable Medical Equipment, Home Health Choice offered to:  Patient  DME Arranged:  Nebulizer/meds DME Agency:  [facility]  HH Arranged:  RN, Nurse's Aide [facility] Agency:  [facility]  Status of Service:  Completed, signed off  If discussed at Long Length of Stay Meetings, dates discussed:    Additional Comments:  Kermit Balo, RN 12/18/2017, 1:52 PM

## 2017-12-18 NOTE — Progress Notes (Signed)
[place] TEAM 1 - Stepdown/ICU TEAM  [name]  ZOX:[id number] DOB: 01/28/1941 DOA: 12/13/2017 PCP: Maurice Small, MD    Brief Narrative:  77 y.o.F w/ a Hx of Anxiety/Depression, COPD on 2 L O2 via [place] at rest/3 L O2 with exertion, OSA, Afib, Chronic Diastolic CHF, HTN, MV stenosis, CAD S/P CABG, Rheumatic AV disease, pulmonary HTN, Hiatal Hernia, and HLD who presented to the ED w/ shortness of breath felt to be related to a COPD exacerbation.  Significant Events: 5/22 admit  Subjective: The patient is quite anxious at the time of visit and tells me under no circumstances is she rated to go home reporting that she feels ever been short of breath presently as she did when she first came in.  She denies substernal chest pressure nausea vomiting or abdominal pain.  Assessment & Plan:  Acute on chronic hypoxic respiratory failure due to COPD exacerbation Saturations are presently reasonably stable on 3 L nasal cannula but patient is exhibiting increased work of breathing/tachypnea and there is ongoing audible expiratory wheeze - delay discharge home - adjust medical therapy and follow  CK-MB stage III Baseline creatinine approximately 1.3 - crt improved w/ holding of diuretic - cont to hold and follow   Recent Labs  Lab 12/14/17 1852 12/15/17 0802 12/16/17 0416 12/17/17 0353 12/18/17 0330  CREATININE 1.93* 1.38* 1.57* 1.99* 1.54*   Metabolic alkalosis Likely compensation for chronic lung disease, along w/ an acute component due to contraction alkalosis - holding diuretic - follow  Stage III chronic diastolic CHF EF 55% via TTE 3 months ago - appears somewhat dry - holding diuretic   Paroxysmal atrial fibrillation Chadsvasc is 5 - continue Xarelto - rate controlled in NSR today   HTN BP well controlled  CAD status post CABG  Situational depression Patient suffered the death of her daughter recently  DVT prophylaxis: xarelto  Code Status: FULL CODE Family  Communication: no family present at time of exam  Disposition Plan: transfer to tele bed - adjust medical tx - follow serial pulm exam   Consultants:  none  Antimicrobials:  none  Objective: Blood pressure (!) 128/50, pulse (!) 57, temperature 97.6 F (36.4 C), temperature source Oral, resp. rate 20, height 5\' 6"  (1.676 m), weight 87.8 kg (193 lb 8 oz), SpO2 95 %.  Intake/Output Summary (Last 24 hours) at 12/18/2017 1412 Last data filed at 12/18/2017 0900 Gross per 24 hour  Intake 1510 ml  Output 1600 ml  Net -90 ml   Filed Weights   12/16/17 0356 12/17/17 0338 12/18/17 0500  Weight: 87 kg (191 lb 14.4 oz) 87.2 kg (192 lb 3.2 oz) 87.8 kg (193 lb 8 oz)    Examination: General: anxious - tachypneic  Lungs: mild diffuse exp wheeze - no focal crackles  Cardiovascular: Regular rate and rhythm without murmur  Abdomen: Nontender, nondistended, soft, bowel sounds positive, no rebound, no ascites, no appreciable mass Extremities: No significant cyanosis, clubbing, or edema bilateral lower extremities  CBC: Recent Labs  Lab 12/16/17 0416 12/17/17 0353 12/18/17 0330  WBC 12.5* 12.1* 10.1  HGB 10.4* 10.3* 10.1*  HCT 34.9* 34.3* 33.5*  MCV 93.1 92.0 92.3  PLT 243 252 229   Basic Metabolic Panel: Recent Labs  Lab 12/16/17 0416 12/17/17 0353 12/18/17 0330  NA 143 140 143  K 3.6 3.8 3.3*  CL 91* 88* 93*  CO2 39* 41* 40*  GLUCOSE 163* 149* 162*  BUN 40* 51* 50*  CREATININE 1.57* 1.99* 1.54*  CALCIUM 9.6 9.4 9.0  MG 2.2 2.5* 2.2   GFR: Estimated Creatinine Clearance: 33.6 mL/min (A) (by C-G formula based on SCr of 1.54 mg/dL (H)).  Liver Function Tests: No results for input(s): AST, ALT, ALKPHOS, BILITOT, PROT, ALBUMIN in the last 168 hours. No results for input(s): LIPASE, AMYLASE in the last 168 hours. No results for input(s): AMMONIA in the last 168 hours.  Cardiac Enzymes: Recent Labs  Lab 12/13/17 1557 12/14/17 1852 12/15/17 0108 12/15/17 0702  TROPONINI  <0.03 0.03* 0.03* <0.03     Recent Results (from the past 240 hour(s))  MRSA PCR Screening     Status: None   Collection Time: 12/14/17  1:50 AM  Result Value Ref Range Status   MRSA by PCR NEGATIVE NEGATIVE Final    Comment:        The GeneXpert MRSA Assay (FDA approved for NASAL specimens only), is one component of a comprehensive MRSA colonization surveillance program. It is not intended to diagnose MRSA infection nor to guide or monitor treatment for MRSA infections. Performed at [facility], [address], [place], [place] [address]   Respiratory Panel by PCR     Status: None   Collection Time: 12/14/17  9:00 PM  Result Value Ref Range Status   Adenovirus NOT DETECTED NOT DETECTED Final   Coronavirus 229E NOT DETECTED NOT DETECTED Final   Coronavirus HKU1 NOT DETECTED NOT DETECTED Final   Coronavirus NL63 NOT DETECTED NOT DETECTED Final   Coronavirus OC43 NOT DETECTED NOT DETECTED Final   Metapneumovirus NOT DETECTED NOT DETECTED Final   Rhinovirus / Enterovirus NOT DETECTED NOT DETECTED Final   Influenza A NOT DETECTED NOT DETECTED Final   Influenza B NOT DETECTED NOT DETECTED Final   Parainfluenza Virus 1 NOT DETECTED NOT DETECTED Final   Parainfluenza Virus 2 NOT DETECTED NOT DETECTED Final   Parainfluenza Virus 3 NOT DETECTED NOT DETECTED Final   Parainfluenza Virus 4 NOT DETECTED NOT DETECTED Final   Respiratory Syncytial Virus NOT DETECTED NOT DETECTED Final   Bordetella pertussis NOT DETECTED NOT DETECTED Final   Chlamydophila pneumoniae NOT DETECTED NOT DETECTED Final   Mycoplasma pneumoniae NOT DETECTED NOT DETECTED Final    Comment: Performed at [facility], [address], [place], [place] [address]     Scheduled Meds: . amiodarone  200 mg Oral Daily  . amLODipine  2.5 mg Oral Daily  . budesonide (PULMICORT) nebulizer solution  0.25 mg Nebulization BID  . calcium-vitamin D  2 tablet Oral Q breakfast  . diclofenac sodium  2 g Topical TID  AC & HS  . ipratropium-albuterol  3 mL Nebulization TID  . isosorbide mononitrate  60 mg Oral Daily  . mouth rinse  15 mL Mouth Rinse BID  . methylPREDNISolone (SOLU-MEDROL) injection  60 mg Intravenous Q12H  . metoprolol tartrate  50 mg Oral BID  . pantoprazole  80 mg Oral Daily  . pregabalin  25 mg Oral QHS  . Rivaroxaban  15 mg Oral Q supper  . simvastatin  60 mg Oral q1800     LOS: 5 days   Lonia Blood, MD [facility] Office  [phone] Pager - Text Page per Amion as per below:  On-Call/Text Page:      [email or web address]      password [email or web address]  If 7PM-7AM, please contact night-coverage [email or web address] Password [email or web address] 12/18/2017, 2:12 PM

## 2017-12-18 NOTE — Progress Notes (Signed)
Occupational Therapy Treatment Patient Details Name: [name] MRN: [id number] DOB: 02/25/1941 Today's Date: 12/18/2017    History of present illness 77 y.o. WF PMHx Anxiety, depression, COPD on 2 L O2 via [place] at rest/3 L O2 with exertion,, OSA, A. fib on anticoagulation Chronic Diastolic CHF, HTN, MV stenosis, CAD, pulmonary HTN, S/P CABG rheumatic AV disease, Hiatal Hernia, HLD. Presents with worseing SOB, likely COPD exacerbation   OT comments  Pt is progressing steadily. Incorporated energy conservation by sitting on rollator at sink for grooming activities. Pt requiring 3L 02 to maintain Sp02>90% (use of RT's pulse ox).   Follow Up Recommendations  No OT follow up;Supervision/Assistance - 24 hour    Equipment Recommendations  None recommended by OT    Recommendations for Other Services      Precautions / Restrictions Precautions Precautions: Fall Precaution Comments: watch O2 sats       Mobility Bed Mobility               General bed mobility comments: pt sitting on rollator with PT upon arival  Transfers Overall transfer level: Needs assistance Equipment used: Rolling walker (2 wheeled) Transfers: Sit to/from Stand Sit to Stand: Supervision         General transfer comment: for safety and lines    Balance Overall balance assessment: Needs assistance   Sitting balance-Leahy Scale: Good       Standing balance-Leahy Scale: Fair Standing balance comment: statically                           ADL either performed or assessed with clinical judgement   ADL Overall ADL's : Needs assistance/impaired     Grooming: Set up;Sitting;Wash/dry hands;Wash/dry face;Oral care;Brushing hair Grooming Details (indicate cue type and reason): seated on rollator                 Toilet Transfer: Supervision/safety;Ambulation;RW   Toileting- Clothing Manipulation and Hygiene: Supervision/safety;Sit to/from stand       Functional mobility during  ADLs: Rolling walker;Supervision/safety General ADL Comments: pt is knowledgeable in energy conservation strategies.     Vision       Perception     Praxis      Cognition Arousal/Alertness: Awake/alert Behavior During Therapy: WFL for tasks assessed/performed Overall Cognitive Status: Within Functional Limits for tasks assessed                                          Exercises     Shoulder Instructions       General Comments      Pertinent Vitals/ Pain       Pain Assessment: No/denies pain  Home Living                                          Prior Functioning/Environment              Frequency  Min 2X/week        Progress Toward Goals  OT Goals(current goals can now be found in the care plan section)  Progress towards OT goals: Progressing toward goals  Acute Rehab OT Goals Patient Stated Goal: return home, breathe better OT Goal Formulation: With patient Time For Goal Achievement: 12/30/17 Potential to Achieve Goals:  Good  Plan Discharge plan remains appropriate    Co-evaluation                 AM-PAC PT "6 Clicks" Daily Activity     Outcome Measure   Help from another person eating meals?: None Help from another person taking care of personal grooming?: A Little Help from another person toileting, which includes using toliet, bedpan, or urinal?: A Little Help from another person bathing (including washing, rinsing, drying)?: A Little Help from another person to put on and taking off regular upper body clothing?: None Help from another person to put on and taking off regular lower body clothing?: A Little 6 Click Score: 20    End of Session Equipment Utilized During Treatment: Gait belt;Rolling walker;Oxygen  OT Visit Diagnosis: Unsteadiness on feet (R26.81);Other abnormalities of gait and mobility (R26.89);Muscle weakness (generalized) (M62.81)   Activity Tolerance Patient tolerated treatment  well   Patient Left in chair;with call bell/phone within reach;Other (comment)(RT)   Nurse Communication          Time: [phone] OT Time Calculation (min): 34 min  Charges: OT General Charges $OT Visit: 1 Visit OT Treatments $Self Care/Home Management : 23-37 mins  12/18/2017 Martie Round, OTR/L Pager: [phone]   Iran Planas [place] B[facility]iff 12/18/2017, 1:07 PM

## 2017-12-19 ENCOUNTER — Inpatient Hospital Stay (HOSPITAL_COMMUNITY): Payer: Medicare Other

## 2017-12-19 LAB — MAGNESIUM: MAGNESIUM: 2.6 mg/dL — AB (ref 1.7–2.4)

## 2017-12-19 LAB — COMPREHENSIVE METABOLIC PANEL
ALT: 31 U/L (ref 14–54)
AST: 24 U/L (ref 15–41)
Albumin: 2.8 g/dL — ABNORMAL LOW (ref 3.5–5.0)
Alkaline Phosphatase: 70 U/L (ref 38–126)
Anion gap: 8 (ref 5–15)
BUN: 46 mg/dL — ABNORMAL HIGH (ref 6–20)
CALCIUM: 9.2 mg/dL (ref 8.9–10.3)
CHLORIDE: 96 mmol/L — AB (ref 101–111)
CO2: 39 mmol/L — ABNORMAL HIGH (ref 22–32)
CREATININE: 1.16 mg/dL — AB (ref 0.44–1.00)
GFR, EST AFRICAN AMERICAN: 51 mL/min — AB (ref >60)
GFR, EST NON AFRICAN AMERICAN: 44 mL/min — AB (ref >60)
Glucose, Bld: 133 mg/dL — ABNORMAL HIGH (ref 65–99)
Potassium: 4.4 mmol/L (ref 3.5–5.1)
Sodium: 143 mmol/L (ref 135–145)
TOTAL PROTEIN: 6.5 g/dL (ref 6.5–8.1)
Total Bilirubin: 0.7 mg/dL (ref 0.3–1.2)

## 2017-12-19 MED ORDER — CYANOCOBALAMIN 1000 MCG/ML IJ SOLN
1000.0000 ug | Freq: Once | INTRAMUSCULAR | Status: AC
  Administered 2017-12-19: 1000 ug via SUBCUTANEOUS
  Filled 2017-12-19: qty 1

## 2017-12-19 MED ORDER — DOXYCYCLINE HYCLATE 100 MG PO TABS
100.0000 mg | ORAL_TABLET | Freq: Two times a day (BID) | ORAL | Status: AC
  Administered 2017-12-19 – 2017-12-25 (×12): 100 mg via ORAL
  Filled 2017-12-19 (×12): qty 1

## 2017-12-19 MED ORDER — ALBUTEROL SULFATE (2.5 MG/3ML) 0.083% IN NEBU: 2.5000 mg | INHALATION_SOLUTION | RESPIRATORY_TRACT | Status: DC | PRN

## 2017-12-19 MED ORDER — CLONAZEPAM 0.5 MG PO TABS
0.2500 mg | ORAL_TABLET | Freq: Three times a day (TID) | ORAL | Status: DC | PRN
  Administered 2017-12-20 – 2017-12-23 (×5): 0.25 mg via ORAL
  Filled 2017-12-19 (×12): qty 1

## 2017-12-19 MED ORDER — CLONAZEPAM 0.5 MG PO TABS: 0.2500 mg | ORAL_TABLET | Freq: Three times a day (TID) | ORAL | Status: DC | PRN

## 2017-12-19 MED ORDER — METHYLPREDNISOLONE SODIUM SUCC 125 MG IJ SOLR
60.0000 mg | Freq: Two times a day (BID) | INTRAMUSCULAR | Status: DC
  Administered 2017-12-19 – 2017-12-20 (×2): 60 mg via INTRAVENOUS
  Filled 2017-12-19 (×2): qty 2

## 2017-12-19 MED ORDER — FERROUS GLUCONATE 324 (38 FE) MG PO TABS
324.0000 mg | ORAL_TABLET | Freq: Two times a day (BID) | ORAL | Status: DC
  Administered 2017-12-20 – 2017-12-26 (×12): 324 mg via ORAL
  Filled 2017-12-19 (×14): qty 1

## 2017-12-19 MED ORDER — DOXYCYCLINE HYCLATE 100 MG PO TABS: 100.0000 mg | ORAL_TABLET | Freq: Two times a day (BID) | ORAL | Status: DC

## 2017-12-19 NOTE — Plan of Care (Signed)
  Problem: Health Behavior/Discharge Planning: Goal: Ability to manage health-related needs will improve Outcome: Progressing   

## 2017-12-19 NOTE — Progress Notes (Signed)
Physical Therapy Treatment Patient Details Name: [name] MRN: [id number] DOB: May 03, 1941 Today's Date: 12/19/2017    History of Present Illness 77 y.o. WF PMHx Anxiety, depression, COPD on 2 L O2 via [place] at rest/3 L O2 with exertion,, OSA, A. fib on anticoagulation Chronic Diastolic CHF, HTN, MV stenosis, CAD, pulmonary HTN, S/P CABG rheumatic AV disease, Hiatal Hernia, HLD. Presents with worseing SOB, likely COPD exacerbation    PT Comments    Pt admitted with above diagnosis. Pt currently with functional limitations due to the deficits listed below (see PT Problem List). Pt was able to ambulate with RW with good safety.  No LOB and sats >94% on 4L O2.  HAd to incr from 2.5 - 4LO2 for activity.  Pt will benefit from skilled PT to increase their independence and safety with mobility to allow discharge to the venue listed below.     Follow Up Recommendations  No PT follow up;Other (comment)(HHaide)     Equipment Recommendations  None recommended by PT    Recommendations for Other Services       Precautions / Restrictions Precautions Precautions: Fall Precaution Comments: watch O2 sats Restrictions Weight Bearing Restrictions: No    Mobility  Bed Mobility Overal bed mobility: Independent                Transfers Overall transfer level: Needs assistance Equipment used: Rolling walker (2 wheeled) Transfers: Sit to/from Stand Sit to Stand: Supervision Stand pivot transfers: Supervision       General transfer comment: for safety and lines  Ambulation/Gait Ambulation/Gait assistance: Min guard;Supervision Ambulation Distance (Feet): 225 Feet Assistive device: Rolling walker (2 wheeled) Gait Pattern/deviations: Step-through pattern;Trunk flexed;Decreased stride length Gait velocity: decreased Gait velocity interpretation: <1.31 ft/sec, indicative of household ambulator General Gait Details: Pt amb on 4L of O2 to keep sats 90%. cues to stay close to RW.     Stairs             Wheelchair Mobility    Modified Rankin (Stroke Patients Only)       Balance Overall balance assessment: Needs assistance Sitting-balance support: No upper extremity supported Sitting balance-Leahy Scale: Good     Standing balance support: No upper extremity supported;During functional activity Standing balance-Leahy Scale: Fair Standing balance comment: statically                            Cognition Arousal/Alertness: Awake/alert Behavior During Therapy: WFL for tasks assessed/performed Overall Cognitive Status: Within Functional Limits for tasks assessed                                        Exercises      General Comments        Pertinent Vitals/Pain Pain Assessment: No/denies pain    Home Living                      Prior Function            PT Goals (current goals can now be found in the care plan section) Acute Rehab PT Goals Patient Stated Goal: return home, breathe better Progress towards PT goals: Progressing toward goals    Frequency    Min 3X/week      PT Plan Current plan remains appropriate    Co-evaluation  AM-PAC PT "6 Clicks" Daily Activity  Outcome Measure  Difficulty turning over in bed (including adjusting bedclothes, sheets and blankets)?: None Difficulty moving from lying on back to sitting on the side of the bed? : A Little Difficulty sitting down on and standing up from a chair with arms (e.g., wheelchair, bedside commode, etc,.)?: A Little Help needed moving to and from a bed to chair (including a wheelchair)?: A Little Help needed walking in hospital room?: A Little Help needed climbing 3-5 steps with a railing? : A Lot 6 Click Score: 18    End of Session Equipment Utilized During Treatment: Gait belt;Oxygen Activity Tolerance: Patient tolerated treatment well   Nurse Communication: Mobility status PT Visit Diagnosis: Unsteadiness  on feet (R26.81);Difficulty in walking, not elsewhere classified (R26.2);Muscle weakness (generalized) (M62.81)     Time: 4975-3005 PT Time Calculation (min) (ACUTE ONLY): 31 min  Charges:  $Gait Training: 23-37 mins                    G Codes:       Anitra Doxtater,PT Acute Rehabilitation [phone]   Berline Lopes 12/19/2017, 12:21 PM

## 2017-12-19 NOTE — Plan of Care (Signed)
  Problem: Elimination: Goal: Will not experience complications related to bowel motility Outcome: Progressing   

## 2017-12-19 NOTE — Progress Notes (Addendum)
[place] TEAM 1 - Stepdown/ICU TEAM  [name]  QIO:[id number] DOB: August 17, 1940 DOA: 12/13/2017 PCP: Maurice Small, MD    Brief Narrative:  77 y.o.F w/ a Hx of Anxiety/Depression, COPD on 2 L O2 via [place] at rest/3 L O2 with exertion, OSA, Afib, Chronic Diastolic CHF, HTN, MV stenosis, CAD S/P CABG, Rheumatic AV disease, pulmonary HTN, Hiatal Hernia, and HLD who presented to the ED w/ shortness of breath felt to be related to a COPD exacerbation.  Significant Events: 5/22 admit  Subjective: Pt feels that she is still not back to her breathing baseline.  Her RN reports that she was in a good bit of distress earlier this morning, but has improved th/o the day.  Her CXR this AM suggested possible B LL infiltrates.  She did have a low grade elevated temp yesterday evening.  She denies cp, n/v, or abdom pain.    Assessment & Plan:  Acute on chronic hypoxic respiratory failure due to COPD exacerbation and possible PNA Saturations are presently reasonably stable on 2-3 L nasal cannula when at rest, but w/ just conversation she quickly drops into the low 80s - I am not fully convinced she has an infectious PNA, but given her failure to improve fully I will initiate a course of abx   CK-MB stage III Baseline creatinine approximately 1.3 - crt much improved w/ holding of diuretic   Recent Labs  Lab 12/15/17 0802 12/16/17 0416 12/17/17 0353 12/18/17 0330 12/19/17 0723  CREATININE 1.38* 1.57* 1.99* 1.54* 1.16*   Metabolic alkalosis Likely compensation for chronic lung disease, along w/ an acute component due to contraction alkalosis - currently off diuretic w/ stable bicarb   Stage III chronic diastolic CHF EF 55% via TTE 3 months ago - holding diuretic - weight climbing but pt does not appear signif overloaded on exam   Filed Weights   12/17/17 0338 12/18/17 0500 12/19/17 0447  Weight: 87.2 kg (192 lb 3.2 oz) 87.8 kg (193 lb 8 oz) 89.2 kg (196 lb 11.2 oz)    Paroxysmal atrial  fibrillation Chadsvasc is 5 - continue Xarelto - rate controlled in NSR presently   Normocytic anemia Despite normocytic nature of anemia, pt has an Fe def as noted on labs 5/23 - will need outpt w/u to include colonscopy if not up to date w/ same - will check stool guiaic for now - add Fe tx orally   HTN BP well controlled  CAD status post CABG  Situational depression Patient suffered the death of her daughter recently  DVT prophylaxis: xarelto  Code Status: FULL CODE Family Communication: no family present at time of exam  Disposition Plan: transfer to tele bed - adjust medical tx - follow serial pulm exam   Consultants:  none  Antimicrobials:  none  Objective: Blood pressure 135/70, pulse (!) 53, temperature 98 F (36.7 C), temperature source Oral, resp. rate 18, height 5\' 6"  (1.676 m), weight 89.2 kg (196 lb 11.2 oz), SpO2 100 %.  Intake/Output Summary (Last 24 hours) at 12/19/2017 1446 Last data filed at 12/19/2017 1302 Gross per 24 hour  Intake 2493.33 ml  Output 1650 ml  Net 843.33 ml   Filed Weights   12/17/17 0338 12/18/17 0500 12/19/17 0447  Weight: 87.2 kg (192 lb 3.2 oz) 87.8 kg (193 lb 8 oz) 89.2 kg (196 lb 11.2 oz)    Examination: General: alert - not anxious - SOB w/ conversation  Lungs: no wheezing - bibasilar mild crackles  Cardiovascular: RRR - no M Abdomen: NT/ND, soft, bowel sounds positive Extremities: trace edema B LE   CBC: Recent Labs  Lab 12/16/17 0416 12/17/17 0353 12/18/17 0330  WBC 12.5* 12.1* 10.1  HGB 10.4* 10.3* 10.1*  HCT 34.9* 34.3* 33.5*  MCV 93.1 92.0 92.3  PLT 243 252 229   Basic Metabolic Panel: Recent Labs  Lab 12/17/17 0353 12/18/17 0330 12/19/17 0723  NA 140 143 143  K 3.8 3.3* 4.4  CL 88* 93* 96*  CO2 41* 40* 39*  GLUCOSE 149* 162* 133*  BUN 51* 50* 46*  CREATININE 1.99* 1.54* 1.16*  CALCIUM 9.4 9.0 9.2  MG 2.5* 2.2 2.6*   GFR: Estimated Creatinine Clearance: 45 mL/min (A) (by C-G formula based on SCr  of 1.16 mg/dL (H)).  Liver Function Tests: Recent Labs  Lab 12/19/17 0723  AST 24  ALT 31  ALKPHOS 70  BILITOT 0.7  PROT 6.5  ALBUMIN 2.8*    Cardiac Enzymes: Recent Labs  Lab 12/13/17 1557 12/14/17 1852 12/15/17 0108 12/15/17 0702  TROPONINI <0.03 0.03* 0.03* <0.03     Recent Results (from the past 240 hour(s))  MRSA PCR Screening     Status: None   Collection Time: 12/14/17  1:50 AM  Result Value Ref Range Status   MRSA by PCR NEGATIVE NEGATIVE Final    Comment:        The GeneXpert MRSA Assay (FDA approved for NASAL specimens only), is one component of a comprehensive MRSA colonization surveillance program. It is not intended to diagnose MRSA infection nor to guide or monitor treatment for MRSA infections. Performed at [facility], [address], [place], [place] [address]   Respiratory Panel by PCR     Status: None   Collection Time: 12/14/17  9:00 PM  Result Value Ref Range Status   Adenovirus NOT DETECTED NOT DETECTED Final   Coronavirus 229E NOT DETECTED NOT DETECTED Final   Coronavirus HKU1 NOT DETECTED NOT DETECTED Final   Coronavirus NL63 NOT DETECTED NOT DETECTED Final   Coronavirus OC43 NOT DETECTED NOT DETECTED Final   Metapneumovirus NOT DETECTED NOT DETECTED Final   Rhinovirus / Enterovirus NOT DETECTED NOT DETECTED Final   Influenza A NOT DETECTED NOT DETECTED Final   Influenza B NOT DETECTED NOT DETECTED Final   Parainfluenza Virus 1 NOT DETECTED NOT DETECTED Final   Parainfluenza Virus 2 NOT DETECTED NOT DETECTED Final   Parainfluenza Virus 3 NOT DETECTED NOT DETECTED Final   Parainfluenza Virus 4 NOT DETECTED NOT DETECTED Final   Respiratory Syncytial Virus NOT DETECTED NOT DETECTED Final   Bordetella pertussis NOT DETECTED NOT DETECTED Final   Chlamydophila pneumoniae NOT DETECTED NOT DETECTED Final   Mycoplasma pneumoniae NOT DETECTED NOT DETECTED Final    Comment: Performed at [facility], [address],  [place], [place] [address]     Scheduled Meds: . amiodarone  200 mg Oral Daily  . amLODipine  2.5 mg Oral Daily  . budesonide (PULMICORT) nebulizer solution  0.25 mg Nebulization BID  . calcium-vitamin D  2 tablet Oral Q breakfast  . diclofenac sodium  2 g Topical TID AC & HS  . ipratropium-albuterol  3 mL Nebulization TID  . isosorbide mononitrate  60 mg Oral Daily  . mouth rinse  15 mL Mouth Rinse BID  . methylPREDNISolone (SOLU-MEDROL) injection  80 mg Intravenous Q12H  . metoprolol tartrate  50 mg Oral BID  . pantoprazole  80 mg Oral Daily  .  pregabalin  25 mg Oral QHS  . Rivaroxaban  15 mg Oral Q supper  . simvastatin  60 mg Oral q1800     LOS: 6 days   Lonia Blood, MD [facility] Office  [phone] Pager - Text Page per Amion as per below:  On-Call/Text Page:      [email or web address]      password [email or web address]  If 7PM-7AM, please contact night-coverage [email or web address] Password [facility] 12/19/2017, 2:46 PM

## 2017-12-20 ENCOUNTER — Encounter (HOSPITAL_COMMUNITY): Payer: Medicare Other

## 2017-12-20 MED ORDER — METHYLPREDNISOLONE SODIUM SUCC 125 MG IJ SOLR
60.0000 mg | INTRAMUSCULAR | Status: DC
  Administered 2017-12-21: 60 mg via INTRAVENOUS
  Filled 2017-12-20: qty 2

## 2017-12-20 NOTE — Discharge Instructions (Addendum)
Information on my medicine - XARELTO® (Rivaroxaban) ° °This medication education was reviewed with me or my healthcare representative as part of my discharge preparation.   ° °Why was Xarelto® prescribed for you? °Xarelto® was prescribed for you to reduce the risk of a blood clot forming that can cause a stroke if you have a medical condition called atrial fibrillation (a type of irregular heartbeat). ° °What do you need to know about xarelto® ? °Take your Xarelto® ONCE DAILY at the same time every day with your evening meal. °If you have difficulty swallowing the tablet whole, you may crush it and mix in applesauce just prior to taking your dose. ° °Take Xarelto® exactly as prescribed by your doctor and DO NOT stop taking Xarelto® without talking to the doctor who prescribed the medication.  Stopping without other stroke prevention medication to take the place of Xarelto® may increase your risk of developing a clot that causes a stroke.  Refill your prescription before you run out. ° °After discharge, you should have regular check-up appointments with your healthcare provider that is prescribing your Xarelto®.  In the future your dose may need to be changed if your kidney function or weight changes by a significant amount. ° °What do you do if you miss a dose? °If you are taking Xarelto® ONCE DAILY and you miss a dose, take it as soon as you remember on the same day then continue your regularly scheduled once daily regimen the next day. Do not take two doses of Xarelto® at the same time or on the same day.  ° °Important Safety Information °A possible side effect of Xarelto® is bleeding. You should call your healthcare provider right away if you experience any of the following: °? Bleeding from an injury or your nose that does not stop. °? Unusual colored urine (red or dark brown) or unusual colored stools (red or black). °? Unusual bruising for unknown reasons. °? A serious fall or if you hit your head (even if  there is no bleeding). ° °Some medicines may interact with Xarelto® and might increase your risk of bleeding while on Xarelto®. To help avoid this, consult your healthcare provider or pharmacist prior to using any new prescription or non-prescription medications, including herbals, vitamins, non-steroidal anti-inflammatory drugs (NSAIDs) and supplements. ° °This website has more information on Xarelto®: [email or web address].com. ° ° ° °Community-Acquired Pneumonia, Adult °Pneumonia is an infection of the lungs. One type of pneumonia can happen while a person is in a hospital. A different type can happen when a person is not in a hospital (community-acquired pneumonia). It is easy for this kind to spread from person to person. It can spread to you if you breathe near an infected person who coughs or sneezes. Some symptoms include: °· A dry cough. °· A wet (productive) cough. °· Fever. °· Sweating. °· Chest pain. ° °Follow these instructions at home: °· Take over-the-counter and prescription medicines only as told by your doctor. °? Only take cough medicine if you are losing sleep. °? If you were prescribed an antibiotic medicine, take it as told by your doctor. Do not stop taking the antibiotic even if you start to feel better. °· Sleep with your head and neck raised (elevated). You can do this by putting a few pillows under your head, or you can sleep in a recliner. °· Do not use tobacco products. These include cigarettes, chewing tobacco, and e-cigarettes. If you need help quitting, ask your doctor. °· Drink enough water   to keep your pee (urine) clear or pale yellow. °A shot (vaccine) can help prevent pneumonia. Shots are often suggested for: °· People older than 78 years of age. °· People older than 78 years of age: °? Who are having cancer treatment. °? Who have long-term (chronic) lung disease. °? Who have problems with their body's defense system (immune system). ° °You may also prevent pneumonia if you take these  actions: °· Get the flu (influenza) shot every year. °· Go to the dentist as often as told. °· Wash your hands often. If soap and water are not available, use hand sanitizer. ° °Contact a doctor if: °· You have a fever. °· You lose sleep because your cough medicine does not help. °Get help right away if: °· You are short of breath and it gets worse. °· You have more chest pain. °· Your sickness gets worse. This is very serious if: °? You are an older adult. °? Your body's defense system is weak. °· You cough up blood. °This information is not intended to replace advice given to you by your health care provider. Make sure you discuss any questions you have with your health care provider. °Document Released: 12/28/2007 Document Revised: 12/17/2015 Document Reviewed: 11/05/2014 °Elsevier Interactive Patient Education © 2018 [employer] ° °Heart Failure and Exercise °Heart failure is a condition in which the heart does not fill or pump enough blood and oxygen to support your body and its functions. Heart failure is a long-term (chronic) condition. Living with heart failure can be challenging. However, following your health care provider's instructions about a healthy lifestyle may help improve your symptoms. This includes choosing the right exercise plan. °Doing daily physical activity is important after a diagnosis of heart failure. You may have some activity restrictions, so talk to your health care provider before doing any exercises. °What are the benefits of exercise? °Exercise may: °· Make your heart muscles stronger. °· Lower your blood pressure. °· Lower your cholesterol. °· Help you lose weight. °· Help your bones stay strong. °· Improve your blood circulation. °· Help your body use oxygen better. This relieves symptoms such as fatigue and shortness of breath. °· Help your mental health by lowering the risk of depression and other problems. °· Improve your quality of life. °· Decrease your chance of hospital  admission for heart failure. ° °What is an exercise plan? °An exercise plan is a set of specific exercises and training activities. You will work with your health care provider to create the exercise plan that works for you. The plan may include: °· Different types of exercises and how to do them. °· Cardiac rehabilitation exercises. These are supervised programs that are designed to strengthen your heart. ° °What are strengthening exercises? °Strengthening exercises are a type of physical activity that involves using resistance to improve your muscle strength. Strengthening exercises usually have repetitive motions. These types of exercises can include: °· Lifting weights. °· Using weight machines. °· Using resistance tubes and bands. °· Using kettlebells. °· Using your body weight, such as doing push-ups or squats. ° °What are balance exercises? °Balance exercises are another type of physical activity. They strengthen the muscles of the back, stomach, and pelvis (core muscles) and improve your balance. They can also lower your risk of falling. These types of exercises can include: °· Standing on one leg. °· Walking backward, sideways, and in a straight line. °· Standing up after sitting, without using your hands. °· Shifting your weight from   one leg to the other.  Lifting one leg in front of you.  Doing tai chi. This is a type of exercise that uses slow movements and deep breathing.  How can I increase my flexibility? Having better flexibility can keep you from falling. It can also lengthen your muscles, improve your range of motion, and help your joints. You can increase your flexibility by:  Doing tai chi.  Doing yoga.  Stretching.  How much aerobic exercise should I get? Aerobic exercises strengthen your breathing and circulation system and increase your body's use of oxygen. Examples of aerobic exercise include biking, walking, running, and swimming. Talk to your health care provider to find  out how much aerobic exercise is safe for you.  To do these exercises:  Start exercising slowly, limiting the amount of time at first. You may need to start with 5 minutes of aerobic exercise every day.  Slowly add more minutes until you can safely do at least 30 minutes of exercise at least 4 days a week.  Summary  Daily physical activity is important after a diagnosis of heart failure.  Exercise can make your heart muscles stronger. It also offers other benefits that will improve your health.  Talk to your health care provider before doing any exercises. This information is not intended to replace advice given to you by your health care provider. Make sure you discuss any questions you have with your health care provider. Document Released: 11/22/2016 Document Revised: 11/22/2016 Document Reviewed: 11/22/2016 Elsevier Interactive Patient Education  2018 [employer].    Urinary Tract Infection, Adult A urinary tract infection (UTI) is an infection of any part of the urinary tract. The urinary tract includes the:  Kidneys.  Ureters.  Bladder.  Urethra.  These organs make, store, and get rid of pee (urine) in the body. Follow these instructions at home:  Take over-the-counter and prescription medicines only as told by your doctor.  If you were prescribed an antibiotic medicine, take it as told by your doctor. Do not stop taking the antibiotic even if you start to feel better.  Avoid the following drinks: ? Alcohol. ? Caffeine. ? Tea. ? Carbonated drinks.  Drink enough fluid to keep your pee clear or pale yellow.  Keep all follow-up visits as told by your doctor. This is important.  Make sure to: ? Empty your bladder often and completely. Do not to hold pee for long periods of time. ? Empty your bladder before and after sex. ? Wipe from front to back after a bowel movement if you are female. Use each tissue one time when you wipe. Contact a doctor if:  You have  back pain.  You have a fever.  You feel sick to your stomach (nauseous).  You throw up (vomit).  Your symptoms do not get better after 3 days.  Your symptoms go away and then come back. Get help right away if:  You have very bad back pain.  You have very bad lower belly (abdominal) pain.  You are throwing up and cannot keep down any medicines or water. This information is not intended to replace advice given to you by your health care provider. Make sure you discuss any questions you have with your health care provider. Document Released: 12/28/2007 Document Revised: 12/17/2015 Document Reviewed: 06/01/2015 Elsevier Interactive Patient Education  2018 [employer] Inc.    Chronic Obstructive Pulmonary Disease Chronic obstructive pulmonary disease (COPD) is a long-term (chronic) lung problem. When you have COPD, it  is hard for air to get in and out of your lungs. The way your lungs work will never return to normal. Usually the condition gets worse over time. There are things you can do to keep yourself as healthy as possible. Your doctor may treat your condition with:  Medicines.  Quitting smoking, if you smoke.  Rehabilitation. This may involve a team of specialists.  Oxygen.  Exercise and changes to your diet.  Lung surgery.  Comfort measures (palliative care).  Follow these instructions at home: Medicines  Take over-the-counter and prescription medicines only as told by your doctor.  Talk to your doctor before taking any cough or allergy medicines. You may need to avoid medicines that cause your lungs to be dry. Lifestyle  If you smoke, stop. Smoking makes the problem worse. If you need help quitting, ask your doctor.  Avoid being around things that make your breathing worse. This may include smoke, chemicals, and fumes.  Stay active, but remember to also rest.  Learn and use tips on how to relax.  Make sure you get enough sleep. Most adults need at least 7  hours a night.  Eat healthy foods. Eat smaller meals more often. Rest before meals. Controlled breathing  Learn and use tips on how to control your breathing as told by your doctor. Try: ? Breathing in (inhaling) through your nose for 1 second. Then, pucker your lips and breath out (exhale) through your lips for 2 seconds. ? Putting one hand on your belly (abdomen). Breathe in slowly through your nose for 1 second. Your hand on your belly should move out. Pucker your lips and breathe out slowly through your lips. Your hand on your belly should move in as you breathe out. Controlled coughing  Learn and use controlled coughing to clear mucus from your lungs. The steps are: 1. Lean your head a little forward. 2. Breathe in deeply. 3. Try to hold your breath for 3 seconds. 4. Keep your mouth slightly open while coughing 2 times. 5. Spit any mucus out into a tissue. 6. Rest and do the steps again 1 or 2 times as needed. General instructions  Make sure you get all the shots (vaccines) that your doctor recommends. Ask your doctor about a flu shot and a pneumonia shot.  Use oxygen therapy and therapy to help improve your lungs (pulmonary rehabilitation) if told by your doctor. If you need home oxygen therapy, ask your doctor if you should buy a tool to measure your oxygen level (oximeter).  Make a COPD action plan with your doctor. This helps you know what to do if you feel worse than usual.  Manage any other conditions you have as told by your doctor.  Avoid going outside when it is very hot, cold, or humid.  Avoid people who have a sickness you can catch (contagious).  Keep all follow-up visits as told by your doctor. This is important. Contact a doctor if:  You cough up more mucus than usual.  There is a change in the color or thickness of the mucus.  It is harder to breathe than usual.  Your breathing is faster than usual.  You have trouble sleeping.  You need to use your  medicines more often than usual.  You have trouble doing your normal activities such as getting dressed or walking around the house. Get help right away if:  You have shortness of breath while resting.  You have shortness of breath that stops you from: ?  Being able to talk. ? Doing normal activities.  Your chest hurts for longer than 5 minutes.  Your skin color is more blue than usual.  Your pulse oximeter shows that you have low oxygen for longer than 5 minutes.  You have a fever.  You feel too tired to breathe normally. Summary  Chronic obstructive pulmonary disease (COPD) is a long-term lung problem.  The way your lungs work will never return to normal. Usually the condition gets worse over time. There are things you can do to keep yourself as healthy as possible.  Take over-the-counter and prescription medicines only as told by your doctor.  If you smoke, stop. Smoking makes the problem worse. This information is not intended to replace advice given to you by your health care provider. Make sure you discuss any questions you have with your health care provider. Document Released: 12/28/2007 Document Revised: 12/17/2015 Document Reviewed: 03/07/2013 Elsevier Interactive Patient Education  2017 [employer].

## 2017-12-20 NOTE — Care Management Important Message (Signed)
Important Message  Patient Details  Name: [name] MRN: [id number] Date of Birth: [birth year]   Medicare Important Message Given:  Yes    Chidi Shirer P Jaymeson Mengel 12/20/2017, 2:11 PM

## 2017-12-20 NOTE — Progress Notes (Signed)
PROGRESS NOTE    [name]  RUE:[id number] DOB: 1941/03/29 DOA: 12/13/2017 PCP: Maurice Small, MD     Brief Narrative:  [name] is a 77 y.o.female with past medical hx of anxiety/depression, COPD on 2 L O2 via [place] at rest/3 L O2 with exertion, OSA, Afib, chronic diastolic CHF, HTN, MV stenosis, CAD S/P CABG, rheumatic AV disease, pulmonary HTN, hiatal hernia, and HLD who presented to the ED w/ shortness of breath felt to be related to a COPD exacerbation.  New events last 24 hours / Subjective: No acute events overnight.  Continues to have some shortness of breath even at rest.  Assessment & Plan:   Principal Problem:   Acute on chronic respiratory failure (HCC) Active Problems:   Coronary atherosclerosis   COPD exacerbation (HCC)   Chronic diastolic heart failure (HCC)   HTN (hypertension)   Cardiomyopathy (HCC)   Atrial fibrillation (HCC)   Mitral valve stenosis   Metabolic alkalosis   Acute on chronic hypoxic respiratory failure secondary to COPD exacerbation and right basilar pneumonia -Patient on 2-3L nasal cannula O2 at baseline -Continue Solu-Medrol, will decrease dose -Continue doxycycline -Resp viral panel negative   Chronic kidney disease stage III -Baseline creatinine approximately 1.3 -Stable  Chronic diastolic heart failure -Does not appear significantly fluid overloaded on examination  Paroxysmal atrial fibrillation -CHADSVASc score is 5 -Continue Xarelto, amiodarone  -Currently bradycardic, regular rhythm   Essential hypertension -Continue Norvasc, lopressor   CAD status post CABG -Stable. Continue imdur, zocor   Depression/anxiety -Continue Klonopin   DVT prophylaxis: Xarelto Code Status: Partial code Family Communication: No family at bedside  Disposition Plan: Pending improvement in respiratory status   Consultants:   None  Procedures:   None  Antimicrobials:  Anti-infectives (From admission, onward)   Start      Dose/Rate Route Frequency Ordered Stop   12/19/17 2200  doxycycline (VIBRA-TABS) tablet 100 mg  Status:  Discontinued     100 mg Oral Every 12 hours 12/19/17 1621 12/19/17 1857   12/19/17 2200  doxycycline (VIBRA-TABS) tablet 100 mg     100 mg Oral Every 12 hours 12/19/17 1857         Objective: Vitals:   12/20/17 0751 12/20/17 0904 12/20/17 0910 12/20/17 1435  BP:  (!) 116/52 (!) 116/52   Pulse:  60    Resp:      Temp:      TempSrc:      SpO2: 94%   96%  Weight:      Height:        Intake/Output Summary (Last 24 hours) at 12/20/2017 1443 Last data filed at 12/20/2017 1352 Gross per 24 hour  Intake 1080 ml  Output 700 ml  Net 380 ml   Filed Weights   12/18/17 0500 12/19/17 0447 12/20/17 0500  Weight: 87.8 kg (193 lb 8 oz) 89.2 kg (196 lb 11.2 oz) 88.8 kg (195 lb 12.8 oz)    Examination:  General exam: Appears calm and comfortable  Respiratory system: Clear to auscultation with minimal wheezes. Respiratory effort normal. Cardiovascular system: S1 & S2 heard, regular rhythm rate 50s. No JVD, murmurs, rubs, gallops or clicks. No pedal edema. Gastrointestinal system: Abdomen is nondistended, soft and nontender. No organomegaly or masses felt. Normal bowel sounds heard. Central nervous system: Alert and oriented. No focal neurological deficits. Extremities: Symmetric 5 x 5 power. Skin: No rashes, lesions or ulcers Psychiatry: Judgement and insight appear normal. Mood & affect appropriate.   Data Reviewed:  I have personally reviewed following labs and imaging studies  CBC: Recent Labs  Lab 12/14/17 0333 12/15/17 0702 12/16/17 0416 12/17/17 0353 12/18/17 0330  WBC 9.3 16.6* 12.5* 12.1* 10.1  HGB 9.5* 10.0* 10.4* 10.3* 10.1*  HCT 31.4* 33.5* 34.9* 34.3* 33.5*  MCV 90.8 93.1 93.1 92.0 92.3  PLT 214 235 243 252 229   Basic Metabolic Panel: Recent Labs  Lab 12/15/17 0702 12/15/17 0802 12/16/17 0416 12/17/17 0353 12/18/17 0330 12/19/17 0723  NA  --  141 143 140  143 143  K  --  4.2 3.6 3.8 3.3* 4.4  CL  --  94* 91* 88* 93* 96*  CO2  --  33* 39* 41* 40* 39*  GLUCOSE  --  111* 163* 149* 162* 133*  BUN  --  36* 40* 51* 50* 46*  CREATININE  --  1.38* 1.57* 1.99* 1.54* 1.16*  CALCIUM  --  9.9 9.6 9.4 9.0 9.2  MG 2.4  --  2.2 2.5* 2.2 2.6*   GFR: Estimated Creatinine Clearance: 44.9 mL/min (A) (by C-G formula based on SCr of 1.16 mg/dL (H)). Liver Function Tests: Recent Labs  Lab 12/19/17 0723  AST 24  ALT 31  ALKPHOS 70  BILITOT 0.7  PROT 6.5  ALBUMIN 2.8*   No results for input(s): LIPASE, AMYLASE in the last 168 hours. No results for input(s): AMMONIA in the last 168 hours. Coagulation Profile: No results for input(s): INR, PROTIME in the last 168 hours. Cardiac Enzymes: Recent Labs  Lab 12/13/17 1557 12/14/17 1852 12/15/17 0108 12/15/17 0702  TROPONINI <0.03 0.03* 0.03* <0.03   BNP (last 3 results) No results for input(s): PROBNP in the last 8760 hours. HbA1C: No results for input(s): HGBA1C in the last 72 hours. CBG: No results for input(s): GLUCAP in the last 168 hours. Lipid Profile: No results for input(s): CHOL, HDL, LDLCALC, TRIG, CHOLHDL, LDLDIRECT in the last 72 hours. Thyroid Function Tests: No results for input(s): TSH, T4TOTAL, FREET4, T3FREE, THYROIDAB in the last 72 hours. Anemia Panel: No results for input(s): VITAMINB12, FOLATE, FERRITIN, TIBC, IRON, RETICCTPCT in the last 72 hours. Sepsis Labs: No results for input(s): PROCALCITON, LATICACIDVEN in the last 168 hours.  Recent Results (from the past 240 hour(s))  MRSA PCR Screening     Status: None   Collection Time: 12/14/17  1:50 AM  Result Value Ref Range Status   MRSA by PCR NEGATIVE NEGATIVE Final    Comment:        The GeneXpert MRSA Assay (FDA approved for NASAL specimens only), is one component of a comprehensive MRSA colonization surveillance program. It is not intended to diagnose MRSA infection nor to guide or monitor treatment for MRSA  infections. Performed at [facility], [address] [place], [place] [address]   Respiratory Panel by PCR     Status: None   Collection Time: 12/14/17  9:00 PM  Result Value Ref Range Status   Adenovirus NOT DETECTED NOT DETECTED Final   Coronavirus 229E NOT DETECTED NOT DETECTED Final   Coronavirus HKU1 NOT DETECTED NOT DETECTED Final   Coronavirus NL63 NOT DETECTED NOT DETECTED Final   Coronavirus OC43 NOT DETECTED NOT DETECTED Final   Metapneumovirus NOT DETECTED NOT DETECTED Final   Rhinovirus / Enterovirus NOT DETECTED NOT DETECTED Final   Influenza A NOT DETECTED NOT DETECTED Final   Influenza B NOT DETECTED NOT DETECTED Final   Parainfluenza Virus 1 NOT DETECTED NOT DETECTED Final   Parainfluenza Virus 2 NOT  DETECTED NOT DETECTED Final   Parainfluenza Virus 3 NOT DETECTED NOT DETECTED Final   Parainfluenza Virus 4 NOT DETECTED NOT DETECTED Final   Respiratory Syncytial Virus NOT DETECTED NOT DETECTED Final   Bordetella pertussis NOT DETECTED NOT DETECTED Final   Chlamydophila pneumoniae NOT DETECTED NOT DETECTED Final   Mycoplasma pneumoniae NOT DETECTED NOT DETECTED Final    Comment: Performed at [facility], [address], [place], [place] [address]       Radiology Studies: Dg Chest Port 1 View  Result Date: 12/19/2017 CLINICAL DATA:  Exacerbation of COPD.  Shortness of breath. EXAM: PORTABLE CHEST 1 VIEW COMPARISON:  12/13/2017 FINDINGS: Previous median sternotomy and CABG. Basilar lung markings are worsened, particularly on the right, consistent with basilar pneumonia. Upper lungs remain clear. No visible effusions. No acute bone finding. Previous thyroid surgery. IMPRESSION: Worsened basilar lung markings figure limb on the right most consistent with basilar pneumonia. Electronically Signed   By: Paulina Fusi M.D.   On: 12/19/2017 07:43      Scheduled Meds: . amiodarone  200 mg Oral Daily  . amLODipine  2.5 mg Oral Daily  . budesonide (PULMICORT)  nebulizer solution  0.25 mg Nebulization BID  . calcium-vitamin D  2 tablet Oral Q breakfast  . diclofenac sodium  2 g Topical TID AC & HS  . doxycycline  100 mg Oral Q12H  . ferrous gluconate  324 mg Oral BID WC  . ipratropium-albuterol  3 mL Nebulization TID  . isosorbide mononitrate  60 mg Oral Daily  . mouth rinse  15 mL Mouth Rinse BID  . [START ON 12/21/2017] methylPREDNISolone (SOLU-MEDROL) injection  60 mg Intravenous Q24H  . metoprolol tartrate  50 mg Oral BID  . pantoprazole  80 mg Oral Daily  . pregabalin  25 mg Oral QHS  . Rivaroxaban  15 mg Oral Q supper  . simvastatin  60 mg Oral q1800   Continuous Infusions:   LOS: 7 days    Time spent: 40 minutes   Noralee Stain, DO [facility] [email or web address] Password [facility] 12/20/2017, 2:43 PM

## 2017-12-21 LAB — BASIC METABOLIC PANEL
ANION GAP: 9 (ref 5–15)
BUN: 35 mg/dL — ABNORMAL HIGH (ref 6–20)
CALCIUM: 9.6 mg/dL (ref 8.9–10.3)
CO2: 35 mmol/L — ABNORMAL HIGH (ref 22–32)
Chloride: 99 mmol/L — ABNORMAL LOW (ref 101–111)
Creatinine, Ser: 1.01 mg/dL — ABNORMAL HIGH (ref 0.44–1.00)
GFR calc Af Amer: >60 mL/min (ref >60)
GFR, EST NON AFRICAN AMERICAN: 52 mL/min — AB (ref >60)
GLUCOSE: 118 mg/dL — AB (ref 65–99)
POTASSIUM: 4.8 mmol/L (ref 3.5–5.1)
Sodium: 143 mmol/L (ref 135–145)

## 2017-12-21 LAB — URINALYSIS, ROUTINE W REFLEX MICROSCOPIC
BILIRUBIN URINE: NEGATIVE
GLUCOSE, UA: NEGATIVE mg/dL
KETONES UR: NEGATIVE mg/dL
NITRITE: POSITIVE — AB
PH: 8 (ref 5.0–8.0)
PROTEIN: NEGATIVE mg/dL
Specific Gravity, Urine: 1.016 (ref 1.005–1.030)

## 2017-12-21 LAB — CBC
HEMATOCRIT: 33.7 % — AB (ref 36.0–46.0)
Hemoglobin: 10 g/dL — ABNORMAL LOW (ref 12.0–15.0)
MCH: 27.5 pg (ref 26.0–34.0)
MCHC: 29.7 g/dL — ABNORMAL LOW (ref 30.0–36.0)
MCV: 92.6 fL (ref 78.0–100.0)
PLATELETS: 269 10*3/uL (ref 150–400)
RBC: 3.64 MIL/uL — ABNORMAL LOW (ref 3.87–5.11)
RDW: 13.2 % (ref 11.5–15.5)
WBC: 15.7 10*3/uL — AB (ref 4.0–10.5)

## 2017-12-21 MED ORDER — FUROSEMIDE 20 MG PO TABS
20.0000 mg | ORAL_TABLET | Freq: Every day | ORAL | Status: DC
  Administered 2017-12-22: 20 mg via ORAL
  Filled 2017-12-21: qty 1

## 2017-12-21 MED ORDER — IPRATROPIUM-ALBUTEROL 0.5-2.5 (3) MG/3ML IN SOLN
3.0000 mL | Freq: Two times a day (BID) | RESPIRATORY_TRACT | Status: DC
  Administered 2017-12-22 – 2017-12-26 (×8): 3 mL via RESPIRATORY_TRACT
  Filled 2017-12-21 (×9): qty 3

## 2017-12-21 MED ORDER — SODIUM CHLORIDE 0.9 % IV SOLN
1.0000 g | INTRAVENOUS | Status: AC
  Administered 2017-12-21 – 2017-12-25 (×5): 1 g via INTRAVENOUS
  Filled 2017-12-21 (×5): qty 10

## 2017-12-21 MED ORDER — PREDNISONE 20 MG PO TABS
20.0000 mg | ORAL_TABLET | Freq: Every day | ORAL | Status: AC
  Administered 2017-12-22 – 2017-12-24 (×3): 20 mg via ORAL
  Filled 2017-12-21 (×3): qty 1

## 2017-12-21 MED ORDER — FUROSEMIDE 10 MG/ML IJ SOLN
40.0000 mg | Freq: Once | INTRAMUSCULAR | Status: AC
  Administered 2017-12-21: 40 mg via INTRAVENOUS
  Filled 2017-12-21: qty 4

## 2017-12-21 NOTE — Progress Notes (Signed)
Patient Demographics:    [name], is a 77 y.o. female, DOB - November 07, 1940, WUJ:[id number]  Admit date - 12/13/2017   Admitting Physician Jonah Blue, MD  Outpatient Primary MD for the patient is Maurice Small, MD  LOS - 8   Chief Complaint  Patient presents with  . Chest Pain  . Shortness of Breath        Subjective:    [name] today has no fevers, no emesis,  No chest pain, dyspnea on exertion persist, had difficulty getting in and out of the bathroom even with the therapist and with a walker, urine with very strong smell, need to rule out UTI  Assessment  & Plan :    Principal Problem:   Acute on chronic respiratory failure (HCC) Active Problems:   Coronary atherosclerosis   COPD exacerbation (HCC)   Chronic diastolic heart failure (HCC)   HTN (hypertension)   Cardiomyopathy (HCC)   Atrial fibrillation (HCC)   Mitral valve stenosis   Metabolic alkalosis  Brief Narrative:  [name] is a 77 y.o.female with past medical hx of anxiety/depression, COPD on 2 L O2 via [place] at rest/3 L O2 with exertion at baseline, OSA, Afib, chronic diastolic CHF, HTN, MV stenosis, CAD S/P CABG, rheumatic AV disease, pulmonary HTN, hiatal hernia, and HLD who was admitted on 12/13/2017 with COPD/pneumonia, there is now suspicion of possible UTI.  Patient has significant weakness/debility   Plan:- 1)Acute on chronic hypoxic respiratory failure secondary to COPD exacerbation and right basilar pneumonia-dyspnea on exertion persist, requiring about 3 L of oxygen, no productive cough, no significant wheezing, stop IV Solu-Medrol, start p.o. Prednisone, respiratory viral panel was negative, currently on doxycycline, Rocephin added due to suspicion of UTI  2)AKI----acute kidney injury on CKD stage III -     creatinine on admission= 1.28  ,   baseline creatinine = 1.3   , creatinine is now= 1.0 (creatinine  peaked at 1.99 this admission ), acute kidney injury has now resolved  ,  Avoid nephrotoxic agents/dehydration/hypotension  3)HFpEF/Chronic diastolic heart failure-- ???  Slight slight volume overload , give Lasix 20 mg daily , monitor fluid input and output and daily weight closely  4)Paroxysmal atrial fibrillation- -CHADSVASc score is 5, clinically stable, continue Xarelto for anticoagulation, continue metoprolol 50 twice daily amiodarone 200 daily for rate/rhythm control  5)Essential hypertension- -stable, continue Norvasc 2.5 mg daily, metoprolol 50 mg twice daily  6)CAD status post CABG- -Stable.  No ACS type symptoms continue imdur 60 mg  daily, zocor  60 mg daily  7)Possible UTI--- IV Rocephin pending cultures, leukocytosis may be from steroids, no fevers  8) generalized weakness/debility--- PT eval appreciated, social work consult for possible placement to skilled nursing facility for rehab.  Prior to admission patient had a fall about 3 weeks ago, she is deconditioned and likely to fall again now, please note patient is on Xarelto which will make falls more concerning  9)Depression/anxiety- -stable, continue Klonopin 0.25 mg 3 times daily as needed anxiety, may use trazodone as needed sleep   DVT prophylaxis: Xarelto Code Status: Partial code Family Communication: d/w daughter Olegario Messier Disposition Plan:  awaiting SNF   Lab Results  Component Value Date   PLT 269 12/21/2017  Inpatient Medications  Scheduled Meds: . amiodarone  200 mg Oral Daily  . amLODipine  2.5 mg Oral Daily  . budesonide (PULMICORT) nebulizer solution  0.25 mg Nebulization BID  . calcium-vitamin D  2 tablet Oral Q breakfast  . diclofenac sodium  2 g Topical TID AC & HS  . doxycycline  100 mg Oral Q12H  . ferrous gluconate  324 mg Oral BID WC  . ipratropium-albuterol  3 mL Nebulization TID  . isosorbide mononitrate  60 mg Oral Daily  . mouth rinse  15 mL Mouth Rinse BID  . methylPREDNISolone  (SOLU-MEDROL) injection  60 mg Intravenous Q24H  . metoprolol tartrate  50 mg Oral BID  . pantoprazole  80 mg Oral Daily  . pregabalin  25 mg Oral QHS  . Rivaroxaban  15 mg Oral Q supper  . simvastatin  60 mg Oral q1800   Continuous Infusions: . cefTRIAXone (ROCEPHIN)  IV 1 g (12/21/17 1407)   PRN Meds:.acetaminophen, albuterol, benzonatate, bisacodyl, clonazePAM, loratadine, nitroGLYCERIN, ondansetron **OR** ondansetron (ZOFRAN) IV, traZODone    Anti-infectives (From admission, onward)   Start     Dose/Rate Route Frequency Ordered Stop   12/21/17 1330  cefTRIAXone (ROCEPHIN) 1 g in sodium chloride 0.9 % 100 mL IVPB     1 g 200 mL/hr over 30 Minutes Intravenous Every 24 hours 12/21/17 1317     12/19/17 2200  doxycycline (VIBRA-TABS) tablet 100 mg  Status:  Discontinued     100 mg Oral Every 12 hours 12/19/17 1621 12/19/17 1857   12/19/17 2200  doxycycline (VIBRA-TABS) tablet 100 mg     100 mg Oral Every 12 hours 12/19/17 1857          Objective:   Vitals:   12/20/17 2142 12/21/17 0324 12/21/17 0500 12/21/17 0815  BP:  127/89    Pulse:  (!) 52    Resp:  20    Temp:  97.6 F (36.4 C)    TempSrc:  Oral    SpO2: 97% 98%  100%  Weight:   92.4 kg (203 lb 11.2 oz)   Height:        Wt Readings from Last 3 Encounters:  12/21/17 92.4 kg (203 lb 11.2 oz)  11/29/17 87.5 kg (193 lb)  11/21/17 89.6 kg (197 lb 8 oz)     Intake/Output Summary (Last 24 hours) at 12/21/2017 1423 Last data filed at 12/21/2017 1256 Gross per 24 hour  Intake 720 ml  Output 800 ml  Net -80 ml     Physical Exam  Gen:- Awake Alert, in no acute distress at rest able to speak in sentences HEENT:- [place].AT, No sclera icterus Nose-  3 L/min Neck-Supple Neck,No JVD,.  Lungs-diminished in bases, no wheezing CV- S1, S2 normal, irregular Abd-  +ve B.Sounds, Abd Soft, No tenderness, increased truncal adiposity Extremity/Skin:-Trace to +1 edema bilaterally, good pulses Psych-affect is appropriate,  oriented x3 Neuro-no new focal deficits, no tremors   Data Review:   Micro Results Recent Results (from the past 240 hour(s))  MRSA PCR Screening     Status: None   Collection Time: 12/14/17  1:50 AM  Result Value Ref Range Status   MRSA by PCR NEGATIVE NEGATIVE Final    Comment:        The GeneXpert MRSA Assay (FDA approved for NASAL specimens only), is one component of a comprehensive MRSA colonization surveillance program. It is not intended to diagnose MRSA infection nor to guide or monitor treatment for MRSA infections. Performed  at [facility], [address] [place], [place] [address]   Respiratory Panel by PCR     Status: None   Collection Time: 12/14/17  9:00 PM  Result Value Ref Range Status   Adenovirus NOT DETECTED NOT DETECTED Final   Coronavirus 229E NOT DETECTED NOT DETECTED Final   Coronavirus HKU1 NOT DETECTED NOT DETECTED Final   Coronavirus NL63 NOT DETECTED NOT DETECTED Final   Coronavirus OC43 NOT DETECTED NOT DETECTED Final   Metapneumovirus NOT DETECTED NOT DETECTED Final   Rhinovirus / Enterovirus NOT DETECTED NOT DETECTED Final   Influenza A NOT DETECTED NOT DETECTED Final   Influenza B NOT DETECTED NOT DETECTED Final   Parainfluenza Virus 1 NOT DETECTED NOT DETECTED Final   Parainfluenza Virus 2 NOT DETECTED NOT DETECTED Final   Parainfluenza Virus 3 NOT DETECTED NOT DETECTED Final   Parainfluenza Virus 4 NOT DETECTED NOT DETECTED Final   Respiratory Syncytial Virus NOT DETECTED NOT DETECTED Final   Bordetella pertussis NOT DETECTED NOT DETECTED Final   Chlamydophila pneumoniae NOT DETECTED NOT DETECTED Final   Mycoplasma pneumoniae NOT DETECTED NOT DETECTED Final    Comment: Performed at [facility], [address] [place], [place] [address]    Radiology Reports Dg Chest 2 View  Result Date: 12/13/2017 CLINICAL DATA:  Shortness of breath.  Weakness. EXAM: CHEST - 2 VIEW COMPARISON:  Chest CT 11/01/2017.  Chest x-ray  10/19/2017. FINDINGS: AP and lateral views of the chest show cardiomegaly. Stable asymmetric elevation left hemidiaphragm. Mild left base collapse/consolidation is stable since prior studies and may reflect scarring. The visualized bony structures of the thorax are intact. Patient is status post median sternotomy. Telemetry leads overlie the chest. IMPRESSION: Stable exam without acute cardiopulmonary findings. Opacity at the left base is similar to prior studies and may reflect chronic atelectasis or scarring. Multiple tiny pulmonary nodules seen on previous chest CT not evident by x-ray today. Electronically Signed   By: Kennith Center M.D.   On: 12/13/2017 13:32   Dg Chest Port 1 View  Result Date: 12/19/2017 CLINICAL DATA:  Exacerbation of COPD.  Shortness of breath. EXAM: PORTABLE CHEST 1 VIEW COMPARISON:  12/13/2017 FINDINGS: Previous median sternotomy and CABG. Basilar lung markings are worsened, particularly on the right, consistent with basilar pneumonia. Upper lungs remain clear. No visible effusions. No acute bone finding. Previous thyroid surgery. IMPRESSION: Worsened basilar lung markings figure limb on the right most consistent with basilar pneumonia. Electronically Signed   By: Paulina Fusi M.D.   On: 12/19/2017 07:43     CBC Recent Labs  Lab 12/15/17 0702 12/16/17 0416 12/17/17 0353 12/18/17 0330 12/21/17 0405  WBC 16.6* 12.5* 12.1* 10.1 15.7*  HGB 10.0* 10.4* 10.3* 10.1* 10.0*  HCT 33.5* 34.9* 34.3* 33.5* 33.7*  PLT 235 243 252 229 269  MCV 93.1 93.1 92.0 92.3 92.6  MCH 27.8 27.7 27.6 27.8 27.5  MCHC 29.9* 29.8* 30.0 30.1 29.7*  RDW 13.2 13.0 13.1 13.0 13.2    Chemistries  Recent Labs  Lab 12/15/17 0702  12/16/17 0416 12/17/17 0353 12/18/17 0330 12/19/17 0723 12/21/17 0405  NA  --    < > 143 140 143 143 143  K  --    < > 3.6 3.8 3.3* 4.4 4.8  CL  --    < > 91* 88* 93* 96* 99*  CO2  --    < > 39* 41* 40* 39* 35*  GLUCOSE  --    < >  163* 149* 162* 133* 118*  BUN   --    < > 40* 51* 50* 46* 35*  CREATININE  --    < > 1.57* 1.99* 1.54* 1.16* 1.01*  CALCIUM  --    < > 9.6 9.4 9.0 9.2 9.6  MG 2.4  --  2.2 2.5* 2.2 2.6*  --   AST  --   --   --   --   --  24  --   ALT  --   --   --   --   --  31  --   ALKPHOS  --   --   --   --   --  70  --   BILITOT  --   --   --   --   --  0.7  --    < > = values in this interval not displayed.   ------------------------------------------------------------------------------------------------------------------ No results for input(s): CHOL, HDL, LDLCALC, TRIG, CHOLHDL, LDLDIRECT in the last 72 hours.  No results found for: HGBA1C ------------------------------------------------------------------------------------------------------------------ No results for input(s): TSH, T4TOTAL, T3FREE, THYROIDAB in the last 72 hours.  Invalid input(s): FREET3 ------------------------------------------------------------------------------------------------------------------ No results for input(s): VITAMINB12, FOLATE, FERRITIN, TIBC, IRON, RETICCTPCT in the last 72 hours.  Coagulation profile No results for input(s): INR, PROTIME in the last 168 hours.  No results for input(s): DDIMER in the last 72 hours.  Cardiac Enzymes Recent Labs  Lab 12/14/17 1852 12/15/17 0108 12/15/17 0702  TROPONINI 0.03* 0.03* <0.03   ------------------------------------------------------------------------------------------------------------------    Component Value Date/Time   BNP 145.7 (H) 12/13/2017 1153   BNP 285.9 (H) 01/27/2016 9528     Shon Hale M.D on 12/21/2017 at 2:23 PM  Between 7am to 7pm - Pager - [phone]  After 7pm go to [email or web address] - password [email or web address]  [facility] -  Office  [phone]   Voice Recognition Reubin Milan dictation system was used to create this note, attempts have been made to correct errors. Please contact the author with questions and/or clarifications.

## 2017-12-21 NOTE — Progress Notes (Signed)
Physical Therapy Treatment Patient Details Name: [name] MRN: [id number] DOB: 1940/09/20 Today's Date: 12/21/2017    History of Present Illness 77 y.o. WF PMHx Anxiety, depression, COPD on 2 L O2 via  at rest/3 L O2 with exertion,, OSA, A. fib on anticoagulation Chronic Diastolic CHF, HTN, MV stenosis, CAD, pulmonary HTN, S/P CABG rheumatic AV disease, Hiatal Hernia, HLD. Presents with worseing SOB, likely COPD exacerbation    PT Comments    Pt admitted with above diagnosis. Pt currently with functional limitations due to the deficits listed below (see PT Problem List). Pt was able to ambulate in room but fatigued to the point of needing mod assist and mod cues for safety to get back to bed.  DOE 4/4 as well as significant weakness with trunk flexed and LEs weak.  MD present and discussed SNF with pt in agreement.  Called daughter to update per pt request.   Pt will benefit from skilled PT to increase their independence and safety with mobility to allow discharge to the venue listed below.     Follow Up Recommendations  SNF;Supervision/Assistance - 24 hour     Equipment Recommendations  None recommended by PT    Recommendations for Other Services       Precautions / Restrictions Precautions Precautions: Fall Precaution Comments: watch O2 sats Restrictions Weight Bearing Restrictions: No    Mobility  Bed Mobility Overal bed mobility: Needs Assistance Bed Mobility: Sit to Supine       Sit to supine: Min guard   General bed mobility comments: guard assist to lie back down  Transfers Overall transfer level: Needs assistance Equipment used: Rolling walker (2 wheeled) Transfers: Sit to/from Stand Sit to Stand: Min guard         General transfer comment: Pt was able to stand with steadying assist only   Ambulation/Gait Ambulation/Gait assistance: Min assist;Mod assist Ambulation Distance (Feet): (15 feet x 2) Assistive device: Rolling walker (2 wheeled) Gait  Pattern/deviations: Step-through pattern;Trunk flexed;Decreased stride length;Drifts right/left Gait velocity: decreased Gait velocity interpretation: <1.31 ft/sec, indicative of household ambulator General Gait Details: Pt amb on 4L of O2 to keep sats 90% and at times still less than 88% unless pursed lip breathing. cues to stay close to RW. Pt initiallly walking with more steady gait to the bathroom to change depends and get cleaned up as she was very wet on arrival.  Cleaned pt and change dDepends with pt having to stand for a bit with ming uard assist.  Pt then ambulated to sink to wash dentures and brush teeth.  Bythe time pt finished this, she was so fatigued, she barely made it back to the bed with poor safety and needed mod assist for stability.     Stairs             Wheelchair Mobility    Modified Rankin (Stroke Patients Only)       Balance Overall balance assessment: Needs assistance Sitting-balance support: No upper extremity supported;Feet supported Sitting balance-Leahy Scale: Good     Standing balance support: During functional activity;Bilateral upper extremity supported Standing balance-Leahy Scale: Poor Standing balance comment: relies on UE support bilaterally to support herself.                             Cognition Arousal/Alertness: Awake/alert Behavior During Therapy: WFL for tasks assessed/performed Overall Cognitive Status: Within Functional Limits for tasks assessed  Exercises      General Comments General comments (skin integrity, edema, etc.): Called daughter Olegario Messier to inform her about recommendation of pt to go to SNF for therapy prior to d/c home.       Pertinent Vitals/Pain Pain Assessment: No/denies pain    Home Living                      Prior Function            PT Goals (current goals can now be found in the care plan section) Acute Rehab PT  Goals Patient Stated Goal: return home, breathe better Progress towards PT goals: Progressing toward goals    Frequency    Min 3X/week      PT Plan Discharge plan needs to be updated    Co-evaluation              AM-PAC PT "6 Clicks" Daily Activity  Outcome Measure  Difficulty turning over in bed (including adjusting bedclothes, sheets and blankets)?: None Difficulty moving from lying on back to sitting on the side of the bed? : A Little Difficulty sitting down on and standing up from a chair with arms (e.g., wheelchair, bedside commode, etc,.)?: A Little Help needed moving to and from a bed to chair (including a wheelchair)?: A Little Help needed walking in hospital room?: A Lot Help needed climbing 3-5 steps with a railing? : A Lot 6 Click Score: 17    End of Session Equipment Utilized During Treatment: Gait belt;Oxygen Activity Tolerance: Patient limited by fatigue Patient left: in bed;with call bell/phone within reach;with bed alarm set Nurse Communication: Mobility status PT Visit Diagnosis: Unsteadiness on feet (R26.81);Difficulty in walking, not elsewhere classified (R26.2);Muscle weakness (generalized) (M62.81)     Time: 1610-9604 PT Time Calculation (min) (ACUTE ONLY): 38 min  Charges:  $Gait Training: 23-37 mins $Therapeutic Activity: 8-22 mins                    G Codes:       Kaston Faughn,PT Acute Rehabilitation [phone] (pager)    Berline Lopes 12/21/2017, 3:47 PM

## 2017-12-21 NOTE — Progress Notes (Signed)
Occupational Therapy Treatment Patient Details Name: [name] MRN: [id number] DOB: 1940-08-20 Today's Date: 12/21/2017    History of present illness 77 y.o. WF PMHx Anxiety, depression, COPD on 2 L O2 via [place] at rest/3 L O2 with exertion,, OSA, A. fib on anticoagulation Chronic Diastolic CHF, HTN, MV stenosis, CAD, pulmonary HTN, S/P CABG rheumatic AV disease, Hiatal Hernia, HLD. Presents with worseing SOB, likely COPD exacerbation   OT comments  Pt performing grooming and toileting at a supervision level. Cues for pursed lip breathing. Continues to become dyspneic with minimal exertion. Eager to go home.  Follow Up Recommendations  No OT follow up;Supervision/Assistance - 24 hour    Equipment Recommendations  None recommended by OT    Recommendations for Other Services      Precautions / Restrictions Precautions Precautions: Fall Precaution Comments: watch O2 sats       Mobility Bed Mobility               General bed mobility comments: pt seated at EOB upon arrival  Transfers Overall transfer level: Needs assistance Equipment used: Rolling walker (2 wheeled) Transfers: Sit to/from Stand Sit to Stand: Supervision         General transfer comment: for safety and lines    Balance Overall balance assessment: Needs assistance   Sitting balance-Leahy Scale: Good       Standing balance-Leahy Scale: Fair Standing balance comment: statically                           ADL either performed or assessed with clinical judgement   ADL Overall ADL's : Needs assistance/impaired     Grooming: Wash/dry hands;Wash/dry face;Standing;Supervision/safety                   Toilet Transfer: Supervision/safety;Ambulation;RW;BSC(BSC over toilet)   Toileting- Clothing Manipulation and Hygiene: Supervision/safety;Sit to/from stand       Functional mobility during ADLs: Rolling walker;Supervision/safety General ADL Comments: cues to use pursed lip  breathing     Vision       Perception     Praxis      Cognition Arousal/Alertness: Awake/alert Behavior During Therapy: WFL for tasks assessed/performed Overall Cognitive Status: Within Functional Limits for tasks assessed                                          Exercises     Shoulder Instructions       General Comments      Pertinent Vitals/ Pain       Pain Assessment: No/denies pain  Home Living                                          Prior Functioning/Environment              Frequency  Min 2X/week        Progress Toward Goals  OT Goals(current goals can now be found in the care plan section)  Progress towards OT goals: Progressing toward goals  Acute Rehab OT Goals Patient Stated Goal: return home, breathe better OT Goal Formulation: With patient Time For Goal Achievement: 12/30/17 Potential to Achieve Goals: Good  Plan Discharge plan remains appropriate    Co-evaluation  AM-PAC PT "6 Clicks" Daily Activity     Outcome Measure   Help from another person eating meals?: None Help from another person taking care of personal grooming?: A Little Help from another person toileting, which includes using toliet, bedpan, or urinal?: A Little Help from another person bathing (including washing, rinsing, drying)?: A Little Help from another person to put on and taking off regular upper body clothing?: None Help from another person to put on and taking off regular lower body clothing?: A Little 6 Click Score: 20    End of Session Equipment Utilized During Treatment: Gait belt;Rolling walker;Oxygen  OT Visit Diagnosis: Unsteadiness on feet (R26.81);Other abnormalities of gait and mobility (R26.89);Muscle weakness (generalized) (M62.81)   Activity Tolerance Patient tolerated treatment well   Patient Left in chair;with call bell/phone within reach   Nurse Communication          Time:  9629-5284 OT Time Calculation (min): 23 min  Charges: OT General Charges $OT Visit: 1 Visit OT Treatments $Self Care/Home Management : 23-37 mins  12/21/2017 Martie Round, OTR/L Pager: [phone]   Iran Planas, [place] Bailiff 12/21/2017, 9:11 AM

## 2017-12-22 LAB — BASIC METABOLIC PANEL
ANION GAP: 4 — AB (ref 5–15)
BUN: 40 mg/dL — ABNORMAL HIGH (ref 6–20)
CALCIUM: 9 mg/dL (ref 8.9–10.3)
CO2: 36 mmol/L — ABNORMAL HIGH (ref 22–32)
Chloride: 101 mmol/L (ref 101–111)
Creatinine, Ser: 1.01 mg/dL — ABNORMAL HIGH (ref 0.44–1.00)
GFR, EST NON AFRICAN AMERICAN: 52 mL/min — AB (ref >60)
Glucose, Bld: 110 mg/dL — ABNORMAL HIGH (ref 65–99)
POTASSIUM: 4.1 mmol/L (ref 3.5–5.1)
Sodium: 141 mmol/L (ref 135–145)

## 2017-12-22 LAB — MAGNESIUM: MAGNESIUM: 2.4 mg/dL (ref 1.7–2.4)

## 2017-12-22 NOTE — Clinical Social Work Note (Signed)
Clinical Social Work Assessment  Patient Details  Name: [name] MRN: [id number] Date of Birth: 1940-08-13  Date of referral:  12/22/17               Reason for consult:  Facility Placement, Discharge Planning                Permission sought to share information with:  Family Supports Permission granted to share information::  Yes, Verbal Permission Granted  Name::     Kellianne Ek   Agency::     Relationship::  son  Contact Information:  [phone]  Housing/Transportation Living arrangements for the past 2 months:  [place] of Information:  Patient Patient Interpreter Needed:  None Criminal Activity/Legal Involvement Pertinent to Current Situation/Hospitalization:  No - Comment as needed Significant Relationships:  Adult Children Lives with:  Spouse, Adult Children Do you feel safe going back to the place where you live?  Yes Need for family participation in patient care:  Yes (Comment)  Care giving concerns: Patient from home with spouse. PT changed recommendation from home health to SNF.   Social Worker assessment / plan: CSW met with patient at bedside. Patient alert and oriented, sitting up in bedside chair. CSW introduced self and role and discussed disposition planning. CSW and patient talked about change in PT recommendations. Patient reported she was feeling more weak yesterday due to the UTI and was unable to walk as much as usual. Patient indicated a strong preference to be discharged home with home health therapy.   Patient lives with spouse, though spouse has had bilateral AKAs. Patient's son also lives with them but works during the day. Patient reported she has neighbors who also help out or who she can call in an emergency. Patient hopeful to regain strength with treatment for UTI and return home. Declining SNF at this time. Patient was active with [facility] prior to admission. CSW referred to [facility] for home health needs and will sign  off for now. Please re-consult if SNF is still needed as patient nears discharge.  Employment status:  Retired Research officer, political party) PT Recommendations:  [place], Home with [place] / Referral to community resources:     Patient/Family's Response to care: Patient appreciative of care.  Patient/Family's Understanding of and Emotional Response to Diagnosis, Current Treatment, and Prognosis: Patient with good understanding of her condition and hopeful for return home at discharge.  Emotional Assessment Appearance:  Appears stated age Attitude/Demeanor/Rapport:  Engaged Affect (typically observed):  Pleasant, Appropriate, Accepting, Happy Orientation:  Oriented to Self, Oriented to Place, Oriented to  Time, Oriented to Situation Alcohol / Substance use:  Not Applicable Psych involvement (Current and /or in the community):  No (Comment)  Discharge Needs  Concerns to be addressed:  Discharge Planning Concerns, Care Coordination Readmission within the last 30 days:  No Current discharge risk:  Physical Impairment Barriers to Discharge:  Continued Medical Work up   Estanislado Emms, LCSW 12/22/2017, 11:23 AM

## 2017-12-22 NOTE — Care Management (Signed)
1411 12-22-17 Pt was previously active with [facility]. PTA from home with support of husband that is wheelchair bound and son that works (home at night). CM was asked to speak with patient with MD in regards to Medicare Rights. Rights explained to patient and daughter Olegario Messier. Pt is declining SNF at this time and wants to return home. CM did state that Personal Care Services may be an option in regards to additional coverage for patient to make sure that she is safe when she transitions home. CM provided the patient with Medicare.G[employer] to call to explore the options. Family is aware that this is an out of pocket expense. No further needs from CM at this time. Gala Lewandowsky , RN BSN Case Manager [phone]

## 2017-12-22 NOTE — Progress Notes (Signed)
Patient Demographics:    [name], is a 77 y.o. female, DOB - 10/13/1940, WUJ:[id number]  Admit date - 12/13/2017   Admitting Physician Jonah Blue, MD  Outpatient Primary MD for the patient is Maurice Small, MD  LOS - 9   Chief Complaint  Patient presents with  . Chest Pain  . Shortness of Breath        Subjective:    [name] today has no fevers, no emesis,  No chest pain, dyspnea on exertion persist, ambulatory challenges and poor endurance persist, patient's daughter and RN as well as case Nurse, mental health at bedside, patient insisted she wants to go home with home health, she continues to decline skilled nursing facility  Assessment  & Plan :    Principal Problem:   Acute on chronic respiratory failure (HCC) Active Problems:   Coronary atherosclerosis   COPD exacerbation (HCC)   Chronic diastolic heart failure (HCC)   HTN (hypertension)   Cardiomyopathy (HCC)   Atrial fibrillation (HCC)   Mitral valve stenosis   Metabolic alkalosis  Brief Narrative:  [name] is a 77 y.o.female with past medical hx of anxiety/depression, COPD on 2 L O2 via [place] at rest/3 L O2 with exertion at baseline, OSA, Afib, chronic diastolic CHF, HTN, MV stenosis, CAD S/P CABG, rheumatic AV disease, pulmonary HTN, hiatal hernia, and HLD who was admitted on 12/13/2017 with COPD/pneumonia, found to have Proteus mirabilis UTI as well, currently on Rocephin.  Patient has significant weakness/debility, ambulatory challenges and poor endurance persist disposition discussed with patient, patient's daughter and RN as well as case Nurse, mental health at bedside with home health versus skilled nursing facility   Plan:- 1)Acute on chronic hypoxic respiratory failure secondary to COPD exacerbation and right basilar pneumonia-dyspnea on exertion persist, requiring about 3 L of oxygen, no productive cough, no significant  wheezing, stop IV Solu-Medrol, start p.o. Prednisone, respiratory viral panel was negative, currently on doxycycline, Rocephin added due to suspicion of UTI  2)AKI----acute kidney injury on CKD stage III -     creatinine on admission= 1.28  ,   baseline creatinine = 1.3   , creatinine is now= 1.0 (creatinine peaked at 1.99 this admission ), acute kidney injury has now resolved  ,  Avoid nephrotoxic agents/dehydration/hypotension  3)HFpEF/Chronic diastolic heart failure--  C/n Lasix 20 mg daily , monitor fluid input and output and daily weight closely  4)Paroxysmal atrial fibrillation- -CHADSVASc score is 5, clinically stable, continue Xarelto for anticoagulation, continue metoprolol 50 twice daily amiodarone 200 daily for rate/rhythm control  5)Essential hypertension- -stable, continue Norvasc 2.5 mg daily, metoprolol 50 mg twice daily  6)CAD status post CABG- -Stable.  No ACS type symptoms continue imdur 60 mg  daily, zocor  60 mg daily  7) proteus UTI--- IV Rocephin pending sensitivities cultures, , no fevers  8) generalized weakness/debility--- PT eval appreciated, social work consult for possible placement to skilled nursing facility for rehab.  Prior to admission patient had a fall about 3 weeks ago, she is deconditioned and likely to fall again now, please note patient is on Xarelto which will make falls more concerning. Patient has significant weakness/debility, ambulatory challenges and poor endurance persist disposition discussed with patient, patient's daughter and RN as well as  case manager Steward Drone at bedside with home health versus skilled nursing facility.  At this time patient is adamant that she wants to go home   9)Depression/anxiety- -stable, continue Klonopin 0.25 mg 3 times daily as needed anxiety, may use trazodone as needed sleep   DVT prophylaxis: Xarelto Code Status: Partial code Family Communication: d/w daughter Olegario Messier Disposition Plan:  awaiting SNF   Lab  Results  Component Value Date   PLT 269 12/21/2017    Inpatient Medications  Scheduled Meds: . amiodarone  200 mg Oral Daily  . amLODipine  2.5 mg Oral Daily  . budesonide (PULMICORT) nebulizer solution  0.25 mg Nebulization BID  . calcium-vitamin D  2 tablet Oral Q breakfast  . diclofenac sodium  2 g Topical TID AC & HS  . doxycycline  100 mg Oral Q12H  . ferrous gluconate  324 mg Oral BID WC  . furosemide  20 mg Oral Daily  . ipratropium-albuterol  3 mL Nebulization BID  . isosorbide mononitrate  60 mg Oral Daily  . mouth rinse  15 mL Mouth Rinse BID  . metoprolol tartrate  50 mg Oral BID  . pantoprazole  80 mg Oral Daily  . predniSONE  20 mg Oral Q breakfast  . pregabalin  25 mg Oral QHS  . Rivaroxaban  15 mg Oral Q supper  . simvastatin  60 mg Oral q1800   Continuous Infusions: . cefTRIAXone (ROCEPHIN)  IV Stopped (12/22/17 1256)   PRN Meds:.acetaminophen, albuterol, benzonatate, bisacodyl, clonazePAM, loratadine, nitroGLYCERIN, ondansetron **OR** ondansetron (ZOFRAN) IV, traZODone    Anti-infectives (From admission, onward)   Start     Dose/Rate Route Frequency Ordered Stop   12/21/17 1330  cefTRIAXone (ROCEPHIN) 1 g in sodium chloride 0.9 % 100 mL IVPB     1 g 200 mL/hr over 30 Minutes Intravenous Every 24 hours 12/21/17 1317     12/19/17 2200  doxycycline (VIBRA-TABS) tablet 100 mg  Status:  Discontinued     100 mg Oral Every 12 hours 12/19/17 1621 12/19/17 1857   12/19/17 2200  doxycycline (VIBRA-TABS) tablet 100 mg     100 mg Oral Every 12 hours 12/19/17 1857          Objective:   Vitals:   12/22/17 0813 12/22/17 0850 12/22/17 0855 12/22/17 1331  BP: (!) 130/47   (!) 104/44  Pulse: (!) 55   (!) 54  Resp:    18  Temp:    98 F (36.7 C)  TempSrc:    Oral  SpO2:  100% 99% 98%  Weight:      Height:        Wt Readings from Last 3 Encounters:  12/21/17 92.4 kg (203 lb 11.2 oz)  11/29/17 87.5 kg (193 lb)  11/21/17 89.6 kg (197 lb 8 oz)      Intake/Output Summary (Last 24 hours) at 12/22/2017 1929 Last data filed at 12/22/2017 1700 Gross per 24 hour  Intake 1060 ml  Output 1950 ml  Net -890 ml     Physical Exam  Gen:- Awake Alert, in no acute distress at rest able to speak in sentences HEENT:- [place].AT, No sclera icterus Nose- [place] 3 L/min Neck-Supple Neck,No JVD,.  Lungs-diminished in bases, no wheezing CV- S1, S2 normal, irregular Abd-  +ve B.Sounds, Abd Soft, No tenderness, increased truncal adiposity Extremity/Skin:- +1 edema bilaterally, good pulses Psych-affect is appropriate, oriented x3 Neuro-no new focal deficits, no tremors   Data Review:   Micro Results Recent Results (from the past  240 hour(s))  MRSA PCR Screening     Status: None   Collection Time: 12/14/17  1:50 AM  Result Value Ref Range Status   MRSA by PCR NEGATIVE NEGATIVE Final    Comment:        The GeneXpert MRSA Assay (FDA approved for NASAL specimens only), is one component of a comprehensive MRSA colonization surveillance program. It is not intended to diagnose MRSA infection nor to guide or monitor treatment for MRSA infections. Performed at [facility], [address], [place], [place] [address]   Respiratory Panel by PCR     Status: None   Collection Time: 12/14/17  9:00 PM  Result Value Ref Range Status   Adenovirus NOT DETECTED NOT DETECTED Final   Coronavirus 229E NOT DETECTED NOT DETECTED Final   Coronavirus HKU1 NOT DETECTED NOT DETECTED Final   Coronavirus NL63 NOT DETECTED NOT DETECTED Final   Coronavirus OC43 NOT DETECTED NOT DETECTED Final   Metapneumovirus NOT DETECTED NOT DETECTED Final   Rhinovirus / Enterovirus NOT DETECTED NOT DETECTED Final   Influenza A NOT DETECTED NOT DETECTED Final   Influenza B NOT DETECTED NOT DETECTED Final   Parainfluenza Virus 1 NOT DETECTED NOT DETECTED Final   Parainfluenza Virus 2 NOT DETECTED NOT DETECTED Final   Parainfluenza Virus 3 NOT DETECTED NOT DETECTED Final    Parainfluenza Virus 4 NOT DETECTED NOT DETECTED Final   Respiratory Syncytial Virus NOT DETECTED NOT DETECTED Final   Bordetella pertussis NOT DETECTED NOT DETECTED Final   Chlamydophila pneumoniae NOT DETECTED NOT DETECTED Final   Mycoplasma pneumoniae NOT DETECTED NOT DETECTED Final    Comment: Performed at [facility], [address], [place], [place] [address]  Culture, Urine     Status: Abnormal (Preliminary result)   Collection Time: 12/21/17  2:00 PM  Result Value Ref Range Status   Specimen Description URINE, RANDOM  Final   Special Requests NONE  Final   Culture (A)  Final    >=100,000 COLONIES/mL PROTEUS MIRABILIS SUSCEPTIBILITIES TO FOLLOW Performed at [facility], [address], [place], [place] [address]    Report Status PENDING  Incomplete    Radiology Reports Dg Chest 2 View  Result Date: 12/13/2017 CLINICAL DATA:  Shortness of breath.  Weakness. EXAM: CHEST - 2 VIEW COMPARISON:  Chest CT 11/01/2017.  Chest x-ray 10/19/2017. FINDINGS: AP and lateral views of the chest show cardiomegaly. Stable asymmetric elevation left hemidiaphragm. Mild left base collapse/consolidation is stable since prior studies and may reflect scarring. The visualized bony structures of the thorax are intact. Patient is status post median sternotomy. Telemetry leads overlie the chest. IMPRESSION: Stable exam without acute cardiopulmonary findings. Opacity at the left base is similar to prior studies and may reflect chronic atelectasis or scarring. Multiple tiny pulmonary nodules seen on previous chest CT not evident by x-ray today. Electronically Signed   By: Kennith Center M.D.   On: 12/13/2017 13:32   Dg Chest Port 1 View  Result Date: 12/19/2017 CLINICAL DATA:  Exacerbation of COPD.  Shortness of breath. EXAM: PORTABLE CHEST 1 VIEW COMPARISON:  12/13/2017 FINDINGS: Previous median sternotomy and CABG. Basilar lung markings are worsened, particularly on the right, consistent with basilar  pneumonia. Upper lungs remain clear. No visible effusions. No acute bone finding. Previous thyroid surgery. IMPRESSION: Worsened basilar lung markings figure limb on the right most consistent with basilar pneumonia. Electronically Signed   By: Paulina Fusi M.D.   On: 12/19/2017 07:43  CBC Recent Labs  Lab 12/16/17 0416 12/17/17 0353 12/18/17 0330 12/21/17 0405  WBC 12.5* 12.1* 10.1 15.7*  HGB 10.4* 10.3* 10.1* 10.0*  HCT 34.9* 34.3* 33.5* 33.7*  PLT 243 252 229 269  MCV 93.1 92.0 92.3 92.6  MCH 27.7 27.6 27.8 27.5  MCHC 29.8* 30.0 30.1 29.7*  RDW 13.0 13.1 13.0 13.2    Chemistries  Recent Labs  Lab 12/16/17 0416 12/17/17 0353 12/18/17 0330 12/19/17 0723 12/21/17 0405 12/22/17 0328  NA 143 140 143 143 143 141  K 3.6 3.8 3.3* 4.4 4.8 4.1  CL 91* 88* 93* 96* 99* 101  CO2 39* 41* 40* 39* 35* 36*  GLUCOSE 163* 149* 162* 133* 118* 110*  BUN 40* 51* 50* 46* 35* 40*  CREATININE 1.57* 1.99* 1.54* 1.16* 1.01* 1.01*  CALCIUM 9.6 9.4 9.0 9.2 9.6 9.0  MG 2.2 2.5* 2.2 2.6*  --  2.4  AST  --   --   --  24  --   --   ALT  --   --   --  31  --   --   ALKPHOS  --   --   --  70  --   --   BILITOT  --   --   --  0.7  --   --    ------------------------------------------------------------------------------------------------------------------ No results for input(s): CHOL, HDL, LDLCALC, TRIG, CHOLHDL, LDLDIRECT in the last 72 hours.  No results found for: HGBA1C ------------------------------------------------------------------------------------------------------------------ No results for input(s): TSH, T4TOTAL, T3FREE, THYROIDAB in the last 72 hours.  Invalid input(s): FREET3 ------------------------------------------------------------------------------------------------------------------ No results for input(s): VITAMINB12, FOLATE, FERRITIN, TIBC, IRON, RETICCTPCT in the last 72 hours.  Coagulation profile No results for input(s): INR, PROTIME in the last 168 hours.  No  results for input(s): DDIMER in the last 72 hours.  Cardiac Enzymes No results for input(s): CKMB, TROPONINI, MYOGLOBIN in the last 168 hours.  Invalid input(s): CK ------------------------------------------------------------------------------------------------------------------    Component Value Date/Time   BNP 145.7 (H) 12/13/2017 1153   BNP 285.9 (H) 01/27/2016 1610     Shon Hale M.D on 12/22/2017 at 7:29 PM  Between 7am to 7pm - Pager - [phone]  After 7pm go to [email or web address] - password [email or web address]  [facility] -  Office  [phone]   Voice Recognition Reubin Milan dictation system was used to create this note, attempts have been made to correct errors. Please contact the author with questions and/or clarifications.

## 2017-12-22 NOTE — Progress Notes (Signed)
CSW screened patient for PASRR for SNF. Patient discharging home with home health. If patient chooses to try to admit to SNF from home, home health social worker can assist with identifying facility. PASRR [id number]. CSW signing off.  Abigail Butts, LCSWA [phone]

## 2017-12-22 NOTE — Care Management Note (Signed)
Case Management Note  Patient Details  Name: [name] MRN: [id number] Date of Birth: [birth year]  Subjective/Objective:  Pt presented for Acute Respiratory Failure. Initiated on IV Lasix and now transitioned to po.                   Action/Plan: Family wants to take the patient home (please see previous note). CM did ask MD to write for additional orders RN, PT,OT SW, Aide to max out services. Pt is declining SNF at this time. Referral made to [facility] with [facility]. SOC to begin within 24-48 hours post transition home.   Expected Discharge Date:                  Expected Discharge Plan:  Home w Home Health Services  In-House Referral:  Clinical Social Work  Discharge planning Services  CM Consult  Post Acute Care Choice:  Durable Medical Equipment, Home Health, Resumption of Svcs/PTA Provider Choice offered to:  Patient, Adult Children  DME Arranged:  Nebulizer/meds DME Agency:  [facility]  HH Arranged:  RN, Nurse's Aide, Disease Management, PT, OT, Social Work, Advertising account executive HH Agency:  [facility]  Status of Service:  Completed, signed off  If discussed at [employer], dates discussed:    Additional Comments:  Gala Lewandowsky, RN 12/22/2017, 2:36 PM

## 2017-12-23 LAB — URINE CULTURE

## 2017-12-23 LAB — OCCULT BLOOD X 1 CARD TO LAB, STOOL: Fecal Occult Bld: POSITIVE — AB

## 2017-12-23 MED ORDER — FUROSEMIDE 20 MG PO TABS
20.0000 mg | ORAL_TABLET | Freq: Two times a day (BID) | ORAL | Status: DC
  Administered 2017-12-23 – 2017-12-24 (×2): 20 mg via ORAL
  Filled 2017-12-23 (×2): qty 1

## 2017-12-23 MED ORDER — FUROSEMIDE 10 MG/ML IJ SOLN
40.0000 mg | Freq: Once | INTRAMUSCULAR | Status: AC
  Administered 2017-12-23: 40 mg via INTRAVENOUS
  Filled 2017-12-23: qty 4

## 2017-12-23 NOTE — [place] FL2 (Signed)
[place] MEDICAID FL2 LEVEL OF CARE SCREENING TOOL     IDENTIFICATION  Patient Name: [name] Birthdate: 1941-05-23 Sex: female Admission Date (Current Location): 12/13/2017  [facility] and [place] Number:  Producer, television/film/video and Address:  [facility] [place], [address], [place], [place] [address]      Provider Number: [id number]  Attending Physician Name and Address:  Shon Hale, MD  Relative Name and Phone Number:       Current Level of Care: Hospital Recommended Level of Care: Skilled Nursing Facility Prior Approval Number:    Date Approved/Denied:   PASRR Number: [id number]  Discharge Plan: SNF    Current Diagnoses: Patient Active Problem List   Diagnosis Date Noted  . Respiratory distress 12/13/2017  . Metabolic alkalosis 12/13/2017  . Gait disturbance 11/21/2017  . Lung nodule 11/07/2016  . Mitral valve stenosis   . Rheumatic aortic valve disease   . Atrial fibrillation (HCC) 06/12/2016  . Absolute anemia   . Chronic respiratory failure with hypoxia (HCC) 04/20/2016  . Rheumatic mitral valve disease   . PFO (patent foramen ovale)   . Cardiomyopathy (HCC)   . Chronic atrial fibrillation (HCC)   . Alcoholic cardiomyopathy (HCC)   . Aortic valve vegetation 09/24/2015  . Lumbar spine scoliosis 04/18/2015  . Mitral stenosis 02/10/2015  . Chronic diastolic heart failure (HCC) 05/10/2013  . Atrial flutter (HCC) 05/10/2013  . Chronic anticoagulation 05/10/2013  . HTN (hypertension) 05/10/2013  . Dyslipidemia, goal LDL below 70 05/10/2013  . Seasonal allergic rhinitis 04/11/2012  . Acute on chronic respiratory failure (HCC) 11/02/2011  . DYSPNEA 03/14/2008  . Coronary atherosclerosis 02/27/2008  . COPD exacerbation (HCC) 02/27/2008  . Pulmonary hypertension (HCC) 02/21/2008  . DIASTOLIC DYSFUNCTION 02/21/2008    Orientation RESPIRATION BLADDER Height & Weight     Self, Time, Situation, Place  O2([place] 2L) Continent Weight:  202 lb 8 oz (91.9 kg) Height:  5\' 6"  (167.6 cm)  BEHAVIORAL SYMPTOMS/MOOD NEUROLOGICAL BOWEL NUTRITION STATUS      Continent Diet(heart healthy)  AMBULATORY STATUS COMMUNICATION OF NEEDS Skin   Limited Assist Verbally Normal                       Personal Care Assistance Level of Assistance  Bathing, Feeding, Dressing Bathing Assistance: Limited assistance Feeding assistance: Independent Dressing Assistance: Limited assistance     Functional Limitations Info  Sight, Hearing, Speech Sight Info: Adequate Hearing Info: Adequate Speech Info: Adequate    SPECIAL CARE FACTORS FREQUENCY  PT (By licensed PT), OT (By licensed OT)     PT Frequency: 5x/wk OT Frequency: 5x/wk            Contractures Contractures Info: Not present    Additional Factors Info  Code Status, Allergies, Psychotropic Code Status Info: Partial Allergies Info: Lisinopril, Tape, Xopenex Levalbuterol, Cefpodoxime, Codeine, Lipitor Atorvastatin, Other, Penicillins, Ventolin FAO:[id number], Anoro Ellipta Umeclidinium-vilanterol, Clindamycin, Hydrocodone-acetaminophen, Latex, Levalbuterol Hcl Psychotropic Info: Klonopin 0.25mg  3x/day PRN         Current Medications (12/23/2017):  This is the current hospital active medication list Current Facility-Administered Medications  Medication Dose Route Frequency Provider Last Rate Last Dose  . acetaminophen (TYLENOL) tablet 650 mg  650 mg Oral BID PRN Lonia Blood, MD   650 mg at 12/19/17 0038  . albuterol (PROVENTIL) (2.5 MG/3ML) 0.083% nebulizer solution 2.5 mg  2.5 mg Nebulization Q2H PRN Lonia Blood, MD      . amiodarone (PACERONE)  tablet 200 mg  200 mg Oral Daily Gwenyth Bender, NP   200 mg at 12/23/17 1134  . amLODipine (NORVASC) tablet 2.5 mg  2.5 mg Oral Daily Gwenyth Bender, NP   2.5 mg at 12/23/17 1134  . benzonatate (TESSALON) capsule 200 mg  200 mg Oral TID PRN Gwenyth Bender, NP      . bisacodyl (DULCOLAX) EC tablet 5 mg  5 mg Oral Daily  PRN Toya Smothers M, NP      . budesonide (PULMICORT) nebulizer solution 0.25 mg  0.25 mg Nebulization BID Drema Dallas, MD   0.25 mg at 12/23/17 0806  . calcium-vitamin D (OSCAL WITH D) 500-200 MG-UNIT per tablet 2 tablet  2 tablet Oral Q breakfast Gwenyth Bender, NP   2 tablet at 12/23/17 [phone]  . cefTRIAXone (ROCEPHIN) 1 g in sodium chloride 0.9 % 100 mL IVPB  1 g Intravenous Q24H Shon Hale, MD   Stopped at 12/22/17 1256  . clonazePAM (KLONOPIN) tablet 0.25 mg  0.25 mg Oral TID PRN Lonia Blood, MD   0.25 mg at 12/23/17 0739  . diclofenac sodium (VOLTAREN) 1 % transdermal gel 2 g  2 g Topical TID AC & HS Gwenyth Bender, NP   2 g at 12/21/17 1007  . doxycycline (VIBRA-TABS) tablet 100 mg  100 mg Oral Q12H Lonia Blood, MD   100 mg at 12/23/17 1134  . ferrous gluconate (FERGON) tablet 324 mg  324 mg Oral BID WC Lonia Blood, MD   324 mg at 12/22/17 1721  . furosemide (LASIX) tablet 20 mg  20 mg Oral BID Emokpae, Courage, MD      . ipratropium-albuterol (DUONEB) 0.5-2.5 (3) MG/3ML nebulizer solution 3 mL  3 mL Nebulization BID Mariea Clonts, Courage, MD   3 mL at 12/23/17 0806  . isosorbide mononitrate (IMDUR) 24 hr tablet 60 mg  60 mg Oral Daily Gwenyth Bender, NP   60 mg at 12/23/17 1134  . loratadine (CLARITIN) tablet 10 mg  10 mg Oral QHS PRN Gwenyth Bender, NP   10 mg at 12/18/17 0204  . MEDLINE mouth rinse  15 mL Mouth Rinse BID Jonah Blue, MD   15 mL at 12/22/17 2126  . metoprolol tartrate (LOPRESSOR) tablet 50 mg  50 mg Oral BID Gwenyth Bender, NP   50 mg at 12/23/17 1134  . nitroGLYCERIN (NITROSTAT) SL tablet 0.4 mg  0.4 mg Sublingual Q5 min PRN Black, Lesle Chris, NP      . ondansetron [facility]) tablet 4 mg  4 mg Oral Q6H PRN Gwenyth Bender, NP       Or  . ondansetron [facility]) injection 4 mg  4 mg Intravenous Q6H PRN Black, Karen M, NP      . pantoprazole (PROTONIX) EC tablet 80 mg  80 mg Oral Daily Gwenyth Bender, NP   80 mg at 12/23/17 1134  . predniSONE (DELTASONE)  tablet 20 mg  20 mg Oral Q breakfast Mariea Clonts, Courage, MD   20 mg at 12/23/17 0739  . pregabalin (LYRICA) capsule 25 mg  25 mg Oral QHS Drema Dallas, MD   25 mg at 12/22/17 2127  . Rivaroxaban (XARELTO) tablet 15 mg  15 mg Oral Q supper Gwenyth Bender, NP   15 mg at 12/22/17 1721  . simvastatin (ZOCOR) tablet 60 mg  60 mg Oral q1800 Gwenyth Bender, NP   60 mg at 12/22/17 1744  . traZODone (DESYREL)  tablet 25 mg  25 mg Oral QHS PRN Gwenyth Bender, NP   25 mg at 12/21/17 2144     Discharge Medications: Please see discharge summary for a list of discharge medications.  Relevant Imaging Results:  Relevant Lab Results:   Additional Information SS#: [id number]  Baldemar Lenis, LCSW

## 2017-12-23 NOTE — Progress Notes (Signed)
CSW alerted by RN that patient is now agreeable to SNF. CSW met with patient and discussed what has occurred between yesterday and today. Patient discussed how the doctor had confronted her on wanting to go home and when her family goes back to work on Monday, and if she falls and breaks something, then she's stuck living with that choice. Patient said it made her think and it would probably be best if she goes for SNF for a short while to get stronger first. Patient discussed wanting to go to either [facility] in [place] or somewhere close to [employer] where her daughters live.   CSW to fax out referral and follow up with facilities to determine bed offers. CSW to continue to follow.  [name], [place] Clinical Social Worker [phone]

## 2017-12-23 NOTE — Progress Notes (Signed)
Patient Demographics:    [name], is a 77 y.o. female, DOB - 03-Dec-1940, ZOX:[id number]  Admit date - 12/13/2017   Admitting Physician Jonah Blue, MD  Outpatient Primary MD for the patient is Maurice Small, MD  LOS - 10   Chief Complaint  Patient presents with  . Chest Pain  . Shortness of Breath        Subjective:    [name] today has no fevers, no emesis,  No chest pain, dyspnea on exertion persist, weight is up, lower extremity edema noted, patient is now requesting placement to skilled nursing facility because she is concerned about falling at home  Assessment  & Plan :    Principal Problem:   Acute on chronic respiratory failure [facility]) Active Problems:   Coronary atherosclerosis   COPD exacerbation (HCC)   Chronic diastolic heart failure (HCC)   HTN (hypertension)   Cardiomyopathy (HCC)   Atrial fibrillation (HCC)   Mitral valve stenosis   Metabolic alkalosis  Brief Narrative:  [name] is a 77 y.o.female with past medical hx of anxiety/depression, COPD on 2 L O2 via [place] at rest/3 L O2 with exertion at baseline, OSA, Afib, chronic diastolic CHF, HTN, MV stenosis, CAD S/P CABG, rheumatic AV disease, pulmonary HTN, hiatal hernia, and HLD who was admitted on 12/13/2017 with COPD/pneumonia, found to have Proteus mirabilis UTI as well, currently on Rocephin.  Patient has significant weakness/Debility, ambulatory challenges and poor endurance persist disposition discussed with patient, patient's daughter and RN as well as case Nurse, mental health at bedside with home health versus skilled nursing facility   Plan:- 1)Acute on chronic hypoxic respiratory failure secondary to COPD exacerbation and right basilar pneumonia- dyspnea on exertion persist, requiring about 2 to 3 L of oxygen, no productive cough, no significant wheezing, may discontinue p.o. Prednisone after 12/24/2016 the,  respiratory viral panel was negative, continue doxycycline (started on 12/19/2017 for COPD exacerbation), Rocephin added on 12/21/2017 for UTI  2)AKI----acute kidney injury on CKD stage III -     creatinine on admission= 1.28  ,   baseline creatinine = 1.3   , creatinine is now= 1.0 (creatinine peaked at 1.99 this admission ), acute kidney injury has now resolved  ,  Avoid nephrotoxic agents/dehydration/hypotension  3)HFpEF/Chronic diastolic heart failure--  weight gain, lower extremity edema, dyspnea on exertion noted, give IV Lasix 40 mg x 1, change p.o. Lasix to 20 mg twice daily, TED stockings,  monitor fluid input and output and daily weight closely  4)Paroxysmal atrial fibrillation- -CHADSVASc score is 5, clinically stable, continue Xarelto for anticoagulation, continue metoprolol 50 twice daily amiodarone 200 daily for rate/rhythm control  5)Essential hypertension- -stable, continue Norvasc 2.5 mg daily, metoprolol 50 mg twice daily  6)CAD status post CABG- -Stable.  No ACS type symptoms,  continue imdur 60 mg  daily, zocor  60 mg daily, continue metoprolol 50 mg twice daily  7)Proteus Mirabilis UTI--- continue IV Rocephin started 12/21/2017 for 5 doses, Proteus is mostly pansensitive, , no fevers  8)Generalized weakness/Debility--- PT eval appreciated, social work consult for possible placement to skilled nursing facility for rehab.  Prior to admission patient had a fall about 3 weeks ago, she is deconditioned and likely to fall again now, please note  patient is on Xarelto which will make falls more concerning. Patient has significant weakness/debility, ambulatory challenges and poor endurance persist disposition discussed with patient, patient's daughter and RN as well as case Nurse, mental health at bedside with home health versus skilled nursing facility.  Patient is now agreeable to skilled nursing facility placement after talking to her family members.  Social work input  requested   9)Depression/anxiety- -stable, continue Klonopin 0.25 mg 3 times daily as needed anxiety, may use trazodone as needed sleep   DVT prophylaxis: Xarelto Code Status: Partial code Family Communication: d/w daughter Olegario Messier Disposition Plan:  awaiting SNF   Lab Results  Component Value Date   PLT 269 12/21/2017    Inpatient Medications  Scheduled Meds: . amiodarone  200 mg Oral Daily  . amLODipine  2.5 mg Oral Daily  . budesonide (PULMICORT) nebulizer solution  0.25 mg Nebulization BID  . calcium-vitamin D  2 tablet Oral Q breakfast  . diclofenac sodium  2 g Topical TID AC & HS  . doxycycline  100 mg Oral Q12H  . ferrous gluconate  324 mg Oral BID WC  . furosemide  20 mg Oral BID  . ipratropium-albuterol  3 mL Nebulization BID  . isosorbide mononitrate  60 mg Oral Daily  . mouth rinse  15 mL Mouth Rinse BID  . metoprolol tartrate  50 mg Oral BID  . pantoprazole  80 mg Oral Daily  . predniSONE  20 mg Oral Q breakfast  . pregabalin  25 mg Oral QHS  . Rivaroxaban  15 mg Oral Q supper  . simvastatin  60 mg Oral q1800   Continuous Infusions: . cefTRIAXone (ROCEPHIN)  IV Stopped (12/22/17 1256)   PRN Meds:.acetaminophen, albuterol, benzonatate, bisacodyl, clonazePAM, loratadine, nitroGLYCERIN, ondansetron **OR** ondansetron (ZOFRAN) IV, traZODone    Anti-infectives (From admission, onward)   Start     Dose/Rate Route Frequency Ordered Stop   12/21/17 1330  cefTRIAXone (ROCEPHIN) 1 g in sodium chloride 0.9 % 100 mL IVPB     1 g 200 mL/hr over 30 Minutes Intravenous Every 24 hours 12/21/17 1317     12/19/17 2200  doxycycline (VIBRA-TABS) tablet 100 mg  Status:  Discontinued     100 mg Oral Every 12 hours 12/19/17 1621 12/19/17 1857   12/19/17 2200  doxycycline (VIBRA-TABS) tablet 100 mg     100 mg Oral Every 12 hours 12/19/17 1857          Objective:   Vitals:   12/22/17 2126 12/23/17 0518 12/23/17 0806 12/23/17 1407  BP:  (!) 113/54  (!) 136/55  Pulse:  (!) 58 (!) 57  (!) 57  Resp:  16  (!) 21  Temp:  98.4 F (36.9 C)  97.9 F (36.6 C)  TempSrc:  Oral  Oral  SpO2:  100% 100% 94%  Weight:  91.9 kg (202 lb 8 oz)    Height:        Wt Readings from Last 3 Encounters:  12/23/17 91.9 kg (202 lb 8 oz)  11/29/17 87.5 kg (193 lb)  11/21/17 89.6 kg (197 lb 8 oz)     Intake/Output Summary (Last 24 hours) at 12/23/2017 1556 Last data filed at 12/23/2017 1300 Gross per 24 hour  Intake 840 ml  Output 2250 ml  Net -1410 ml     Physical Exam  Gen:- Awake Alert, in no acute distress at rest able to speak in sentences HEENT:- [place].AT, No sclera icterus Nose- [place] 3 L/min Neck-Supple Neck,No JVD,.  Lungs-diminished in  bases, few bibasilar rales CV- S1, S2 normal, irregular Abd-  +ve B.Sounds, Abd Soft, No tenderness, increased truncal adiposity Extremity/Skin:- +1 edema bilaterally, good pulses Psych-affect is appropriate, oriented x3 Neuro-no new focal deficits, no tremors   Data Review:   Micro Results Recent Results (from the past 240 hour(s))  MRSA PCR Screening     Status: None   Collection Time: 12/14/17  1:50 AM  Result Value Ref Range Status   MRSA by PCR NEGATIVE NEGATIVE Final    Comment:        The GeneXpert MRSA Assay (FDA approved for NASAL specimens only), is one component of a comprehensive MRSA colonization surveillance program. It is not intended to diagnose MRSA infection nor to guide or monitor treatment for MRSA infections. Performed at [facility], [address], [place], [place] [address]   Respiratory Panel by PCR     Status: None   Collection Time: 12/14/17  9:00 PM  Result Value Ref Range Status   Adenovirus NOT DETECTED NOT DETECTED Final   Coronavirus 229E NOT DETECTED NOT DETECTED Final   Coronavirus HKU1 NOT DETECTED NOT DETECTED Final   Coronavirus NL63 NOT DETECTED NOT DETECTED Final   Coronavirus OC43 NOT DETECTED NOT DETECTED Final   Metapneumovirus NOT DETECTED NOT DETECTED Final    Rhinovirus / Enterovirus NOT DETECTED NOT DETECTED Final   Influenza A NOT DETECTED NOT DETECTED Final   Influenza B NOT DETECTED NOT DETECTED Final   Parainfluenza Virus 1 NOT DETECTED NOT DETECTED Final   Parainfluenza Virus 2 NOT DETECTED NOT DETECTED Final   Parainfluenza Virus 3 NOT DETECTED NOT DETECTED Final   Parainfluenza Virus 4 NOT DETECTED NOT DETECTED Final   Respiratory Syncytial Virus NOT DETECTED NOT DETECTED Final   Bordetella pertussis NOT DETECTED NOT DETECTED Final   Chlamydophila pneumoniae NOT DETECTED NOT DETECTED Final   Mycoplasma pneumoniae NOT DETECTED NOT DETECTED Final    Comment: Performed at [facility], [address], [place], [place] [address]  Culture, Urine     Status: Abnormal   Collection Time: 12/21/17  2:00 PM  Result Value Ref Range Status   Specimen Description URINE, RANDOM  Final   Special Requests   Final    NONE Performed at [facility], [address] [place], [place] [address]    Culture >=100,000 COLONIES/mL PROTEUS MIRABILIS (A)  Final   Report Status 12/23/2017 FINAL  Final   Organism ID, Bacteria PROTEUS MIRABILIS (A)  Final      Susceptibility   Proteus mirabilis - MIC*    AMPICILLIN <=2 SENSITIVE Sensitive     CEFAZOLIN <=4 SENSITIVE Sensitive     CEFTRIAXONE <=1 SENSITIVE Sensitive     CIPROFLOXACIN <=0.25 SENSITIVE Sensitive     GENTAMICIN <=1 SENSITIVE Sensitive     IMIPENEM 4 SENSITIVE Sensitive     NITROFURANTOIN 128 RESISTANT Resistant     TRIMETH/SULFA <=20 SENSITIVE Sensitive     AMPICILLIN/SULBACTAM <=2 SENSITIVE Sensitive     PIP/TAZO <=4 SENSITIVE Sensitive     * >=100,000 COLONIES/mL PROTEUS MIRABILIS    Radiology Reports Dg Chest 2 View  Result Date: 12/13/2017 CLINICAL DATA:  Shortness of breath.  Weakness. EXAM: CHEST - 2 VIEW COMPARISON:  Chest CT 11/01/2017.  Chest x-ray 10/19/2017. FINDINGS: AP and lateral views of the chest show cardiomegaly. Stable asymmetric elevation left hemidiaphragm.  Mild left base collapse/consolidation is stable since prior studies and may reflect scarring. The visualized bony structures of the  thorax are intact. Patient is status post median sternotomy. Telemetry leads overlie the chest. IMPRESSION: Stable exam without acute cardiopulmonary findings. Opacity at the left base is similar to prior studies and may reflect chronic atelectasis or scarring. Multiple tiny pulmonary nodules seen on previous chest CT not evident by x-ray today. Electronically Signed   By: Kennith Center M.D.   On: 12/13/2017 13:32   Dg Chest Port 1 View  Result Date: 12/19/2017 CLINICAL DATA:  Exacerbation of COPD.  Shortness of breath. EXAM: PORTABLE CHEST 1 VIEW COMPARISON:  12/13/2017 FINDINGS: Previous median sternotomy and CABG. Basilar lung markings are worsened, particularly on the right, consistent with basilar pneumonia. Upper lungs remain clear. No visible effusions. No acute bone finding. Previous thyroid surgery. IMPRESSION: Worsened basilar lung markings figure limb on the right most consistent with basilar pneumonia. Electronically Signed   By: Paulina Fusi M.D.   On: 12/19/2017 07:43     CBC Recent Labs  Lab 12/17/17 0353 12/18/17 0330 12/21/17 0405  WBC 12.1* 10.1 15.7*  HGB 10.3* 10.1* 10.0*  HCT 34.3* 33.5* 33.7*  PLT 252 229 269  MCV 92.0 92.3 92.6  MCH 27.6 27.8 27.5  MCHC 30.0 30.1 29.7*  RDW 13.1 13.0 13.2    Chemistries  Recent Labs  Lab 12/17/17 0353 12/18/17 0330 12/19/17 0723 12/21/17 0405 12/22/17 0328  NA 140 143 143 143 141  K 3.8 3.3* 4.4 4.8 4.1  CL 88* 93* 96* 99* 101  CO2 41* 40* 39* 35* 36*  GLUCOSE 149* 162* 133* 118* 110*  BUN 51* 50* 46* 35* 40*  CREATININE 1.99* 1.54* 1.16* 1.01* 1.01*  CALCIUM 9.4 9.0 9.2 9.6 9.0  MG 2.5* 2.2 2.6*  --  2.4  AST  --   --  24  --   --   ALT  --   --  31  --   --   ALKPHOS  --   --  70  --   --   BILITOT  --   --  0.7  --   --     ------------------------------------------------------------------------------------------------------------------ No results for input(s): CHOL, HDL, LDLCALC, TRIG, CHOLHDL, LDLDIRECT in the last 72 hours.  No results found for: HGBA1C ------------------------------------------------------------------------------------------------------------------ No results for input(s): TSH, T4TOTAL, T3FREE, THYROIDAB in the last 72 hours.  Invalid input(s): FREET3 ------------------------------------------------------------------------------------------------------------------ No results for input(s): VITAMINB12, FOLATE, FERRITIN, TIBC, IRON, RETICCTPCT in the last 72 hours.  Coagulation profile No results for input(s): INR, PROTIME in the last 168 hours.  No results for input(s): DDIMER in the last 72 hours.  Cardiac Enzymes No results for input(s): CKMB, TROPONINI, MYOGLOBIN in the last 168 hours.  Invalid input(s): CK ------------------------------------------------------------------------------------------------------------------    Component Value Date/Time   BNP 145.7 (H) 12/13/2017 1153   BNP 285.9 (H) 01/27/2016 1610     Shon Hale M.D on 12/23/2017 at 3:56 PM  Between 7am to 7pm - Pager - [phone]  After 7pm go to [email or web address] - password [email or web address]  [facility] -  Office  [phone]   Voice Recognition Reubin [place] dictation system was used to create this note, attempts have been made to correct errors. Please contact the author with questions and/or clarifications.

## 2017-12-24 LAB — BASIC METABOLIC PANEL
ANION GAP: 8 (ref 5–15)
BUN: 31 mg/dL — AB (ref 6–20)
CO2: 37 mmol/L — ABNORMAL HIGH (ref 22–32)
Calcium: 8.7 mg/dL — ABNORMAL LOW (ref 8.9–10.3)
Chloride: 99 mmol/L — ABNORMAL LOW (ref 101–111)
Creatinine, Ser: 1.14 mg/dL — ABNORMAL HIGH (ref 0.44–1.00)
GFR calc Af Amer: 52 mL/min — ABNORMAL LOW (ref >60)
GFR, EST NON AFRICAN AMERICAN: 45 mL/min — AB (ref >60)
Glucose, Bld: 90 mg/dL (ref 65–99)
POTASSIUM: 3.6 mmol/L (ref 3.5–5.1)
SODIUM: 144 mmol/L (ref 135–145)

## 2017-12-24 LAB — CBC
HCT: 34.1 % — ABNORMAL LOW (ref 36.0–46.0)
HEMOGLOBIN: 9.9 g/dL — AB (ref 12.0–15.0)
MCH: 27.6 pg (ref 26.0–34.0)
MCHC: 29 g/dL — ABNORMAL LOW (ref 30.0–36.0)
MCV: 95 fL (ref 78.0–100.0)
PLATELETS: 249 10*3/uL (ref 150–400)
RBC: 3.59 MIL/uL — AB (ref 3.87–5.11)
RDW: 13.3 % (ref 11.5–15.5)
WBC: 14.6 10*3/uL — ABNORMAL HIGH (ref 4.0–10.5)

## 2017-12-24 MED ORDER — POTASSIUM CHLORIDE CRYS ER 20 MEQ PO TBCR
40.0000 meq | EXTENDED_RELEASE_TABLET | Freq: Once | ORAL | Status: AC
  Administered 2017-12-24: 40 meq via ORAL
  Filled 2017-12-24: qty 2

## 2017-12-24 MED ORDER — FUROSEMIDE 10 MG/ML IJ SOLN
60.0000 mg | Freq: Two times a day (BID) | INTRAMUSCULAR | Status: DC
  Administered 2017-12-24: 60 mg via INTRAVENOUS
  Filled 2017-12-24: qty 6

## 2017-12-24 MED ORDER — SPIRONOLACTONE 25 MG PO TABS
25.0000 mg | ORAL_TABLET | Freq: Every day | ORAL | Status: DC
  Administered 2017-12-24 – 2017-12-26 (×3): 25 mg via ORAL
  Filled 2017-12-24 (×3): qty 1

## 2017-12-24 MED ORDER — FUROSEMIDE 10 MG/ML IJ SOLN
20.0000 mg | Freq: Once | INTRAMUSCULAR | Status: AC
  Administered 2017-12-24: 20 mg via INTRAVENOUS
  Filled 2017-12-24: qty 2

## 2017-12-24 NOTE — Progress Notes (Signed)
Patient requesting that Dr. Shirlee Latch see her while she's in the hospital. Dr. Mariea Clonts paged and request made. Pt made aware.

## 2017-12-24 NOTE — Consult Note (Signed)
Advanced Heart Failure Team Consult Note   Primary Physician: Maurice Small, MD PCP-Cardiologist:  No primary care provider on file.  Reason for Consultation: CHF  HPI:    [name] is seen today for evaluation of CHF at the request of Dr. Mariea Clonts.   [name] is a 77 y.o. female with a history of chronic diastolic CHF, moderate pulmonary HTN, HTN, CAD s/p CABG, and severe COPD on home oxygen.    Admitted 3/2 - 09/30/15 with A/C CHF. TEE was done to assess mitral stenosis, appeared mild to moderate, rheumatic valve.  TEE also showed aortic valve vegetation and concern arose for endocarditis with recent "tooth infection" per pt.  ID saw. 1 of 2 admission cultures with Gram + cocci in clusters thought to be contaminant.  2 further sets of BCx negative. RHC showed elevated right and left heart filling pressures.  Aortic valve was not crossed for full mitral stenosis assessment due to aortic valve vegetation. Diuresed 8 lbs. Discharge weight 175 lbs.   Due to increased dyspnea and exertional chest pain, she had RHC/LHC in 7/17.  Bypass grafts were patent but there was 90% stenosis in the distal LCx that could account for angina.  It was a small caliber vessel and not amenable to PCI. Filling pressures were near-normal on RHC.   Admitted 11/18 through 06/18/16 with chest pain and increased dyspnea. Exacerbation was thought to be from A fib RVR. She underwent successful DC/CV on 06/12/16. Diuresed with IV lasix. Discharged on amiodarone 200 mg twice a day+ xarelto 15 mg daily. Discharge weight was 175 pounds.    Last echo in 2/19 was stable with EF 55-60% and mild to moderate mitral stenosis (mean gradient 6, MVA 1.33 cm^2), mildly dilated RV with IV septal flattening, moderate TR, mild AI.    Patient was admitted on 12/13/17 with increased dyspnea and was thought to have a COPD exacerbation and possible PNA.  She was found to have Proteus UTI.  Antibiotics and steroids began.  She  developed AKI with creatinine to 1.99 so torsemide was held. She is up 10 lbs since admission.  She has developed lower extremity edema and feels like she is full of fluid.  Dyspnea walking around room.  She has remained in NSR on amiodarone this admission.   Review of Systems: All systems reviewed and negative except as per HPI.  Home Medications Prior to Admission medications   Medication Sig Start Date End Date Taking? Authorizing Provider  acetaminophen (TYLENOL) 500 MG tablet Take 1,000 mg by mouth 2 (two) times daily as needed for headache.    Yes [provider]  amiodarone (PACERONE) 200 MG tablet Take 1 tablet (200 mg total) by mouth daily. 12/11/17  Yes Laurey Morale, MD  amLODipine (NORVASC) 2.5 MG tablet TAKE 1 TABLET BY MOUTH  DAILY 01/12/17  Yes Laurey Morale, MD  benzonatate (TESSALON) 200 MG capsule Take 1 capsule (200 mg total) by mouth 3 (three) times daily as needed for cough. 08/26/16  Yes Clegg, Amy D, NP  calcium-vitamin D (OSCAL WITH D) 500-200 MG-UNIT per tablet Take 2 tablets by mouth daily with breakfast.   Yes [provider]  diclofenac sodium (VOLTAREN) 1 % GEL Apply 2 g topically 4 (four) times daily. 10/20/17  Yes Melene Plan, DO  IRON PO Take 1 tablet by mouth daily as needed (supplement).    Yes [provider]  isosorbide mononitrate (IMDUR) 60 MG 24 hr tablet Take  1 tablet (60 mg total) daily by mouth. 05/30/17  Yes Laurey Morale, MD  loratadine (CLARITIN) 10 MG tablet Take 10 mg by mouth at bedtime as needed for allergies.    Yes [provider]  metoprolol tartrate (LOPRESSOR) 50 MG tablet Take 1 tablet (50 mg total) by mouth 2 (two) times daily. 09/01/17  Yes Laurey Morale, MD  NITROSTAT 0.4 MG SL tablet place 1 tablet under the tongue if needed every 5 minutes for chest pain for 3 doses IF NO RELIEF AFTER 3RD DOSE CALL PRESCRIBER OR 911. 01/21/16  Yes Turner, Cornelious Bryant, MD  omeprazole (PRILOSEC OTC) 20 MG tablet Take 40 mg by  mouth daily.    Yes [provider]  omeprazole (PRILOSEC) 40 MG capsule Take 40 mg by mouth daily. 11/22/17  Yes [provider]  OXYGEN Inhale 2-3 L into the lungs continuous.    Yes [provider]  potassium chloride SA (K-DUR,KLOR-CON) 20 MEQ tablet TAKE 2 TABLETS BY MOUTH TWO TIMES DAILY 11/28/16  Yes Bensimhon, Bevelyn Buckles, MD  Rivaroxaban (XARELTO) 15 MG TABS tablet Take 1 tablet (15 mg total) by mouth daily with supper. 10/26/17  Yes Laurey Morale, MD  simvastatin (ZOCOR) 20 MG tablet TAKE 3 TABLETS BY MOUTH  DAILY AT 6 PM 05/30/17  Yes Laurey Morale, MD  spironolactone (ALDACTONE) 25 MG tablet TAKE 1 TABLET BY MOUTH  DAILY 05/30/17  Yes Laurey Morale, MD  torsemide (DEMADEX) 20 MG tablet Take 3 tablets (60 mg total) by mouth 2 (two) times daily. 09/26/17  Yes Graciella Freer, PA-C  albuterol (PROVENTIL) (2.5 MG/3ML) 0.083% nebulizer solution Take 3 mLs (2.5 mg total) by nebulization every 6 (six) hours as needed for wheezing or shortness of breath. Patient not taking: Reported on 11/29/2017 10/16/17   Waymon Budge, MD  budesonide-formoterol [facility]) 80-4.5 MCG/ACT inhaler Inhale 2 puffs into the lungs 2 (two) times daily. Patient not taking: Reported on 12/13/2017 11/29/17   Parrett, Virgel Bouquet, NP  budesonide-formoterol (SYMBICORT) 80-4.5 MCG/ACT inhaler Inhale 2 puffs into the lungs 2 (two) times daily. Patient not taking: Reported on 12/13/2017 11/29/17   Parrett, Tammy S, NP  ipratropium (ATROVENT) 0.02 % nebulizer solution USE 1 VIAL VIA NEBULIZER  EVERY 6 HOURS AS NEEDED FOR WHEEZING OR SHORTNESS OF  BREATH Patient not taking: Reported on 11/29/2017 07/17/17   Parrett, Virgel Bouquet, NP    Past Medical History: Past Medical History:  Diagnosis Date  . Acute parotitis 01/05/2016  . Anxiety   . Bacteremia, escherichia coli   . Bursitis, shoulder    bilateral  . CHF (congestive heart failure) (HCC)    chronic diasotlic CHF  . Complication of anesthesia     "have trouble getting me awake sometimes; been ok latelhy" (09/24/2015)  . COPD (chronic obstructive pulmonary disease) (HCC)   . Coronary heart disease 2001   s/p CABG  . Depression   . DJD (degenerative joint disease)   . GERD (gastroesophageal reflux disease)   . H/O hiatal hernia   . History of rheumatic heart disease    X 3-LAST TIME WHEN PATIENT WAS 77 YRS OLD  . Hyperlipidemia   . Hypertension   . OA (osteoarthritis)   . On home oxygen therapy    "2L; 24/7" (09/24/2015)  . PUD (peptic ulcer disease)   . Pulmonary HTN (HCC)    PASP 50-3mmHg by [id number]  . Sleep apnea    intolerant to CPAP  . Thyroid nodule   .  Urinary incontinence   . Varicose veins     Past Surgical History: Past Surgical History:  Procedure Laterality Date  . BILATERAL SALPINGOOPHORECTOMY    . CARDIAC CATHETERIZATION  02/11/2008, 01/30/2004, 06/14/2000  . CARDIAC CATHETERIZATION N/A 09/24/2015   Procedure: Right Heart Cath;  Surgeon: Laurey Morale, MD;  Location: [facility] INVASIVE CV LAB;  Service: Cardiovascular;  Laterality: N/A;  . CARDIAC CATHETERIZATION N/A 02/08/2016   Procedure: Right/Left Heart Cath and Coronary/Graft Angiography;  Surgeon: Laurey Morale, MD;  Location: [facility] INVASIVE CV LAB;  Service: Cardiovascular;  Laterality: N/A;  . CARDIOVERSION N/A 06/12/2016   Procedure: CARDIOVERSION;  Surgeon: Duke Salvia, MD;  Location: [facility] OR;  Service: Cardiovascular;  Laterality: N/A;  . CORONARY ARTERY BYPASS GRAFT  06/21/2000   x4  . INCONTINENCE SURGERY     "tacked"  . TEE WITHOUT CARDIOVERSION N/A 09/24/2015   Procedure: TRANSESOPHAGEAL ECHOCARDIOGRAM (TEE);  Surgeon: Laurey Morale, MD;  Location: [facility] ENDOSCOPY;  Service: Cardiovascular;  Laterality: N/A;  . THYROIDECTOMY, PARTIAL  2010   Dr Michaell Cowing  . TOTAL ABDOMINAL HYSTERECTOMY  1990   1990  . TUBAL LIGATION    . VAGINAL DELIVERY  x4    Family History: Family History  Problem Relation Age of Onset  . Anemia Mother   . Emphysema Father     . Esophageal cancer Daughter     Social History: Social History   Socioeconomic History  . Marital status: Married    Spouse name: Not on file  . Number of children: Not on file  . Years of education: Not on file  . Highest education level: Not on file  Occupational History  . Not on file  Social Needs  . Financial resource strain: Not on file  . Food insecurity:    Worry: Not on file    Inability: Not on file  . Transportation needs:    Medical: Not on file    Non-medical: Not on file  Tobacco Use  . Smoking status: Former Smoker    Packs/day: 1.00    Years: 47.00    Pack years: 47.00    Types: Cigarettes    Last attempt to quit: 07/26/2007    Years since quitting: 10.4  . Smokeless tobacco: Never Used  Substance and Sexual Activity  . Alcohol use: No  . Drug use: No  . Sexual activity: Not Currently  Lifestyle  . Physical activity:    Days per week: Not on file    Minutes per session: Not on file  . Stress: Not on file  Relationships  . Social connections:    Talks on phone: Not on file    Gets together: Not on file    Attends religious service: Not on file    Active member of club or organization: Not on file    Attends meetings of clubs or organizations: Not on file    Relationship status: Not on file  Other Topics Concern  . Not on file  Social History Narrative   Husband bilateral left amputee for atheorsclerotic disease    Allergies:  Allergies  Allergen Reactions  . Lisinopril Cough    Developed persistent dry cough for a month.   . Tape Other (See Comments)    Bleeding  . Xopenex [Levalbuterol] Other (See Comments)    Made mouth and throat sore  . Cefpodoxime Other (See Comments)    Yeast infections   . Codeine Other (See Comments)    Anxious/ jittery  .  Lipitor [Atorvastatin] Other (See Comments)    Made joint start hurting/ Muscle aches  . Other Swelling    Venholin HFA  . Penicillins Other (See Comments)    Has patient had a PCN  reaction causing immediate rash, facial/tongue/throat swelling, SOB or lightheadedness with hypotension: unknown, childhood reaction Has patient had a PCN reaction causing severe rash involving mucus membranes or skin necrosis: No Has patient had a PCN reaction that required hospitalization No Has patient had a PCN reaction occurring within the last 10 years: No If all of the above answers are "NO", then may proceed with Cephalosporin use.   Terald Sleeper [Kdc:Albuterol] Other (See Comments)    "jittery"  . Anoro Ellipta [Umeclidinium-Vilanterol] Itching  . Clindamycin Other (See Comments)    Unknown   . Hydrocodone-Acetaminophen Anxiety  . Latex Itching and Rash  . Levalbuterol Hcl Other (See Comments)    Unknown     Objective:    Vital Signs:   Temp:  [97.5 F (36.4 C)-98.1 F (36.7 C)] 98.1 F (36.7 C) (06/02 1304) Pulse Rate:  [57-73] 59 (06/02 1304) Resp:  [20] 20 (06/02 0559) BP: (107-118)/(48-73) 107/48 (06/02 1304) SpO2:  [77 %-100 %] 98 % (06/02 1304) Weight:  [202 lb 11.2 oz (91.9 kg)] 202 lb 11.2 oz (91.9 kg) (06/02 0559) Last BM Date: 12/23/17  Weight change: Filed Weights   12/21/17 0500 12/23/17 0518 12/24/17 0559  Weight: 203 lb 11.2 oz (92.4 kg) 202 lb 8 oz (91.9 kg) 202 lb 11.2 oz (91.9 kg)    Intake/Output:   Intake/Output Summary (Last 24 hours) at 12/24/2017 1420 Last data filed at 12/24/2017 1400 Gross per 24 hour  Intake 1920 ml  Output 1750 ml  Net 170 ml      Physical Exam    General:  NAD. No resp difficulty HEENT: normal Neck: supple. JVP 8-9 cm. Carotids 2+ bilat; no bruits. No lymphadenopathy or thyromegaly appreciated. Cor: PMI nondisplaced. Regular rate & rhythm. 2/6 SEM RUSB with clear S2. Lungs: Crackles at bases bilaterally Abdomen: soft, nontender, nondistended. No hepatosplenomegaly. No bruits or masses. Good bowel sounds. Extremities: no cyanosis, clubbing, rash. 1+ edema to knees bilaterally.  Neuro: alert & orientedx3, cranial  nerves grossly intact. moves all 4 extremities w/o difficulty. Affect pleasant   Telemetry   NSR 70s (personally reviewed)  EKG    NSR with RBBB (personally reviewed)  Labs   Basic Metabolic Panel: Recent Labs  Lab 12/18/17 0330 12/19/17 0723 12/21/17 0405 12/22/17 0328 12/24/17 0445  NA 143 143 143 141 144  K 3.3* 4.4 4.8 4.1 3.6  CL 93* 96* 99* 101 99*  CO2 40* 39* 35* 36* 37*  GLUCOSE 162* 133* 118* 110* 90  BUN 50* 46* 35* 40* 31*  CREATININE 1.54* 1.16* 1.01* 1.01* 1.14*  CALCIUM 9.0 9.2 9.6 9.0 8.7*  MG 2.2 2.6*  --  2.4  --     Liver Function Tests: Recent Labs  Lab 12/19/17 0723  AST 24  ALT 31  ALKPHOS 70  BILITOT 0.7  PROT 6.5  ALBUMIN 2.8*   No results for input(s): LIPASE, AMYLASE in the last 168 hours. No results for input(s): AMMONIA in the last 168 hours.  CBC: Recent Labs  Lab 12/18/17 0330 12/21/17 0405 12/24/17 0445  WBC 10.1 15.7* 14.6*  HGB 10.1* 10.0* 9.9*  HCT 33.5* 33.7* 34.1*  MCV 92.3 92.6 95.0  PLT 229 269 249    Cardiac Enzymes: No results for input(s): CKTOTAL, CKMB, CKMBINDEX, TROPONINI  in the last 168 hours.  BNP: BNP (last 3 results) Recent Labs    12/13/17 1153  BNP 145.7*    ProBNP (last 3 results) No results for input(s): PROBNP in the last 8760 hours.   CBG: No results for input(s): GLUCAP in the last 168 hours.  Coagulation Studies: No results for input(s): LABPROT, INR in the last 72 hours.   Imaging    No results found.   Medications:     Current Medications: . amiodarone  200 mg Oral Daily  . amLODipine  2.5 mg Oral Daily  . budesonide (PULMICORT) nebulizer solution  0.25 mg Nebulization BID  . calcium-vitamin D  2 tablet Oral Q breakfast  . diclofenac sodium  2 g Topical TID AC & HS  . doxycycline  100 mg Oral Q12H  . ferrous gluconate  324 mg Oral BID WC  . furosemide  60 mg Intravenous BID  . ipratropium-albuterol  3 mL Nebulization BID  . isosorbide mononitrate  60 mg Oral  Daily  . mouth rinse  15 mL Mouth Rinse BID  . metoprolol tartrate  50 mg Oral BID  . pantoprazole  80 mg Oral Daily  . pregabalin  25 mg Oral QHS  . Rivaroxaban  15 mg Oral Q supper  . simvastatin  60 mg Oral q1800     Infusions: . cefTRIAXone (ROCEPHIN)  IV Stopped (12/24/17 1425)      Assessment/Plan   1. Acute on chronic diastolic CHF/valvular heart disease with RV failure: Last echo in 2/19 showed normal EF, mild-moderate MS, mild AI, mildly dilated RV with flattened interventricular septum suggestive of RV pressure/volume overload.  Diuretics held during this admission with AKI.  Now she is volume overloaded on exam and weight is up 10 lbs from admission.   - Lasix 60 mg IV bid for now with strict I/Os and daily weights. - Place ted hose.  - Restart 25 mg spironolactone daily. - Fluid restrict, has been drinking a lot of fluid.  2. CAD S/P CABG:  She has potential source of angina from 90% stenosis in distal LCx.  The vessel is small at this point and not amenable to PCI.  No chest pain.   - Continue statin. - On Xarelto so no ASA. - Continue Imdur.  3. Atrial fibrillation: Paroxysmal. S/P DC-CV 06/12/16, seems to have remained in NSR since then.  NSR here in the hospital.  - Continue amio 200 mg once a day.  - Continue Xarelto/metoprolol.  4. Pulmonary UVO:ZDGU is primarily pulmonary venous hypertension based on prior RHC. Focus on fluid status.  5. COPD:  PFTs in 2/17 with moderate to severe obstruction.  On home oxygen 2 L by [place].  She was admitted with suspected COPD exacerbation, cannot rule out associated PNA.  She is on doxycycline and completed steroid course.  6.  OSA: Intolerant CPAP.  7.  Mitral stenosis: Rheumatic mitral stenosis.  Mild to moderate mitral stenosis by TEE in 3/17.  Unable to assess by cath in 3/17 because she had aortic valve vegetation and the valve was not crossed. Unable to assess by cath in 7/17 because she had transient complete heart block  when the LV was entered. Most recent echo in 2/19 also with mild to moderate mitral stenosis. Treating medically for now.  Would be high risk for surgery with severe COPD.  8. Aortic valve disease: Mild AI on echo in 2/19.  9. ID: Treated for UTI with ceftriaxone and COPD exacerbation with doxycycline.  Needs to work with PT, will need SNF stay.   Length of Stay: 64  Marca Ancona, MD  12/24/2017, 2:20 PM  Advanced Heart Failure Team Pager [phone] (M-F; 7a - 4p)  Please contact [facility] for night-coverage after hours (4p -7a ) and weekends on [email or web address]

## 2017-12-24 NOTE — Progress Notes (Addendum)
Patient Demographics:    [name], is a 77 y.o. female, DOB - 1941-04-05, WUJ:[id number]  Admit date - 12/13/2017   Admitting Physician Jonah Blue, MD  Outpatient Primary MD for the patient is Maurice Small, MD  LOS - 11   Chief Complaint  Patient presents with  . Chest Pain  . Shortness of Breath        Subjective:    [name] today has no fevers, no emesis,  No chest pain, dyspnea on exertion is slightly better after IV Lasix on 12/23/2017...   Patient has been drinking excessive amount of water/fluids/Tea because she states that she was trying to "flush out her kidneys" after being told she had UTI a few days ago and now she is volume overloaded persist, weight is up, lower extremity edema noted, patient is now requesting placement to skilled nursing facility because she is concerned about falling at home  Assessment  & Plan :    Principal Problem:   Acute on chronic respiratory failure (HCC) Active Problems:   Coronary atherosclerosis   COPD exacerbation (HCC)   Chronic diastolic heart failure (HCC)   HTN (hypertension)   Acute on chronic diastolic heart failure (HCC)   Cardiomyopathy (HCC)   Atrial fibrillation (HCC)   Mitral valve stenosis   Metabolic alkalosis  Brief Narrative:  [name] is a 77 y.o.female with past medical hx of anxiety/depression, COPD on 2 L O2 via [place] at rest/3 L O2 with exertion at baseline, OSA, Afib, chronic diastolic CHF, HTN, MV stenosis, CAD S/P CABG, rheumatic AV disease, pulmonary HTN, hiatal hernia, and HLD who was admitted on 12/13/2017 with COPD/pneumonia, found to have Proteus mirabilis UTI as well, currently on Rocephin.  Patient has significant weakness/Debility, ambulatory challenges and poor endurance persist disposition discussed with patient, patient's daughter and RN as well as case manager/SW, patient is now agreeable to go to skilled  nursing facility for rehab.  During this admission she developed AKI creatinine peaked at 1.99, diuretics were discontinued initially then restarted, however volume overload status noted with almost 10 pound weight gain, heart failure cardiology consult from Dr. Shirlee Latch appreciated, he increased diuretics on 12/24/2017   Plan:- 1)Acute on chronic hypoxic respiratory failure secondary to COPD exacerbation and right basilar pneumonia- dyspnea on exertion slightly better after additional dose of IV Lasix on 12/23/2017, requiring about 2 to 3 L of oxygen, no productive cough, no significant wheezing, may discontinue p.o. Prednisone after 12/24/2016 the, respiratory viral panel was negative, okay to stop doxycycline (started on 12/19/2017 for COPD exacerbation)  2)AKI----acute kidney injury on CKD stage III -     creatinine on admission= 1.28  ,   baseline creatinine = 1.3   , creatinine is now= 1.1 (creatinine peaked at 1.99 this admission ), acute kidney injury has now resolved  ,  Avoid nephrotoxic agents/dehydration/hypotension, watch renal function closely with increased diuretic dosage starting 12/24/2016  3)HFpEF/acute on chronic chronic Diastolic Heart Failure/pulmonary hypertension/valvular heart disease with RV failure--dyspnea on exertion slightly better after IV Lasix on 12/23/2017, please note that during this admission she developed AKI creatinine peaked at 1.99, diuretics were discontinued initially then restarted, however volume overload status noted with almost 10 pound weight gain, heart failure cardiology consult  from Dr. Shirlee Latch appreciated, he increased diuretics on 12/24/2017, Lasix 60 IV twice daily ordered by heart failure team, Aldactone 25 mg daily,  4)Paroxysmal atrial fibrillation- -CHADSVASc score is 5, clinically stable, continue Xarelto for anticoagulation, continue metoprolol 50 twice daily c/n amiodarone 200 daily for rate/rhythm control  5)Essential hypertension- -stable, continue Norvasc  2.5 mg daily, metoprolol 50 mg twice daily  6)CAD status post CABG- -Stable.  No ACS type symptoms,  continue imdur 60 mg  daily, zocor  60 mg daily, continue metoprolol 50 mg twice daily  7)Proteus Mirabilis UTI--- continue IV Rocephin started 12/21/2017 for 5 doses (last dose 12/25/17), Proteus is mostly pansensitive, , no fevers  8)Generalized weakness/Debility--- PT eval appreciated, social work consult for possible placement to skilled nursing facility for rehab.  Prior to admission patient had a fall about 3 weeks ago, she is deconditioned and likely to fall again now, please note patient is on Xarelto which will make falls more concerning. Patient has significant weakness/debility, ambulatory challenges and poor endurance persist disposition discussed with patient, patient's daughter and RN as well as case Nurse, mental health at bedside with home health versus skilled nursing facility.  Patient is now agreeable to skilled nursing facility placement after talking to her family members.  Social work input requested  9)Depression/anxiety- -stable, continue Klonopin 0.25 mg 3 times daily as needed anxiety, may use trazodone as needed sleep  10)Heme +ve Stool--- patient is on Xarelto, H&H is stable with hemoglobin around 10, no evidence of overt bleeding, continue Protonix 50 mg daily, avoid NSAIDs.  Consider GI consult if H&H drops or if overt bleeding  11) leukocytosis--multifactorial most likely due to combination of Proteus UTI, steroid therapy and respiratory infection   NB!!!  dyspnea on exertion is slightly better after IV Lasix on 12/23/2017...   Patient has been drinking excessive amount of water/fluids/Tea because she states that she was trying to "flush out her kidneys" after being told she had UTI a few days ago and now she is volume overloaded persist, weight is up, lower extremity edema noted,   DVT prophylaxis: Xarelto Code Status: Partial code Family Communication: d/w daughter  Olegario Messier Disposition Plan:  awaiting SNF  Consult- HF cardiology Dr Shirlee Latch  Lab Results  Component Value Date   PLT 249 12/24/2017    Inpatient Medications  Scheduled Meds: . amiodarone  200 mg Oral Daily  . amLODipine  2.5 mg Oral Daily  . budesonide (PULMICORT) nebulizer solution  0.25 mg Nebulization BID  . calcium-vitamin D  2 tablet Oral Q breakfast  . diclofenac sodium  2 g Topical TID AC & HS  . doxycycline  100 mg Oral Q12H  . ferrous gluconate  324 mg Oral BID WC  . furosemide  60 mg Intravenous BID  . ipratropium-albuterol  3 mL Nebulization BID  . isosorbide mononitrate  60 mg Oral Daily  . mouth rinse  15 mL Mouth Rinse BID  . metoprolol tartrate  50 mg Oral BID  . pantoprazole  80 mg Oral Daily  . pregabalin  25 mg Oral QHS  . Rivaroxaban  15 mg Oral Q supper  . simvastatin  60 mg Oral q1800  . spironolactone  25 mg Oral Daily   Continuous Infusions: . cefTRIAXone (ROCEPHIN)  IV Stopped (12/24/17 1425)   PRN Meds:.acetaminophen, albuterol, benzonatate, bisacodyl, clonazePAM, loratadine, nitroGLYCERIN, ondansetron **OR** ondansetron (ZOFRAN) IV, traZODone    Anti-infectives (From admission, onward)   Start     Dose/Rate Route Frequency Ordered Stop  12/21/17 1330  cefTRIAXone (ROCEPHIN) 1 g in sodium chloride 0.9 % 100 mL IVPB     1 g 200 mL/hr over 30 Minutes Intravenous Every 24 hours 12/21/17 1317     12/19/17 2200  doxycycline (VIBRA-TABS) tablet 100 mg  Status:  Discontinued     100 mg Oral Every 12 hours 12/19/17 1621 12/19/17 1857   12/19/17 2200  doxycycline (VIBRA-TABS) tablet 100 mg     100 mg Oral Every 12 hours 12/19/17 1857          Objective:   Vitals:   12/24/17 0736 12/24/17 0740 12/24/17 1028 12/24/17 1304  BP:   (!) 118/52 (!) 107/48  Pulse:   73 (!) 59  Resp:      Temp:    98.1 F (36.7 C)  TempSrc:    Oral  SpO2: 100% (!) 77%  98%  Weight:      Height:        Wt Readings from Last 3 Encounters:  12/24/17 91.9 kg (202 lb  11.2 oz)  11/29/17 87.5 kg (193 lb)  11/21/17 89.6 kg (197 lb 8 oz)     Intake/Output Summary (Last 24 hours) at 12/24/2017 1555 Last data filed at 12/24/2017 1432 Gross per 24 hour  Intake 1680 ml  Output 1050 ml  Net 630 ml     Physical Exam  Gen:- Awake Alert, in no acute distress at rest able to speak in sentences HEENT:- [place].AT, No sclera icterus Nose- [place] 3 L/min Neck-Supple Neck, +ve JVD,.  Lungs-diminished in bases, few bibasilar rales CV- S1, S2 normal, 2/6 sm Abd-  +ve B.Sounds, Abd Soft, No tenderness, increased truncal adiposity Extremity/Skin:- +1 edema bilaterally, good pulses Psych-affect is appropriate, oriented x3 Neuro-no new focal deficits, no tremors   Data Review:   Micro Results Recent Results (from the past 240 hour(s))  Respiratory Panel by PCR     Status: None   Collection Time: 12/14/17  9:00 PM  Result Value Ref Range Status   Adenovirus NOT DETECTED NOT DETECTED Final   Coronavirus 229E NOT DETECTED NOT DETECTED Final   Coronavirus HKU1 NOT DETECTED NOT DETECTED Final   Coronavirus NL63 NOT DETECTED NOT DETECTED Final   Coronavirus OC43 NOT DETECTED NOT DETECTED Final   Metapneumovirus NOT DETECTED NOT DETECTED Final   Rhinovirus / Enterovirus NOT DETECTED NOT DETECTED Final   Influenza A NOT DETECTED NOT DETECTED Final   Influenza B NOT DETECTED NOT DETECTED Final   Parainfluenza Virus 1 NOT DETECTED NOT DETECTED Final   Parainfluenza Virus 2 NOT DETECTED NOT DETECTED Final   Parainfluenza Virus 3 NOT DETECTED NOT DETECTED Final   Parainfluenza Virus 4 NOT DETECTED NOT DETECTED Final   Respiratory Syncytial Virus NOT DETECTED NOT DETECTED Final   Bordetella pertussis NOT DETECTED NOT DETECTED Final   Chlamydophila pneumoniae NOT DETECTED NOT DETECTED Final   Mycoplasma pneumoniae NOT DETECTED NOT DETECTED Final    Comment: Performed at [facility], [address] [place], [place] [address]  Culture, Urine     Status: Abnormal    Collection Time: 12/21/17  2:00 PM  Result Value Ref Range Status   Specimen Description URINE, RANDOM  Final   Special Requests   Final    NONE Performed at [facility], [address], [place], [place] [address]    Culture >=100,000 COLONIES/mL PROTEUS MIRABILIS (A)  Final   Report Status 12/23/2017 FINAL  Final   Organism ID, Bacteria PROTEUS MIRABILIS (A)  Final      Susceptibility   Proteus mirabilis - MIC*    AMPICILLIN <=2 SENSITIVE Sensitive     CEFAZOLIN <=4 SENSITIVE Sensitive     CEFTRIAXONE <=1 SENSITIVE Sensitive     CIPROFLOXACIN <=0.25 SENSITIVE Sensitive     GENTAMICIN <=1 SENSITIVE Sensitive     IMIPENEM 4 SENSITIVE Sensitive     NITROFURANTOIN 128 RESISTANT Resistant     TRIMETH/SULFA <=20 SENSITIVE Sensitive     AMPICILLIN/SULBACTAM <=2 SENSITIVE Sensitive     PIP/TAZO <=4 SENSITIVE Sensitive     * >=100,000 COLONIES/mL PROTEUS MIRABILIS    Radiology Reports Dg Chest 2 View  Result Date: 12/13/2017 CLINICAL DATA:  Shortness of breath.  Weakness. EXAM: CHEST - 2 VIEW COMPARISON:  Chest CT 11/01/2017.  Chest x-ray 10/19/2017. FINDINGS: AP and lateral views of the chest show cardiomegaly. Stable asymmetric elevation left hemidiaphragm. Mild left base collapse/consolidation is stable since prior studies and may reflect scarring. The visualized bony structures of the thorax are intact. Patient is status post median sternotomy. Telemetry leads overlie the chest. IMPRESSION: Stable exam without acute cardiopulmonary findings. Opacity at the left base is similar to prior studies and may reflect chronic atelectasis or scarring. Multiple tiny pulmonary nodules seen on previous chest CT not evident by x-ray today. Electronically Signed   By: Kennith Center M.D.   On: 12/13/2017 13:32   Dg Chest Port 1 View  Result Date: 12/19/2017 CLINICAL DATA:  Exacerbation of COPD.  Shortness of breath. EXAM: PORTABLE CHEST 1 VIEW COMPARISON:  12/13/2017 FINDINGS: Previous median  sternotomy and CABG. Basilar lung markings are worsened, particularly on the right, consistent with basilar pneumonia. Upper lungs remain clear. No visible effusions. No acute bone finding. Previous thyroid surgery. IMPRESSION: Worsened basilar lung markings figure limb on the right most consistent with basilar pneumonia. Electronically Signed   By: Paulina Fusi M.D.   On: 12/19/2017 07:43     CBC Recent Labs  Lab 12/18/17 0330 12/21/17 0405 12/24/17 0445  WBC 10.1 15.7* 14.6*  HGB 10.1* 10.0* 9.9*  HCT 33.5* 33.7* 34.1*  PLT 229 269 249  MCV 92.3 92.6 95.0  MCH 27.8 27.5 27.6  MCHC 30.1 29.7* 29.0*  RDW 13.0 13.2 13.3    Chemistries  Recent Labs  Lab 12/18/17 0330 12/19/17 0723 12/21/17 0405 12/22/17 0328 12/24/17 0445  NA 143 143 143 141 144  K 3.3* 4.4 4.8 4.1 3.6  CL 93* 96* 99* 101 99*  CO2 40* 39* 35* 36* 37*  GLUCOSE 162* 133* 118* 110* 90  BUN 50* 46* 35* 40* 31*  CREATININE 1.54* 1.16* 1.01* 1.01* 1.14*  CALCIUM 9.0 9.2 9.6 9.0 8.7*  MG 2.2 2.6*  --  2.4  --   AST  --  24  --   --   --   ALT  --  31  --   --   --   ALKPHOS  --  70  --   --   --   BILITOT  --  0.7  --   --   --    ------------------------------------------------------------------------------------------------------------------ No results for input(s): CHOL, HDL, LDLCALC, TRIG, CHOLHDL, LDLDIRECT in the last 72 hours.  No results found for: HGBA1C ------------------------------------------------------------------------------------------------------------------ No results for input(s): TSH, T4TOTAL, T3FREE, THYROIDAB in the last 72 hours.  Invalid input(s): FREET3 ------------------------------------------------------------------------------------------------------------------ No results for input(s): VITAMINB12, FOLATE, FERRITIN, TIBC, IRON, RETICCTPCT in the last 72 hours.  Coagulation profile No results for input(s): INR, PROTIME in the last 168  hours.  No results for input(s): DDIMER  in the last 72 hours.  Cardiac Enzymes No results for input(s): CKMB, TROPONINI, MYOGLOBIN in the last 168 hours.  Invalid input(s): CK ------------------------------------------------------------------------------------------------------------------    Component Value Date/Time   BNP 145.7 (H) 12/13/2017 1153   BNP 285.9 (H) 01/27/2016 4098     Shon Hale M.D on 12/24/2017 at 3:55 PM  Between 7am to 7pm - Pager - [phone]  After 7pm go to [email or web address] - password [email or web address]  [facility] -  Office  [phone]   Voice Recognition Reubin Milan dictation system was used to create this note, attempts have been made to correct errors. Please contact the author with questions and/or clarifications.

## 2017-12-25 ENCOUNTER — Ambulatory Visit: Payer: Medicare Other | Admitting: Internal Medicine

## 2017-12-25 DIAGNOSIS — E8770 Fluid overload, unspecified: Secondary | ICD-10-CM

## 2017-12-25 DIAGNOSIS — I5033 Acute on chronic diastolic (congestive) heart failure: Secondary | ICD-10-CM

## 2017-12-25 DIAGNOSIS — R0602 Shortness of breath: Secondary | ICD-10-CM

## 2017-12-25 LAB — BASIC METABOLIC PANEL
ANION GAP: 3 — AB (ref 5–15)
BUN: 32 mg/dL — ABNORMAL HIGH (ref 6–20)
CHLORIDE: 101 mmol/L (ref 101–111)
CO2: 41 mmol/L — ABNORMAL HIGH (ref 22–32)
CREATININE: 1.24 mg/dL — AB (ref 0.44–1.00)
Calcium: 8.4 mg/dL — ABNORMAL LOW (ref 8.9–10.3)
GFR calc non Af Amer: 41 mL/min — ABNORMAL LOW (ref >60)
GFR, EST AFRICAN AMERICAN: 47 mL/min — AB (ref >60)
Glucose, Bld: 106 mg/dL — ABNORMAL HIGH (ref 65–99)
POTASSIUM: 3.7 mmol/L (ref 3.5–5.1)
Sodium: 145 mmol/L (ref 135–145)

## 2017-12-25 MED ORDER — FUROSEMIDE 10 MG/ML IJ SOLN
80.0000 mg | Freq: Two times a day (BID) | INTRAMUSCULAR | Status: DC
  Administered 2017-12-25 (×2): 80 mg via INTRAVENOUS
  Filled 2017-12-25 (×2): qty 8

## 2017-12-25 MED ORDER — ALBUTEROL SULFATE (2.5 MG/3ML) 0.083% IN NEBU
2.5000 mg | INHALATION_SOLUTION | RESPIRATORY_TRACT | Status: DC | PRN
  Filled 2017-12-25: qty 3

## 2017-12-25 MED ORDER — POTASSIUM CHLORIDE CRYS ER 20 MEQ PO TBCR
40.0000 meq | EXTENDED_RELEASE_TABLET | Freq: Every day | ORAL | Status: DC
  Administered 2017-12-25: 40 meq via ORAL
  Filled 2017-12-25: qty 2

## 2017-12-25 NOTE — Progress Notes (Signed)
Physical Therapy Treatment Patient Details Name: [name] MRN: [id number] DOB: Sep 10, 1940 Today's Date: 12/25/2017    History of Present Illness 77 y.o. WF PMHx Anxiety, depression, COPD on 2 L O2 via [place] at rest/3 L O2 with exertion,, OSA, A. fib on anticoagulation Chronic Diastolic CHF, HTN, MV stenosis, CAD, pulmonary HTN, S/P CABG rheumatic AV disease, Hiatal Hernia, HLD. Presents with worseing SOB, likely COPD exacerbation    PT Comments    Pt admitted with above diagnosis. Pt currently with functional limitations due to balance and endurance deficits. Pt was able to ambulate with RW with better stability than last treatment but still needs min guard assist due to pts fatigue.   Pt sats with activity to 87% on 2L.  Needed 4L with activity to keep sats >88%.  Still needs SNF. Pt will benefit from skilled PT to increase their independence and safety with mobility to allow discharge to the venue listed below.     Follow Up Recommendations  SNF;Supervision/Assistance - 24 hour     Equipment Recommendations  None recommended by PT    Recommendations for Other Services       Precautions / Restrictions Precautions Precautions: Fall Precaution Comments: watch O2 sats Restrictions Weight Bearing Restrictions: No    Mobility  Bed Mobility               General bed mobility comments: in chair on arrival  Transfers Overall transfer level: Needs assistance Equipment used: Rolling walker (2 wheeled) Transfers: Sit to/from Stand Sit to Stand: Min guard         General transfer comment: Pt was able to stand with steadying assist only   Ambulation/Gait Ambulation/Gait assistance: Min guard Ambulation Distance (Feet): 125 Feet Assistive device: Rolling walker (2 wheeled) Gait Pattern/deviations: Step-through pattern;Trunk flexed;Decreased stride length;Drifts right/left Gait velocity: decreased Gait velocity interpretation: <1.31 ft/sec, indicative of household  ambulator General Gait Details: Pt amb on 4L of O2 to keep sats 90%. Cues to stay close to RW especially as pt fatigues. Overall, more steady than last treatment but still with poor endurance and need for assist as she fatigues.    Stairs             Wheelchair Mobility    Modified Rankin (Stroke Patients Only)       Balance Overall balance assessment: Needs assistance Sitting-balance support: No upper extremity supported;Feet supported Sitting balance-Leahy Scale: Good     Standing balance support: During functional activity;Bilateral upper extremity supported Standing balance-Leahy Scale: Poor Standing balance comment: relies on UE support bilaterally to support herself.                             Cognition Arousal/Alertness: Awake/alert Behavior During Therapy: WFL for tasks assessed/performed Overall Cognitive Status: Within Functional Limits for tasks assessed                                        Exercises      General Comments        Pertinent Vitals/Pain Pain Assessment: No/denies pain    Home Living                      Prior Function            PT Goals (current goals can now be found in the care  plan section) Acute Rehab PT Goals Patient Stated Goal: return home, breathe better Progress towards PT goals: Progressing toward goals    Frequency    Min 3X/week      PT Plan Current plan remains appropriate    Co-evaluation              AM-PAC PT "6 Clicks" Daily Activity  Outcome Measure  Difficulty turning over in bed (including adjusting bedclothes, sheets and blankets)?: None Difficulty moving from lying on back to sitting on the side of the bed? : A Little Difficulty sitting down on and standing up from a chair with arms (e.g., wheelchair, bedside commode, etc,.)?: A Little Help needed moving to and from a bed to chair (including a wheelchair)?: A Little Help needed walking in hospital  room?: A Little Help needed climbing 3-5 steps with a railing? : A Lot 6 Click Score: 18    End of Session Equipment Utilized During Treatment: Gait belt;Oxygen Activity Tolerance: Patient limited by fatigue Patient left: with call bell/phone within reach;in chair;with chair alarm set Nurse Communication: Mobility status PT Visit Diagnosis: Unsteadiness on feet (R26.81);Difficulty in walking, not elsewhere classified (R26.2);Muscle weakness (generalized) (M62.81)     Time: 1610-9604 PT Time Calculation (min) (ACUTE ONLY): 32 min  Charges:  $Gait Training: 23-37 mins                    G Codes:       Husein Guedes,PT Acute Rehabilitation [phone] (pager)    Berline Lopes 12/25/2017, 1:40 PM

## 2017-12-25 NOTE — Progress Notes (Signed)
Patient Demographics:    [name], is a 77 y.o. female, DOB - 1940/10/26, PFX:[id number]  Admit date - 12/13/2017   Admitting Physician Jonah Blue, MD  Outpatient Primary MD for the patient is Maurice Small, MD  LOS - 12   Chief Complaint  Patient presents with  . Chest Pain  . Shortness of Breath        Subjective:    [name] today has no fevers , sob a little better still leg swelling, the absorption catheter was replaced as patient was voiding into diapers and we missed sig UOP yest probably,.    Assessment  & Plan :    Principal Problem:   Acute on chronic respiratory failure (HCC) Active Problems:   Coronary atherosclerosis   COPD exacerbation (HCC)   Chronic diastolic heart failure (HCC)   HTN (hypertension)   Acute on chronic diastolic heart failure (HCC)   Cardiomyopathy (HCC)   Atrial fibrillation (HCC)   Mitral valve stenosis   Metabolic alkalosis  Brief Narrative:  [name] is a 77 y.o.female with past medical hx of anxiety/depression, COPD on 2 L O2 via [place] at rest/3 L O2 with exertion at baseline, OSA, Afib, chronic diastolic CHF, HTN, MV stenosis, CAD S/P CABG, rheumatic AV disease, pulmonary HTN, hiatal hernia, and HLD who was admitted on 12/13/2017 with COPD exacerbation treated and resolved, then found to have Proteus mirabilis UTI as well, currently on Rocephin. Then developed CHF exacerbation currently being treated by CHF team.  Patient has significant weakness/Debility, ambulatory challenges and poor endurance persist disposition discussed with patient, patient's daughter and RN as well as case manager/SW, patient is now agreeable to go to skilled nursing facility for rehab.     Plan:- 1) Acute on chronic chronic Diastolic Heart Failure - pulmonary hypertension/valvular heart disease with RV failure heart failure cardiology consult from Dr. Shirlee Latch  appreciated, patient gained fluid volume while in hospital for COPD exacerbation, now is on IV lasix for last 2-3 days and diuresing well, still dyspneic and drops SpO2 while talking, appreciate CHF assistance - IV lasix ^'d from 60 > 80mg  bid today - yesterday 's UOP was very low but inaccurate due to the urinary cath being dc'd and pt was voiding into diapers all day   2)Acute on chronic hypoxic respiratory failure secondary to COPD exacerbation-  SOB on admission on 5/22 treated with IV steroids and nebs, no antibiotics, CXR was clear on admission. SOB improved. Pt on home O2 2L at baseline.   3) CKD III - baseline creat around 11.2  4)Paroxysmal atrial fibrillation- -CHADSVASc score is 5, clinically stable, continue Xarelto for anticoagulation, continue metoprolol 50 twice daily c/n amiodarone 200 daily for rate/rhythm control  5)Essential hypertension- -stable, continue Norvasc 2.5 mg daily, metoprolol 50 mg twice daily  6)CAD status post CABG- -Stable.  No ACS type symptoms,  continue imdur 60 mg  daily, zocor  60 mg daily, continue metoprolol 50 mg twice daily  7)Proteus Mirabilis UTI--- continue IV Rocephin started 12/21/2017 for 5 doses (last dose 12/25/17), Proteus is mostly pansensitive, no fevers associated  8)Generalized weakness/Debility--- PT eval appreciated, social work consult for possible placement to skilled nursing facility for rehab.  Prior to admission patient had a fall  about 3 weeks ago, she is deconditioned and likely to fall again now, please note patient is on Xarelto which will make falls more concerning. Patient has significant weakness/debility, ambulatory challenges and poor endurance persist disposition discussed with patient, patient's daughter and RN as well as case Nurse, mental health at bedside with home health versus skilled nursing facility.  Patient is now agreeable to skilled nursing facility placement after talking to her family members.  Social work input  requested  9)Depression/anxiety- -stable, continue Klonopin 0.25 mg 3 times daily as needed anxiety, may use trazodone as needed sleep  10)Heme +ve Stool--- patient is on Xarelto, H&H is stable with hemoglobin around 10, no evidence of overt bleeding, continue Protonix 50 mg daily, avoid NSAIDs.  Consider GI consult if H&H drops or if overt bleeding  11) leukocytosis-- prob due to IV steroid therapy, no fevers    Vinson Moselle MD [facility] pgr [phone] 12/17/2017, 9:25 AM    DVT prophylaxis: Xarelto Code Status: Partial code Family Communication: d/w daughter Olegario Messier Disposition Plan:  awaiting SNF  Consult- HF cardiology Dr Shirlee Latch  Lab Results  Component Value Date   PLT 249 12/24/2017    Inpatient Medications  Scheduled Meds: . amiodarone  200 mg Oral Daily  . amLODipine  2.5 mg Oral Daily  . budesonide (PULMICORT) nebulizer solution  0.25 mg Nebulization BID  . calcium-vitamin D  2 tablet Oral Q breakfast  . diclofenac sodium  2 g Topical TID AC & HS  . doxycycline  100 mg Oral Q12H  . ferrous gluconate  324 mg Oral BID WC  . furosemide  80 mg Intravenous BID  . ipratropium-albuterol  3 mL Nebulization BID  . isosorbide mononitrate  60 mg Oral Daily  . mouth rinse  15 mL Mouth Rinse BID  . metoprolol tartrate  50 mg Oral BID  . pantoprazole  80 mg Oral Daily  . potassium chloride  40 mEq Oral Daily  . pregabalin  25 mg Oral QHS  . Rivaroxaban  15 mg Oral Q supper  . simvastatin  60 mg Oral q1800  . spironolactone  25 mg Oral Daily   Continuous Infusions: . cefTRIAXone (ROCEPHIN)  IV Stopped (12/24/17 1425)   PRN Meds:.acetaminophen, albuterol, benzonatate, bisacodyl, clonazePAM, loratadine, nitroGLYCERIN, ondansetron **OR** ondansetron (ZOFRAN) IV, traZODone    Anti-infectives (From admission, onward)   Start     Dose/Rate Route Frequency Ordered Stop   12/21/17 1330  cefTRIAXone (ROCEPHIN) 1 g in sodium chloride 0.9 % 100 mL IVPB     1  g 200 mL/hr over 30 Minutes Intravenous Every 24 hours 12/21/17 1317 12/26/17 1329   12/19/17 2200  doxycycline (VIBRA-TABS) tablet 100 mg  Status:  Discontinued     100 mg Oral Every 12 hours 12/19/17 1621 12/19/17 1857   12/19/17 2200  doxycycline (VIBRA-TABS) tablet 100 mg     100 mg Oral Every 12 hours 12/19/17 1857 12/25/17 2159        Objective:   Vitals:   12/24/17 1304 12/24/17 1658 12/24/17 2058 12/25/17 0540  BP: (!) 107/48 (!) 143/50 115/61 (!) 100/39  Pulse: (!) 59 63 66 (!) 52  Resp:   20 20  Temp: 98.1 F (36.7 C) (!) 97.5 F (36.4 C) 98 F (36.7 C) (!) 97.5 F (36.4 C)  TempSrc: Oral Oral Oral Oral  SpO2: 98% 99% 93% 99%  Weight:    91.8 kg (202 lb 6.4 oz)  Height:  Wt Readings from Last 3 Encounters:  12/25/17 91.8 kg (202 lb 6.4 oz)  11/29/17 87.5 kg (193 lb)  11/21/17 89.6 kg (197 lb 8 oz)     Intake/Output Summary (Last 24 hours) at 12/25/2017 0931 Last data filed at 12/25/2017 0846 Gross per 24 hour  Intake 1200 ml  Output 700 ml  Net 500 ml     Physical Exam  Gen:- Awake Alert, in no acute distress at rest able to speak in sentences HEENT:- [place].AT, No sclera icterus Nose- [place] 3 L/min Neck-Supple Neck, +ve JVD,.  Lungs-diminished in bases, few bibasilar rales CV- S1, S2 normal, 2/6 sm Abd-  +ve B.Sounds, Abd Soft, No tenderness, increased truncal adiposity Extremity/Skin:- +1 edema bilaterally, good pulses Psych-affect is appropriate, oriented x3 Neuro-no new focal deficits, no tremors   Data Review:   Micro Results Recent Results (from the past 240 hour(s))  Culture, Urine     Status: Abnormal   Collection Time: 12/21/17  2:00 PM  Result Value Ref Range Status   Specimen Description URINE, RANDOM  Final   Special Requests   Final    NONE Performed at [facility], [address], [place], [place] [address]    Culture >=100,000 COLONIES/mL PROTEUS MIRABILIS (A)  Final   Report Status 12/23/2017 FINAL  Final   Organism ID,  Bacteria PROTEUS MIRABILIS (A)  Final      Susceptibility   Proteus mirabilis - MIC*    AMPICILLIN <=2 SENSITIVE Sensitive     CEFAZOLIN <=4 SENSITIVE Sensitive     CEFTRIAXONE <=1 SENSITIVE Sensitive     CIPROFLOXACIN <=0.25 SENSITIVE Sensitive     GENTAMICIN <=1 SENSITIVE Sensitive     IMIPENEM 4 SENSITIVE Sensitive     NITROFURANTOIN 128 RESISTANT Resistant     TRIMETH/SULFA <=20 SENSITIVE Sensitive     AMPICILLIN/SULBACTAM <=2 SENSITIVE Sensitive     PIP/TAZO <=4 SENSITIVE Sensitive     * >=100,000 COLONIES/mL PROTEUS MIRABILIS    Radiology Reports Dg Chest 2 View  Result Date: 12/13/2017 CLINICAL DATA:  Shortness of breath.  Weakness. EXAM: CHEST - 2 VIEW COMPARISON:  Chest CT 11/01/2017.  Chest x-ray 10/19/2017. FINDINGS: AP and lateral views of the chest show cardiomegaly. Stable asymmetric elevation left hemidiaphragm. Mild left base collapse/consolidation is stable since prior studies and may reflect scarring. The visualized bony structures of the thorax are intact. Patient is status post median sternotomy. Telemetry leads overlie the chest. IMPRESSION: Stable exam without acute cardiopulmonary findings. Opacity at the left base is similar to prior studies and may reflect chronic atelectasis or scarring. Multiple tiny pulmonary nodules seen on previous chest CT not evident by x-ray today. Electronically Signed   By: Kennith Center M.D.   On: 12/13/2017 13:32   Dg Chest Port 1 View  Result Date: 12/19/2017 CLINICAL DATA:  Exacerbation of COPD.  Shortness of breath. EXAM: PORTABLE CHEST 1 VIEW COMPARISON:  12/13/2017 FINDINGS: Previous median sternotomy and CABG. Basilar lung markings are worsened, particularly on the right, consistent with basilar pneumonia. Upper lungs remain clear. No visible effusions. No acute bone finding. Previous thyroid surgery. IMPRESSION: Worsened basilar lung markings figure limb on the right most consistent with basilar pneumonia. Electronically Signed   By:  Paulina Fusi M.D.   On: 12/19/2017 07:43     CBC Recent Labs  Lab 12/21/17 0405 12/24/17 0445  WBC 15.7* 14.6*  HGB 10.0* 9.9*  HCT 33.7* 34.1*  PLT 269 249  MCV 92.6 95.0  MCH 27.5  27.6  MCHC 29.7* 29.0*  RDW 13.2 13.3    Chemistries  Recent Labs  Lab 12/19/17 0723 12/21/17 0405 12/22/17 0328 12/24/17 0445 12/25/17 0357  NA 143 143 141 144 145  K 4.4 4.8 4.1 3.6 3.7  CL 96* 99* 101 99* 101  CO2 39* 35* 36* 37* 41*  GLUCOSE 133* 118* 110* 90 106*  BUN 46* 35* 40* 31* 32*  CREATININE 1.16* 1.01* 1.01* 1.14* 1.24*  CALCIUM 9.2 9.6 9.0 8.7* 8.4*  MG 2.6*  --  2.4  --   --   AST 24  --   --   --   --   ALT 31  --   --   --   --   ALKPHOS 70  --   --   --   --   BILITOT 0.7  --   --   --   --    ------------------------------------------------------------------------------------------------------------------ No results for input(s): CHOL, HDL, LDLCALC, TRIG, CHOLHDL, LDLDIRECT in the last 72 hours.  No results found for: HGBA1C ------------------------------------------------------------------------------------------------------------------ No results for input(s): TSH, T4TOTAL, T3FREE, THYROIDAB in the last 72 hours.  Invalid input(s): FREET3 ------------------------------------------------------------------------------------------------------------------ No results for input(s): VITAMINB12, FOLATE, FERRITIN, TIBC, IRON, RETICCTPCT in the last 72 hours.  Coagulation profile No results for input(s): INR, PROTIME in the last 168 hours.  No results for input(s): DDIMER in the last 72 hours.  Cardiac Enzymes No results for input(s): CKMB, TROPONINI, MYOGLOBIN in the last 168 hours.  Invalid input(s): CK ------------------------------------------------------------------------------------------------------------------    Component Value Date/Time   BNP 145.7 (H) 12/13/2017 1153   BNP 285.9 (H) 01/27/2016 0754     Maree Krabbe M.D on 12/25/2017 at 9:31  AM  Between 7am to 7pm - Pager - [phone]  After 7pm go to [email or web address] - password [email or web address]  [facility] -  Office  [phone]   Voice Recognition Reubin Milan dictation system was used to create this note, attempts have been made to correct errors. Please contact the author with questions and/or clarifications.

## 2017-12-25 NOTE — Progress Notes (Addendum)
Advanced Heart Failure Rounding Note  PCP-Cardiologist: No primary care provider on file.   Subjective:    Feeling slightly better this am, relieved to have HF team seeing her. Denies SOB. De-satting this am while on phone, but O2 rapidly normalizes on [place] via O2 with several breaths. Denies lightheadedness or dizziness.   I/O positive and weight unchanged. Only 200 cc of UOP recorded, so ? Accuracy.   Objective:    Weight Range: 202 lb 6.4 oz (91.8 kg) Body mass index is 32.67 kg/m.   Vital Signs:   Temp:  [97.5 F (36.4 C)-98.1 F (36.7 C)] 97.5 F (36.4 C) (06/03 0540) Pulse Rate:  [52-73] 52 (06/03 0540) Resp:  [20] 20 (06/03 0540) BP: (100-143)/(39-61) 100/39 (06/03 0540) SpO2:  [93 %-99 %] 99 % (06/03 0540) Weight:  [202 lb 6.4 oz (91.8 kg)] 202 lb 6.4 oz (91.8 kg) (06/03 0540) Last BM Date: 12/23/17  Weight change: Filed Weights   12/23/17 0518 12/24/17 0559 12/25/17 0540  Weight: 202 lb 8 oz (91.9 kg) 202 lb 11.2 oz (91.9 kg) 202 lb 6.4 oz (91.8 kg)   Intake/Output:   Intake/Output Summary (Last 24 hours) at 12/25/2017 0749 Last data filed at 12/24/2017 1749 Gross per 24 hour  Intake 1440 ml  Output 200 ml  Net 1240 ml    Physical Exam    General:  NAD. No resp difficulty HEENT: Normal Neck: Supple. JVP 7-8 cm. Carotids 2+ bilat; no bruits. No lymphadenopathy or thyromegaly appreciated. Cor: PMI nondisplaced. Regular rate & rhythm. 2/6 SEM RUSB with clear S2.  Lungs: Crackles at bases bilaterally.  Abdomen: Soft, nontender, nondistended. No hepatosplenomegaly. No bruits or masses. Good bowel sounds. Extremities: No cyanosis, clubbing, or rash. 1-2+ edema with ted hose in place.  Neuro: Alert & orientedx3, cranial nerves grossly intact. moves all 4 extremities w/o difficulty. Affect pleasant  Telemetry   NSR 50-60s, personally reviewed.   EKG    No new tracings.    Labs    CBC Recent Labs    12/24/17 0445  WBC 14.6*  HGB 9.9*  HCT 34.1*    MCV 95.0  PLT 249   Basic Metabolic Panel Recent Labs    01/74/94 0445 12/25/17 0357  NA 144 145  K 3.6 3.7  CL 99* 101  CO2 37* 41*  GLUCOSE 90 106*  BUN 31* 32*  CREATININE 1.14* 1.24*  CALCIUM 8.7* 8.4*   Liver Function Tests No results for input(s): AST, ALT, ALKPHOS, BILITOT, PROT, ALBUMIN in the last 72 hours. No results for input(s): LIPASE, AMYLASE in the last 72 hours. Cardiac Enzymes No results for input(s): CKTOTAL, CKMB, CKMBINDEX, TROPONINI in the last 72 hours.  BNP: BNP (last 3 results) Recent Labs    12/13/17 1153  BNP 145.7*    ProBNP (last 3 results) No results for input(s): PROBNP in the last 8760 hours.   D-Dimer No results for input(s): DDIMER in the last 72 hours. Hemoglobin A1C No results for input(s): HGBA1C in the last 72 hours. Fasting Lipid Panel No results for input(s): CHOL, HDL, LDLCALC, TRIG, CHOLHDL, LDLDIRECT in the last 72 hours. Thyroid Function Tests No results for input(s): TSH, T4TOTAL, T3FREE, THYROIDAB in the last 72 hours.  Invalid input(s): FREET3  Other results:   Imaging     No results found.   Medications:     Scheduled Medications: . amiodarone  200 mg Oral Daily  . amLODipine  2.5 mg Oral Daily  . budesonide (PULMICORT)  nebulizer solution  0.25 mg Nebulization BID  . calcium-vitamin D  2 tablet Oral Q breakfast  . diclofenac sodium  2 g Topical TID AC & HS  . doxycycline  100 mg Oral Q12H  . ferrous gluconate  324 mg Oral BID WC  . furosemide  60 mg Intravenous BID  . ipratropium-albuterol  3 mL Nebulization BID  . isosorbide mononitrate  60 mg Oral Daily  . mouth rinse  15 mL Mouth Rinse BID  . metoprolol tartrate  50 mg Oral BID  . pantoprazole  80 mg Oral Daily  . pregabalin  25 mg Oral QHS  . Rivaroxaban  15 mg Oral Q supper  . simvastatin  60 mg Oral q1800  . spironolactone  25 mg Oral Daily     Infusions: . cefTRIAXone (ROCEPHIN)  IV Stopped (12/24/17 1425)     PRN  Medications:  acetaminophen, albuterol, benzonatate, bisacodyl, clonazePAM, loratadine, nitroGLYCERIN, ondansetron **OR** ondansetron (ZOFRAN) IV, traZODone    Patient Profile   [name] is a 77 y.o. female with a history of chronic diastolic CHF, moderate pulmonary HTN, HTN, CAD s/p CABG, and severe COPD on home oxygen.   Admitted with COPD/ ? PNA and found to have proteus UTI. Lasix held with AKI and weight up 10 lbs.   Assessment/Plan   1. Acute on chronic diastolic CHF/valvular heart disease with RV failure:  - Last echo in 2/19 showed normal EF, mild-moderate MS, mild AI, mildly dilated RV with flattened interventricular septum suggestive of RV pressure/volume overload.   - Diuretics initially held initially and weight went up 10 lbs.  - Volume status remains elevated  - Increase lasix to 80 mg IV BID. Fluid restrict. - Weight unchanged. I/Os show positive - Strict I/Os.  - Place ted hose.  - Continue 25 mg spironolactone daily. -Fluid restrict, has been drinking a lot of fluid.  2. CAD S/P CABG: She has potential source of angina from 90% stenosis in distal LCx. The vessel is small at this point and not amenable to PCI.  - No s/s of ischemia.    - Continue statin. - On Xarelto so no ASA. - Continue Imdur.  3. Atrial fibrillation: Paroxysmal.S/P DC-CV 06/12/16, seems to have remained in NSR since then.    - Remains in NSR.  - Continue amio 200 mg daily.  - Continue Xarelto/metoprolol.  4. Pulmonary HTN: - This is primarily pulmonary venous hypertension based on prior RHC.Focus on diuresis as above.  5. COPD:  PFTs in 2/17 with moderate to severe obstruction.  - On chronic home oxygen 2 L by [place].  She was admitted with suspected COPD exacerbation, cannot rule out associated PNA.  She is on doxycycline and completed steroid course.  6. OSA:  - Intolerant to CPAP.  7. Mitral stenosis:Rheumatic mitral stenosis. -  Mild to moderate mitral stenosis by TEE in  3/17. Unable to assess by cath in 3/17 because she had aortic valve vegetation and the valve was not crossed. Unable to assess by cath in 7/17 because she had transient complete heart block when the LV was entered. Most recent echo in 2/19 also with mild to moderate mitral stenosis. Treating medically for now.  - Would be high risk for surgery with severe COPD.  - No change to current plan.   8. Aortic valve disease:  - Mild AI on echo in 2/19. No change to current plan.   9. ID: - Treated for UTI with ceftriaxone and COPD exacerbation  with doxycycline.  - No change to current plan.    Medication concerns reviewed with patient and pharmacy team. Barriers identified: None at this time.   Length of Stay: [address]  Luane School  12/25/2017, 7:49 AM  Advanced Heart Failure Team Pager [phone] (M-F; 7a - 4p)  Please contact [facility] for night-coverage after hours (4p -7a ) and weekends on [email or web address]  Patient seen with PA, agree with the above note.  She says that she is urinating more but I/Os don't appear accurate and she was weighed in the bed.  Still well above baseline weight, appears to have volume overload still on exam.  - Increase Lasix to 80 mg IV bid.  - Strict I/Os, daily weights.  - Continue to treat COPD exacerbation per [facility].   Marca Ancona 12/25/2017 2:04 PM

## 2017-12-25 NOTE — Progress Notes (Signed)
CSW referred to [facility] in [place] per request of patient and family. [facility] [facility] indicated they are not in network with [employer] and patient does not have out of network benefits.   CSW provided patient with bed offers. Patient chose [facility]. [facility] admissions starting patient's [facility] authorization today. CSW to follow and support with discharge when patient medically ready and auth received.  Abigail Butts, LCSWA [phone]

## 2017-12-25 NOTE — Progress Notes (Signed)
Occupational Therapy Treatment Patient Details Name: [name] MRN: [id number] DOB: [birth year] Today's Date: 12/25/2017    History of present illness 77 y.o. WF PMHx Anxiety, depression, COPD on 2 L O2 via [place] at rest/3 L O2 with exertion,, OSA, A. fib on anticoagulation Chronic Diastolic CHF, HTN, MV stenosis, CAD, pulmonary HTN, S/P CABG rheumatic AV disease, Hiatal Hernia, HLD. Presents with worseing SOB, likely COPD exacerbation   OT comments  Pt with limited activity tolerance, fatigues quickly during functional standing tasks. Pt required min guard assist x5 minutes standing grooming task at sink. DOE 3/4; SpO2 high 80s on 2L. Pt able to perform functional mobility with min guard assist but ambulates with forward flexed posture when fatigued. Updated d/c plan to SNF for follow up. Will continue to follow acutely.   Follow Up Recommendations  SNF;Supervision/Assistance - 24 hour    Equipment Recommendations  None recommended by OT    Recommendations for Other Services      Precautions / Restrictions Precautions Precautions: Fall Precaution Comments: watch O2 sats Restrictions Weight Bearing Restrictions: No       Mobility Bed Mobility               General bed mobility comments: Pt OOB in chair upon arrival  Transfers Overall transfer level: Needs assistance Equipment used: Rolling walker (2 wheeled) Transfers: Sit to/from Stand Sit to Stand: Min guard         General transfer comment: Good hand placement, min guard for safety    Balance Overall balance assessment: Needs assistance Sitting-balance support: Feet supported;No upper extremity supported Sitting balance-Leahy Scale: Good     Standing balance support: Single extremity supported;During functional activity Standing balance-Leahy Scale: Poor Standing balance comment: relies on UE support bilaterally to support herself.                            ADL either performed or assessed  with clinical judgement   ADL Overall ADL's : Needs assistance/impaired     Grooming: Min guard;Oral care;Standing Grooming Details (indicate cue type and reason): Able to stand at sink x5 minutes with min guard assist while completing oral care. DOE 3/4, SpO2 in high 80s on 2L             Lower Body Dressing: Min guard;Sit to/from stand   Toilet Transfer: Min guard;Ambulation;RW Toilet Transfer Details (indicate cue type and reason): Simulated by functional mobility in room. Min guard with fatigue         Functional mobility during ADLs: Min guard;Rolling walker       Vision       Perception     Praxis      Cognition Arousal/Alertness: Awake/alert Behavior During Therapy: WFL for tasks assessed/performed Overall Cognitive Status: Within Functional Limits for tasks assessed                                          Exercises     Shoulder Instructions       General Comments      Pertinent Vitals/ Pain       Pain Assessment: No/denies pain  Home Living  Prior Functioning/Environment              Frequency  Min 2X/week        Progress Toward Goals  OT Goals(current goals can now be found in the care plan section)  Progress towards OT goals: Progressing toward goals  Acute Rehab OT Goals Patient Stated Goal: return home, breathe better OT Goal Formulation: With patient  Plan Discharge plan needs to be updated    Co-evaluation                 AM-PAC PT "6 Clicks" Daily Activity     Outcome Measure   Help from another person eating meals?: None Help from another person taking care of personal grooming?: A Little Help from another person toileting, which includes using toliet, bedpan, or urinal?: A Little Help from another person bathing (including washing, rinsing, drying)?: A Little Help from another person to put on and taking off regular upper body  clothing?: None Help from another person to put on and taking off regular lower body clothing?: A Little 6 Click Score: 20    End of Session Equipment Utilized During Treatment: Rolling walker;Oxygen  OT Visit Diagnosis: Unsteadiness on feet (R26.81);Other abnormalities of gait and mobility (R26.89);Muscle weakness (generalized) (M62.81)   Activity Tolerance Patient tolerated treatment well   Patient Left in chair;with call bell/phone within reach   Nurse Communication          Time: 4035-2481 OT Time Calculation (min): 17 min  Charges: OT General Charges $OT Visit: 1 Visit OT Treatments $Self Care/Home Management : 8-22 mins  [name], M.S., OTR/L Acute Rehab Department: [phone]   Gaye Alken 12/25/2017, 1:56 PM

## 2017-12-25 NOTE — Care Management Important Message (Signed)
Important Message  Patient Details  Name: [name] MRN: [id number] Date of Birth: 1941/03/23   Medicare Important Message Given:  Yes    [name] 12/25/2017, 3:41 PM

## 2017-12-26 DIAGNOSIS — I050 Rheumatic mitral stenosis: Secondary | ICD-10-CM

## 2017-12-26 DIAGNOSIS — N3000 Acute cystitis without hematuria: Secondary | ICD-10-CM

## 2017-12-26 LAB — BASIC METABOLIC PANEL
ANION GAP: 6 (ref 5–15)
BUN: 30 mg/dL — ABNORMAL HIGH (ref 6–20)
CHLORIDE: 97 mmol/L — AB (ref 101–111)
CO2: 40 mmol/L — ABNORMAL HIGH (ref 22–32)
Calcium: 8.7 mg/dL — ABNORMAL LOW (ref 8.9–10.3)
Creatinine, Ser: 1.33 mg/dL — ABNORMAL HIGH (ref 0.44–1.00)
GFR calc Af Amer: 43 mL/min — ABNORMAL LOW (ref >60)
GFR, EST NON AFRICAN AMERICAN: 37 mL/min — AB (ref >60)
Glucose, Bld: 101 mg/dL — ABNORMAL HIGH (ref 65–99)
POTASSIUM: 4 mmol/L (ref 3.5–5.1)
SODIUM: 143 mmol/L (ref 135–145)

## 2017-12-26 LAB — CBC
HCT: 30 % — ABNORMAL LOW (ref 36.0–46.0)
HEMOGLOBIN: 8.7 g/dL — AB (ref 12.0–15.0)
MCH: 27.5 pg (ref 26.0–34.0)
MCHC: 29 g/dL — ABNORMAL LOW (ref 30.0–36.0)
MCV: 94.9 fL (ref 78.0–100.0)
PLATELETS: 234 10*3/uL (ref 150–400)
RBC: 3.16 MIL/uL — AB (ref 3.87–5.11)
RDW: 13.7 % (ref 11.5–15.5)
WBC: 13.6 10*3/uL — AB (ref 4.0–10.5)

## 2017-12-26 MED ORDER — POTASSIUM CHLORIDE CRYS ER 20 MEQ PO TBCR
40.0000 meq | EXTENDED_RELEASE_TABLET | Freq: Two times a day (BID) | ORAL | Status: DC
  Administered 2017-12-26: 40 meq via ORAL
  Filled 2017-12-26: qty 2

## 2017-12-26 MED ORDER — FUROSEMIDE 10 MG/ML IJ SOLN
80.0000 mg | Freq: Two times a day (BID) | INTRAMUSCULAR | Status: AC
  Administered 2017-12-26: 80 mg via INTRAVENOUS
  Filled 2017-12-26: qty 8

## 2017-12-26 MED ORDER — TORSEMIDE 20 MG PO TABS: 60.0000 mg | ORAL_TABLET | Freq: Two times a day (BID) | ORAL | Status: DC

## 2017-12-26 MED ORDER — AMLODIPINE BESYLATE 2.5 MG PO TABS: 5.0000 mg | ORAL_TABLET | Freq: Every day | ORAL | 3 refills | Status: DC

## 2017-12-26 NOTE — Discharge Summary (Signed)
Physician Discharge Summary  Patient ID: [name] MRN: [id number] DOB/AGE: [birth year] 78 y.o.  Admit date: 12/13/2017 Discharge date: 12/26/2017  Admission Diagnoses: Principal Problem:   Acute on chronic diastolic heart failure (HCC) Active Problems:   Coronary atherosclerosis   COPD exacerbation (HCC)   Chronic diastolic heart failure (HCC)   HTN (hypertension)   Cardiomyopathy (HCC)   Atrial fibrillation (HCC)   Mitral valve stenosis   Metabolic alkalosis   Discharge Diagnoses:  Principal Problem:   Acute on chronic diastolic heart failure (HCC) Active Problems:   Coronary atherosclerosis   COPD exacerbation (HCC)   Chronic diastolic heart failure (HCC)   HTN (hypertension)   Cardiomyopathy (HCC)   Atrial fibrillation (HCC)   Mitral valve stenosis   Metabolic alkalosis   Proteus UTI/ cystitis   Discharged Condition: good  Hospital Course:  [name] W77 y.o.female with past medical hx of anxiety/depression, COPD on 2 L O2 via [place] at rest/3 L O2 with exertion at baseline, OSA, Afib,chronicdiastolic CHF, HTN, MV stenosis, CAD S/P CABG,rheumatic AV disease, pulmonary HTN,hiatalhernia, and HLD who was admitted on 12/13/2017 with COPD exacerbation treated and resolved, then found to have Proteus mirabilis UTI as well, currently on Rocephin. Then developed CHF exacerbation currently being treated by CHF team.  Patient has significant weakness/Debility, ambulatory challenges and poor endurance persist disposition discussed with patient, patient's daughter and RN as well as case manager/SW, patient is now agreeable to go to skilled nursing facility for rehab.     Plan:- 1) Acute on chronic Diastolic Heart Failure - pulmonary hypertension/valvular heart disease with RV failure heart failure cardiology consult from Dr. Shirlee Latch appreciated, patient gained fluid volume while in hospital for COPD exacerbation - diuresed w IV lasix down 5 lbs from peak and breathing  better, only 2 lbs over outpt usual wt of 196lbs. Ok for [employer] per CHF team today.  - switch back to torsemide 60 bid at dc to SNF today - cont ted hose - cont aldactone 25 qd - cont KCL 40 bid - cont fluid restrict , Na restrict 2 gm per day and cont daily wts  2) COPD exac w/ acute on chronic resp failure - this was admitting dianosis 5/22 treated with IV steroids and nebs, no antibiotics, CXR was clear on admission. SOB improved. Pt on home O2 2L at baseline.   3) CKD III - baseline creat around 11.2  4) Paroxysmal atrial fibrillation- -CHADSVAScscore = 5, clinically stable - continue Xarelto for anticoagulation 15 mg qd - continue metoprolol 50 twice daily  - continue amiodarone 200 daily for rate/rhythm control  5)Essential hypertension-  - continue Norvasc 5 mg daily - continue metoprolol 50 mg twice daily  6)CAD status post CABG- -Stable.  No ACS type symptoms - continue imdur 60 mg  daily - zocor 60 mg daily - continue metoprolol 50 mg twice daily  7)Proteus Mirabilis UTI -  - completed course of Rocephin x 5 days - Proteus is mostly pansensitive, no fevers associated  8) Generalized weakness/Debility--- PT eval appreciated, plan is for dc to SNF for rehab  9)Depression/anxiety- -stable, continue Klonopin 0.25 mg 3 times daily as needed anxiety, may use trazodone as needed sleep  10)Heme +ve Stool--- patient is on Xarelto, H&H is stable with hemoglobin around 10, no evidence of overt bleeding, continue Protonix 50 mg daily, avoid NSAIDs  11) leukocytosis-- prob due to IV steroid therapy, no fevers    Consults: CHF team Dr Shirlee Latch   Discharge Exam:  Blood pressure (!) 131/43, pulse (!) 55, temperature 97.6 F (36.4 C), temperature source Oral, resp. rate 20, height 5\' 6"  (1.676 m), weight 90 kg (198 lb 8 oz), SpO2 100 %. Gen:- Awake Alert, in no acute distress at rest able to speak in sentences HEENT:- [place].AT, No sclera icterus Nose- [place] 3  L/min Neck-Supple Neck, +ve JVD,.  Lungs-diminished in bases, few bibasilar rales CV- S1, S2 normal, 2/6 sm Abd-  +ve B.Sounds, Abd Soft, No tenderness, increased truncal adiposity Extremity/Skin:- +1 edema bilaterally, good pulses Psych-affect is appropriate, oriented x3 Neuro-no new focal deficits, no tremors    Disposition: Discharge disposition: 03-Skilled Nursing Facility        Allergies as of 12/26/2017      Reactions   Lisinopril Cough   Developed persistent dry cough for a month.    Tape Other (See Comments)   Bleeding   Xopenex [levalbuterol] Other (See Comments)   Made mouth and throat sore   Cefpodoxime Other (See Comments)   Yeast infections    Codeine Other (See Comments)   Anxious/ jittery   Lipitor [atorvastatin] Other (See Comments)   Made joint start hurting/ Muscle aches   Other Swelling   Venholin HFA   Penicillins Other (See Comments)   Has patient had a PCN reaction causing immediate rash, facial/tongue/throat swelling, SOB or lightheadedness with hypotension: unknown, childhood reaction Has patient had a PCN reaction causing severe rash involving mucus membranes or skin necrosis: No Has patient had a PCN reaction that required hospitalization No Has patient had a PCN reaction occurring within the last 10 years: No If all of the above answers are "NO", then may proceed with Cephalosporin use.   Ventolin [kdc:albuterol] Other (See Comments)   "jittery"   Anoro Ellipta [umeclidinium-vilanterol] Itching   Clindamycin Other (See Comments)   Unknown    Hydrocodone-acetaminophen Anxiety   Latex Itching, Rash   Levalbuterol Hcl Other (See Comments)   Unknown       Medication List    TAKE these medications   acetaminophen 500 MG tablet Commonly known as:  TYLENOL Take 1,000 mg by mouth 2 (two) times daily as needed for headache.   albuterol (2.5 MG/3ML) 0.083% nebulizer solution Commonly known as:  PROVENTIL Take 3 mLs (2.5 mg total) by  nebulization every 6 (six) hours as needed for wheezing or shortness of breath.   amiodarone 200 MG tablet Commonly known as:  PACERONE Take 1 tablet (200 mg total) by mouth daily.   amLODipine 2.5 MG tablet Commonly known as:  NORVASC Take 2 tablets (5 mg total) by mouth daily. What changed:  how much to take   benzonatate 200 MG capsule Commonly known as:  TESSALON Take 1 capsule (200 mg total) by mouth 3 (three) times daily as needed for cough.   budesonide-formoterol 80-4.5 MCG/ACT inhaler Commonly known as:  SYMBICORT Inhale 2 puffs into the lungs 2 (two) times daily. What changed:  Another medication with the same name was removed. Continue taking this medication, and follow the directions you see here.   calcium-vitamin D 500-200 MG-UNIT tablet Commonly known as:  OSCAL WITH D Take 2 tablets by mouth daily with breakfast.   diclofenac sodium 1 % Gel Commonly known as:  VOLTAREN Apply 2 g topically 4 (four) times daily.   ipratropium 0.02 % nebulizer solution Commonly known as:  ATROVENT USE 1 VIAL VIA NEBULIZER  EVERY 6 HOURS AS NEEDED FOR WHEEZING OR SHORTNESS OF  BREATH   IRON PO Take  1 tablet by mouth daily as needed (supplement).   isosorbide mononitrate 60 MG 24 hr tablet Commonly known as:  IMDUR Take 1 tablet (60 mg total) daily by mouth.   loratadine 10 MG tablet Commonly known as:  CLARITIN Take 10 mg by mouth at bedtime as needed for allergies.   metoprolol tartrate 50 MG tablet Commonly known as:  LOPRESSOR Take 1 tablet (50 mg total) by mouth 2 (two) times daily.   NITROSTAT 0.4 MG SL tablet Generic drug:  nitroGLYCERIN place 1 tablet under the tongue if needed every 5 minutes for chest pain for 3 doses IF NO RELIEF AFTER 3RD DOSE CALL PRESCRIBER OR 911.   omeprazole 20 MG tablet Commonly known as:  PRILOSEC OTC Take 40 mg by mouth daily.   omeprazole 40 MG capsule Commonly known as:  PRILOSEC Take 40 mg by mouth daily.   OXYGEN Inhale  2-3 L into the lungs continuous.   potassium chloride SA 20 MEQ tablet Commonly known as:  K-DUR,KLOR-CON TAKE 2 TABLETS BY MOUTH TWO TIMES DAILY   Rivaroxaban 15 MG Tabs tablet Commonly known as:  XARELTO Take 1 tablet (15 mg total) by mouth daily with supper.   simvastatin 20 MG tablet Commonly known as:  ZOCOR TAKE 3 TABLETS BY MOUTH  DAILY AT 6 PM   spironolactone 25 MG tablet Commonly known as:  ALDACTONE TAKE 1 TABLET BY MOUTH  DAILY   torsemide 20 MG tablet Commonly known as:  DEMADEX Take 3 tablets (60 mg total) by mouth 2 (two) times daily.            Durable Medical Equipment  (From admission, onward)        Start     Ordered   12/15/17 1549  For home use only DME Nebulizer machine  Once    Comments:  Patient has failed inhalers.  Unable to coordinate use therefore medication not delivered appropriately..  Question:  Patient needs a nebulizer to treat with the following condition  Answer:  COPD (chronic obstructive pulmonary disease) (HCC)   12/15/17 1549      Contact information for follow-up providers    [facility] Follow [facility].   Specialty:  Home Health Services Why:  They will call you to set up initial visit Contact information: [address] [place] [address] [phone]        [facility]. Go on 01/03/2018.   Specialty:  Cardiology Why:  12:00 [facility], parking code 1300 Contact information: [address] [id number] Wilhemina Bonito [place] [address] [phone]           Contact information for after-discharge care    Destination    [facility] .   Service:  Skilled Nursing Contact information: [address] [place] [address] [phone]                  Signed: Maree Krabbe 12/26/2017, 2:40 PM

## 2017-12-26 NOTE — Progress Notes (Signed)
SNF has [facility] authorization for patient to admit today.  Patient will discharge to [facility] Anticipated discharge date: 12/26/17 Family notified: Olegario Messier and Asher Muir, daughters Transportation by: Asher Muir will drive patient  Nurse to call report to [phone].   CSW signing off.  Abigail Butts, LCSWA  Clinical Social Worker

## 2017-12-26 NOTE — Clinical Social Work Placement (Signed)
   CLINICAL SOCIAL WORK PLACEMENT  NOTE  Date:  12/26/2017  Patient Details  Name: [name] MRN: [id number] Date of Birth: 09/24/1940  Clinical Social Work is seeking post-discharge placement for this patient at the Skilled  Nursing Facility level of care (*CSW will initial, date and re-position this form in  chart as items are completed):  Yes   Patient/family provided with [facility] Clinical Social Work Department's list of facilities offering this level of care within the geographic area requested by the patient (or if unable, by the patient's family).  Yes   Patient/family informed of their freedom to choose among providers that offer the needed level of care, that participate in Medicare, Medicaid or managed care program needed by the patient, have an available bed and are willing to accept the patient.  Yes   Patient/family informed of [facility]'s ownership interest in [facility] and [facility], as well as of the fact that they are under no obligation to receive care at these facilities.  PASRR submitted to EDS on 12/22/17     PASRR number received on 12/22/17     Existing PASRR number confirmed on       FL2 transmitted to all facilities in geographic area requested by pt/family on 12/23/17     FL2 transmitted to all facilities within larger geographic area on       Patient informed that his/her managed care company has contracts with or will negotiate with certain facilities, including the following:  [facility]     Yes   Patient/family informed of bed offers received.  Patient chooses bed at [facility]     Physician recommends and patient chooses bed at      Patient to be transferred to [facility] on 12/26/17.  Patient to be transferred to facility by family     Patient family notified on 12/26/17 of transfer.  Name of family member notified:  Olegario Messier and Asher Muir, daughters     PHYSICIAN       Additional  Comment:    _______________________________________________ Abigail Butts, LCSW 12/26/2017, 3:12 PM

## 2017-12-26 NOTE — Progress Notes (Addendum)
Advanced Heart Failure Rounding Note  PCP-Cardiologist: No primary care provider on file.   Subjective:    Feeling better this am. Legs remains swollen. Had to be stuck 4 times this am due to intravascular depletion. Denies lightheadedness or dizziness.   Negative 2.6 L and down 2 lbs.  Weight 198. Previously 196 lbs in clinic 11/07/17 and ReDs vest 28%.   Objective:    Weight Range: 198 lb 8 oz (90 kg) Body mass index is 32.04 kg/m.   Vital Signs:   Temp:  [97.5 F (36.4 C)-97.8 F (36.6 C)] 97.6 F (36.4 C) (06/04 0331) Pulse Rate:  [55-62] 55 (06/04 0331) BP: (104-124)/(48-53) 119/53 (06/04 0331) SpO2:  [99 %-100 %] 100 % (06/04 0331) Weight:  [198 lb 8 oz (90 kg)-200 lb 11.2 oz (91 kg)] 198 lb 8 oz (90 kg) (06/04 0331) Last BM Date: 12/25/17  Weight change: Filed Weights   12/25/17 0540 12/25/17 1410 12/26/17 0331  Weight: 202 lb 6.4 oz (91.8 kg) 200 lb 11.2 oz (91 kg) 198 lb 8 oz (90 kg)   Intake/Output:   Intake/Output Summary (Last 24 hours) at 12/26/2017 0821 Last data filed at 12/26/2017 0332 Gross per 24 hour  Intake 720 ml  Output 3350 ml  Net -2630 ml    Physical Exam    General: NAD. Fatigued.  HEENT: Normal Neck: Supple. JVP 7-8 cm + with mild HJR. Carotids 2+ bilat; no bruits. No thyromegaly or nodule noted. Cor: PMI nondisplaced. RRR, 2/6 SEM RUSB with clear S2.  Lungs: Bibasilar crackles.  Abdomen: Soft, non-tender, non-distended, no HSM. No bruits or masses. +BS  Extremities: No cyanosis, clubbing, or rash. 1+ BLE edema with ted hose.  Neuro: Alert & orientedx3, cranial nerves grossly intact. moves all 4 extremities w/o difficulty. Affect pleasant   Telemetry   NSR/Sinus [place] Foley 50-60s, personally reviewed.   EKG    No new tracings.    Labs    CBC Recent Labs    12/24/17 0445 12/26/17 0508  WBC 14.6* 13.6*  HGB 9.9* 8.7*  HCT 34.1* 30.0*  MCV 95.0 94.9  PLT 249 234   Basic Metabolic Panel Recent Labs    16/10/96 0357  12/26/17 0508  NA 145 143  K 3.7 4.0  CL 101 97*  CO2 41* 40*  GLUCOSE 106* 101*  BUN 32* 30*  CREATININE 1.24* 1.33*  CALCIUM 8.4* 8.7*   Liver Function Tests No results for input(s): AST, ALT, ALKPHOS, BILITOT, PROT, ALBUMIN in the last 72 hours. No results for input(s): LIPASE, AMYLASE in the last 72 hours. Cardiac Enzymes No results for input(s): CKTOTAL, CKMB, CKMBINDEX, TROPONINI in the last 72 hours.  BNP: BNP (last 3 results) Recent Labs    12/13/17 1153  BNP 145.7*    ProBNP (last 3 results) No results for input(s): PROBNP in the last 8760 hours.   D-Dimer No results for input(s): DDIMER in the last 72 hours. Hemoglobin A1C No results for input(s): HGBA1C in the last 72 hours. Fasting Lipid Panel No results for input(s): CHOL, HDL, LDLCALC, TRIG, CHOLHDL, LDLDIRECT in the last 72 hours. Thyroid Function Tests No results for input(s): TSH, T4TOTAL, T3FREE, THYROIDAB in the last 72 hours.  Invalid input(s): FREET3  Other results:   Imaging    No results found.   Medications:     Scheduled Medications: . amiodarone  200 mg Oral Daily  . amLODipine  2.5 mg Oral Daily  . budesonide (PULMICORT) nebulizer solution  0.25 mg  Nebulization BID  . calcium-vitamin D  2 tablet Oral Q breakfast  . diclofenac sodium  2 g Topical TID AC & HS  . ferrous gluconate  324 mg Oral BID WC  . furosemide  80 mg Intravenous BID  . ipratropium-albuterol  3 mL Nebulization BID  . isosorbide mononitrate  60 mg Oral Daily  . mouth rinse  15 mL Mouth Rinse BID  . metoprolol tartrate  50 mg Oral BID  . pantoprazole  80 mg Oral Daily  . potassium chloride  40 mEq Oral Daily  . pregabalin  25 mg Oral QHS  . Rivaroxaban  15 mg Oral Q supper  . simvastatin  60 mg Oral q1800  . spironolactone  25 mg Oral Daily    Infusions:   PRN Medications: acetaminophen, albuterol, benzonatate, bisacodyl, clonazePAM, loratadine, nitroGLYCERIN, ondansetron **OR** ondansetron (ZOFRAN)  IV, traZODone    Patient Profile   LATRIA MCCARRON is a 77 y.o. female with a history of chronic diastolic CHF, moderate pulmonary HTN, HTN, CAD s/p CABG, and severe COPD on home oxygen.   Admitted with COPD/ ? PNA and found to have proteus UTI. Lasix held with AKI and weight up 10 lbs.   Assessment/Plan   1. Acute on chronic diastolic CHF/valvular heart disease with RV failure:  - Last echo in 2/19 showed normal EF, mild-moderate MS, mild AI, mildly dilated RV with flattened interventricular septum suggestive of RV pressure/volume overload.   - Diuretics initially held initially and weight went up 10 lbs.  - Volume status remains elevated, but nearing baseline.  - Give lasix 80 mg this am, then back to torsemide 60 mg BID for SNF.  - I/Os negative. Weight down 2 lbs.  - Strict I/Os.  - Continue ted hose.  - Continue 25 mg spironolactone daily. -Reinforced fluid restriction to < 2 L daily, sodium restriction to less than 2000 mg daily, and the importance of daily weights.  2. CAD S/P CABG: She has potential source of angina from 90% stenosis in distal LCx. The vessel is small at this point and not amenable to PCI.  - No s/s of ischemia.    - Continue statin. - On Xarelto so no ASA. - Continue Imdur.  3. Atrial fibrillation: Paroxysmal.S/P DC-CV 06/12/16, seems to have remained in NSR since then.    - Remains in NSR.  - Continue amio 200 mg daily.  - Continue Xarelto/metoprolol.  4. Pulmonary HTN: - This is primarily pulmonary venous hypertension based on prior RHC.Focus on diuresis as above.  - - No change to current plan.   5. COPD:  PFTs in 2/17 with moderate to severe obstruction.  - On chronic home oxygen 2 L by [place].  She was admitted with suspected COPD exacerbation, cannot rule out associated PNA.  She is on doxycycline and completed steroid course.  - Per primary.  6. OSA:  - Intolerant to CPAP.  - No change to current plan.   7. Mitral stenosis:Rheumatic  mitral stenosis. -  Mild to moderate mitral stenosis by TEE in 3/17. Unable to assess by cath in 3/17 because she had aortic valve vegetation and the valve was not crossed. Unable to assess by cath in 7/17 because she had transient complete heart block when the LV was entered. Most recent echo in 2/19 also with mild to moderate mitral stenosis. Treating medically for now.  - Would be high risk for surgery with severe COPD.  - No change to current plan.  8. Aortic valve disease:  - Mild AI on echo in 2/19. No change to current plan.   9. ID: - Treated for UTI with ceftriaxone and COPD exacerbation with doxycycline.  - No change to current plan.    Volume status improved and near baseline. Give IV lasix this am. She has a SNF bed. Suspect she would be OK for discharge this evening with close follow up. Will schedule  HF meds for home (Same as PTA) Torsemide 60 mg BID Amlodipine 5 mg daily Amiodarone 200 mg daily Imdur 60 mg daily Lopressor 50 mg BID Potassium 40 meq BID Spironolactone 25 mg daily Xarelto 15 mg daily  Medication concerns reviewed with patient and pharmacy team. Barriers identified: None at this time.   Length of Stay: [address]  Graciella Freer, [place]  12/26/2017, 8:21 AM  Advanced Heart Failure Team Pager [phone] (M-F; 7a - 4p)  Please contact [facility] for night-coverage after hours (4p -7a ) and weekends on [email or web address]  Patient seen with PA, agree with the above note.  Volume status is looking better.  She got IV Lasix this morning, may transition to her home torsemide dose, 60 mg bid.  From my perspective, she may go to SNF if bed is available. See above cardiac med list for discharge.   Marca Ancona 12/26/2017 2:36 PM

## 2017-12-26 NOTE — Progress Notes (Signed)
[facility] has received [facility] authorization for patient and would be ready to admit her today. CSW to follow and support with discharge when medically ready.  Abigail Butts, LCSWA [phone]

## 2018-01-03 ENCOUNTER — Encounter (HOSPITAL_COMMUNITY): Payer: Medicare Other

## 2018-01-08 ENCOUNTER — Telehealth: Payer: Self-pay | Admitting: Internal Medicine

## 2018-01-08 NOTE — Telephone Encounter (Signed)
Spoke with pt. She is aware of CY's response. States that her symptoms did start recently. Nothing further was needed at this time.

## 2018-01-08 NOTE — Telephone Encounter (Signed)
Patient returning call, CB is [phone]. Patient states she is going to lay down now and to call back after 2:15 pm today.

## 2018-01-08 NOTE — Telephone Encounter (Signed)
Attempted to call pt. I did not receive an answer. I have left a message for pt to return our call.  

## 2018-01-08 NOTE — Telephone Encounter (Signed)
Spoke with pt. States that she is having some issues. Reports sore throat, headache and insomnia. Denies chest tightness, wheezing, SOB, coughing. Pt is on Albuterol and Ipratropium nebs. She is wanting to know if these could be causing her symptoms.  CY - please advise. Thanks.  Allergies  Allergen Reactions  . Lisinopril Cough    Developed persistent dry cough for a month.   . Tape Other (See Comments)    Bleeding  . Xopenex [Levalbuterol] Other (See Comments)    Made mouth and throat sore  . Cefpodoxime Other (See Comments)    Yeast infections   . Codeine Other (See Comments)    Anxious/ jittery  . Lipitor [Atorvastatin] Other (See Comments)    Made joint start hurting/ Muscle aches  . Other Swelling    Venholin HFA  . Penicillins Other (See Comments)    Has patient had a PCN reaction causing immediate rash, facial/tongue/throat swelling, SOB or lightheadedness with hypotension: unknown, childhood reaction Has patient had a PCN reaction causing severe rash involving mucus membranes or skin necrosis: No Has patient had a PCN reaction that required hospitalization No Has patient had a PCN reaction occurring within the last 10 years: No If all of the above answers are "NO", then may proceed with Cephalosporin use.   Terald Sleeper [Kdc:Albuterol] Other (See Comments)    "jittery"  . Anoro Ellipta [Umeclidinium-Vilanterol] Itching  . Clindamycin Other (See Comments)    Unknown   . Hydrocodone-Acetaminophen Anxiety  . Latex Itching and Rash  . Levalbuterol Hcl Other (See Comments)    Unknown    Current Outpatient Medications on File Prior to Visit  Medication Sig Dispense Refill  . acetaminophen (TYLENOL) 500 MG tablet Take 1,000 mg by mouth 2 (two) times daily as needed for headache.     . albuterol (PROVENTIL) (2.5 MG/3ML) 0.083% nebulizer solution Take 3 mLs (2.5 mg total) by nebulization every 6 (six) hours as needed for wheezing or shortness of breath. (Patient not taking:  Reported on 11/29/2017) 75 mL 12  . amiodarone (PACERONE) 200 MG tablet Take 1 tablet (200 mg total) by mouth daily. 90 tablet 3  . amLODipine (NORVASC) 2.5 MG tablet Take 2 tablets (5 mg total) by mouth daily. 90 tablet 3  . benzonatate (TESSALON) 200 MG capsule Take 1 capsule (200 mg total) by mouth 3 (three) times daily as needed for cough. 90 capsule 0  . budesonide-formoterol (SYMBICORT) 80-4.5 MCG/ACT inhaler Inhale 2 puffs into the lungs 2 (two) times daily. (Patient not taking: Reported on 12/13/2017) 3 Inhaler 1  . calcium-vitamin D (OSCAL WITH D) 500-200 MG-UNIT per tablet Take 2 tablets by mouth daily with breakfast.    . diclofenac sodium (VOLTAREN) 1 % GEL Apply 2 g topically 4 (four) times daily. 100 g 0  . ipratropium (ATROVENT) 0.02 % nebulizer solution USE 1 VIAL VIA NEBULIZER  EVERY 6 HOURS AS NEEDED FOR WHEEZING OR SHORTNESS OF  BREATH (Patient not taking: Reported on 11/29/2017) 812.5 mL 0  . IRON PO Take 1 tablet by mouth daily as needed (supplement).     . isosorbide mononitrate (IMDUR) 60 MG 24 hr tablet Take 1 tablet (60 mg total) daily by mouth. 90 tablet 3  . loratadine (CLARITIN) 10 MG tablet Take 10 mg by mouth at bedtime as needed for allergies.     . metoprolol tartrate (LOPRESSOR) 50 MG tablet Take 1 tablet (50 mg total) by mouth 2 (two) times daily. 14 tablet 0  . NITROSTAT 0.4  MG SL tablet place 1 tablet under the tongue if needed every 5 minutes for chest pain for 3 doses IF NO RELIEF AFTER 3RD DOSE CALL PRESCRIBER OR 911. 25 tablet 0  . omeprazole (PRILOSEC OTC) 20 MG tablet Take 40 mg by mouth daily.     Marland Kitchen omeprazole (PRILOSEC) 40 MG capsule Take 40 mg by mouth daily.  12  . OXYGEN Inhale 2-3 L into the lungs continuous.     . potassium chloride SA (K-DUR,KLOR-CON) 20 MEQ tablet TAKE 2 TABLETS BY MOUTH TWO TIMES DAILY 360 tablet 6  . Rivaroxaban (XARELTO) 15 MG TABS tablet Take 1 tablet (15 mg total) by mouth daily with supper. 90 tablet 3  . simvastatin (ZOCOR) 20 MG  tablet TAKE 3 TABLETS BY MOUTH  DAILY AT 6 PM 270 tablet 3  . spironolactone (ALDACTONE) 25 MG tablet TAKE 1 TABLET BY MOUTH  DAILY 90 tablet 3  . torsemide (DEMADEX) 20 MG tablet Take 3 tablets (60 mg total) by mouth 2 (two) times daily. 540 tablet 3   No current facility-administered medications on file prior to visit.

## 2018-01-08 NOTE — Telephone Encounter (Signed)
Are symptoms recent, as if maybe from a cold? The albuterol neb Might causese insomnia, especially if used before bed. Suggest - try using just the ipratropium neb solution for awhile and see if that is better.  Ok to use throat lozenges, tylenol and similar comfort meds for discomfort as needed

## 2018-01-12 ENCOUNTER — Ambulatory Visit (HOSPITAL_COMMUNITY)
Admission: RE | Admit: 2018-01-12 | Discharge: 2018-01-12 | Disposition: A | Discharge status: Home / Self Care | Payer: Medicare Other | Source: Ambulatory Visit | Attending: Cardiology | Admitting: Cardiology

## 2018-01-12 ENCOUNTER — Telehealth: Payer: Self-pay | Admitting: Internal Medicine

## 2018-01-12 VITALS — BP 124/72 | HR 66 | Wt 198.4 lb

## 2018-01-12 DIAGNOSIS — Z79899 Other long term (current) drug therapy: Secondary | ICD-10-CM | POA: Diagnosis not present

## 2018-01-12 DIAGNOSIS — I480 Paroxysmal atrial fibrillation: Secondary | ICD-10-CM | POA: Insufficient documentation

## 2018-01-12 DIAGNOSIS — J441 Chronic obstructive pulmonary disease with (acute) exacerbation: Secondary | ICD-10-CM | POA: Insufficient documentation

## 2018-01-12 DIAGNOSIS — E785 Hyperlipidemia, unspecified: Secondary | ICD-10-CM | POA: Insufficient documentation

## 2018-01-12 DIAGNOSIS — Z7901 Long term (current) use of anticoagulants: Secondary | ICD-10-CM | POA: Insufficient documentation

## 2018-01-12 DIAGNOSIS — G4733 Obstructive sleep apnea (adult) (pediatric): Secondary | ICD-10-CM | POA: Diagnosis not present

## 2018-01-12 DIAGNOSIS — I358 Other nonrheumatic aortic valve disorders: Secondary | ICD-10-CM | POA: Diagnosis not present

## 2018-01-12 DIAGNOSIS — I272 Pulmonary hypertension, unspecified: Secondary | ICD-10-CM

## 2018-01-12 DIAGNOSIS — Z9981 Dependence on supplemental oxygen: Secondary | ICD-10-CM | POA: Insufficient documentation

## 2018-01-12 DIAGNOSIS — Z951 Presence of aortocoronary bypass graft: Secondary | ICD-10-CM | POA: Insufficient documentation

## 2018-01-12 DIAGNOSIS — I5032 Chronic diastolic (congestive) heart failure: Secondary | ICD-10-CM | POA: Diagnosis not present

## 2018-01-12 DIAGNOSIS — I050 Rheumatic mitral stenosis: Secondary | ICD-10-CM | POA: Diagnosis not present

## 2018-01-12 DIAGNOSIS — J9611 Chronic respiratory failure with hypoxia: Secondary | ICD-10-CM | POA: Diagnosis not present

## 2018-01-12 DIAGNOSIS — Z792 Long term (current) use of antibiotics: Secondary | ICD-10-CM | POA: Insufficient documentation

## 2018-01-12 DIAGNOSIS — R9431 Abnormal electrocardiogram [ECG] [EKG]: Secondary | ICD-10-CM | POA: Insufficient documentation

## 2018-01-12 DIAGNOSIS — I4510 Unspecified right bundle-branch block: Secondary | ICD-10-CM | POA: Diagnosis not present

## 2018-01-12 DIAGNOSIS — J449 Chronic obstructive pulmonary disease, unspecified: Secondary | ICD-10-CM | POA: Diagnosis not present

## 2018-01-12 DIAGNOSIS — I2510 Atherosclerotic heart disease of native coronary artery without angina pectoris: Secondary | ICD-10-CM | POA: Insufficient documentation

## 2018-01-12 DIAGNOSIS — M7989 Other specified soft tissue disorders: Secondary | ICD-10-CM | POA: Diagnosis not present

## 2018-01-12 DIAGNOSIS — K219 Gastro-esophageal reflux disease without esophagitis: Secondary | ICD-10-CM | POA: Insufficient documentation

## 2018-01-12 DIAGNOSIS — R001 Bradycardia, unspecified: Secondary | ICD-10-CM | POA: Insufficient documentation

## 2018-01-12 DIAGNOSIS — I519 Heart disease, unspecified: Secondary | ICD-10-CM

## 2018-01-12 DIAGNOSIS — Z87891 Personal history of nicotine dependence: Secondary | ICD-10-CM | POA: Diagnosis not present

## 2018-01-12 DIAGNOSIS — I110 Hypertensive heart disease with heart failure: Secondary | ICD-10-CM | POA: Insufficient documentation

## 2018-01-12 DIAGNOSIS — Z09 Encounter for follow-up examination after completed treatment for conditions other than malignant neoplasm: Secondary | ICD-10-CM | POA: Insufficient documentation

## 2018-01-12 LAB — COMPREHENSIVE METABOLIC PANEL
ALBUMIN: 3.4 g/dL — AB (ref 3.5–5.0)
ALK PHOS: 72 U/L (ref 38–126)
ALT: 18 U/L (ref 14–54)
ANION GAP: 10 (ref 5–15)
AST: 17 U/L (ref 15–41)
BUN: 18 mg/dL (ref 6–20)
CALCIUM: 9.2 mg/dL (ref 8.9–10.3)
CHLORIDE: 94 mmol/L — AB (ref 101–111)
CO2: 38 mmol/L — AB (ref 22–32)
Creatinine, Ser: 1.31 mg/dL — ABNORMAL HIGH (ref 0.44–1.00)
GFR calc non Af Amer: 38 mL/min — ABNORMAL LOW (ref >60)
GFR, EST AFRICAN AMERICAN: 44 mL/min — AB (ref >60)
GLUCOSE: 67 mg/dL (ref 65–99)
Potassium: 3.7 mmol/L (ref 3.5–5.1)
SODIUM: 142 mmol/L (ref 135–145)
Total Bilirubin: 0.7 mg/dL (ref 0.3–1.2)
Total Protein: 6.8 g/dL (ref 6.5–8.1)

## 2018-01-12 LAB — CBC
HEMATOCRIT: 30.8 % — AB (ref 36.0–46.0)
Hemoglobin: 8.9 g/dL — ABNORMAL LOW (ref 12.0–15.0)
MCH: 27.2 pg (ref 26.0–34.0)
MCHC: 28.9 g/dL — ABNORMAL LOW (ref 30.0–36.0)
MCV: 94.2 fL (ref 78.0–100.0)
Platelets: 224 10*3/uL (ref 150–400)
RBC: 3.27 MIL/uL — AB (ref 3.87–5.11)
RDW: 15.1 % (ref 11.5–15.5)
WBC: 5.9 10*3/uL (ref 4.0–10.5)

## 2018-01-12 LAB — TSH: TSH: 1.86 u[IU]/mL (ref 0.350–4.500)

## 2018-01-12 MED ORDER — PREDNISONE 20 MG PO TABS: 40.0000 mg | ORAL_TABLET | Freq: Every day | ORAL | 0 refills | Status: AC

## 2018-01-12 MED ORDER — DOXYCYCLINE HYCLATE 50 MG PO CAPS: 100.0000 mg | ORAL_CAPSULE | Freq: Two times a day (BID) | ORAL | 0 refills | Status: AC

## 2018-01-12 MED ORDER — TORSEMIDE 20 MG PO TABS: 80.0000 mg | ORAL_TABLET | Freq: Two times a day (BID) | ORAL | 3 refills | Status: DC

## 2018-01-12 NOTE — Telephone Encounter (Signed)
Noted.  Will close encounter.  

## 2018-01-12 NOTE — Progress Notes (Signed)
ReDS Vest - 01/12/18 1100      ReDS Vest   MR   No    Estimated volume prior to reading  Med    Fitting Posture  Sitting    Height Marker  Tall    Ruler Value  14    Center Strip  Aligned    ReDS Value  32

## 2018-01-12 NOTE — Telephone Encounter (Signed)
Patient's son, Kathlene November, returned the call.  Per Florentina Addison, okay to schedule with NP for Monday.  Patient has been scheduled for 06/24 with TP.  No call back is needed.

## 2018-01-12 NOTE — Patient Instructions (Addendum)
Start Prednisone 40 mg (2 tabs) daily for 5 days  Start Doxycycline 100 mg (2 tabs), twice a day for 7 days.   Increase Torsemide 80 mg (4 tabs), twice a day   Labs drawn today (if we do not call you, then your lab work was stable)   A chest x-ray takes a picture of the organs and structures inside the chest, including the heart, lungs, and blood vessels. This test can show several things, including, whether the heart is enlarges; whether fluid is building up in the lungs; and whether pacemaker / defibrillator leads are still in place.  Your physician recommends that you schedule a follow-up appointment in: 2 weeks with APP

## 2018-01-12 NOTE — Telephone Encounter (Signed)
Attempted to call pt on the number that was listed and spoke with pt's husband who states pt was not currently at home.  Attempted to contact pt on mobile number but unable to reach pt at that number and unable to leave VM due to it not being set up.  Attempted to contact [facility] to see if pt was still at office but pt had left already.  Attempted to call pt's home number back but unable to reach pt. Left a message for pt to return call so that way we can get an appt scheduled for her.

## 2018-01-13 NOTE — Progress Notes (Signed)
Patient ID: [name], female   DOB: 04-Jul-1941, 77 y.o.   MRN: [id number]    Advanced Heart Failure Clinic Note   PCP: Dr Valentina Lucks  Pulmonary: Dr Maple Hudson HF Cardiology: Dr. Shirlee Latch  HPI: [name] is a 77 y.o. female with a history of chronic diastolic CHF, moderate pulmonary HTN, HTN, CAD s/p CABG, COPD on home oxygen, and dyslipidemia referred to the HF clinic by Dr Mayford Knife.    Admitted 3/2 - 09/30/15 with A/C CHF. TEE was done to assess mitral stenosis, appeared mild to moderate, rheumatic valve.  TEE also showed aortic valve vegetation and concern arose for endocarditis with recent "tooth infection" per pt.  ID saw. 1 of 2 admission cultures with Gram + cocci in clusters thought to be contaminant.  2 further sets of BCx negative. RHC showed elevated right and left heart filling pressures.  Aortic valve was not crossed for full mitral stenosis assessment due to aortic valve vegetation. Diuresed 8 lbs. Discharge weight 175 lbs.   Due to increased dyspnea and exertional chest pain, she had RHC/LHC in 7/17.  Bypass grafts were patent but there was 90% stenosis in the distal LCx that could account for angina.  It was a small caliber vessel and not amenable to PCI. Filling pressures were near-normal on RHC.   Admitted 11/18 through 06/18/16 with chest pain and increased dyspnea. Exacerbation was thought to be from A fib RVR. She underwent successful DC/CV on 11/19. Diuresed with IV lasix and transitioned to  Discharged on amio 200 mg twice a day+ xarelto 15 mg daily. Discharge weight was 175 pounds.    Echo in 12/17 was stable with EF 55-60% and mild to moderate mitral stenosis, mild MR, mild AS.    Last echo in 2/19 showed EF 55-60% with septal flattening and mild RV dilation, moderate TR, mild AI, mild-moderate mitral stenosis mean gradient 6 mmHg, PHT 1.33 cm^2.    She was admitted in 6/19 with COPD exacerbation.  Diuretic was held while in hospital and she developed volume overload requiring  IV diuresis.  She was discharged to SNF but only stayed 2 days.    She returns for followup of CHF. She continues to use 2L oxygen by nasal cannula.  She has become more short of breath over the last several days.  She is now short of breath mildly at rest and more significantly with any exertion.  Legs swelling but not markedly.  Weight is up 2 lbs compared to last appointment here.  No fever.  No coughing/wheezing.  No chest pain.   REDS vest reading 32% today.   ECG (personally reviewed): NSR, LAFB, RBBB  - ECHO 12/2014 EF 55-60%, peak PA pressure 68 mmHg, mild to moderate MS. - ECHO 2/17 EF 60-65%, mild LVH, mildly dilated RV with mildly decreased systolic function, PASP 29 mmHg (not sure we got a full envelope), moderate mitral stenosis with mean gradient 6 mmHg and MVA 1.37 cm^2, mild MR, mild AI, aortic sclerosis without significant stenosis.   - TEE 09/24/15 EF 60%, small 0.8 cm mobile mass on the LV side of the aortic valve. Mild AI and AS. 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 A small PFO with a weakly positive bubble study, Moderate TR - Echo (12/17): EF 55-60%, mild aortic stenosis, mild aortic insufficiency, rheumatic mitral valve with mild to moderate mitral stenosis (MVA 1.6 cm^2, mean gradient 8 mmHg), mild MR, moderate TR, PASP  68 mmHg.  - Echo (2/19): EF 55-60% with septal flattening and mild RV dilation, moderate TR, mild AI, mild-moderate mitral stenosis mean gradient 6 mmHg, PHT 1.33 cm^2.   PFTs (2/17): moderate-severe obstructive airways disease.   Sleep study 2017 was negative, no significant OSA.   RHC 09/24/15 (did not do LHC and cross valve to assess mitral stenosis due to aortic valve vegetation): Hemodynamics (mmHg) RA mean 16 RV 65/14 PA 71/28, mean 45 PCWP mean 32 Cardiac Output (Fick) 4.85  Cardiac Index (Fick) 2.51 PVR 2.68 WU  LHC/RHC (7/17): 50% pLAD, patent LIMA-LAD, totally occluded PLOM, patent SVG-PLOM,  AV LCx distal to PLOM with 90% stenosis (small caliber), totally occluded PLV, patent SVG-PLV.  RA mean 5 PA 43/13 PCWP mean 17 CI 2.7 PVR 1.9 WU Transient CHB when LV was entered, so unable to measure gradient across MV.   Labs 01/09/2015 K 4.3 Creatinine 0.91 BNP  Labs 02/19/2015 K 4.1 Creatinine 1.03 Labs 2/17 LDL 82 Labs 10/05/15 K 3.8, Creatinine 0.89  Labs 10/08/15 K 4.6, Creatinine 0.7 Labs 6/17 K 4.3, creatinine 0.95, HCT 32.7, BNP 221 Labs 7/17 BNP 286, K 4.2, creatinine 1.0, HCT 31.2 Labs 10/17 K 3.5, creatinine 1.18, LDL 78 Labs 1/18 K 4.2, creatinine 1.5 Labs 3/18: BNP 175, K 3.5, creatinine 1.54 Labs 6/19: K 4, creatinine 1.33  Past Medical History:  Diagnosis Date  . Acute parotitis 01/05/2016  . Anxiety   . Bacteremia, escherichia coli   . Bursitis, shoulder    bilateral  . CHF (congestive heart failure) (HCC)    chronic diasotlic CHF  . Complication of anesthesia    "have trouble getting me awake sometimes; been ok latelhy" (09/24/2015)  . COPD (chronic obstructive pulmonary disease) (HCC)   . Coronary heart disease 2001   s/p CABG  . Depression   . DJD (degenerative joint disease)   . GERD (gastroesophageal reflux disease)   . H/O hiatal hernia   . History of rheumatic heart disease    X 3-LAST TIME WHEN PATIENT WAS 77 YRS OLD  . Hyperlipidemia   . Hypertension   . OA (osteoarthritis)   . On home oxygen therapy    "2L; 24/7" (09/24/2015)  . PUD (peptic ulcer disease)   . Pulmonary HTN (HCC)    PASP 50-61mmHg by [id number]  . Sleep apnea    intolerant to CPAP  . Thyroid nodule   . Urinary incontinence   . Varicose veins     Social History: The patient  reports that she quit smoking about 7 years ago. Her smoking use included Cigarettes. She has a 47 pack-year smoking history. She has never used smokeless tobacco. She reports that she does not drink alcohol or use illicit drugs.   Family History: The patient's family history includes Anemia in her  mother; Emphysema in her father; Esophageal cancer in her daughter.   ROS: All systems negative except as listed in HPI, PMH and Problem List.  Current Outpatient Medications  Medication Sig Dispense Refill  . acetaminophen (TYLENOL) 500 MG tablet Take 1,000 mg by mouth 2 (two) times daily as needed for headache.     Marland Kitchen amiodarone (PACERONE) 200 MG tablet Take 1 tablet (200 mg total) by mouth daily. 90 tablet 3  . amLODipine (NORVASC) 2.5 MG tablet Take 2 tablets (5 mg total) by mouth daily. 90 tablet 3  . benzonatate (TESSALON) 200 MG capsule Take 1 capsule (200 mg total) by mouth 3 (three) times daily as needed for cough. 90  capsule 0  . calcium-vitamin D (OSCAL WITH D) 500-200 MG-UNIT per tablet Take 2 tablets by mouth daily with breakfast.    . diclofenac sodium (VOLTAREN) 1 % GEL Apply 2 g topically 4 (four) times daily. 100 g 0  . IRON PO Take 1 tablet by mouth daily as needed (supplement).     . isosorbide mononitrate (IMDUR) 60 MG 24 hr tablet Take 1 tablet (60 mg total) daily by mouth. 90 tablet 3  . loratadine (CLARITIN) 10 MG tablet Take 10 mg by mouth at bedtime as needed for allergies.     . metoprolol tartrate (LOPRESSOR) 50 MG tablet Take 1 tablet (50 mg total) by mouth 2 (two) times daily. 14 tablet 0  . NITROSTAT 0.4 MG SL tablet place 1 tablet under the tongue if needed every 5 minutes for chest pain for 3 doses IF NO RELIEF AFTER 3RD DOSE CALL PRESCRIBER OR 911. 25 tablet 0  . omeprazole (PRILOSEC) 40 MG capsule Take 40 mg by mouth daily.  12  . OXYGEN Inhale 2-3 L into the lungs continuous.     . potassium chloride SA (K-DUR,KLOR-CON) 20 MEQ tablet TAKE 2 TABLETS BY MOUTH TWO TIMES DAILY 360 tablet 6  . Rivaroxaban (XARELTO) 15 MG TABS tablet Take 1 tablet (15 mg total) by mouth daily with supper. 90 tablet 3  . simvastatin (ZOCOR) 20 MG tablet TAKE 3 TABLETS BY MOUTH  DAILY AT 6 PM 270 tablet 3  . spironolactone (ALDACTONE) 25 MG tablet TAKE 1 TABLET BY MOUTH  DAILY 90  tablet 3  . torsemide (DEMADEX) 20 MG tablet Take 4 tablets (80 mg total) by mouth 2 (two) times daily. 240 tablet 3  . albuterol (PROVENTIL) (2.5 MG/3ML) 0.083% nebulizer solution Take 3 mLs (2.5 mg total) by nebulization every 6 (six) hours as needed for wheezing or shortness of breath. (Patient not taking: Reported on 11/29/2017) 75 mL 12  . budesonide-formoterol (SYMBICORT) 80-4.5 MCG/ACT inhaler Inhale 2 puffs into the lungs 2 (two) times daily. (Patient not taking: Reported on 12/13/2017) 3 Inhaler 1  . doxycycline (VIBRAMYCIN) 50 MG capsule Take 2 capsules (100 mg total) by mouth 2 (two) times daily for 7 days. 28 capsule 0  . ipratropium (ATROVENT) 0.02 % nebulizer solution USE 1 VIAL VIA NEBULIZER  EVERY 6 HOURS AS NEEDED FOR WHEEZING OR SHORTNESS OF  BREATH (Patient not taking: Reported on 11/29/2017) 812.5 mL 0  . omeprazole (PRILOSEC OTC) 20 MG tablet Take 40 mg by mouth daily.     . predniSONE (DELTASONE) 20 MG tablet Take 2 tablets (40 mg total) by mouth daily with breakfast for 5 days. 10 tablet 0   No current facility-administered medications for this encounter.     Vitals:   01/12/18 1048  BP: 124/72  Pulse: 66  SpO2: 92%  Weight: 198 lb 6.4 oz (90 kg)   Wt Readings from Last 3 Encounters:  01/12/18 198 lb 6.4 oz (90 kg)  12/26/17 198 lb 8 oz (90 kg)  11/29/17 193 lb (87.5 kg)     PHYSICAL EXAM: General: NAD, frail Neck: JVP about 8 cm, no thyromegaly or thyroid nodule.  Lungs: Slight crackles at bases bilaterally.  CV: Nondisplaced PMI.  Heart regular S1/S2, no S3/S4, 2/6 SEM RUSB with clear S2.  1+ edema 1/2 to knees bilaterally.  No carotid bruit.  Normal pedal pulses.  Abdomen: Soft, nontender, no hepatosplenomegaly, no distention.  Skin: Intact without lesions or rashes.  Neurologic: Alert and oriented x 3.  Psych: Normal affect. Extremities: No clubbing or cyanosis.  HEENT: Normal.   ASSESSMENT & PLAN: 1. Chronic diastolic CHF/valvular heart disease with RV  failure: TEE in 3/17 showed normal EF, mild to moderate mitral stenosis.  Most recent RHC in 7/17 showed near-normal filling pressures.  Last echo in 2/19 showed normal EF, mild-moderate MS, mild AI, flattened interventricular septum suggestive of RV failure.  Today, NYHA IIIb symptoms, worse over the last few days.  On exam, she does not look markedly volume overloaded and REDS vest reading is only 32%.  I am concerned that this may be a COPD exacerbation primarily.   - Increase torsemide to 80 mg bid for now, BMET today and again in 10 days.  - Continue 25 mg spironolactone daily. 2. CAD S/P CABG:  She has potential source of angina from 90% stenosis in distal LCx.  The vessel is small at this point and not amenable to PCI.  No chest pain.  - Continue statin  - On Xarelto so no Aspirin. - Continue Imdur.  3. Atrial fibrillation: Paroxysmal. CHADSVASC score is 4 (age > 27, female, HTN, CHF).  S/P DC-CV 06/12/16. She is in NSR today.  - Continue amio to 200 mg once a day. Will need to check TSH, LFTs today. She understands she should get get yearly eye exam.  - Continue Xarelto/metoprolol.  4. Pulmonary HTN: This is primarily pulmonary venous hypertension based on last RHC. 5. COPD: Followed by Dr Maple Hudson. PFTs in 2/17 with moderate to severe obstruction.  On 2 liters [place]. She has not tolerated inhalers well. As above, I am concerned that COPD exacerbation is the main cause for current worsened breathing.   - CXR to assess for PNA.  - Will give her prednisone 40 mg daily x 5 days.  - Doxycycline 100 mg bid x 7 days.  - Will try to arrange an appointment with pulmonary asap.  6.  OSA: intolerant CPAP 7.  Mitral stenosis: Rheumatic mitral stenosis.  Mild to moderate mitral stenosis by TEE in 3/17.  Unable to assess by cath in 3/17 because she had aortic valve vegetation and the valve was not crossed. Unable to assess by cath in 7/17 because she had transient complete heart block when the LV was  entered. Treating medically for now, still mild to moderate MS on 2/19 echo.  Would be high risk for surgery with severe COPD.  However, possibly could be valvuloplasty candidate.    8. Aortic valve disease: Mild AI on last echo.   Followup in 10 days or so with NP/PA. Needs pulmonary appt soon, will try to arrange.  If symptoms worsen, will need admission.   Marca Ancona 01/13/2018

## 2018-01-15 ENCOUNTER — Ambulatory Visit: Payer: Medicare Other | Admitting: Adult Health

## 2018-01-15 ENCOUNTER — Encounter: Payer: Self-pay | Admitting: Adult Health

## 2018-01-15 VITALS — BP 134/68 | HR 63

## 2018-01-15 DIAGNOSIS — J9611 Chronic respiratory failure with hypoxia: Secondary | ICD-10-CM

## 2018-01-15 DIAGNOSIS — J441 Chronic obstructive pulmonary disease with (acute) exacerbation: Secondary | ICD-10-CM

## 2018-01-15 DIAGNOSIS — D649 Anemia, unspecified: Secondary | ICD-10-CM

## 2018-01-15 DIAGNOSIS — J449 Chronic obstructive pulmonary disease, unspecified: Secondary | ICD-10-CM | POA: Diagnosis not present

## 2018-01-15 MED ORDER — IPRATROPIUM BROMIDE 0.02 % IN SOLN: 0.5000 mg | Freq: Four times a day (QID) | RESPIRATORY_TRACT | 0 refills | Status: DC

## 2018-01-15 NOTE — Assessment & Plan Note (Signed)
Slowly resolving flare with decompensated CHF and progressive anemia  Needs to discuss with PCP anemia as may be contributing factor  Will have her finish abx and steroids  Cont on diuresis  Unable to tolerate inhaler or SABA.  Advised to use scheduled LAMA neb  May use Albuterol neb , try 1/2 vial to see if more tolerable , pt education on medication side effects.    Plan  Patient Instructions  Needs follow up with PCP for Anemia .  Begin Ipratropium Neb Four times a day  .  Use Albuterol Neb As needed  For wheezing /shortness of breath .  Finish doxycycline  and prednisone.  Continue on Oxygen 2l/m .  Begin PT at home .  Follow up with Dr. Maple Hudson  Or Parrett NP in 4 weeks and As needed   Please contact office for sooner follow up if symptoms do not improve or worsen or seek emergency care

## 2018-01-15 NOTE — Patient Instructions (Addendum)
Needs follow up with PCP for Anemia .  Begin Ipratropium Neb Four times a day  .  Use Albuterol Neb As needed  For wheezing /shortness of breath .  Finish doxycycline  and prednisone.  Continue on Oxygen 2l/m .  Begin PT at home .  Follow up with Dr. Maple Hudson  Or Florian Chauca NP in 4 weeks and As needed   Please contact office for sooner follow up if symptoms do not improve or worsen or seek emergency care

## 2018-01-15 NOTE — Assessment & Plan Note (Signed)
hbg tr down on anticoagulation , hospital records note positive occult stool test  follow up with PCP .

## 2018-01-15 NOTE — Assessment & Plan Note (Signed)
Cont on o2 .  

## 2018-01-15 NOTE — Progress Notes (Signed)
@Patient  ID: [name], female    DOB: October 27, 1940, 77 y.o.   MRN: [id number]  Chief Complaint  Patient presents with  . Follow-up    COPD     Referring provider: Maurice Small, MD  HPI: 77 year old female followed for COPD, chronic rhinitis, PLMS. Lung nodules  Past medical history significant for bipolar hypertension, congestive heart failure, coronary disease and A. Fib, anemia  TEST  PFT 10/26/16-severe obstructive airways disease, insignificant response, severe restriction, severe diffusion defect. FVC 1.38/46%, FEV1 0.98/43%, ratio 0.71, TLC 69%, DLCO 42% 2D echo February 2019 showed EF 55 to 60%, grade 3 diastolic dysfunction,  01/15/2018 Follow up ; COPD flare  Patient presents for a follow-up.  Patient was recently seen by cardiology on June 21.  Patient was diagnosed with a COPD exacerbation.  She was given doxycycline and prednisone.   Chest x-ray showed COPD changes without acute process.  She was also given extra torsemide for possible volume overload component. Patient says she is feeling slightly better but remains very weak. Not coughing up much mucus .   She was started on Symbicort last month but is not longer taking . Did not like the coating on her throat. Previously tried on [facility] but unable to [employer]  Has tried albuterol but says it makes her too jittery .  Currently taking atrovent nebs couple times a day .   She is on Oxygen2l/m   Pt had Flare in last May and was admitted.  She was treated for COPD exacerbation and CHF decompensation.  Along with a urinary tract infection. Says since admission she has remained very weak, has hard time walking and getting around .    Allergies  Allergen Reactions  . Lisinopril Cough    Developed persistent dry cough for a month.   . Tape Other (See Comments)    Bleeding  . Xopenex [Levalbuterol] Other (See Comments)    Made mouth and throat sore  . Cefpodoxime Other (See Comments)    Yeast infections   .  Codeine Other (See Comments)    Anxious/ jittery  . Lipitor [Atorvastatin] Other (See Comments)    Made joint start hurting/ Muscle aches  . Other Swelling    Venholin HFA  . Penicillins Other (See Comments)    Has patient had a PCN reaction causing immediate rash, facial/tongue/throat swelling, SOB or lightheadedness with hypotension: unknown, childhood reaction Has patient had a PCN reaction causing severe rash involving mucus membranes or skin necrosis: No Has patient had a PCN reaction that required hospitalization No Has patient had a PCN reaction occurring within the last 10 years: No If all of the above answers are "NO", then may proceed with Cephalosporin use.   Terald Sleeper [Kdc:Albuterol] Other (See Comments)    "jittery"  . Anoro Ellipta [Umeclidinium-Vilanterol] Itching  . Clindamycin Other (See Comments)    Unknown   . Hydrocodone-Acetaminophen Anxiety  . Latex Itching and Rash  . Levalbuterol Hcl Other (See Comments)    Unknown     Immunization History  Administered Date(s) Administered  . Influenza Split 04/03/2012, 04/15/2014  . Influenza Whole 04/01/2011  . Influenza, High Dose Seasonal PF 04/18/2016, 04/24/2017  . Influenza,inj,Quad PF,6+ Mos 05/09/2013, 04/16/2015  . Pneumococcal Conjugate-13 06/24/2014  . Pneumococcal Polysaccharide-23 07/25/2008  . Tdap 05/25/2012    Past Medical History:  Diagnosis Date  . Acute parotitis 01/05/2016  . Anxiety   . Bacteremia, escherichia coli   . Bursitis, shoulder    bilateral  .  CHF (congestive heart failure) (HCC)    chronic diasotlic CHF  . Complication of anesthesia    "have trouble getting me awake sometimes; been ok latelhy" (09/24/2015)  . COPD (chronic obstructive pulmonary disease) (HCC)   . Coronary heart disease 2001   s/p CABG  . Depression   . DJD (degenerative joint disease)   . GERD (gastroesophageal reflux disease)   . H/O hiatal hernia   . History of rheumatic heart disease    X 3-LAST TIME WHEN  PATIENT WAS 77 YRS OLD  . Hyperlipidemia   . Hypertension   . OA (osteoarthritis)   . On home oxygen therapy    "2L; 24/7" (09/24/2015)  . PUD (peptic ulcer disease)   . Pulmonary HTN (HCC)    PASP 50-28mmHg by [id number]  . Sleep apnea    intolerant to CPAP  . Thyroid nodule   . Urinary incontinence   . Varicose veins     Tobacco History: Social History   Tobacco Use  Smoking Status Former Smoker  . Packs/day: 1.00  . Years: 47.00  . Pack years: 47.00  . Types: Cigarettes  . Last attempt to quit: 07/26/2007  . Years since quitting: 10.4  Smokeless Tobacco Never Used   Counseling given: Not Answered   Outpatient Encounter Medications as of 01/15/2018  Medication Sig  . acetaminophen (TYLENOL) 500 MG tablet Take 1,000 mg by mouth 2 (two) times daily as needed for headache.   Marland Kitchen amiodarone (PACERONE) 200 MG tablet Take 1 tablet (200 mg total) by mouth daily.  Marland Kitchen amLODipine (NORVASC) 2.5 MG tablet Take 2 tablets (5 mg total) by mouth daily.  . benzonatate (TESSALON) 200 MG capsule Take 1 capsule (200 mg total) by mouth 3 (three) times daily as needed for cough.  . calcium-vitamin D (OSCAL WITH D) 500-200 MG-UNIT per tablet Take 2 tablets by mouth daily with breakfast.  . diclofenac sodium (VOLTAREN) 1 % GEL Apply 2 g topically 4 (four) times daily.  Marland Kitchen doxycycline (VIBRAMYCIN) 50 MG capsule Take 2 capsules (100 mg total) by mouth 2 (two) times daily for 7 days.  Marland Kitchen ipratropium (ATROVENT) 0.02 % nebulizer solution Take 2.5 mLs (0.5 mg total) by nebulization 4 (four) times daily. Dx: J44.9  . IRON PO Take 1 tablet by mouth daily as needed (supplement).   . isosorbide mononitrate (IMDUR) 60 MG 24 hr tablet Take 1 tablet (60 mg total) daily by mouth.  . loratadine (CLARITIN) 10 MG tablet Take 10 mg by mouth at bedtime as needed for allergies.   . metoprolol tartrate (LOPRESSOR) 50 MG tablet Take 1 tablet (50 mg total) by mouth 2 (two) times daily.  Marland Kitchen NITROSTAT 0.4 MG SL tablet place 1  tablet under the tongue if needed every 5 minutes for chest pain for 3 doses IF NO RELIEF AFTER 3RD DOSE CALL PRESCRIBER OR 911.  . omeprazole (PRILOSEC) 40 MG capsule Take 40 mg by mouth daily.  . OXYGEN Inhale 2-3 L into the lungs continuous.   . potassium chloride SA (K-DUR,KLOR-CON) 20 MEQ tablet TAKE 2 TABLETS BY MOUTH TWO TIMES DAILY  . predniSONE (DELTASONE) 20 MG tablet Take 2 tablets (40 mg total) by mouth daily with breakfast for 5 days.  . Rivaroxaban (XARELTO) 15 MG TABS tablet Take 1 tablet (15 mg total) by mouth daily with supper.  . simvastatin (ZOCOR) 20 MG tablet TAKE 3 TABLETS BY MOUTH  DAILY AT 6 PM  . spironolactone (ALDACTONE) 25 MG tablet TAKE 1 TABLET BY  MOUTH  DAILY  . torsemide (DEMADEX) 20 MG tablet Take 4 tablets (80 mg total) by mouth 2 (two) times daily.  . [DISCONTINUED] ipratropium (ATROVENT) 0.02 % nebulizer solution Take 0.5 mg by nebulization every 4 (four) hours as needed for wheezing or shortness of breath.  . [DISCONTINUED] albuterol (PROVENTIL) (2.5 MG/3ML) 0.083% nebulizer solution Take 3 mLs (2.5 mg total) by nebulization every 6 (six) hours as needed for wheezing or shortness of breath. (Patient not taking: Reported on 01/15/2018)  . [DISCONTINUED] budesonide-formoterol (SYMBICORT) 80-4.5 MCG/ACT inhaler Inhale 2 puffs into the lungs 2 (two) times daily. (Patient not taking: Reported on 01/15/2018)  . [DISCONTINUED] ipratropium (ATROVENT) 0.02 % nebulizer solution USE 1 VIAL VIA NEBULIZER  EVERY 6 HOURS AS NEEDED FOR WHEEZING OR SHORTNESS OF  BREATH (Patient not taking: Reported on 01/15/2018)  . [DISCONTINUED] omeprazole (PRILOSEC OTC) 20 MG tablet Take 40 mg by mouth daily.    No facility-administered encounter medications on file as of 01/15/2018.      Review of Systems  Constitutional:   No  weight loss, night sweats,  Fevers, chills,  +fatigue, or  lassitude.  HEENT:   No headaches,  Difficulty swallowing,  Tooth/dental problems, or  Sore throat,                 No sneezing, itching, ear ache,  +nasal congestion, post nasal drip,   CV:  No chest pain,  Orthopnea, PND, swelling in lower extremities, anasarca, dizziness, palpitations, syncope.   GI  No heartburn, indigestion, abdominal pain, nausea, vomiting, diarrhea, change in bowel habits, loss of appetite, bloody stools.   Resp:    No chest wall deformity  Skin: no rash or lesions.  GU: no dysuria, change in color of urine, no urgency or frequency.  No flank pain, no hematuria   MS:  No joint pain or swelling.  No decreased range of motion.  No back pain.    Physical Exam  BP 134/68 (BP Location: Right Arm, Cuff Size: Normal)   Pulse 63   SpO2 94%   GEN: A/Ox3; pleasant , NAD, elderly in wc  ,on o2    HEENT:  [place]/AT,  EACs-clear, TMs-wnl, NOSE-clear, THROAT-clear, no lesions, no postnasal drip or exudate noted.   NECK:  Supple w/ fair ROM; no JVD; normal carotid impulses w/o bruits; no thyromegaly or nodules palpated; no lymphadenopathy.    RESP  Clear  P & A; w/o, wheezes/ rales/ or rhonchi. no accessory muscle use, no dullness to percussion  CARD:  RRR, no m/r/g, -1 +  peripheral edema, pulses intact, no cyanosis or clubbing.  GI:   Soft & nt; nml bowel sounds; no organomegaly or masses detected.   Musco: Warm bil, no deformities or joint swelling noted.   Neuro: alert, no focal deficits noted.    Skin: Warm, no lesions or rashes    Lab Results:  CBC    Component Value Date/Time   WBC 5.9 01/12/2018 1122   RBC 3.27 (L) 01/12/2018 1122   HGB 8.9 (L) 01/12/2018 1122   HCT 30.8 (L) 01/12/2018 1122   PLT 224 01/12/2018 1122   MCV 94.2 01/12/2018 1122   MCH 27.2 01/12/2018 1122   MCHC 28.9 (L) 01/12/2018 1122   RDW 15.1 01/12/2018 1122   LYMPHSABS 2.0 10/19/2017 2314   MONOABS 1.0 10/19/2017 2314   EOSABS 0.3 10/19/2017 2314   BASOSABS 0.0 10/19/2017 2314    BMET    Component Value Date/Time   NA 142 01/12/2018  1122   K 3.7 01/12/2018 1122   CL 94  (L) 01/12/2018 1122   CO2 38 (H) 01/12/2018 1122   GLUCOSE 67 01/12/2018 1122   BUN 18 01/12/2018 1122   CREATININE 1.31 (H) 01/12/2018 1122   CREATININE 1.00 (H) 01/27/2016 0754   CALCIUM 9.2 01/12/2018 1122   GFRNONAA 38 (L) 01/12/2018 1122   GFRAA 44 (L) 01/12/2018 1122    BNP    Component Value Date/Time   BNP 145.7 (H) 12/13/2017 1153   BNP 285.9 (H) 01/27/2016 0754    ProBNP    Component Value Date/Time   PROBNP 245.0 (H) 01/09/2015 0907    Imaging: Dg Chest 2 View  Result Date: 01/12/2018 CLINICAL DATA:  Shortness of breath EXAM: CHEST - 2 VIEW COMPARISON:  Dec 19, 2017 and Dec 13, 2017 FINDINGS: There is bibasilar atelectasis. There is no edema or consolidation. Heart is mildly enlarged with pulmonary vascularity normal. No adenopathy. There is aortic atherosclerosis. Patient is status post coronary artery bypass grafting. There are surgical clips in the right cervical-thoracic junction region. Bones are osteoporotic. There is anterior wedging of the L1 vertebral body, stable. IMPRESSION: Bibasilar atelectasis. No edema or consolidation. Stable cardiac prominence. Aortic atherosclerosis. Status post coronary artery bypass grafting. Aortic Atherosclerosis (ICD10-I70.0). Electronically Signed   By: Bretta Bang III M.D.   On: 01/12/2018 13:34   Dg Chest Port 1 View  Result Date: 12/19/2017 CLINICAL DATA:  Exacerbation of COPD.  Shortness of breath. EXAM: PORTABLE CHEST 1 VIEW COMPARISON:  12/13/2017 FINDINGS: Previous median sternotomy and CABG. Basilar lung markings are worsened, particularly on the right, consistent with basilar pneumonia. Upper lungs remain clear. No visible effusions. No acute bone finding. Previous thyroid surgery. IMPRESSION: Worsened basilar lung markings figure limb on the right most consistent with basilar pneumonia. Electronically Signed   By: Paulina Fusi M.D.   On: 12/19/2017 07:43     Assessment & Plan:   COPD exacerbation (HCC) Slowly  resolving flare with decompensated CHF and progressive anemia  Needs to discuss with PCP anemia as may be contributing factor  Will have her finish abx and steroids  Cont on diuresis  Unable to tolerate inhaler or SABA.  Advised to use scheduled LAMA neb  May use Albuterol neb , try 1/2 vial to see if more tolerable , pt education on medication side effects.    Plan  Patient Instructions  Needs follow up with PCP for Anemia .  Begin Ipratropium Neb Four times a day  .  Use Albuterol Neb As needed  For wheezing /shortness of breath .  Finish doxycycline  and prednisone.  Continue on Oxygen 2l/m .  Begin PT at home .  Follow up with Dr. Maple Hudson  Or Parrett NP in 4 weeks and As needed   Please contact office for sooner follow up if symptoms do not improve or worsen or seek emergency care        Chronic respiratory failure with hypoxia (HCC) Cont on o2 .   Anemia hbg tr down on anticoagulation , hospital records note positive occult stool test  follow up with PCP .       Rubye Oaks, NP 01/15/2018

## 2018-01-16 ENCOUNTER — Telehealth: Payer: Self-pay | Admitting: Internal Medicine

## 2018-01-16 NOTE — Telephone Encounter (Signed)
Spoke with Diane. She stated that the patient was referred to [facility] with a diagnosis of anemia. They needed a copy of her lab results. Advised her I would fax a copy of her recent CBC and occult card that was positive over to the fax number provided.   Nothing else needed at time of call.

## 2018-01-18 ENCOUNTER — Telehealth (HOSPITAL_COMMUNITY): Payer: Self-pay | Admitting: Cardiology

## 2018-01-18 ENCOUNTER — Encounter (HOSPITAL_COMMUNITY): Payer: Self-pay

## 2018-01-18 ENCOUNTER — Emergency Department (HOSPITAL_COMMUNITY)
Admission: EM | Admit: 2018-01-18 | Discharge: 2018-01-18 | Disposition: A | Discharge status: Home / Self Care | Payer: Medicare Other | Attending: Emergency Medicine | Admitting: Emergency Medicine

## 2018-01-18 ENCOUNTER — Other Ambulatory Visit: Payer: Self-pay

## 2018-01-18 ENCOUNTER — Telehealth: Payer: Self-pay | Admitting: Internal Medicine

## 2018-01-18 DIAGNOSIS — D649 Anemia, unspecified: Secondary | ICD-10-CM | POA: Diagnosis not present

## 2018-01-18 DIAGNOSIS — R609 Edema, unspecified: Secondary | ICD-10-CM

## 2018-01-18 DIAGNOSIS — R600 Localized edema: Secondary | ICD-10-CM | POA: Insufficient documentation

## 2018-01-18 DIAGNOSIS — N289 Disorder of kidney and ureter, unspecified: Secondary | ICD-10-CM | POA: Diagnosis not present

## 2018-01-18 DIAGNOSIS — R0602 Shortness of breath: Secondary | ICD-10-CM | POA: Diagnosis present

## 2018-01-18 LAB — BASIC METABOLIC PANEL
ANION GAP: 12 (ref 5–15)
BUN: 42 mg/dL — ABNORMAL HIGH (ref 8–23)
CHLORIDE: 92 mmol/L — AB (ref 98–111)
CO2: 37 mmol/L — AB (ref 22–32)
Calcium: 8.8 mg/dL — ABNORMAL LOW (ref 8.9–10.3)
Creatinine, Ser: 1.38 mg/dL — ABNORMAL HIGH (ref 0.44–1.00)
GFR calc non Af Amer: 36 mL/min — ABNORMAL LOW (ref >60)
GFR, EST AFRICAN AMERICAN: 41 mL/min — AB (ref >60)
Glucose, Bld: 80 mg/dL (ref 70–99)
POTASSIUM: 3.9 mmol/L (ref 3.5–5.1)
Sodium: 141 mmol/L (ref 135–145)

## 2018-01-18 LAB — CBC WITH DIFFERENTIAL/PLATELET
ABS IMMATURE GRANULOCYTES: 0.1 10*3/uL (ref 0.0–0.1)
Basophils Absolute: 0 10*3/uL (ref 0.0–0.1)
Basophils Relative: 1 %
EOS ABS: 0.1 10*3/uL (ref 0.0–0.7)
Eosinophils Relative: 1 %
HCT: 29.6 % — ABNORMAL LOW (ref 36.0–46.0)
HEMOGLOBIN: 8.5 g/dL — AB (ref 12.0–15.0)
IMMATURE GRANULOCYTES: 1 %
LYMPHS ABS: 1.7 10*3/uL (ref 0.7–4.0)
LYMPHS PCT: 20 %
MCH: 27.3 pg (ref 26.0–34.0)
MCHC: 28.7 g/dL — ABNORMAL LOW (ref 30.0–36.0)
MCV: 95.2 fL (ref 78.0–100.0)
Monocytes Absolute: 1.4 10*3/uL — ABNORMAL HIGH (ref 0.1–1.0)
Monocytes Relative: 17 %
NEUTROS ABS: 5.2 10*3/uL (ref 1.7–7.7)
NEUTROS PCT: 60 %
Platelets: 288 10*3/uL (ref 150–400)
RBC: 3.11 MIL/uL — AB (ref 3.87–5.11)
RDW: 16.2 % — AB (ref 11.5–15.5)
WBC: 8.5 10*3/uL (ref 4.0–10.5)

## 2018-01-18 LAB — TYPE AND SCREEN
ABO/RH(D): O POS
ANTIBODY SCREEN: NEGATIVE

## 2018-01-18 LAB — BRAIN NATRIURETIC PEPTIDE: B Natriuretic Peptide: 254.3 pg/mL — ABNORMAL HIGH (ref 0.0–100.0)

## 2018-01-18 NOTE — ED Provider Notes (Signed)
[facility] EMERGENCY DEPARTMENT Provider Note   CSN: [id number] Arrival date & time: 01/18/18  1241     History   Chief Complaint Chief Complaint  Patient presents with  . Shortness of Breath  . Leg Swelling    HPI [name] is a 77 y.o. female.  HPI   She presents for evaluation of persistent swelling and legs for several weeks, feeling cold, and having ongoing shortness of breath.  She describes gaining weight during recent hospitalization that persisted while she was in rehab.  She saw her cardiologist, 1 week ago and her pulmonologist, several days ago.  Is being maintained on medications for COPD and CHF, and states her weight has improved to 194 pounds, but she is trying to get to 192.  She is able to eat.  She denies fever, chills, focal weakness or paresthesia.  She is due to see a GI doctor next week to be evaluated for GI bleeding, with decreasing hemoglobin.  There are no other no modifying factors.  Past Medical History:  Diagnosis Date  . Acute parotitis 01/05/2016  . Anxiety   . Bacteremia, escherichia coli   . Bursitis, shoulder    bilateral  . CHF (congestive heart failure) (HCC)    chronic diasotlic CHF  . Complication of anesthesia    "have trouble getting me awake sometimes; been ok latelhy" (09/24/2015)  . COPD (chronic obstructive pulmonary disease) (HCC)   . Coronary heart disease 2001   s/p CABG  . Depression   . DJD (degenerative joint disease)   . GERD (gastroesophageal reflux disease)   . H/O hiatal hernia   . History of rheumatic heart disease    X 3-LAST TIME WHEN PATIENT WAS 77 YRS OLD  . Hyperlipidemia   . Hypertension   . OA (osteoarthritis)   . On home oxygen therapy    "2L; 24/7" (09/24/2015)  . PUD (peptic ulcer disease)   . Pulmonary HTN (HCC)    PASP 50-67mmHg by [id number]  . Sleep apnea    intolerant to CPAP  . Thyroid nodule   . Urinary incontinence   . Varicose veins     Patient Active Problem List   Diagnosis Date Noted  . Respiratory distress 12/13/2017  . Metabolic alkalosis 12/13/2017  . Gait disturbance 11/21/2017  . Lung nodule 11/07/2016  . Mitral valve stenosis   . Rheumatic aortic valve disease   . Atrial fibrillation (HCC) 06/12/2016  . Anemia   . Chronic respiratory failure with hypoxia (HCC) 04/20/2016  . Rheumatic mitral valve disease   . PFO (patent foramen ovale)   . Cardiomyopathy (HCC)   . Acute on chronic diastolic heart failure (HCC)   . Chronic atrial fibrillation (HCC)   . Alcoholic cardiomyopathy (HCC)   . Aortic valve vegetation 09/24/2015  . Lumbar spine scoliosis 04/18/2015  . Mitral stenosis 02/10/2015  . Chronic diastolic heart failure (HCC) 05/10/2013  . Atrial flutter (HCC) 05/10/2013  . Chronic anticoagulation 05/10/2013  . HTN (hypertension) 05/10/2013  . Dyslipidemia, goal LDL below 70 05/10/2013  . Seasonal allergic rhinitis 04/11/2012  . DYSPNEA 03/14/2008  . Coronary atherosclerosis 02/27/2008  . COPD exacerbation (HCC) 02/27/2008  . Pulmonary hypertension (HCC) 02/21/2008  . DIASTOLIC DYSFUNCTION 02/21/2008    Past Surgical History:  Procedure Laterality Date  . BILATERAL SALPINGOOPHORECTOMY    . CARDIAC CATHETERIZATION  02/11/2008, 01/30/2004, 06/14/2000  . CARDIAC CATHETERIZATION N/A 09/24/2015   Procedure: Right Heart Cath;  Surgeon: Laurey Morale, MD;  Location: [facility] INVASIVE CV LAB;  Service: Cardiovascular;  Laterality: N/A;  . CARDIAC CATHETERIZATION N/A 02/08/2016   Procedure: Right/Left Heart Cath and Coronary/Graft Angiography;  Surgeon: Laurey Morale, MD;  Location: [facility] INVASIVE CV LAB;  Service: Cardiovascular;  Laterality: N/A;  . CARDIOVERSION N/A 06/12/2016   Procedure: CARDIOVERSION;  Surgeon: Duke Salvia, MD;  Location: [facility] OR;  Service: Cardiovascular;  Laterality: N/A;  . CORONARY ARTERY BYPASS GRAFT  06/21/2000   x4  . INCONTINENCE SURGERY     "tacked"  . TEE WITHOUT CARDIOVERSION N/A 09/24/2015   Procedure:  TRANSESOPHAGEAL ECHOCARDIOGRAM (TEE);  Surgeon: Laurey Morale, MD;  Location: [facility] ENDOSCOPY;  Service: Cardiovascular;  Laterality: N/A;  . THYROIDECTOMY, PARTIAL  2010   Dr Michaell Cowing  . TOTAL ABDOMINAL HYSTERECTOMY  1990   1990  . TUBAL LIGATION    . VAGINAL DELIVERY  x4     OB History    Gravida  4   Para  4   Term      Preterm      AB      Living        SAB      TAB      Ectopic      Multiple      Live Births               Home Medications    Prior to Admission medications   Medication Sig Start Date End Date Taking? Authorizing Provider  acetaminophen (TYLENOL) 500 MG tablet Take 1,000 mg by mouth 2 (two) times daily as needed for headache.    Yes [provider]  amiodarone (PACERONE) 200 MG tablet Take 1 tablet (200 mg total) by mouth daily. 12/11/17  Yes Laurey Morale, MD  amLODipine (NORVASC) 2.5 MG tablet Take 2 tablets (5 mg total) by mouth daily. Patient taking differently: Take 2.5 mg by mouth daily.  12/26/17  Yes Delano Metz, MD  benzonatate (TESSALON) 200 MG capsule Take 1 capsule (200 mg total) by mouth 3 (three) times daily as needed for cough. 08/26/16  Yes Clegg, Amy D, NP  calcium-vitamin D (OSCAL WITH D) 500-200 MG-UNIT per tablet Take 2 tablets by mouth 2 (two) times daily.    Yes [provider]  clonazePAM (KLONOPIN) 0.5 MG tablet Take 0.5 mg by mouth at bedtime.  01/05/18  Yes [provider]  diclofenac sodium (VOLTAREN) 1 % GEL Apply 2 g topically 4 (four) times daily. Patient taking differently: Apply 2 g topically daily as needed (pain).  10/20/17  Yes Melene Plan, DO  doxycycline (VIBRAMYCIN) 50 MG capsule Take 2 capsules (100 mg total) by mouth 2 (two) times daily for 7 days. 01/12/18 01/19/18 Yes Laurey Morale, MD  ipratropium (ATROVENT) 0.02 % nebulizer solution Take 2.5 mLs (0.5 mg total) by nebulization 4 (four) times daily. Dx: J44.9 01/15/18  Yes Parrett, Tammy S, NP  IRON PO Take 27 mg by mouth daily.     Yes [provider]  isosorbide mononitrate (IMDUR) 60 MG 24 hr tablet Take 1 tablet (60 mg total) daily by mouth. Patient taking differently: Take 60 mg by mouth daily at 6 PM.  05/30/17  Yes Laurey Morale, MD  loratadine (CLARITIN) 10 MG tablet Take 10 mg by mouth daily.    Yes [provider]  metoprolol tartrate (LOPRESSOR) 50 MG tablet Take 1 tablet (50 mg total) by mouth 2 (two) times daily. 09/01/17  Yes Laurey Morale, MD  NITROSTAT 0.4 MG SL tablet place 1 tablet under the tongue if needed every 5 minutes for chest pain for 3 doses IF NO RELIEF AFTER 3RD DOSE CALL PRESCRIBER OR 911. 01/21/16  Yes Turner, Cornelious Bryant, MD  omeprazole (PRILOSEC) 40 MG capsule Take 40 mg by mouth daily. 11/22/17  Yes [provider]  OXYGEN Inhale 2-3 L into the lungs continuous. At rest 2 liters  Moving around 3 liters   Yes [provider]  potassium chloride SA (K-DUR,KLOR-CON) 20 MEQ tablet TAKE 2 TABLETS BY MOUTH TWO TIMES DAILY 11/28/16  Yes Bensimhon, Bevelyn Buckles, MD  Rivaroxaban (XARELTO) 15 MG TABS tablet Take 1 tablet (15 mg total) by mouth daily with supper. 10/26/17  Yes Laurey Morale, MD  simvastatin (ZOCOR) 20 MG tablet TAKE 3 TABLETS BY MOUTH  DAILY AT 6 PM 05/30/17  Yes Laurey Morale, MD  spironolactone (ALDACTONE) 25 MG tablet TAKE 1 TABLET BY MOUTH  DAILY 05/30/17  Yes Laurey Morale, MD  torsemide (DEMADEX) 20 MG tablet Take 4 tablets (80 mg total) by mouth 2 (two) times daily. 01/12/18  Yes Laurey Morale, MD    Family History Family History  Problem Relation Age of Onset  . Anemia Mother   . Emphysema Father   . Esophageal cancer Daughter     Social History Social History   Tobacco Use  . Smoking status: Former Smoker    Packs/day: 1.00    Years: 47.00    Pack years: 47.00    Types: Cigarettes    Last attempt to quit: 07/26/2007    Years since quitting: 10.4  . Smokeless tobacco: Never Used  Substance Use Topics  . Alcohol use: No  . Drug  use: No     Allergies   Lisinopril; Tape; Xopenex [levalbuterol]; Cefpodoxime; Codeine; Lipitor [atorvastatin]; Other; Penicillins; Ventolin [kdc:albuterol]; Anoro ellipta [umeclidinium-vilanterol]; Clindamycin; Hydrocodone-acetaminophen; Latex; and Levalbuterol hcl   Review of Systems Review of Systems  All other systems reviewed and are negative.    Physical Exam Updated Vital Signs BP (!) 122/33   Pulse (!) 58   Temp 98.4 F (36.9 C) (Oral)   Resp (!) 22   Ht 5\' 6"  (1.676 m)   Wt 88.4 kg (194 lb 12.8 oz)   SpO2 100%   BMI 31.44 kg/m   Physical Exam  Constitutional: She is oriented to person, place, and time. She appears well-developed. She does not appear ill.  Elderly, frail  HENT:  Head: Normocephalic and atraumatic.  Right Ear: External ear normal.  Left Ear: External ear normal.  Eyes: Pupils are equal, round, and reactive to light. Conjunctivae and EOM are normal.  Pale conjunctiva  Neck: Normal range of motion and phonation normal. Neck supple.  Cardiovascular: Normal rate, regular rhythm and normal heart sounds.  Pulmonary/Chest: Effort normal and breath sounds normal. She exhibits no bony tenderness.  Abdominal: Soft. There is no tenderness.  Musculoskeletal: Normal range of motion. She exhibits edema (2+ pitting edema lower legs bilaterally).  Neurological: She is alert and oriented to person, place, and time. No cranial nerve deficit or sensory deficit. She exhibits normal muscle tone. Coordination normal.  Skin: Skin is warm, dry and intact.  Psychiatric: She has a normal mood and affect. Her behavior is normal. Judgment and thought content normal.  Nursing note and vitals reviewed.    ED Treatments / Results  Labs (all labs ordered are listed, but only abnormal results are displayed) Labs Reviewed  BASIC METABOLIC PANEL -  Abnormal; Notable for the following components:      Result Value   Chloride 92 (*)    CO2 37 (*)    BUN 42 (*)     Creatinine, Ser 1.38 (*)    Calcium 8.8 (*)    GFR calc non Af Amer 36 (*)    GFR calc Af Amer 41 (*)    All other components within normal limits  CBC WITH DIFFERENTIAL/PLATELET - Abnormal; Notable for the following components:   RBC 3.11 (*)    Hemoglobin 8.5 (*)    HCT 29.6 (*)    MCHC 28.7 (*)    RDW 16.2 (*)    Monocytes Absolute 1.4 (*)    All other components within normal limits  BRAIN NATRIURETIC PEPTIDE - Abnormal; Notable for the following components:   B Natriuretic Peptide 254.3 (*)    All other components within normal limits  URINALYSIS, ROUTINE W REFLEX MICROSCOPIC  TYPE AND SCREEN   Hemoglobin  Date Value Ref Range Status  01/18/2018 8.5 (L) 12.0 - 15.0 g/dL Final  40/98/1191 8.9 (L) 12.0 - 15.0 g/dL Final  47/82/9562 8.7 (L) 12.0 - 15.0 g/dL Final  13/02/6577 9.9 (L) 12.0 - 15.0 g/dL Final   BUN  Date Value Ref Range Status  01/18/2018 42 (H) 8 - 23 mg/dL Final    Comment:    Please note change in reference range.  01/12/2018 18 6 - 20 mg/dL Final  46/96/2952 30 (H) 6 - 20 mg/dL Final  84/13/2440 32 (H) 6 - 20 mg/dL Final   Creat  Date Value Ref Range Status  01/27/2016 1.00 (H) 0.60 - 0.93 mg/dL Final    Comment:      For patients > or = 78 years of age: The upper reference limit for Creatinine is approximately 13% higher for people identified as African-American.      Creatinine, Ser  Date Value Ref Range Status  01/18/2018 1.38 (H) 0.44 - 1.00 mg/dL Final  05/21/2535 6.44 (H) 0.44 - 1.00 mg/dL Final  03/47/4259 5.63 (H) 0.44 - 1.00 mg/dL Final  87/56/4332 9.51 (H) 0.44 - 1.00 mg/dL Final     EKG EKG Interpretation  Date/Time:  Thursday January 18 2018 12:52:55 EDT Ventricular Rate:  62 PR Interval:    QRS Duration: 175 QT Interval:  506 QTC Calculation: 514 R Axis:   -77 Text Interpretation:  Sinus rhythm Ventricular premature complex Short PR interval RBBB and LAFB Probable left ventricular hypertrophy Baseline wander in lead(s) V2  Artifact since last tracing no significant change Confirmed by Mancel Bale [phone]) on 01/18/2018 2:18:54 PM   Radiology No results found.  Procedures Procedures (including critical care time)  Medications Ordered in ED Medications - No data to display   Initial Impression / Assessment and Plan / ED Course  I have reviewed the triage vital signs and the nursing notes.  Pertinent labs & imaging results that were available during my care of the patient were reviewed by me and considered in my medical decision making (see chart for details).  Clinical Course as of Jan 19 1824  Thu Jan 18, 2018  1823 Abnormal, mild elevation  Brain natriuretic peptide(!) [EW]  1823 Normal except chloride low, CO2 high, BUN high, creatinine high, calcium low, GFR low  Basic metabolic panel(!) [EW]  1823 Normal except hemoglobin low  CBC with Differential(!) [EW]    Clinical Course User Index [EW] Mancel Bale, MD     Patient Vitals for the past 24  hrs:  BP Temp Temp src Pulse Resp SpO2 Height Weight  01/18/18 1530 (!) 122/33 - - (!) 58 (!) 22 100 % - -  01/18/18 1400 (!) 116/44 - - 62 16 100 % - -  01/18/18 1300 (!) 120/43 - - (!) 59 18 100 % - -  01/18/18 1254 116/80 98.4 F (36.9 C) Oral 64 (!) 29 97 % 5\' 6"  (1.676 m) 88.4 kg (194 lb 12.8 oz)  01/18/18 1247 - - - - - 98 % - -    6:36 PM Reevaluation with update and discussion. After initial assessment and treatment, an updated evaluation reveals she is alert and fairly comfortable.  Oxygen saturation 98%, on supplement oxygen as per her usual.  We discussed treatment of leg swelling, from peripheral edema, which is not felt to be central.  Patient has compression stockings at home but has not been wearing them.  Findings discussed with patient and family members, all questions answered. Mancel Bale   Medical Decision Making: Nonspecific shortness of breath, with chronic symptoms, and leg edema.  Patient's weight, currently is reassuring.   Doubt acute congestive heart failure, metabolic instability or impending vascular collapse.  CRITICAL CARE-no Performed by: Mancel Bale   Nursing Notes Reviewed/ Care Coordinated Applicable Imaging Reviewed Interpretation of Laboratory Data incorporated into ED treatment  The patient appears reasonably screened and/or stabilized for discharge and I doubt any other medical condition or other [facility] requiring further screening, evaluation, or treatment in the ED at this time prior to discharge.  Plan: Home Medications-continue current home medications; Home Treatments-elevate legs frequently during the day and use compression stockings to improve edema.; return here if the recommended treatment, does not improve the symptoms; Recommended follow up-follow-up PCP, GI, pulmonary and cardiology as planned and scheduled.    Final Clinical Impressions(s) / ED Diagnoses   Final diagnoses:  Peripheral edema  Anemia, unspecified type  Renal insufficiency    ED Discharge Orders    None       Mancel Bale, MD 01/19/18 1718

## 2018-01-18 NOTE — ED Triage Notes (Signed)
Pt arrived via [facility] from home c/o bilateral pedal edema and SOB x1 month.  Pt directed by PCP to come to ED.  Pt takes 80mg  BID Lasix.  Pt wears 2L O2 [place] at home recently increased to 3-4L constant.  Pt recently admitted to hospital.

## 2018-01-18 NOTE — Discharge Instructions (Addendum)
Elevate your legs above your heart 3 or 4 times a day for up to 1 hour.  Use can compression stockings on your lower legs, above the knees, to help the leg swelling.  Return here, if needed, for problems.

## 2018-01-18 NOTE — Telephone Encounter (Signed)
Called and spoke with Fleet Contras with [facility]; she is in home nurse for pt. Fleet Contras advised that pt last OV was with TP on 01/15/2018 Fleet Contras reports pt has pitted edema in legs, swollen legs and hands, lungs sound clear, denies SOB, feet are cold. She states that pt pulse was 129, had duoneb at 7am, O2 sats on 2 liters dropped to 80%, desated again to 86%. Pt states her breathing has worsen, and not feeling any better since last OV with TP.  Advised patient and Fleet Contras that pt will need to go to ED today. Fleet Contras advised that she will call EMS immediatly.  Nothing further needed at this time.  Routing message to [facility] for review.

## 2018-01-18 NOTE — ED Notes (Signed)
Pt repositioned and cleaned after  pur wick was not working for pt cleaned pt up and replaced pur wick.

## 2018-01-18 NOTE — Telephone Encounter (Signed)
Rachael with [facility] called during patients HH visit to report increased SOB and BLE 3+, sats in high 80's- now 95%  Above symptoms were also given to pulmonology and pulm advised patient to report to ER for further evaluation.  Advised HHRN to have patient report to Er as advised by pulmonology,

## 2018-01-23 ENCOUNTER — Other Ambulatory Visit (HOSPITAL_COMMUNITY): Payer: Self-pay | Admitting: Cardiology

## 2018-01-25 ENCOUNTER — Inpatient Hospital Stay (HOSPITAL_COMMUNITY)
Admission: EM | Admit: 2018-01-25 | Discharge: 2018-02-01 | DRG: 291 | Disposition: A | Discharge status: Skilled Nursing Facility | Payer: Medicare Other | Attending: Family Medicine | Admitting: Family Medicine

## 2018-01-25 ENCOUNTER — Emergency Department (HOSPITAL_COMMUNITY): Payer: Medicare Other

## 2018-01-25 ENCOUNTER — Encounter (HOSPITAL_COMMUNITY): Payer: Self-pay | Admitting: Internal Medicine

## 2018-01-25 ENCOUNTER — Other Ambulatory Visit: Payer: Self-pay

## 2018-01-25 DIAGNOSIS — F419 Anxiety disorder, unspecified: Secondary | ICD-10-CM | POA: Diagnosis present

## 2018-01-25 DIAGNOSIS — E785 Hyperlipidemia, unspecified: Secondary | ICD-10-CM | POA: Diagnosis present

## 2018-01-25 DIAGNOSIS — Z9109 Other allergy status, other than to drugs and biological substances: Secondary | ICD-10-CM

## 2018-01-25 DIAGNOSIS — I452 Bifascicular block: Secondary | ICD-10-CM | POA: Diagnosis present

## 2018-01-25 DIAGNOSIS — Z880 Allergy status to penicillin: Secondary | ICD-10-CM

## 2018-01-25 DIAGNOSIS — Z79899 Other long term (current) drug therapy: Secondary | ICD-10-CM

## 2018-01-25 DIAGNOSIS — Z7901 Long term (current) use of anticoagulants: Secondary | ICD-10-CM

## 2018-01-25 DIAGNOSIS — Z951 Presence of aortocoronary bypass graft: Secondary | ICD-10-CM

## 2018-01-25 DIAGNOSIS — I130 Hypertensive heart and chronic kidney disease with heart failure and stage 1 through stage 4 chronic kidney disease, or unspecified chronic kidney disease: Principal | ICD-10-CM | POA: Diagnosis present

## 2018-01-25 DIAGNOSIS — Z66 Do not resuscitate: Secondary | ICD-10-CM | POA: Diagnosis present

## 2018-01-25 DIAGNOSIS — G4733 Obstructive sleep apnea (adult) (pediatric): Secondary | ICD-10-CM | POA: Diagnosis present

## 2018-01-25 DIAGNOSIS — J9622 Acute and chronic respiratory failure with hypercapnia: Secondary | ICD-10-CM | POA: Diagnosis not present

## 2018-01-25 DIAGNOSIS — Z885 Allergy status to narcotic agent status: Secondary | ICD-10-CM

## 2018-01-25 DIAGNOSIS — I2510 Atherosclerotic heart disease of native coronary artery without angina pectoris: Secondary | ICD-10-CM | POA: Diagnosis present

## 2018-01-25 DIAGNOSIS — J9621 Acute and chronic respiratory failure with hypoxia: Secondary | ICD-10-CM | POA: Diagnosis present

## 2018-01-25 DIAGNOSIS — J441 Chronic obstructive pulmonary disease with (acute) exacerbation: Secondary | ICD-10-CM | POA: Diagnosis present

## 2018-01-25 DIAGNOSIS — I509 Heart failure, unspecified: Secondary | ICD-10-CM

## 2018-01-25 DIAGNOSIS — E8770 Fluid overload, unspecified: Secondary | ICD-10-CM | POA: Diagnosis present

## 2018-01-25 DIAGNOSIS — Z888 Allergy status to other drugs, medicaments and biological substances status: Secondary | ICD-10-CM

## 2018-01-25 DIAGNOSIS — I5033 Acute on chronic diastolic (congestive) heart failure: Secondary | ICD-10-CM | POA: Diagnosis present

## 2018-01-25 DIAGNOSIS — R911 Solitary pulmonary nodule: Secondary | ICD-10-CM | POA: Diagnosis present

## 2018-01-25 DIAGNOSIS — J9612 Chronic respiratory failure with hypercapnia: Secondary | ICD-10-CM

## 2018-01-25 DIAGNOSIS — Z825 Family history of asthma and other chronic lower respiratory diseases: Secondary | ICD-10-CM

## 2018-01-25 DIAGNOSIS — R0602 Shortness of breath: Secondary | ICD-10-CM | POA: Diagnosis not present

## 2018-01-25 DIAGNOSIS — D649 Anemia, unspecified: Secondary | ICD-10-CM | POA: Diagnosis present

## 2018-01-25 DIAGNOSIS — I2721 Secondary pulmonary arterial hypertension: Secondary | ICD-10-CM | POA: Diagnosis present

## 2018-01-25 DIAGNOSIS — N183 Chronic kidney disease, stage 3 (moderate): Secondary | ICD-10-CM | POA: Diagnosis present

## 2018-01-25 DIAGNOSIS — D509 Iron deficiency anemia, unspecified: Secondary | ICD-10-CM | POA: Diagnosis present

## 2018-01-25 DIAGNOSIS — Z881 Allergy status to other antibiotic agents status: Secondary | ICD-10-CM

## 2018-01-25 DIAGNOSIS — Z87891 Personal history of nicotine dependence: Secondary | ICD-10-CM

## 2018-01-25 DIAGNOSIS — F329 Major depressive disorder, single episode, unspecified: Secondary | ICD-10-CM | POA: Diagnosis present

## 2018-01-25 DIAGNOSIS — I10 Essential (primary) hypertension: Secondary | ICD-10-CM | POA: Diagnosis present

## 2018-01-25 DIAGNOSIS — K219 Gastro-esophageal reflux disease without esophagitis: Secondary | ICD-10-CM | POA: Diagnosis present

## 2018-01-25 DIAGNOSIS — I272 Pulmonary hypertension, unspecified: Secondary | ICD-10-CM | POA: Diagnosis present

## 2018-01-25 DIAGNOSIS — Z9981 Dependence on supplemental oxygen: Secondary | ICD-10-CM

## 2018-01-25 DIAGNOSIS — I482 Chronic atrial fibrillation: Secondary | ICD-10-CM | POA: Diagnosis present

## 2018-01-25 DIAGNOSIS — Z8711 Personal history of peptic ulcer disease: Secondary | ICD-10-CM

## 2018-01-25 DIAGNOSIS — N179 Acute kidney failure, unspecified: Secondary | ICD-10-CM | POA: Diagnosis present

## 2018-01-25 DIAGNOSIS — M1990 Unspecified osteoarthritis, unspecified site: Secondary | ICD-10-CM | POA: Diagnosis present

## 2018-01-25 DIAGNOSIS — G4700 Insomnia, unspecified: Secondary | ICD-10-CM | POA: Diagnosis present

## 2018-01-25 DIAGNOSIS — Z91040 Latex allergy status: Secondary | ICD-10-CM

## 2018-01-25 HISTORY — DX: Dyspnea, unspecified: R06.00

## 2018-01-25 HISTORY — DX: Unspecified urinary incontinence: R32

## 2018-01-25 LAB — COMPREHENSIVE METABOLIC PANEL
ALT: 17 U/L (ref 0–44)
AST: 34 U/L (ref 15–41)
Albumin: 2.9 g/dL — ABNORMAL LOW (ref 3.5–5.0)
Alkaline Phosphatase: 58 U/L (ref 38–126)
Anion gap: 7 (ref 5–15)
BILIRUBIN TOTAL: 0.9 mg/dL (ref 0.3–1.2)
BUN: 22 mg/dL (ref 8–23)
CHLORIDE: 99 mmol/L (ref 98–111)
CO2: 36 mmol/L — ABNORMAL HIGH (ref 22–32)
Calcium: 9 mg/dL (ref 8.9–10.3)
Creatinine, Ser: 1.56 mg/dL — ABNORMAL HIGH (ref 0.44–1.00)
GFR calc Af Amer: 36 mL/min — ABNORMAL LOW (ref >60)
GFR, EST NON AFRICAN AMERICAN: 31 mL/min — AB (ref >60)
Glucose, Bld: 108 mg/dL — ABNORMAL HIGH (ref 70–99)
Potassium: 4.5 mmol/L (ref 3.5–5.1)
Sodium: 142 mmol/L (ref 135–145)
Total Protein: 5.4 g/dL — ABNORMAL LOW (ref 6.5–8.1)

## 2018-01-25 LAB — BRAIN NATRIURETIC PEPTIDE: B Natriuretic Peptide: 507.3 pg/mL — ABNORMAL HIGH (ref 0.0–100.0)

## 2018-01-25 LAB — CBC WITH DIFFERENTIAL/PLATELET
Abs Immature Granulocytes: 0.1 10*3/uL (ref 0.0–0.1)
Basophils Absolute: 0.1 10*3/uL (ref 0.0–0.1)
Basophils Relative: 1 %
EOS PCT: 0 %
Eosinophils Absolute: 0 10*3/uL (ref 0.0–0.7)
HEMATOCRIT: 29.2 % — AB (ref 36.0–46.0)
Hemoglobin: 8.2 g/dL — ABNORMAL LOW (ref 12.0–15.0)
Immature Granulocytes: 1 %
LYMPHS ABS: 1.5 10*3/uL (ref 0.7–4.0)
Lymphocytes Relative: 16 %
MCH: 27.6 pg (ref 26.0–34.0)
MCHC: 28.1 g/dL — AB (ref 30.0–36.0)
MCV: 98.3 fL (ref 78.0–100.0)
MONOS PCT: 13 %
Monocytes Absolute: 1.2 10*3/uL — ABNORMAL HIGH (ref 0.1–1.0)
Neutro Abs: 6.3 10*3/uL (ref 1.7–7.7)
Neutrophils Relative %: 69 %
Platelets: 322 10*3/uL (ref 150–400)
RBC: 2.97 MIL/uL — AB (ref 3.87–5.11)
RDW: 16.9 % — AB (ref 11.5–15.5)
WBC: 9.1 10*3/uL (ref 4.0–10.5)

## 2018-01-25 LAB — POC OCCULT BLOOD, ED: FECAL OCCULT BLD: NEGATIVE

## 2018-01-25 LAB — PREPARE RBC (CROSSMATCH)

## 2018-01-25 LAB — I-STAT TROPONIN, ED: Troponin i, poc: 0.01 ng/mL (ref 0.00–0.08)

## 2018-01-25 MED ORDER — CLONAZEPAM 0.5 MG PO TABS
0.5000 mg | ORAL_TABLET | Freq: Every day | ORAL | Status: DC
  Administered 2018-01-25 – 2018-01-26 (×2): 0.5 mg via ORAL
  Filled 2018-01-25 (×2): qty 1

## 2018-01-25 MED ORDER — ISOSORBIDE MONONITRATE ER 30 MG PO TB24
60.0000 mg | ORAL_TABLET | Freq: Every day | ORAL | Status: DC
  Administered 2018-01-25 – 2018-01-31 (×6): 60 mg via ORAL
  Filled 2018-01-25 (×6): qty 2

## 2018-01-25 MED ORDER — FUROSEMIDE 10 MG/ML IJ SOLN
20.0000 mg | Freq: Once | INTRAMUSCULAR | Status: AC
  Administered 2018-01-25: 20 mg via INTRAVENOUS
  Filled 2018-01-25: qty 2

## 2018-01-25 MED ORDER — SODIUM CHLORIDE 0.9% IV SOLUTION: Freq: Once | INTRAVENOUS | Status: DC

## 2018-01-25 MED ORDER — PANTOPRAZOLE SODIUM 40 MG PO TBEC
40.0000 mg | DELAYED_RELEASE_TABLET | Freq: Every day | ORAL | Status: DC
  Administered 2018-01-26 – 2018-02-01 (×7): 40 mg via ORAL
  Filled 2018-01-25 (×7): qty 1

## 2018-01-25 MED ORDER — FUROSEMIDE 10 MG/ML IJ SOLN
80.0000 mg | Freq: Once | INTRAMUSCULAR | Status: AC
  Administered 2018-01-25: 80 mg via INTRAVENOUS
  Filled 2018-01-25: qty 8

## 2018-01-25 MED ORDER — METOPROLOL TARTRATE 25 MG PO TABS
50.0000 mg | ORAL_TABLET | Freq: Two times a day (BID) | ORAL | Status: DC
  Administered 2018-01-26 – 2018-02-01 (×13): 50 mg via ORAL
  Filled 2018-01-25 (×15): qty 2

## 2018-01-25 MED ORDER — RIVAROXABAN 15 MG PO TABS
15.0000 mg | ORAL_TABLET | Freq: Every day | ORAL | Status: DC
  Administered 2018-01-25 – 2018-01-31 (×6): 15 mg via ORAL
  Filled 2018-01-25 (×6): qty 1

## 2018-01-25 MED ORDER — ONDANSETRON HCL 4 MG/2ML IJ SOLN: 4.0000 mg | Freq: Four times a day (QID) | INTRAMUSCULAR | Status: DC | PRN

## 2018-01-25 MED ORDER — AMIODARONE HCL 200 MG PO TABS
200.0000 mg | ORAL_TABLET | Freq: Every day | ORAL | Status: DC
  Administered 2018-01-26 – 2018-02-01 (×7): 200 mg via ORAL
  Filled 2018-01-25 (×7): qty 1

## 2018-01-25 MED ORDER — LORATADINE 10 MG PO TABS
10.0000 mg | ORAL_TABLET | Freq: Every day | ORAL | Status: DC
  Administered 2018-01-26 – 2018-02-01 (×7): 10 mg via ORAL
  Filled 2018-01-25 (×7): qty 1

## 2018-01-25 MED ORDER — SIMVASTATIN 20 MG PO TABS
60.0000 mg | ORAL_TABLET | Freq: Every day | ORAL | Status: DC
  Administered 2018-01-25 – 2018-01-31 (×6): 60 mg via ORAL
  Filled 2018-01-25 (×7): qty 1

## 2018-01-25 MED ORDER — FUROSEMIDE 10 MG/ML IJ SOLN
40.0000 mg | Freq: Two times a day (BID) | INTRAMUSCULAR | Status: DC
  Administered 2018-01-25 – 2018-01-26 (×2): 40 mg via INTRAVENOUS
  Filled 2018-01-25 (×2): qty 4

## 2018-01-25 MED ORDER — ACETAMINOPHEN 325 MG PO TABS
650.0000 mg | ORAL_TABLET | ORAL | Status: DC | PRN
  Administered 2018-01-26 – 2018-01-31 (×4): 650 mg via ORAL
  Filled 2018-01-25 (×5): qty 2

## 2018-01-25 MED ORDER — SODIUM CHLORIDE 0.9% FLUSH
3.0000 mL | Freq: Two times a day (BID) | INTRAVENOUS | Status: DC
  Administered 2018-01-25 – 2018-02-01 (×14): 3 mL via INTRAVENOUS

## 2018-01-25 MED ORDER — IPRATROPIUM BROMIDE 0.02 % IN SOLN
0.5000 mg | Freq: Four times a day (QID) | RESPIRATORY_TRACT | Status: DC
  Administered 2018-01-25 – 2018-01-28 (×11): 0.5 mg via RESPIRATORY_TRACT
  Filled 2018-01-25 (×11): qty 2.5

## 2018-01-25 MED ORDER — SODIUM CHLORIDE 0.9% FLUSH: 3.0000 mL | INTRAVENOUS | Status: DC | PRN

## 2018-01-25 MED ORDER — SODIUM CHLORIDE 0.9% IV SOLUTION
Freq: Once | INTRAVENOUS | Status: AC
Start: 2018-01-25 — End: 2018-01-25
  Administered 2018-01-25: 11:00:00 via INTRAVENOUS

## 2018-01-25 MED ORDER — SODIUM CHLORIDE 0.9 % IV SOLN
250.0000 mL | INTRAVENOUS | Status: DC | PRN
  Administered 2018-01-27: 250 mL via INTRAVENOUS

## 2018-01-25 NOTE — ED Provider Notes (Signed)
[facility] EMERGENCY DEPARTMENT Provider Note   CSN: [id number] Arrival date & time: 01/25/18  0736     History   Chief Complaint Chief Complaint  Patient presents with  . Shortness of Breath    HPI [name] is a 77 y.o. female.  77 y.o. female with past medical hx of anxiety/depression, COPD on 2 L O2 via [place] at rest/3 L O2 with exertion at baseline, OSA, Afib, chronic diastolic CHF, HTN, MV stenosis, CAD S/P CABG, rheumatic AV disease, pulmonary HTN, hiatal hernia, and HLD who presents with shortness of breath.  She states that she feels this is related to her blood counts being low.  She is scheduled to have a blood transfusion on Monday.  She generally feels weak and tired and has increased shortness of breath.  She has no productive cough.  No known fevers.  No associated chest pain.  She does have some chronic swelling of her lower extremities that has been worse over the last month or so.  She had a recent hospitalization on May 22 for COPD/CHF exacerbation.  She also had a UTI at that time.  She was found to be hypoxic today by EMS with oxygen saturations in the 60s and was briefly placed on nonrebreather but is currently tolerating a nasal cannula without difficulty.  She was given a nebulizer treatment by EMS and feels a little bit better after that.     Past Medical History:  Diagnosis Date  . Acute parotitis 01/05/2016  . Anxiety   . Bacteremia, escherichia coli   . Bursitis, shoulder    bilateral  . CHF (congestive heart failure) (HCC)    chronic diasotlic CHF  . Complication of anesthesia    "have trouble getting me awake sometimes; been ok latelhy" (09/24/2015)  . COPD (chronic obstructive pulmonary disease) (HCC)   . Coronary heart disease 2001   s/p CABG  . Depression   . DJD (degenerative joint disease)   . GERD (gastroesophageal reflux disease)   . H/O hiatal hernia   . History of rheumatic heart disease    X 3-LAST TIME WHEN PATIENT  WAS 77 YRS OLD  . Hyperlipidemia   . Hypertension   . OA (osteoarthritis)   . On home oxygen therapy    "2L; 24/7" (09/24/2015)  . PUD (peptic ulcer disease)   . Pulmonary HTN (HCC)    PASP 50-73mmHg by [id number]/[id number]  . Sleep apnea    intolerant to CPAP  . Thyroid nodule   . Urinary incontinence   . Varicose veins     Patient Active Problem List   Diagnosis Date Noted  . Respiratory distress 12/13/2017  . Metabolic alkalosis 12/13/2017  . Gait disturbance 11/21/2017  . Lung nodule 11/07/2016  . Mitral valve stenosis   . Rheumatic aortic valve disease   . Atrial fibrillation (HCC) 06/12/2016  . Anemia   . Chronic respiratory failure with hypoxia (HCC) 04/20/2016  . Rheumatic mitral valve disease   . PFO (patent foramen ovale)   . Cardiomyopathy (HCC)   . Acute on chronic diastolic heart failure (HCC)   . Chronic atrial fibrillation (HCC)   . Alcoholic cardiomyopathy (HCC)   . Aortic valve vegetation 09/24/2015  . Lumbar spine scoliosis 04/18/2015  . Mitral stenosis 02/10/2015  . Chronic diastolic heart failure (HCC) 05/10/2013  . Atrial flutter (HCC) 05/10/2013  . Chronic anticoagulation 05/10/2013  . HTN (hypertension) 05/10/2013  . Dyslipidemia, goal LDL below 70 05/10/2013  . Seasonal  allergic rhinitis 04/11/2012  . DYSPNEA 03/14/2008  . Coronary atherosclerosis 02/27/2008  . COPD exacerbation (HCC) 02/27/2008  . Pulmonary hypertension (HCC) 02/21/2008  . DIASTOLIC DYSFUNCTION 02/21/2008    Past Surgical History:  Procedure Laterality Date  . BILATERAL SALPINGOOPHORECTOMY    . CARDIAC CATHETERIZATION  02/11/2008, 01/30/2004, 06/14/2000  . CARDIAC CATHETERIZATION N/A 09/24/2015   Procedure: Right Heart Cath;  Surgeon: Laurey Morale, MD;  Location: [facility] INVASIVE CV LAB;  Service: Cardiovascular;  Laterality: N/A;  . CARDIAC CATHETERIZATION N/A 02/08/2016   Procedure: Right/Left Heart Cath and Coronary/Graft Angiography;  Surgeon: Laurey Morale, MD;  Location: [facility]  INVASIVE CV LAB;  Service: Cardiovascular;  Laterality: N/A;  . CARDIOVERSION N/A 06/12/2016   Procedure: CARDIOVERSION;  Surgeon: Duke Salvia, MD;  Location: [facility] OR;  Service: Cardiovascular;  Laterality: N/A;  . CORONARY ARTERY BYPASS GRAFT  06/21/2000   x4  . INCONTINENCE SURGERY     "tacked"  . TEE WITHOUT CARDIOVERSION N/A 09/24/2015   Procedure: TRANSESOPHAGEAL ECHOCARDIOGRAM (TEE);  Surgeon: Laurey Morale, MD;  Location: [facility] ENDOSCOPY;  Service: Cardiovascular;  Laterality: N/A;  . THYROIDECTOMY, PARTIAL  2010   Dr Michaell Cowing  . TOTAL ABDOMINAL HYSTERECTOMY  1990   1990  . TUBAL LIGATION    . VAGINAL DELIVERY  x4     OB History    Gravida  4   Para  4   Term      Preterm      AB      Living        SAB      TAB      Ectopic      Multiple      Live Births               Home Medications    Prior to Admission medications   Medication Sig Start Date End Date Taking? Authorizing Provider  acetaminophen (TYLENOL) 500 MG tablet Take 1,000 mg by mouth 2 (two) times daily as needed for headache.    Yes [provider]  amiodarone (PACERONE) 200 MG tablet Take 1 tablet (200 mg total) by mouth daily. 12/11/17  Yes Laurey Morale, MD  amLODipine (NORVASC) 2.5 MG tablet Take 1 tablet (2.5 mg total) by mouth daily. 01/23/18  Yes Laurey Morale, MD  benzonatate (TESSALON) 200 MG capsule Take 1 capsule (200 mg total) by mouth 3 (three) times daily as needed for cough. 08/26/16  Yes Clegg, Amy D, NP  calcium-vitamin D (OSCAL WITH D) 500-200 MG-UNIT per tablet Take 2 tablets by mouth 2 (two) times daily.    Yes [provider]  clonazePAM (KLONOPIN) 0.5 MG tablet Take 0.5 mg by mouth at bedtime.  01/05/18  Yes [provider]  diclofenac sodium (VOLTAREN) 1 % GEL Apply 2 g topically 4 (four) times daily. Patient taking differently: Apply 2 g topically daily as needed (pain).  10/20/17  Yes Melene Plan, DO  docusate sodium (COLACE) 100 MG capsule Take  200 mg by mouth as needed for mild constipation.   Yes [provider]  ipratropium (ATROVENT) 0.02 % nebulizer solution Take 2.5 mLs (0.5 mg total) by nebulization 4 (four) times daily. Dx: J44.9 01/15/18  Yes Parrett, Tammy S, NP  IRON PO Take 27 mg by mouth daily.    Yes [provider]  isosorbide mononitrate (IMDUR) 60 MG 24 hr tablet Take 1 tablet (60 mg total) daily by mouth. Patient taking differently: Take 60 mg by mouth  daily at 6 PM.  05/30/17  Yes Laurey Morale, MD  loratadine (CLARITIN) 10 MG tablet Take 10 mg by mouth daily.    Yes [provider]  metoprolol tartrate (LOPRESSOR) 50 MG tablet Take 1 tablet (50 mg total) by mouth 2 (two) times daily. 09/01/17  Yes Laurey Morale, MD  NITROSTAT 0.4 MG SL tablet place 1 tablet under the tongue if needed every 5 minutes for chest pain for 3 doses IF NO RELIEF AFTER 3RD DOSE CALL PRESCRIBER OR 911. 01/21/16  Yes Turner, Cornelious Bryant, MD  omeprazole (PRILOSEC) 40 MG capsule Take 40 mg by mouth daily. 11/22/17  Yes [provider]  OXYGEN Inhale 2-3 L into the lungs continuous. At rest 2 liters  Moving around 3 liters   Yes [provider]  potassium chloride SA (K-DUR,KLOR-CON) 20 MEQ tablet TAKE 2 TABLETS BY MOUTH TWO TIMES DAILY 11/28/16  Yes Bensimhon, Bevelyn Buckles, MD  Rivaroxaban (XARELTO) 15 MG TABS tablet Take 1 tablet (15 mg total) by mouth daily with supper. 10/26/17  Yes Laurey Morale, MD  simvastatin (ZOCOR) 20 MG tablet TAKE 3 TABLETS BY MOUTH  DAILY AT 6 PM 05/30/17  Yes Laurey Morale, MD  spironolactone (ALDACTONE) 25 MG tablet TAKE 1 TABLET BY MOUTH  DAILY 05/30/17  Yes Laurey Morale, MD  torsemide (DEMADEX) 20 MG tablet Take 4 tablets (80 mg total) by mouth 2 (two) times daily. 01/12/18  Yes Laurey Morale, MD    Family History Family History  Problem Relation Age of Onset  . Anemia Mother   . Emphysema Father   . Esophageal cancer Daughter     Social History Social History    Tobacco Use  . Smoking status: Former Smoker    Packs/day: 1.00    Years: 47.00    Pack years: 47.00    Types: Cigarettes    Last attempt to quit: 07/26/2007    Years since quitting: 10.5  . Smokeless tobacco: Never Used  Substance Use Topics  . Alcohol use: No  . Drug use: No     Allergies   Lisinopril; Tape; Xopenex [levalbuterol]; Cefpodoxime; Codeine; Lipitor [atorvastatin]; Other; Penicillins; Ventolin [kdc:albuterol]; Anoro ellipta [umeclidinium-vilanterol]; Clindamycin; Hydrocodone-acetaminophen; Latex; and Levalbuterol hcl   Review of Systems Review of Systems  Constitutional: Positive for fatigue. Negative for chills, diaphoresis and fever.  HENT: Negative for congestion, rhinorrhea and sneezing.   Eyes: Negative.   Respiratory: Positive for cough and shortness of breath. Negative for chest tightness.   Cardiovascular: Negative for chest pain and leg swelling.  Gastrointestinal: Negative for abdominal pain, blood in stool, diarrhea, nausea and vomiting.  Genitourinary: Negative for difficulty urinating, flank pain, frequency and hematuria.  Musculoskeletal: Negative for arthralgias and back pain.  Skin: Negative for rash.  Neurological: Positive for weakness (Generalized). Negative for dizziness, speech difficulty, numbness and headaches.     Physical Exam Updated Vital Signs BP 114/64   Pulse (!) 53   Temp 97.6 F (36.4 C) (Oral)   Resp (!) 22   Ht 5\' 6"  (1.676 m)   Wt 86.2 kg (190 lb)   SpO2 100%   BMI 30.67 kg/m   Physical Exam  Constitutional: She is oriented to person, place, and time. She appears well-developed and well-nourished.  HENT:  Head: Normocephalic and atraumatic.  Eyes: Pupils are equal, round, and reactive to light.  Neck: Normal range of motion. Neck supple.  Cardiovascular: Normal rate, regular rhythm and normal heart sounds.  Pulmonary/Chest:  Accessory muscle usage present. Tachypnea noted. No respiratory distress. She has wheezes.  She has no rales. She exhibits no tenderness.  Abdominal: Soft. Bowel sounds are normal. There is no tenderness. There is no rebound and no guarding.  Musculoskeletal: Normal range of motion.       Right lower leg: She exhibits edema.       Left lower leg: She exhibits edema.  3+ pitting edema bilaterally  Lymphadenopathy:    She has no cervical adenopathy.  Neurological: She is alert and oriented to person, place, and time.  Skin: Skin is warm and dry. No rash noted.  Psychiatric: She has a normal mood and affect.     ED Treatments / Results  Labs (all labs ordered are listed, but only abnormal results are displayed) Labs Reviewed  COMPREHENSIVE METABOLIC PANEL - Abnormal; Notable for the following components:      Result Value   CO2 36 (*)    Glucose, Bld 108 (*)    Creatinine, Ser 1.56 (*)    Total Protein 5.4 (*)    Albumin 2.9 (*)    GFR calc non Af Amer 31 (*)    GFR calc Af Amer 36 (*)    All other components within normal limits  CBC WITH DIFFERENTIAL/PLATELET - Abnormal; Notable for the following components:   RBC 2.97 (*)    Hemoglobin 8.2 (*)    HCT 29.2 (*)    MCHC 28.1 (*)    RDW 16.9 (*)    Monocytes Absolute 1.2 (*)    All other components within normal limits  BRAIN NATRIURETIC PEPTIDE - Abnormal; Notable for the following components:   B Natriuretic Peptide 507.3 (*)    All other components within normal limits  I-STAT TROPONIN, ED  POC OCCULT BLOOD, ED  TYPE AND SCREEN  PREPARE RBC (CROSSMATCH)    EKG EKG Interpretation  Date/Time:  Thursday January 25 2018 07:47:31 EDT Ventricular Rate:  54 PR Interval:    QRS Duration: 172 QT Interval:  533 QTC Calculation: 506 R Axis:   -74 Text Interpretation:  Sinus or ectopic atrial rhythm Short PR interval RBBB and LAFB Probable left ventricular hypertrophy Artifact in lead(s) I II III aVR aVL aVF V2 since last tracing no significant change Confirmed by Rolan Bucco [phone]) on 01/25/2018 8:04:33 AM Also  confirmed by Rolan Bucco [phone]), editor Madalyn Rob [phone])  on 01/25/2018 9:40:14 AM   Radiology Dg Chest 2 View  Result Date: 01/25/2018 CLINICAL DATA:  77 year old female with a history of increased shortness of breath EXAM: CHEST - 2 VIEW COMPARISON:  01/12/2018 FINDINGS: Cardiomediastinal silhouette is unchanged in size and contour. Surgical changes of median sternotomy and CABG. Low lung volumes with persisting mixed interstitial and airspace opacity at the bilateral lung bases. Blunting of the costophrenic sulcus on the lateral view. No pneumothorax. Degenerative changes of the spine. IMPRESSION: Low lung volumes with mixed interstitial and airspace opacity at the lung bases, may reflect a combination of edema, atelectasis/consolidation, and scarring. Correlation with lab values may be useful. Trace left-sided pleural effusion not excluded. Surgical changes of median sternotomy and CABG. Electronically Signed   By: Gilmer Mor D.O.   On: 01/25/2018 08:43    Procedures Procedures (including critical care time)  Medications Ordered in ED Medications  0.9 %  sodium chloride infusion (Manually program via Guardrails IV Fluids) (has no administration in time range)  furosemide (LASIX) injection 80 mg (80 mg Intravenous Given 01/25/18 0937)  Initial Impression / Assessment and Plan / ED Course  I have reviewed the triage vital signs and the nursing notes.  Pertinent labs & imaging results that were available during my care of the patient were reviewed by me and considered in my medical decision making (see chart for details).     Patient is a 77 year old female who presents with shortness of breath and weakness.  Her lungs show some mild wheezing.  She improved slightly with nebulizer treatment by EMS but is still remain tachypneic.  She is maintaining normal oxygen saturations on her baseline 2 to 3 L/min of oxygen.  Chest x-ray shows some possible pulmonary edema.  She does not  have a fever or elevated white count speaks more suggestive of pneumonia.  She was given Lasix in the ED.  She also was found to be anemic with a hemoglobin of 8.2.  This is been a continuing drop over the last month where it was around 10 at the end of May.  Her Hemoccult is negative.  She recently saw Dr. Randa Evens with gastroenterology who due to her underlying health problems did not want to do endoscopy/colonoscopy.  Given her ongoing symptoms, I feel that she would benefit from hospitalization with blood transfusion and mild diuresis.  I spoke with Dr. Ophelia Charter who will admit the patient.  Final Clinical Impressions(s) / ED Diagnoses   Final diagnoses:  SOB (shortness of breath)  Congestive heart failure, unspecified HF chronicity, unspecified heart failure type (HCC)  Symptomatic anemia    ED Discharge Orders    None       Rolan Bucco, MD 01/25/18 1029

## 2018-01-25 NOTE — H&P (Signed)
History and Physical    [name] ZOX:[id number] DOB: April 08, 1941 DOA: 01/25/2018  PCP: Monica Small, MD Consultants:  [name] - CHF Clinic; [name] - pulmonology; [name] - GI Patient coming from:  Home - lives with husband and son; [name]: [name], [phone]  Chief Complaint:  SOB  HPI: [name] is a 77 y.o. 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  "I just couldn't breathe.  I just been getting weaker and weaker.  I went to see Dr. Randa Lloyd Monday and he said my blood was low and I needed a transfusion."  She was scheduled for this coming Monday at short stay.  Since Monday, she has been getting weaker and weaker.  She was getting unable to go even from her room to the bathroom without getting out of breath.  No cough.  No wheezing.  Increased LE edema - she was hospitalized in May and they have been swollen since.  She gained 14 pounds during that hospitalization.  She lost a little of that weight after she got home - she is about 5 pounds over usual weight but her legs are still swollen.  No chest pain but she does have discomfort at the base of her neck.   ED Course:  SOB, generalized fatigue.  Anemic recently, was supposed to get transfusion Monday.  Maintaining sats on baseline O2.  Neb given by EMS.  CXR with mild CHF, increased BNP.  Likely worsening anemia (Hgb 8.2, baseline 10, heme negative) and CHF.  Ordered blood and Lasix.  Review of Systems: As per HPI; otherwise review of systems reviewed and negative.   Ambulatory Status:  Ambulates with a walker  Past Medical History:  Diagnosis Date  . Acute parotitis 01/05/2016  . Anxiety   . Bacteremia, escherichia coli   . Bursitis, shoulder    bilateral  . CHF (congestive heart failure) (HCC)    chronic diasotlic CHF  . Complication of anesthesia    "have trouble getting me awake sometimes; been ok latelhy"  (09/24/2015)  . COPD (chronic obstructive pulmonary disease) (HCC)   . Coronary heart disease 2001   s/p CABG  . Depression   . DJD (degenerative joint disease)   . GERD (gastroesophageal reflux disease)   . H/O hiatal hernia   . History of rheumatic heart disease    X 3-LAST TIME WHEN PATIENT WAS 77 YRS OLD  . Hyperlipidemia   . Hypertension   . OA (osteoarthritis)   . On home oxygen therapy    "2L; 24/7" (09/24/2015)  . PUD (peptic ulcer disease)   . Pulmonary HTN (HCC)    PASP 50-9mmHg by [id number]  . Sleep apnea    intolerant to CPAP  . Thyroid nodule   . Urinary incontinence   . Varicose veins     Past Surgical History:  Procedure Laterality Date  . BILATERAL SALPINGOOPHORECTOMY    . CARDIAC CATHETERIZATION  02/11/2008, 01/30/2004, 06/14/2000  . CARDIAC CATHETERIZATION N/A 09/24/2015   Procedure: Right Heart Cath;  Surgeon: Laurey Morale, MD;  Location: [facility] INVASIVE CV LAB;  Service: Cardiovascular;  Laterality: N/A;  . CARDIAC CATHETERIZATION N/A 02/08/2016   Procedure: Right/Left Heart Cath and Coronary/Graft Angiography;  Surgeon: Laurey Morale, MD;  Location: [facility] INVASIVE CV LAB;  Service: Cardiovascular;  Laterality: N/A;  . CARDIOVERSION N/A 06/12/2016   Procedure: CARDIOVERSION;  Surgeon: Salvatore Decent  Graciela Husbands, MD;  Location: [facility] OR;  Service: Cardiovascular;  Laterality: N/A;  . CORONARY ARTERY BYPASS GRAFT  06/21/2000   x4  . INCONTINENCE SURGERY     "tacked"  . TEE WITHOUT CARDIOVERSION N/A 09/24/2015   Procedure: TRANSESOPHAGEAL ECHOCARDIOGRAM (TEE);  Surgeon: Laurey Morale, MD;  Location: [facility] ENDOSCOPY;  Service: Cardiovascular;  Laterality: N/A;  . THYROIDECTOMY, PARTIAL  2010   Dr Michaell Cowing  . TOTAL ABDOMINAL HYSTERECTOMY  1990   1990  . TUBAL LIGATION    . VAGINAL DELIVERY  x4    Social History   Socioeconomic History  . Marital status: Married    Spouse name: Not on file  . Number of children: Not on file  . Years of education: Not on file  . Highest  education level: Not on file  Occupational History  . Occupation: retired  Engineer, production  . Financial resource strain: Not on file  . Food insecurity:    Worry: Not on file    Inability: Not on file  . Transportation needs:    Medical: Not on file    Non-medical: Not on file  Tobacco Use  . Smoking status: Former Smoker    Packs/day: 1.00    Years: 47.00    Pack years: 47.00    Types: Cigarettes    Last attempt to quit: 07/26/2007    Years since quitting: 10.5  . Smokeless tobacco: Never Used  Substance and Sexual Activity  . Alcohol use: No  . Drug use: No  . Sexual activity: Not Currently  Lifestyle  . Physical activity:    Days per week: Not on file    Minutes per session: Not on file  . Stress: Not on file  Relationships  . Social connections:    Talks on phone: Not on file    Gets together: Not on file    Attends religious service: Not on file    Active member of club or organization: Not on file    Attends meetings of clubs or organizations: Not on file    Relationship status: Not on file  . Intimate partner violence:    Fear of current or ex partner: Not on file    Emotionally abused: Not on file    Physically abused: Not on file    Forced sexual activity: Not on file  Other Topics Concern  . Not on file  Social History Narrative   Husband bilateral left amputee for atheorsclerotic disease    Allergies  Allergen Reactions  . Lisinopril Cough    Developed persistent dry cough for a month.   . Tape Other (See Comments)    Bleeding  . Xopenex [Levalbuterol] Other (See Comments)    Made mouth and throat sore  . Cefpodoxime Other (See Comments)    Yeast infections   . Codeine Other (See Comments)    Anxious/ jittery  . Lipitor [Atorvastatin] Other (See Comments)    Made joint start hurting/ Muscle aches  . Other Swelling    Venholin HFA  . Penicillins Other (See Comments)    Has patient had a PCN reaction causing immediate rash, facial/tongue/throat  swelling, SOB or lightheadedness with hypotension: unknown, childhood reaction Has patient had a PCN reaction causing severe rash involving mucus membranes or skin necrosis: No Has patient had a PCN reaction that required hospitalization No Has patient had a PCN reaction occurring within the last 10 years: No If all of the above answers are "NO", then may proceed with Cephalosporin  use.   . Ventolin [Kdc:Albuterol] Other (See Comments)    "jittery"  . Anoro Ellipta [Umeclidinium-Vilanterol] Itching  . Clindamycin Other (See Comments)    Unknown   . Hydrocodone-Acetaminophen Anxiety  . Latex Itching and Rash  . Levalbuterol Hcl Other (See Comments)    Unknown     Family History  Problem Relation Age of Onset  . Anemia Mother   . Emphysema Father   . Esophageal cancer Daughter     Prior to Admission medications   Medication Sig Start Date End Date Taking? Authorizing Provider  acetaminophen (TYLENOL) 500 MG tablet Take 1,000 mg by mouth 2 (two) times daily as needed for headache.    Yes [provider]  amiodarone (PACERONE) 200 MG tablet Take 1 tablet (200 mg total) by mouth daily. 12/11/17  Yes Laurey Morale, MD  amLODipine (NORVASC) 2.5 MG tablet Take 1 tablet (2.5 mg total) by mouth daily. 01/23/18  Yes Laurey Morale, MD  benzonatate (TESSALON) 200 MG capsule Take 1 capsule (200 mg total) by mouth 3 (three) times daily as needed for cough. 08/26/16  Yes Clegg, Amy D, NP  calcium-vitamin D (OSCAL WITH D) 500-200 MG-UNIT per tablet Take 2 tablets by mouth 2 (two) times daily.    Yes [provider]  clonazePAM (KLONOPIN) 0.5 MG tablet Take 0.5 mg by mouth at bedtime.  01/05/18  Yes [provider]  diclofenac sodium (VOLTAREN) 1 % GEL Apply 2 g topically 4 (four) times daily. Patient taking differently: Apply 2 g topically daily as needed (pain).  10/20/17  Yes Melene Plan, DO  docusate sodium (COLACE) 100 MG capsule Take 200 mg by mouth as needed for mild  constipation.   Yes [provider]  ipratropium (ATROVENT) 0.02 % nebulizer solution Take 2.5 mLs (0.5 mg total) by nebulization 4 (four) times daily. Dx: J44.9 01/15/18  Yes Parrett, Tammy S, NP  IRON PO Take 27 mg by mouth daily.    Yes [provider]  isosorbide mononitrate (IMDUR) 60 MG 24 hr tablet Take 1 tablet (60 mg total) daily by mouth. Patient taking differently: Take 60 mg by mouth daily at 6 PM.  05/30/17  Yes Laurey Morale, MD  loratadine (CLARITIN) 10 MG tablet Take 10 mg by mouth daily.    Yes [provider]  metoprolol tartrate (LOPRESSOR) 50 MG tablet Take 1 tablet (50 mg total) by mouth 2 (two) times daily. 09/01/17  Yes Laurey Morale, MD  NITROSTAT 0.4 MG SL tablet place 1 tablet under the tongue if needed every 5 minutes for chest pain for 3 doses IF NO RELIEF AFTER 3RD DOSE CALL PRESCRIBER OR 911. 01/21/16  Yes Turner, Cornelious Bryant, MD  omeprazole (PRILOSEC) 40 MG capsule Take 40 mg by mouth daily. 11/22/17  Yes [provider]  OXYGEN Inhale 2-3 L into the lungs continuous. At rest 2 liters  Moving around 3 liters   Yes [provider]  potassium chloride SA (K-DUR,KLOR-CON) 20 MEQ tablet TAKE 2 TABLETS BY MOUTH TWO TIMES DAILY 11/28/16  Yes Bensimhon, Bevelyn Buckles, MD  Rivaroxaban (XARELTO) 15 MG TABS tablet Take 1 tablet (15 mg total) by mouth daily with supper. 10/26/17  Yes Laurey Morale, MD  simvastatin (ZOCOR) 20 MG tablet TAKE 3 TABLETS BY MOUTH  DAILY AT 6 PM 05/30/17  Yes Laurey Morale, MD  spironolactone (ALDACTONE) 25 MG tablet TAKE 1 TABLET BY MOUTH  DAILY 05/30/17  Yes Laurey Morale, MD  torsemide (DEMADEX) 20 MG tablet Take 4 tablets (80 mg total) by mouth 2 (two) times daily. 01/12/18  Yes Laurey Morale, MD    Physical Exam: Vitals:   01/25/18 1145 01/25/18 1226 01/25/18 1235 01/25/18 1430  BP: 120/60  (!) 111/51 (!) 110/40  Pulse: (!) 54  (!) 55 (!) 58  Resp: (!) 25  20 20   Temp:   98 F (36.7 C) 98.2 F (36.8  C)  TempSrc:   Oral Oral  SpO2: 100%  97% 100%  Weight:  89.4 kg (197 lb 1.5 oz)    Height:  5\' 6"  (1.676 m)       General:  Appears calm and comfortable and is NAD but she appears chronically ill and very fatigued Eyes:  PERRL, EOMI, normal lids, iris ENT:  grossly normal hearing, lips & tongue, mmm Neck:  no LAD, masses or thyromegaly; no carotid bruits Cardiovascular:  RRR, no m/r/g. 2-3+ pitting LE edema.  Respiratory:   CTA bilaterally with bibasilar rhonchi.  Mildly increased respiratory effort. Abdomen:  soft, NT, ND, NABS Back:   normal alignment, no CVAT Skin:  no rash or induration seen on limited exam Musculoskeletal:  grossly normal tone BUE/BLE, good ROM, no bony abnormality Psychiatric:  grossly normal mood and affect, speech fluent and appropriate, AOx3 Neurologic:  CN 2-12 grossly intact, moves all extremities in coordinated fashion, sensation intact    Radiological Exams on Admission: Dg Chest 2 View  Result Date: 01/25/2018 CLINICAL DATA:  77 year old female with a history of increased shortness of breath EXAM: CHEST - 2 VIEW COMPARISON:  01/12/2018 FINDINGS: Cardiomediastinal silhouette is unchanged in size and contour. Surgical changes of median sternotomy and CABG. Low lung volumes with persisting mixed interstitial and airspace opacity at the bilateral lung bases. Blunting of the costophrenic sulcus on the lateral view. No pneumothorax. Degenerative changes of the spine. IMPRESSION: Low lung volumes with mixed interstitial and airspace opacity at the lung bases, may reflect a combination of edema, atelectasis/consolidation, and scarring. Correlation with lab values may be useful. Trace left-sided pleural effusion not excluded. Surgical changes of median sternotomy and CABG. Electronically Signed   By: Gilmer Mor D.O.   On: 01/25/2018 08:43    EKG: Independently reviewed.  NSR with rate 54; nonspecific ST changes with no evidence of acute ischemia;  NSCSLT   Labs on Admission: I have personally reviewed the available labs and imaging studies at the time of the admission.  Pertinent labs:   CO2 36 Glucose 108 BUN 22/Creatinine 1.56/GFR 36; 42/1.38/41 on 6/27 BNP 507.3; 254.3 on 6/27 Troponin 0.01 WBC 9.1 Hgb 8.2; 8.5 on 6/27; baseline appears to be 10 Heme negative  Assessment/Plan Principal Problem:   Acute on chronic respiratory failure with hypercapnia (HCC) Active Problems:   Pulmonary hypertension (HCC)   Chronic anticoagulation   HTN (hypertension)   Acute on chronic diastolic heart failure (HCC)   Symptomatic anemia   Acute on chronic respiratory failure -patient with underlying COPD and pulm HTN resulting in chronic hypercarbic respiratory failure -She has chronic CO2 elevation on BMP and this is no worse than usual today -Instead, her symptoms appear to be markedly worse and this is likely related to superimposed symptomatic anemia (see below)  Symptomatic anemia -Patient with apparent baseline Hgb about 10, steadily worsening over the last month -MCV is 80-100, indicative of normocytic anemia -This generally occurs in chronic disease and acute blood loss anemia -She was arranged to have an outpatient transfusion on Monday, but  her symptoms continued to progress -Will transfuse here in the hospital and follow; suspect that she will experience significant symptom relief   Subacute CHF -She reports marked weight gain and edema since her prior hospitalization; she has lost much of the fluid weight but remains somewhat volume overloaded -Her BNP is elevated and her CXR is equivocal -With the amount of LE edema present, will continue to try to diurese and mobilize the fluid -TED hose ordered -She was given 80 mg IV Lasix on presentation -Will give 20 mg IV Lasix between units -Hold home diuretics -Will give an additional 40 mg IV Lasix BID -Monitor overnight on telemetry -If the patient has diuresed well and  feels better tomorrow after transfusion, consider d/c to home at that time -Hold Norvasc, as this may be contributing to edema  Chronic afib, on AC -She is heme negative and so Xarelto will be continued -Rate control with Amiodarone and Lopressor  HTN -Hold Norvasc, as above -Continue Lopressor -She is borderline low so does not appear to need additional medication at this time   DVT prophylaxis: Xarelto Code Status:  DNR - confirmed with patient Family Communication: None present Disposition Plan:  Home once clinically improved Consults called: None  Admission status: It is my clinical opinion that referral for OBSERVATION is reasonable and necessary in this patient based on the above information provided. The aforementioned taken together are felt to place the patient at high risk for further clinical deterioration. However it is anticipated that the patient may be medically stable for discharge from the hospital within 24 to 48 hours.    Jonah Blue MD [facility]  If note is complete, please contact covering daytime or nighttime physician. [email or web address] Password [email or web address]  01/25/2018, 2:52 PM

## 2018-01-25 NOTE — ED Triage Notes (Signed)
Patient c/o Sob x 1 week. Per patient blood transfusion is scheduled for next week due to low iron.

## 2018-01-26 ENCOUNTER — Other Ambulatory Visit (HOSPITAL_COMMUNITY): Payer: Self-pay

## 2018-01-26 DIAGNOSIS — I5033 Acute on chronic diastolic (congestive) heart failure: Secondary | ICD-10-CM | POA: Diagnosis present

## 2018-01-26 DIAGNOSIS — D509 Iron deficiency anemia, unspecified: Secondary | ICD-10-CM | POA: Diagnosis present

## 2018-01-26 DIAGNOSIS — Z9981 Dependence on supplemental oxygen: Secondary | ICD-10-CM | POA: Diagnosis not present

## 2018-01-26 DIAGNOSIS — M1990 Unspecified osteoarthritis, unspecified site: Secondary | ICD-10-CM | POA: Diagnosis present

## 2018-01-26 DIAGNOSIS — I361 Nonrheumatic tricuspid (valve) insufficiency: Secondary | ICD-10-CM | POA: Diagnosis not present

## 2018-01-26 DIAGNOSIS — J441 Chronic obstructive pulmonary disease with (acute) exacerbation: Secondary | ICD-10-CM | POA: Diagnosis present

## 2018-01-26 DIAGNOSIS — R0602 Shortness of breath: Secondary | ICD-10-CM | POA: Diagnosis present

## 2018-01-26 DIAGNOSIS — I2510 Atherosclerotic heart disease of native coronary artery without angina pectoris: Secondary | ICD-10-CM | POA: Diagnosis present

## 2018-01-26 DIAGNOSIS — N179 Acute kidney failure, unspecified: Secondary | ICD-10-CM | POA: Diagnosis present

## 2018-01-26 DIAGNOSIS — Z7901 Long term (current) use of anticoagulants: Secondary | ICD-10-CM | POA: Diagnosis not present

## 2018-01-26 DIAGNOSIS — R911 Solitary pulmonary nodule: Secondary | ICD-10-CM | POA: Diagnosis present

## 2018-01-26 DIAGNOSIS — G4700 Insomnia, unspecified: Secondary | ICD-10-CM | POA: Diagnosis present

## 2018-01-26 DIAGNOSIS — G4733 Obstructive sleep apnea (adult) (pediatric): Secondary | ICD-10-CM | POA: Diagnosis present

## 2018-01-26 DIAGNOSIS — E785 Hyperlipidemia, unspecified: Secondary | ICD-10-CM | POA: Diagnosis present

## 2018-01-26 DIAGNOSIS — J9622 Acute and chronic respiratory failure with hypercapnia: Secondary | ICD-10-CM | POA: Diagnosis present

## 2018-01-26 DIAGNOSIS — Z951 Presence of aortocoronary bypass graft: Secondary | ICD-10-CM | POA: Diagnosis not present

## 2018-01-26 DIAGNOSIS — N183 Chronic kidney disease, stage 3 (moderate): Secondary | ICD-10-CM | POA: Diagnosis present

## 2018-01-26 DIAGNOSIS — I452 Bifascicular block: Secondary | ICD-10-CM | POA: Diagnosis present

## 2018-01-26 DIAGNOSIS — I509 Heart failure, unspecified: Secondary | ICD-10-CM

## 2018-01-26 DIAGNOSIS — I482 Chronic atrial fibrillation: Secondary | ICD-10-CM | POA: Diagnosis present

## 2018-01-26 DIAGNOSIS — Z66 Do not resuscitate: Secondary | ICD-10-CM | POA: Diagnosis present

## 2018-01-26 DIAGNOSIS — F329 Major depressive disorder, single episode, unspecified: Secondary | ICD-10-CM | POA: Diagnosis present

## 2018-01-26 DIAGNOSIS — I10 Essential (primary) hypertension: Secondary | ICD-10-CM | POA: Diagnosis not present

## 2018-01-26 DIAGNOSIS — I130 Hypertensive heart and chronic kidney disease with heart failure and stage 1 through stage 4 chronic kidney disease, or unspecified chronic kidney disease: Secondary | ICD-10-CM | POA: Diagnosis present

## 2018-01-26 DIAGNOSIS — J9621 Acute and chronic respiratory failure with hypoxia: Secondary | ICD-10-CM | POA: Diagnosis present

## 2018-01-26 DIAGNOSIS — F419 Anxiety disorder, unspecified: Secondary | ICD-10-CM | POA: Diagnosis present

## 2018-01-26 DIAGNOSIS — D649 Anemia, unspecified: Secondary | ICD-10-CM | POA: Diagnosis not present

## 2018-01-26 DIAGNOSIS — E8770 Fluid overload, unspecified: Secondary | ICD-10-CM | POA: Diagnosis present

## 2018-01-26 DIAGNOSIS — K219 Gastro-esophageal reflux disease without esophagitis: Secondary | ICD-10-CM | POA: Diagnosis present

## 2018-01-26 DIAGNOSIS — I2721 Secondary pulmonary arterial hypertension: Secondary | ICD-10-CM | POA: Diagnosis present

## 2018-01-26 DIAGNOSIS — I272 Pulmonary hypertension, unspecified: Secondary | ICD-10-CM | POA: Diagnosis not present

## 2018-01-26 LAB — BPAM RBC
BLOOD PRODUCT EXPIRATION DATE: 201908062359
Blood Product Expiration Date: 201908062359
ISSUE DATE / TIME: 201907041037
ISSUE DATE / TIME: 201907041510
UNIT TYPE AND RH: 5100
Unit Type and Rh: 5100

## 2018-01-26 LAB — BASIC METABOLIC PANEL
Anion gap: 6 (ref 5–15)
BUN: 17 mg/dL (ref 8–23)
CO2: 38 mmol/L — ABNORMAL HIGH (ref 22–32)
CREATININE: 1.49 mg/dL — AB (ref 0.44–1.00)
Calcium: 8.8 mg/dL — ABNORMAL LOW (ref 8.9–10.3)
Chloride: 95 mmol/L — ABNORMAL LOW (ref 98–111)
GFR calc Af Amer: 38 mL/min — ABNORMAL LOW (ref >60)
GFR, EST NON AFRICAN AMERICAN: 32 mL/min — AB (ref >60)
Glucose, Bld: 112 mg/dL — ABNORMAL HIGH (ref 70–99)
Potassium: 3.5 mmol/L (ref 3.5–5.1)
SODIUM: 139 mmol/L (ref 135–145)

## 2018-01-26 LAB — CBC WITH DIFFERENTIAL/PLATELET
Abs Immature Granulocytes: 0 10*3/uL (ref 0.0–0.1)
BASOS ABS: 0 10*3/uL (ref 0.0–0.1)
BASOS PCT: 0 %
EOS PCT: 0 %
Eosinophils Absolute: 0 10*3/uL (ref 0.0–0.7)
HCT: 34.9 % — ABNORMAL LOW (ref 36.0–46.0)
Hemoglobin: 10.5 g/dL — ABNORMAL LOW (ref 12.0–15.0)
Immature Granulocytes: 0 %
Lymphocytes Relative: 10 %
Lymphs Abs: 1.1 10*3/uL (ref 0.7–4.0)
MCH: 28 pg (ref 26.0–34.0)
MCHC: 30.1 g/dL (ref 30.0–36.0)
MCV: 93.1 fL (ref 78.0–100.0)
Monocytes Absolute: 1.3 10*3/uL — ABNORMAL HIGH (ref 0.1–1.0)
Monocytes Relative: 11 %
Neutro Abs: 8.9 10*3/uL — ABNORMAL HIGH (ref 1.7–7.7)
Neutrophils Relative %: 79 %
PLATELETS: 255 10*3/uL (ref 150–400)
RBC: 3.75 MIL/uL — ABNORMAL LOW (ref 3.87–5.11)
RDW: 17.1 % — ABNORMAL HIGH (ref 11.5–15.5)
WBC: 11.4 10*3/uL — ABNORMAL HIGH (ref 4.0–10.5)

## 2018-01-26 LAB — TYPE AND SCREEN
ABO/RH(D): O POS
ANTIBODY SCREEN: NEGATIVE
UNIT DIVISION: 0
Unit division: 0

## 2018-01-26 MED ORDER — FUROSEMIDE 10 MG/ML IJ SOLN
60.0000 mg | Freq: Two times a day (BID) | INTRAMUSCULAR | Status: DC
  Administered 2018-01-26 – 2018-01-27 (×2): 60 mg via INTRAVENOUS
  Filled 2018-01-26 (×2): qty 6

## 2018-01-26 MED ORDER — FUROSEMIDE 10 MG/ML IJ SOLN
20.0000 mg | Freq: Once | INTRAMUSCULAR | Status: AC
  Administered 2018-01-26: 20 mg via INTRAVENOUS
  Filled 2018-01-26: qty 2

## 2018-01-26 NOTE — Progress Notes (Signed)
Pt positioned from dangle to center of bed, SOB with moderate assistance, gave a extra dose of lasix with purwick in place,

## 2018-01-26 NOTE — Progress Notes (Signed)
Patient is active with [facility] as prior to admission for HHRN,PT,OT; B Emika Tiano RN,MHA,BSN [phone] 

## 2018-01-26 NOTE — Evaluation (Signed)
Physical Therapy Evaluation Patient Details Name: [name] MRN: [id number] DOB: 1941/03/21 Today's Date: 01/26/2018   History of Present Illness  Pt is a 77 y.o. 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 She presented with SOB and weakness.  Pt with recent hospitalization, discharging to SNF 6/4. Pt reports she had a bad experience and only stayed 2 nights. Since that time, she has been home receiving HH services.     Clinical Impression  Pt admitted with above diagnosis. Pt currently with functional limitations due to the deficits listed below (see PT Problem List). On eval, pt required mod assist bed mobility and min assist transfers. Unable to assess ambulation due to fatigue/SOB. Pt will benefit from skilled PT to increase their independence and safety with mobility to allow discharge to the venue listed below.  Pt agreeable to SNF. Spoke at length regarding her recent stay at [facility] following discharge from the hospital 12/26/17. She reports only staying 2 nights due to a bad experience but is willing to and prefers to return to that facility as it is closest to her house.      Follow Up Recommendations SNF    Equipment Recommendations  None recommended by PT    Recommendations for Other Services       Precautions / Restrictions Precautions Precautions: Fall;Other (comment) Precaution Comments: watch sats      Mobility  Bed Mobility Overal bed mobility: Needs Assistance Bed Mobility: Supine to Sit;Sit to Supine     Supine to sit: Mod assist;HOB elevated Sit to supine: Mod assist;HOB elevated   General bed mobility comments: +rail, increased time and effort  Transfers Overall transfer level: Needs assistance   Transfers: Stand Pivot Transfers   Stand pivot transfers: Min assist       General transfer comment: increased time and effort, SpO2 97% on 3  L  Ambulation/Gait             General Gait Details: unable due to fatigue/SOB  Stairs            Wheelchair Mobility    Modified Rankin (Stroke Patients Only)       Balance                                             Pertinent Vitals/Pain Pain Assessment: Faces Faces Pain Scale: Hurts a little bit Pain Location: RLE Pain Descriptors / Indicators: Sore;Tender Pain Intervention(s): Monitored during session    Home Living Family/patient expects to be discharged to:: Private residence Living Arrangements: Spouse/significant other;Children(son) Available Help at Discharge: Family;Available 24 hours/day(son is there at night, husband is w/c bound) Type of Home: House Home Access: Ramped entrance     Home Layout: One level Home Equipment: Walker - 4 wheels;Walker - 2 wheels;Hand held shower head;Shower seat;Bedside commode;Wheelchair - manual Additional Comments: Pt's husband is bilat amputee and utilizes power w/c. Son works as a Music therapist. He lives with them and is there at night (and can come during the day if needed.)     Prior Function Level of Independence: Needs assistance   Gait / Transfers Assistance Needed: ambulates short distances with rollator  ADL's / Homemaking Assistance Needed: son does home mgmt and driving  Comments: active with HHPT/OT      Hand  Dominance   Dominant Hand: Right    Extremity/Trunk Assessment   Upper Extremity Assessment Upper Extremity Assessment: Generalized weakness    Lower Extremity Assessment Lower Extremity Assessment: Generalized weakness    Cervical / Trunk Assessment Cervical / Trunk Assessment: Kyphotic  Communication   Communication: No difficulties  Cognition Arousal/Alertness: Awake/alert Behavior During Therapy: WFL for tasks assessed/performed Overall Cognitive Status: Within Functional Limits for tasks assessed                                         General Comments      Exercises     Assessment/Plan    PT Assessment Patient needs continued PT services  PT Problem List Decreased strength;Decreased mobility;Decreased activity tolerance;Cardiopulmonary status limiting activity       PT Treatment Interventions Therapeutic activities;Gait training;Therapeutic exercise;Patient/family education;Balance training;Functional mobility training    PT Goals (Current goals can be found in the Care Plan section)  Acute Rehab PT Goals Patient Stated Goal: get stronger and breathe better PT Goal Formulation: With patient Time For Goal Achievement: 02/09/18 Potential to Achieve Goals: Good    Frequency Min 3X/week   Barriers to discharge        Co-evaluation               AM-PAC PT "6 Clicks" Daily Activity  Outcome Measure Difficulty turning over in bed (including adjusting bedclothes, sheets and blankets)?: A Lot Difficulty moving from lying on back to sitting on the side of the bed? : Unable Difficulty sitting down on and standing up from a chair with arms (e.g., wheelchair, bedside commode, etc,.)?: Unable Help needed moving to and from a bed to chair (including a wheelchair)?: A Little Help needed walking in hospital room?: A Lot Help needed climbing 3-5 steps with a railing? : Total 6 Click Score: 10    End of Session Equipment Utilized During Treatment: Oxygen Activity Tolerance: Patient limited by fatigue Patient left: in bed;with call bell/phone within reach;with bed alarm set Nurse Communication: Mobility status PT Visit Diagnosis: Other abnormalities of gait and mobility (R26.89);Muscle weakness (generalized) (M62.81);Difficulty in walking, not elsewhere classified (R26.2)    Time: 4917-9150 PT Time Calculation (min) (ACUTE ONLY): 13 min   Charges:   PT Evaluation $PT Eval Moderate Complexity: 1 Mod     PT G Codes:        Aida Raider, PT  Office # [phone] Pager [phone]   Ilda Foil 01/26/2018, 1:27 PM

## 2018-01-26 NOTE — Progress Notes (Signed)
PROGRESS NOTE    [name]  GQQ:[id number] DOB: [birth year] DOA: 01/25/2018 PCP: Maurice Small, MD    Brief Narrative:  77 year old female who presented with dyspnea.  She does have the significant past medical history for pulmonary hypertension, COPD with chronic hypoxic respiratory failure, hypertension, dyslipidemia, coronary artery disease status post bypass graft, diastolic heart failure and obstructive sleep apnea.  Reported progressive dyspnea, associated with lower extremity edema and significant weight gain.  Patient was diagnosed with anemia as an outpatient and was scheduled for outpatient PRBC transfusion.  She presented to the emergency room due to worsening symptoms.  On the initial physical examination blood pressure 120/60, heart rate 54, respiratory rate 25, temperature 98, oxygen saturation 97%.  Moist mucous membranes, lungs with bibasilar rhonchi, increased work of breathing, heart S1-S2 present, rhythmic, abdomen protuberant, positive pitting lower extremity edema +++, bilaterally.  Sodium 142, potassium 4.5, chloride 99, bicarb 36, glucose 108, BUN 22, creatinine 1.56, BNP 507, white count 9.1, hemoglobin 8.2, hematocrit 29.2, platelets 322.  Chest x-ray with increased interstitial markings bilaterally, left base atelectasis.  EKG sinus rhythm, left axis deviation, left anterior fascicular block, right bundle branch block.  Patient was admitted to the hospital with a working diagnosis of acute decompensation of diastolic heart failure, complicated by symptomatic anemia.   Assessment & Plan:   Principal Problem:   Acute on chronic respiratory failure with hypercapnia (HCC) Active Problems:   Pulmonary hypertension (HCC)   Chronic anticoagulation   HTN (hypertension)   Acute on chronic diastolic heart failure (HCC)   Symptomatic anemia   1. Acute on chronic diastolic heart failure decompensation with acute hypoxic respiratory failure due to cardiogenic pulmonary edema  (present on admission). Will continue diuresis with IV furosemide, will increase dose to 60 mg IV q12 hours, to target negative fluid balance. This am continue to have dyspnea and signs of volume overload. Continue heart failure management with isosorbide and metoprolol. Urine output 1600 ml over last 24 hours.   2. Symptomatic iron deficiency anemia. Patient is sp 2 units PRBc transfusion with stable hb and hct, no signs of active bleeding. Iron panel from 05/19 with Iron 26, TIBC 376, transferrin saturation 5. Will order IV iron. Per patient she has been told to be high risk for colonoscopy.    3. COPD with pulmonary hypertension. Will continue oxymetry monitoring and supplemental 02 per [place]. No signs of COPD exacerbation.   4. CKD stage 3. Will continue furosemide for diuresis, base cr at 1,3 (personally reviewed old records), will continue to follow on renal panel in am, avoid hypotension or nephrotoxic medications.   5. Chronic atrial fibrillation. Will continue rate control with amiodarone and metoprolol anticoagulation with rivaroxaban.   6. HTN. Continue blood pressure control with metoprolol and amlodipine.   DVT prophylaxis: rivaroxaban   Code Status:  dnr  Family Communication: no family at the bedside  Disposition Plan/ discharge barriers: pending improvement and volume status and physical therapy evaluation    Consultants:     Procedures:     Antimicrobials:       Subjective: Patient complains of persistent dyspnea, worse with exertion, associated with lower extremity edema, no nausea or vomiting, no chest pain or palpitations.   Objective: Vitals:   01/26/18 0428 01/26/18 0719 01/26/18 0819 01/26/18 1009  BP: (!) 112/39  (!) 105/51 (!) 124/50  Pulse: 60  60 60  Resp: 20  20   Temp: 98.1 F (36.7 C)  97.8 F (36.6 C)  TempSrc: Oral  Oral   SpO2: 99% 91% 95%   Weight: 90.8 kg (200 lb 1.6 oz)     Height:        Intake/Output Summary (Last 24 hours) at  01/26/2018 1041 Last data filed at 01/26/2018 0904 Gross per 24 hour  Intake 1595 ml  Output 1600 ml  Net -5 ml   Filed Weights   01/25/18 0743 01/25/18 1226 01/26/18 0428  Weight: 86.2 kg (190 lb) 89.4 kg (197 lb 1.5 oz) 90.8 kg (200 lb 1.6 oz)    Examination:   General: Not in pain or dyspnea, deconditioned  Neurology: Awake and alert, non focal  E ENT: positive pallor, no icterus, oral mucosa moist Cardiovascular: No JVD, short neck.. S1-S2 present, rhythmic, no gallops, rubs, or murmurs. +++ lower extremity edema, bilaterally  Pulmonary: positive breath sounds bilaterally, mild expiratory wheezing, no rhonchi, but diffuse bilateral rales. Gastrointestinal. Abdomen protuberant with no organomegaly, non tender, no rebound or guarding Skin. No rashes Musculoskeletal: no joint deformities     Data Reviewed: I have personally reviewed following labs and imaging studies  CBC: Recent Labs  Lab 01/25/18 0749 01/26/18 0516  WBC 9.1 11.4*  NEUTROABS 6.3 8.9*  HGB 8.2* 10.5*  HCT 29.2* 34.9*  MCV 98.3 93.1  PLT 322 255   Basic Metabolic Panel: Recent Labs  Lab 01/25/18 0749 01/26/18 0516  NA 142 139  K 4.5 3.5  CL 99 95*  CO2 36* 38*  GLUCOSE 108* 112*  BUN 22 17  CREATININE 1.56* 1.49*  CALCIUM 9.0 8.8*   GFR: Estimated Creatinine Clearance: 35.3 mL/min (A) (by C-G formula based on SCr of 1.49 mg/dL (H)). Liver Function Tests: Recent Labs  Lab 01/25/18 0749  AST 34  ALT 17  ALKPHOS 58  BILITOT 0.9  PROT 5.4*  ALBUMIN 2.9*   No results for input(s): LIPASE, AMYLASE in the last 168 hours. No results for input(s): AMMONIA in the last 168 hours. Coagulation Profile: No results for input(s): INR, PROTIME in the last 168 hours. Cardiac Enzymes: No results for input(s): CKTOTAL, CKMB, CKMBINDEX, TROPONINI in the last 168 hours. BNP (last 3 results) No results for input(s): PROBNP in the last 8760 hours. HbA1C: No results for input(s): HGBA1C in the last 72  hours. CBG: No results for input(s): GLUCAP in the last 168 hours. Lipid Profile: No results for input(s): CHOL, HDL, LDLCALC, TRIG, CHOLHDL, LDLDIRECT in the last 72 hours. Thyroid Function Tests: No results for input(s): TSH, T4TOTAL, FREET4, T3FREE, THYROIDAB in the last 72 hours. Anemia Panel: No results for input(s): VITAMINB12, FOLATE, FERRITIN, TIBC, IRON, RETICCTPCT in the last 72 hours.    Radiology Studies: I have reviewed all of the imaging during this hospital visit personally     Scheduled Meds: . sodium chloride   Intravenous Once  . amiodarone  200 mg Oral Daily  . clonazePAM  0.5 mg Oral QHS  . furosemide  40 mg Intravenous Q12H  . ipratropium  0.5 mg Nebulization QID  . isosorbide mononitrate  60 mg Oral q1800  . loratadine  10 mg Oral Daily  . metoprolol tartrate  50 mg Oral BID  . pantoprazole  40 mg Oral Daily  . Rivaroxaban  15 mg Oral Q supper  . simvastatin  60 mg Oral q1800  . sodium chloride flush  3 mL Intravenous Q12H   Continuous Infusions: . sodium chloride       LOS: 0 days        Nathanie Ottley  Gerome Apley, MD [facility] Pager [phone]

## 2018-01-27 DIAGNOSIS — J441 Chronic obstructive pulmonary disease with (acute) exacerbation: Secondary | ICD-10-CM

## 2018-01-27 DIAGNOSIS — I5033 Acute on chronic diastolic (congestive) heart failure: Secondary | ICD-10-CM

## 2018-01-27 LAB — CBC WITH DIFFERENTIAL/PLATELET
ABS IMMATURE GRANULOCYTES: 0.1 10*3/uL (ref 0.0–0.1)
BASOS PCT: 0 %
Basophils Absolute: 0 10*3/uL (ref 0.0–0.1)
EOS ABS: 0 10*3/uL (ref 0.0–0.7)
Eosinophils Relative: 0 %
HCT: 39 % (ref 36.0–46.0)
Hemoglobin: 11.2 g/dL — ABNORMAL LOW (ref 12.0–15.0)
IMMATURE GRANULOCYTES: 1 %
Lymphocytes Relative: 4 %
Lymphs Abs: 0.6 10*3/uL — ABNORMAL LOW (ref 0.7–4.0)
MCH: 27.6 pg (ref 26.0–34.0)
MCHC: 28.7 g/dL — ABNORMAL LOW (ref 30.0–36.0)
MCV: 96.1 fL (ref 78.0–100.0)
MONO ABS: 1.5 10*3/uL — AB (ref 0.1–1.0)
MONOS PCT: 10 %
NEUTROS ABS: 12.2 10*3/uL — AB (ref 1.7–7.7)
NEUTROS PCT: 85 %
PLATELETS: 252 10*3/uL (ref 150–400)
RBC: 4.06 MIL/uL (ref 3.87–5.11)
RDW: 16.1 % — AB (ref 11.5–15.5)
WBC: 14.4 10*3/uL — ABNORMAL HIGH (ref 4.0–10.5)

## 2018-01-27 LAB — BASIC METABOLIC PANEL
Anion gap: 8 (ref 5–15)
BUN: 15 mg/dL (ref 8–23)
CALCIUM: 9 mg/dL (ref 8.9–10.3)
CO2: 40 mmol/L — ABNORMAL HIGH (ref 22–32)
Chloride: 95 mmol/L — ABNORMAL LOW (ref 98–111)
Creatinine, Ser: 1.15 mg/dL — ABNORMAL HIGH (ref 0.44–1.00)
GFR calc Af Amer: 51 mL/min — ABNORMAL LOW (ref >60)
GFR, EST NON AFRICAN AMERICAN: 44 mL/min — AB (ref >60)
GLUCOSE: 108 mg/dL — AB (ref 70–99)
Potassium: 3.5 mmol/L (ref 3.5–5.1)
SODIUM: 143 mmol/L (ref 135–145)

## 2018-01-27 MED ORDER — CLONAZEPAM 0.5 MG PO TABS
1.0000 mg | ORAL_TABLET | Freq: Every day | ORAL | Status: DC
  Administered 2018-01-27 – 2018-01-31 (×5): 1 mg via ORAL
  Filled 2018-01-27 (×5): qty 2

## 2018-01-27 MED ORDER — BUDESONIDE 0.5 MG/2ML IN SUSP
0.5000 mg | Freq: Two times a day (BID) | RESPIRATORY_TRACT | Status: DC
  Administered 2018-01-27 – 2018-01-28 (×2): 0.5 mg via RESPIRATORY_TRACT
  Filled 2018-01-27 (×7): qty 2

## 2018-01-27 MED ORDER — FUROSEMIDE 40 MG PO TABS
60.0000 mg | ORAL_TABLET | Freq: Every day | ORAL | Status: DC
  Administered 2018-01-28 – 2018-02-01 (×5): 60 mg via ORAL
  Filled 2018-01-27 (×6): qty 1

## 2018-01-27 MED ORDER — SODIUM CHLORIDE 0.9 % IV SOLN
510.0000 mg | INTRAVENOUS | Status: DC
  Administered 2018-01-27: 510 mg via INTRAVENOUS
  Filled 2018-01-27: qty 17

## 2018-01-27 MED ORDER — DICLOFENAC SODIUM 1 % TD GEL
2.0000 g | Freq: Four times a day (QID) | TRANSDERMAL | Status: DC
  Administered 2018-01-27 – 2018-02-01 (×19): 2 g via TOPICAL
  Filled 2018-01-27: qty 100

## 2018-01-27 MED ORDER — METHYLPREDNISOLONE SODIUM SUCC 40 MG IJ SOLR
40.0000 mg | Freq: Every day | INTRAMUSCULAR | Status: DC
  Administered 2018-01-27 – 2018-01-30 (×4): 40 mg via INTRAVENOUS
  Filled 2018-01-27 (×4): qty 1

## 2018-01-27 NOTE — Progress Notes (Signed)
PROGRESS NOTE    [name]  ZOX:[id number] DOB: 07-14-1941 DOA: 01/25/2018 PCP: Maurice Small, MD    Brief Narrative:  77 year old female who presented with dyspnea.  She does have the significant past medical history for pulmonary hypertension, COPD with chronic hypoxic respiratory failure, hypertension, dyslipidemia, coronary artery disease status post bypass graft, diastolic heart failure and obstructive sleep apnea.  Reported progressive dyspnea, associated with lower extremity edema and significant weight gain.  Patient was diagnosed with anemia as an outpatient and was scheduled for outpatient PRBC transfusion.  She presented to the emergency room due to worsening symptoms.  On the initial physical examination blood pressure 120/60, heart rate 54, respiratory rate 25, temperature 98, oxygen saturation 97%.  Moist mucous membranes, lungs with bibasilar rhonchi, increased work of breathing, heart S1-S2 present, rhythmic, abdomen protuberant, positive pitting lower extremity edema +++, bilaterally.  Sodium 142, potassium 4.5, chloride 99, bicarb 36, glucose 108, BUN 22, creatinine 1.56, BNP 507, white count 9.1, hemoglobin 8.2, hematocrit 29.2, platelets 322.  Chest x-ray with increased interstitial markings bilaterally, left base atelectasis.  EKG sinus rhythm, left axis deviation, left anterior fascicular block, right bundle branch block.  Patient was admitted to the hospital with a working diagnosis of acute decompensation of diastolic heart failure, complicated by symptomatic anemia.     Assessment & Plan:   Principal Problem:   Acute on chronic respiratory failure with hypercapnia (HCC) Active Problems:   Pulmonary hypertension (HCC)   Chronic anticoagulation   HTN (hypertension)   Acute on chronic diastolic heart failure (HCC)   Symptomatic anemia   Heart failure (HCC)   1. Acute on chronic diastolic heart failure decompensation with acute hypoxic respiratory failure due to  cardiogenic pulmonary edema (present on admission). Patient more euvolemic, will continue furosemide po daily and heart failure management with isosorbide and metoprolol. Urine output 2,650 ml over last 24 hours.   2. COPD exacerbation complicated with pulmonary hypertension. Patient with poor air movement and prolonged expiratory phase, will continue bronchodilator therapy with ipratropium every 6 hours, will add inhaled and systemic steroids, patient not tolerated albuterol well in the past.  4. AKI on CKD stage 3. Improved renal function with serum cr down to 1,15, with K at 3,5 and serum bicarbonate up to 40. Will continue diuresis with po furosemide, follow on renal panel in am, avoid hypotension or nephrotoxic medications.   5. Chronic atrial fibrillation. On amiodarone and metoprolol for rate control, continue anticoagulation with rivaroxaban.   6. HTN. On metoprolol and amlodipine, blood pressure systolic 113 mmHg.  7. Insomnia. Will increase clonazepam to 1 mg at night.   DVT prophylaxis: rivaroxaban   Code Status:  dnr  Family Communication: I spoke with patient's son at the bedside and all questions were addressed.  Disposition Plan/ discharge barriers: pending improvement and volume status and physical therapy evaluation    Consultants:     Procedures:     Antimicrobials:       Subjective: Patient with persistent dyspnea, moderate in intensity, worse with exertion, no wheezing or coughing, no nausea or vomiting, persistent lower extremity edema. No nausea or vomiting, no chest pain.   Objective: Vitals:   01/27/18 0415 01/27/18 0511 01/27/18 0936 01/27/18 1154  BP:  (!) 114/33  (!) 113/42  Pulse:  66  61  Resp:  (!) 22  20  Temp:  98.4 F (36.9 C)  98 F (36.7 C)  TempSrc:  Oral  Oral  SpO2:  98% 96% 93%  Weight: 89.6 kg (197 lb 9.6 oz)     Height:        Intake/Output Summary (Last 24 hours) at 01/27/2018 1218 Last data filed at 01/27/2018  0900 Gross per 24 hour  Intake 786 ml  Output 1850 ml  Net -1064 ml   Filed Weights   01/25/18 1226 01/26/18 0428 01/27/18 0415  Weight: 89.4 kg (197 lb 1.5 oz) 90.8 kg (200 lb 1.6 oz) 89.6 kg (197 lb 9.6 oz)    Examination:   General: deconditioned and dyspneic  Neurology: Awake and alert, non focal  E ENT: positive pallor, no icterus, oral mucosa moist Cardiovascular: No JVD. S1-S2 present, rhythmic, no gallops, or rubs, positive systolic murmurs at the apex. +++ pitting lower extremity edema. Pulmonary: decreased breath sounds bilaterally, decreased air movement with prolonged expiratory phase, no wheezing, rhonchi, positive bibasilar rales. Gastrointestinal. Abdomen with no organomegaly, non tender, no rebound or guarding Skin. No rashes Musculoskeletal: no joint deformities     Data Reviewed: I have personally reviewed following labs and imaging studies  CBC: Recent Labs  Lab 01/25/18 0749 01/26/18 0516 01/27/18 0507  WBC 9.1 11.4* 14.4*  NEUTROABS 6.3 8.9* 12.2*  HGB 8.2* 10.5* 11.2*  HCT 29.2* 34.9* 39.0  MCV 98.3 93.1 96.1  PLT 322 255 252   Basic Metabolic Panel: Recent Labs  Lab 01/25/18 0749 01/26/18 0516 01/27/18 0507  NA 142 139 143  K 4.5 3.5 3.5  CL 99 95* 95*  CO2 36* 38* 40*  GLUCOSE 108* 112* 108*  BUN 22 17 15   CREATININE 1.56* 1.49* 1.15*  CALCIUM 9.0 8.8* 9.0   GFR: Estimated Creatinine Clearance: 45.4 mL/min (A) (by C-G formula based on SCr of 1.15 mg/dL (H)). Liver Function Tests: Recent Labs  Lab 01/25/18 0749  AST 34  ALT 17  ALKPHOS 58  BILITOT 0.9  PROT 5.4*  ALBUMIN 2.9*   No results for input(s): LIPASE, AMYLASE in the last 168 hours. No results for input(s): AMMONIA in the last 168 hours. Coagulation Profile: No results for input(s): INR, PROTIME in the last 168 hours. Cardiac Enzymes: No results for input(s): CKTOTAL, CKMB, CKMBINDEX, TROPONINI in the last 168 hours. BNP (last 3 results) No results for input(s):  PROBNP in the last 8760 hours. HbA1C: No results for input(s): HGBA1C in the last 72 hours. CBG: No results for input(s): GLUCAP in the last 168 hours. Lipid Profile: No results for input(s): CHOL, HDL, LDLCALC, TRIG, CHOLHDL, LDLDIRECT in the last 72 hours. Thyroid Function Tests: No results for input(s): TSH, T4TOTAL, FREET4, T3FREE, THYROIDAB in the last 72 hours. Anemia Panel: No results for input(s): VITAMINB12, FOLATE, FERRITIN, TIBC, IRON, RETICCTPCT in the last 72 hours.    Radiology Studies: I have reviewed all of the imaging during this hospital visit personally     Scheduled Meds: . sodium chloride   Intravenous Once  . amiodarone  200 mg Oral Daily  . clonazePAM  0.5 mg Oral QHS  . diclofenac sodium  2 g Topical QID  . furosemide  60 mg Intravenous q12n4p  . ipratropium  0.5 mg Nebulization QID  . isosorbide mononitrate  60 mg Oral q1800  . loratadine  10 mg Oral Daily  . metoprolol tartrate  50 mg Oral BID  . pantoprazole  40 mg Oral Daily  . Rivaroxaban  15 mg Oral Q supper  . simvastatin  60 mg Oral q1800  . sodium chloride flush  3 mL Intravenous Q12H   Continuous Infusions: .  sodium chloride 250 mL (01/27/18 0808)  . ferumoxytol 510 mg (01/27/18 0809)     LOS: 1 day        Mauricio Annett Gula, MD [facility] Pager [phone]

## 2018-01-27 NOTE — [place] FL2 (Signed)
[place] MEDICAID FL2 LEVEL OF CARE SCREENING TOOL     IDENTIFICATION  Patient Name: [name] Birthdate: 02-09-1941 Sex: female Admission Date (Current Location): 01/25/2018  [facility] and [place] Number:  Producer, television/film/video and Address:  [facility] [place], [address], [place], [place] [address]      Provider Number: [id number]  Attending Physician Name and Address:  Coralie Keens,*  Relative Name and Phone Number:  Catricia Scheerer [phone]    Current Level of Care: Hospital Recommended Level of Care: Skilled Nursing Facility Prior Approval Number:    Date Approved/Denied:   PASRR Number:    Discharge Plan: SNF    Current Diagnoses: Patient Active Problem List   Diagnosis Date Noted  . Heart failure (HCC) 01/26/2018  . Symptomatic anemia 01/25/2018  . Respiratory distress 12/13/2017  . Metabolic alkalosis 12/13/2017  . Gait disturbance 11/21/2017  . Lung nodule 11/07/2016  . Mitral valve stenosis   . Rheumatic aortic valve disease   . Atrial fibrillation (HCC) 06/12/2016  . Anemia   . Chronic respiratory failure with hypoxia (HCC) 04/20/2016  . Rheumatic mitral valve disease   . PFO (patent foramen ovale)   . Cardiomyopathy (HCC)   . Acute on chronic diastolic heart failure (HCC)   . Chronic atrial fibrillation (HCC)   . Alcoholic cardiomyopathy (HCC)   . Aortic valve vegetation 09/24/2015  . Lumbar spine scoliosis 04/18/2015  . Mitral stenosis 02/10/2015  . Chronic diastolic heart failure (HCC) 05/10/2013  . Atrial flutter (HCC) 05/10/2013  . Chronic anticoagulation 05/10/2013  . HTN (hypertension) 05/10/2013  . Dyslipidemia, goal LDL below 70 05/10/2013  . Seasonal allergic rhinitis 04/11/2012  . Acute on chronic respiratory failure with hypercapnia (HCC) 11/02/2011  . DYSPNEA 03/14/2008  . Coronary atherosclerosis 02/27/2008  . COPD exacerbation (HCC) 02/27/2008  . Pulmonary hypertension (HCC) 02/21/2008  .  DIASTOLIC DYSFUNCTION 02/21/2008    Orientation RESPIRATION BLADDER Height & Weight     Self, Time, Situation, Place  Other (Comment)(Nasal Cannula 2.5L) External catheter Weight: 197 lb 9.6 oz (89.6 kg) Height:  5\' 6"  (167.6 cm)  BEHAVIORAL SYMPTOMS/MOOD NEUROLOGICAL BOWEL NUTRITION STATUS      Continent Diet(Heart diet, Thin Fluids)  AMBULATORY STATUS COMMUNICATION OF NEEDS Skin   Limited Assist Verbally Normal                       Personal Care Assistance Level of Assistance  Bathing, Feeding, Dressing Bathing Assistance: Limited assistance Feeding assistance: Limited assistance Dressing Assistance: Limited assistance     Functional Limitations Info  Sight, Hearing, Speech Sight Info: Adequate Hearing Info: Adequate Speech Info: Adequate    SPECIAL CARE FACTORS FREQUENCY                       Contractures      Additional Factors Info  Code Status(DNR) Code Status Info: DNR             Current Medications (01/27/2018):  This is the current hospital active medication list Current Facility-Administered Medications  Medication Dose Route Frequency Provider Last Rate Last Dose  . 0.9 %  sodium chloride infusion (Manually program via Guardrails IV Fluids)   Intravenous Once Jonah Blue, MD      . 0.9 %  sodium chloride infusion  250 mL Intravenous PRN Jonah Blue, MD   Stopped at 01/27/18 [phone]  . acetaminophen (TYLENOL) tablet 650 mg  650 mg Oral Q4H  PRN Jonah Blue, MD   650 mg at 01/27/18 [phone]  . amiodarone (PACERONE) tablet 200 mg  200 mg Oral Daily Jonah Blue, MD   200 mg at 01/27/18 0804  . budesonide (PULMICORT) nebulizer solution 0.5 mg  0.5 mg Nebulization BID Arrien, York Ram, MD      . clonazePAM Scarlette Calico) tablet 1 mg  1 mg Oral QHS Arrien, York Ram, MD      . diclofenac sodium (VOLTAREN) 1 % transdermal gel 2 g  2 g Topical QID Coralie Keens, MD   2 g at 01/27/18 1237  . [START ON 01/28/2018] furosemide  (LASIX) tablet 60 mg  60 mg Oral Daily Arrien, York Ram, MD      . ipratropium (ATROVENT) nebulizer solution 0.5 mg  0.5 mg Nebulization QID Jonah Blue, MD   0.5 mg at 01/27/18 1519  . isosorbide mononitrate (IMDUR) 24 hr tablet 60 mg  60 mg Oral q1800 Jonah Blue, MD   60 mg at 01/25/18 1721  . loratadine (CLARITIN) tablet 10 mg  10 mg Oral Daily Jonah Blue, MD   10 mg at 01/27/18 0804  . methylPREDNISolone sodium succinate (SOLU-MEDROL) 40 mg/mL injection 40 mg  40 mg Intravenous Daily Arrien, York Ram, MD   40 mg at 01/27/18 1236  . metoprolol tartrate (LOPRESSOR) tablet 50 mg  50 mg Oral BID Jonah Blue, MD   50 mg at 01/27/18 0804  . ondansetron (ZOFRAN) injection 4 mg  4 mg Intravenous Q6H PRN Jonah Blue, MD      . pantoprazole (PROTONIX) EC tablet 40 mg  40 mg Oral Daily Jonah Blue, MD   40 mg at 01/27/18 0804  . Rivaroxaban (XARELTO) tablet 15 mg  15 mg Oral Q supper Jonah Blue, MD   15 mg at 01/25/18 1721  . simvastatin (ZOCOR) tablet 60 mg  60 mg Oral q1800 Jonah Blue, MD   60 mg at 01/25/18 1721  . sodium chloride flush (NS) 0.9 % injection 3 mL  3 mL Intravenous Q12H Jonah Blue, MD   3 mL at 01/27/18 0805  . sodium chloride flush (NS) 0.9 % injection 3 mL  3 mL Intravenous PRN Jonah Blue, MD         Discharge Medications: Please see discharge summary for a list of discharge medications.  Relevant Imaging Results:  Relevant Lab Results:   Additional Information SN: [id number]  Mamie Nick, LCSW

## 2018-01-27 NOTE — Progress Notes (Signed)
Physical Therapy Treatment Patient Details Name: [name] MRN: [id number] DOB: [birth year] Today's Date: 01/27/2018    History of Present Illness Pt is a 77 y.o. 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 She presented with SOB and weakness.  Pt with recent hospitalization, discharging to SNF 6/4. Pt reports she had a bad experience and only stayed 2 nights. Since that time, she has been home receiving HH services.     PT Comments    Pt just completed breathing treatment on arrival to room. SpO2 at 87% on 2.5L O2. O2 increased to 3L prior to mobilizing. Pt was able to ambulate into hall with 1 seated rest brake secondary to decreased SpO2. Lowest reliable reading during mobility was 80%. O2 increased to 4L at end of session for pt to recover. Will continue to follow to maximize functional independence and activity tolerance.     Follow Up Recommendations  SNF     Equipment Recommendations  None recommended by PT    Recommendations for Other Services       Precautions / Restrictions Precautions Precautions: Fall;Other (comment) Precaution Comments: watch sats Restrictions Weight Bearing Restrictions: No    Mobility  Bed Mobility Overal bed mobility: Needs Assistance             General bed mobility comments: sitting EOB on arrival  Transfers Overall transfer level: Needs assistance Equipment used: 4-wheeled walker Transfers: Sit to/from Stand Sit to Stand: Min assist         General transfer comment: min A to steady, increased time and effort  Ambulation/Gait Ambulation/Gait assistance: Min guard Gait Distance (Feet): 75 Feet Assistive device: 4-wheeled walker Gait Pattern/deviations: Step-through pattern;Decreased step length - right;Decreased step length - left;Trunk flexed Gait velocity: decreased   General Gait Details: Close min guard secondary to SOB. Pt  required 1 seated rest break. Unable to obtain reliable SpO2 reading, however based on pt's symptoms, increased O2 from 3 to 4L.    Stairs             Wheelchair Mobility    Modified Rankin (Stroke Patients Only)       Balance Overall balance assessment: Needs assistance Sitting-balance support: Bilateral upper extremity supported;Feet supported Sitting balance-Leahy Scale: Poor Sitting balance - Comments: able to sit EOB w/o physical assist, but requires UE support   Standing balance support: Bilateral upper extremity supported Standing balance-Leahy Scale: Poor Standing balance comment: reliant on UE support                            Cognition Arousal/Alertness: Awake/alert Behavior During Therapy: WFL for tasks assessed/performed Overall Cognitive Status: Within Functional Limits for tasks assessed                                        Exercises      General Comments        Pertinent Vitals/Pain Pain Assessment: No/denies pain    Home Living                      Prior Function            PT Goals (current goals can now be found in the care plan section) Acute Rehab PT Goals Patient  Stated Goal: get stronger and breathe better PT Goal Formulation: With patient Time For Goal Achievement: 02/09/18 Potential to Achieve Goals: Good Progress towards PT goals: Progressing toward goals    Frequency    Min 3X/week      PT Plan Current plan remains appropriate    Co-evaluation              AM-PAC PT "6 Clicks" Daily Activity  Outcome Measure  Difficulty turning over in bed (including adjusting bedclothes, sheets and blankets)?: A Lot Difficulty moving from lying on back to sitting on the side of the bed? : Unable Difficulty sitting down on and standing up from a chair with arms (e.g., wheelchair, bedside commode, etc,.)?: Unable Help needed moving to and from a bed to chair (including a wheelchair)?: A  Little Help needed walking in hospital room?: A Little Help needed climbing 3-5 steps with a railing? : Total 6 Click Score: 11    End of Session Equipment Utilized During Treatment: Oxygen;Gait belt Activity Tolerance: Patient tolerated treatment well Patient left: with call bell/phone within reach;in chair;with chair alarm set Nurse Communication: Mobility status(Drop in O2 sats) PT Visit Diagnosis: Other abnormalities of gait and mobility (R26.89);Muscle weakness (generalized) (M62.81);Difficulty in walking, not elsewhere classified (R26.2)     Time: 4098-1191 PT Time Calculation (min) (ACUTE ONLY): 29 min  Charges:  $Gait Training: 23-37 mins                    G Codes:       Kallie Locks, [place] Pager [phone] Acute Rehab  Sheral Apley 01/27/2018, 4:08 PM

## 2018-01-27 NOTE — Clinical Social Work Note (Signed)
Clinical Social Work Assessment  Patient Details  Name: [name] MRN: [id number] Date of Birth: Dec 28, 1940  Date of referral:  01/27/18               Reason for consult:  Discharge Planning                Permission sought to share information with:  Family Supports Permission granted to share information::  Yes, Verbal Permission Granted  Name::     [name]  Agency::     Relationship::  Son  Contact Information:  [phone]  Housing/Transportation Living arrangements for the past 2 months:  Single Family Home Source of Information:  Patient, Adult Children Patient Interpreter Needed:  None Criminal Activity/Legal Involvement Pertinent to Current Situation/Hospitalization:  No - Comment as needed Significant Relationships:  Adult Children Lives with:  Adult Children Do you feel safe going back to the place where you live?  Yes Need for family participation in patient care:  Yes (Comment)  Care giving concerns:  CSW received consult regarding SNF placement. CSW spoke with pt & pt's son. Pt resides at home with son. Pt son would like for pt to reside at SNF until pt is stronger. CSW to continue to follow and assist with discharge planning needs.    Social Worker assessment / plan:  CSW spoke with pt and pt son regarding SNF placement. Pt and pt son are agreeable for placement.  Employment status:  Retired Database administrator PT Recommendations:  Skilled Nursing Facility Information / Referral to community resources:  Skilled Nursing Facility  Patient/Family's Response to care:  Patient and patient son reports agreement with discharge plan.  Patient/Family's Understanding of and Emotional Response to Diagnosis, Current Treatment, and Prognosis:  Pt/ son is realistic regarding therapy needs and expressed being hopeful for SNF placement. Pt and Pt son expressed understanding of CSW role and discharge process as well as medical condition. No questions or  concerns at this time.   Emotional Assessment Appearance:  Developmentally appropriate Attitude/Demeanor/Rapport:  Lethargic Affect (typically observed):  Calm, Accepting Orientation:  Oriented to Situation, Oriented to Self, Oriented to Place, Oriented to  Time Alcohol / Substance use:  Not Applicable Psych involvement (Current and /or in the community):  No (Comment)  Discharge Needs  Concerns to be addressed:  Discharge Planning Concerns Readmission within the last 30 days:  Yes Current discharge risk:  None Barriers to Discharge:  No Barriers Identified   Monica Kovacic, LCSW 01/27/2018, 3:51 PM

## 2018-01-28 DIAGNOSIS — J440 Chronic obstructive pulmonary disease with acute lower respiratory infection: Secondary | ICD-10-CM

## 2018-01-28 DIAGNOSIS — J209 Acute bronchitis, unspecified: Secondary | ICD-10-CM

## 2018-01-28 LAB — CBC WITH DIFFERENTIAL/PLATELET
Abs Immature Granulocytes: 0.1 10*3/uL (ref 0.0–0.1)
BASOS ABS: 0 10*3/uL (ref 0.0–0.1)
BASOS PCT: 0 %
EOS ABS: 0 10*3/uL (ref 0.0–0.7)
Eosinophils Relative: 0 %
HCT: 38 % (ref 36.0–46.0)
Hemoglobin: 10.8 g/dL — ABNORMAL LOW (ref 12.0–15.0)
Immature Granulocytes: 1 %
Lymphocytes Relative: 4 %
Lymphs Abs: 0.4 10*3/uL — ABNORMAL LOW (ref 0.7–4.0)
MCH: 27.6 pg (ref 26.0–34.0)
MCHC: 28.4 g/dL — ABNORMAL LOW (ref 30.0–36.0)
MCV: 96.9 fL (ref 78.0–100.0)
MONOS PCT: 8 %
Monocytes Absolute: 0.7 10*3/uL (ref 0.1–1.0)
NEUTROS PCT: 87 %
Neutro Abs: 8.2 10*3/uL — ABNORMAL HIGH (ref 1.7–7.7)
PLATELETS: 242 10*3/uL (ref 150–400)
RBC: 3.92 MIL/uL (ref 3.87–5.11)
RDW: 15.6 % — AB (ref 11.5–15.5)
WBC: 9.4 10*3/uL (ref 4.0–10.5)

## 2018-01-28 LAB — BASIC METABOLIC PANEL WITH GFR
Anion gap: 8 (ref 5–15)
BUN: 18 mg/dL (ref 8–23)
CO2: 42 mmol/L — ABNORMAL HIGH (ref 22–32)
Calcium: 9.8 mg/dL (ref 8.9–10.3)
Chloride: 93 mmol/L — ABNORMAL LOW (ref 98–111)
Creatinine, Ser: 0.96 mg/dL (ref 0.44–1.00)
GFR calc Af Amer: >60 mL/min
GFR calc non Af Amer: 55 mL/min — ABNORMAL LOW
Glucose, Bld: 123 mg/dL — ABNORMAL HIGH (ref 70–99)
Potassium: 3.5 mmol/L (ref 3.5–5.1)
Sodium: 143 mmol/L (ref 135–145)

## 2018-01-28 MED ORDER — IPRATROPIUM BROMIDE 0.02 % IN SOLN
0.5000 mg | Freq: Four times a day (QID) | RESPIRATORY_TRACT | Status: DC
Start: 2018-01-29 — End: 2018-02-01
  Administered 2018-01-29 – 2018-02-01 (×14): 0.5 mg via RESPIRATORY_TRACT
  Filled 2018-01-28 (×13): qty 2.5

## 2018-01-28 MED ORDER — LEVALBUTEROL HCL 0.63 MG/3ML IN NEBU: 0.6300 mg | INHALATION_SOLUTION | Freq: Four times a day (QID) | RESPIRATORY_TRACT | Status: DC | PRN

## 2018-01-28 MED ORDER — LEVALBUTEROL TARTRATE 45 MCG/ACT IN AERO: 1.0000 | INHALATION_SPRAY | Freq: Four times a day (QID) | RESPIRATORY_TRACT | Status: DC

## 2018-01-28 MED ORDER — LEVALBUTEROL HCL 0.63 MG/3ML IN NEBU
0.6300 mg | INHALATION_SOLUTION | Freq: Four times a day (QID) | RESPIRATORY_TRACT | Status: DC
  Filled 2018-01-28 (×2): qty 3

## 2018-01-28 MED ORDER — IPRATROPIUM BROMIDE 0.02 % IN SOLN
0.5000 mg | Freq: Four times a day (QID) | RESPIRATORY_TRACT | Status: DC
  Administered 2018-01-28: 0.5 mg via RESPIRATORY_TRACT
  Filled 2018-01-28 (×2): qty 2.5

## 2018-01-28 NOTE — Progress Notes (Signed)
PROGRESS NOTE    [name]  XBJ:[id number] DOB: 08-Mar-1941 DOA: 01/25/2018 PCP: Maurice Small, MD    Brief Narrative:  77 year old female who presented with dyspnea. She does have thesignificant past medical history for pulmonary hypertension, COPD with chronic hypoxic respiratory failure, hypertension, dyslipidemia, coronary artery disease status post bypass graft, diastolic heart failure and obstructive sleep apnea. Reported progressive dyspnea, associated with lower extremity edema and significant weight gain.Patient was diagnosed with anemia as an outpatient and was scheduled for outpatient PRBC transfusion. She presented to the emergency room due to worsening symptoms. On the initial physical examination blood pressure 120/60, heart rate 54, respiratory rate 25,temperature 98, oxygen saturation 97%. Moist mucous membranes, lungs with bibasilar rhonchi, increased work of breathing, heart S1-S2 present, rhythmic, abdomen protuberant, positive pitting lower extremity edema +++,bilaterally. Sodium 142, potassium 4.5, chloride 99, bicarb 36, glucose 108, BUN 22, creatinine 1.56, BNP 507, white count 9.1, hemoglobin 8.2, hematocrit 29.2, platelets 322.Chest x-ray with increased interstitial markings bilaterally, left base atelectasis. EKG sinus rhythm, left axis deviation, left anterior fascicular block, right bundle branch block.  Patient was admitted to the hospital with a working diagnosis of acute decompensation of diastolic heart failure,complicated by symptomatic anemia.  Assessment & Plan:   Principal Problem:   Acute on chronic respiratory failure with hypercapnia (HCC) Active Problems:   Pulmonary hypertension (HCC)   Chronic anticoagulation   HTN (hypertension)   Acute on chronic diastolic heart failure (HCC)   Symptomatic anemia   Heart failure (HCC)  1. Acute on chronic diastolic heart failure decompensation with acute hypoxic respiratory failure due to  cardiogenic pulmonary edema (present on admission). On furosemide po 60 mg daily urine output over last 24 hours, 1,175 ml. Continue isosorbide and metoprolol. Clinically improved volume status.   2. COPD exacerbation complicated with pulmonary hypertension. Continue to have poor air movement with prolonged expiratory phase, and today expiratory wheezing, will do a trial of lev-albuterol and continue continue pratropium every 6 hours and budesonide. Systemic steroids with 40 mg of methylprednisolone IV. Will plan to repeat chest film in am if persistent dyspnea.   4. AKI on CKD stage 3. Normalized renal function with serum cr at 0.96 with K at 3,5 and serum bicarbonate at 42. Continue  po furosemide for now.   5. Chronic atrial fibrillation. Continue with amiodarone and metoprolol for rate control and anticoagulation with rivaroxaban.   6. HTN. Continue metoprolol and amlodipine,for blood pressure control.  7. Insomnia. Improved with increased dose of clonazepam to 1 mg at night.   DVT prophylaxis:rivaroxaban Code Status:dnr Family Communication: no family at the bedside  Disposition Plan/ discharge barriers:pending clinical improvement, will need SNF.   Consultants:    Procedures:    Antimicrobials:      Subjective: Patient has able to seep better, last night. Persistent dyspnea, worse with exertion, no significant cough. No nausea or vomiting. No chest pain.   Objective: Vitals:   01/27/18 2051 01/28/18 0000 01/28/18 0525 01/28/18 0940  BP:   (!) 128/41   Pulse:   64   Resp:   (!) 22   Temp:   (!) 97.5 F (36.4 C)   TempSrc:   Oral   SpO2: 94%  94% 92%  Weight:  90 kg (198 lb 8 oz)    Height:        Intake/Output Summary (Last 24 hours) at 01/28/2018 1046 Last data filed at 01/28/2018 0900 Gross per 24 hour  Intake 940 ml  Output 1175 ml  Net -235 ml   Filed Weights   01/26/18 0428 01/27/18 0415 01/28/18 0000  Weight: 90.8 kg (200 lb 1.6  oz) 89.6 kg (197 lb 9.6 oz) 90 kg (198 lb 8 oz)    Examination:   General: Not in pain or dyspnea, deconditioned Neurology: Awake and alert, non focal  E ENT: mild pallor, no icterus, oral mucosa moist Cardiovascular: No JVD. S1-S2 present, rhythmic, no gallops, rubs, or murmurs. Trace lower extremity edema. Pulmonary: decreased breath sounds bilaterally with prolonged expiratory pahse, decreased air movement, positive expiratory wheezing, with scattered rhonchi and rales. Gastrointestinal. Abdomen with no organomegaly, non tender, no rebound or guarding Skin. No rashes Musculoskeletal: no joint deformities     Data Reviewed: I have personally reviewed following labs and imaging studies  CBC: Recent Labs  Lab 01/25/18 0749 01/26/18 0516 01/27/18 0507 01/28/18 0651  WBC 9.1 11.4* 14.4* 9.4  NEUTROABS 6.3 8.9* 12.2* 8.2*  HGB 8.2* 10.5* 11.2* 10.8*  HCT 29.2* 34.9* 39.0 38.0  MCV 98.3 93.1 96.1 96.9  PLT 322 255 252 242   Basic Metabolic Panel: Recent Labs  Lab 01/25/18 0749 01/26/18 0516 01/27/18 0507 01/28/18 0651  NA 142 139 143 143  K 4.5 3.5 3.5 3.5  CL 99 95* 95* 93*  CO2 36* 38* 40* 42*  GLUCOSE 108* 112* 108* 123*  BUN 22 17 15 18   CREATININE 1.56* 1.49* 1.15* 0.96  CALCIUM 9.0 8.8* 9.0 9.8   GFR: Estimated Creatinine Clearance: 54.6 mL/min (by C-G formula based on SCr of 0.96 mg/dL). Liver Function Tests: Recent Labs  Lab 01/25/18 0749  AST 34  ALT 17  ALKPHOS 58  BILITOT 0.9  PROT 5.4*  ALBUMIN 2.9*   No results for input(s): LIPASE, AMYLASE in the last 168 hours. No results for input(s): AMMONIA in the last 168 hours. Coagulation Profile: No results for input(s): INR, PROTIME in the last 168 hours. Cardiac Enzymes: No results for input(s): CKTOTAL, CKMB, CKMBINDEX, TROPONINI in the last 168 hours. BNP (last 3 results) No results for input(s): PROBNP in the last 8760 hours. HbA1C: No results for input(s): HGBA1C in the last 72  hours. CBG: No results for input(s): GLUCAP in the last 168 hours. Lipid Profile: No results for input(s): CHOL, HDL, LDLCALC, TRIG, CHOLHDL, LDLDIRECT in the last 72 hours. Thyroid Function Tests: No results for input(s): TSH, T4TOTAL, FREET4, T3FREE, THYROIDAB in the last 72 hours. Anemia Panel: No results for input(s): VITAMINB12, FOLATE, FERRITIN, TIBC, IRON, RETICCTPCT in the last 72 hours.    Radiology Studies: I have reviewed all of the imaging during this hospital visit personally     Scheduled Meds: . sodium chloride   Intravenous Once  . amiodarone  200 mg Oral Daily  . budesonide  0.5 mg Nebulization BID  . clonazePAM  1 mg Oral QHS  . diclofenac sodium  2 g Topical QID  . furosemide  60 mg Oral Daily  . ipratropium  0.5 mg Nebulization QID  . isosorbide mononitrate  60 mg Oral q1800  . levalbuterol  1 puff Inhalation Q6H  . loratadine  10 mg Oral Daily  . methylPREDNISolone (SOLU-MEDROL) injection  40 mg Intravenous Daily  . metoprolol tartrate  50 mg Oral BID  . pantoprazole  40 mg Oral Daily  . Rivaroxaban  15 mg Oral Q supper  . simvastatin  60 mg Oral q1800  . sodium chloride flush  3 mL Intravenous Q12H   Continuous Infusions: . sodium chloride Stopped (01/27/18 0916)  LOS: 2 days        Tawni Millers, MD [facility] Pager [phone]

## 2018-01-29 ENCOUNTER — Ambulatory Visit (HOSPITAL_COMMUNITY): Admission: RE | Admit: 2018-01-29 | Payer: Medicare Other | Source: Ambulatory Visit

## 2018-01-29 LAB — BASIC METABOLIC PANEL
Anion gap: 4 — ABNORMAL LOW (ref 5–15)
BUN: 26 mg/dL — ABNORMAL HIGH (ref 8–23)
CHLORIDE: 97 mmol/L — AB (ref 98–111)
CO2: 40 mmol/L — ABNORMAL HIGH (ref 22–32)
CREATININE: 1.05 mg/dL — AB (ref 0.44–1.00)
Calcium: 9.2 mg/dL (ref 8.9–10.3)
GFR calc non Af Amer: 50 mL/min — ABNORMAL LOW (ref >60)
GFR, EST AFRICAN AMERICAN: 57 mL/min — AB (ref >60)
GLUCOSE: 122 mg/dL — AB (ref 70–99)
Potassium: 3.9 mmol/L (ref 3.5–5.1)
Sodium: 141 mmol/L (ref 135–145)

## 2018-01-29 MED ORDER — LEVALBUTEROL HCL 0.63 MG/3ML IN NEBU: 0.6300 mg | INHALATION_SOLUTION | Freq: Four times a day (QID) | RESPIRATORY_TRACT | Status: DC

## 2018-01-29 NOTE — Progress Notes (Signed)
Physical Therapy Treatment Patient Details Name: [name] MRN: [id number] DOB: Dec 11, 1940 Today's Date: 01/29/2018    History of Present Illness Pt is a 77 y.o. 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 She presented with SOB and weakness.  Pt with recent hospitalization, discharging to SNF 6/4. Pt reports she had a bad experience and only stayed 2 nights. Since that time, she has been home receiving HH services.     PT Comments    Pt with limited activity tolerance this session. Reports she had been in the chair and ambulated to the bathroom and was too fatigued to perform OOB mobility this session. Was agreeable to performing supine HEP. Pt easily winded and oxygen sats ranging from 89%-92% on 3L. Will continue to follow acutely to maximize functional mobility independence and safety.    Follow Up Recommendations  SNF     Equipment Recommendations  None recommended by PT    Recommendations for Other Services       Precautions / Restrictions Precautions Precautions: Fall;Other (comment) Precaution Comments: watch sats Restrictions Weight Bearing Restrictions: No    Mobility  Bed Mobility               General bed mobility comments: Pt refusing OOB mobility this session secondary to fatigue, but was agreeable to supine HEP.   Transfers                    Ambulation/Gait                 Stairs             Wheelchair Mobility    Modified Rankin (Stroke Patients Only)       Balance                                            Cognition Arousal/Alertness: Awake/alert Behavior During Therapy: WFL for tasks assessed/performed Overall Cognitive Status: Within Functional Limits for tasks assessed                                        Exercises General Exercises - Upper Extremity Shoulder Flexion:  AROM;Both;5 reps Elbow Flexion: AROM;Both;10 reps General Exercises - Lower Extremity Ankle Circles/Pumps: AROM;Both;10 reps;Supine Long Arc Quad: AROM;Both;5 reps Other Exercises Other Exercises: Coached and practiced about use of pursed lip breathing during exercise. Verbal cues for technique. Practiced X15 breaths.     General Comments General comments (skin integrity, edema, etc.): Oxygen sats ranged from 89%-92% on 3L of oxygen throughout.       Pertinent Vitals/Pain Pain Assessment: No/denies pain    Home Living                      Prior Function            PT Goals (current goals can now be found in the care plan section) Acute Rehab PT Goals Patient Stated Goal: get stronger and breathe better PT Goal Formulation: With patient Time For Goal Achievement: 02/09/18 Potential to Achieve Goals: Good Progress towards PT goals: Progressing toward goals(slow )    Frequency    Min 3X/week  PT Plan Current plan remains appropriate    Co-evaluation              AM-PAC PT "6 Clicks" Daily Activity  Outcome Measure  Difficulty turning over in bed (including adjusting bedclothes, sheets and blankets)?: A Lot Difficulty moving from lying on back to sitting on the side of the bed? : Unable Difficulty sitting down on and standing up from a chair with arms (e.g., wheelchair, bedside commode, etc,.)?: Unable Help needed moving to and from a bed to chair (including a wheelchair)?: A Little Help needed walking in hospital room?: A Little Help needed climbing 3-5 steps with a railing? : Total 6 Click Score: 11    End of Session Equipment Utilized During Treatment: Oxygen Activity Tolerance: Patient limited by fatigue Patient left: in bed;with call bell/phone within reach Nurse Communication: Mobility status PT Visit Diagnosis: Other abnormalities of gait and mobility (R26.89);Muscle weakness (generalized) (M62.81);Difficulty in walking, not elsewhere  classified (R26.2)     Time: 1610-9604 PT Time Calculation (min) (ACUTE ONLY): 12 min  Charges:  $Therapeutic Exercise: 8-22 mins                    G Codes:       [name], PT, DPT  Acute Rehabilitation Services  Pager: [phone]    [name] 01/29/2018, 2:15 PM

## 2018-01-29 NOTE — Clinical Social Work Note (Signed)
[facility] cannot offer patient a bed. Patient asked about [facility]. They have extended a bed offer and have a private room available. Admissions coordinator will start insurance authorization.  Charlynn Court, CSW [phone]

## 2018-01-29 NOTE — Progress Notes (Signed)
PROGRESS NOTE    [name]  VVK:[id number] DOB: 02-03-1941 DOA: 01/25/2018 PCP: Maurice Small, MD    Brief Narrative:  77 year old female who presented with dyspnea. She does have thesignificant past medical history for pulmonary hypertension, COPD with chronic hypoxic respiratory failure, hypertension, dyslipidemia, coronary artery disease status post bypass graft, diastolic heart failure and obstructive sleep apnea. Reported progressive dyspnea, associated with lower extremity edema and significant weight gain.Patient was diagnosed with anemia as an outpatient and was scheduled for outpatient PRBC transfusion. She presented to the emergency room due to worsening symptoms. On the initial physical examination blood pressure 120/60, heart rate 54, respiratory rate 25,temperature 98, oxygen saturation 97%. Moist mucous membranes, lungs with bibasilar rhonchi, increased work of breathing, heart S1-S2 present, rhythmic, abdomen protuberant, positive pitting lower extremity edema +++,bilaterally. Sodium 142, potassium 4.5, chloride 99, bicarb 36, glucose 108, BUN 22, creatinine 1.56, BNP 507, white count 9.1, hemoglobin 8.2, hematocrit 29.2, platelets 322.Chest x-ray with increased interstitial markings bilaterally, left base atelectasis. EKG sinus rhythm, left axis deviation, left anterior fascicular block, right bundle branch block.  Patient was admitted to the hospital with a working diagnosis of acute decompensation of diastolic heart failure,complicated by symptomatic anemia.   Assessment & Plan:   Principal Problem:   Acute on chronic respiratory failure with hypercapnia (HCC) Active Problems:   Pulmonary hypertension (HCC)   Chronic anticoagulation   HTN (hypertension)   Acute on chronic diastolic heart failure (HCC)   Symptomatic anemia   Heart failure (HCC)   1. Acute on chronic diastolic heart failure decompensation with acute hypoxic respiratory failure due to  cardiogenic pulmonary edema (present on admission). Continue diuresis with furosemide po 60 mg daily urine output over last 24 hours, 1,100 ml. On isosorbide and metoprolol for heart failure management.   2. COPDexacerbation complicated withpulmonary hypertension. Persistent dyspnea with poor air movement and prolonged expiratory phase, continue lev-albuterol, ipratropium every 6 hours and budesonide. On systemic steroids with 40 mg of methylprednisolone IV daily. Noted 0 eosinophils. Will get follow chest film today. Suspected slow to response to medical therapy, will follow chest film today.   4.AKI on CKD stage 3.Serum cr at 1,0 this am  with K at 3,9 and serum bicarbonate at 40. Continue current dose of po furosemide.   5. Chronic atrial fibrillation.On amiodarone and metoprololfor rate control andanticoagulation with rivaroxaban.   6. HTN.On metoprolol and amlodipine,for blood pressure control systolic blood pressure 118 mmHg.   7. Insomnia. Continue with clonazepam to 1 mg at night.  DVT prophylaxis:rivaroxaban Code Status:dnr Family Communication: no family at the bedside  Disposition Plan/ discharge barriers:pending clinical improvement, will need SNF.   Consultants:    Procedures:    Antimicrobials:     Subjective: Patient with persistent dyspnea, reported improved strength but very symptomatic on exertion. No nausea or vomiting, no chest pain. Persistent lower extremity edema.   Objective: Vitals:   01/29/18 0946 01/29/18 0954 01/29/18 1112 01/29/18 1131  BP: (!) 125/43   (!) 118/41  Pulse: (!) 58   (!) 56  Resp:    16  Temp:    (!) 97.5 F (36.4 C)  TempSrc:    Oral  SpO2:  95% 96% 100%  Weight:      Height:        Intake/Output Summary (Last 24 hours) at 01/29/2018 1233 Last data filed at 01/29/2018 1000 Gross per 24 hour  Intake 340 ml  Output 1600 ml  Net -1260 ml   Filed  Weights   01/27/18 0415 01/28/18 0000 01/29/18  0300  Weight: 89.6 kg (197 lb 9.6 oz) 90 kg (198 lb 8 oz) 90.4 kg (199 lb 6.4 oz)    Examination:   General: deconditioned and dyspneic.  Neurology: Awake and alert, non focal  E ENT: mild pallor, no icterus, oral mucosa moist Cardiovascular: No JVD. S1-S2 present, rhythmic, no gallops, rubs, or murmurs. ++ non pitting lower extremity edema. Pulmonary: positive breath sounds bilaterally, poor air movement, no wheezing or rhonchi, positive bibasilar rales. Gastrointestinal. Abdomen with no organomegaly, non tender, no rebound or guarding Skin. No rashes Musculoskeletal: no joint deformities     Data Reviewed: I have personally reviewed following labs and imaging studies  CBC: Recent Labs  Lab 01/25/18 0749 01/26/18 0516 01/27/18 0507 01/28/18 0651  WBC 9.1 11.4* 14.4* 9.4  NEUTROABS 6.3 8.9* 12.2* 8.2*  HGB 8.2* 10.5* 11.2* 10.8*  HCT 29.2* 34.9* 39.0 38.0  MCV 98.3 93.1 96.1 96.9  PLT 322 255 252 242   Basic Metabolic Panel: Recent Labs  Lab 01/25/18 0749 01/26/18 0516 01/27/18 0507 01/28/18 0651 01/29/18 0503  NA 142 139 143 143 141  K 4.5 3.5 3.5 3.5 3.9  CL 99 95* 95* 93* 97*  CO2 36* 38* 40* 42* 40*  GLUCOSE 108* 112* 108* 123* 122*  BUN 22 17 15 18  26*  CREATININE 1.56* 1.49* 1.15* 0.96 1.05*  CALCIUM 9.0 8.8* 9.0 9.8 9.2   GFR: Estimated Creatinine Clearance: 50 mL/min (A) (by C-G formula based on SCr of 1.05 mg/dL (H)). Liver Function Tests: Recent Labs  Lab 01/25/18 0749  AST 34  ALT 17  ALKPHOS 58  BILITOT 0.9  PROT 5.4*  ALBUMIN 2.9*   No results for input(s): LIPASE, AMYLASE in the last 168 hours. No results for input(s): AMMONIA in the last 168 hours. Coagulation Profile: No results for input(s): INR, PROTIME in the last 168 hours. Cardiac Enzymes: No results for input(s): CKTOTAL, CKMB, CKMBINDEX, TROPONINI in the last 168 hours. BNP (last 3 results) No results for input(s): PROBNP in the last 8760 hours. HbA1C: No results for  input(s): HGBA1C in the last 72 hours. CBG: No results for input(s): GLUCAP in the last 168 hours. Lipid Profile: No results for input(s): CHOL, HDL, LDLCALC, TRIG, CHOLHDL, LDLDIRECT in the last 72 hours. Thyroid Function Tests: No results for input(s): TSH, T4TOTAL, FREET4, T3FREE, THYROIDAB in the last 72 hours. Anemia Panel: No results for input(s): VITAMINB12, FOLATE, FERRITIN, TIBC, IRON, RETICCTPCT in the last 72 hours.    Radiology Studies: I have reviewed all of the imaging during this hospital visit personally     Scheduled Meds: . sodium chloride   Intravenous Once  . amiodarone  200 mg Oral Daily  . budesonide  0.5 mg Nebulization BID  . clonazePAM  1 mg Oral QHS  . diclofenac sodium  2 g Topical QID  . furosemide  60 mg Oral Daily  . ipratropium  0.5 mg Nebulization QID  . isosorbide mononitrate  60 mg Oral q1800  . loratadine  10 mg Oral Daily  . methylPREDNISolone (SOLU-MEDROL) injection  40 mg Intravenous Daily  . metoprolol tartrate  50 mg Oral BID  . pantoprazole  40 mg Oral Daily  . Rivaroxaban  15 mg Oral Q supper  . simvastatin  60 mg Oral q1800  . sodium chloride flush  3 mL Intravenous Q12H   Continuous Infusions: . sodium chloride Stopped (01/27/18 0916)     LOS: 3 days  Ayoub Arey Gerome Apley, MD [facility] Pager [phone]

## 2018-01-29 NOTE — Plan of Care (Signed)
  Problem: Health Behavior/Discharge Planning: Goal: Ability to manage health-related needs will improve Outcome: Progressing   Problem: Clinical Measurements: Goal: Ability to maintain clinical measurements within normal limits will improve Outcome: Progressing   

## 2018-01-30 ENCOUNTER — Inpatient Hospital Stay (HOSPITAL_COMMUNITY): Payer: Medicare Other

## 2018-01-30 ENCOUNTER — Other Ambulatory Visit (HOSPITAL_COMMUNITY): Payer: Medicare Other

## 2018-01-30 DIAGNOSIS — I361 Nonrheumatic tricuspid (valve) insufficiency: Secondary | ICD-10-CM

## 2018-01-30 LAB — BASIC METABOLIC PANEL
Anion gap: 10 (ref 5–15)
BUN: 27 mg/dL — AB (ref 8–23)
CO2: 40 mmol/L — ABNORMAL HIGH (ref 22–32)
Calcium: 9.4 mg/dL (ref 8.9–10.3)
Chloride: 95 mmol/L — ABNORMAL LOW (ref 98–111)
Creatinine, Ser: 1.04 mg/dL — ABNORMAL HIGH (ref 0.44–1.00)
GFR calc Af Amer: 58 mL/min — ABNORMAL LOW (ref >60)
GFR calc non Af Amer: 50 mL/min — ABNORMAL LOW (ref >60)
GLUCOSE: 104 mg/dL — AB (ref 70–99)
Potassium: 3.6 mmol/L (ref 3.5–5.1)
SODIUM: 145 mmol/L (ref 135–145)

## 2018-01-30 LAB — ECHOCARDIOGRAM COMPLETE
Height: 66 in
Weight: 3179.2 oz

## 2018-01-30 MED ORDER — IOHEXOL 300 MG/ML  SOLN
100.0000 mL | Freq: Once | INTRAMUSCULAR | Status: AC | PRN
  Administered 2018-01-30: 100 mL via INTRAVENOUS

## 2018-01-30 MED ORDER — METHYLPREDNISOLONE SODIUM SUCC 40 MG IJ SOLR
20.0000 mg | Freq: Every day | INTRAMUSCULAR | Status: DC
  Administered 2018-01-31: 20 mg via INTRAVENOUS
  Filled 2018-01-30: qty 1

## 2018-01-30 NOTE — Progress Notes (Signed)
Nutrition Brief Note  RD consulted for assessment of nutritional status/ needs.   Wt Readings from Last 15 Encounters:  01/30/18 198 lb 11.2 oz (90.1 kg)  01/18/18 194 lb 12.8 oz (88.4 kg)  01/12/18 198 lb 6.4 oz (90 kg)  12/26/17 198 lb 8 oz (90 kg)  11/29/17 193 lb (87.5 kg)  11/21/17 197 lb 8 oz (89.6 kg)  11/07/17 196 lb (88.9 kg)  10/19/17 194 lb (88 kg)  09/26/17 196 lb (88.9 kg)  09/05/17 199 lb 12.8 oz (90.6 kg)  06/26/17 189 lb 12.8 oz (86.1 kg)  01/30/17 187 lb (84.8 kg)  10/26/16 186 lb (84.4 kg)  10/21/16 185 lb 12 oz (84.3 kg)  08/24/16 188 lb 3.2 oz (85.4 kg)   [name] is a 77 y.o. 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  Pt admitted with acute respiratory failure related to COPD.   Spoke with pt at bedside, who reports great appetite. She consumed all of her lunch tray, other than dessert at time of visit. She shares she usually consumes 3 meals per day (Breakfast: grits, eggs, and bacon/sausage; Lunch: sandwich; Dinner: meat and potatoes). She reports frustration about not being able to receive salt or breakfast meat on her meal trays; discussed rationale for low sodium diet.  Pt denies any weight loss. Reviewed wt hx; wt has been stable. Suspect slight wt fluctuations related to fluid retention.   Nutrition-Focused physical exam completed. Findings are no fat depletion, no muscle depletion, and mild edema.   Discussed importance of good meal intake to promote healing. Pt reports she will likely d/c to SNF at discharge.  Albumin has a half-life of 21 days and is strongly affected by stress response and inflammatory process, therefore, do not expect to see an improvement in this lab value during acute hospitalization. When a patient presents with low albumin, it is likely skewed due to the acute inflammatory response.  Unless it is suspected  that patient had poor PO intake or malnutrition prior to admission, then RD should not be consulted solely for low albumin. Note that low albumin is no longer used to diagnose malnutrition; Mark uses the new malnutrition guidelines published by the American Society for Parenteral and Enteral Nutrition (A.S.P.E.N.) and the Academy of Nutrition and Dietetics (AND).    Body mass index is 32.07 kg/m. Patient meets criteria for obesity, class I based on current BMI.   Current diet order is Heart Healthy with 1200 ml fluid restriction, patient is consuming approximately 100% of meals at this time. Labs and medications reviewed.   No nutrition interventions warranted at this time. If nutrition issues arise, please consult RD.   Onur Mori A. Mayford Knife, RD, LDN, CDE Pager: [phone] After hours Pager: [phone]

## 2018-01-30 NOTE — Clinical Social Work Note (Signed)
RN notified CSW that son wants patient to return home at discharge because he did not like bed offers. Insurance authorization approved for [facility]. CSW notified patient and she is still agreeable to going there once stable for discharge. Patient stated she will have to talk with her son about her concerns with returning home before she is strong enough. She states her husband is in an electric wheelchair and is unable to help her. He cannot even get through their bedroom door. CSW paged MD to notify him that patient is still agreeable to SNF.  Charlynn Court, CSW [phone]

## 2018-01-30 NOTE — Progress Notes (Signed)
  Echocardiogram 2D Echocardiogram has been performed.  [name] 01/30/2018, 4:43 PM

## 2018-01-30 NOTE — Plan of Care (Signed)
  Problem: Health Behavior/Discharge Planning: Goal: Ability to manage health-related needs will improve Outcome: Progressing   Problem: Clinical Measurements: Goal: Ability to maintain clinical measurements within normal limits will improve Outcome: Progressing Goal: Will remain free from infection Outcome: Progressing Goal: Diagnostic test results will improve Outcome: Progressing Goal: Respiratory complications will improve Outcome: Progressing Goal: Cardiovascular complication will be avoided Outcome: Progressing   Problem: Activity: Goal: Risk for activity intolerance will decrease Outcome: Progressing   Problem: Activity: Goal: Risk for activity intolerance will decrease Outcome: Progressing

## 2018-01-30 NOTE — Progress Notes (Signed)
PROGRESS NOTE    [name]  UJW:[id number] DOB: 03-Aug-1940 DOA: 01/25/2018 PCP: Maurice Small, MD    Brief Narrative:  77 year old female who presented with dyspnea. She does have thesignificant past medical history for pulmonary hypertension, COPD with chronic hypoxic respiratory failure, hypertension, dyslipidemia, coronary artery disease status post bypass graft, diastolic heart failure and obstructive sleep apnea. Reported progressive dyspnea, associated with lower extremity edema and significant weight gain.Patient was diagnosed with anemia as an outpatient and was scheduled for outpatient PRBC transfusion. She presented to the emergency room due to worsening symptoms. On the initial physical examination blood pressure 120/60, heart rate 54, respiratory rate 25,temperature 98, oxygen saturation 97%. Moist mucous membranes, lungs with bibasilar rhonchi, increased work of breathing, heart S1-S2 present, rhythmic, abdomen protuberant, positive pitting lower extremity edema +++,bilaterally. Sodium 142, potassium 4.5, chloride 99, bicarb 36, glucose 108, BUN 22, creatinine 1.56, BNP 507, white count 9.1, hemoglobin 8.2, hematocrit 29.2, platelets 322.Chest x-ray with increased interstitial markings bilaterally, left base atelectasis. EKG sinus rhythm, left axis deviation, left anterior fascicular block, right bundle branch block.  Patient was admitted to the hospital with a working diagnosis of acute decompensation of diastolic heart failure,complicated by symptomatic anemia.   Assessment & Plan:   Principal Problem:   Acute on chronic respiratory failure with hypercapnia (HCC) Active Problems:   Pulmonary hypertension (HCC)   Chronic anticoagulation   HTN (hypertension)   Acute on chronic diastolic heart failure (HCC)   Symptomatic anemia   Heart failure (HCC)   1. Acute on chronic diastolic heart failure decompensation with acute hypoxic respiratory failure due to  cardiogenic pulmonary edema (present on admission).On diuresis with furosemide po60 mgdaily urine output over last 24 hours, 1,950 ml. Will add fluid restriction to improve negative fluid balance, continue withisosorbide and metoprolol for heart failure management. Preserved LV function in echocardiogram from 02/19.   2. COPDexacerbation complicated withpulmonary hypertension, with suspected pulmonary hypertension. Patient reports persistent dyspnea, worse with exertion, low serum eosinophils, will taper down systemic steroids, will check echocardiogram to measure PA pressure, and will check CT chest to follow on lung parenchymal changes and rule out PE. Nutrition consult and physical therapy, patient will need SNF at discharge. Continue bronchodilator therapy. Note patient is no amiodarone, that can cause ILD.   4.AKIon CKD stage 3.Stable renal function with serum cr at 1,0, with K at 3,6 and serum bicarbonate at 40, will continue po furosemide and will add fluid restrictions 1200 ml per day.   5. Chronic atrial fibrillation.Continue amiodarone and metoprololfor rate controlandanticoagulation with rivaroxaban.   6. HTN.Continue metoprolol and amlodipine, blood pressure systolic 123 mmHG.   7. Insomnia.Tolerating well clonazepam to 1 mg at night.  DVT prophylaxis:rivaroxaban Code Status:dnr Family Communication:no family at the bedside  Disposition Plan/ discharge barriers:pending clinicalimprovement, will need SNF.  Consultants:    Procedures:    Antimicrobials:  Subjective: Patient continue to have dyspnea, worse with exertion, no improving factors, associated with generalized weakness, no chest pain, stable lower extremity edema.   Objective: Vitals:   01/30/18 0753 01/30/18 0908 01/30/18 1142 01/30/18 1144  BP:  110/65  123/74  Pulse:  60  61  Resp:  18    Temp:  97.6 F (36.4 C)  (!) 97.5 F (36.4 C)  TempSrc:  Oral  Oral  SpO2:  96% 95% 94% 92%  Weight:      Height:        Intake/Output Summary (Last 24 hours) at 01/30/2018 1246 Last data  filed at 01/30/2018 1147 Gross per 24 hour  Intake 1745 ml  Output 1460 ml  Net 285 ml   Filed Weights   01/28/18 0000 01/29/18 0300 01/30/18 0455  Weight: 90 kg (198 lb 8 oz) 90.4 kg (199 lb 6.4 oz) 90.1 kg (198 lb 11.2 oz)    Examination:   General: positive dyspnea, deconditioned  Neurology: Awake and alert, non focal  E ENT: positive pallor, no icterus, oral mucosa moist Cardiovascular: No JVD. S1-S2 present, rhythmic, no gallops, rubs, or murmurs. ++ bilateral lower extremity edema. Pulmonary: Positive breath sounds bilaterally, no wheezing, or rhonchi, positive bibasilar rales. Gastrointestinal. Abdomen protuberant, no organomegaly, non tender, no rebound or guarding Skin. No rashes Musculoskeletal: no joint deformities     Data Reviewed: I have personally reviewed following labs and imaging studies  CBC: Recent Labs  Lab 01/25/18 0749 01/26/18 0516 01/27/18 0507 01/28/18 0651  WBC 9.1 11.4* 14.4* 9.4  NEUTROABS 6.3 8.9* 12.2* 8.2*  HGB 8.2* 10.5* 11.2* 10.8*  HCT 29.2* 34.9* 39.0 38.0  MCV 98.3 93.1 96.1 96.9  PLT 322 255 252 242   Basic Metabolic Panel: Recent Labs  Lab 01/26/18 0516 01/27/18 0507 01/28/18 0651 01/29/18 0503 01/30/18 0608  NA 139 143 143 141 145  K 3.5 3.5 3.5 3.9 3.6  CL 95* 95* 93* 97* 95*  CO2 38* 40* 42* 40* 40*  GLUCOSE 112* 108* 123* 122* 104*  BUN 17 15 18  26* 27*  CREATININE 1.49* 1.15* 0.96 1.05* 1.04*  CALCIUM 8.8* 9.0 9.8 9.2 9.4   GFR: Estimated Creatinine Clearance: 50.4 mL/min (A) (by C-G formula based on SCr of 1.04 mg/dL (H)). Liver Function Tests: Recent Labs  Lab 01/25/18 0749  AST 34  ALT 17  ALKPHOS 58  BILITOT 0.9  PROT 5.4*  ALBUMIN 2.9*   No results for input(s): LIPASE, AMYLASE in the last 168 hours. No results for input(s): AMMONIA in the last 168 hours. Coagulation Profile: No  results for input(s): INR, PROTIME in the last 168 hours. Cardiac Enzymes: No results for input(s): CKTOTAL, CKMB, CKMBINDEX, TROPONINI in the last 168 hours. BNP (last 3 results) No results for input(s): PROBNP in the last 8760 hours. HbA1C: No results for input(s): HGBA1C in the last 72 hours. CBG: No results for input(s): GLUCAP in the last 168 hours. Lipid Profile: No results for input(s): CHOL, HDL, LDLCALC, TRIG, CHOLHDL, LDLDIRECT in the last 72 hours. Thyroid Function Tests: No results for input(s): TSH, T4TOTAL, FREET4, T3FREE, THYROIDAB in the last 72 hours. Anemia Panel: No results for input(s): VITAMINB12, FOLATE, FERRITIN, TIBC, IRON, RETICCTPCT in the last 72 hours.    Radiology Studies: I have reviewed all of the imaging during this hospital visit personally     Scheduled Meds: . sodium chloride   Intravenous Once  . amiodarone  200 mg Oral Daily  . budesonide  0.5 mg Nebulization BID  . clonazePAM  1 mg Oral QHS  . diclofenac sodium  2 g Topical QID  . furosemide  60 mg Oral Daily  . ipratropium  0.5 mg Nebulization QID  . isosorbide mononitrate  60 mg Oral q1800  . loratadine  10 mg Oral Daily  . methylPREDNISolone (SOLU-MEDROL) injection  40 mg Intravenous Daily  . metoprolol tartrate  50 mg Oral BID  . pantoprazole  40 mg Oral Daily  . Rivaroxaban  15 mg Oral Q supper  . simvastatin  60 mg Oral q1800  . sodium chloride flush  3 mL Intravenous Q12H  Continuous Infusions: . sodium chloride Stopped (01/27/18 0916)     LOS: 4 days        Hortencia Martire Annett Gula, MD [facility] Pager [phone]

## 2018-01-30 NOTE — Progress Notes (Signed)
Patient still with labored breathing upon minimal exertion.

## 2018-01-31 ENCOUNTER — Encounter (HOSPITAL_COMMUNITY): Payer: Medicare Other

## 2018-01-31 DIAGNOSIS — J9622 Acute and chronic respiratory failure with hypercapnia: Secondary | ICD-10-CM

## 2018-01-31 DIAGNOSIS — I509 Heart failure, unspecified: Secondary | ICD-10-CM

## 2018-01-31 DIAGNOSIS — I272 Pulmonary hypertension, unspecified: Secondary | ICD-10-CM

## 2018-01-31 DIAGNOSIS — D649 Anemia, unspecified: Secondary | ICD-10-CM

## 2018-01-31 DIAGNOSIS — Z7901 Long term (current) use of anticoagulants: Secondary | ICD-10-CM

## 2018-01-31 LAB — BASIC METABOLIC PANEL
Anion gap: 5 (ref 5–15)
BUN: 27 mg/dL — AB (ref 8–23)
CHLORIDE: 99 mmol/L (ref 98–111)
CO2: 42 mmol/L — ABNORMAL HIGH (ref 22–32)
CREATININE: 0.87 mg/dL (ref 0.44–1.00)
Calcium: 9.5 mg/dL (ref 8.9–10.3)
GFR calc Af Amer: >60 mL/min (ref >60)
GFR calc non Af Amer: >60 mL/min (ref >60)
Glucose, Bld: 122 mg/dL — ABNORMAL HIGH (ref 70–99)
Potassium: 4.3 mmol/L (ref 3.5–5.1)
SODIUM: 146 mmol/L — AB (ref 135–145)

## 2018-01-31 MED ORDER — METHYLPREDNISOLONE SODIUM SUCC 40 MG IJ SOLR
40.0000 mg | Freq: Four times a day (QID) | INTRAMUSCULAR | Status: DC
  Administered 2018-02-01 (×3): 40 mg via INTRAVENOUS
  Filled 2018-01-31 (×3): qty 1

## 2018-01-31 NOTE — Progress Notes (Signed)
Patient has had uneventful day. She has been up in the recliner chair most of the day and tolerated well. Transferred to [facility] when necessary. Worked with PT with good results.

## 2018-01-31 NOTE — Plan of Care (Signed)
  Problem: Activity: Goal: Risk for activity intolerance will decrease Outcome: Progressing   Problem: Safety: Goal: Ability to remain free from injury will improve Outcome: Progressing   Problem: Activity: Goal: Capacity to carry out activities will improve Outcome: Progressing   Problem: Activity: Goal: Ability to tolerate increased activity will improve Outcome: Progressing

## 2018-01-31 NOTE — Progress Notes (Signed)
Physical Therapy Treatment Patient Details Name: [name] MRN: [id number] DOB: 15-Mar-1941 Today's Date: 01/31/2018    History of Present Illness Pt is a 77 y.o. 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 She presented with SOB and weakness.  Pt with recent hospitalization, discharging to SNF 6/4. Pt reports she had a bad experience and only stayed 2 nights. Since that time, she has been home receiving HH services.     PT Comments    Continuing work on functional mobility and activity tolerance;  Better success with O2 sat monitoring with use of hot pack to warm fingers prior to amb; continue to agree with SNF for post-acute rehab    Follow Up Recommendations  SNF     Equipment Recommendations  None recommended by PT    Recommendations for Other Services       Precautions / Restrictions Precautions Precautions: Fall;Other (comment) Precaution Comments: watch sats Restrictions Weight Bearing Restrictions: No    Mobility  Bed Mobility Overal bed mobility: Needs Assistance Bed Mobility: Sit to Supine       Sit to supine: HOB elevated;Min assist   General bed mobility comments: min assist for LEs  Transfers Overall transfer level: Needs assistance Equipment used: Rolling walker (2 wheeled) Transfers: Sit to/from Stand Sit to Stand: Min assist;Mod assist         General transfer comment: min A to steady, increased time and effort; mod assist to power up from low commode  Ambulation/Gait Ambulation/Gait assistance: Min guard Gait Distance (Feet): 80 Feet Assistive device: Rolling walker (2 wheeled) Gait Pattern/deviations: Step-through pattern;Decreased step length - right;Decreased step length - left;Trunk flexed Gait velocity: decreased   General Gait Details: Close min guard secondary to SOB. Initiated amb on 4 L and tried to give non-verbal demonstrational cues  for controlled pursed lip breathing; O2 sats ranged 92-99% (used hot pack to warm pt's fingers prior to walking)   Stairs             Wheelchair Mobility    Modified Rankin (Stroke Patients Only)       Balance     Sitting balance-Leahy Scale: Fair       Standing balance-Leahy Scale: Poor                              Cognition Arousal/Alertness: Awake/alert Behavior During Therapy: WFL for tasks assessed/performed Overall Cognitive Status: Within Functional Limits for tasks assessed                                        Exercises      General Comments        Pertinent Vitals/Pain Pain Assessment: Faces Faces Pain Scale: Hurts a little bit Pain Location: low back pain Pain Descriptors / Indicators: Aching Pain Intervention(s): Monitored during session    Home Living                      Prior Function            PT Goals (current goals can now be found in the care plan section) Acute Rehab PT Goals Patient Stated Goal: get stronger and breathe better PT Goal Formulation: With patient Time For Goal Achievement: 02/09/18 Potential  to Achieve Goals: Good Progress towards PT goals: Progressing toward goals    Frequency    Min 3X/week      PT Plan Current plan remains appropriate    Co-evaluation              AM-PAC PT "6 Clicks" Daily Activity  Outcome Measure  Difficulty turning over in bed (including adjusting bedclothes, sheets and blankets)?: A Lot Difficulty moving from lying on back to sitting on the side of the bed? : A Lot Difficulty sitting down on and standing up from a chair with arms (e.g., wheelchair, bedside commode, etc,.)?: Unable Help needed moving to and from a bed to chair (including a wheelchair)?: A Little Help needed walking in hospital room?: A Little Help needed climbing 3-5 steps with a railing? : Total 6 Click Score: 12    End of Session Equipment Utilized During  Treatment: Gait belt;Oxygen Activity Tolerance: Patient tolerated treatment well Patient left: in bed;with call bell/phone within reach;with bed alarm set Nurse Communication: Mobility status PT Visit Diagnosis: Other abnormalities of gait and mobility (R26.89);Muscle weakness (generalized) (M62.81);Difficulty in walking, not elsewhere classified (R26.2)     Time: 1521-1600 PT Time Calculation (min) (ACUTE ONLY): 39 min  Charges:  $Gait Training: 23-37 mins $Therapeutic Activity: 8-22 mins                    G Codes:       Van Clines, PT  Acute Rehabilitation Services Pager [phone] Office [phone]    Levi Aland 01/31/2018, 4:31 PM

## 2018-01-31 NOTE — Plan of Care (Signed)
  Problem: Activity: Goal: Risk for activity intolerance will decrease Outcome: Progressing   Problem: Safety: Goal: Ability to remain free from injury will improve Outcome: Progressing   

## 2018-01-31 NOTE — Progress Notes (Signed)
[facility]  PROGRESS NOTE  [name] OZH:[id number] DOB: 1940-11-02 DOA: 01/25/2018 PCP: Maurice Small, MD   Brief HPI:   77 year old female came with dyspnea, significant history of pulmonary hypertension, COPD, chronic hypoxic respiratory failure, hypertension, dyslipidemia, CAD status post bypass graft, diastolic heart failure, OSA.    Subjective   Patient seen and examined, denies chest pain.  Mild dyspnea on exertion.   Assessment/Plan:     1. Acute on chronic diastolic CHF-improved with diuresis with furosemide 60 mg p.o. daily.  Continue isosorbide, metoprolol.  Preserved LV function and echocardiogram from February 2019.  Net -2.5 L 2. COPD exacerbation/pulmonary hypertension-patient on Solu-Medrol 20 mg IV daily, will change to 40 mg IV every 6 hours. Continue ipratropium nebulizer every 6 hours 3. 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  No change from previous CT from July 2018 will consult pulmonology for further recommendations. 4. Acute kidney injury on CKD stage III-stable renal function with serum creatinine patient is on p.o. Lasix.  Follow BMP in am.  1.00.87. 5. Chronic atrial fibrillation-continue amiodarone, metoprolol.  Anticoagulation with Xarelto. 6. Hypertension-blood pressure is stable, continue metoprolol, amlodipine.      DVT prophylaxis: Xarelto  Code Status: DNR  Family Communication: No family at bedside  Disposition Plan: likely  skilled nursing facility   Consultants:  None  Procedures:  None appears in no acute distress   Antibiotics:   Anti-infectives (From admission, onward)   None       Objective   Vitals:   01/31/18 0809 01/31/18 1144 01/31/18 1203 01/31/18 1506  BP:   (!) 134/47   Pulse:   (!) 52   Resp:   20   Temp:   (!) 97.5 F (36.4 C)   TempSrc:   Oral   SpO2: 92% 96% 98% 90%  Weight:      Height:        Intake/Output Summary (Last 24 hours) at 01/31/2018 1804 Last data filed at  01/31/2018 1554 Gross per 24 hour  Intake 1590 ml  Output 1225 ml  Net 365 ml   Filed Weights   01/29/18 0300 01/30/18 0455 01/31/18 0531  Weight: 90.4 kg (199 lb 6.4 oz) 90.1 kg (198 lb 11.2 oz) 90 kg (198 lb 8 oz)     Physical Examination:    General: Appears in no acute distress  Cardiovascular: S1-S2, regular  Respiratory: Clear to auscultation bilaterally, no wheezing or crackles  Abdomen:  soft, nontender, no organomegaly  Extremities:  bilateral 1+ pitting edema of the lower extremities  Neurologic: Alert, oriented x3, no focal deficit     Data Reviewed: I have personally reviewed following labs and imaging studies  CBG: No results for input(s): GLUCAP in the last 168 hours.  CBC: Recent Labs  Lab 01/25/18 0749 01/26/18 0516 01/27/18 0507 01/28/18 0651  WBC 9.1 11.4* 14.4* 9.4  NEUTROABS 6.3 8.9* 12.2* 8.2*  HGB 8.2* 10.5* 11.2* 10.8*  HCT 29.2* 34.9* 39.0 38.0  MCV 98.3 93.1 96.1 96.9  PLT 322 255 252 242    Basic Metabolic Panel: Recent Labs  Lab 01/27/18 0507 01/28/18 0651 01/29/18 0503 01/30/18 0608 01/31/18 0607  NA 143 143 141 145 146*  K 3.5 3.5 3.9 3.6 4.3  CL 95* 93* 97* 95* 99  CO2 40* 42* 40* 40* 42*  GLUCOSE 108* 123* 122* 104* 122*  BUN 15 18 26* 27* 27*  CREATININE 1.15* 0.96 1.05* 1.04* 0.87  CALCIUM 9.0 9.8 9.2 9.4 9.5  No results found for this or any previous visit (from the past 240 hour(s)).   Liver Function Tests: Recent Labs  Lab 01/25/18 0749  AST 34  ALT 17  ALKPHOS 58  BILITOT 0.9  PROT 5.4*  ALBUMIN 2.9*   No results for input(s): LIPASE, AMYLASE in the last 168 hours. No results for input(s): AMMONIA in the last 168 hours.  Cardiac Enzymes: No results for input(s): CKTOTAL, CKMB, CKMBINDEX, TROPONINI in the last 168 hours. BNP (last 3 results) Recent Labs    12/13/17 1153 01/18/18 1341 01/25/18 0749  BNP 145.7* 254.3* 507.3*    ProBNP (last 3 results) No results for input(s): PROBNP in  the last 8760 hours.    Studies: Ct Chest W Contrast  Result Date: 01/30/2018 CLINICAL DATA:  77 year old with recent progressive shortness of breath associated with BILATERAL LOWER extremity edema and weight gain. Current history of pulmonary hypertension, COPD with chronic hypoxic respiratory failure, hypertension, diastolic heart failure and coronary artery disease with prior CABG. EXAM: CT CHEST WITH CONTRAST TECHNIQUE: Multidetector CT imaging of the chest was performed during intravenous contrast administration. CONTRAST:  OMNIPAQUE IOHEXOL 300 MG/ML IV. COMPARISON:  CT chest 11/01/2017, 01/23/2017. Chest x-rays 01/25/2018 and earlier. FINDINGS: Cardiovascular: Heart markedly enlarged with biatrial enlargement in particular. Prior CABG. The LAD, RIGHT coronary and LIMA grafts appear patent, though there is dystrophic calcification involving the proximal LAD graft. No pericardial effusion. Severe atherosclerosis involving the thoracic and proximal abdominal aorta without evidence of aneurysm, maximum ascending thoracic aortic diameter 3.7 cm. Atherosclerosis involving the proximal great vessels with borderline stenosis of the origin of the LEFT common carotid artery. Central pulmonary arteries patent. Calcification involving the wall of the LEFT lower lobe pulmonary artery is again noted. Mediastinum/Nodes: Numerous normal-sized lymph nodes throughout the mediastinum and in both hila. No pathologically enlarged mediastinal, hilar or axillary lymph nodes. No mediastinal masses. Normal-appearing esophagus. Prior RIGHT hemithyroidectomy. Multiple small nodules involving the residual LEFT lobe, unchanged, without evidence of a dominant nodule. Lungs/Pleura: The previously identified ground-glass nodule involving the LEFT lung apex currently has a more solid appearance currently, measuring approximately 1.1 x 1.0 cm, not significantly changed in size since the 01/23/2017 CT where it measured  approximately 1.0 x 0.9 cm. The pleural based 4 mm nodule in the POSTERIOR RIGHT apex is unchanged. Diffuse interstitial pulmonary edema is present. Linear atelectasis is present in the RIGHT lower lobe. Atelectasis superimposed upon scarring is present in the LEFT lower lobe. No pleural effusions. Central airways patent without significant bronchial wall thickening. Upper Abdomen: Enlargement of the LEFT lobe of the liver relative to the RIGHT lobe, with associated enlargement of the caudate lobe. No focal parenchymal abnormality within the visualized liver. Visualized upper abdomen otherwise unremarkable. Musculoskeletal: Degenerative disc disease and spondylosis throughout the thoracic spine. Mild compression fracture of the UPPER endplate of T12 and the LOWER endplate of T5 which are unchanged. No acute osseous findings. IMPRESSION: 1. Mild CHF, with evidence of mild diffuse interstitial pulmonary edema. 2. BILATERAL lower lobe atelectasis. 3. LEFT apical lung nodule, previously ground-glass, now has a more solid appearance, though is not significantly changed in size since the CT in July, 2018. Adenocarcinoma is considered. Thoracic surgery consultation is recommended. PET/CT should be considered for staging purposes. These recommendations are taken from: Recommendations for the Management of Subsolid Pulmonary Nodules Detected at CT: A Statement from the Fleischner Society Radiology 2013; 266:1, 304-317. 4. Calcification involving the wall of the LEFT lower lobe pulmonary artery indicates  pulmonary arterial hypertension. 5. Hepatic cirrhosis is suspected, as there is relative enlargement of the LEFT lobe compared to the RIGHT lobe. Aortic Atherosclerosis (ICD10-I70.0). Electronically Signed   By: Hulan Saas M.D.   On: 01/30/2018 17:11    Scheduled Meds: . sodium chloride   Intravenous Once  . amiodarone  200 mg Oral Daily  . budesonide  0.5 mg Nebulization BID  . clonazePAM  1 mg Oral QHS  .  diclofenac sodium  2 g Topical QID  . furosemide  60 mg Oral Daily  . ipratropium  0.5 mg Nebulization QID  . isosorbide mononitrate  60 mg Oral q1800  . loratadine  10 mg Oral Daily  . methylPREDNISolone (SOLU-MEDROL) injection  20 mg Intravenous Daily  . metoprolol tartrate  50 mg Oral BID  . pantoprazole  40 mg Oral Daily  . Rivaroxaban  15 mg Oral Q supper  . simvastatin  60 mg Oral q1800  . sodium chloride flush  3 mL Intravenous Q12H      Time spent: 25 min  Meredeth Ide   [facility] Pager [phone]. If 7PM-7AM, please contact night-coverage at [email or web address], Office  [phone]  password [email or web address]  01/31/2018, 6:04 PM  LOS: 5 days

## 2018-02-01 DIAGNOSIS — I10 Essential (primary) hypertension: Secondary | ICD-10-CM

## 2018-02-01 LAB — BASIC METABOLIC PANEL
ANION GAP: 11 (ref 5–15)
BUN: 30 mg/dL — ABNORMAL HIGH (ref 8–23)
CHLORIDE: 95 mmol/L — AB (ref 98–111)
CO2: 39 mmol/L — ABNORMAL HIGH (ref 22–32)
Calcium: 9.4 mg/dL (ref 8.9–10.3)
Creatinine, Ser: 1.1 mg/dL — ABNORMAL HIGH (ref 0.44–1.00)
GFR calc Af Amer: 54 mL/min — ABNORMAL LOW (ref >60)
GFR calc non Af Amer: 47 mL/min — ABNORMAL LOW (ref >60)
GLUCOSE: 195 mg/dL — AB (ref 70–99)
Potassium: 4.4 mmol/L (ref 3.5–5.1)
Sodium: 145 mmol/L (ref 135–145)

## 2018-02-01 MED ORDER — CLONAZEPAM 0.5 MG PO TABS: 0.5000 mg | ORAL_TABLET | Freq: Every day | ORAL | 0 refills | Status: AC

## 2018-02-01 MED ORDER — PREDNISONE 10 MG PO TABS: ORAL_TABLET | ORAL | 0 refills | Status: DC

## 2018-02-01 MED ORDER — DICLOFENAC SODIUM 1 % TD GEL: 2.0000 g | Freq: Every day | TRANSDERMAL | Status: AC | PRN

## 2018-02-01 MED ORDER — BUDESONIDE 0.5 MG/2ML IN SUSP: 0.5000 mg | Freq: Two times a day (BID) | RESPIRATORY_TRACT | 12 refills | Status: DC

## 2018-02-01 NOTE — Care Management Important Message (Signed)
Important Message  Patient Details  Name: [name] MRN: [id number] Date of Birth: 1940/12/25   Medicare Important Message Given:  Yes    [name] [name] 02/01/2018, 2:54 PM

## 2018-02-01 NOTE — Progress Notes (Signed)
Physical Therapy Treatment Patient Details Name: [name] MRN: [id number] DOB: 1940/08/12 Today's Date: 02/01/2018    History of Present Illness Pt is a 77 y.o. 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 She presented with SOB and weakness.  Pt with recent hospitalization, discharging to SNF 6/4. Pt reports she had a bad experience and only stayed 2 nights. Since that time, she has been home receiving HH services.     PT Comments    Patient progressing with therapy today, requiring less physical assistance to stand and ambulate. Pt increased distance by 40' today. Now min A level for mobility.  Pt d/cing to SNF this afternoon, feel this is appropriate.   Follow Up Recommendations  SNF     Equipment Recommendations  None recommended by PT    Recommendations for Other Services       Precautions / Restrictions Precautions Precaution Comments: watch sats Restrictions Weight Bearing Restrictions: No    Mobility  Bed Mobility               General bed mobility comments: OOB at entry   Transfers Overall transfer level: Needs assistance Equipment used: Rolling walker (2 wheeled) Transfers: Sit to/from Stand Sit to Stand: Min assist;Min guard         General transfer comment: Min A to assist and staedy  Ambulation/Gait Ambulation/Gait assistance: Min guard Gait Distance (Feet): 120 Feet Assistive device: Rolling walker (2 wheeled) Gait Pattern/deviations: Step-through pattern;Decreased step length - right;Decreased step length - left;Trunk flexed Gait velocity: decreased   General Gait Details: Close min guard secondary to SOB. Initiated amb on 4 L and tried to give non-verbal demonstrational cues for controlled pursed lip breathing; O2 sats ranged 92-99% (used hot pack to warm pt's fingers prior to walking)   Optometrist     Modified Rankin (Stroke Patients Only)       Balance Overall balance assessment: Needs assistance Sitting-balance support: Bilateral upper extremity supported;Feet supported Sitting balance-Leahy Scale: Fair Sitting balance - Comments: able to sit EOB w/o physical assist, but requires UE support   Standing balance support: Bilateral upper extremity supported Standing balance-Leahy Scale: Poor                              Cognition Arousal/Alertness: Awake/alert Behavior During Therapy: WFL for tasks assessed/performed Overall Cognitive Status: Within Functional Limits for tasks assessed                                        Exercises      General Comments        Pertinent Vitals/Pain Pain Assessment: No/denies pain    Home Living                      Prior Function            PT Goals (current goals can now be found in the care plan section) Acute Rehab PT Goals Patient Stated Goal: get stronger and breathe better PT Goal Formulation: With patient Time For Goal Achievement: 02/09/18 Potential to Achieve Goals: Good Progress towards PT goals: Progressing toward goals    Frequency  Min 3X/week      PT Plan Current plan remains appropriate    Co-evaluation              AM-PAC PT "6 Clicks" Daily Activity  Outcome Measure  Difficulty turning over in bed (including adjusting bedclothes, sheets and blankets)?: A Little Difficulty moving from lying on back to sitting on the side of the bed? : A Little Difficulty sitting down on and standing up from a chair with arms (e.g., wheelchair, bedside commode, etc,.)?: A Little Help needed moving to and from a bed to chair (including a wheelchair)?: A Little Help needed walking in hospital room?: A Little Help needed climbing 3-5 steps with a railing? : A Lot 6 Click Score: 17    End of Session Equipment Utilized During Treatment: Gait belt;Oxygen(3L) Activity  Tolerance: Patient tolerated treatment well Patient left: in bed;with call bell/phone within reach;with bed alarm set Nurse Communication: Mobility status PT Visit Diagnosis: Other abnormalities of gait and mobility (R26.89);Muscle weakness (generalized) (M62.81);Difficulty in walking, not elsewhere classified (R26.2)     Time: 1610-9604 PT Time Calculation (min) (ACUTE ONLY): 19 min  Charges:  $Gait Training: 8-22 mins                    G Codes:       Etta Grandchild, PT, DPT Acute Rehab Services Pager: [phone]     Etta Grandchild 02/01/2018, 12:44 PM

## 2018-02-01 NOTE — Progress Notes (Signed)
Report called to [facility]. Transport arranged for 1330.

## 2018-02-01 NOTE — Clinical Social Work Note (Signed)
CSW facilitated patient discharge including contacting patient family and facility to confirm patient discharge plans. Clinical information faxed to facility and family agreeable with plan. CSW arranged ambulance transport via [facility] to [facility] at 1:30 pm. RN to call report prior to discharge [phone] Room 1203P).  CSW will sign off for now as social work intervention is no longer needed. Please consult [place] again if new needs arise.  Charlynn Court, CSW [phone]

## 2018-02-01 NOTE — Discharge Summary (Signed)
Physician Discharge Summary  DUSTI TETRO XBJ:[id number] DOB: 17-May-1941 DOA: 01/25/2018  PCP: Maurice Small, MD  Admit date: 01/25/2018 Discharge date: 02/01/2018  Time spent: 45* minutes  Recommendations for Outpatient Follow-up:  Follow up Dr Maple Hudson , Pulmonology in 2 weeks on 02/14/18 at 11 30 am  Check BMP in one week on 02/08/18   Discharge Diagnoses:  Principal Problem:   Acute on chronic respiratory failure with hypercapnia (HCC) Active Problems:   Pulmonary hypertension (HCC)   Chronic anticoagulation   HTN (hypertension)   Acute on chronic diastolic heart failure (HCC)   Symptomatic anemia   Heart failure (HCC)   Discharge Condition: Stable  Diet recommendation: Heart healthy diet  Filed Weights   01/30/18 0455 01/31/18 0531 02/01/18 0501  Weight: 90.1 kg (198 lb 11.2 oz) 90 kg (198 lb 8 oz) 89.5 kg (197 lb 4.8 oz)    History of present illness:  77 year old female came with dyspnea, significant history of pulmonary hypertension, COPD, chronic hypoxic respiratory failure, hypertension, dyslipidemia, CAD status post bypass graft, diastolic heart failure, OSA.    Hospital Course:  1. Acute on chronic diastolic CHF-improved with diuresis with furosemide 60 mg p.o. daily.  Continue isosorbide, metoprolol.  Preserved LV function and echocardiogram from February 2019. Will restart home medications Torsemide 80 mg po bid 2. COPD exacerbation/pulmonary hypertension- improved with  Solu-Medrol  40 mg IV every 6 hours. Continue ipratropium nebulizer every 6 hours.Will discharge on Prednisone taper. 3. 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  No change from previous CT from July 2018. Discussed with Pulmonology Dr Delton Coombes, they will see her in clinic in 2 weeks. 4. Acute kidney injury on CKD stage III-stable renal function with serum creatinine stable at 1.10 5. Chronic atrial fibrillation-continue amiodarone, metoprolol.  Anticoagulation with  Xarelto. 6. Hypertension-blood pressure is stable, continue metoprolol, amlodipine.    Procedures:  Echocardiogram  Consultations:  None  Discharge Exam: Vitals:   02/01/18 0501 02/01/18 0810  BP: 102/85   Pulse: 78   Resp: 16   Temp: 98.1 F (36.7 C)   SpO2: 93% 95%    General: Appears in no acute distress Cardiovascular: S1S2 RRR Respiratory: Clear bilaterally  Discharge Instructions   Discharge Instructions    Diet - low sodium heart healthy   Complete by:  As directed    Increase activity slowly   Complete by:  As directed      Allergies as of 02/01/2018      Reactions   Lisinopril Cough   Developed persistent dry cough for a month.    Tape Other (See Comments)   Bleeding   Xopenex [levalbuterol] Other (See Comments)   Made mouth and throat sore   Cefpodoxime Other (See Comments)   Yeast infections    Codeine Other (See Comments)   Anxious/ jittery   Lipitor [atorvastatin] Other (See Comments)   Made joint start hurting/ Muscle aches   Other Swelling   Venholin HFA   Penicillins Other (See Comments)   Has patient had a PCN reaction causing immediate rash, facial/tongue/throat swelling, SOB or lightheadedness with hypotension: unknown, childhood reaction Has patient had a PCN reaction causing severe rash involving mucus membranes or skin necrosis: No Has patient had a PCN reaction that required hospitalization No Has patient had a PCN reaction occurring within the last 10 years: No If all of the above answers are "NO", then may proceed with Cephalosporin use.   Ventolin [kdc:albuterol] Other (See Comments)   "jittery"   Anoro  Ellipta [umeclidinium-vilanterol] Itching   Clindamycin Other (See Comments)   Unknown    Hydrocodone-acetaminophen Anxiety   Latex Itching, Rash   Levalbuterol Hcl Other (See Comments)   Unknown       Medication List    TAKE these medications   acetaminophen 500 MG tablet Commonly known as:  TYLENOL Take 1,000 mg by  mouth 2 (two) times daily as needed for headache.   amiodarone 200 MG tablet Commonly known as:  PACERONE Take 1 tablet (200 mg total) by mouth daily.   amLODipine 2.5 MG tablet Commonly known as:  NORVASC Take 1 tablet (2.5 mg total) by mouth daily.   benzonatate 200 MG capsule Commonly known as:  TESSALON Take 1 capsule (200 mg total) by mouth 3 (three) times daily as needed for cough.   budesonide 0.5 MG/2ML nebulizer solution Commonly known as:  PULMICORT Take 2 mLs (0.5 mg total) by nebulization 2 (two) times daily.   calcium-vitamin D 500-200 MG-UNIT tablet Commonly known as:  OSCAL WITH D Take 2 tablets by mouth 2 (two) times daily.   clonazePAM 0.5 MG tablet Commonly known as:  KLONOPIN Take 1 tablet (0.5 mg total) by mouth at bedtime.   diclofenac sodium 1 % Gel Commonly known as:  VOLTAREN Apply 2 g topically daily as needed (pain).   docusate sodium 100 MG capsule Commonly known as:  COLACE Take 200 mg by mouth as needed for mild constipation.   ipratropium 0.02 % nebulizer solution Commonly known as:  ATROVENT Take 2.5 mLs (0.5 mg total) by nebulization 4 (four) times daily. Dx: J44.9   IRON PO Take 27 mg by mouth daily.   isosorbide mononitrate 60 MG 24 hr tablet Commonly known as:  IMDUR Take 1 tablet (60 mg total) daily by mouth. What changed:  when to take this   loratadine 10 MG tablet Commonly known as:  CLARITIN Take 10 mg by mouth daily.   metoprolol tartrate 50 MG tablet Commonly known as:  LOPRESSOR Take 1 tablet (50 mg total) by mouth 2 (two) times daily.   NITROSTAT 0.4 MG SL tablet Generic drug:  nitroGLYCERIN place 1 tablet under the tongue if needed every 5 minutes for chest pain for 3 doses IF NO RELIEF AFTER 3RD DOSE CALL PRESCRIBER OR 911.   omeprazole 40 MG capsule Commonly known as:  PRILOSEC Take 40 mg by mouth daily.   OXYGEN Inhale 2-3 L into the lungs continuous. At rest 2 liters  Moving around 3 liters   potassium  chloride SA 20 MEQ tablet Commonly known as:  K-DUR,KLOR-CON TAKE 2 TABLETS BY MOUTH TWO TIMES DAILY   predniSONE 10 MG tablet Commonly known as:  DELTASONE Prednisone 40 mg po daily x 2 day then Prednisone 30 mg po daily x 2 day then Prednisone 20 mg po daily x 2 day then Prednisone 10 mg daily x 2 day then stop...   Rivaroxaban 15 MG Tabs tablet Commonly known as:  XARELTO Take 1 tablet (15 mg total) by mouth daily with supper.   simvastatin 20 MG tablet Commonly known as:  ZOCOR TAKE 3 TABLETS BY MOUTH  DAILY AT 6 PM   spironolactone 25 MG tablet Commonly known as:  ALDACTONE TAKE 1 TABLET BY MOUTH  DAILY   torsemide 20 MG tablet Commonly known as:  DEMADEX Take 4 tablets (80 mg total) by mouth 2 (two) times daily.      Allergies  Allergen Reactions  . Lisinopril Cough    Developed  persistent dry cough for a month.   . Tape Other (See Comments)    Bleeding  . Xopenex [Levalbuterol] Other (See Comments)    Made mouth and throat sore  . Cefpodoxime Other (See Comments)    Yeast infections   . Codeine Other (See Comments)    Anxious/ jittery  . Lipitor [Atorvastatin] Other (See Comments)    Made joint start hurting/ Muscle aches  . Other Swelling    Venholin HFA  . Penicillins Other (See Comments)    Has patient had a PCN reaction causing immediate rash, facial/tongue/throat swelling, SOB or lightheadedness with hypotension: unknown, childhood reaction Has patient had a PCN reaction causing severe rash involving mucus membranes or skin necrosis: No Has patient had a PCN reaction that required hospitalization No Has patient had a PCN reaction occurring within the last 10 years: No If all of the above answers are "NO", then may proceed with Cephalosporin use.   Terald Sleeper [Kdc:Albuterol] Other (See Comments)    "jittery"  . Anoro Ellipta [Umeclidinium-Vilanterol] Itching  . Clindamycin Other (See Comments)    Unknown   . Hydrocodone-Acetaminophen Anxiety  .  Latex Itching and Rash  . Levalbuterol Hcl Other (See Comments)    Unknown     Contact information for follow-up providers    Waymon Budge, MD Follow up on 02/14/2018.   Specialty:  Pulmonary Disease Why:  11:30 am Contact information: [address] [place] [address] [phone]            Contact information for after-discharge care    Destination    [facility] .   Service:  Skilled Nursing Contact information: [address] [place] [address] [phone]                   The results of significant diagnostics from this hospitalization (including imaging, microbiology, ancillary and laboratory) are listed below for reference.    Significant Diagnostic Studies: Dg Chest 2 View  Result Date: 01/25/2018 CLINICAL DATA:  77 year old female with a history of increased shortness of breath EXAM: CHEST - 2 VIEW COMPARISON:  01/12/2018 FINDINGS: Cardiomediastinal silhouette is unchanged in size and contour. Surgical changes of median sternotomy and CABG. Low lung volumes with persisting mixed interstitial and airspace opacity at the bilateral lung bases. Blunting of the costophrenic sulcus on the lateral view. No pneumothorax. Degenerative changes of the spine. IMPRESSION: Low lung volumes with mixed interstitial and airspace opacity at the lung bases, may reflect a combination of edema, atelectasis/consolidation, and scarring. Correlation with lab values may be useful. Trace left-sided pleural effusion not excluded. Surgical changes of median sternotomy and CABG. Electronically Signed   By: Gilmer Mor D.O.   On: 01/25/2018 08:43   Dg Chest 2 View  Result Date: 01/12/2018 CLINICAL DATA:  Shortness of breath EXAM: CHEST - 2 VIEW COMPARISON:  Dec 19, 2017 and Dec 13, 2017 FINDINGS: There is bibasilar atelectasis. There is no edema or consolidation. Heart is mildly enlarged with pulmonary vascularity normal. No adenopathy. There is aortic  atherosclerosis. Patient is status post coronary artery bypass grafting. There are surgical clips in the right cervical-thoracic junction region. Bones are osteoporotic. There is anterior wedging of the L1 vertebral body, stable. IMPRESSION: Bibasilar atelectasis. No edema or consolidation. Stable cardiac prominence. Aortic atherosclerosis. Status post coronary artery bypass grafting. Aortic Atherosclerosis (ICD10-I70.0). Electronically Signed   By: Bretta Bang III M.D.   On: 01/12/2018 13:34   Ct Chest W Contrast  Result Date: 01/30/2018 CLINICAL DATA:  77 year old with recent progressive shortness of breath associated with BILATERAL LOWER extremity edema and weight gain. Current history of pulmonary hypertension, COPD with chronic hypoxic respiratory failure, hypertension, diastolic heart failure and coronary artery disease with prior CABG. EXAM: CT CHEST WITH CONTRAST TECHNIQUE: Multidetector CT imaging of the chest was performed during intravenous contrast administration. CONTRAST:  OMNIPAQUE IOHEXOL 300 MG/ML IV. COMPARISON:  CT chest 11/01/2017, 01/23/2017. Chest x-rays 01/25/2018 and earlier. FINDINGS: Cardiovascular: Heart markedly enlarged with biatrial enlargement in particular. Prior CABG. The LAD, RIGHT coronary and LIMA grafts appear patent, though there is dystrophic calcification involving the proximal LAD graft. No pericardial effusion. Severe atherosclerosis involving the thoracic and proximal abdominal aorta without evidence of aneurysm, maximum ascending thoracic aortic diameter 3.7 cm. Atherosclerosis involving the proximal great vessels with borderline stenosis of the origin of the LEFT common carotid artery. Central pulmonary arteries patent. Calcification involving the wall of the LEFT lower lobe pulmonary artery is again noted. Mediastinum/Nodes: Numerous normal-sized lymph nodes throughout the mediastinum and in both hila. No pathologically enlarged mediastinal, hilar or  axillary lymph nodes. No mediastinal masses. Normal-appearing esophagus. Prior RIGHT hemithyroidectomy. Multiple small nodules involving the residual LEFT lobe, unchanged, without evidence of a dominant nodule. Lungs/Pleura: The previously identified ground-glass nodule involving the LEFT lung apex currently has a more solid appearance currently, measuring approximately 1.1 x 1.0 cm, not significantly changed in size since the 01/23/2017 CT where it measured approximately 1.0 x 0.9 cm. The pleural based 4 mm nodule in the POSTERIOR RIGHT apex is unchanged. Diffuse interstitial pulmonary edema is present. Linear atelectasis is present in the RIGHT lower lobe. Atelectasis superimposed upon scarring is present in the LEFT lower lobe. No pleural effusions. Central airways patent without significant bronchial wall thickening. Upper Abdomen: Enlargement of the LEFT lobe of the liver relative to the RIGHT lobe, with associated enlargement of the caudate lobe. No focal parenchymal abnormality within the visualized liver. Visualized upper abdomen otherwise unremarkable. Musculoskeletal: Degenerative disc disease and spondylosis throughout the thoracic spine. Mild compression fracture of the UPPER endplate of T12 and the LOWER endplate of T5 which are unchanged. No acute osseous findings. IMPRESSION: 1. Mild CHF, with evidence of mild diffuse interstitial pulmonary edema. 2. BILATERAL lower lobe atelectasis. 3. LEFT apical lung nodule, previously ground-glass, now has a more solid appearance, though is not significantly changed in size since the CT in July, 2018. Adenocarcinoma is considered. Thoracic surgery consultation is recommended. PET/CT should be considered for staging purposes. These recommendations are taken from: Recommendations for the Management of Subsolid Pulmonary Nodules Detected at CT: A Statement from the Fleischner Society Radiology 2013; 266:1, 304-317. 4. Calcification involving the wall of the LEFT  lower lobe pulmonary artery indicates pulmonary arterial hypertension. 5. Hepatic cirrhosis is suspected, as there is relative enlargement of the LEFT lobe compared to the RIGHT lobe. Aortic Atherosclerosis (ICD10-I70.0). Electronically Signed   By: Hulan Saas M.D.   On: 01/30/2018 17:11    Microbiology: No results found for this or any previous visit (from the past 240 hour(s)).   Labs: Basic Metabolic Panel: Recent Labs  Lab 01/28/18 0651 01/29/18 0503 01/30/18 7829 01/31/18 0607 02/01/18 0605  NA 143 141 145 146* 145  K 3.5 3.9 3.6 4.3 4.4  CL 93* 97* 95* 99 95*  CO2 42* 40* 40* 42* 39*  GLUCOSE 123* 122* 104* 122* 195*  BUN 18 26* 27* 27* 30*  CREATININE 0.96 1.05* 1.04* 0.87 1.10*  CALCIUM  9.8 9.2 9.4 9.5 9.4   Liver Function Tests: No results for input(s): AST, ALT, ALKPHOS, BILITOT, PROT, ALBUMIN in the last 168 hours. No results for input(s): LIPASE, AMYLASE in the last 168 hours. No results for input(s): AMMONIA in the last 168 hours. CBC: Recent Labs  Lab 01/26/18 0516 01/27/18 0507 01/28/18 0651  WBC 11.4* 14.4* 9.4  NEUTROABS 8.9* 12.2* 8.2*  HGB 10.5* 11.2* 10.8*  HCT 34.9* 39.0 38.0  MCV 93.1 96.1 96.9  PLT 255 252 242   Cardiac Enzymes: No results for input(s): CKTOTAL, CKMB, CKMBINDEX, TROPONINI in the last 168 hours. BNP: BNP (last 3 results) Recent Labs    12/13/17 1153 01/18/18 1341 01/25/18 0749  BNP 145.7* 254.3* 507.3*    ProBNP (last 3 results) No results for input(s): PROBNP in the last 8760 hours.  CBG: No results for input(s): GLUCAP in the last 168 hours.     Signed:  Meredeth Ide MD.  [facility] 02/01/2018, 11:11 AM

## 2018-02-01 NOTE — Clinical Social Work Placement (Signed)
   CLINICAL SOCIAL WORK PLACEMENT  NOTE  Date:  02/01/2018  Patient Details  Name: [name] MRN: [id number] Date of Birth: [birth year]  Clinical Social Work is seeking post-discharge placement for this patient at the Skilled  Nursing Facility level of care (*CSW will initial, date and re-position this form in  chart as items are completed):  Yes   Patient/family provided with [facility] Clinical Social Work Department's list of facilities offering this level of care within the geographic area requested by the patient (or if unable, by the patient's family).  Yes   Patient/family informed of their freedom to choose among providers that offer the needed level of care, that participate in Medicare, Medicaid or managed care program needed by the patient, have an available bed and are willing to accept the patient.  Yes   Patient/family informed of [place]'s ownership interest in [facility] and [facility], as well as of the fact that they are under no obligation to receive care at these facilities.  PASRR submitted to EDS on 02/01/18     PASRR number received on       Existing PASRR number confirmed on 02/01/18     FL2 transmitted to all facilities in geographic area requested by pt/family on 01/27/18     FL2 transmitted to all facilities within larger geographic area on       Patient informed that his/her managed care company has contracts with or will negotiate with certain facilities, including the following:        Yes   Patient/family informed of bed offers received.  Patient chooses bed at [facility]     Physician recommends and patient chooses bed at      Patient to be transferred to [facility] on 02/01/18.  Patient to be transferred to facility by [facility]     Patient family notified on 02/01/18 of transfer.  Name of family member notified:  Patient already notified family     PHYSICIAN Please sign DNR, Please prepare prescriptions     Additional  Comment:    _______________________________________________ Margarito Liner, LCSW 02/01/2018, 11:47 AM

## 2018-02-01 NOTE — Progress Notes (Signed)
Patient will d/c from unit today and go to [facility] for rehab. All personal belongings with patient. Pt displays no s/sx of distress. Continues with 02 at 3 liters [place]. Report to be called to [facility] prior to transport. Daughter request that her Mom call her prior to leaving hospital so that she can meet her mom at facility.  AVS with packet of papers for facility.

## 2018-02-05 ENCOUNTER — Encounter (HOSPITAL_COMMUNITY): Payer: Medicare Other

## 2018-02-12 ENCOUNTER — Other Ambulatory Visit (HOSPITAL_COMMUNITY): Payer: Self-pay | Admitting: *Deleted

## 2018-02-12 MED ORDER — TORSEMIDE 20 MG PO TABS: 80.0000 mg | ORAL_TABLET | Freq: Two times a day (BID) | ORAL | 3 refills | Status: AC

## 2018-02-14 ENCOUNTER — Encounter (HOSPITAL_COMMUNITY): Payer: Medicare Other

## 2018-02-14 ENCOUNTER — Encounter: Payer: Self-pay | Admitting: Internal Medicine

## 2018-02-14 ENCOUNTER — Ambulatory Visit: Payer: Medicare Other | Admitting: Internal Medicine

## 2018-02-14 DIAGNOSIS — R911 Solitary pulmonary nodule: Secondary | ICD-10-CM

## 2018-02-14 DIAGNOSIS — I5033 Acute on chronic diastolic (congestive) heart failure: Secondary | ICD-10-CM | POA: Diagnosis not present

## 2018-02-14 NOTE — Assessment & Plan Note (Signed)
CT chest imaging from 7/9/9 demonstrates mild CHF with interstitial edema.  She has neck vein distention and is requiring wrapping of her lower legs for peripheral edema.  This degree of fluid overload is likely to contribute to dyspnea. Plan-follow-up with CHF clinic

## 2018-02-14 NOTE — Patient Instructions (Addendum)
Order- referral to Thoracic Surgery, Dr Dorris Fetch   Left lung nodule 1.1 cm  Continue O2 2l  Continue present breathing meds  Make follow-up appointment with the CHF Clinic   Please call if we can help

## 2018-02-14 NOTE — Progress Notes (Signed)
Patient ID: [name], female    DOB: 1941-04-23, 77 y.o.   MRN: [id number]   F former smoker followed for female former smoker followed for COPD, Lung Nodules,  PLMS, complicated by hypertension, CHF, CAD/MI/ AFib Sleep study on 02/22/16 showed no OSA with AHI 0. Severe PLM  PFT 10/26/16-severe obstructive airways disease, insignificant response, severe restriction, severe diffusion defect.  FVC 1.38/46%, FEV1 0.98/43%, ratio 0.71, TLC 69%, DLCO 42% ----------------------------------------------------------------------------------------------   06/26/17- 3 year-old female former smoker followed for COPD, Chronic Respiratory Failure, lung nodules, PLMS, complicated by  hypertension, CHF, CAD/MI/ AFib/ Xarelto O2 2 L/[employer]  requalified for o2 today Pending f/u CT for lung nodules July, 2019 ---Chronic Resp Failure: Pt states without O2 she is unable to go any where or do anything. Pt recently had bout of dry cough-spoke with PCP office and told to use Mucinex OTC.  Clear mucus, no fever.  No inhalers PFT 10/26/16-severe obstructive airways disease, insignificant response, severe restriction, severe diffusion defect.  FVC 1.38/46%, FEV1 0.98/43%, ratio 0.71, TLC 69%, DLCO 42%  02/14/2018- 24 year-old female former smoker followed for COPD, Chronic Respiratory Failure, lung nodules, PLMS, complicated by  hypertension, CHF, CAD/MI/ chronic  AFib/ Xarelto O2 2 L/[employer]  Acute hospitalization 7/4-7/11: Acute on chronic respiratory failure with hypercapnia, pulmonary hypertension, chronic anticoagulation, acute on chronic diastolic heart failure.  Treated with diuretics, IV steroids with discharge on prednisone taper, required transfusion. Says her breathing is good with little cough, dry.  Some rhinitis helped by Claritin.  Continues oxygen.  Finished prednisone.  Occasional lightheadedness.  Now living at [facility].  Unable to shed the fluid weight she put on in hospital. We discussed her CT  report, status of heart failure and lung nodule. CT chest 01/30/2018 IMPRESSION: 1. Mild CHF, with evidence of mild diffuse interstitial pulmonary edema. 2. BILATERAL lower lobe atelectasis. 3. LEFT apical lung nodule, previously ground-glass, now has a more solid appearance, though is not significantly changed in size since the CT in July, 2018. Adenocarcinoma is considered. Thoracic surgery consultation is recommended. PET/CT should be considered for staging purposes. These recommendations are taken from: Recommendations for the Management of Subsolid Pulmonary Nodules Detected at CT: A Statement from the Fleischner Society Radiology 2013; 266:1, 304-317. 4. Calcification involving the wall of the LEFT lower lobe pulmonary artery indicates pulmonary arterial hypertension. 5. Hepatic cirrhosis is suspected, as there is relative enlargement of the LEFT lobe compared to the RIGHT lobe. Aortic Atherosclerosis (ICD10-I70.0).  Review of Systems- see HPI + = positive Constitutional:   + weight loss, night sweats, no-Fevers, chills,No- fatigue, lassitude. HEENT:   + headaches,  No-Difficulty swallowing,  Tooth/dental problems, Sore throat,                No-sneezing, itching, ear ache, + nasal congestion, CV:  No chest pain, orthopnea, PND, +swelling in lower extremities, no-anasarca, dizziness, palpitations GI  No heartburn, indigestion, abdominal pain, nausea, vomiting, ,  Resp:   No excess mucus, no productive cough,  + non-productive cough,  No coughing up of blood.    No                    change in color of mucus.  No wheezing.   Skin: no rash or lesions. GU: . MS:  No joint pain or swelling.   Psych:  No change in mood or affect. No depression +anxiety.  No memory loss.  Objective:   Physical Exam  General- +Alert, Oriented,  Affect-appropriate, Distress- none acute        + On O2 2L  Skin- rash-none, lesions- none, excoriation- none Lymphadenopathy- none Head- atraumatic             Eyes- Gross vision intact, PERRLA, conjunctivae clear secretions            Ears- Hearing, canals normal            Nose- Clear, No-septal dev, mucus, polyps, erosion, perforation             Throat- Mallampati III , mucosa clear/ dry , drainage- none, tonsils- atrophic.                        Large tongue. Neck- flexible , trachea midline, no stridor , thyroid nl, carotid no bruit Chest - symmetrical excursion , unlabored           Heart/CV- + pulses regular to palpation,Murmur 1/6 S , no gallop  , no rub, nl s1 s2                           - JVD+ , edema+ legs are wrapped with dressings, stasis changes- none, varices- none.           Lung-  Clear to P&A/Diminished/unlabored, wheeze- none , dullness-none, rub- none           Chest wall-  Abd-        + lumbar scoliosis/ juts right hip Br/ Gen/ Rectal- Not done, not indicated Extrem- cyanosis- none, clubbing, none, atrophy- none, strength- nl, + wheelchair Neuro- grossly intact to observation  Assessment & Plan:

## 2018-02-14 NOTE — Assessment & Plan Note (Signed)
Left upper lobe nodule is acting like an adenocarcinoma.  She is not a surgical candidate now but I will refer to thoracic surgery to establish contact and open consideration of options. Can anticipate PET scan and consider biopsy options. I explained this to her.

## 2018-02-21 ENCOUNTER — Emergency Department (HOSPITAL_COMMUNITY): Payer: Medicare Other

## 2018-02-21 ENCOUNTER — Other Ambulatory Visit: Payer: Self-pay

## 2018-02-21 ENCOUNTER — Encounter (HOSPITAL_COMMUNITY): Payer: Self-pay

## 2018-02-21 ENCOUNTER — Inpatient Hospital Stay (HOSPITAL_COMMUNITY)
Admission: EM | Admit: 2018-02-21 | Discharge: 2018-02-27 | DRG: 286 | Disposition: A | Discharge status: Home Health | Payer: Medicare Other | Attending: Family Medicine | Admitting: Family Medicine

## 2018-02-21 DIAGNOSIS — Z66 Do not resuscitate: Secondary | ICD-10-CM | POA: Diagnosis present

## 2018-02-21 DIAGNOSIS — Z880 Allergy status to penicillin: Secondary | ICD-10-CM

## 2018-02-21 DIAGNOSIS — I5033 Acute on chronic diastolic (congestive) heart failure: Secondary | ICD-10-CM | POA: Diagnosis not present

## 2018-02-21 DIAGNOSIS — J9622 Acute and chronic respiratory failure with hypercapnia: Secondary | ICD-10-CM | POA: Diagnosis not present

## 2018-02-21 DIAGNOSIS — J9611 Chronic respiratory failure with hypoxia: Secondary | ICD-10-CM | POA: Diagnosis not present

## 2018-02-21 DIAGNOSIS — T502X6A Underdosing of carbonic-anhydrase inhibitors, benzothiadiazides and other diuretics, initial encounter: Secondary | ICD-10-CM | POA: Diagnosis present

## 2018-02-21 DIAGNOSIS — F419 Anxiety disorder, unspecified: Secondary | ICD-10-CM | POA: Diagnosis present

## 2018-02-21 DIAGNOSIS — R402140 Coma scale, eyes open, spontaneous, unspecified time: Secondary | ICD-10-CM | POA: Diagnosis present

## 2018-02-21 DIAGNOSIS — I480 Paroxysmal atrial fibrillation: Secondary | ICD-10-CM | POA: Diagnosis present

## 2018-02-21 DIAGNOSIS — J441 Chronic obstructive pulmonary disease with (acute) exacerbation: Secondary | ICD-10-CM | POA: Diagnosis not present

## 2018-02-21 DIAGNOSIS — Z888 Allergy status to other drugs, medicaments and biological substances status: Secondary | ICD-10-CM

## 2018-02-21 DIAGNOSIS — Z881 Allergy status to other antibiotic agents status: Secondary | ICD-10-CM

## 2018-02-21 DIAGNOSIS — Z9189 Other specified personal risk factors, not elsewhere classified: Secondary | ICD-10-CM

## 2018-02-21 DIAGNOSIS — R911 Solitary pulmonary nodule: Secondary | ICD-10-CM | POA: Diagnosis present

## 2018-02-21 DIAGNOSIS — F329 Major depressive disorder, single episode, unspecified: Secondary | ICD-10-CM | POA: Diagnosis present

## 2018-02-21 DIAGNOSIS — Z9981 Dependence on supplemental oxygen: Secondary | ICD-10-CM | POA: Diagnosis not present

## 2018-02-21 DIAGNOSIS — Z6832 Body mass index (BMI) 32.0-32.9, adult: Secondary | ICD-10-CM

## 2018-02-21 DIAGNOSIS — Z8711 Personal history of peptic ulcer disease: Secondary | ICD-10-CM

## 2018-02-21 DIAGNOSIS — E785 Hyperlipidemia, unspecified: Secondary | ICD-10-CM | POA: Diagnosis present

## 2018-02-21 DIAGNOSIS — Z79899 Other long term (current) drug therapy: Secondary | ICD-10-CM

## 2018-02-21 DIAGNOSIS — I10 Essential (primary) hypertension: Secondary | ICD-10-CM | POA: Diagnosis present

## 2018-02-21 DIAGNOSIS — Z951 Presence of aortocoronary bypass graft: Secondary | ICD-10-CM

## 2018-02-21 DIAGNOSIS — K219 Gastro-esophageal reflux disease without esophagitis: Secondary | ICD-10-CM | POA: Diagnosis present

## 2018-02-21 DIAGNOSIS — J9621 Acute and chronic respiratory failure with hypoxia: Secondary | ICD-10-CM | POA: Diagnosis not present

## 2018-02-21 DIAGNOSIS — I482 Chronic atrial fibrillation, unspecified: Secondary | ICD-10-CM | POA: Diagnosis present

## 2018-02-21 DIAGNOSIS — D649 Anemia, unspecified: Secondary | ICD-10-CM | POA: Diagnosis not present

## 2018-02-21 DIAGNOSIS — I2510 Atherosclerotic heart disease of native coronary artery without angina pectoris: Secondary | ICD-10-CM | POA: Diagnosis present

## 2018-02-21 DIAGNOSIS — R402360 Coma scale, best motor response, obeys commands, unspecified time: Secondary | ICD-10-CM | POA: Diagnosis present

## 2018-02-21 DIAGNOSIS — E669 Obesity, unspecified: Secondary | ICD-10-CM | POA: Diagnosis present

## 2018-02-21 DIAGNOSIS — Z87891 Personal history of nicotine dependence: Secondary | ICD-10-CM

## 2018-02-21 DIAGNOSIS — R402250 Coma scale, best verbal response, oriented, unspecified time: Secondary | ICD-10-CM | POA: Diagnosis present

## 2018-02-21 DIAGNOSIS — Z885 Allergy status to narcotic agent status: Secondary | ICD-10-CM

## 2018-02-21 DIAGNOSIS — I509 Heart failure, unspecified: Secondary | ICD-10-CM | POA: Diagnosis present

## 2018-02-21 DIAGNOSIS — I4510 Unspecified right bundle-branch block: Secondary | ICD-10-CM | POA: Diagnosis present

## 2018-02-21 DIAGNOSIS — R54 Age-related physical debility: Secondary | ICD-10-CM | POA: Diagnosis present

## 2018-02-21 DIAGNOSIS — G4733 Obstructive sleep apnea (adult) (pediatric): Secondary | ICD-10-CM | POA: Diagnosis present

## 2018-02-21 DIAGNOSIS — J9811 Atelectasis: Secondary | ICD-10-CM | POA: Diagnosis present

## 2018-02-21 DIAGNOSIS — I272 Pulmonary hypertension, unspecified: Secondary | ICD-10-CM | POA: Diagnosis present

## 2018-02-21 DIAGNOSIS — N183 Chronic kidney disease, stage 3 (moderate): Secondary | ICD-10-CM | POA: Diagnosis present

## 2018-02-21 DIAGNOSIS — Z789 Other specified health status: Secondary | ICD-10-CM | POA: Diagnosis not present

## 2018-02-21 DIAGNOSIS — R32 Unspecified urinary incontinence: Secondary | ICD-10-CM | POA: Diagnosis present

## 2018-02-21 DIAGNOSIS — G4700 Insomnia, unspecified: Secondary | ICD-10-CM | POA: Diagnosis not present

## 2018-02-21 DIAGNOSIS — D509 Iron deficiency anemia, unspecified: Secondary | ICD-10-CM | POA: Diagnosis present

## 2018-02-21 DIAGNOSIS — M1990 Unspecified osteoarthritis, unspecified site: Secondary | ICD-10-CM | POA: Diagnosis present

## 2018-02-21 DIAGNOSIS — E878 Other disorders of electrolyte and fluid balance, not elsewhere classified: Secondary | ICD-10-CM | POA: Diagnosis not present

## 2018-02-21 DIAGNOSIS — Z91048 Other nonmedicinal substance allergy status: Secondary | ICD-10-CM

## 2018-02-21 DIAGNOSIS — I083 Combined rheumatic disorders of mitral, aortic and tricuspid valves: Secondary | ICD-10-CM | POA: Diagnosis present

## 2018-02-21 DIAGNOSIS — Z7189 Other specified counseling: Secondary | ICD-10-CM

## 2018-02-21 DIAGNOSIS — Z515 Encounter for palliative care: Secondary | ICD-10-CM | POA: Diagnosis not present

## 2018-02-21 DIAGNOSIS — E876 Hypokalemia: Secondary | ICD-10-CM | POA: Diagnosis present

## 2018-02-21 DIAGNOSIS — J449 Chronic obstructive pulmonary disease, unspecified: Secondary | ICD-10-CM | POA: Diagnosis not present

## 2018-02-21 DIAGNOSIS — I130 Hypertensive heart and chronic kidney disease with heart failure and stage 1 through stage 4 chronic kidney disease, or unspecified chronic kidney disease: Secondary | ICD-10-CM | POA: Diagnosis present

## 2018-02-21 DIAGNOSIS — R0600 Dyspnea, unspecified: Secondary | ICD-10-CM

## 2018-02-21 DIAGNOSIS — R7989 Other specified abnormal findings of blood chemistry: Secondary | ICD-10-CM | POA: Diagnosis present

## 2018-02-21 DIAGNOSIS — Z91040 Latex allergy status: Secondary | ICD-10-CM

## 2018-02-21 DIAGNOSIS — I491 Atrial premature depolarization: Secondary | ICD-10-CM | POA: Diagnosis present

## 2018-02-21 DIAGNOSIS — Z886 Allergy status to analgesic agent status: Secondary | ICD-10-CM

## 2018-02-21 DIAGNOSIS — Z7901 Long term (current) use of anticoagulants: Secondary | ICD-10-CM

## 2018-02-21 LAB — COMPREHENSIVE METABOLIC PANEL
ALBUMIN: 3.3 g/dL — AB (ref 3.5–5.0)
ALT: 18 U/L (ref 0–44)
AST: 16 U/L (ref 15–41)
Alkaline Phosphatase: 66 U/L (ref 38–126)
Anion gap: 11 (ref 5–15)
BILIRUBIN TOTAL: 0.8 mg/dL (ref 0.3–1.2)
BUN: 26 mg/dL — AB (ref 8–23)
CHLORIDE: 95 mmol/L — AB (ref 98–111)
CO2: 35 mmol/L — ABNORMAL HIGH (ref 22–32)
Calcium: 9.3 mg/dL (ref 8.9–10.3)
Creatinine, Ser: 1.35 mg/dL — ABNORMAL HIGH (ref 0.44–1.00)
GFR calc Af Amer: 42 mL/min — ABNORMAL LOW (ref >60)
GFR calc non Af Amer: 37 mL/min — ABNORMAL LOW (ref >60)
GLUCOSE: 99 mg/dL (ref 70–99)
POTASSIUM: 3.4 mmol/L — AB (ref 3.5–5.1)
SODIUM: 141 mmol/L (ref 135–145)
TOTAL PROTEIN: 6.1 g/dL — AB (ref 6.5–8.1)

## 2018-02-21 LAB — TYPE AND SCREEN
ABO/RH(D): O POS
ANTIBODY SCREEN: NEGATIVE

## 2018-02-21 LAB — CBC WITH DIFFERENTIAL/PLATELET
Abs Immature Granulocytes: 0 10*3/uL (ref 0.0–0.1)
BASOS ABS: 0 10*3/uL (ref 0.0–0.1)
BASOS PCT: 0 %
EOS ABS: 0.3 10*3/uL (ref 0.0–0.7)
EOS PCT: 4 %
HEMATOCRIT: 32.3 % — AB (ref 36.0–46.0)
Hemoglobin: 9.8 g/dL — ABNORMAL LOW (ref 12.0–15.0)
IMMATURE GRANULOCYTES: 0 %
LYMPHS ABS: 1 10*3/uL (ref 0.7–4.0)
LYMPHS PCT: 14 %
MCH: 29.9 pg (ref 26.0–34.0)
MCHC: 30.3 g/dL (ref 30.0–36.0)
MCV: 98.5 fL (ref 78.0–100.0)
Monocytes Absolute: 0.8 10*3/uL (ref 0.1–1.0)
Monocytes Relative: 12 %
NEUTROS ABS: 4.6 10*3/uL (ref 1.7–7.7)
NEUTROS PCT: 70 %
PLATELETS: 176 10*3/uL (ref 150–400)
RBC: 3.28 MIL/uL — AB (ref 3.87–5.11)
RDW: 16.5 % — AB (ref 11.5–15.5)
WBC: 6.7 10*3/uL (ref 4.0–10.5)

## 2018-02-21 LAB — URINALYSIS, ROUTINE W REFLEX MICROSCOPIC
BILIRUBIN URINE: NEGATIVE
GLUCOSE, UA: NEGATIVE mg/dL
Hgb urine dipstick: NEGATIVE
KETONES UR: NEGATIVE mg/dL
LEUKOCYTES UA: NEGATIVE
NITRITE: NEGATIVE
PH: 6 (ref 5.0–8.0)
PROTEIN: NEGATIVE mg/dL
Specific Gravity, Urine: 1.006 (ref 1.005–1.030)

## 2018-02-21 LAB — POC OCCULT BLOOD, ED: Fecal Occult Bld: NEGATIVE

## 2018-02-21 LAB — TROPONIN I: Troponin I: <0.03 ng/mL (ref <0.03)

## 2018-02-21 LAB — BRAIN NATRIURETIC PEPTIDE: B Natriuretic Peptide: 252.7 pg/mL — ABNORMAL HIGH (ref 0.0–100.0)

## 2018-02-21 MED ORDER — FUROSEMIDE 10 MG/ML IJ SOLN: 80.0000 mg | Freq: Two times a day (BID) | INTRAMUSCULAR | Status: DC

## 2018-02-21 MED ORDER — SODIUM CHLORIDE 0.9% FLUSH: 3.0000 mL | INTRAVENOUS | Status: DC | PRN

## 2018-02-21 MED ORDER — ONDANSETRON HCL 4 MG/2ML IJ SOLN: 4.0000 mg | Freq: Four times a day (QID) | INTRAMUSCULAR | Status: DC | PRN

## 2018-02-21 MED ORDER — MAGNESIUM SULFATE IN D5W 1-5 GM/100ML-% IV SOLN
1.0000 g | Freq: Once | INTRAVENOUS | Status: AC
  Administered 2018-02-22: 1 g via INTRAVENOUS
  Filled 2018-02-21 (×2): qty 100

## 2018-02-21 MED ORDER — PANTOPRAZOLE SODIUM 40 MG PO TBEC
40.0000 mg | DELAYED_RELEASE_TABLET | Freq: Every day | ORAL | Status: DC
  Administered 2018-02-22 – 2018-02-27 (×6): 40 mg via ORAL
  Filled 2018-02-21 (×7): qty 1

## 2018-02-21 MED ORDER — SPIRONOLACTONE 25 MG PO TABS
25.0000 mg | ORAL_TABLET | Freq: Every day | ORAL | Status: DC
  Administered 2018-02-22 – 2018-02-27 (×6): 25 mg via ORAL
  Filled 2018-02-21 (×7): qty 1

## 2018-02-21 MED ORDER — DICLOFENAC SODIUM 1 % TD GEL
2.0000 g | Freq: Every day | TRANSDERMAL | Status: DC | PRN
  Filled 2018-02-21: qty 100

## 2018-02-21 MED ORDER — METOPROLOL TARTRATE 50 MG PO TABS
50.0000 mg | ORAL_TABLET | Freq: Two times a day (BID) | ORAL | Status: DC
  Administered 2018-02-22 – 2018-02-27 (×11): 50 mg via ORAL
  Filled 2018-02-21 (×4): qty 1
  Filled 2018-02-21: qty 2
  Filled 2018-02-21 (×2): qty 1
  Filled 2018-02-21: qty 2
  Filled 2018-02-21 (×2): qty 1
  Filled 2018-02-21: qty 2

## 2018-02-21 MED ORDER — AMIODARONE HCL 200 MG PO TABS
200.0000 mg | ORAL_TABLET | Freq: Every day | ORAL | Status: DC
  Administered 2018-02-22 – 2018-02-27 (×6): 200 mg via ORAL
  Filled 2018-02-21 (×7): qty 1

## 2018-02-21 MED ORDER — SODIUM CHLORIDE 0.9 % IV SOLN: 250.0000 mL | INTRAVENOUS | Status: DC | PRN

## 2018-02-21 MED ORDER — FUROSEMIDE 10 MG/ML IJ SOLN
80.0000 mg | Freq: Once | INTRAMUSCULAR | Status: AC
  Administered 2018-02-21: 80 mg via INTRAVENOUS
  Filled 2018-02-21: qty 8

## 2018-02-21 MED ORDER — ACETAMINOPHEN 325 MG PO TABS
650.0000 mg | ORAL_TABLET | ORAL | Status: DC | PRN
  Administered 2018-02-23: 650 mg via ORAL
  Filled 2018-02-21: qty 2

## 2018-02-21 MED ORDER — RIVAROXABAN 15 MG PO TABS
15.0000 mg | ORAL_TABLET | Freq: Every day | ORAL | Status: AC
  Administered 2018-02-22 – 2018-02-24 (×3): 15 mg via ORAL
  Filled 2018-02-21 (×3): qty 1

## 2018-02-21 MED ORDER — POTASSIUM CHLORIDE CRYS ER 20 MEQ PO TBCR
40.0000 meq | EXTENDED_RELEASE_TABLET | Freq: Two times a day (BID) | ORAL | Status: DC
  Administered 2018-02-21 – 2018-02-27 (×12): 40 meq via ORAL
  Filled 2018-02-21 (×12): qty 2

## 2018-02-21 MED ORDER — SODIUM CHLORIDE 0.9% FLUSH
3.0000 mL | Freq: Two times a day (BID) | INTRAVENOUS | Status: DC
  Administered 2018-02-21 – 2018-02-27 (×12): 3 mL via INTRAVENOUS

## 2018-02-21 MED ORDER — CLONAZEPAM 0.5 MG PO TABS
0.5000 mg | ORAL_TABLET | Freq: Every day | ORAL | Status: DC
  Administered 2018-02-21 – 2018-02-22 (×2): 0.5 mg via ORAL
  Filled 2018-02-21 (×2): qty 1

## 2018-02-21 MED ORDER — CALCIUM CARBONATE-VITAMIN D 500-200 MG-UNIT PO TABS
2.0000 | ORAL_TABLET | Freq: Every day | ORAL | Status: DC
  Administered 2018-02-22 – 2018-02-27 (×6): 2 via ORAL
  Filled 2018-02-21 (×6): qty 2

## 2018-02-21 MED ORDER — ISOSORBIDE MONONITRATE ER 60 MG PO TB24
90.0000 mg | ORAL_TABLET | Freq: Every day | ORAL | Status: DC
  Administered 2018-02-22 – 2018-02-27 (×6): 90 mg via ORAL
  Filled 2018-02-21 (×3): qty 1
  Filled 2018-02-21: qty 3
  Filled 2018-02-21: qty 1
  Filled 2018-02-21: qty 3

## 2018-02-21 MED ORDER — IPRATROPIUM BROMIDE 0.02 % IN SOLN
0.5000 mg | Freq: Four times a day (QID) | RESPIRATORY_TRACT | Status: DC
  Administered 2018-02-22 – 2018-02-27 (×22): 0.5 mg via RESPIRATORY_TRACT
  Filled 2018-02-21 (×22): qty 2.5

## 2018-02-21 MED ORDER — BENZONATATE 100 MG PO CAPS: 200.0000 mg | ORAL_CAPSULE | Freq: Three times a day (TID) | ORAL | Status: DC | PRN

## 2018-02-21 MED ORDER — SIMVASTATIN 20 MG PO TABS
20.0000 mg | ORAL_TABLET | Freq: Every day | ORAL | Status: DC
  Administered 2018-02-22 – 2018-02-26 (×5): 20 mg via ORAL
  Filled 2018-02-21 (×5): qty 1

## 2018-02-21 NOTE — ED Provider Notes (Signed)
Emergency Department Provider Note   I have reviewed the triage vital signs and the nursing notes.   HISTORY  Chief Complaint Abnormal Lab   HPI [name] is a 77 y.o. female who was sent from her nursing facility secondary to drop in hemoglobin's, low blood pressures and worsening kidney function since being discharged from the hospital on the 11th.  Patient states that her shortness of breath has been about at baseline but she also has lower extremity edema which does not seem to be improving has been present since she left the hospital.  She is on her home 3 L of oxygen and is been saturating well as far as they can tell.  She has not noticed any dark or bright red blood in her stools.  Has not had vomiting blood.  No vaginal bleeding.  No other source of blood loss.  Her doctors think it might be related to kidney function.  Recent fevers or cough.  No abdominal pain or back pain.  No urinary symptoms but does have decreased urine. No other associated or modifying symptoms.    Past Medical History:  Diagnosis Date  . Acute parotitis 01/05/2016  . Anxiety   . Bacteremia, escherichia coli   . Bursitis, shoulder    bilateral  . CHF (congestive heart failure) (HCC)    chronic diasotlic CHF  . Complication of anesthesia    "have trouble getting me awake sometimes; been ok latelhy" (09/24/2015)  . COPD (chronic obstructive pulmonary disease) (HCC)   . Coronary heart disease 2001   s/p CABG  . Depression   . DJD (degenerative joint disease)   . Dyspnea   . GERD (gastroesophageal reflux disease)   . H/O hiatal hernia   . History of rheumatic heart disease    X 3-LAST TIME WHEN PATIENT WAS 77 YRS OLD  . Hyperlipidemia   . Hypertension   . OA (osteoarthritis)   . On home oxygen therapy    "2L; 24/7" (09/24/2015)  . PUD (peptic ulcer disease)   . Pulmonary HTN (HCC)    PASP 50-54mmHg by [id number]  . Sleep apnea    intolerant to CPAP  . Thyroid nodule   . Urinary  incontinence   . Urine incontinence   . Varicose veins     Patient Active Problem List   Diagnosis Date Noted  . Heart failure (HCC) 01/26/2018  . Symptomatic anemia 01/25/2018  . Respiratory distress 12/13/2017  . Metabolic alkalosis 12/13/2017  . Gait disturbance 11/21/2017  . Lung nodule 11/07/2016  . Mitral valve stenosis   . Rheumatic aortic valve disease   . Atrial fibrillation (HCC) 06/12/2016  . Anemia   . Chronic respiratory failure with hypoxia (HCC) 04/20/2016  . Rheumatic mitral valve disease   . PFO (patent foramen ovale)   . Cardiomyopathy (HCC)   . Acute on chronic diastolic heart failure (HCC)   . Chronic atrial fibrillation (HCC)   . Alcoholic cardiomyopathy (HCC)   . Aortic valve vegetation 09/24/2015  . Lumbar spine scoliosis 04/18/2015  . Mitral stenosis 02/10/2015  . Chronic diastolic heart failure (HCC) 05/10/2013  . Atrial flutter (HCC) 05/10/2013  . Chronic anticoagulation 05/10/2013  . HTN (hypertension) 05/10/2013  . Dyslipidemia, goal LDL below 70 05/10/2013  . Seasonal allergic rhinitis 04/11/2012  . Acute on chronic respiratory failure with hypercapnia (HCC) 11/02/2011  . DYSPNEA 03/14/2008  . Coronary atherosclerosis 02/27/2008  . COPD exacerbation (HCC) 02/27/2008  . Pulmonary hypertension (HCC) 02/21/2008  .  DIASTOLIC DYSFUNCTION 02/21/2008    Past Surgical History:  Procedure Laterality Date  . BILATERAL SALPINGOOPHORECTOMY    . CARDIAC CATHETERIZATION  02/11/2008, 01/30/2004, 06/14/2000  . CARDIAC CATHETERIZATION N/A 09/24/2015   Procedure: Right Heart Cath;  Surgeon: Laurey Morale, MD;  Location: [facility] INVASIVE CV LAB;  Service: Cardiovascular;  Laterality: N/A;  . CARDIAC CATHETERIZATION N/A 02/08/2016   Procedure: Right/Left Heart Cath and Coronary/Graft Angiography;  Surgeon: Laurey Morale, MD;  Location: [facility] INVASIVE CV LAB;  Service: Cardiovascular;  Laterality: N/A;  . CARDIOVERSION N/A 06/12/2016   Procedure: CARDIOVERSION;   Surgeon: Duke Salvia, MD;  Location: [facility] OR;  Service: Cardiovascular;  Laterality: N/A;  . CORONARY ARTERY BYPASS GRAFT  06/21/2000   x4  . INCONTINENCE SURGERY     "tacked"  . TEE WITHOUT CARDIOVERSION N/A 09/24/2015   Procedure: TRANSESOPHAGEAL ECHOCARDIOGRAM (TEE);  Surgeon: Laurey Morale, MD;  Location: [facility] ENDOSCOPY;  Service: Cardiovascular;  Laterality: N/A;  . THYROIDECTOMY, PARTIAL  2010   Dr Michaell Cowing  . TOTAL ABDOMINAL HYSTERECTOMY  1990   1990  . TUBAL LIGATION    . VAGINAL DELIVERY  x4    Current Outpatient Rx  . Order #: [id number] Class: Historical Med  . Order #: [id number] Class: Normal  . Order #: [id number] Class: Normal  . Order #: [id number] Class: Normal  . Order #: [id number] Class: Historical Med  . Order #: [id number] Class: Print  . Order #: [id number] Class: No Print  . Order #: [id number] Class: Historical Med  . Order #: [id number] Class: Normal  . Order #: [id number] Class: Historical Med  . Order #: [id number] Class: Normal  . Order #: [id number] Class: Historical Med  . Order #: [id number] Class: Normal  . Order #: [id number] Class: Normal  . Order #: [id number] Class: Historical Med  . Order #: [id number] Class: Historical Med  . Order #: [id number] Class: Normal  . Order #: [id number] Class: Normal  . Order #: [id number] Class: Normal  . Order #: [id number] Class: Normal  . Order #: [id number] Class: Normal    Allergies Lisinopril; Tape; Xopenex [levalbuterol]; Cefpodoxime; Codeine; Lipitor [atorvastatin]; Other; Penicillins; Ventolin [kdc:albuterol]; Anoro ellipta [umeclidinium-vilanterol]; Clindamycin; Hydrocodone-acetaminophen; Latex; and Levalbuterol hcl  Family History  Problem Relation Age of Onset  . Anemia Mother   . Emphysema Father   . Esophageal cancer Daughter     Social History Social History   Tobacco Use  . Smoking status: Former Smoker    Packs/day: 1.00    Years: 47.00    Pack years: 47.00    Types: Cigarettes    Last attempt to quit: 07/26/2007     Years since quitting: 10.5  . Smokeless tobacco: Never Used  Substance Use Topics  . Alcohol use: No  . Drug use: No    Review of Systems  All other systems negative except as documented in the HPI. All pertinent positives and negatives as reviewed in the HPI. ____________________________________________   PHYSICAL EXAM:  VITAL SIGNS: ED Triage Vitals  Enc Vitals Group     BP 02/21/18 1700 (!) 122/46     Pulse Rate 02/21/18 1700 75     Resp 02/21/18 1700 18     Temp 02/21/18 1700 98.4 F (36.9 C)     Temp Source 02/21/18 1700 Oral     SpO2 02/21/18 1700 96 %     Weight 02/21/18 1701 203 lb (92.1 kg)     Height 02/21/18 1701 5\' 6"  (1.676 m)    Constitutional: Alert and oriented. Well appearing and in no acute distress. Eyes: Conjunctivae are normal. PERRL. EOMI.  Head: Atraumatic. Nose: No congestion/rhinnorhea. Mouth/Throat: Mucous membranes are moist.  Oropharynx non-erythematous. Neck: No stridor.  No meningeal signs.   Cardiovascular: Normal rate, regular rhythm. Good peripheral circulation. Grossly normal heart sounds.   Respiratory: Normal respiratory effort.  No retractions. Lungs CTAB. Gastrointestinal: Soft and nontender. No distention.  Musculoskeletal: No lower extremity tenderness nor edema. No gross deformities of extremities. Neurologic:  Normal speech and language. No gross focal neurologic deficits are appreciated.  Skin:  Skin is warm, dry and intact. No rash noted.   ____________________________________________   LABS (all labs ordered are listed, but only abnormal results are displayed)  Labs Reviewed  CBC WITH DIFFERENTIAL/PLATELET  COMPREHENSIVE METABOLIC PANEL  TROPONIN I  BRAIN NATRIURETIC PEPTIDE  OCCULT BLOOD X 1 CARD TO LAB, STOOL  TYPE AND SCREEN   ____________________________________________  EKG   EKG Interpretation  Date/Time:  Wednesday February 21 2018 17:31:47 EDT Ventricular Rate:  72 PR Interval:    QRS Duration: 185 QT  Interval:  498 QTC Calculation: 546 R Axis:   -84 Text Interpretation:  Sinus rhythm Atrial premature complex RBBB and LAFB Probable left ventricular hypertrophy Baseline wander in lead(s) V4 similar to 7/4 Confirmed by Marily Memos [phone]) on 02/21/2018 5:57:16 PM Also confirmed by Marily Memos [phone]), editor Barbette Hair [phone])  on 02/22/2018 7:54:16 AM       ____________________________________________  RADIOLOGY  Dg Chest 2 View  Result Date: 02/21/2018 CLINICAL DATA:  Increasing shortness of breath EXAM: CHEST - 2 VIEW COMPARISON:  Plain film from 01/25/2018, chest CT from 01/30/2018 FINDINGS: Cardiac shadow is enlarged. Left pleural effusion with mild left basilar atelectasis is seen slightly increased when compare with the prior exam. The right lung is clear. Postsurgical changes are noted. No acute bony abnormality is seen. IMPRESSION: Increase in left basilar atelectasis and effusion. Electronically Signed   By: [name] M.D.   On: 02/21/2018 18:38    ____________________________________________   PROCEDURES  Procedure(s) performed:   Procedures   ____________________________________________   INITIAL IMPRESSION / ASSESSMENT AND PLAN / ED COURSE  Patient with worsening kidney function, mild hypotension at the facility however feels like she has more swelling her lower extremity's and she appears volume overloaded on exam.  Given some Lasix here but secondary to the tenuous nature of her kidney function and blood pressures will ask hospitalist to admit for further management.   Pertinent labs & imaging results that were available during my care of the patient were reviewed by me and considered in my medical decision making (see chart for details).  ____________________________________________  FINAL CLINICAL IMPRESSION(S) / ED DIAGNOSES  Final diagnoses:  Acute on chronic congestive heart failure, unspecified heart failure type (HCC)     MEDICATIONS GIVEN  DURING THIS VISIT:  Medications  amiodarone (PACERONE) tablet 200 mg (200 mg Oral Given 02/22/18 0927)  benzonatate (TESSALON) capsule 200 mg (has no administration in time range)  calcium-vitamin D (OSCAL WITH D) 500-200 MG-UNIT per tablet 2 tablet (2 tablets Oral Given 02/22/18 0927)  clonazePAM (KLONOPIN) tablet 0.5 mg (0.5 mg Oral Given 02/21/18 2228)  diclofenac sodium (VOLTAREN) 1 % transdermal gel 2 g (has no administration in time range)  ipratropium (ATROVENT) nebulizer solution 0.5 mg (0.5 mg Nebulization Given 02/22/18 1105)  isosorbide mononitrate (IMDUR) 24 hr tablet 90 mg (90 mg Oral Given 02/22/18 0927)  metoprolol tartrate (LOPRESSOR) tablet 50 mg (50 mg Oral Given 02/22/18 0927)  pantoprazole (PROTONIX) EC tablet 40 mg (40 mg Oral Given 02/22/18 0927)  potassium chloride SA (K-DUR,KLOR-CON) CR tablet 40 mEq (40 mEq Oral Given 02/22/18 0927)  Rivaroxaban (XARELTO) tablet 15 mg (has no administration in time range)  simvastatin (ZOCOR) tablet 20 mg (has no administration in time range)  spironolactone (ALDACTONE) tablet 25 mg (25 mg Oral Given 02/22/18 0927)  sodium chloride flush (NS) 0.9 % injection 3 mL (3 mLs Intravenous Given 02/22/18 0932)  sodium chloride flush (NS) 0.9 % injection 3 mL (has no administration in time range)  0.9 %  sodium chloride infusion (has no administration in time range)  acetaminophen (TYLENOL) tablet 650 mg (has no administration in time range)  ondansetron (ZOFRAN) injection 4 mg (has no administration in time range)  furosemide (LASIX) injection 80 mg (80 mg Intravenous Given 02/22/18 1046)  furosemide (LASIX) injection 80 mg (80 mg Intravenous Given 02/21/18 2034)  magnesium sulfate IVPB 1 g 100 mL (1 g Intravenous New Bag/Given 02/22/18 0012)     NEW OUTPATIENT MEDICATIONS STARTED DURING THIS VISIT:  New Prescriptions   No medications on file    Note:  This note was prepared with assistance of Dragon voice recognition software. Occasional wrong-word or  sound-a-like substitutions may have occurred due to the inherent limitations of voice recognition software.   Marily Memos, MD 02/22/18 [phone]

## 2018-02-21 NOTE — ED Triage Notes (Addendum)
Pt BIBA for c/o  Abnormal labs (hgb ) from [facility] , hgb was 11.9 on 07/18 and was 9.6 on 07/29  ;  Denies any source of bleeding ; pt alert and oriented x4 ; denies any CP or SOB ; denies any dark colored stools or dark colored emesis; pt does report generalized weakness

## 2018-02-21 NOTE — H&P (Signed)
History and Physical    [name] UJW:[id number] DOB: [birth year] DOA: 02/21/2018  PCP: Maurice Small, MD   Patient coming from: SNF   Chief Complaint: SOB, increased leg swelling, "doctors messing up my medications again"    HPI: [name] is a 77 y.o. female with medical history significant for COPD with chronic hypoxic respiratory failure, chronic diastolic CHF, chronic normocytic anemia, chronic kidney disease stage III, atrial fibrillation on Xarelto, and anxiety, now presenting to the emergency department from her SNF reporting increased shortness of breath, increased bilateral leg edema, and reported low hemoglobin on outpatient blood work.  Patient was admitted to the hospital earlier this month and treated for acute on chronic diastolic CHF, transfused red blood cells, and found to have a concerning lung nodule.  She was discharged to an SNF where she reports developing worsening dyspnea and bilateral leg edema over the past 1 to 2 weeks.  Per report of SNF personnel, antihypertensives and diuretic has been held several times recently due to low blood pressures.  She has followed up with pulmonology who is concerned that the lung nodule is an adenocarcinoma and has referred her to CT surgery for further evaluation.  Patient denies any melena or hematochezia, denies increase in her chronic mild cough, and denies fevers or chills.  She also denies chest pain.  ED Course: Upon arrival to the ED, patient is found to be afebrile, saturating well on her usual supplemental oxygen, and with vitals otherwise stable.  EKG features sinus rhythm with PAC, RBBB, and LAFB.  Chest x-ray is notable for increased left basilar atelectasis and effusion.  Chemistry panel is notable for slight hypokalemia and creatinine 1.35, up from 1.10 on 02/01/2018.  CBC features a normocytic anemia with hemoglobin of 9.8, down from 10.8 earlier this month.  Fecal occult blood testing is negative.  Patient was given 80  mg IV Lasix in the ED.  She remains hemodynamically stable and will be admitted for ongoing evaluation and management.  Review of Systems:  All other systems reviewed and apart from HPI, are negative.  Past Medical History:  Diagnosis Date  . Acute parotitis 01/05/2016  . Anxiety   . Bacteremia, escherichia coli   . Bursitis, shoulder    bilateral  . CHF (congestive heart failure) (HCC)    chronic diasotlic CHF  . Complication of anesthesia    "have trouble getting me awake sometimes; been ok latelhy" (09/24/2015)  . COPD (chronic obstructive pulmonary disease) (HCC)   . Coronary heart disease 2001   s/p CABG  . Depression   . DJD (degenerative joint disease)   . Dyspnea   . GERD (gastroesophageal reflux disease)   . H/O hiatal hernia   . History of rheumatic heart disease    X 3-LAST TIME WHEN PATIENT WAS 77 YRS OLD  . Hyperlipidemia   . Hypertension   . OA (osteoarthritis)   . On home oxygen therapy    "2L; 24/7" (09/24/2015)  . PUD (peptic ulcer disease)   . Pulmonary HTN (HCC)    PASP 50-60mmHg by [id number]  . Sleep apnea    intolerant to CPAP  . Thyroid nodule   . Urinary incontinence   . Urine incontinence   . Varicose veins     Past Surgical History:  Procedure Laterality Date  . BILATERAL SALPINGOOPHORECTOMY    . CARDIAC CATHETERIZATION  02/11/2008, 01/30/2004, 06/14/2000  . CARDIAC CATHETERIZATION N/A 09/24/2015   Procedure: Right Heart Cath;  Surgeon: Eliot Ford  Shirlee Latch, MD;  Location: [facility] INVASIVE CV LAB;  Service: Cardiovascular;  Laterality: N/A;  . CARDIAC CATHETERIZATION N/A 02/08/2016   Procedure: Right/Left Heart Cath and Coronary/Graft Angiography;  Surgeon: Laurey Morale, MD;  Location: [facility] INVASIVE CV LAB;  Service: Cardiovascular;  Laterality: N/A;  . CARDIOVERSION N/A 06/12/2016   Procedure: CARDIOVERSION;  Surgeon: Duke Salvia, MD;  Location: [facility] OR;  Service: Cardiovascular;  Laterality: N/A;  . CORONARY ARTERY BYPASS GRAFT  06/21/2000   x4  .  INCONTINENCE SURGERY     "tacked"  . TEE WITHOUT CARDIOVERSION N/A 09/24/2015   Procedure: TRANSESOPHAGEAL ECHOCARDIOGRAM (TEE);  Surgeon: Laurey Morale, MD;  Location: [facility] ENDOSCOPY;  Service: Cardiovascular;  Laterality: N/A;  . THYROIDECTOMY, PARTIAL  2010   Dr Michaell Cowing  . TOTAL ABDOMINAL HYSTERECTOMY  1990   1990  . TUBAL LIGATION    . VAGINAL DELIVERY  x4     reports that she quit smoking about 10 years ago. Her smoking use included cigarettes. She has a 47.00 pack-year smoking history. She has never used smokeless tobacco. She reports that she does not drink alcohol or use drugs.  Allergies  Allergen Reactions  . Lisinopril Cough    Developed persistent dry cough for a month.   . Tape Other (See Comments)    Bleeding  . Cefpodoxime Other (See Comments)    Yeast infections   . Codeine Other (See Comments)    Anxious/ jittery  . Lipitor [Atorvastatin] Other (See Comments)    Made joint start hurting/ Muscle aches  . Other Swelling    Venholin HFA  . Penicillins Other (See Comments)    childhood reaction Has patient had a PCN reaction causing immediate rash, facial/tongue/throat swelling, SOB or lightheadedness with hypotension: unknown Has patient had a PCN reaction causing severe rash involving mucus membranes or skin necrosis: No Has patient had a PCN reaction that required hospitalization No Has patient had a PCN reaction occurring within the last 10 years: No If all of the above answers are "NO", then may proceed with Cephalosporin use.   Terald Sleeper [Kdc:Albuterol] Other (See Comments)    "jittery"  . Xopenex [Levalbuterol] Other (See Comments)    Made mouth and throat sore  . Anoro Ellipta [Umeclidinium-Vilanterol] Itching  . Clindamycin Other (See Comments)    Unknown   . Hydrocodone-Acetaminophen Anxiety  . Latex Itching and Rash    Family History  Problem Relation Age of Onset  . Anemia Mother   . Emphysema Father   . Esophageal cancer Daughter       Prior to Admission medications   Medication Sig Start Date End Date Taking? Authorizing Provider  acetaminophen (TYLENOL) 500 MG tablet Take 1,000 mg by mouth 2 (two) times daily as needed for headache.    Yes [provider]  amiodarone (PACERONE) 200 MG tablet Take 1 tablet (200 mg total) by mouth daily. 12/11/17  Yes Laurey Morale, MD  benzonatate (TESSALON) 200 MG capsule Take 1 capsule (200 mg total) by mouth 3 (three) times daily as needed for cough. 08/26/16  Yes Clegg, Amy D, NP  calcium-vitamin D (OSCAL WITH D) 500-200 MG-UNIT per tablet Take 2 tablets by mouth daily.    Yes [provider]  clonazePAM (KLONOPIN) 0.5 MG tablet Take 1 tablet (0.5 mg total) by mouth at bedtime. 02/01/18  Yes Meredeth Ide, MD  diclofenac sodium (VOLTAREN) 1 % GEL Apply 2 g topically daily as needed (pain). 02/01/18  Yes Meredeth Ide,  MD  docusate sodium (COLACE) 100 MG capsule Take 200 mg by mouth as needed for mild constipation.   Yes [provider]  ferrous gluconate (FERGON) 324 MG tablet Take 324 mg by mouth daily.   Yes [provider]  ipratropium (ATROVENT) 0.02 % nebulizer solution Take 2.5 mLs (0.5 mg total) by nebulization 4 (four) times daily. Dx: J44.9 01/15/18  Yes Parrett, Tammy S, NP  isosorbide mononitrate (IMDUR) 30 MG 24 hr tablet Take 90 mg by mouth daily.   Yes [provider]  loratadine (CLARITIN) 10 MG tablet Take 10 mg by mouth daily.    Yes [provider]  metoprolol tartrate (LOPRESSOR) 50 MG tablet Take 1 tablet (50 mg total) by mouth 2 (two) times daily. 09/01/17  Yes Laurey Morale, MD  NITROSTAT 0.4 MG SL tablet place 1 tablet under the tongue if needed every 5 minutes for chest pain for 3 doses IF NO RELIEF AFTER 3RD DOSE CALL PRESCRIBER OR 911. 01/21/16  Yes Turner, Cornelious Bryant, MD  omeprazole (PRILOSEC) 40 MG capsule Take 40 mg by mouth daily. 11/22/17  Yes [provider]  potassium chloride SA (K-DUR,KLOR-CON) 20  MEQ tablet TAKE 2 TABLETS BY MOUTH TWO TIMES DAILY Patient taking differently: TAKE 40 MEQ BY MOUTH TWO TIMES DAILY 11/28/16  Yes Bensimhon, Bevelyn Buckles, MD  Rivaroxaban (XARELTO) 15 MG TABS tablet Take 1 tablet (15 mg total) by mouth daily with supper. 10/26/17  Yes Laurey Morale, MD  simvastatin (ZOCOR) 20 MG tablet TAKE 3 TABLETS BY MOUTH  DAILY AT 6 PM 05/30/17  Yes Laurey Morale, MD  spironolactone (ALDACTONE) 25 MG tablet TAKE 1 TABLET BY MOUTH  DAILY Patient taking differently: TAKE 25MG  BY MOUTH  DAILY 05/30/17  Yes Laurey Morale, MD  torsemide (DEMADEX) 20 MG tablet Take 4 tablets (80 mg total) by mouth 2 (two) times daily. Patient taking differently: Take 40-80 mg by mouth See admin instructions. Take 4 tablets (80mg ) every morning. Then take 2 tablets (40mg ) every evening. 02/12/18  Yes Laurey Morale, MD  OXYGEN Inhale 2-3 L into the lungs continuous. At rest 2 liters  Moving around 3 liters    [provider]    Physical Exam: Vitals:   02/21/18 1915 02/21/18 1945 02/21/18 2030 02/21/18 2045  BP: (!) 119/44 (!) 117/55  126/63  Pulse: 69 72  78  Resp: (!) 22  (!) 26   Temp:      TempSrc:      SpO2: 96% 94% 94% 91%  Weight:      Height:          Constitutional: Mild dyspnea with speech, not in acute distress   Eyes: PERTLA, lids and conjunctivae normal ENMT: Mucous membranes are moist. Posterior pharynx clear of any exudate or lesions.   Neck: normal, supple, no masses, no thyromegaly Respiratory: Diminished at left base, fine rales bilaterally. Mild tachypnea, dyspnea with speech. No accessory muscle use.  Cardiovascular: S1 & S2 heard, regular rate and rhythm. 2+ pretibial pitting edema bilaterally. Abdomen: No distension, no tenderness, soft. Bowel sounds normal.  Musculoskeletal: no clubbing / cyanosis. No joint deformity upper and lower extremities.    Skin: no significant rashes, lesions, ulcers. Warm, dry, well-perfused. Neurologic: No facial asymmetry.  Sensation intact. Strength 5/5 in all 4 limbs.  Psychiatric: Alert and oriented to person, place, and situation. Calm, cooperative.     Labs on Admission: I have personally reviewed following labs and imaging studies  CBC: Recent Labs  Lab 02/21/18 1700  WBC 6.7  NEUTROABS 4.6  HGB 9.8*  HCT 32.3*  MCV 98.5  PLT 176   Basic Metabolic Panel: Recent Labs  Lab 02/21/18 1700  NA 141  K 3.4*  CL 95*  CO2 35*  GLUCOSE 99  BUN 26*  CREATININE 1.35*  CALCIUM 9.3   GFR: Estimated Creatinine Clearance: 39.3 mL/min (A) (by C-G formula based on SCr of 1.35 mg/dL (H)). Liver Function Tests: Recent Labs  Lab 02/21/18 1700  AST 16  ALT 18  ALKPHOS 66  BILITOT 0.8  PROT 6.1*  ALBUMIN 3.3*   No results for input(s): LIPASE, AMYLASE in the last 168 hours. No results for input(s): AMMONIA in the last 168 hours. Coagulation Profile: No results for input(s): INR, PROTIME in the last 168 hours. Cardiac Enzymes: Recent Labs  Lab 02/21/18 1700  TROPONINI <0.03   BNP (last 3 results) No results for input(s): PROBNP in the last 8760 hours. HbA1C: No results for input(s): HGBA1C in the last 72 hours. CBG: No results for input(s): GLUCAP in the last 168 hours. Lipid Profile: No results for input(s): CHOL, HDL, LDLCALC, TRIG, CHOLHDL, LDLDIRECT in the last 72 hours. Thyroid Function Tests: No results for input(s): TSH, T4TOTAL, FREET4, T3FREE, THYROIDAB in the last 72 hours. Anemia Panel: No results for input(s): VITAMINB12, FOLATE, FERRITIN, TIBC, IRON, RETICCTPCT in the last 72 hours. Urine analysis:    Component Value Date/Time   COLORURINE STRAW (A) 02/21/2018 2038   APPEARANCEUR CLEAR 02/21/2018 2038   LABSPEC 1.006 02/21/2018 2038   PHURINE 6.0 02/21/2018 2038   GLUCOSEU NEGATIVE 02/21/2018 2038   HGBUR NEGATIVE 02/21/2018 2038   BILIRUBINUR NEGATIVE 02/21/2018 2038   KETONESUR NEGATIVE 02/21/2018 2038   PROTEINUR NEGATIVE 02/21/2018 2038   NITRITE NEGATIVE  02/21/2018 2038   LEUKOCYTESUR NEGATIVE 02/21/2018 2038   Sepsis Labs: @LABRCNTIP (procalcitonin:4,lacticidven:4) )No results found for this or any previous visit (from the past 240 hour(s)).   Radiological Exams on Admission: Dg Chest 2 View  Result Date: 02/21/2018 CLINICAL DATA:  Increasing shortness of breath EXAM: CHEST - 2 VIEW COMPARISON:  Plain film from 01/25/2018, chest CT from 01/30/2018 FINDINGS: Cardiac shadow is enlarged. Left pleural effusion with mild left basilar atelectasis is seen slightly increased when compare with the prior exam. The right lung is clear. Postsurgical changes are noted. No acute bony abnormality is seen. IMPRESSION: Increase in left basilar atelectasis and effusion. Electronically Signed   By: [name] M.D.   On: 02/21/2018 18:38    EKG: Independently reviewed. Sinus rhythm, PAC, RBBB, LAFB.   Assessment/Plan   1. Acute on chronic diastolic CHF  - Presents with worsening SOB and bilateral leg edema, developing over the past 1-2 weeks and likely secondary to reduced dosing and held doses of diuretic at SNF d/t low BP readings - She is SOB at rest on admission with rales and peripheral edema on exam and ~5 lb wt gain  - She had echo earlier this month with preserved EF  - Treated in ED with Lasix 80 mg IV  - Continue diuresis with Lasix 80 mg IV q12h as BP allows  - Continue cardiac monitoring, SLIV, follow daily wt and I/O's, continue beta-blocker as tolerated   2. COPD; chronic hypoxic respiratory failure  - No wheezing or increased cough on admission  - Continue nebs and supplemental O2    3. CKD stage III  - SCr is 1.35 on admission, up from apparent baseline of ~  1.10  - Renally-dose medications, follow daily chem panel during diuresis   4. Normocytic anemia  - Hgb is 9.8 on admission, down from 10.8 on 01/28/18  - She denies bleeding and FOBT is negative  - Likely a dilutional component, but she did require transfusion earlier this month    - Trend H&H; she will continue iron supplementation    5. 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 Atrial fibrillation  - In a sinus rhythm on admission  - CHADS-VASc 4 (age x2, gender, CHF)  - Continue Xarelto, amiodarone, metoprolol     DVT prophylaxis: Xarelto  Code Status: DNR  Family Communication: Daughter updated at bedside Consults called: None Admission status: Inpatient     Briscoe Deutscher, MD [facility] Pager [phone]  If 7PM-7AM, please contact night-coverage [email or web address] Password [email or web address]  02/21/2018, 9:16 PM

## 2018-02-21 NOTE — Progress Notes (Signed)
Pt just arrived floor and wants her code status changed from DNR to limited code.

## 2018-02-22 DIAGNOSIS — I482 Chronic atrial fibrillation: Secondary | ICD-10-CM

## 2018-02-22 DIAGNOSIS — D649 Anemia, unspecified: Secondary | ICD-10-CM

## 2018-02-22 DIAGNOSIS — I5033 Acute on chronic diastolic (congestive) heart failure: Secondary | ICD-10-CM

## 2018-02-22 DIAGNOSIS — J449 Chronic obstructive pulmonary disease, unspecified: Secondary | ICD-10-CM

## 2018-02-22 DIAGNOSIS — J9611 Chronic respiratory failure with hypoxia: Secondary | ICD-10-CM

## 2018-02-22 DIAGNOSIS — I10 Essential (primary) hypertension: Secondary | ICD-10-CM

## 2018-02-22 LAB — CBC
HCT: 35.1 % — ABNORMAL LOW (ref 36.0–46.0)
HEMOGLOBIN: 10.4 g/dL — AB (ref 12.0–15.0)
MCH: 29.5 pg (ref 26.0–34.0)
MCHC: 29.6 g/dL — ABNORMAL LOW (ref 30.0–36.0)
MCV: 99.4 fL (ref 78.0–100.0)
PLATELETS: 170 10*3/uL (ref 150–400)
RBC: 3.53 MIL/uL — AB (ref 3.87–5.11)
RDW: 16.8 % — ABNORMAL HIGH (ref 11.5–15.5)
WBC: 7.5 10*3/uL (ref 4.0–10.5)

## 2018-02-22 LAB — BASIC METABOLIC PANEL
ANION GAP: 11 (ref 5–15)
BUN: 22 mg/dL (ref 8–23)
CALCIUM: 9.1 mg/dL (ref 8.9–10.3)
CO2: 36 mmol/L — AB (ref 22–32)
Chloride: 97 mmol/L — ABNORMAL LOW (ref 98–111)
Creatinine, Ser: 1.14 mg/dL — ABNORMAL HIGH (ref 0.44–1.00)
GFR calc non Af Amer: 45 mL/min — ABNORMAL LOW (ref >60)
GFR, EST AFRICAN AMERICAN: 52 mL/min — AB (ref >60)
Glucose, Bld: 124 mg/dL — ABNORMAL HIGH (ref 70–99)
Potassium: 3.2 mmol/L — ABNORMAL LOW (ref 3.5–5.1)
Sodium: 144 mmol/L (ref 135–145)

## 2018-02-22 LAB — MAGNESIUM: Magnesium: 2.5 mg/dL — ABNORMAL HIGH (ref 1.7–2.4)

## 2018-02-22 LAB — MRSA PCR SCREENING: MRSA BY PCR: NEGATIVE

## 2018-02-22 MED ORDER — FUROSEMIDE 10 MG/ML IJ SOLN
80.0000 mg | Freq: Two times a day (BID) | INTRAMUSCULAR | Status: DC
  Administered 2018-02-22 (×2): 80 mg via INTRAVENOUS
  Filled 2018-02-22 (×2): qty 8

## 2018-02-22 MED ORDER — FUROSEMIDE 10 MG/ML IJ SOLN: 80.0000 mg | Freq: Two times a day (BID) | INTRAMUSCULAR | Status: DC

## 2018-02-22 NOTE — Progress Notes (Signed)
PROGRESS NOTE    [name]  LGX:[id number] DOB: 1940-12-24 DOA: 02/21/2018 PCP: Maurice Small, MD      Brief Narrative:  [name] is a 77 y.o. 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   Had recently been admitted to the hospital for dyspnea.  Treated with IV steroids and nebs as well as IV Lasix, improved and was discharged (although she reports leg swelling *worsened* in hospital).  For about a week at [facility] she was doing well, then last 7 days, started to have worsening dyspnea, weight gain.      Assessment & Plan:  Acute on chronic diasotlic CHF Net negative -800cc overnight from Lasix 80.  BNP lower than previous but effusion on CXR, 3+ edema.  EF 65% earlier this month. -Hold home torsemide 80 BID -Furosemide 80 mg IV twice a day  -Continue BB, spiro -K supplement -Strict I/Os, daily weights, telemetry  -Daily monitoring renal function -Consult to CHF team, appreciate cares -Low threshold to stop Imdur if needed for BP   Coronary artery disease secondary prevention Hypertension BP normal. -Continue Imdur, metoprolol -Continue statin  Paroxysmal atrial fibrillation CHA2DS2-Vasc 6, on Xarelto. -Continue Xarelto -Continue amiodarone -Continue metoprolol  Severe COPD Chronic hypoxic respiratory failure No active disease, no wheezing.  Patient is evidently intolerant to LAMAs, LABAs, and ICSs. -Continue Atrovent QID  Pulmonary hypertension  Normocytic anemia, chronic iron deficiency There was a report that her Hgb had dropped, this was incorrect, it is stable relative to her baseline ~10 g/dL.  Hypokalemia -Supplement K  Other medications -Continue PPI - Continue clonazepam   DVT prophylaxis: N/A on Xarelto Code Status: FULL Family Communication: Son at bedside MDM and disposition Plan: The  below labs and imaging reports were reviewed and summarized above.    The patient was admitted with recurrent CHF.  Will continue IV diuresis, as she is dyspneic at rest just with talking, still has rales, still with pleural effusion on CXR and 3+ edema.  In setting of her hypotension with diuretic at home, she is at risk of decompensating if aggressive diuresis were attemtped as an outpatient.   Consultants:   Cardiology  Procedures:   None  Antimicrobials:   None    Subjective: Dyspneic, coughing, dry cough.  No fever, sputum.  Leg swelling no change.  Orthopnea is chronic.  She feels very out of breath.  Objective: Vitals:   02/21/18 2045 02/21/18 2139 02/22/18 0412 02/22/18 0416  BP: 126/63 (!) 116/47  (!) 113/56  Pulse: 78 74  79  Resp:  18  (!) 28  Temp:  98.2 F (36.8 C)  98.2 F (36.8 C)  TempSrc:  Oral  Oral  SpO2: 91% 95%  94%  Weight:  91 kg (200 lb 9.9 oz) 89.9 kg (198 lb 3.1 oz)   Height:  5\' 6"  (1.676 m)      Intake/Output Summary (Last 24 hours) at 02/22/2018 0835 Last data filed at 02/22/2018 8144 Gross per 24 hour  Intake 480 ml  Output 1300 ml  Net -820 ml   Filed Weights   02/21/18 1701 02/21/18 2139 02/22/18 0412  Weight: 92.1 kg (203 lb) 91 kg (200 lb 9.9 oz) 89.9 kg (198 lb 3.1 oz)    Examination: General appearance: Obese adult female, alert and in mild respiratory distress.  Iteractive, sitting on edge of bed. HEENT: Anicteric, conjunctiva pink, lids and lashes normal. No nasal deformity, discharge, epistaxis.  Lips moist, dentures looese.  OP moist, no oral lesions, hearing normal.   Skin: Warm and dry.  no jaundice.  No suspicious rashes or lesions. Cardiac: RRR, nl S1-S2, gallop noted.  Capillary refill is brisk.  JVP not visible.  3+ LE edema to knee.  Radia  pulses 2+ and symmetric. Respiratory: Dyspneic just with talking.  Rales at bilateral bases.  Diminished breath sounds overall, no wheezes. Abdomen: Abdomen soft.  no TTP, or  guarding. No ascites, distension, hepatosplenomegaly.   MSK: No deformities or effusions. Neuro: Awake and alert.  EOMI, moves all extremities. Speech fluent.    Psych: Sensorium intact and responding to questions, attention normal. Affect normal.  Judgment and insight appear normal.    Data Reviewed: I have personally reviewed following labs and imaging studies:  CBC: Recent Labs  Lab 02/21/18 1700 02/22/18 0323  WBC 6.7 7.5  NEUTROABS 4.6  --   HGB 9.8* 10.4*  HCT 32.3* 35.1*  MCV 98.5 99.4  PLT 176 170   Basic Metabolic Panel: Recent Labs  Lab 02/21/18 1700 02/22/18 0323  NA 141 144  K 3.4* 3.2*  CL 95* 97*  CO2 35* 36*  GLUCOSE 99 124*  BUN 26* 22  CREATININE 1.35* 1.14*  CALCIUM 9.3 9.1  MG  --  2.5*   GFR: Estimated Creatinine Clearance: 45.9 mL/min (A) (by C-G formula based on SCr of 1.14 mg/dL (H)). Liver Function Tests: Recent Labs  Lab 02/21/18 1700  AST 16  ALT 18  ALKPHOS 66  BILITOT 0.8  PROT 6.1*  ALBUMIN 3.3*   No results for input(s): LIPASE, AMYLASE in the last 168 hours. No results for input(s): AMMONIA in the last 168 hours. Coagulation Profile: No results for input(s): INR, PROTIME in the last 168 hours. Cardiac Enzymes: Recent Labs  Lab 02/21/18 1700  TROPONINI <0.03   BNP (last 3 results) No results for input(s): PROBNP in the last 8760 hours. HbA1C: No results for input(s): HGBA1C in the last 72 hours. CBG: No results for input(s): GLUCAP in the last 168 hours. Lipid Profile: No results for input(s): CHOL, HDL, LDLCALC, TRIG, CHOLHDL, LDLDIRECT in the last 72 hours. Thyroid Function Tests: No results for input(s): TSH, T4TOTAL, FREET4, T3FREE, THYROIDAB in the last 72 hours. Anemia Panel: No results for input(s): VITAMINB12, FOLATE, FERRITIN, TIBC, IRON, RETICCTPCT in the last 72 hours. Urine analysis:    Component Value Date/Time   COLORURINE STRAW (A) 02/21/2018 2038   APPEARANCEUR CLEAR 02/21/2018 2038   LABSPEC  1.006 02/21/2018 2038   PHURINE 6.0 02/21/2018 2038   GLUCOSEU NEGATIVE 02/21/2018 2038   HGBUR NEGATIVE 02/21/2018 2038   BILIRUBINUR NEGATIVE 02/21/2018 2038   KETONESUR NEGATIVE 02/21/2018 2038   PROTEINUR NEGATIVE 02/21/2018 2038   NITRITE NEGATIVE 02/21/2018 2038   LEUKOCYTESUR NEGATIVE 02/21/2018 2038   Sepsis Labs: @LABRCNTIP (procalcitonin:4,lacticacidven:4)  ) Recent Results (from the past 240 hour(s))  MRSA PCR Screening     Status: None   Collection Time: 02/21/18  9:38 PM  Result Value Ref Range Status   MRSA by PCR NEGATIVE NEGATIVE Final    Comment:        The GeneXpert MRSA Assay (FDA approved for NASAL specimens only), is one component of a comprehensive MRSA colonization surveillance program. It is not intended to diagnose MRSA infection nor to guide or monitor treatment for MRSA infections. Performed at [facility], [address] [place], [place] [address]          Radiology Studies: Dg Chest 2 View  Result Date: 02/21/2018 CLINICAL DATA:  Increasing shortness of breath EXAM: CHEST - 2 VIEW COMPARISON:  Plain film from 01/25/2018, chest CT from 01/30/2018 FINDINGS: Cardiac shadow is enlarged. Left pleural effusion with mild left basilar atelectasis is seen slightly increased when compare with the prior exam. The right lung is clear. Postsurgical changes are noted. No acute bony abnormality is seen. IMPRESSION: Increase in left basilar atelectasis and effusion. Electronically Signed   By: [name] M.D.   On: 02/21/2018 18:38        Scheduled Meds: . amiodarone  200 mg Oral Daily  . calcium-vitamin D  2 tablet Oral Q breakfast  . clonazePAM  0.5 mg Oral QHS  . furosemide  80 mg Intravenous BID  . ipratropium  0.5 mg Nebulization QID  . isosorbide mononitrate  90 mg Oral Daily  . metoprolol tartrate  50 mg Oral BID  . pantoprazole  40 mg Oral Daily  . potassium chloride SA  40 mEq Oral BID  . Rivaroxaban  15 mg Oral Q supper  .  simvastatin  20 mg Oral q1800  . sodium chloride flush  3 mL Intravenous Q12H  . spironolactone  25 mg Oral Daily   Continuous Infusions: . sodium chloride       LOS: 1 day    Time spent: 25 mnutes    Alberteen Sam, MD [facility] 02/22/2018, 8:35 AM     Pager [phone] --- please page though AMION:  [email or web address] Password [email or web address] If 7PM-7AM, please contact night-coverage

## 2018-02-22 NOTE — Clinical Social Work Note (Signed)
CSW acknowledges consult for advanced directives. Please consult chaplain.  CSW signing off. Consult again if any social work needs arise.  Pallie Swigert, CSW [phone]  

## 2018-02-22 NOTE — Progress Notes (Signed)
RN gave pt a pink container with her label on it for her upper dentures.

## 2018-02-22 NOTE — Care Management Note (Signed)
Case Management Note  Patient Details  Name: [name] MRN: [id number] Date of Birth: 11-06-1940  Subjective/Objective:  CHF                 Action/Plan: Admitted 3 times in 6 months; patient arrived from nursing facility/ rehab; SW consulted for for placement/ continuation of her rehab; CM will continue to follow for progression of care.  Expected Discharge Date:   possibly 02/25/2018               Expected Discharge Plan:  Skilled Nursing Facility  In-House Referral:  Clinical Social Work  Discharge planning Services  CM Consult  Status of Service:  In process, will continue to follow  Reola Mosher [phone] 02/22/2018, 10:28 AM

## 2018-02-22 NOTE — Progress Notes (Signed)
Orthopedic Tech Progress Note Patient Details:  [name] Nov 06, 1940 [id number]  Ortho Devices Type of Ortho Device: Roland Rack boot   Post Interventions Patient Tolerated: Refused intervention   Saul Fordyce 02/22/2018, 4:38 PM

## 2018-02-22 NOTE — Plan of Care (Signed)

## 2018-02-22 NOTE — Clinical Social Work Note (Signed)
Clinical Social Work Assessment  Patient Details  Name: [name] MRN: [id number] Date of Birth: July 22, 1941  Date of referral:  02/22/18               Reason for consult:  Discharge Planning                Permission sought to share information with:    Permission granted to share information::  No  Name::        Agency::     Relationship::     Contact Information:     Housing/Transportation Living arrangements for the past 2 months:  [place], [place] of Information:  Medical Team, Patient, Facility Patient Interpreter Needed:  None Criminal Activity/Legal Involvement Pertinent to Current Situation/Hospitalization:  No - Comment as needed Significant Relationships:  Adult Children, Spouse Lives with:  Spouse Do you feel safe going back to the place where you live?  Yes Need for family participation in patient care:  Yes (Comment)  Care giving concerns:  Patient is a short-term rehab resident at [facility].   Social Worker assessment / plan:  CSW met with patient. No supports at bedside. CSW is familiar with patient from last admission. Patient was at [facility] from 7/11-7/31 (20 days). She does not have secondary insurance to cover daily copay. Patient does not want to return to SNF at discharge, explaining concerns with care. Patient agreeable to returning home with [place] at discharge. RNCM notified. CSW signing off as social work intervention is no longer needed.  Employment status:  Retired Nurse, adult PT Recommendations:  Not assessed at this time Information / Referral to community resources:  [place]  Patient/Family's Response to care:  Patient wants to return home with home health at discharge. Patient's family supportive and involved in patient's care. Patient appreciated social work intervention.  Patient/Family's Understanding of and Emotional Response to  Diagnosis, Current Treatment, and Prognosis:  Patient has a good understanding of the reason for admission and social work consult. Patient appears happy with hospital care.  Emotional Assessment Appearance:  Appears stated age Attitude/Demeanor/Rapport:  Engaged, Gracious Affect (typically observed):  Accepting, Appropriate, Calm, Pleasant Orientation:  Oriented to Self, Oriented to Place, Oriented to  Time, Oriented to Situation Alcohol / Substance use:  Never Used Psych involvement (Current and /or in the community):  No (Comment)  Discharge Needs  Concerns to be addressed:  Care Coordination Readmission within the last 30 days:  Yes Current discharge risk:  None Barriers to Discharge:  Continued Medical Work up   Candie Chroman, LCSW 02/22/2018, 1:12 PM

## 2018-02-22 NOTE — Progress Notes (Signed)
RN applied tegaderm to small skin tear on pt's left inner wrist. Pt states the skin tear occurred from the tape used after lab draw.

## 2018-02-22 NOTE — Progress Notes (Signed)
Responded to [facility] to assist patient with AD.  Patient said she already had one done years ago. But her lawyer did not put on it what she wanted.  When her daughter come she will bring document and she will consider redoing it.  When family arrives she will have chaplain paged.  Chaplain available as needed.  [name], [place], [facility], Pager [phone]

## 2018-02-22 NOTE — Care Management Note (Signed)
Case Management Note  Patient Details  Name: [name] MRN: [id number] Date of Birth: Sep 10, 1940  Subjective/Objective:   CHF                Action/Plan: Received message from Gustavo Lah, patient is refusing SNF at this time and plans to return home with [facility] services provided by [facility]; referral placed as requested. PCP is Dr Maurice Small; has private insurance with [facility] with prescription drug coverage; pharmacy of choice is [facility] and [facility]; patient reports no problem getting her medication; DME - Oxygen through [employer], walker and wheelchair at home. CM will continue to follow for progression of care.  Expected Discharge Date:    possibly 02/25/2018              Expected Discharge Plan:  Home w Home Health Services  In-House Referral:  Clinical Social Work  Discharge planning Services  CM Consult  Choice offered to:  Patient  HH Arranged:  RN, Disease Management, PT, Nurse's Aide HH Agency:  [facility]  Status of Service:  In process, will continue to follow  Reola Mosher [phone] 02/22/2018, 1:49 PM

## 2018-02-22 NOTE — Consult Note (Addendum)
Advanced Heart Failure Team Consult Note   Primary Physician: Maurice Small, MD PCP-Cardiologist:  No primary care provider on file.  Reason for Consultation: SOB  HPI:    [name] is seen today for evaluation of SOB at the request of Dr Maryfrances Bunnell.   [name] is a77 y.o.female with a history of chronic diastolic CHF, moderate pulmonary HTN, HTN, CAD s/p CABG, COPD on home oxygen, and dyslipidemia   She was admitted in 6/19 with COPD exacerbation.  Diuretic was held while in hospital and she developed volume overload requiring IV diuresis.  She was discharged to SNF but only stayed 2 days.    Seen in HF clinic 01/12/18. She was more SOB and weight was up 2 lbs, but REDS vest reading was 32%, indicating that she did NOT have volume overload. She was supposed to follow up in 10 days, but did not make appointment. Follow up with pulmonary was arranged.   She saw pulmonary 01/15/18 and was thought to have COPD exacerbation. Ipratropium neb was added. She was instructed to finish doxy and prednisone.   Seen in ED 01/18/18 for SOB. O2 sats stable. Not thought to be volume overloaded. No changes were made.   Admitted 7/4-7/11/19 with hypoxic respiratory failure. Diuresed with IV lasix, then transitioned to torsemide 80 mg BID. Treated with IV steroids for COPD exacerbation and transitioned to prednisone. She received IV iron and 2 units PRBCs. DC weight 197 lbs. DCd to SNF for rehab.  Presented to [facility] 02/21/18 with worsening SOB for 2 weeks. Pt reports that her diuretics were held for SBP 100-110s several times at SNF. She was also not getting the correct nebulizer. She has been feeling weak, but denies CP, cough, fever, or chills. She has chronic orthopnea and BLE edema that is unchanged. She gets dizzy and weak with exerting herself and attributes this to her oxygen level dropping. Appetite and energy level have been fine. Getting low salt diet at [facility]. Meds provided through  SNF. Weights at [facility] place have been stable ~203 lbs.   Pertinent admission labs include: K 3.4, creatinine 1.35, hemoglobin 9.8, BNP 253 (previously 175-856), WBC 6.7, UA negative, hemoccult negative CXR: increase in left basilar atelectasis and effusion  Received 80 mg IV lasix x1 with -800 mls. Weight down 5 lbs to 198 lbs today. She feels a little better. Still SOB with minimal exertion. Denies CP.   Echo 7/19: EF 60-65%, moderate concentric hypertrophy, mild AI, mild to mod MR, moderate mitral stenosis with mean gradient 8 mmHg, Valve area by pressure half-time: 1.58 cm^2., diastolic flattening of ventricular septum, LA and RA severely dilated, RV mildly dilated, severe TR, PA peak pressure 62 mm Hg  Echo (2/19): EF 55-60% with septal flattening and mild RV dilation, moderate TR, mild AI, mild-moderate mitral stenosis mean gradient 6 mmHg, PHT 1.33 cm^2.   RHC 09/24/15 (did not do LHC and cross valve to assess mitral stenosis due to aortic valve vegetation): Hemodynamics (mmHg) RA mean 16 RV 65/14 PA 71/28, mean 45 PCWP mean 32 Cardiac Output (Fick) 4.85  Cardiac Index (Fick) 2.51 PVR 2.68 WU  LHC/RHC (7/17): 50% pLAD, patent LIMA-LAD, totally occluded PLOM, patent SVG-PLOM, AV LCx distal to PLOM with 90% stenosis (small caliber), totally occluded PLV, patent SVG-PLV.  RA mean 5 PA 43/13 PCWP mean 17 CI 2.7 PVR 1.9 WU Transient CHB when LV was entered, so unable to measure gradient across MV.   Review of systems complete  and found to be negative unless listed in HPI.   Home Medications Prior to Admission medications   Medication Sig Start Date End Date Taking? Authorizing Provider  acetaminophen (TYLENOL) 500 MG tablet Take 1,000 mg by mouth 2 (two) times daily as needed for headache.    Yes [provider]  amiodarone (PACERONE) 200 MG tablet Take 1 tablet (200 mg total) by mouth daily. 12/11/17  Yes Laurey Morale, MD  benzonatate (TESSALON) 200 MG capsule Take 1  capsule (200 mg total) by mouth 3 (three) times daily as needed for cough. 08/26/16  Yes Clegg, Amy D, NP  calcium-vitamin D (OSCAL WITH D) 500-200 MG-UNIT per tablet Take 2 tablets by mouth daily.    Yes [provider]  clonazePAM (KLONOPIN) 0.5 MG tablet Take 1 tablet (0.5 mg total) by mouth at bedtime. 02/01/18  Yes Meredeth Ide, MD  diclofenac sodium (VOLTAREN) 1 % GEL Apply 2 g topically daily as needed (pain). 02/01/18  Yes Meredeth Ide, MD  docusate sodium (COLACE) 100 MG capsule Take 200 mg by mouth as needed for mild constipation.   Yes [provider]  ferrous gluconate (FERGON) 324 MG tablet Take 324 mg by mouth daily.   Yes [provider]  ipratropium (ATROVENT) 0.02 % nebulizer solution Take 2.5 mLs (0.5 mg total) by nebulization 4 (four) times daily. Dx: J44.9 01/15/18  Yes Parrett, Tammy S, NP  isosorbide mononitrate (IMDUR) 30 MG 24 hr tablet Take 90 mg by mouth daily.   Yes [provider]  loratadine (CLARITIN) 10 MG tablet Take 10 mg by mouth daily.    Yes [provider]  metoprolol tartrate (LOPRESSOR) 50 MG tablet Take 1 tablet (50 mg total) by mouth 2 (two) times daily. 09/01/17  Yes Laurey Morale, MD  NITROSTAT 0.4 MG SL tablet place 1 tablet under the tongue if needed every 5 minutes for chest pain for 3 doses IF NO RELIEF AFTER 3RD DOSE CALL PRESCRIBER OR 911. 01/21/16  Yes Turner, Cornelious Bryant, MD  omeprazole (PRILOSEC) 40 MG capsule Take 40 mg by mouth daily. 11/22/17  Yes [provider]  potassium chloride SA (K-DUR,KLOR-CON) 20 MEQ tablet TAKE 2 TABLETS BY MOUTH TWO TIMES DAILY Patient taking differently: TAKE 40 MEQ BY MOUTH TWO TIMES DAILY 11/28/16  Yes Bensimhon, Bevelyn Buckles, MD  Rivaroxaban (XARELTO) 15 MG TABS tablet Take 1 tablet (15 mg total) by mouth daily with supper. 10/26/17  Yes Laurey Morale, MD  simvastatin (ZOCOR) 20 MG tablet TAKE 3 TABLETS BY MOUTH  DAILY AT 6 PM 05/30/17  Yes Laurey Morale, MD  spironolactone  (ALDACTONE) 25 MG tablet TAKE 1 TABLET BY MOUTH  DAILY Patient taking differently: TAKE 25MG  BY MOUTH  DAILY 05/30/17  Yes Laurey Morale, MD  torsemide (DEMADEX) 20 MG tablet Take 4 tablets (80 mg total) by mouth 2 (two) times daily. Patient taking differently: Take 40-80 mg by mouth See admin instructions. Take 4 tablets (80mg ) every morning. Then take 2 tablets (40mg ) every evening. 02/12/18  Yes Laurey Morale, MD  OXYGEN Inhale 2-3 L into the lungs continuous. At rest 2 liters  Moving around 3 liters    [provider]    Past Medical History: Past Medical History:  Diagnosis Date  . Acute parotitis 01/05/2016  . Anxiety   . Bacteremia, escherichia coli   . Bursitis, shoulder    bilateral  . CHF (congestive heart failure) (HCC)    chronic diasotlic  CHF  . Complication of anesthesia    "have trouble getting me awake sometimes; been ok latelhy" (09/24/2015)  . COPD (chronic obstructive pulmonary disease) (HCC)   . Coronary heart disease 2001   s/p CABG  . Depression   . DJD (degenerative joint disease)   . Dyspnea   . GERD (gastroesophageal reflux disease)   . H/O hiatal hernia   . History of rheumatic heart disease    X 3-LAST TIME WHEN PATIENT WAS 77 YRS OLD  . Hyperlipidemia   . Hypertension   . OA (osteoarthritis)   . On home oxygen therapy    "2L; 24/7" (09/24/2015)  . PUD (peptic ulcer disease)   . Pulmonary HTN (HCC)    PASP 50-10mmHg by [id number]  . Sleep apnea    intolerant to CPAP  . Thyroid nodule   . Urinary incontinence   . Urine incontinence   . Varicose veins     Past Surgical History: Past Surgical History:  Procedure Laterality Date  . BILATERAL SALPINGOOPHORECTOMY    . CARDIAC CATHETERIZATION  02/11/2008, 01/30/2004, 06/14/2000  . CARDIAC CATHETERIZATION N/A 09/24/2015   Procedure: Right Heart Cath;  Surgeon: Laurey Morale, MD;  Location[facility] INVASIVE CV LAB;  Service: Cardiovascular;  Laterality: N/A;  . CARDIAC CATHETERIZATION N/A  02/08/2016   Procedure: Right/Left Heart Cath and Coronary/Graft Angiography;  Surgeon: Laurey Morale, MD;  Location: [facility] INVASIVE CV LAB;  Service: Cardiovascular;  Laterality: N/A;  . CARDIOVERSION N/A 06/12/2016   Procedure: CARDIOVERSION;  Surgeon: Duke Salvia, MD;  Location: [facility] OR;  Service: Cardiovascular;  Laterality: N/A;  . CORONARY ARTERY BYPASS GRAFT  06/21/2000   x4  . INCONTINENCE SURGERY     "tacked"  . TEE WITHOUT CARDIOVERSION N/A 09/24/2015   Procedure: TRANSESOPHAGEAL ECHOCARDIOGRAM (TEE);  Surgeon: Laurey Morale, MD;  Location: [facility] ENDOSCOPY;  Service: Cardiovascular;  Laterality: N/A;  . THYROIDECTOMY, PARTIAL  2010   Dr Michaell Cowing  . TOTAL ABDOMINAL HYSTERECTOMY  1990   1990  . TUBAL LIGATION    . VAGINAL DELIVERY  x4    Family History: Family History  Problem Relation Age of Onset  . Anemia Mother   . Emphysema Father   . Esophageal cancer Daughter     Social History: Social History   Socioeconomic History  . Marital status: Married    Spouse name: Not on file  . Number of children: Not on file  . Years of education: Not on file  . Highest education level: Not on file  Occupational History  . Occupation: retired  Engineer, production  . Financial resource strain: Not on file  . Food insecurity:    Worry: Not on file    Inability: Not on file  . Transportation needs:    Medical: Not on file    Non-medical: Not on file  Tobacco Use  . Smoking status: Former Smoker    Packs/day: 1.00    Years: 47.00    Pack years: 47.00    Types: Cigarettes    Last attempt to quit: 07/26/2007    Years since quitting: 10.5  . Smokeless tobacco: Never Used  Substance and Sexual Activity  . Alcohol use: No  . Drug use: No  . Sexual activity: Not Currently  Lifestyle  . Physical activity:    Days per week: Not on file    Minutes per session: Not on file  . Stress: Not on file  Relationships  . Social connections:    Talks on  phone: Not on file    Gets together: Not  on file    Attends religious service: Not on file    Active member of club or organization: Not on file    Attends meetings of clubs or organizations: Not on file    Relationship status: Not on file  Other Topics Concern  . Not on file  Social History Narrative   Husband bilateral left amputee for atheorsclerotic disease    Allergies:  Allergies  Allergen Reactions  . Lisinopril Cough    Developed persistent dry cough for a month.   . Tape Other (See Comments)    Bleeding  . Cefpodoxime Other (See Comments)    Yeast infections   . Codeine Other (See Comments)    Anxious/ jittery  . Lipitor [Atorvastatin] Other (See Comments)    Made joint start hurting/ Muscle aches  . Other Swelling    Venholin HFA  . Penicillins Other (See Comments)    childhood reaction Has patient had a PCN reaction causing immediate rash, facial/tongue/throat swelling, SOB or lightheadedness with hypotension: unknown Has patient had a PCN reaction causing severe rash involving mucus membranes or skin necrosis: No Has patient had a PCN reaction that required hospitalization No Has patient had a PCN reaction occurring within the last 10 years: No If all of the above answers are "NO", then may proceed with Cephalosporin use.   Terald Sleeper [Kdc:Albuterol] Other (See Comments)    "jittery"  . Xopenex [Levalbuterol] Other (See Comments)    Made mouth and throat sore  . Anoro Ellipta [Umeclidinium-Vilanterol] Itching  . Clindamycin Other (See Comments)    Unknown   . Hydrocodone-Acetaminophen Anxiety  . Latex Itching and Rash    Objective:    Vital Signs:   Temp:  [98.2 F (36.8 C)-98.4 F (36.9 C)] 98.2 F (36.8 C) (08/01 0416) Pulse Rate:  [66-79] 79 (08/01 0416) Resp:  [16-28] 28 (08/01 0416) BP: (110-129)/(42-63) 113/56 (08/01 0416) SpO2:  [91 %-100 %] 94 % (08/01 0837) Weight:  [198 lb 3.1 oz (89.9 kg)-203 lb (92.1 kg)] 198 lb 3.1 oz (89.9 kg) (08/01 0412) Last BM Date: 02/20/18  Weight  change: Filed Weights   02/21/18 1701 02/21/18 2139 02/22/18 0412  Weight: 203 lb (92.1 kg) 200 lb 9.9 oz (91 kg) 198 lb 3.1 oz (89.9 kg)    Intake/Output:   Intake/Output Summary (Last 24 hours) at 02/22/2018 0924 Last data filed at 02/22/2018 0552 Gross per 24 hour  Intake 480 ml  Output 1300 ml  Net -820 ml      Physical Exam    General:  Elderly. Frail. No resp difficulty HEENT: normal Neck: supple. JVP 7-8. Carotids 2+ bilat; no bruits. No lymphadenopathy or thyromegaly appreciated. Cor: PMI nondisplaced. Regular rate & rhythm. 2/6 SEM RUSB with clear S2 Lungs: crackles in bases. Coarse throughout. On 2 L [place] Abdomen: soft, nontender, nondistended. No hepatosplenomegaly. No bruits or masses. Good bowel sounds. Extremities: no cyanosis, clubbing, rash, BLE 1+ edema Neuro: alert & orientedx3, cranial nerves grossly intact. moves all 4 extremities w/o difficulty. Affect pleasant  Telemetry   NSR 70s. Personally reviewed  EKG    NSR 72 bpm with RBBB. Personally reviewed.   Labs   Basic Metabolic Panel: Recent Labs  Lab 02/21/18 1700 02/22/18 0323  NA 141 144  K 3.4* 3.2*  CL 95* 97*  CO2 35* 36*  GLUCOSE 99 124*  BUN 26* 22  CREATININE 1.35* 1.14*  CALCIUM 9.3 9.1  MG  --  2.5*    Liver Function Tests: Recent Labs  Lab 02/21/18 1700  AST 16  ALT 18  ALKPHOS 66  BILITOT 0.8  PROT 6.1*  ALBUMIN 3.3*   No results for input(s): LIPASE, AMYLASE in the last 168 hours. No results for input(s): AMMONIA in the last 168 hours.  CBC: Recent Labs  Lab 02/21/18 1700 02/22/18 0323  WBC 6.7 7.5  NEUTROABS 4.6  --   HGB 9.8* 10.4*  HCT 32.3* 35.1*  MCV 98.5 99.4  PLT 176 170    Cardiac Enzymes: Recent Labs  Lab 02/21/18 1700  TROPONINI <0.03    BNP: BNP (last 3 results) Recent Labs    01/18/18 1341 01/25/18 0749 02/21/18 1717  BNP 254.3* 507.3* 252.7*    ProBNP (last 3 results) No results for input(s): PROBNP in the last 8760  hours.   CBG: No results for input(s): GLUCAP in the last 168 hours.  Coagulation Studies: No results for input(s): LABPROT, INR in the last 72 hours.   Imaging   Dg Chest 2 View  Result Date: 02/21/2018 CLINICAL DATA:  Increasing shortness of breath EXAM: CHEST - 2 VIEW COMPARISON:  Plain film from 01/25/2018, chest CT from 01/30/2018 FINDINGS: Cardiac shadow is enlarged. Left pleural effusion with mild left basilar atelectasis is seen slightly increased when compare with the prior exam. The right lung is clear. Postsurgical changes are noted. No acute bony abnormality is seen. IMPRESSION: Increase in left basilar atelectasis and effusion. Electronically Signed   By: [name] M.D.   On: 02/21/2018 18:38      Medications:     Current Medications: . amiodarone  200 mg Oral Daily  . calcium-vitamin D  2 tablet Oral Q breakfast  . clonazePAM  0.5 mg Oral QHS  . furosemide  80 mg Intravenous BID  . ipratropium  0.5 mg Nebulization QID  . isosorbide mononitrate  90 mg Oral Daily  . metoprolol tartrate  50 mg Oral BID  . pantoprazole  40 mg Oral Daily  . potassium chloride SA  40 mEq Oral BID  . Rivaroxaban  15 mg Oral Q supper  . simvastatin  20 mg Oral q1800  . sodium chloride flush  3 mL Intravenous Q12H  . spironolactone  25 mg Oral Daily     Infusions: . sodium chloride         Patient Profile   [name] is a 77 y.o. female with a history of chronic diastolic CHF, moderate pulmonary HTN, HTN, CAD s/p CABG, COPD on home oxygen, and dyslipidemia   Admitted with worsening SOB.   Assessment/Plan   1. Acute on chronic diastolic CHF/valvular heart disease with RV failure: TEE in 3/17 showed normal EF, mild to moderate mitral stenosis.  Most recent RHC in 7/17 showed near-normal filling pressures.  Echo in 2/19 showed normal EF, mild-moderate MS, mild AI, flattened interventricular septum suggestive of RV failure.  - Echo 7/19: EF 60-65%, moderate concentric  hypertrophy, mild AI, mild to mod MR, diastolic flattening of ventricular septum, LA and RA severely dilated, RV mildly dilated, severe TR, PA peak pressure 62 mm Hg.  There was mild to moderate MR, moderate MS with MVA 1.5 cm^2 and mean gradient 8 mmHg.  - Volume status stable to mildly overloaded. - Continue 80 mg IV lasix BID. Creatinine stable.  - Continue 25 mg spironolactone daily. SBP 110-120s - Add unna boots 2. CAD S/P CABG:  She has potential source of angina from 90% stenosis  in distal LCx.  The vessel is small at this point and not amenable to PCI.    - Continue statin  - On Xarelto so no Aspirin. - Continue Imdur.  - No s/s ischemia.  3. Atrial fibrillation: Paroxysmal. CHADSVASC score is 4 (age > 53, female, HTN, CHF).  S/P DC-CV 06/12/16.  - She is in NSR today.  - Continue amio to 200 mg daily.  - Continue Xarelto/metoprolol.  4. Pulmonary HTN: This is primarily pulmonary venous hypertension based on last RHC. No change.  5. COPD: Followed by Dr Maple Hudson. PFTs in 2/17 with moderate to severe obstruction.  On 2 liters [place] (home dose) - Follows with Dr Maple Hudson - She is afebrile with no WBC count - CXR shows LLL atelectasis and effusion  6.  OSA: intolerant CPAP. No change.  7.  Mitral stenosis: Rheumatic mitral stenosis.  Mild to moderate mitral stenosis by TEE in 3/17.  Unable to assess by cath in 3/17 because she had aortic valve vegetation and the valve was not crossed. Unable to assess by cath in 7/17 because she had transient complete heart block when the LV was entered. Treating medically for now, still mild to moderate MS on 2/19 echo.  Would be high risk for surgery with severe COPD.  However, possibly could be valvuloplasty candidate.    - No change.  8. Aortic valve disease: Mild AI on last echo. No change 9. Lung nodule - concern for adenocarcinoma - PET scan arranged by Dr Maple Hudson (not yet done) 10. Hypokalemia - K 3.3. Supp ordered.   Will schedule for R/LHC  (pressures only) on Monday.   Medication concerns reviewed with patient and pharmacy team. Barriers identified: none  Length of Stay: 1  Alford Highland, NP  02/22/2018, 9:24 AM  Advanced Heart Failure Team Pager [phone] (M-F; 7a - 4p)  Please contact [facility] for night-coverage after hours (4p -7a ) and weekends on [email or web address]  Patient seen with NP, agree with the above note.  She was admitted again with increased dyspnea and thought to be volume overloaded. She says that her SNF held several doses of her diuretic due to low BP.    Since arrival, she has diuresed with IV Lasix and is feeling better.  She is in NSR today.   On exam, JVP 10-12 cm, 2+ edema to knees.  Regular S1S2, 2/6 SEM RUSB.    1. Acute on chronic diastolic CHF: Echo in 7/19 with EF 60-65%, moderate LVH, rheumatic MV with moderate MS (mean gradient 8, MVA 1.58 cm^2), mild-moderate MR, severe TR, PASP 762 mmHg, D-shaped interventricular septum with mildly dilated RV.  Suspect significant RV failure in setting of moderate mitral stenosis and severe TR. She has been very difficult to manage due to combination of CHF, valvular heart disease and severe COPD.  She was admitted again with dyspnea after missing doses of her diuretic at SNF.  On exam, she is volume overloaded.  She has diuresed some so far with IV Lasix.  - Lasix 80 mg IV bid, follow I/Os.  - Unna boots - spironolactone 25 mg daily.  - I will plan RHC after diuresis, possibly Monday, to ensure that we are getting her near euvolemic.  Will likely due simultaneous LHC (without contrast) to assess severity of mitral stenosis.  2. CAD: S/P CABG.  She has potential source of angina from 90% stenosis in distal LCx.  The vessel is small at this point and not amenable to PCI.   -  Continue Imdur.  - Continue statin.  - No ASA given Xarelto use.  3. Atrial fibrillation: Paroxysmal.  NSR currently.   - Continue amiodarone.  - Xarelto (hold Sunday for cath Monday).   4. 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  She is not wheezing.  Continue home oxygen.  5. 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  She also has mild to moderate MR.  I suspect that the MR makes her a poor candidate for mitral valvuloplasty, and she also would not tolerate anesthesia well with severe COPD.  She also would be a poor operative candidate due to frailty and severe COPD.  I think our only choice here will be medical management.  - Will plan RHC/LHC Monday to assess filling pressures and severity of mitral stenosis.  6. Lung nodule: Concerning for malignancy, plan for PET as outpatient.   Marca Ancona 02/22/2018 2:10 PM

## 2018-02-22 NOTE — Progress Notes (Signed)
Ortho tech states they will come to place unna boots on pt.

## 2018-02-23 ENCOUNTER — Inpatient Hospital Stay (HOSPITAL_COMMUNITY): Payer: Medicare Other

## 2018-02-23 LAB — BLOOD GAS, ARTERIAL
ACID-BASE EXCESS: 12.2 mmol/L — AB (ref 0.0–2.0)
Bicarbonate: 38.7 mmol/L — ABNORMAL HIGH (ref 20.0–28.0)
DRAWN BY: 441371
O2 Content: 3 L/min
O2 Saturation: 91.2 %
PATIENT TEMPERATURE: 98.6
pCO2 arterial: 79.6 mmHg (ref 32.0–48.0)
pH, Arterial: 7.308 — ABNORMAL LOW (ref 7.350–7.450)
pO2, Arterial: 66.7 mmHg — ABNORMAL LOW (ref 83.0–108.0)

## 2018-02-23 LAB — BASIC METABOLIC PANEL
Anion gap: 11 (ref 5–15)
BUN: 25 mg/dL — ABNORMAL HIGH (ref 8–23)
CO2: 37 mmol/L — ABNORMAL HIGH (ref 22–32)
Calcium: 9 mg/dL (ref 8.9–10.3)
Chloride: 96 mmol/L — ABNORMAL LOW (ref 98–111)
Creatinine, Ser: 1.34 mg/dL — ABNORMAL HIGH (ref 0.44–1.00)
GFR calc Af Amer: 43 mL/min — ABNORMAL LOW (ref >60)
GFR calc non Af Amer: 37 mL/min — ABNORMAL LOW (ref >60)
Glucose, Bld: 121 mg/dL — ABNORMAL HIGH (ref 70–99)
Potassium: 3.4 mmol/L — ABNORMAL LOW (ref 3.5–5.1)
SODIUM: 144 mmol/L (ref 135–145)

## 2018-02-23 LAB — CBC
HCT: 33 % — ABNORMAL LOW (ref 36.0–46.0)
Hemoglobin: 9.8 g/dL — ABNORMAL LOW (ref 12.0–15.0)
MCH: 30.1 pg (ref 26.0–34.0)
MCHC: 29.7 g/dL — ABNORMAL LOW (ref 30.0–36.0)
MCV: 101.2 fL — ABNORMAL HIGH (ref 78.0–100.0)
PLATELETS: 163 10*3/uL (ref 150–400)
RBC: 3.26 MIL/uL — AB (ref 3.87–5.11)
RDW: 16.3 % — ABNORMAL HIGH (ref 11.5–15.5)
WBC: 8 10*3/uL (ref 4.0–10.5)

## 2018-02-23 MED ORDER — POTASSIUM CHLORIDE CRYS ER 20 MEQ PO TBCR: 40.0000 meq | EXTENDED_RELEASE_TABLET | Freq: Once | ORAL | Status: DC

## 2018-02-23 MED ORDER — LEVALBUTEROL HCL 0.63 MG/3ML IN NEBU: 0.6300 mg | INHALATION_SOLUTION | Freq: Four times a day (QID) | RESPIRATORY_TRACT | Status: DC | PRN

## 2018-02-23 MED ORDER — LORAZEPAM 0.5 MG PO TABS
0.5000 mg | ORAL_TABLET | Freq: Once | ORAL | Status: AC | PRN
  Administered 2018-02-23: 0.5 mg via ORAL
  Filled 2018-02-23: qty 1

## 2018-02-23 MED ORDER — POTASSIUM CHLORIDE CRYS ER 20 MEQ PO TBCR
40.0000 meq | EXTENDED_RELEASE_TABLET | Freq: Once | ORAL | Status: AC
  Administered 2018-02-23: 40 meq via ORAL
  Filled 2018-02-23: qty 2

## 2018-02-23 MED ORDER — LEVALBUTEROL TARTRATE 45 MCG/ACT IN AERO: 1.0000 | INHALATION_SPRAY | Freq: Four times a day (QID) | RESPIRATORY_TRACT | Status: DC | PRN

## 2018-02-23 MED ORDER — METOLAZONE 2.5 MG PO TABS
2.5000 mg | ORAL_TABLET | Freq: Once | ORAL | Status: AC
  Administered 2018-02-23: 2.5 mg via ORAL
  Filled 2018-02-23: qty 1

## 2018-02-23 MED ORDER — FUROSEMIDE 10 MG/ML IJ SOLN: 60.0000 mg | Freq: Two times a day (BID) | INTRAMUSCULAR | Status: DC

## 2018-02-23 MED ORDER — FUROSEMIDE 10 MG/ML IJ SOLN
80.0000 mg | Freq: Two times a day (BID) | INTRAMUSCULAR | Status: DC
  Administered 2018-02-23 – 2018-02-25 (×5): 80 mg via INTRAVENOUS
  Filled 2018-02-23 (×5): qty 8

## 2018-02-23 NOTE — Progress Notes (Signed)
IV lasix 80mg  given at bedside with MD present  Pt unable to swallow oral medications at this time, will continue to monitor

## 2018-02-23 NOTE — Progress Notes (Signed)
Report given to [facility] on 2H  Pt has all belongings Directed pt family to waiting room  RT at bedside to set up bipap

## 2018-02-23 NOTE — Progress Notes (Signed)
PROGRESS NOTE    [name]  AVW:[id number] DOB: [birth year] DOA: 02/21/2018 PCP: Maurice Small, MD      Brief Narrative:  [name] is a 77 y.o. 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   Had recently been admitted to the hospital for dyspnea.  Treated with IV steroids and nebs as well as IV Lasix, improved and was discharged (although she reports leg swelling *worsened* in hospital).  For about a week at [facility] she was doing well, then last 7 days, started to have worsening dyspnea, weight gain.      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  EF 65% earlier this month.Net -2 L yesterday.  Creatinine slightly up to 1.3. -Hold home torsemide 80 BID -Continue furosemide, defer dosing cardiology -Continue beta-blocker, spinal lactone -Continue potassium supplement -Strict I/Os, daily weights, telemetry  -Daily monitoring renal function -Consult to CHF team, appreciate cares -Low threshold to stop Imdur if needed for BP   Acute on chronic hypoxic respiratory failure This morning patient is lethargic, struggling to breathe.  Overnight she was given clonazepam for anxiety and insomnia, and her oxygen saturation was turned up to 100%.  ABG ordered and interpreted by me shows a mixed acute on chronic respiratory acidosis - Stop clonazepam - Strictly limit SPO2 88 to 92% -BiPAP for 4-6 hours, if mentation improves, remove and monitor clinically with bronchodilators -CPAP at night as tolerated  Coronary artery disease secondary prevention Hypertension BP remains normal -Continue Imdur metoprolol -Continue statin  Paroxysmal atrial fibrillation CHA2DS2-Vasc 6, on Xarelto. -Continue Xarelto -Continue amiodarone -Continue metoprolol  Severe COPD No active  disease, no wheezing.  Patient is evidently intolerant to LAMAs, LABAs, and ICSs. -Continue Atrovent 4 times daily  Pulmonary hypertension  Normocytic anemia, chronic iron deficiency There was a report that her Hgb had dropped, this was incorrect, it is stable relative to her baseline ~10 g/dL.  No change in hemoglobin today  Hypokalemia Resolved -Continue potassium daily with Lasix  Other medications -Continue PPI        DVT prophylaxis: N/A on Xarelto Code Status: FULL Family Communication: Son at bedside MDM and disposition Plan: The below labs and imaging reports were reviewed and summarized above.  The patient was admitted with recurrent CHF.  The congestive heart failure specialty service was consulted, and are not diuresing the patient, with plans to do a right heart cath on Monday.  In the meantime, the patient had hypercapnic respiratory failure overnight last night from medication with clonazepam in the context of her chronic lung and heart disease.  We will transfer to the stepdown unit, use BiPAP, and reevaluate this afternoon.      Consultants:   Cardiology  Procedures:   None  Antimicrobials:   None    Subjective: Patient is quite dyspneic and coughing this morning.  She is also oriented but very sleepy, sluggish, "feels bad".  No chest pain, fever, sputum.  Leg swelling still persist.  Objective: Vitals:   02/23/18 1535 02/23/18 1700 02/23/18 1701 02/23/18 1800  BP:  (!) 114/44  (!) 114/43  Pulse:  70  68  Resp:  (!) 30  (!) 36  Temp: 98.2 F (36.8 C)     TempSrc: Oral  SpO2: 91% 92% 92% 92%  Weight:      Height:        Intake/Output Summary (Last 24 hours) at 02/23/2018 1824 Last data filed at 02/23/2018 1800 Gross per 24 hour  Intake 780 ml  Output 2075 ml  Net -1295 ml   Filed Weights   02/21/18 2139 02/22/18 0412 02/23/18 0445  Weight: 91 kg (200 lb 9.9 oz) 89.9 kg (198 lb 3.1 oz) 91 kg (200 lb 11.2 oz)    Examination: General  appearance: Obese adult female, in moderate respiratory distress, sitting in recliner, appears sluggish HEENT: Anicteric, conjunctival pink, lids and lashes normal.  No nasal deformity, discharge, epistaxis.  Lips moist, dentures normal, oropharynx moist, no oral lesions, hearing normal. Skin: Dry, no suspicious rashes or lesions. Cardiac: Regular rate and rhythm, soft systolic murmur.  3+ lower extremity edema.   JVP not visible. Respiratory: Dyspneic with talking, respirations appear labored, rales throughout, no wheezes. Abdomen: Abdomen soft, no tenderness to palpation or guarding, no ascites or hepatospleno megaly. MSK: No deformities or effusions Neuro: Awake and alert, sluggish, extraocular movements intact, moves all extremities, speech fluent. Psych: Awake and attention is diminished but she is oriented to person place and time, judgment and insight appear normal.    Data Reviewed: I have personally reviewed following labs and imaging studies:  CBC: Recent Labs  Lab 02/21/18 1700 02/22/18 0323 02/23/18 0216  WBC 6.7 7.5 8.0  NEUTROABS 4.6  --   --   HGB 9.8* 10.4* 9.8*  HCT 32.3* 35.1* 33.0*  MCV 98.5 99.4 101.2*  PLT 176 170 163   Basic Metabolic Panel: Recent Labs  Lab 02/21/18 1700 02/22/18 0323 02/23/18 0216  NA 141 144 144  K 3.4* 3.2* 3.4*  CL 95* 97* 96*  CO2 35* 36* 37*  GLUCOSE 99 124* 121*  BUN 26* 22 25*  CREATININE 1.35* 1.14* 1.34*  CALCIUM 9.3 9.1 9.0  MG  --  2.5*  --    GFR: Estimated Creatinine Clearance: 39.3 mL/min (A) (by C-G formula based on SCr of 1.34 mg/dL (H)). Liver Function Tests: Recent Labs  Lab 02/21/18 1700  AST 16  ALT 18  ALKPHOS 66  BILITOT 0.8  PROT 6.1*  ALBUMIN 3.3*   No results for input(s): LIPASE, AMYLASE in the last 168 hours. No results for input(s): AMMONIA in the last 168 hours. Coagulation Profile: No results for input(s): INR, PROTIME in the last 168 hours. Cardiac Enzymes: Recent Labs  Lab  02/21/18 1700  TROPONINI <0.03   BNP (last 3 results) No results for input(s): PROBNP in the last 8760 hours. HbA1C: No results for input(s): HGBA1C in the last 72 hours. CBG: No results for input(s): GLUCAP in the last 168 hours. Lipid Profile: No results for input(s): CHOL, HDL, LDLCALC, TRIG, CHOLHDL, LDLDIRECT in the last 72 hours. Thyroid Function Tests: No results for input(s): TSH, T4TOTAL, FREET4, T3FREE, THYROIDAB in the last 72 hours. Anemia Panel: No results for input(s): VITAMINB12, FOLATE, FERRITIN, TIBC, IRON, RETICCTPCT in the last 72 hours. Urine analysis:    Component Value Date/Time   COLORURINE STRAW (A) 02/21/2018 2038   APPEARANCEUR CLEAR 02/21/2018 2038   LABSPEC 1.006 02/21/2018 2038   PHURINE 6.0 02/21/2018 2038   GLUCOSEU NEGATIVE 02/21/2018 2038   HGBUR NEGATIVE 02/21/2018 2038   BILIRUBINUR NEGATIVE 02/21/2018 2038   KETONESUR NEGATIVE 02/21/2018 2038   PROTEINUR NEGATIVE 02/21/2018 2038   NITRITE NEGATIVE 02/21/2018 2038   LEUKOCYTESUR NEGATIVE 02/21/2018 2038   Sepsis  Labs: @LABRCNTIP (procalcitonin:4,lacticacidven:4)  ) Recent Results (from the past 240 hour(s))  MRSA PCR Screening     Status: None   Collection Time: 02/21/18  9:38 PM  Result Value Ref Range Status   MRSA by PCR NEGATIVE NEGATIVE Final    Comment:        The GeneXpert MRSA Assay (FDA approved for NASAL specimens only), is one component of a comprehensive MRSA colonization surveillance program. It is not intended to diagnose MRSA infection nor to guide or monitor treatment for MRSA infections. Performed at [facility], [address], [place], [place] [address]          Radiology Studies: Dg Chest 2 View  Result Date: 02/21/2018 CLINICAL DATA:  Increasing shortness of breath EXAM: CHEST - 2 VIEW COMPARISON:  Plain film from 01/25/2018, chest CT from 01/30/2018 FINDINGS: Cardiac shadow is enlarged. Left pleural effusion with mild left basilar atelectasis  is seen slightly increased when compare with the prior exam. The right lung is clear. Postsurgical changes are noted. No acute bony abnormality is seen. IMPRESSION: Increase in left basilar atelectasis and effusion. Electronically Signed   By: [name] M.D.   On: 02/21/2018 18:38   Dg Chest Port 1 View  Result Date: 02/23/2018 CLINICAL DATA:  Dyspnea. EXAM: PORTABLE CHEST 1 VIEW COMPARISON:  02/21/2018 FINDINGS: The patient is rotated to the left. Sequelae of prior CABG are again identified. The cardiac silhouette remains enlarged. Pulmonary vascular congestion appears slightly increased with suspected mild interstitial edema. There is persistent elevation of the left hemidiaphragm with blunting of the left lateral costophrenic angle. No large pleural effusion is evident. Bibasilar atelectasis has increased on the right. No pneumothorax is identified. IMPRESSION: 1. Cardiomegaly with increased pulmonary vascular congestion and mild interstitial edema. 2. Bibasilar atelectasis, increased on the right. Electronically Signed   By: Sebastian Ache M.D.   On: 02/23/2018 09:29        Scheduled Meds: . amiodarone  200 mg Oral Daily  . calcium-vitamin D  2 tablet Oral Q breakfast  . furosemide  80 mg Intravenous BID  . ipratropium  0.5 mg Nebulization QID  . isosorbide mononitrate  90 mg Oral Daily  . metoprolol tartrate  50 mg Oral BID  . pantoprazole  40 mg Oral Daily  . potassium chloride SA  40 mEq Oral BID  . Rivaroxaban  15 mg Oral Q supper  . simvastatin  20 mg Oral q1800  . sodium chloride flush  3 mL Intravenous Q12H  . spironolactone  25 mg Oral Daily   Continuous Infusions: . sodium chloride       LOS: 2 days    Time spent: 25 minutes    Alberteen Sam, MD [facility] 02/23/2018, 6:24 PM     Pager [phone] --- please page though AMION:  [email or web address] If 7PM-7AM, please contact night-coverage

## 2018-02-23 NOTE — Plan of Care (Signed)
Notified chaplain of pt and daughter's need to redo POA. Pt has document with her.

## 2018-02-23 NOTE — Progress Notes (Addendum)
Pt family at bedside concerned that pt is "out of it and it is not her baseline" Vital signs documented in flowsheets  Paged MD  MD aware and at bedside Verbal for blood gas, order placed Called respiratory therapy for breathing treatment and blood gas  RT at bedside  Update provided to family by MD  Awaiting results from blood gas  Pt sitting in chair, [place] 3L  MD stated to keep O2 88-92% due to hx of COPD

## 2018-02-23 NOTE — Progress Notes (Addendum)
Arrive at the room to talk with family about current HCPOA.  They provide me paperwork and state that they are okay with current HCPOA and living will.  They are not okay with her DNR status and would like her to have chest compressions but not vegetative state on a vent.  I shared with them to please talk to doctor about the DNR status because I have not ability to change that or get involved with that.  We are happy to assist if something comes up with Advanced Directive.  Placed a copy of what the family provided me with on her chart.   02/23/18 1559  Clinical Encounter Type  Visited With Patient;Patient and family together  Visit Type Initial;Spiritual support

## 2018-02-23 NOTE — Progress Notes (Signed)
Patient ID: [name], female   DOB: [birth year], 77 y.o.   MRN: [id number]     Advanced Heart Failure Rounding Note  PCP-Cardiologist: No primary care provider on file.   Subjective:    Patient is up to the commode chair this morning, tachypneic and short of breath.  Had a bad night, very anxious. No chest pain. I/Os negative, weight done in bed so unlikely to be accurate.    Objective:   Weight Range: 200 lb 11.2 oz (91 kg) Body mass index is 32.39 kg/m.   Vital Signs:   Temp:  [97.5 F (36.4 C)-98.2 F (36.8 C)] 98.2 F (36.8 C) (08/02 0445) Pulse Rate:  [58-81] 64 (08/02 0445) Resp:  [14-24] 16 (08/02 0445) BP: (102-120)/(44-82) 112/44 (08/02 0445) SpO2:  [94 %-100 %] 95 % (08/02 0445) Weight:  [200 lb 11.2 oz (91 kg)] 200 lb 11.2 oz (91 kg) (08/02 0445) Last BM Date: 02/20/18  Weight change: Filed Weights   02/21/18 2139 02/22/18 0412 02/23/18 0445  Weight: 200 lb 9.9 oz (91 kg) 198 lb 3.1 oz (89.9 kg) 200 lb 11.2 oz (91 kg)    Intake/Output:   Intake/Output Summary (Last 24 hours) at 02/23/2018 0824 Last data filed at 02/23/2018 0500 Gross per 24 hour  Intake 963 ml  Output 2200 ml  Net -1237 ml      Physical Exam    General:  Well appearing. No resp difficulty HEENT: Normal Neck: Supple. JVP difficult but suspect elevated. Carotids 2+ bilat; no bruits. No lymphadenopathy or thyromegaly appreciated. Cor: PMI nondisplaced. Regular rate & rhythm. 2/6 HSM LLSB.  Lungs: Distant breath sounds.  Abdomen: Soft, nontender, nondistended. No hepatosplenomegaly. No bruits or masses. Good bowel sounds. Extremities: No cyanosis, clubbing, rash.  2+ edema to knees.  Neuro: Alert & orientedx3, cranial nerves grossly intact. moves all 4 extremities w/o difficulty. Affect pleasant   Telemetry   NSR 60s (personally reviewed)  Labs    CBC Recent Labs    02/21/18 1700 02/22/18 0323 02/23/18 0216  WBC 6.7 7.5 8.0  NEUTROABS 4.6  --   --   HGB 9.8* 10.4* 9.8*    HCT 32.3* 35.1* 33.0*  MCV 98.5 99.4 101.2*  PLT 176 170 163   Basic Metabolic Panel Recent Labs    28/41/32 0323 02/23/18 0216  NA 144 144  K 3.2* 3.4*  CL 97* 96*  CO2 36* 37*  GLUCOSE 124* 121*  BUN 22 25*  CREATININE 1.14* 1.34*  CALCIUM 9.1 9.0  MG 2.5*  --    Liver Function Tests Recent Labs    02/21/18 1700  AST 16  ALT 18  ALKPHOS 66  BILITOT 0.8  PROT 6.1*  ALBUMIN 3.3*   No results for input(s): LIPASE, AMYLASE in the last 72 hours. Cardiac Enzymes Recent Labs    02/21/18 1700  TROPONINI <0.03    BNP: BNP (last 3 results) Recent Labs    01/18/18 1341 01/25/18 0749 02/21/18 1717  BNP 254.3* 507.3* 252.7*    ProBNP (last 3 results) No results for input(s): PROBNP in the last 8760 hours.   D-Dimer No results for input(s): DDIMER in the last 72 hours. Hemoglobin A1C No results for input(s): HGBA1C in the last 72 hours. Fasting Lipid Panel No results for input(s): CHOL, HDL, LDLCALC, TRIG, CHOLHDL, LDLDIRECT in the last 72 hours. Thyroid Function Tests No results for input(s): TSH, T4TOTAL, T3FREE, THYROIDAB in the last 72 hours.  Invalid input(s): FREET3  Other results:  Imaging     No results found.   Medications:     Scheduled Medications: . amiodarone  200 mg Oral Daily  . calcium-vitamin D  2 tablet Oral Q breakfast  . clonazePAM  0.5 mg Oral QHS  . furosemide  80 mg Intravenous BID  . ipratropium  0.5 mg Nebulization QID  . isosorbide mononitrate  90 mg Oral Daily  . metolazone  2.5 mg Oral Once  . metoprolol tartrate  50 mg Oral BID  . pantoprazole  40 mg Oral Daily  . potassium chloride SA  40 mEq Oral BID  . potassium chloride  40 mEq Oral Once  . Rivaroxaban  15 mg Oral Q supper  . simvastatin  20 mg Oral q1800  . sodium chloride flush  3 mL Intravenous Q12H  . spironolactone  25 mg Oral Daily     Infusions: . sodium chloride       PRN Medications:  sodium chloride, acetaminophen, benzonatate,  diclofenac sodium, ondansetron (ZOFRAN) IV, sodium chloride flush    Assessment/Plan   1. Acute on chronic diastolic CHF: Echo in 7/19 with EF 60-65%, moderate LVH, rheumatic MV with moderate MS (mean gradient 8, MVA 1.58 cm^2), mild-moderate MR, severe TR, PASP 762 mmHg, D-shaped interventricular septum with mildly dilated RV.  Suspect significant RV failure in setting of moderate mitral stenosis and severe TR. She has been very difficult to manage due to combination of CHF, valvular heart disease and severe COPD.  She was admitted again with dyspnea after missing doses of her diuretic at SNF.  She is very short of breath today, some is likely anxiety and some COPD, but I suspect that she is still volume overloaded.  Exam is somewhat difficult for volume.  - Lasix 80 mg IV bid and will give a dose of metolazone 2.5 x 1 this morning.  Replace K.  Roland Rack boots - spironolactone 25 mg daily.  - I will plan RHC after diuresis, possibly Monday, to ensure that we are getting her near euvolemic.  Will likely due simultaneous LHC (without contrast) to assess severity of mitral stenosis.  2. CAD: S/P CABG. She has potential source of angina from 90% stenosis in distal LCx. The vessel is small at this point and not amenable to PCI.  - Continue Imdur.  - Continue statin.  - No ASA given Xarelto use.  3. Atrial fibrillation: Paroxysmal.  NSR currently.   - Continue amiodarone.  - Xarelto (hold Sunday for cath Monday).  4. 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  She is not wheezing.  Continue home oxygen.  5. 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  She also has mild to moderate MR.  I suspect that the MR makes her a poor candidate for mitral valvuloplasty, and she also would not tolerate anesthesia well with severe COPD. She also would be a poor surgical candidate due to frailty and severe COPD.  I think our only choice here will be medical management.  - Will plan RHC/LHC Monday to  assess filling pressures and severity of mitral stenosis.  6. Lung nodule: Concerning for malignancy, plan for PET as outpatient.   She is very frail with multiple comorbidities.  Palliative care consultation would be helpful here for goals of care.  She is currently full code.   Length of Stay: 2  Monica Ancona, MD  02/23/2018, 8:24 AM  Advanced Heart Failure Team Pager [phone] (M-F; 7a - 4p)  Please contact [facility] for  night-coverage after hours (4p -7a ) and weekends on [email or web address]

## 2018-02-23 NOTE — Progress Notes (Signed)
Pt complained of sore throat and requested Tylenol and ice water. These were given to the patient. Pt further reported that she was short of breath on 3L. Oxygen saturation was mid 90s on 3L. Pt's oxygen was increased to 4L for comfort. Pt then reports that she was having anxiety attack. RT was notified to assess the patient and MD was notified to report the anxiety. Will continue to monitor.

## 2018-02-23 NOTE — Progress Notes (Signed)
MD stated if pt not transferred in one hour to initiate bipap

## 2018-02-23 NOTE — Progress Notes (Signed)
Pt states she does not wear a CPAP at home.  She only used oxygen.  Pt does not wan to wear CPAP.

## 2018-02-23 NOTE — Progress Notes (Addendum)
Informed MD of blood gas results, RT aware  Placed request for stepdown bed  Asked MD if he wanted bipap initiated at this time, MD stated not necessary until transferred

## 2018-02-24 DIAGNOSIS — Z789 Other specified health status: Secondary | ICD-10-CM

## 2018-02-24 DIAGNOSIS — J441 Chronic obstructive pulmonary disease with (acute) exacerbation: Secondary | ICD-10-CM

## 2018-02-24 DIAGNOSIS — Z7189 Other specified counseling: Secondary | ICD-10-CM

## 2018-02-24 DIAGNOSIS — Z515 Encounter for palliative care: Secondary | ICD-10-CM

## 2018-02-24 DIAGNOSIS — I509 Heart failure, unspecified: Secondary | ICD-10-CM

## 2018-02-24 LAB — BASIC METABOLIC PANEL
Anion gap: 10 (ref 5–15)
BUN: 23 mg/dL (ref 8–23)
CHLORIDE: 92 mmol/L — AB (ref 98–111)
CO2: 43 mmol/L — AB (ref 22–32)
CREATININE: 0.99 mg/dL (ref 0.44–1.00)
Calcium: 9.6 mg/dL (ref 8.9–10.3)
GFR calc Af Amer: >60 mL/min (ref >60)
GFR calc non Af Amer: 53 mL/min — ABNORMAL LOW (ref >60)
GLUCOSE: 107 mg/dL — AB (ref 70–99)
POTASSIUM: 4.2 mmol/L (ref 3.5–5.1)
Sodium: 145 mmol/L (ref 135–145)

## 2018-02-24 LAB — CBC
HCT: 33.1 % — ABNORMAL LOW (ref 36.0–46.0)
Hemoglobin: 9.7 g/dL — ABNORMAL LOW (ref 12.0–15.0)
MCH: 29.7 pg (ref 26.0–34.0)
MCHC: 29.3 g/dL — AB (ref 30.0–36.0)
MCV: 101.2 fL — AB (ref 78.0–100.0)
Platelets: 158 10*3/uL (ref 150–400)
RBC: 3.27 MIL/uL — ABNORMAL LOW (ref 3.87–5.11)
RDW: 16.3 % — AB (ref 11.5–15.5)
WBC: 7.2 10*3/uL (ref 4.0–10.5)

## 2018-02-24 MED ORDER — PREDNISONE 20 MG PO TABS
40.0000 mg | ORAL_TABLET | Freq: Every day | ORAL | Status: DC
  Administered 2018-02-24 – 2018-02-27 (×4): 40 mg via ORAL
  Filled 2018-02-24 (×4): qty 2

## 2018-02-24 MED ORDER — METOLAZONE 5 MG PO TABS
2.5000 mg | ORAL_TABLET | Freq: Once | ORAL | Status: AC
  Administered 2018-02-24: 2.5 mg via ORAL
  Filled 2018-02-24: qty 1

## 2018-02-24 MED ORDER — ACETAZOLAMIDE 250 MG PO TABS
250.0000 mg | ORAL_TABLET | Freq: Two times a day (BID) | ORAL | Status: AC
  Administered 2018-02-24 (×2): 250 mg via ORAL
  Filled 2018-02-24 (×2): qty 1

## 2018-02-24 MED ORDER — CLONAZEPAM 0.5 MG PO TABS: 0.5000 mg | ORAL_TABLET | Freq: Every day | ORAL | Status: DC

## 2018-02-24 MED ORDER — PREDNISONE 20 MG PO TABS: 40.0000 mg | ORAL_TABLET | Freq: Every day | ORAL | Status: DC

## 2018-02-24 MED ORDER — ALPRAZOLAM 0.25 MG PO TABS
0.2500 mg | ORAL_TABLET | Freq: Every evening | ORAL | Status: DC | PRN
  Administered 2018-02-24 – 2018-02-26 (×2): 0.25 mg via ORAL
  Filled 2018-02-24 (×2): qty 1

## 2018-02-24 NOTE — Progress Notes (Signed)
PROGRESS NOTE    [name]  ZOX:[id number] DOB: 10-21-1940 DOA: 02/21/2018 PCP: Maurice Small, MD      Brief Narrative:  [name] is a 77 y.o. 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   Had recently been admitted to the hospital for dyspnea.  Treated with IV steroids and nebs as well as IV Lasix, improved and was discharged (although she reports leg swelling *worsened* in hospital).  For about a week at [facility] she was doing well, then last 7 days, started to have worsening dyspnea, weight gain.      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  EF 65% earlier this month.net negative almost 3 L yesterday.  Creatinine down to 0.99, improved.  Potassium 4.2.  -Hold home torsemide 80 BID -Continue furosemide, defer dosing cardiology -Continue metoprolol, Spironolactone -Continue K -Strict I/Os, daily weights, telemetry  -Daily monitoring renal function -Consult to CHF team, appreciate cares -Low threshold to stop Imdur if needed for BP   Acute on chronic hypoxic respiratory failure Untreated sleep apnea Pulmonary hypertension  After hypercapnic respiratory failure yesterday, patient was BiPAP'd briefly, improved.  Ovenright however, she was desatting with sleep.  Recommended to use CPAP for sleep, which she declined, stating she takes Clonazepam for dyspnea at night.     -Strictly limit SPO2 88 to 92% -Recommend CPAP at night as tolerated  Coronary artery disease secondary prevention Hypertension BP remains normal -Continue Imdur, metoprolol, statin  Paroxysmal atrial fibrillation CHA2DS2-Vasc 6, on Xarelto. -Continue amiodarone, metoprolol, Xarelto  Severe COPD, now with exacerbation Hypercapnic yesterday, today tight and wheezing.  Patient is evidently  intolerant to LAMAs, LABAs, and ICSs. -Start prednisone -Add Xopenex as needed -Continue Atrovent 4 times daily  Pulmonary hypertension  Normocytic anemia, chronic iron deficiency There was a report that her Hgb had dropped, this was incorrect, it is stable relative to her baseline ~10 g/dL.  Hemoglobin stable  Hypokalemia Resolved -Continue potassium daily with Lasix  Other medications -Continue PPI        DVT prophylaxis: N/A on Xarelto Code Status: FULL Family Communication: Son at bedside MDM and disposition Plan: The below labs and imaging reports were reviewed and summarized above.  Medication management as above.  We will continue diuresis over the weekend, RHC on Monday.   Consultants:   Cardiology  Procedures:   None  Antimicrobials:   None    Subjective: She is sleepy, but arousable this morning and oriented.  Dyspnea persists, she is tachypneic.  Today she is wheezing.  She is anxious.  Objective: Vitals:   02/24/18 0750 02/24/18 0804 02/24/18 1210 02/24/18 1211  BP: 110/70  (!) 112/43   Pulse: 63  63   Resp: (!) 26  (!) 21   Temp: 98.1 F (36.7 C)  98.2 F (36.8 C)   TempSrc: Oral  Oral   SpO2: 97% 98% 93% 92%  Weight:      Height:        Intake/Output Summary (Last 24 hours) at 02/24/2018 1521 Last data filed at 02/24/2018 1300 Gross per 24 hour  Intake 480 ml  Output 3025 ml  Net -2545 ml   Filed Weights   02/23/18 0445 02/23/18 2013 02/24/18 0600  Weight: 91 kg (200 lb 11.2  oz) 89.1 kg (196 lb 6.4 oz) 88.4 kg (194 lb 14.4 oz)    Examination: General appearance: Obese adult female, sitting up in a chair, appears weak and dyspneic HEENT: Anicteric, conjunctival pink, lids and lashes normal.  No nasal deformity, discharge, or epistaxis.  Lips moist, dentures in place.  Oropharynx tacky dry, no oral lesions, hearing normal Skin: Skin dry, no suspicious rashes or lesions Cardiac: Regular rate and rhythm, soft systolic murmur, 1+  lower extremity edema, Respiratory: Appears dyspneic, respirations somewhat increased, rales at bases, wheezing bilaterally today Abdomen: Abdomen soft, no tenderness to palpation or guarding, no ascites or hepatosplenomegaly MSK: No deformities or effusions Neuro: Awake and alert, sluggish, extraocular movements intact, moves all extremities, speech fluent Psych: Awake and attention diminished but oriented to person, place, time, judgment and insight appear impaired    Data Reviewed: I have personally reviewed following labs and imaging studies:  CBC: Recent Labs  Lab 02/21/18 1700 02/22/18 0323 02/23/18 0216 02/24/18 0512  WBC 6.7 7.5 8.0 7.2  NEUTROABS 4.6  --   --   --   HGB 9.8* 10.4* 9.8* 9.7*  HCT 32.3* 35.1* 33.0* 33.1*  MCV 98.5 99.4 101.2* 101.2*  PLT 176 170 163 158   Basic Metabolic Panel: Recent Labs  Lab 02/21/18 1700 02/22/18 0323 02/23/18 0216 02/24/18 0512  NA 141 144 144 145  K 3.4* 3.2* 3.4* 4.2  CL 95* 97* 96* 92*  CO2 35* 36* 37* 43*  GLUCOSE 99 124* 121* 107*  BUN 26* 22 25* 23  CREATININE 1.35* 1.14* 1.34* 0.99  CALCIUM 9.3 9.1 9.0 9.6  MG  --  2.5*  --   --    GFR: Estimated Creatinine Clearance: 52.4 mL/min (by C-G formula based on SCr of 0.99 mg/dL). Liver Function Tests: Recent Labs  Lab 02/21/18 1700  AST 16  ALT 18  ALKPHOS 66  BILITOT 0.8  PROT 6.1*  ALBUMIN 3.3*   No results for input(s): LIPASE, AMYLASE in the last 168 hours. No results for input(s): AMMONIA in the last 168 hours. Coagulation Profile: No results for input(s): INR, PROTIME in the last 168 hours. Cardiac Enzymes: Recent Labs  Lab 02/21/18 1700  TROPONINI <0.03   BNP (last 3 results) No results for input(s): PROBNP in the last 8760 hours. HbA1C: No results for input(s): HGBA1C in the last 72 hours. CBG: No results for input(s): GLUCAP in the last 168 hours. Lipid Profile: No results for input(s): CHOL, HDL, LDLCALC, TRIG, CHOLHDL, LDLDIRECT in the  last 72 hours. Thyroid Function Tests: No results for input(s): TSH, T4TOTAL, FREET4, T3FREE, THYROIDAB in the last 72 hours. Anemia Panel: No results for input(s): VITAMINB12, FOLATE, FERRITIN, TIBC, IRON, RETICCTPCT in the last 72 hours. Urine analysis:    Component Value Date/Time   COLORURINE STRAW (A) 02/21/2018 2038   APPEARANCEUR CLEAR 02/21/2018 2038   LABSPEC 1.006 02/21/2018 2038   PHURINE 6.0 02/21/2018 2038   GLUCOSEU NEGATIVE 02/21/2018 2038   HGBUR NEGATIVE 02/21/2018 2038   BILIRUBINUR NEGATIVE 02/21/2018 2038   KETONESUR NEGATIVE 02/21/2018 2038   PROTEINUR NEGATIVE 02/21/2018 2038   NITRITE NEGATIVE 02/21/2018 2038   LEUKOCYTESUR NEGATIVE 02/21/2018 2038   Sepsis Labs: @LABRCNTIP (procalcitonin:4,lacticacidven:4)  ) Recent Results (from the past 240 hour(s))  MRSA PCR Screening     Status: None   Collection Time: 02/21/18  9:38 PM  Result Value Ref Range Status   MRSA by PCR NEGATIVE NEGATIVE Final    Comment:  The GeneXpert MRSA Assay (FDA approved for NASAL specimens only), is one component of a comprehensive MRSA colonization surveillance program. It is not intended to diagnose MRSA infection nor to guide or monitor treatment for MRSA infections. Performed at [facility], [address] [place], [place] [address]          Radiology Studies: Dg Chest Port 1 View  Result Date: 02/23/2018 CLINICAL DATA:  Dyspnea. EXAM: PORTABLE CHEST 1 VIEW COMPARISON:  02/21/2018 FINDINGS: The patient is rotated to the left. Sequelae of prior CABG are again identified. The cardiac silhouette remains enlarged. Pulmonary vascular congestion appears slightly increased with suspected mild interstitial edema. There is persistent elevation of the left hemidiaphragm with blunting of the left lateral costophrenic angle. No large pleural effusion is evident. Bibasilar atelectasis has increased on the right. No pneumothorax is identified. IMPRESSION: 1.  Cardiomegaly with increased pulmonary vascular congestion and mild interstitial edema. 2. Bibasilar atelectasis, increased on the right. Electronically Signed   By: Sebastian Ache M.D.   On: 02/23/2018 09:29        Scheduled Meds: . acetaZOLAMIDE  250 mg Oral BID  . amiodarone  200 mg Oral Daily  . calcium-vitamin D  2 tablet Oral Q breakfast  . furosemide  80 mg Intravenous BID  . ipratropium  0.5 mg Nebulization QID  . isosorbide mononitrate  90 mg Oral Daily  . metoprolol tartrate  50 mg Oral BID  . pantoprazole  40 mg Oral Daily  . potassium chloride SA  40 mEq Oral BID  . predniSONE  40 mg Oral Q breakfast  . Rivaroxaban  15 mg Oral Q supper  . simvastatin  20 mg Oral q1800  . sodium chloride flush  3 mL Intravenous Q12H  . spironolactone  25 mg Oral Daily   Continuous Infusions: . sodium chloride       LOS: 3 days    Time spent: 25 minutes    Alberteen Sam, MD [facility] 02/24/2018, 3:21 PM     Pager [phone] --- please page though AMION:  [email or web address] If 7PM-7AM, please contact night-coverage

## 2018-02-24 NOTE — Progress Notes (Signed)
Pt stated she does not want wear CPAP for the night.  Pt is on 2L [place] and SP02 is 93%.  RT will monitor as needed.

## 2018-02-24 NOTE — Progress Notes (Signed)
Patient's oxygen sats dropped intermittently during the night. She is on O2 at 3L. Patient refused CPAP stating "I use Ativan at home when my oxygen drops. It calms me down and makes me breath better".  On call Dr. Antionette Char made aware. Order received for Klonopin at bedtime.O2 sats currently 95-98 %. Will continue to monitor patient.

## 2018-02-24 NOTE — Progress Notes (Signed)
Pt resting with eyes closed on cpap in chair.  Oxygen saturation 91%.  Will continue to monitor pt closely.

## 2018-02-24 NOTE — Progress Notes (Signed)
Patient ID: [name], female   DOB: 1941/01/10, 77 y.o.   MRN: [id number]     Advanced Heart Failure Rounding Note  PCP-Cardiologist: No primary care provider on file.   Subjective:    Good diuresis yesterday, weight down 2 lbs.  Still short of breath.  Required Bipap yesterday with hypercapnea.  Currently on 2L nasal cannula.  Creatinine stable but HCO3 up to 43.    Objective:   Weight Range: 194 lb 14.4 oz (88.4 kg) Body mass index is 31.46 kg/m.   Vital Signs:   Temp:  [97.7 F (36.5 C)-98.9 F (37.2 C)] 98.1 F (36.7 C) (08/03 0750) Pulse Rate:  [57-72] 63 (08/03 0750) Resp:  [17-36] 26 (08/03 0750) BP: (109-140)/(35-96) 110/70 (08/03 0750) SpO2:  [91 %-98 %] 98 % (08/03 0804) FiO2 (%):  [30 %] 30 % (08/02 1227) Weight:  [194 lb 14.4 oz (88.4 kg)-196 lb 6.4 oz (89.1 kg)] 194 lb 14.4 oz (88.4 kg) (08/03 0600) Last BM Date: (pt unsure)  Weight change: Filed Weights   02/23/18 0445 02/23/18 2013 02/24/18 0600  Weight: 200 lb 11.2 oz (91 kg) 196 lb 6.4 oz (89.1 kg) 194 lb 14.4 oz (88.4 kg)    Intake/Output:   Intake/Output Summary (Last 24 hours) at 02/24/2018 1017 Last data filed at 02/24/2018 0646 Gross per 24 hour  Intake 0 ml  Output 2425 ml  Net -2425 ml      Physical Exam    General: NAD Neck: JVP difficult but suspect still elevated, no thyromegaly or thyroid nodule.  Lungs: Distant breath sounds. CV: Nondisplaced PMI.  Heart regular S1/S2, no S3/S4, 1/6 SEM RUSB.  1+ edema to knees bilaterally.   Abdomen: Soft, nontender, no hepatosplenomegaly, no distention.  Skin: Intact without lesions or rashes.  Neurologic: Alert and oriented x 3.  Psych: Anxious. Extremities: No clubbing or cyanosis.  HEENT: Normal.    Telemetry   NSR 60s (personally reviewed)  Labs    CBC Recent Labs    02/21/18 1700  02/23/18 0216 02/24/18 0512  WBC 6.7   < > 8.0 7.2  NEUTROABS 4.6  --   --   --   HGB 9.8*   < > 9.8* 9.7*  HCT 32.3*   < > 33.0* 33.1*  MCV  98.5   < > 101.2* 101.2*  PLT 176   < > 163 158   < > = values in this interval not displayed.   Basic Metabolic Panel Recent Labs    68/15/94 0323 02/23/18 0216 02/24/18 0512  NA 144 144 145  K 3.2* 3.4* 4.2  CL 97* 96* 92*  CO2 36* 37* 43*  GLUCOSE 124* 121* 107*  BUN 22 25* 23  CREATININE 1.14* 1.34* 0.99  CALCIUM 9.1 9.0 9.6  MG 2.5*  --   --    Liver Function Tests Recent Labs    02/21/18 1700  AST 16  ALT 18  ALKPHOS 66  BILITOT 0.8  PROT 6.1*  ALBUMIN 3.3*   No results for input(s): LIPASE, AMYLASE in the last 72 hours. Cardiac Enzymes Recent Labs    02/21/18 1700  TROPONINI <0.03    BNP: BNP (last 3 results) Recent Labs    01/18/18 1341 01/25/18 0749 02/21/18 1717  BNP 254.3* 507.3* 252.7*    ProBNP (last 3 results) No results for input(s): PROBNP in the last 8760 hours.   D-Dimer No results for input(s): DDIMER in the last 72 hours. Hemoglobin A1C No results for  input(s): HGBA1C in the last 72 hours. Fasting Lipid Panel No results for input(s): CHOL, HDL, LDLCALC, TRIG, CHOLHDL, LDLDIRECT in the last 72 hours. Thyroid Function Tests No results for input(s): TSH, T4TOTAL, T3FREE, THYROIDAB in the last 72 hours.  Invalid input(s): FREET3  Other results:   Imaging    No results found.   Medications:     Scheduled Medications: . acetaZOLAMIDE  250 mg Oral BID  . amiodarone  200 mg Oral Daily  . calcium-vitamin D  2 tablet Oral Q breakfast  . furosemide  80 mg Intravenous BID  . ipratropium  0.5 mg Nebulization QID  . isosorbide mononitrate  90 mg Oral Daily  . metolazone  2.5 mg Oral Once  . metoprolol tartrate  50 mg Oral BID  . pantoprazole  40 mg Oral Daily  . potassium chloride SA  40 mEq Oral BID  . predniSONE  40 mg Oral Q breakfast  . Rivaroxaban  15 mg Oral Q supper  . simvastatin  20 mg Oral q1800  . sodium chloride flush  3 mL Intravenous Q12H  . spironolactone  25 mg Oral Daily    Infusions: . sodium  chloride      PRN Medications: sodium chloride, acetaminophen, ALPRAZolam, benzonatate, diclofenac sodium, levalbuterol, ondansetron (ZOFRAN) IV, sodium chloride flush    Assessment/Plan   1. Acute on chronic diastolic CHF: Echo in 7/19 with EF 60-65%, moderate LVH, rheumatic MV with moderate MS (mean gradient 8, MVA 1.58 cm^2), mild-moderate MR, severe TR, PASP 762 mmHg, D-shaped interventricular septum with mildly dilated RV.  Suspect significant RV failure in setting of moderate mitral stenosis and severe TR. She has been very difficult to manage due to combination of CHF, valvular heart disease and severe COPD.  She was admitted again with dyspnea after missing doses of her diuretic at SN[facility].  She remains short of breath, some is likely anxiety and some COPD, but I suspect that she is still volume overloaded.  Exam is somewhat difficult for volume.  - Lasix 80 mg IV bid and will give a dose of metolazone 2.5 x 1 again today. With elevated HCO3 will give acetazolamide 250 mg bid x 2 doses.   - Unna boots - spironolactone 25 mg daily.  - I will plan RHC after diuresis, possibly Monday, to ensure that we are getting her near euvolemic given difficulty with assessing volume status on exam.  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   2. CAD: S/P CABG. She has potential source of angina from 90% stenosis in distal LCx. The vessel is small at this point and not amenable to PCI.  - Continue Imdur.  - Continue statin.  - No ASA given Xarelto use.  3. Atrial fibrillation: Paroxysmal.  NSR currently.   - Continue amiodarone.  - Xarelto (hold Sunday for cath Monday).  4. 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  She is not wheezing.  Continue home oxygen.  She was noted to be hypercapneic yesterday and required Bipap transiently.  5. 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  She also has mild to moderate MR.  I suspect that the MR makes her a poor candidate for mitral valvuloplasty, and she also would not tolerate anesthesia well with severe COPD. She also would be a poor surgical candidate  due to frailty and severe COPD.  I think our only choice here will be medical management.  6. Lung nodule: Concerning for malignancy, plan for PET as outpatient.   She is very frail with multiple comorbidities.  Palliative care seeing her today for goals of care.  She is currently full code.   Length of Stay: 3  Marca Ancona, MD  02/24/2018, 10:17 AM  Advanced Heart Failure Team Pager [phone] (M-F; 7a - 4p)  Please contact [facility] for night-coverage after hours (4p -7a ) and weekends on [email or web address]

## 2018-02-24 NOTE — Consult Note (Signed)
Consultation Note Date: 02/24/2018   Patient Name: [name]  DOB: 1941-06-23  MRN: [id number]  Age / Sex: 77 y.o., female  PCP: Kelton Pillar, MD Referring Physician: Edwin Dada, *  Reason for Consultation: Establishing goals of care  HPI/Patient Profile: 77 y.o. female admitted on 02/21/2018 from [facility] with complaints of increased shortness of breath, increased bilateral lower extremity edema, and low hemoglobin. She has a past medical history significant for COPD with chronic hypoxic respiratory failure (home oxygen 2L/[place]), chronic diastolic CHF, CAD s/p CABG, normocytic anemia, CKD stage 3, atrial fibrillation (Xarelto), anxiety, osetoarthritis, hypertension, hyperlipidemia, and GERD. Patient was recently admitted in July for acute on chronic diastilic CHF. She received a blood transfusion and found to have a concerning lung nodule. Patient reported while at SNF worsening dyspnea and bilateral leg edema for 1-2 weeks prior to admission. Per admission reports SNF medical team reported patient's antihypertensives and diuretics were held due to hypotension on several occassions. Pulmonology remains concerned about lung nodule and the possibility of adenocarcinoma. During her ED course she was afebrile, maintaining saturations on 2L/[place]. Creatinine 1.35, hemoglobin 9.8, occult blood negative. BNP 252.7. Since admission she has been followed by the Heart Failure team. Palliative Medicine team consulted for goals of care.   Clinical Assessment and Goals of Care: I have reviewed medical records including lab results, imaging, Epic notes, and MAR, received report from the bedside RN, and assessed the patient. I then met at the bedside with patient to discuss diagnosis prognosis, GOC, EOL wishes, disposition and options. Patient is A&O x3. There is no family at the bedside. Offered patient to contact her  children to engage in conversation. Patient preferred to go ahead and with conversation as her son Legrand Como [facility]) was cutting the lawn and her husband is wheelchair bound. She reports to follow up with them post discussion. Patient denies any pain or discomfort. Does continue to complain of shortness of breath at rest with worsening with minimum exertion.   I introduced Palliative Medicine as specialized medical care for people living with serious illness. It focuses on providing relief from the symptoms and stress of a serious illness. The goal is to improve quality of life for both the patient and the family.  We discussed a brief life review of the patient. Patient is a retired Education administrator. She has been married to her husband for over 57 years and they have 3 children. Her son Legrand Como lives with her and her husband is their primary caregiver. She is of [employer]. She loves spending time with her family and friends.   As far as functional and nutritional status patient reports being at [facility] for prior to admission.  Patient states she had a fall back in March and feels that her health has continued to decline since then.  She reports she was able to actively with physical therapy the first week or so at [facility].  However she feels that her health has continued to decline over the past  1 to 2 weeks.  Patient reports she has increased weakness and shortness of breath.  She is often fatigued and is not interested in eating much because it requires so much energy and oxygenation.  She states she is often fatigued and weak due to working to breathe.  She is able to ambulate with standby assistance and a walker.  She is able to assist with ADLs however she must take frequent breaks to respiratory and fatigue status.  We discussed her current illness and what it means in the larger context of her on-going co-morbidities.  Natural disease trajectory and expectations at EOL were discussed.   Patient verbalized understanding and the criticalness of her current illness and comorbidities.  Dr. Aundra Dubin saw patient while I was at the bedside and discussed his team's current plan of care.  Patient is scheduled to have a cardiac catheterization on Monday to evaluate heart function.  Patient verbalized understanding the condition of her heart and the scheduled procedure.  Patient verbalized "I am not sure how much longer I can live like this.  I hope they are able to find out something and at least help me to be able to breathe a little bit better."  Patient verbalized understanding and awareness that at this time she is not a surgical candidate in relation to her cardiac conditions.  Support was given.  I attempted to elicit values and goals of care important to the patient.    The difference between aggressive medical intervention and comfort care was considered in light of the patient's goals of care.  Patient verbalized understanding of the difference between aggressive medical interventions and comfort care.  At this time patient would like to continue to treat the treatable, proceed with scheduled procedure on Monday and wants updates have been given based on the findings she states that we will have her and the family decide further what their next steps should be in regards to her care.  Patient does verbalize that she has no interest in returning to a SNF for any forms of rehabilitation.  She feels that she is not strong enough and that it causes her more difficulty with breathing and stress on her body when attempting to work with PT.  Advanced directives, concepts specific to code status, artifical feeding and hydration, and rehospitalization were considered and discussed.  Patient has a living well and healthcare power of attorney document at her bedside.  A copy has been made by nursing staff.  We discussed in detail the patient current wishes compared to what is documented in her living will.   Her living will specifically states that patient would not like any forms of life-sustaining measures such as CPR, fibrillation, and intubation.  It also states patient does not want any forms of artificial feedings such as PEG tube.  Patient and I discussed this in great detail as currently she is a full code.  She is aware that her daughters and son rescinded her DNR after seeing a bracelet on her arm.  Patient states her children feel that if she was a DNR that if the patient was an emergency situation the staff members would not intervene.  They are leery of her being a DNR due to previous arrest during a cardiac procedure more medical intervention was required.  Patient states daughter continues to hold on to that memory and states if she was a DNR they probably would not have intervened and she would not be here.  I attempted  to explain to the patient that in certain events the medical team may perform interventions if the emergency situation is directly in relation to the procedures.  Patient asked what would be a scenario where the medical team will follow her request as a DNR/DNI.  I explained to patient if the nursing staff walked into the room and patient had passed away because her heart had stopped they would not call the medical rescue team and life sustaining measures would not be performed such as CPR and intubation.  The medical team would then support her documented wishes and allow the patient's death to be a natural process without intervention.  At that time staff would then contact family and informed them that the patient had passed away.  Patient verbalized understanding and appreciation.  She states she is not interested in CPR or intubation however she know her family feels strongly in regards to this and feels it would be most appropriate to discuss with her son Legrand Como at least.  She requested I explained to him in detail about the DNR/DNI and may be this would clarify some of their  fears and misconceptions.   Hospice and Palliative Care services outpatient were explained and offered.  Patient verbalized understanding of the difference between both services and their levels of care.  She states she does not feel like her family will be excepting of hospice services at this time despite knowing that her cardiac and respiratory conditions are incurable.  Patient does agree to have outpatient palliative care services involved in her care at discharge.  She would like to at least wait to her procedure and hear back from cardiology before making any definitive orders or plans as she feel based on the information receive, her and her family may think differently in regards to hospice.  Support was given and patient is aware that myself and my team will continue to follow her throughout her hospitalization and offer guidance as needed.  Patient is appreciative and verbalizes understanding.  Questions and concerns were addressed. The family was encouraged to call with questions or concerns.  PMT will continue to support holistically.  After meeting with patient, I was able to speak to her son Legrand Como over the phone.  Michael and I discussed in detail patient's current condition and comorbidities.  We discussed the plans for cardiac catheterization on Monday per Dr. Aundra Dubin.  Son verbalizes understanding.  We discussed in great detail patient's full code status, her verbalized wishes, and her documented wishes on her living will.  States he disagrees with his mom in regards to her wishes to be a DNR/DNI.  I explained to him her current documentation and also my conversation with his mother in regards to her health.  Son is aware that her current cardiac and respiratory conditions are not curable and that she is not a candidate for any forms of major surgery or invasive procedures due to the risk of respiratory stress or cardiac arrest.  Son is somewhat tearful during conversation and states he does  not want his mother to live in pain or discomfort.  He states his heart tells him he would be okay with her being resuscitated and placed on life support temporarily but not long-term.  We discussed in detail her current condition and how the medical team is put in in full effort to improve her respiratory efforts.  We also discussed what an emergency situation would look like for his mom in regards to being coded and  placed on life support.  He is aware that there would be a strong possibility that she would not be able to wean off of life support due to her cardiac and respiratory status.  Son verbalized understanding and states may be his mom should be a DNR/DNI however he would like to discuss more with his Sister Juliann Pulse and his father.  He states he will notify the nursing staff or someone on the medical team once they decided to change her CODE STATUS.  Support was given.  Primary Decision Maker: Patient is able to make her own medical decisions at this time.  She is alert and oriented x3. HCPOA-Michael Dohner (son) Document provided.     SUMMARY OF RECOMMENDATIONS    Full code-patient, her son Legrand Como [facility]), and her family have requested to have a discussion and time to think about today's conversation in regards to DNR/DNI. Patient has attorney prepared living will at bedside which expresses her wishes to be DNR/DNI if she has an incurable medical condition. Document also states patient is not interested in artificial feedings such as PEG tube. Patient verbalizes understanding and that these are her wishes as document but is not prepared to honor those unless her children and husband is comfortable with her decision.   Need to treat the treatable while hospitalized.  At this time patient and family would like to continue with aggressive measures within certain limitations.  They are in agreement with cardiac procedure as recommended by heart failure team.  Reports she is not interested in returning  to SNF facility for rehabilitation.  She states that she was doing well however she feels rehab is too much for her body.  She feels that she is too weak and her body is not stable enough for the amount of energy and effort that is required to participate.  Her hopes is to return home once discharge with home health care and outpatient palliative services to follow.  Patient is requesting outpatient palliative services, I will hold off on placing referral to case manager until after procedure on Monday.  Patient states her and her family's decision may change based on information received from cardiology.  Palliative medicine team will continue to support patient, patient's family, and medical team during hospitalization.  Code Status/Advance Care Planning:  Full code   Palliative Prophylaxis:   Aspiration, Bowel Regimen, Delirium Protocol, Frequent Pain Assessment and Turn Reposition  Additional Recommendations (Limitations, Scope, Preferences):  Full Scope Treatment-taken to treat the treatable while hospitalized.  Psycho-social/Spiritual:   Desire for further Chaplaincy support:NO   Prognosis:  Unable to determine  Guarded to poor-in the setting of acute on chronic diastolic CHF, regular heart disease, severe COPD with chronic hypoxic respiratory failure, home oxygen use 2 L per nasal cannula, CAD status post CABG, atrial fibrillation, on iron deficiency anemia, decreased p.o. intake, decreased immobility, generalized weakness, shortness of breath, and newly found suspicious pulmonary nodule.  Discharge Planning: To Be Determined outpatient palliative care services recommended at minimum.      Primary Diagnoses: Present on Admission: . Acute on chronic diastolic CHF (congestive heart failure) ([place]) . Normocytic anemia . HTN (hypertension) . Chronic atrial fibrillation ([place]) . COPD (chronic obstructive pulmonary disease) ([place]) . Lung nodule . Chronic respiratory failure with  hypoxia ([place])   I have reviewed the medical record, interviewed the patient and family, and examined the patient. The following aspects are pertinent.  Past Medical History:  Diagnosis Date  . Acute parotitis 01/05/2016  .  Anxiety   . Bacteremia, escherichia coli   . Bursitis, shoulder    bilateral  . CHF (congestive heart failure) (HCC)    chronic diasotlic CHF  . Complication of anesthesia    "have trouble getting me awake sometimes; been ok latelhy" (09/24/2015)  . COPD (chronic obstructive pulmonary disease) ([place])   . Coronary heart disease 2001   s/p CABG  . Depression   . DJD (degenerative joint disease)   . Dyspnea   . GERD (gastroesophageal reflux disease)   . H/O hiatal hernia   . History of rheumatic heart disease    X 3-LAST TIME WHEN PATIENT WAS 77 YRS OLD  . Hyperlipidemia   . Hypertension   . OA (osteoarthritis)   . On home oxygen therapy    "2L; 24/7" (09/24/2015)  . PUD (peptic ulcer disease)   . Pulmonary HTN (HCC)    PASP 50-21mHg by [id number] . Sleep apnea    intolerant to CPAP  . Thyroid nodule   . Urinary incontinence   . Urine incontinence   . Varicose veins    Social History   Socioeconomic History  . Marital status: Married    Spouse name: Not on file  . Number of children: Not on file  . Years of education: Not on file  . Highest education level: Not on file  Occupational History  . Occupation: retired  SScientific laboratory technician . Financial resource strain: Not on file  . Food insecurity:    Worry: Not on file    Inability: Not on file  . Transportation needs:    Medical: Not on file    Non-medical: Not on file  Tobacco Use  . Smoking status: Former Smoker    Packs/day: 1.00    Years: 47.00    Pack years: 47.00    Types: Cigarettes    Last attempt to quit: 07/26/2007    Years since quitting: 10.5  . Smokeless tobacco: Never Used  Substance and Sexual Activity  . Alcohol use: No  . Drug use: No  . Sexual activity: Not Currently    Lifestyle  . Physical activity:    Days per week: Not on file    Minutes per session: Not on file  . Stress: Not on file  Relationships  . Social connections:    Talks on phone: Not on file    Gets together: Not on file    Attends religious service: Not on file    Active member of club or organization: Not on file    Attends meetings of clubs or organizations: Not on file    Relationship status: Not on file  Other Topics Concern  . Not on file  Social History Narrative   Husband bilateral left amputee for atheorsclerotic disease   Family History  Problem Relation Age of Onset  . Anemia Mother   . Emphysema Father   . Esophageal cancer Daughter    Scheduled Meds: . acetaZOLAMIDE  250 mg Oral BID  . amiodarone  200 mg Oral Daily  . calcium-vitamin D  2 tablet Oral Q breakfast  . furosemide  80 mg Intravenous BID  . ipratropium  0.5 mg Nebulization QID  . isosorbide mononitrate  90 mg Oral Daily  . metolazone  2.5 mg Oral Once  . metoprolol tartrate  50 mg Oral BID  . pantoprazole  40 mg Oral Daily  . potassium chloride SA  40 mEq Oral BID  . predniSONE  40 mg Oral Q breakfast  .  Rivaroxaban  15 mg Oral Q supper  . simvastatin  20 mg Oral q1800  . sodium chloride flush  3 mL Intravenous Q12H  . spironolactone  25 mg Oral Daily   Continuous Infusions: . sodium chloride     PRN Meds:.sodium chloride, acetaminophen, ALPRAZolam, benzonatate, diclofenac sodium, levalbuterol, ondansetron (ZOFRAN) IV, sodium chloride flush Medications Prior to Admission:  Prior to Admission medications   Medication Sig Start Date End Date Taking? Authorizing Provider  acetaminophen (TYLENOL) 500 MG tablet Take 1,000 mg by mouth 2 (two) times daily as needed for headache.    Yes [provider]  amiodarone (PACERONE) 200 MG tablet Take 1 tablet (200 mg total) by mouth daily. 12/11/17  Yes Larey Dresser, MD  benzonatate (TESSALON) 200 MG capsule Take 1 capsule (200 mg total) by  mouth 3 (three) times daily as needed for cough. 08/26/16  Yes Clegg, Amy D, NP  calcium-vitamin D (OSCAL WITH D) 500-200 MG-UNIT per tablet Take 2 tablets by mouth daily.    Yes [provider]  clonazePAM (KLONOPIN) 0.5 MG tablet Take 1 tablet (0.5 mg total) by mouth at bedtime. 02/01/18  Yes Oswald Hillock, MD  diclofenac sodium (VOLTAREN) 1 % GEL Apply 2 g topically daily as needed (pain). 02/01/18  Yes Oswald Hillock, MD  docusate sodium (COLACE) 100 MG capsule Take 200 mg by mouth as needed for mild constipation.   Yes [provider]  ferrous gluconate (FERGON) 324 MG tablet Take 324 mg by mouth daily.   Yes [provider]  ipratropium (ATROVENT) 0.02 % nebulizer solution Take 2.5 mLs (0.5 mg total) by nebulization 4 (four) times daily. Dx: J44.9 01/15/18  Yes Parrett, Tammy S, NP  isosorbide mononitrate (IMDUR) 30 MG 24 hr tablet Take 90 mg by mouth daily.   Yes [provider]  loratadine (CLARITIN) 10 MG tablet Take 10 mg by mouth daily.    Yes [provider]  metoprolol tartrate (LOPRESSOR) 50 MG tablet Take 1 tablet (50 mg total) by mouth 2 (two) times daily. 09/01/17  Yes Larey Dresser, MD  NITROSTAT 0.4 MG SL tablet place 1 tablet under the tongue if needed every 5 minutes for chest pain for 3 doses IF NO RELIEF AFTER 3RD DOSE CALL PRESCRIBER OR 911. 01/21/16  Yes Turner, Eber Hong, MD  omeprazole (PRILOSEC) 40 MG capsule Take 40 mg by mouth daily. 11/22/17  Yes [provider]  potassium chloride SA (K-DUR,KLOR-CON) 20 MEQ tablet TAKE 2 TABLETS BY MOUTH TWO TIMES DAILY Patient taking differently: TAKE 40 MEQ BY MOUTH TWO TIMES DAILY 11/28/16  Yes Bensimhon, Shaune Pascal, MD  Rivaroxaban (XARELTO) 15 MG TABS tablet Take 1 tablet (15 mg total) by mouth daily with supper. 10/26/17  Yes Larey Dresser, MD  simvastatin (ZOCOR) 20 MG tablet TAKE 3 TABLETS BY MOUTH  DAILY AT 6 PM 05/30/17  Yes Larey Dresser, MD  spironolactone (ALDACTONE) 25 MG tablet  TAKE 1 TABLET BY MOUTH  DAILY Patient taking differently: TAKE 25MG BY MOUTH  DAILY 05/30/17  Yes Larey Dresser, MD  torsemide (DEMADEX) 20 MG tablet Take 4 tablets (80 mg total) by mouth 2 (two) times daily. Patient taking differently: Take 40-80 mg by mouth See admin instructions. Take 4 tablets (3m) every morning. Then take 2 tablets (429m every evening. 02/12/18  Yes McLarey DresserMD  OXYGEN Inhale 2-3 L into the lungs continuous. At rest 2 liters  Moving around 3 liters  [provider]   Allergies  Allergen Reactions  . Lisinopril Cough    Developed persistent dry cough for a month.   . Tape Other (See Comments)    Bleeding  . Cefpodoxime Other (See Comments)    Yeast infections   . Codeine Other (See Comments)    Anxious/ jittery  . Lipitor [Atorvastatin] Other (See Comments)    Made joint start hurting/ Muscle aches  . Other Swelling    Venholin HFA  . Penicillins Other (See Comments)    childhood reaction Has patient had a PCN reaction causing immediate rash, facial/tongue/throat swelling, SOB or lightheadedness with hypotension: unknown Has patient had a PCN reaction causing severe rash involving mucus membranes or skin necrosis: No Has patient had a PCN reaction that required hospitalization No Has patient had a PCN reaction occurring within the last 10 years: No If all of the above answers are "NO", then may proceed with Cephalosporin use.   Enid Cutter [Kdc:Albuterol] Other (See Comments)    "jittery"  . Xopenex [Levalbuterol] Other (See Comments)    Made mouth and throat sore  . Anoro Ellipta [Umeclidinium-Vilanterol] Itching  . Clindamycin Other (See Comments)    Unknown   . Hydrocodone-Acetaminophen Anxiety  . Latex Itching and Rash   Review of Systems  Constitutional: Positive for fatigue.  Respiratory: Positive for cough and shortness of breath.   Cardiovascular: Positive for leg swelling.  Neurological: Positive for weakness.  All other  systems reviewed and are negative.   Physical Exam  Constitutional: She is oriented to person, place, and time. Vital signs are normal. She appears well-developed. She is cooperative. She has a sickly appearance.  Frail in appearance   Cardiovascular: Normal rate, regular rhythm, normal heart sounds and normal pulses.  Pulmonary/Chest: She has decreased breath sounds.  Shortness of breath  Abdominal: Soft. Normal appearance.  Musculoskeletal:  Bilateral lower extremity edema, generalized weakness  Neurological: She is alert and oriented to person, place, and time.  Skin: Skin is warm and dry. Bruising noted.  Psychiatric: She has a normal mood and affect. Her speech is normal and behavior is normal. Judgment and thought content normal. Cognition and memory are normal.  Nursing note and vitals reviewed.   Vital Signs: BP 110/70 (BP Location: Left Arm)   Pulse 63   Temp 98.1 F (36.7 C) (Oral)   Resp (!) 26   Ht _0  (1.676 m)   Wt 88.4 kg (194 lb 14.4 oz)   SpO2 98%   BMI 31.46 kg/m  Pain Scale: 0-10   Pain Score: 0-No pain   SpO2: SpO2: 98 % O2 Device:SpO2: 98 % O2 Flow Rate: .O2 Flow Rate (L/min): 2 L/min  IO: Intake/output summary:   Intake/Output Summary (Last 24 hours) at 02/24/2018 1053 Last data filed at 02/24/2018 0646 Gross per 24 hour  Intake 0 ml  Output 2125 ml  Net -2125 ml    LBM: Last BM Date: (pt unsure) Baseline Weight: Weight: 92.1 kg (203 lb) Most recent weight: Weight: 88.4 kg (194 lb 14.4 oz)     Palliative Assessment/Data: PPS 30%   Time In: 0915 Time Out: 1030 Time Total: 75 min.   Greater than 50%  of this time was spent counseling and coordinating care related to the above assessment and plan.  Signed by:  Alda Lea, NP-BC Palliative Medicine Team  Phone: [phone] Fax: [phone] Pager: [phone] Amion: Bjorn Pippin    Please contact Palliative Medicine Team phone at [phone] for questions  and concerns.  For  individual provider: See Shea Evans

## 2018-02-25 LAB — BASIC METABOLIC PANEL
ANION GAP: 14 (ref 5–15)
BUN: 31 mg/dL — ABNORMAL HIGH (ref 8–23)
CALCIUM: 9.6 mg/dL (ref 8.9–10.3)
CO2: 38 mmol/L — ABNORMAL HIGH (ref 22–32)
Chloride: 87 mmol/L — ABNORMAL LOW (ref 98–111)
Creatinine, Ser: 1.21 mg/dL — ABNORMAL HIGH (ref 0.44–1.00)
GFR, EST AFRICAN AMERICAN: 48 mL/min — AB (ref >60)
GFR, EST NON AFRICAN AMERICAN: 42 mL/min — AB (ref >60)
Glucose, Bld: 108 mg/dL — ABNORMAL HIGH (ref 70–99)
POTASSIUM: 3.3 mmol/L — AB (ref 3.5–5.1)
SODIUM: 139 mmol/L (ref 135–145)

## 2018-02-25 MED ORDER — POTASSIUM CHLORIDE CRYS ER 20 MEQ PO TBCR
40.0000 meq | EXTENDED_RELEASE_TABLET | Freq: Once | ORAL | Status: AC
  Administered 2018-02-25: 40 meq via ORAL
  Filled 2018-02-25: qty 2

## 2018-02-25 MED ORDER — ASPIRIN 81 MG PO CHEW
81.0000 mg | CHEWABLE_TABLET | ORAL | Status: AC
  Administered 2018-02-26: 81 mg via ORAL
  Filled 2018-02-25: qty 1

## 2018-02-25 MED ORDER — SODIUM CHLORIDE 0.9 % IV SOLN
INTRAVENOUS | Status: DC
  Administered 2018-02-26: 01:00:00 via INTRAVENOUS

## 2018-02-25 MED ORDER — TORSEMIDE 20 MG PO TABS
80.0000 mg | ORAL_TABLET | Freq: Two times a day (BID) | ORAL | Status: DC
  Administered 2018-02-25: 80 mg via ORAL
  Filled 2018-02-25: qty 4

## 2018-02-25 NOTE — Progress Notes (Signed)
Patient refused CPAP for the night  

## 2018-02-25 NOTE — Progress Notes (Signed)
Patient ID: [name], female   DOB: 11/25/1940, 77 y.o.   MRN: [id number]     Advanced Heart Failure Rounding Note  PCP-Cardiologist: No primary care provider on file.   Subjective:    Good diuresis again yesterday, weight continues to fall.  Still short of breath.  Breathing better.  Creatinine up slightly to 1.2 with HCO3 down to 38.   Objective:   Weight Range: 183 lb 1.6 oz (83.1 kg) Body mass index is 29.55 kg/m.   Vital Signs:   Temp:  [97.4 F (36.3 C)-98.8 F (37.1 C)] 97.8 F (36.6 C) (08/04 0723) Pulse Rate:  [54-76] 54 (08/04 0723) Resp:  [17-26] 18 (08/04 0723) BP: (104-124)/(41-53) 122/51 (08/04 0723) SpO2:  [78 %-100 %] 100 % (08/04 0914) Weight:  [183 lb 1.6 oz (83.1 kg)] 183 lb 1.6 oz (83.1 kg) (08/04 0605) Last BM Date: 02/24/18  Weight change: Filed Weights   02/23/18 2013 02/24/18 0600 02/25/18 0605  Weight: 196 lb 6.4 oz (89.1 kg) 194 lb 14.4 oz (88.4 kg) 183 lb 1.6 oz (83.1 kg)    Intake/Output:   Intake/Output Summary (Last 24 hours) at 02/25/2018 1006 Last data filed at 02/25/2018 0900 Gross per 24 hour  Intake 903 ml  Output 3825 ml  Net -2922 ml      Physical Exam    General: NAD Neck: No JVD, no thyromegaly or thyroid nodule.  Lungs: Clear to auscultation bilaterally with normal respiratory effort. CV: Nondisplaced PMI.  Heart regular S1/S2, no S3/S4, 1/6 SEM RUSB.  1+ right ankle edema.   Abdomen: Soft, nontender, no hepatosplenomegaly, no distention.  Skin: Intact without lesions or rashes.  Neurologic: Alert and oriented x 3.  Psych: Normal affect. Extremities: No clubbing or cyanosis.  HEENT: Normal.    Telemetry   NSR 60s (personally reviewed)  Labs    CBC Recent Labs    02/23/18 0216 02/24/18 0512  WBC 8.0 7.2  HGB 9.8* 9.7*  HCT 33.0* 33.1*  MCV 101.2* 101.2*  PLT 163 158   Basic Metabolic Panel Recent Labs    02/24/18 0512 02/25/18 0408  NA 145 139  K 4.2 3.3*  CL 92* 87*  CO2 43* 38*  GLUCOSE 107*  108*  BUN 23 31*  CREATININE 0.99 1.21*  CALCIUM 9.6 9.6   Liver Function Tests No results for input(s): AST, ALT, ALKPHOS, BILITOT, PROT, ALBUMIN in the last 72 hours. No results for input(s): LIPASE, AMYLASE in the last 72 hours. Cardiac Enzymes No results for input(s): CKTOTAL, CKMB, CKMBINDEX, TROPONINI in the last 72 hours.  BNP: BNP (last 3 results) Recent Labs    01/18/18 1341 01/25/18 0749 02/21/18 1717  BNP 254.3* 507.3* 252.7*    ProBNP (last 3 results) No results for input(s): PROBNP in the last 8760 hours.   D-Dimer No results for input(s): DDIMER in the last 72 hours. Hemoglobin A1C No results for input(s): HGBA1C in the last 72 hours. Fasting Lipid Panel No results for input(s): CHOL, HDL, LDLCALC, TRIG, CHOLHDL, LDLDIRECT in the last 72 hours. Thyroid Function Tests No results for input(s): TSH, T4TOTAL, T3FREE, THYROIDAB in the last 72 hours.  Invalid input(s): FREET3  Other results:   Imaging    No results found.   Medications:     Scheduled Medications: . amiodarone  200 mg Oral Daily  . [START ON 02/26/2018] aspirin  81 mg Oral Pre-Cath  . calcium-vitamin D  2 tablet Oral Q breakfast  . ipratropium  0.5 mg Nebulization   QID  . isosorbide mononitrate  90 mg Oral Daily  . metoprolol tartrate  50 mg Oral BID  . pantoprazole  40 mg Oral Daily  . potassium chloride SA  40 mEq Oral BID  . predniSONE  40 mg Oral Q breakfast  . simvastatin  20 mg Oral q1800  . sodium chloride flush  3 mL Intravenous Q12H  . spironolactone  25 mg Oral Daily  . torsemide  80 mg Oral BID    Infusions: . sodium chloride    . [START ON 02/26/2018] sodium chloride      PRN Medications: sodium chloride, acetaminophen, ALPRAZolam, benzonatate, diclofenac sodium, levalbuterol, ondansetron (ZOFRAN) IV, sodium chloride flush    Assessment/Plan   1. Acute on chronic diastolic CHF: Echo in 7/19 with EF 60-65%, moderate LVH, rheumatic MV with moderate MS (mean  gradient 8, MVA 1.58 cm^2), mild-moderate MR, severe TR, PASP 762 mmHg, D-shaped interventricular septum with mildly dilated RV.  Suspect significant RV failure in setting of moderate mitral stenosis and severe TR. She has been very difficult to manage due to combination of CHF, valvular heart disease and severe COPD.  She was admitted again with dyspnea after missing doses of her diuretic at SNF.  She has diuresed well with Lasix IV + metolazone + acetazolamide.  She does not look volume overloaded by exam today.   - Stop IV Lasix, metolazone, and acetazolamide.  Start on torsemide 80 mg po bid this evening.    - spironolactone 25 mg daily.  - I will plan RHC on Monday, to ensure that we are getting her near euvolemic given difficulty with assessing volume status on exam.  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  We discussed risks/benefits and she agrees to proceed.  2. CAD: S/P CABG. She has potential source of angina from 90% stenosis in distal LCx. The vessel is small at this point and not amenable to PCI.  - Continue Imdur.  - Continue statin.  - No ASA given Xarelto use.  3. Atrial fibrillation: Paroxysmal.  NSR currently.   - Continue amiodarone.  - Xarelto (hold today for cath Monday).  4. 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  She is not wheezing.  Continue home oxygen.   5. 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  She also has mild to moderate MR.  I suspect that the MR makes her a poor candidate for mitral valvuloplasty, and she also would not tolerate anesthesia well with severe COPD. She also would be a poor surgical candidate due to frailty and severe COPD.  I think our only choice here will be medical management.  6. Lung nodule: Concerning for malignancy, plan for PET as outpatient.   She is very frail with  multiple comorbidities.  Palliative care saw her today for goals of care.  She is currently full code but family to discuss this further.   Length of Stay: 4  Monica Barbee, MD  02/25/2018, 10:06 AM  Advanced Heart Failure Team Pager [phone] (M-F; 7a - 4p)  Please contact [facility] for night-coverage after hours (4p -7a ) and weekends on [email or web address]  

## 2018-02-25 NOTE — Progress Notes (Signed)
RT called to pt's room. Pt stated the CPAP pressure felt like it was not enough.  RT increased EPAP pressure and pt stated it was better.  Pt is now resting comfortable and SP02 is 96%. Will continue to monitor.

## 2018-02-25 NOTE — Progress Notes (Signed)
RT was called to place pt on CPAP.  RT asked pt if she wanted to wear her CPAP and pt stated she wanted to try wearing it tonight. Rt placed pt on CPAP and bled 2L of 02.  Pt SP02 is 93% and pt looks comfortable.

## 2018-02-25 NOTE — Progress Notes (Signed)
PROGRESS NOTE    [name]  ZOX:[id number] DOB: Feb 26, 1941 DOA: 02/21/2018 PCP: Maurice Small, MD      Brief Narrative:  [name] is a 77 y.o. 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   Had recently been admitted to the hospital for dyspnea.  Treated with IV steroids and nebs as well as IV Lasix, improved and was discharged (although she reports leg swelling *worsened* in hospital).  For about a week at [facility] she was doing well, then last 7 days, started to have worsening dyspnea, weight gain.      Assessment & Plan:  Acute on chronic diasotlic CHF Presented with BNP lower than previous but effusion on CXR, 3+ edema.  Diuresed now 7L.  EF 65% earlier this month.  -2.4 L again yesterday, weights down.  Aceta Sulamyd given yesterday and bicarb down.  Creatinine slightly up today with addition of acetazolamide and metolazone.  Potassium low.  -Defer diuretic dosing to cardiology, Lasix, metolazone, and Aceta Sulamyd was stopped today, home torsemide was restarted  -Continue metoprolol, spironolactone -Continue K, additional dose potassium today -Strict I/Os, daily weights, telemetry  -Daily monitoring renal function -Consult to CHF team, appreciate cares -Low threshold to stop Imdur if needed for BP   Acute on chronic hypoxic respiratory failure Untreated sleep apnea Pulmonary hypertension  After hypercapnic respiratory failure on 8/2-3, patient was BiPAP'd briefly, improved.  States she was tested for sleep apnea years ago, had it.  Tested again 2 years ago, didn't.  Now however, here, she desaturates with sleep.    -Strictly limit SPO2 88 to 92% -Recommend CPAP at night as tolerated  Coronary artery disease secondary prevention Hypertension BP remains normal -Continue Imdur, metoprolol, statin  Paroxysmal atrial  fibrillation CHA2DS2-Vasc 6, on Xarelto. -Continue amiodarone, metoprolol, Xarelto  Severe COPD, now with exacerbation Had hypercapnic respiratory failure on 8/2, afterwards, wheezy.  Hx of being intolerant to LAMAs, LABAs, and ICSs. Today no wheezing. -Continue, day 2 of 5 -Add Xopenex as needed -Continue Atrovent 4 times daily  Pulmonary hypertension  Normocytic anemia, chronic iron deficiency There was a report that her Hgb had dropped, this was incorrect, it is stable relative to her baseline ~10 g/dL.  Hgb no change.  Hypokalemia -Continue potassium daily with Lasix -Add potassium dose today  Other medications -Continue PPI        DVT prophylaxis: N/A on Xarelto Code Status: FULL Family Communication: GrandDaughter at bedside MDM and disposition Plan: The below labs and imaging studies were personally reviewed and summarized above.  Medication management as above.  Cardiology are directing diuresis this weekend, plan for RHC on Monday.  Relative care have seen.    PT to evaluate.   Consultants:   Cardiology  Procedures:   None  Antimicrobials:   None    Subjective: Wheezing improved, breathing improved, no longer tachypneic.  Anxiety is better.  No chest pain, leg swelling is improved, overall better.  Still significantly short of breath with any kind of exertion.  But stable for transfer to telemetry floor.  Objective: Vitals:   02/25/18 0914 02/25/18 1012 02/25/18 1144 02/25/18 1401  BP:  (!) 115/49 (!) 130/47   Pulse:  64 61   Resp:      Temp:   98.1 F (36.7 C)   TempSrc:   Oral  SpO2: 100%   92%  Weight:      Height:        Intake/Output Summary (Last 24 hours) at 02/25/2018 1629 Last data filed at 02/25/2018 1200 Gross per 24 hour  Intake 906 ml  Output 1850 ml  Net -944 ml   Filed Weights   02/23/18 2013 02/24/18 0600 02/25/18 0605  Weight: 89.1 kg (196 lb 6.4 oz) 88.4 kg (194 lb 14.4 oz) 83.1 kg (183 lb 1.6 oz)     Examination: General appearance: Obese adult female, sitting in recliner, appears weak but no longer dyspneic, she is interactive, no acute distress HEENT: Anicteric, conjunctival pink, lids and lashes normal.  No nasal deformity, discharge, or epistaxis.  Lips moist, dentures in place.  Oropharynx normal, no oral lesions, hearing normal. Skin:, No suspicious rashes or lesions, skin warm and dry Cardiac: RRR, soft systolic murmur, no lower extremity pitting edema Respiratory: Respiratory rate somewhat shallow and increased, but no respiratory distress, no rales, no wheezing Abdomen: Abdomen is soft without tenderness to palpation, guarding, or hepatosplenomegaly. MSK: No deformities or effusions Neuro: Awake and alert, somewhat tired but not sluggish.  Extraocular movements intact, moves upper extremities with normal strength and coordination.  Speech fluent. Psych: Awake and alert, oriented to person, place, time.  Judgment and insight appear normal    Data Reviewed: I have personally reviewed following labs and imaging studies:  CBC: Recent Labs  Lab 02/21/18 1700 02/22/18 0323 02/23/18 0216 02/24/18 0512  WBC 6.7 7.5 8.0 7.2  NEUTROABS 4.6  --   --   --   HGB 9.8* 10.4* 9.8* 9.7*  HCT 32.3* 35.1* 33.0* 33.1*  MCV 98.5 99.4 101.2* 101.2*  PLT 176 170 163 158   Basic Metabolic Panel: Recent Labs  Lab 02/21/18 1700 02/22/18 0323 02/23/18 0216 02/24/18 0512 02/25/18 0408  NA 141 144 144 145 139  K 3.4* 3.2* 3.4* 4.2 3.3*  CL 95* 97* 96* 92* 87*  CO2 35* 36* 37* 43* 38*  GLUCOSE 99 124* 121* 107* 108*  BUN 26* 22 25* 23 31*  CREATININE 1.35* 1.14* 1.34* 0.99 1.21*  CALCIUM 9.3 9.1 9.0 9.6 9.6  MG  --  2.5*  --   --   --    GFR: Estimated Creatinine Clearance: 41.6 mL/min (A) (by C-G formula based on SCr of 1.21 mg/dL (H)). Liver Function Tests: Recent Labs  Lab 02/21/18 1700  AST 16  ALT 18  ALKPHOS 66  BILITOT 0.8  PROT 6.1*  ALBUMIN 3.3*   No results  for input(s): LIPASE, AMYLASE in the last 168 hours. No results for input(s): AMMONIA in the last 168 hours. Coagulation Profile: No results for input(s): INR, PROTIME in the last 168 hours. Cardiac Enzymes: Recent Labs  Lab 02/21/18 1700  TROPONINI <0.03   BNP (last 3 results) No results for input(s): PROBNP in the last 8760 hours. HbA1C: No results for input(s): HGBA1C in the last 72 hours. CBG: No results for input(s): GLUCAP in the last 168 hours. Lipid Profile: No results for input(s): CHOL, HDL, LDLCALC, TRIG, CHOLHDL, LDLDIRECT in the last 72 hours. Thyroid Function Tests: No results for input(s): TSH, T4TOTAL, FREET4, T3FREE, THYROIDAB in the last 72 hours. Anemia Panel: No results for input(s): VITAMINB12, FOLATE, FERRITIN, TIBC, IRON, RETICCTPCT in the last 72 hours. Urine analysis:    Component Value Date/Time   COLORURINE STRAW (A) 02/21/2018 2038   APPEARANCEUR CLEAR 02/21/2018 2038   LABSPEC 1.006 02/21/2018 2038   PHURINE 6.0  02/21/2018 2038   GLUCOSEU NEGATIVE 02/21/2018 2038   HGBUR NEGATIVE 02/21/2018 2038   BILIRUBINUR NEGATIVE 02/21/2018 2038   KETONESUR NEGATIVE 02/21/2018 2038   PROTEINUR NEGATIVE 02/21/2018 2038   NITRITE NEGATIVE 02/21/2018 2038   LEUKOCYTESUR NEGATIVE 02/21/2018 2038   Sepsis Labs: @LABRCNTIP (procalcitonin:4,lacticacidven:4)  ) Recent Results (from the past 240 hour(s))  MRSA PCR Screening     Status: None   Collection Time: 02/21/18  9:38 PM  Result Value Ref Range Status   MRSA by PCR NEGATIVE NEGATIVE Final    Comment:        The GeneXpert MRSA Assay (FDA approved for NASAL specimens only), is one component of a comprehensive MRSA colonization surveillance program. It is not intended to diagnose MRSA infection nor to guide or monitor treatment for MRSA infections. Performed at [facility], [address], [place], [place] [address]          Radiology Studies: No results found.      Scheduled  Meds: . amiodarone  200 mg Oral Daily  . [START ON 02/26/2018] aspirin  81 mg Oral Pre-Cath  . calcium-vitamin D  2 tablet Oral Q breakfast  . ipratropium  0.5 mg Nebulization QID  . isosorbide mononitrate  90 mg Oral Daily  . metoprolol tartrate  50 mg Oral BID  . pantoprazole  40 mg Oral Daily  . potassium chloride SA  40 mEq Oral BID  . potassium chloride  40 mEq Oral Once  . predniSONE  40 mg Oral Q breakfast  . simvastatin  20 mg Oral q1800  . sodium chloride flush  3 mL Intravenous Q12H  . spironolactone  25 mg Oral Daily  . torsemide  80 mg Oral BID   Continuous Infusions: . sodium chloride    . [START ON 02/26/2018] sodium chloride       LOS: 4 days    Time spent: 25 minutes    Alberteen Sam, MD [facility] 02/25/2018, 4:29 PM     Pager [phone] --- please page though AMION:  [email or web address] If 7PM-7AM, please contact night-coverage

## 2018-02-25 NOTE — H&P (View-Only) (Signed)
Patient ID: [name], female   DOB: 08-20-1940, 77 y.o.   MRN: [id number]     Advanced Heart Failure Rounding Note  PCP-Cardiologist: No primary care provider on file.   Subjective:    Good diuresis again yesterday, weight continues to fall.  Still short of breath.  Breathing better.  Creatinine up slightly to 1.2 with HCO3 down to 38.   Objective:   Weight Range: 183 lb 1.6 oz (83.1 kg) Body mass index is 29.55 kg/m.   Vital Signs:   Temp:  [97.4 F (36.3 C)-98.8 F (37.1 C)] 97.8 F (36.6 C) (08/04 0723) Pulse Rate:  [54-76] 54 (08/04 0723) Resp:  [17-26] 18 (08/04 0723) BP: (104-124)/(41-53) 122/51 (08/04 0723) SpO2:  [78 %-100 %] 100 % (08/04 0914) Weight:  [183 lb 1.6 oz (83.1 kg)] 183 lb 1.6 oz (83.1 kg) (08/04 0605) Last BM Date: 02/24/18  Weight change: Filed Weights   02/23/18 2013 02/24/18 0600 02/25/18 0605  Weight: 196 lb 6.4 oz (89.1 kg) 194 lb 14.4 oz (88.4 kg) 183 lb 1.6 oz (83.1 kg)    Intake/Output:   Intake/Output Summary (Last 24 hours) at 02/25/2018 1006 Last data filed at 02/25/2018 0900 Gross per 24 hour  Intake 903 ml  Output 3825 ml  Net -2922 ml      Physical Exam    General: NAD Neck: No JVD, no thyromegaly or thyroid nodule.  Lungs: Clear to auscultation bilaterally with normal respiratory effort. CV: Nondisplaced PMI.  Heart regular S1/S2, no S3/S4, 1/6 SEM RUSB.  1+ right ankle edema.   Abdomen: Soft, nontender, no hepatosplenomegaly, no distention.  Skin: Intact without lesions or rashes.  Neurologic: Alert and oriented x 3.  Psych: Normal affect. Extremities: No clubbing or cyanosis.  HEENT: Normal.    Telemetry   NSR 60s (personally reviewed)  Labs    CBC Recent Labs    02/23/18 0216 02/24/18 0512  WBC 8.0 7.2  HGB 9.8* 9.7*  HCT 33.0* 33.1*  MCV 101.2* 101.2*  PLT 163 158   Basic Metabolic Panel Recent Labs    05/24/58 0512 02/25/18 0408  NA 145 139  K 4.2 3.3*  CL 92* 87*  CO2 43* 38*  GLUCOSE 107*  108*  BUN 23 31*  CREATININE 0.99 1.21*  CALCIUM 9.6 9.6   Liver Function Tests No results for input(s): AST, ALT, ALKPHOS, BILITOT, PROT, ALBUMIN in the last 72 hours. No results for input(s): LIPASE, AMYLASE in the last 72 hours. Cardiac Enzymes No results for input(s): CKTOTAL, CKMB, CKMBINDEX, TROPONINI in the last 72 hours.  BNP: BNP (last 3 results) Recent Labs    01/18/18 1341 01/25/18 0749 02/21/18 1717  BNP 254.3* 507.3* 252.7*    ProBNP (last 3 results) No results for input(s): PROBNP in the last 8760 hours.   D-Dimer No results for input(s): DDIMER in the last 72 hours. Hemoglobin A1C No results for input(s): HGBA1C in the last 72 hours. Fasting Lipid Panel No results for input(s): CHOL, HDL, LDLCALC, TRIG, CHOLHDL, LDLDIRECT in the last 72 hours. Thyroid Function Tests No results for input(s): TSH, T4TOTAL, T3FREE, THYROIDAB in the last 72 hours.  Invalid input(s): FREET3  Other results:   Imaging    No results found.   Medications:     Scheduled Medications: . amiodarone  200 mg Oral Daily  . [START ON 02/26/2018] aspirin  81 mg Oral Pre-Cath  . calcium-vitamin D  2 tablet Oral Q breakfast  . ipratropium  0.5 mg Nebulization  QID  . isosorbide mononitrate  90 mg Oral Daily  . metoprolol tartrate  50 mg Oral BID  . pantoprazole  40 mg Oral Daily  . potassium chloride SA  40 mEq Oral BID  . predniSONE  40 mg Oral Q breakfast  . simvastatin  20 mg Oral q1800  . sodium chloride flush  3 mL Intravenous Q12H  . spironolactone  25 mg Oral Daily  . torsemide  80 mg Oral BID    Infusions: . sodium chloride    . [START ON 02/26/2018] sodium chloride      PRN Medications: sodium chloride, acetaminophen, ALPRAZolam, benzonatate, diclofenac sodium, levalbuterol, ondansetron (ZOFRAN) IV, sodium chloride flush    Assessment/Plan   1. Acute on chronic diastolic CHF: Echo in 7/19 with EF 60-65%, moderate LVH, rheumatic MV with moderate MS (mean  gradient 8, MVA 1.58 cm^2), mild-moderate MR, severe TR, PASP 762 mmHg, D-shaped interventricular septum with mildly dilated RV.  Suspect significant RV failure in setting of moderate mitral stenosis and severe TR. She has been very difficult to manage due to combination of CHF, valvular heart disease and severe COPD.  She was admitted again with dyspnea after missing doses of her diuretic at SNF.  She has diuresed well with Lasix IV + metolazone + acetazolamide.  She does not look volume overloaded by exam today.   - Stop IV Lasix, metolazone, and acetazolamide.  Start on torsemide 80 mg po bid this evening.    - spironolactone 25 mg daily.  - I will plan RHC on Monday, to ensure that we are getting her near euvolemic given difficulty with assessing volume status on exam.  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  We discussed risks/benefits and she agrees to proceed.  2. CAD: S/P CABG. She has potential source of angina from 90% stenosis in distal LCx. The vessel is small at this point and not amenable to PCI.  - Continue Imdur.  - Continue statin.  - No ASA given Xarelto use.  3. Atrial fibrillation: Paroxysmal.  NSR currently.   - Continue amiodarone.  - Xarelto (hold today for cath Monday).  4. 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  She is not wheezing.  Continue home oxygen.   5. 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  She also has mild to moderate MR.  I suspect that the MR makes her a poor candidate for mitral valvuloplasty, and she also would not tolerate anesthesia well with severe COPD. She also would be a poor surgical candidate due to frailty and severe COPD.  I think our only choice here will be medical management.  6. Lung nodule: Concerning for malignancy, plan for PET as outpatient.   She is very frail with  multiple comorbidities.  Palliative care saw her today for goals of care.  She is currently full code but family to discuss this further.   Length of Stay: 4  Monica Ancona, MD  02/25/2018, 10:06 AM  Advanced Heart Failure Team Pager [phone] (M-F; 7a - 4p)  Please contact [facility] for night-coverage after hours (4p -7a ) and weekends on [email or web address]

## 2018-02-26 ENCOUNTER — Ambulatory Visit (HOSPITAL_COMMUNITY): Admit: 2018-02-26 | Payer: Medicare Other | Admitting: Cardiology

## 2018-02-26 ENCOUNTER — Encounter (HOSPITAL_COMMUNITY)
Admission: EM | Disposition: A | Discharge status: Home Health | Payer: Self-pay | Source: Home / Self Care | Attending: Family Medicine

## 2018-02-26 ENCOUNTER — Encounter (HOSPITAL_COMMUNITY): Payer: Self-pay | Admitting: Cardiology

## 2018-02-26 DIAGNOSIS — I509 Heart failure, unspecified: Secondary | ICD-10-CM

## 2018-02-26 HISTORY — PX: RIGHT HEART CATH: CATH118263

## 2018-02-26 LAB — POCT I-STAT 3, VENOUS BLOOD GAS (G3P V)
ACID-BASE EXCESS: 15 mmol/L — AB (ref 0.0–2.0)
Acid-Base Excess: 17 mmol/L — ABNORMAL HIGH (ref 0.0–2.0)
BICARBONATE: 40.7 mmol/L — AB (ref 20.0–28.0)
Bicarbonate: 43.2 mmol/L — ABNORMAL HIGH (ref 20.0–28.0)
O2 SAT: 55 %
O2 Saturation: 63 %
PCO2 VEN: 60.5 mmHg — AB (ref 44.0–60.0)
PO2 VEN: 32 mmHg (ref 32.0–45.0)
TCO2: 45 mmol/L — AB (ref 22–32)
TCO2: 42 mmol/L — ABNORMAL HIGH (ref 22–32)
pCO2, Ven: 57.8 mmHg (ref 44.0–60.0)
pH, Ven: 7.456 — ABNORMAL HIGH (ref 7.250–7.430)
pH, Ven: 7.462 — ABNORMAL HIGH (ref 7.250–7.430)
pO2, Ven: 29 mmHg — CL (ref 32.0–45.0)

## 2018-02-26 LAB — BASIC METABOLIC PANEL
ANION GAP: 13 (ref 5–15)
BUN: 43 mg/dL — ABNORMAL HIGH (ref 8–23)
CO2: 40 mmol/L — AB (ref 22–32)
Calcium: 9.9 mg/dL (ref 8.9–10.3)
Chloride: 88 mmol/L — ABNORMAL LOW (ref 98–111)
Creatinine, Ser: 1.48 mg/dL — ABNORMAL HIGH (ref 0.44–1.00)
GFR calc non Af Amer: 33 mL/min — ABNORMAL LOW (ref >60)
GFR, EST AFRICAN AMERICAN: 38 mL/min — AB (ref >60)
GLUCOSE: 120 mg/dL — AB (ref 70–99)
POTASSIUM: 3.6 mmol/L (ref 3.5–5.1)
Sodium: 141 mmol/L (ref 135–145)

## 2018-02-26 SURGERY — RIGHT HEART CATH
Anesthesia: LOCAL

## 2018-02-26 MED ORDER — HEPARIN (PORCINE) IN NACL 1000-0.9 UT/500ML-% IV SOLN
INTRAVENOUS | Status: AC
  Filled 2018-02-26: qty 1000

## 2018-02-26 MED ORDER — LIDOCAINE HCL (PF) 1 % IJ SOLN
INTRAMUSCULAR | Status: AC
  Filled 2018-02-26: qty 30

## 2018-02-26 MED ORDER — HEPARIN (PORCINE) IN NACL 1000-0.9 UT/500ML-% IV SOLN
INTRAVENOUS | Status: DC | PRN
  Administered 2018-02-26: 500 mL

## 2018-02-26 MED ORDER — TORSEMIDE 20 MG PO TABS
80.0000 mg | ORAL_TABLET | Freq: Two times a day (BID) | ORAL | Status: DC
  Administered 2018-02-26 – 2018-02-27 (×2): 80 mg via ORAL
  Filled 2018-02-26 (×2): qty 4

## 2018-02-26 MED ORDER — RIVAROXABAN 15 MG PO TABS
15.0000 mg | ORAL_TABLET | Freq: Every day | ORAL | Status: DC
  Administered 2018-02-26: 15 mg via ORAL
  Filled 2018-02-26: qty 1

## 2018-02-26 MED ORDER — LIDOCAINE HCL (PF) 1 % IJ SOLN
INTRAMUSCULAR | Status: DC | PRN
  Administered 2018-02-26: 2 mL

## 2018-02-26 SURGICAL SUPPLY — 7 items
CATH BALLN WEDGE 5F 110CM (CATHETERS) ×2 IMPLANT
HOVERMATT SINGLE USE (MISCELLANEOUS) ×2 IMPLANT
KIT HEART LEFT (KITS) ×2 IMPLANT
PACK CARDIAC CATHETERIZATION (CUSTOM PROCEDURE TRAY) ×2 IMPLANT
SHEATH GLIDE SLENDER 4/5FR (SHEATH) ×2 IMPLANT
TRANSDUCER W/STOPCOCK (MISCELLANEOUS) ×2 IMPLANT
TUBING CIL FLEX 10 FLL-RA (TUBING) IMPLANT

## 2018-02-26 NOTE — Progress Notes (Signed)
Patient ID: [name], female   DOB: 1940/08/24, 77 y.o.   MRN: [id number]     Advanced Heart Failure Rounding Note  PCP-Cardiologist: No primary care provider on file.   Subjective:    Good diuresis again yesterday, do not think weight is accurate (bed weight).  Breathing has improved.  Creatinine up to 1.48 today.   RHC Procedural Findings (8/4): Hemodynamics (mmHg) RA 8 RV 40/11 PA 45/12, mean 28 PCWP mean 17 Oxygen saturations: PA 58% AO 100% Cardiac Output (Fick) 4.82  Cardiac Index (Fick) 2.46 PVR 2.3 WU  Objective:   Weight Range: 191 lb 9.6 oz (86.9 kg) Body mass index is 30.93 kg/m.   Vital Signs:   Temp:  [98 F (36.7 C)-98.2 F (36.8 C)] 98.1 F (36.7 C) (08/05 0743) Pulse Rate:  [58-68] 65 (08/05 1017) Resp:  [14-31] 31 (08/05 1017) BP: (100-130)/(46-87) 115/54 (08/05 1017) SpO2:  [90 %-100 %] 100 % (08/05 1017) Weight:  [191 lb 9.6 oz (86.9 kg)] 191 lb 9.6 oz (86.9 kg) (08/05 0614) Last BM Date: 02/24/18  Weight change: Filed Weights   02/24/18 0600 02/25/18 0605 02/26/18 0614  Weight: 194 lb 14.4 oz (88.4 kg) 183 lb 1.6 oz (83.1 kg) 191 lb 9.6 oz (86.9 kg)    Intake/Output:   Intake/Output Summary (Last 24 hours) at 02/26/2018 1043 Last data filed at 02/26/2018 0600 Gross per 24 hour  Intake 720 ml  Output 2450 ml  Net -1730 ml      Physical Exam    General: NAD Neck: No JVD, no thyromegaly or thyroid nodule.  Lungs: Clear to auscultation bilaterally with normal respiratory effort. CV: Nondisplaced PMI.  Heart regular S1/S2, no S3/S4, no murmur.  Trace ankle edema.  No carotid bruit.   Abdomen: Soft, nontender, no hepatosplenomegaly, no distention.  Skin: Intact without lesions or rashes.  Neurologic: Alert and oriented x 3.  Psych: Normal affect. Extremities: No clubbing or cyanosis.  HEENT: Normal.    Telemetry   NSR 60s (personally reviewed)  Labs    CBC Recent Labs    02/24/18 0512  WBC 7.2  HGB 9.7*  HCT 33.1*  MCV  101.2*  PLT 158   Basic Metabolic Panel Recent Labs    65/78/46 0408 02/26/18 0428  NA 139 141  K 3.3* 3.6  CL 87* 88*  CO2 38* 40*  GLUCOSE 108* 120*  BUN 31* 43*  CREATININE 1.21* 1.48*  CALCIUM 9.6 9.9   Liver Function Tests No results for input(s): AST, ALT, ALKPHOS, BILITOT, PROT, ALBUMIN in the last 72 hours. No results for input(s): LIPASE, AMYLASE in the last 72 hours. Cardiac Enzymes No results for input(s): CKTOTAL, CKMB, CKMBINDEX, TROPONINI in the last 72 hours.  BNP: BNP (last 3 results) Recent Labs    01/18/18 1341 01/25/18 0749 02/21/18 1717  BNP 254.3* 507.3* 252.7*    ProBNP (last 3 results) No results for input(s): PROBNP in the last 8760 hours.   D-Dimer No results for input(s): DDIMER in the last 72 hours. Hemoglobin A1C No results for input(s): HGBA1C in the last 72 hours. Fasting Lipid Panel No results for input(s): CHOL, HDL, LDLCALC, TRIG, CHOLHDL, LDLDIRECT in the last 72 hours. Thyroid Function Tests No results for input(s): TSH, T4TOTAL, T3FREE, THYROIDAB in the last 72 hours.  Invalid input(s): FREET3  Other results:   Imaging    No results found.   Medications:     Scheduled Medications: . amiodarone  200 mg Oral Daily  .  calcium-vitamin D  2 tablet Oral Q breakfast  . ipratropium  0.5 mg Nebulization QID  . isosorbide mononitrate  90 mg Oral Daily  . metoprolol tartrate  50 mg Oral BID  . pantoprazole  40 mg Oral Daily  . potassium chloride SA  40 mEq Oral BID  . predniSONE  40 mg Oral Q breakfast  . simvastatin  20 mg Oral q1800  . sodium chloride flush  3 mL Intravenous Q12H  . spironolactone  25 mg Oral Daily  . torsemide  80 mg Oral BID    Infusions: . sodium chloride      PRN Medications: sodium chloride, acetaminophen, ALPRAZolam, benzonatate, diclofenac sodium, levalbuterol, ondansetron (ZOFRAN) IV, sodium chloride flush    Assessment/Plan   1. Acute on chronic diastolic CHF: Echo in 7/19 with  EF 60-65%, moderate LVH, rheumatic MV with moderate MS (mean gradient 8, MVA 1.58 cm^2), mild-moderate MR, severe TR, PASP 762 mmHg, D-shaped interventricular septum with mildly dilated RV.  Suspect significant RV failure in setting of moderate mitral stenosis and severe TR. She has been very difficult to manage due to combination of CHF, valvular heart disease and severe COPD.  She was admitted again with dyspnea after missing doses of her diuretic at SNF.  She has diuresed well with Lasix IV + metolazone + acetazolamide, these were stopped yesterday and torsemide 80 mg bid begun. Creatinine up to 1.48 today, filling pressures near normal on today's RHC.   - With rise in creatinine, hold am torsemide today and start on 80 mg bid this evening.  - spironolactone 25 mg daily.  2. CAD: S/P CABG. She has potential source of angina from 90% stenosis in distal LCx. The vessel is small at this point and not amenable to PCI.  - Continue Imdur.  - Continue statin.  - No ASA given Xarelto use.  3. Atrial fibrillation: Paroxysmal.  NSR currently.   - Continue amiodarone.  - Resume Xarelto today.   4. 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  She is not wheezing.  Continue home oxygen.   5. 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  She also has mild to moderate MR.  I suspect that the MR makes her a poor candidate for mitral valvuloplasty, and she also would not tolerate anesthesia well with severe COPD. She also would be a poor surgical candidate due to frailty and severe COPD.  I think our only choice here will be medical management.  6. Lung nodule: Concerning for malignancy, plan for PET as outpatient.   From a CHF standpoint, think she is ready to go home today or tomorrow.  Will be going home.  Will need home health, would like [facility] with diuretic regimen.  Needs close followup 10-14 days CHF clinic. Needs BMET in about a week.  CHF meds for home: torsemide 80 mg bid, Xarelto,  amiodarone 200 daily, spironolactone 25 daily, metoprolol 50 mg bid, Imdur 90 daily, Zocor 20 daily, KCl 40 mEq bid.    Length of Stay: 5  Marca Ancona, MD  02/26/2018, 10:43 AM  Advanced Heart Failure Team Pager [phone] (M-F; 7a - 4p)  Please contact [facility] for night-coverage after hours (4p -7a ) and weekends on [email or web address]

## 2018-02-26 NOTE — Progress Notes (Signed)
Home health orders for [facility] placed with diuretic protocol in place. Case manager consulted to arrange.  HF follow up has been arranged.   Alford Highland, NP

## 2018-02-26 NOTE — Interval H&P Note (Signed)
History and Physical Interval Note:  02/26/2018 9:50 AM  [name]  has presented today for surgery, with the diagnosis of heart failure  The various methods of treatment have been discussed with the patient and family. After consideration of risks, benefits and other options for treatment, the patient has consented to  Procedure(s): RIGHT HEART CATH (N/A) as a surgical intervention .  The patient's history has been reviewed, patient examined, no change in status, stable for surgery.  I have reviewed the patient's chart and labs.  Questions were answered to the patient's satisfaction.     Chaney Ingram [employer]

## 2018-02-26 NOTE — Care Management Important Message (Signed)
Important Message  Patient Details  Name: [name] MRN: [id number] Date of Birth: 07/20/1941   Medicare Important Message Given:  Yes    Yasaman Kolek P Nechemia Chiappetta 02/26/2018, 3:51 PM

## 2018-02-26 NOTE — Evaluation (Signed)
Physical Therapy Evaluation Patient Details Name: [name] MRN: [id number] DOB: 1941-04-28 Today's Date: 02/26/2018   History of Present Illness  77 y.o. female was admitted for SOB and to get heart cath, now is being evaluated to go home.  Pt has been in SNF care with note of poor experience.  Pt has EF 60-65%, moderate LVH, rheumatic MV, mitral regurg, with combined COPD, CHF and mitral  valve disease affecting her presentation.  Admitted to diurese from missing diuretic in SNF.  PMHx:  CHF, OSA, HTN, COPD, 2-3 L home O2, CABG.   Clinical Impression  Pt was seen for establishing needs for home, and noted her inability to stand without mod assist at [facility].  Her plan is to work on LE strength, balance and endurance to increase safety and independence for home.  Her husband cannot care for her as he is a B [place] amputee, and pt is not interested in SNF due to negative previous experiences.  However, she is not going to be able to transfer alone as is.  If family can arrange 24/7 help then she can consider home but currently does not have this level of help.    Follow Up Recommendations SNF    Equipment Recommendations  None recommended by PT    Recommendations for Other Services       Precautions / Restrictions Precautions Precautions: Fall Precaution Comments: watch sats Restrictions Weight Bearing Restrictions: No      Mobility  Bed Mobility Overal bed mobility: Needs Assistance             General bed mobility comments: OOB at entry   Transfers Overall transfer level: Needs assistance Equipment used: Rolling walker (2 wheeled);1 person hand held assist Transfers: Sit to/from Stand Sit to Stand: Mod assist         General transfer comment: mod with cues for hand placement  Ambulation/Gait Ambulation/Gait assistance: Min assist Gait Distance (Feet): 5 Feet Assistive device: Rolling walker (2 wheeled) Gait Pattern/deviations: Step-through pattern;Decreased stride  length Gait velocity: decreased Gait velocity interpretation: <1.31 ft/sec, indicative of household ambulator General Gait Details: unable to take more than 5 steps  Stairs            Wheelchair Mobility    Modified Rankin (Stroke Patients Only)       Balance Overall balance assessment: Needs assistance Sitting-balance support: Feet supported Sitting balance-Leahy Scale: Fair     Standing balance support: Bilateral upper extremity supported;During functional activity Standing balance-Leahy Scale: Poor Standing balance comment: reliant on UE support                             Pertinent Vitals/Pain Pain Assessment: No/denies pain    Home Living Family/patient expects to be discharged to:: Private residence Living Arrangements: Spouse/significant other Available Help at Discharge: Family;Available PRN/intermittently(pt states she has help of son in evening) Type of Home: House Home Access: Ramped entrance     Home Layout: One level Home Equipment: Walker - 4 wheels;Walker - 2 wheels;Hand held shower head;Shower seat;Bedside commode;Wheelchair - manual Additional Comments: Pt's husband is bilat amputee and utilizes power w/c. Son works as a Music therapist. He lives with them and is there at night (and can come during the day if needed.)     Prior Function Level of Independence: Needs assistance   Gait / Transfers Assistance Needed: ambulates short distances with rollator  ADL's / Homemaking Assistance Needed: son does home mgmt  and driving  Comments: therapy home services recently     Hand Dominance   Dominant Hand: Right    Extremity/Trunk Assessment   Upper Extremity Assessment Upper Extremity Assessment: Overall WFL for tasks assessed    Lower Extremity Assessment Lower Extremity Assessment: Generalized weakness    Cervical / Trunk Assessment Cervical / Trunk Assessment: Kyphotic  Communication   Communication: No difficulties  Cognition  Arousal/Alertness: Awake/alert Behavior During Therapy: WFL for tasks assessed/performed Overall Cognitive Status: Within Functional Limits for tasks assessed                                        General Comments      Exercises     Assessment/Plan    PT Assessment Patient needs continued PT services  PT Problem List Decreased strength;Decreased range of motion;Decreased activity tolerance;Decreased balance;Decreased mobility;Decreased coordination;Decreased cognition;Decreased knowledge of use of DME;Decreased safety awareness;Decreased knowledge of precautions;Cardiopulmonary status limiting activity;Obesity       PT Treatment Interventions Gait training;DME instruction;Functional mobility training;Therapeutic activities;Therapeutic exercise;Balance training;Neuromuscular re-education;Patient/family education    PT Goals (Current goals can be found in the Care Plan section)  Acute Rehab PT Goals Patient Stated Goal: get stronger and breathe better PT Goal Formulation: With patient Time For Goal Achievement: 03/12/18 Potential to Achieve Goals: Good    Frequency Min 3X/week   Barriers to discharge Decreased caregiver support husband is not able to assist her    Co-evaluation               AM-PAC PT "6 Clicks" Daily Activity  Outcome Measure Difficulty turning over in bed (including adjusting bedclothes, sheets and blankets)?: A Little Difficulty moving from lying on back to sitting on the side of the bed? : Unable Difficulty sitting down on and standing up from a chair with arms (e.g., wheelchair, bedside commode, etc,.)?: Unable Help needed moving to and from a bed to chair (including a wheelchair)?: A Little Help needed walking in hospital room?: A Little Help needed climbing 3-5 steps with a railing? : A Lot 6 Click Score: 13    End of Session Equipment Utilized During Treatment: Gait belt;Oxygen Activity Tolerance: Patient tolerated  treatment well;Patient limited by fatigue Patient left: in bed;with call bell/phone within reach Nurse Communication: Mobility status PT Visit Diagnosis: Other abnormalities of gait and mobility (R26.89)    Time: 1610-9604 PT Time Calculation (min) (ACUTE ONLY): 29 min   Charges:   PT Evaluation $PT Eval Moderate Complexity: 1 Mod PT Treatments $Therapeutic Activity: 8-22 mins       Ivar Drape 02/26/2018, 6:06 PM   Samul Dada, PT MS Acute Rehab Dept. Number: [facility] [id number] and [facility] [phone]

## 2018-02-26 NOTE — Progress Notes (Signed)
Daily Progress Note   Patient Name: [name]       Date: 02/26/2018 DOB: 1941-01-11  Age: 77 y.o. MRN#: [id number] Attending Physician: Alberteen Sam, * Primary Care Physician: Maurice Small, MD Admit Date: 02/21/2018  Reason for Consultation/Follow-up: Establishing goals of care  Subjective: Patient sitting up in bed. Family at bedside (daugter and husband). She is s/p cardiac cath on today which she tolerated well according to patient and daughter. She denies pain or discomfort. Patient appearance is much better today and she does not appear to be short of breath. She is able to have a discussion with extreme shortness of breath. She verbalizes that she does feel better.   Daughter states they are hopeful that she will be able to get home in the next day or so with home care services. Patient verbalized her agreement and also confirmed that she would still like for outpatient palliative to follow her after discharge. I briefly educated husband and daughter on Palliative services as they were not present during our initial goals of care discussion. Both verbalized understanding and their awareness of the plan as patient and her son Casimiro Needle had reviewed with them. Patient and family verbalized understanding that per Heart Failure's recommendations that she will require close follow up in their clinic at discharge as well. I also re-attempted to discuss patients's living will and code status. Daughter states they have been in conversation and at this time they were not prepared to make a decision regarding DNR/DNI.    Length of Stay: 5  Current Medications: Scheduled Meds:  . amiodarone  200 mg Oral Daily  . calcium-vitamin D  2 tablet Oral Q breakfast  . ipratropium  0.5 mg  Nebulization QID  . isosorbide mononitrate  90 mg Oral Daily  . metoprolol tartrate  50 mg Oral BID  . pantoprazole  40 mg Oral Daily  . potassium chloride SA  40 mEq Oral BID  . predniSONE  40 mg Oral Q breakfast  . rivaroxaban  15 mg Oral Q supper  . simvastatin  20 mg Oral q1800  . sodium chloride flush  3 mL Intravenous Q12H  . spironolactone  25 mg Oral Daily  . torsemide  80 mg Oral BID    Continuous Infusions: . sodium chloride  PRN Meds: sodium chloride, acetaminophen, ALPRAZolam, benzonatate, diclofenac sodium, levalbuterol, ondansetron (ZOFRAN) IV, sodium chloride flush  Physical Exam  Constitutional: She is oriented to person, place, and time. Vital signs are normal. She appears well-developed. She is cooperative.  Cardiovascular: Normal rate, regular rhythm, normal heart sounds and normal pulses.  Pulmonary/Chest: Effort normal. She has decreased breath sounds.  3L/[place]   Abdominal: Soft. Normal appearance and bowel sounds are normal.  Neurological: She is alert and oriented to person, place, and time.  Psychiatric: Judgment normal. Cognition and memory are normal.  Nursing note and vitals reviewed.           Vital Signs: BP (!) 107/46 (BP Location: Left Arm)   Pulse 66   Temp 97.9 F (36.6 C) (Oral)   Resp (!) 25   Ht 5\' 6"  (1.676 m)   Wt 86.9 kg (191 lb 9.6 oz)   SpO2 100%   BMI 30.93 kg/m  SpO2: SpO2: 100 % O2 Device: O2 Device: Nasal Cannula O2 Flow Rate: O2 Flow Rate (L/min): 2.5 L/min  Intake/output summary:   Intake/Output Summary (Last 24 hours) at 02/26/2018 1210 Last data filed at 02/26/2018 0600 Gross per 24 hour  Intake 480 ml  Output 1800 ml  Net -1320 ml   LBM: Last BM Date: 02/24/18 Baseline Weight: Weight: 92.1 kg (203 lb) Most recent weight: Weight: 86.9 kg (191 lb 9.6 oz)       Palliative Assessment/Data: PPS 30 %   Flowsheet Rows     Most Recent Value  Intake Tab  Referral Department  Hospitalist  Unit at Time of Referral   Cardiac/Telemetry Unit  Palliative Care Primary Diagnosis  Cardiac  Date Notified  02/24/18  Palliative Care Type  New Palliative care  Reason for referral  Clarify Goals of Care  Date of Admission  02/21/18  Date first seen by Palliative Care  02/24/18  # of days Palliative referral response time  0 Day(s)  # of days IP prior to Palliative referral  3  Clinical Assessment  Psychosocial & Spiritual Assessment  Palliative Care Outcomes      Patient Active Problem List   Diagnosis Date Noted  . Normocytic anemia 01/25/2018  . Metabolic alkalosis 12/13/2017  . Gait disturbance 11/21/2017  . Lung nodule 11/07/2016  . Acute on chronic diastolic CHF (congestive heart failure) (HCC)   . Mitral valve stenosis   . Rheumatic aortic valve disease   . Chronic respiratory failure with hypoxia (HCC) 04/20/2016  . Rheumatic mitral valve disease   . PFO (patent foramen ovale)   . Cardiomyopathy (HCC)   . Chronic atrial fibrillation (HCC)   . Alcoholic cardiomyopathy (HCC)   . Aortic valve vegetation 09/24/2015  . Lumbar spine scoliosis 04/18/2015  . Mitral stenosis 02/10/2015  . Chronic diastolic heart failure (HCC) 05/10/2013  . Chronic anticoagulation 05/10/2013  . HTN (hypertension) 05/10/2013  . Dyslipidemia, goal LDL below 70 05/10/2013  . Seasonal allergic rhinitis 04/11/2012  . DYSPNEA 03/14/2008  . Coronary atherosclerosis 02/27/2008  . COPD (chronic obstructive pulmonary disease) (HCC) 02/27/2008  . Pulmonary hypertension (HCC) 02/21/2008    Palliative Care Assessment & Plan   Patient Profile: 77 y.o. female admitted on 02/21/2018 from [facility] with complaints of increased shortness of breath, increased bilateral lower extremity edema, and low hemoglobin. She has a past medical history significant for COPD with chronic hypoxic respiratory failure (home oxygen 2L/[place]), chronic diastolic CHF, CAD s/p CABG, normocytic anemia, CKD stage 3, atrial fibrillation (Xarelto), anxiety,  osetoarthritis, hypertension, hyperlipidemia, and GERD. Patient was recently admitted in July for acute on chronic diastilic CHF. She received a blood transfusion and found to have a concerning lung nodule. Patient reported while at SNF worsening dyspnea and bilateral leg edema for 1-2 weeks prior to admission. Per admission reports SNF medical team reported patient's antihypertensives and diuretics were held due to hypotension on several occassions. Pulmonology remains concerned about lung nodule and the possibility of adenocarcinoma. During her ED course she was afebrile, maintaining saturations on 2L/[place]. Creatinine 1.35, hemoglobin 9.8, occult blood negative. BNP 252.7. Since admission she has been followed by the Heart Failure team. Palliative Medicine team consulted for goals of care.   Recommendations/Plan:  Full Code-as confirmed by patient and family  Continue to treat the treatable while hospitalized. Patient is hopeful to discharge home tomorrow with home care in place based on Heart Failure's recommendations.   Patient and family would like outpatient Palliative to follow.   Case Manager referral for outpatient Palliative.   Palliative medicine team will continue to support patient, patient's family, and medical team during hospitalization.  Goals of Care and Additional Recommendations:  Limitations on Scope of Treatment: Full Scope Treatment  Code Status:    Code Status Orders  (From admission, onward)        Start     Ordered   02/21/18 2215  Full code  Continuous     02/21/18 2214    Code Status History    Date Active Date Inactive Code Status Order ID Comments User Context   02/21/2018 2143 02/21/2018 2214 Partial Code [id number]  Briscoe Deutscher, MD Inpatient   02/21/2018 2116 02/21/2018 2143 DNR [id number]  Briscoe Deutscher, MD ED   01/25/2018 1103 02/01/2018 1656 DNR [id number]  Jonah Blue, MD ED   12/13/2017 1701 12/26/2017 2033 Partial Code [id number]  Gwenyth Bender, NP  ED   12/13/2017 1623 12/13/2017 1700 Full Code [id number]  Gwenyth Bender, NP ED   11/21/2017 1309 12/13/2017 1135 Full Code [id number]  Margaretha Sheffield, NP Outpatient   06/11/2016 2140 06/18/2016 1928 Full Code [id number]  Filbert Schilder, MD Inpatient   02/08/2016 1004 02/08/2016 1722 Full Code [id number]  Laurey Morale, MD Inpatient   10/03/2015 2113 10/05/2015 2014 Full Code [id number]  Ron Parker, MD Inpatient   10/03/2015 2106 10/03/2015 2112 DNR [id number]  Ron Parker, MD Inpatient   09/24/2015 1540 09/30/2015 1433 DNR [id number]  Graciella Freer, PA-C Inpatient   11/02/2011 1418 11/06/2011 1624 DNR [id number]  Zannie Cove, MD ED    Advance Directive Documentation     Most Recent Value  Type of Advance Directive  Healthcare Power of Attorney, Living will  Pre-existing out of facility DNR order (yellow form or pink MOST form)  -  "MOST" Form in Place?  -       Prognosis:  Unable to determine-Guarded to poor-in the setting of acute on chronic diastolic CHF, regular heart disease, severe COPD with chronic hypoxic respiratory failure, home oxygen use 2 L per nasal cannula, CAD status post CABG, atrial fibrillation, on iron deficiency anemia, decreased p.o. intake, decreased immobility, generalized weakness, shortness of breath, and newly found suspicious pulmonary nodule.  Discharge Planning:  Home with Home Health and outpatient Palliative Services.   Care plan was discussed with patient and patient's family.   Thank you for allowing the Palliative Medicine Team to assist in the care of this patient.   Total Time 25 min.  Prolonged Time  Billed  NO        Greater than 50%  of this time was spent counseling and coordinating care related to the above assessment and plan.  Willette Alma, NP  Please contact Palliative Medicine Team phone at [phone] for questions and concerns.

## 2018-02-26 NOTE — Progress Notes (Signed)
PROGRESS NOTE    MYKENNA SERVICE  YPP:[id number] DOB: [birth year] DOA: 02/21/2018 PCP: Maurice Small, MD      Brief Narrative:  Mrs. Eilers is a 77 y.o. 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   Had recently been admitted to the hospital for dyspnea.  Treated with IV steroids and nebs as well as IV Lasix, improved and was discharged (although she reports leg swelling *worsened* in hospital).  For about a week at [facility] she was doing well, then last 7 days, started to have worsening dyspnea, weight gain.      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  Diuresed now 10L.  EF 65% earlier this month.  -2.7 L again yesterday, even after transitioning to home torsemide.  Acetazolamide given over the weekend and bicarb down.  Statin continues to trend up today.  Potassium normalized.   Pressure okay -Defer diuretic dosing to cardiology, Lasix, metolazone, and acetazolamide was stopped today, home torsemide was restarted  -Continue metoprolol, spironolactone -Continue potassium -Strict I/Os, daily weights, telemetry  -Daily monitoring renal function -Consult to CHF team, appreciate cares   Acute on chronic hypoxic respiratory failure Untreated sleep apnea Pulmonary hypertension  After hypercapnic respiratory failure on 8/2-3, patient was BiPAP'd briefly, improved.  States she was tested for sleep apnea years ago, had it.  Tested again 2 years ago, didn't.  Now however, here, she desaturates with sleep.    -Strictly limit SPO2 88 to 92% -Recommend CPAP at night as tolerated  Coronary artery disease secondary prevention Hypertension BP remains normal -Continue Imdur, metoprolol, statin  Paroxysmal atrial  fibrillation CHA2DS2-Vasc 6, on Xarelto.  Sinus rhythm with PACs on monitor. -Continue amiodarone, metoprolol, Xarelto  Severe COPD, now with exacerbation Had hypercapnic respiratory failure on 8/2, afterwards, wheezy.  Hx of being intolerant to LAMAs, LABAs, and ICSs.  No wheezing again today. -Continue, day 3 of 5 -Continue Atrovent 4 times daily  Pulmonary hypertension  Normocytic anemia, chronic iron deficiency There was a report that her Hgb had dropped, this was incorrect, it is stable relative to her baseline ~10 g/dL.  Hemoglobin no change.  Hypokalemia -Continue potassium daily with Lasix  Other medications -Continue PPI       DVT prophylaxis: N/A on Xarelto Code Status: FULL Family Communication: Son and both daughters at the bedside MDM and disposition Plan: The below labs and imaging studies were personally reviewed and summarized above.  Medication management as above.  Cardiology are directing diuresis, plan for RHC today.  Palliative care evaluated on Saturday, clarified that patient would like to be full code until she has had this discussion with family.  I had a long discussion with family today about her downward trajectory in the last few months, and although hopefully with this aggressive diuresis, and right heart cath to direct diuretic therapy at discharge, it is entirely possible we are witnessing a progression of her cardiopulmonary failure that is worsening and represents possibly terminal illness.  I encouraged him to be open to palliative care/hospice.        Consultants:   Cardiology  Procedures:   None  Antimicrobials:   None    Subjective: Wheezing still resolved, no longer tachypneic or  dyspneic, no chest pain, leg swelling, shortness of breath.  Objective: Vitals:   02/25/18 2347 02/26/18 0446 02/26/18 0614 02/26/18 0743  BP: (!) 109/49 (!) 122/49  (!) 123/48  Pulse: 65 (!) 58  (!) 58  Resp: (!) 25 19  (!) 23  Temp: 98.1  F (36.7 C) 98 F (36.7 C)  98.1 F (36.7 C)  TempSrc: Oral Oral  Oral  SpO2: 93% 96%  99%  Weight:   86.9 kg (191 lb 9.6 oz)   Height:        Intake/Output Summary (Last 24 hours) at 02/26/2018 0908 Last data filed at 02/26/2018 0600 Gross per 24 hour  Intake 723 ml  Output 3250 ml  Net -2527 ml   Filed Weights   02/24/18 0600 02/25/18 0605 02/26/18 0614  Weight: 88.4 kg (194 lb 14.4 oz) 83.1 kg (183 lb 1.6 oz) 86.9 kg (191 lb 9.6 oz)    Examination: General appearance: Obese adult female, sitting in bed, interactive, no acute distress HEENT: Anicteric, conjunctival pink, lids and lashes normal, no nasal deformity, discharge, or epistaxis.  Lips moist, dentures in place.  Oropharynx normal, no oral lesions, hearing normal. Skin:, No suspicious rashes or lesions, skin warm and dry Cardiac: Regular rate and rhythm, soft systolic murmur, trace pitting edema Respiratory: Respiratory rate normal, no wheezing or rales. Abdomen: Abdomen is soft without tenderness to palpation, guarding, or hepatosplenomegaly. MSK: No deformities or effusions Neuro: Awake and alert, cranial nerves grossly normal, tired, extraocular movements intact, moves upper extremities with 5-/5 strength, symmetric, speech fluent. Psych: Awake and alert, oriented to person, place, and time.  Judgment and insight appear normal    Data Reviewed: I have personally reviewed following labs and imaging studies:  CBC: Recent Labs  Lab 02/21/18 1700 02/22/18 0323 02/23/18 0216 02/24/18 0512  WBC 6.7 7.5 8.0 7.2  NEUTROABS 4.6  --   --   --   HGB 9.8* 10.4* 9.8* 9.7*  HCT 32.3* 35.1* 33.0* 33.1*  MCV 98.5 99.4 101.2* 101.2*  PLT 176 170 163 158   Basic Metabolic Panel: Recent Labs  Lab 02/22/18 0323 02/23/18 0216 02/24/18 0512 02/25/18 0408 02/26/18 0428  NA 144 144 145 139 141  K 3.2* 3.4* 4.2 3.3* 3.6  CL 97* 96* 92* 87* 88*  CO2 36* 37* 43* 38* 40*  GLUCOSE 124* 121* 107* 108* 120*  BUN 22 25* 23  31* 43*  CREATININE 1.14* 1.34* 0.99 1.21* 1.48*  CALCIUM 9.1 9.0 9.6 9.6 9.9  MG 2.5*  --   --   --   --    GFR: Estimated Creatinine Clearance: 34.8 mL/min (A) (by C-G formula based on SCr of 1.48 mg/dL (H)). Liver Function Tests: Recent Labs  Lab 02/21/18 1700  AST 16  ALT 18  ALKPHOS 66  BILITOT 0.8  PROT 6.1*  ALBUMIN 3.3*   No results for input(s): LIPASE, AMYLASE in the last 168 hours. No results for input(s): AMMONIA in the last 168 hours. Coagulation Profile: No results for input(s): INR, PROTIME in the last 168 hours. Cardiac Enzymes: Recent Labs  Lab 02/21/18 1700  TROPONINI <0.03   BNP (last 3 results) No results for input(s): PROBNP in the last 8760 hours. HbA1C: No results for input(s): HGBA1C in the last 72 hours. CBG: No results for input(s): GLUCAP in the last 168 hours. Lipid Profile: No results for input(s): CHOL, HDL, LDLCALC, TRIG, CHOLHDL, LDLDIRECT in the last 72 hours. Thyroid Function Tests: No results for input(s): TSH,  T4TOTAL, FREET4, T3FREE, THYROIDAB in the last 72 hours. Anemia Panel: No results for input(s): VITAMINB12, FOLATE, FERRITIN, TIBC, IRON, RETICCTPCT in the last 72 hours. Urine analysis:    Component Value Date/Time   COLORURINE STRAW (A) 02/21/2018 2038   APPEARANCEUR CLEAR 02/21/2018 2038   LABSPEC 1.006 02/21/2018 2038   PHURINE 6.0 02/21/2018 2038   GLUCOSEU NEGATIVE 02/21/2018 2038   HGBUR NEGATIVE 02/21/2018 2038   BILIRUBINUR NEGATIVE 02/21/2018 2038   KETONESUR NEGATIVE 02/21/2018 2038   PROTEINUR NEGATIVE 02/21/2018 2038   NITRITE NEGATIVE 02/21/2018 2038   LEUKOCYTESUR NEGATIVE 02/21/2018 2038   Sepsis Labs: @LABRCNTIP (procalcitonin:4,lacticacidven:4)  ) Recent Results (from the past 240 hour(s))  MRSA PCR Screening     Status: None   Collection Time: 02/21/18  9:38 PM  Result Value Ref Range Status   MRSA by PCR NEGATIVE NEGATIVE Final    Comment:        The GeneXpert MRSA Assay (FDA approved for  NASAL specimens only), is one component of a comprehensive MRSA colonization surveillance program. It is not intended to diagnose MRSA infection nor to guide or monitor treatment for MRSA infections. Performed at [facility], [address] [place], [place] [address]          Radiology Studies: No results found.      Scheduled Meds: . amiodarone  200 mg Oral Daily  . calcium-vitamin D  2 tablet Oral Q breakfast  . ipratropium  0.5 mg Nebulization QID  . isosorbide mononitrate  90 mg Oral Daily  . metoprolol tartrate  50 mg Oral BID  . pantoprazole  40 mg Oral Daily  . potassium chloride SA  40 mEq Oral BID  . predniSONE  40 mg Oral Q breakfast  . simvastatin  20 mg Oral q1800  . sodium chloride flush  3 mL Intravenous Q12H  . spironolactone  25 mg Oral Daily  . torsemide  80 mg Oral BID   Continuous Infusions: . sodium chloride    . sodium chloride 10 mL/hr at 02/26/18 0047     LOS: 5 days    Time spent: 25 minutes    Alberteen Sam, MD [facility] 02/26/2018, 9:08 AM     Pager [phone] --- please page though AMION:  [email or web address] If 7PM-7AM, please contact night-coverage

## 2018-02-26 NOTE — Progress Notes (Signed)
PT Cancellation Note  Patient Details Name: [name] MRN: [id number] DOB: 12-Apr-1941   Cancelled Treatment:    Reason Eval/Treat Not Completed: Patient at procedure or test/unavailable.  Pt is heading to cath lab and will try again at another time.   Ivar Drape 02/26/2018, 9:13 AM   Samul Dada, PT MS Acute Rehab Dept. Number: [facility] [id number] and [facility] [phone]

## 2018-02-26 NOTE — Discharge Instructions (Signed)

## 2018-02-26 NOTE — Progress Notes (Signed)
Orthopedic Tech Progress Note Patient Details:  [name] 05/01/1941 [id number]  Patient ID: [name], female   DOB: 10-31-1940, 77 y.o.   MRN: [id number]   Saul Fordyce 02/26/2018, 7:28 AMOut of unna boot wrap.

## 2018-02-27 DIAGNOSIS — R911 Solitary pulmonary nodule: Secondary | ICD-10-CM

## 2018-02-27 LAB — BASIC METABOLIC PANEL
ANION GAP: 11 (ref 5–15)
BUN: 53 mg/dL — ABNORMAL HIGH (ref 8–23)
CALCIUM: 9.5 mg/dL (ref 8.9–10.3)
CO2: 41 mmol/L — ABNORMAL HIGH (ref 22–32)
Chloride: 91 mmol/L — ABNORMAL LOW (ref 98–111)
Creatinine, Ser: 1.53 mg/dL — ABNORMAL HIGH (ref 0.44–1.00)
GFR, EST AFRICAN AMERICAN: 36 mL/min — AB (ref >60)
GFR, EST NON AFRICAN AMERICAN: 31 mL/min — AB (ref >60)
GLUCOSE: 105 mg/dL — AB (ref 70–99)
Potassium: 3.3 mmol/L — ABNORMAL LOW (ref 3.5–5.1)
SODIUM: 143 mmol/L (ref 135–145)

## 2018-02-27 MED ORDER — PREDNISONE 20 MG PO TABS: 40.0000 mg | ORAL_TABLET | Freq: Every day | ORAL | 0 refills | Status: DC

## 2018-02-27 MED ORDER — TORSEMIDE 20 MG PO TABS: 80.0000 mg | ORAL_TABLET | Freq: Two times a day (BID) | ORAL | Status: DC

## 2018-02-27 NOTE — Progress Notes (Signed)
Discharge instruction was given to pt and son.  Hinton Dyer, RN

## 2018-02-27 NOTE — Discharge Summary (Signed)
Physician Discharge Summary  [name] WUJ:[id number] DOB: 07-22-1941 DOA: 02/21/2018  PCP: Maurice Small, MD  Admit date: 02/21/2018 Discharge date: 02/27/2018  Admitted From: [name] Disposition:  Home   Recommendations for Outpatient Follow-up:  1. Follow up with Dr. Shirlee Latch in 1 week 2. Please obtain BMP in one week   Home Health: Yes  Equipment/Devices: TBD  Discharge Condition: Fair  CODE STATUS: FULL Diet recommendation: Cardiac, low sodium  Brief/Interim Summary: [name] is a 77 y.o. 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   Had recently been admitted to the hospital for dyspnea.  Treated with IV steroids and nebs as well as IV Lasix, improved and was discharged (although she reports leg swelling *worsened* in hospital).  For about a week at [facility] she was doing well, then last 7 days, started to have worsening dyspnea, weight gain.       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 Evaluated by CHF team.  Diuresed 10L.  EF 65% earlier this month.  Required acetazolamide for elevated bicarb. Transitioned to oral Torsemide before discharge and underwent RHC that showed slightly elevated right heart pressures, CI 2.4.     Palliative care were consulted regarding her comorbid CHF, recurrent admissions, severe, oxygen-dependent COPD/respiratory failure.      Acute on chronic hypoxic respiratory failure Untreated sleep apnea Pulmonary hypertension  Patient did develop hypercapnic respiratory failure on 8/2, was on BiPAP for part of one day, improved.   During this hospitalization, she desaturated with sleep.       Coronary artery disease secondary prevention Hypertension  Paroxysmal  atrial fibrillation CHA2DS2-Vasc 6, on Xarelto.  Sinus rhythm with PACs on monitor.  Continued on amiodarone, metoprolol, Xarelto  Severe COPD, now with exacerbation Had hypercapnic respiratory failure on 8/2, afterwards, wheezy.  Hx of being intolerant to LAMAs, LABAs, and ICSs. Wheezing improved with steroids.  Normocytic anemia, chronic iron deficiency There was a report that her Hgb had dropped, this was incorrect, it is stable relative to her baseline ~10 g/dL.  Hemoglobin no change.  Hypokalemia         Discharge Instructions  Discharge Instructions    Diet - low sodium heart healthy   Complete by:  As directed    Discharge instructions   Complete by:  As directed    From Dr. Maryfrances Bunnell: You were admitted with swelling and feeling out of breath.   This was, as you were aware, from fluid overload (in other words, congestive heart failure). You were treated with IV Lasix and on your right heart cath, Dr. Shirlee Latch found that the pressures were as close to normal as we can get them.  Restart the following medicines: Torsemide 80 mg twice daily (hold your evening dose tonight, take tomorrow) Xarelto 15 mg daily Amiodarone 200 mg daily Imdur 90 mg daily Spironolactone 25 mg daily Metoprolol 50 mg twice daily Potassium 40 units twice daily Simvastatin/Zocor 20 mg daily    You were also wheezing when you were here, so take two more days of prednisone 40 mg in the morning.  Follow up closely with Dr. Shirlee Latch as you were instructed.   Increase activity slowly   Complete by:  As directed  Allergies as of 02/27/2018      Reactions   Lisinopril Cough   Developed persistent dry cough for a month.    Tape Other (See Comments)   Bleeding   Cefpodoxime Other (See Comments)   Yeast infections    Codeine Other (See Comments)   Anxious/ jittery   Lipitor [atorvastatin] Other (See Comments)   Made joint start hurting/ Muscle aches   Other Swelling   Venholin HFA    Penicillins Other (See Comments)   childhood reaction Has patient had a PCN reaction causing immediate rash, facial/tongue/throat swelling, SOB or lightheadedness with hypotension: unknown Has patient had a PCN reaction causing severe rash involving mucus membranes or skin necrosis: No Has patient had a PCN reaction that required hospitalization No Has patient had a PCN reaction occurring within the last 10 years: No If all of the above answers are "NO", then may proceed with Cephalosporin use.   Ventolin [kdc:albuterol] Other (See Comments)   "jittery"   Xopenex [levalbuterol] Other (See Comments)   Made mouth and throat sore   Anoro Ellipta [umeclidinium-vilanterol] Itching   Clindamycin Other (See Comments)   Unknown    Hydrocodone-acetaminophen Anxiety   Latex Itching, Rash      Medication List    STOP taking these medications   amLODipine 2.5 MG tablet Commonly known as:  NORVASC     TAKE these medications   acetaminophen 500 MG tablet Commonly known as:  TYLENOL Take 1,000 mg by mouth 2 (two) times daily as needed for headache.   amiodarone 200 MG tablet Commonly known as:  PACERONE Take 1 tablet (200 mg total) by mouth daily.   benzonatate 200 MG capsule Commonly known as:  TESSALON Take 1 capsule (200 mg total) by mouth 3 (three) times daily as needed for cough.   calcium-vitamin D 500-200 MG-UNIT tablet Commonly known as:  OSCAL WITH D Take 2 tablets by mouth daily.   clonazePAM 0.5 MG tablet Commonly known as:  KLONOPIN Take 1 tablet (0.5 mg total) by mouth at bedtime.   diclofenac sodium 1 % Gel Commonly known as:  VOLTAREN Apply 2 g topically daily as needed (pain).   docusate sodium 100 MG capsule Commonly known as:  COLACE Take 200 mg by mouth as needed for mild constipation.   ferrous gluconate 324 MG tablet Commonly known as:  FERGON Take 324 mg by mouth daily.   ipratropium 0.02 % nebulizer solution Commonly known as:  ATROVENT Take 2.5 mLs  (0.5 mg total) by nebulization 4 (four) times daily. Dx: J44.9   isosorbide mononitrate 30 MG 24 hr tablet Commonly known as:  IMDUR Take 90 mg by mouth daily. What changed:  Another medication with the same name was removed. Continue taking this medication, and follow the directions you see here.   loratadine 10 MG tablet Commonly known as:  CLARITIN Take 10 mg by mouth daily.   metoprolol tartrate 50 MG tablet Commonly known as:  LOPRESSOR Take 1 tablet (50 mg total) by mouth 2 (two) times daily.   NITROSTAT 0.4 MG SL tablet Generic drug:  nitroGLYCERIN place 1 tablet under the tongue if needed every 5 minutes for chest pain for 3 doses IF NO RELIEF AFTER 3RD DOSE CALL PRESCRIBER OR 911.   omeprazole 40 MG capsule Commonly known as:  PRILOSEC Take 40 mg by mouth daily.   OXYGEN Inhale 2-3 L into the lungs continuous. At rest 2 liters  Moving around 3 liters   potassium chloride SA  20 MEQ tablet Commonly known as:  K-DUR,KLOR-CON TAKE 2 TABLETS BY MOUTH TWO TIMES DAILY What changed:  See the new instructions.   predniSONE 20 MG tablet Commonly known as:  DELTASONE Take 2 tablets (40 mg total) by mouth daily with breakfast. Start taking on:  02/28/2018   Rivaroxaban 15 MG Tabs tablet Commonly known as:  XARELTO Take 1 tablet (15 mg total) by mouth daily with supper.   simvastatin 20 MG tablet Commonly known as:  ZOCOR TAKE 3 TABLETS BY MOUTH  DAILY AT 6 PM   spironolactone 25 MG tablet Commonly known as:  ALDACTONE TAKE 1 TABLET BY MOUTH  DAILY What changed:    how much to take  how to take this  when to take this   torsemide 20 MG tablet Commonly known as:  DEMADEX Take 4 tablets (80 mg total) by mouth 2 (two) times daily. What changed:    how much to take  when to take this  additional instructions      Follow-up Information    [facility] Follow up on 03/08/2018.   Specialty:  Cardiology Why:  Heart  Failure Followup at [facility]  11:30 am.  Take all morning medication before your appointment.  Bring all medication bottles with you to appointment.  Give 24 hr notice for cancellation/rescheduling. Garage Code: 1500 Contact information: [address] [id number] mc [place] [address] [phone]       [facility] Follow up.   Specialty:  Home Health Services Why:  Home Health RN, Physical Therapy, aide and Social Worker- agency will call to arrange initial visit Contact information: [address] [place] [address] [phone]        [facility] Follow up.   Why:  Follow up for Outpatient Palliative Services- agency will call to arrange appointment Contact information: [address] [facility] [place] [address] [phone]         Allergies  Allergen Reactions  . Lisinopril Cough    Developed persistent dry cough for a month.   . Tape Other (See Comments)    Bleeding  . Cefpodoxime Other (See Comments)    Yeast infections   . Codeine Other (See Comments)    Anxious/ jittery  . Lipitor [Atorvastatin] Other (See Comments)    Made joint start hurting/ Muscle aches  . Other Swelling    Venholin HFA  . Penicillins Other (See Comments)    childhood reaction Has patient had a PCN reaction causing immediate rash, facial/tongue/throat swelling, SOB or lightheadedness with hypotension: unknown Has patient had a PCN reaction causing severe rash involving mucus membranes or skin necrosis: No Has patient had a PCN reaction that required hospitalization No Has patient had a PCN reaction occurring within the last 10 years: No If all of the above answers are "NO", then may proceed with Cephalosporin use.   Terald Sleeper [Kdc:Albuterol] Other (See Comments)    "jittery"  . Xopenex [Levalbuterol] Other (See Comments)    Made mouth and throat sore  . Anoro Ellipta [Umeclidinium-Vilanterol]  Itching  . Clindamycin Other (See Comments)    Unknown   . Hydrocodone-Acetaminophen Anxiety  . Latex Itching and Rash    Consultations:  Cardiology, CHF team   Procedures/Studies: Dg Chest 2 View  Result Date: 02/21/2018 CLINICAL DATA:  Increasing shortness of breath EXAM: CHEST - 2 VIEW COMPARISON:  Plain film from 01/25/2018, chest CT from 01/30/2018 FINDINGS:  Cardiac shadow is enlarged. Left pleural effusion with mild left basilar atelectasis is seen slightly increased when compare with the prior exam. The right lung is clear. Postsurgical changes are noted. No acute bony abnormality is seen. IMPRESSION: Increase in left basilar atelectasis and effusion. Electronically Signed   By: [name] M.D.   On: 02/21/2018 18:38   Ct Chest W Contrast  Result Date: 01/30/2018 CLINICAL DATA:  77 year old with recent progressive shortness of breath associated with BILATERAL LOWER extremity edema and weight gain. Current history of pulmonary hypertension, COPD with chronic hypoxic respiratory failure, hypertension, diastolic heart failure and coronary artery disease with prior CABG. EXAM: CT CHEST WITH CONTRAST TECHNIQUE: Multidetector CT imaging of the chest was performed during intravenous contrast administration. CONTRAST:  OMNIPAQUE IOHEXOL 300 MG/ML IV. COMPARISON:  CT chest 11/01/2017, 01/23/2017. Chest x-rays 01/25/2018 and earlier. FINDINGS: Cardiovascular: Heart markedly enlarged with biatrial enlargement in particular. Prior CABG. The LAD, RIGHT coronary and LIMA grafts appear patent, though there is dystrophic calcification involving the proximal LAD graft. No pericardial effusion. Severe atherosclerosis involving the thoracic and proximal abdominal aorta without evidence of aneurysm, maximum ascending thoracic aortic diameter 3.7 cm. Atherosclerosis involving the proximal great vessels with borderline stenosis of the origin of the LEFT common carotid artery. Central pulmonary arteries  patent. Calcification involving the wall of the LEFT lower lobe pulmonary artery is again noted. Mediastinum/Nodes: Numerous normal-sized lymph nodes throughout the mediastinum and in both hila. No pathologically enlarged mediastinal, hilar or axillary lymph nodes. No mediastinal masses. Normal-appearing esophagus. Prior RIGHT hemithyroidectomy. Multiple small nodules involving the residual LEFT lobe, unchanged, without evidence of a dominant nodule. Lungs/Pleura: The previously identified ground-glass nodule involving the LEFT lung apex currently has a more solid appearance currently, measuring approximately 1.1 x 1.0 cm, not significantly changed in size since the 01/23/2017 CT where it measured approximately 1.0 x 0.9 cm. The pleural based 4 mm nodule in the POSTERIOR RIGHT apex is unchanged. Diffuse interstitial pulmonary edema is present. Linear atelectasis is present in the RIGHT lower lobe. Atelectasis superimposed upon scarring is present in the LEFT lower lobe. No pleural effusions. Central airways patent without significant bronchial wall thickening. Upper Abdomen: Enlargement of the LEFT lobe of the liver relative to the RIGHT lobe, with associated enlargement of the caudate lobe. No focal parenchymal abnormality within the visualized liver. Visualized upper abdomen otherwise unremarkable. Musculoskeletal: Degenerative disc disease and spondylosis throughout the thoracic spine. Mild compression fracture of the UPPER endplate of T12 and the LOWER endplate of T5 which are unchanged. No acute osseous findings. IMPRESSION: 1. Mild CHF, with evidence of mild diffuse interstitial pulmonary edema. 2. BILATERAL lower lobe atelectasis. 3. LEFT apical lung nodule, previously ground-glass, now has a more solid appearance, though is not significantly changed in size since the CT in July, 2018. Adenocarcinoma is considered. Thoracic surgery consultation is recommended. PET/CT should be considered for staging purposes.  These recommendations are taken from: Recommendations for the Management of Subsolid Pulmonary Nodules Detected at CT: A Statement from the Fleischner Society Radiology 2013; 266:1, 304-317. 4. Calcification involving the wall of the LEFT lower lobe pulmonary artery indicates pulmonary arterial hypertension. 5. Hepatic cirrhosis is suspected, as there is relative enlargement of the LEFT lobe compared to the RIGHT lobe. Aortic Atherosclerosis (ICD10-I70.0). Electronically Signed   By: Hulan Saas M.D.   On: 01/30/2018 17:11   Dg Chest Port 1 View  Result Date: 02/23/2018 CLINICAL DATA:  Dyspnea. EXAM: PORTABLE CHEST 1 VIEW COMPARISON:  02/21/2018  FINDINGS: The patient is rotated to the left. Sequelae of prior CABG are again identified. The cardiac silhouette remains enlarged. Pulmonary vascular congestion appears slightly increased with suspected mild interstitial edema. There is persistent elevation of the left hemidiaphragm with blunting of the left lateral costophrenic angle. No large pleural effusion is evident. Bibasilar atelectasis has increased on the right. No pneumothorax is identified. IMPRESSION: 1. Cardiomegaly with increased pulmonary vascular congestion and mild interstitial edema. 2. Bibasilar atelectasis, increased on the right. Electronically Signed   By: Sebastian Ache M.D.   On: 02/23/2018 09:29       Subjective: Feeling tired, weak.  No dyspnea relative to baseline now.  No swelling of legs.  No chest pain, palpitations.  Discharge Exam: Vitals:   02/27/18 0738 02/27/18 0900  BP: (!) 104/45   Pulse: 61   Resp: 17 (!) 26  Temp: (!) 97.5 F (36.4 C)   SpO2: 98% 100%   Vitals:   02/27/18 0500 02/27/18 0503 02/27/18 0738 02/27/18 0900  BP:  116/63 (!) 104/45   Pulse:  89 61   Resp:  20 17 (!) 26  Temp:  98.4 F (36.9 C) (!) 97.5 F (36.4 C)   TempSrc:  Oral Oral   SpO2:  95% 98% 100%  Weight: 86.3 kg (190 lb 3.2 oz)     Height:        General: Pt is alert, awake,  not in acute distress, lying in bed Cardiovascular: RRR, S1/S2 +, systolic murmur noted.  Minimal LE edema Respiratory: CTA bilaterally, no wheezing, no rhonchi Abdominal: Soft, NT, ND, bowel sounds + Extremities: no edema, no cyanosis    The results of significant diagnostics from this hospitalization (including imaging, microbiology, ancillary and laboratory) are listed below for reference.     Microbiology: Recent Results (from the past 240 hour(s))  MRSA PCR Screening     Status: None   Collection Time: 02/21/18  9:38 PM  Result Value Ref Range Status   MRSA by PCR NEGATIVE NEGATIVE Final    Comment:        The GeneXpert MRSA Assay (FDA approved for NASAL specimens only), is one component of a comprehensive MRSA colonization surveillance program. It is not intended to diagnose MRSA infection nor to guide or monitor treatment for MRSA infections. Performed at [facility], [address] [place], [place] [address]      Labs: BNP (last 3 results) Recent Labs    01/18/18 1341 01/25/18 0749 02/21/18 1717  BNP 254.3* 507.3* 252.7*   Basic Metabolic Panel: Recent Labs  Lab 02/22/18 0323 02/23/18 0216 02/24/18 0512 02/25/18 0408 02/26/18 0428 02/27/18 0812  NA 144 144 145 139 141 143  K 3.2* 3.4* 4.2 3.3* 3.6 3.3*  CL 97* 96* 92* 87* 88* 91*  CO2 36* 37* 43* 38* 40* 41*  GLUCOSE 124* 121* 107* 108* 120* 105*  BUN 22 25* 23 31* 43* 53*  CREATININE 1.14* 1.34* 0.99 1.21* 1.48* 1.53*  CALCIUM 9.1 9.0 9.6 9.6 9.9 9.5  MG 2.5*  --   --   --   --   --    Liver Function Tests: Recent Labs  Lab 02/21/18 1700  AST 16  ALT 18  ALKPHOS 66  BILITOT 0.8  PROT 6.1*  ALBUMIN 3.3*   No results for input(s): LIPASE, AMYLASE in the last 168 hours. No results for input(s): AMMONIA in the last 168 hours. CBC: Recent Labs  Lab 02/21/18 1700 02/22/18 0323 02/23/18 0216  02/24/18 0512  WBC 6.7 7.5 8.0 7.2  NEUTROABS 4.6  --   --   --   HGB 9.8* 10.4* 9.8*  9.7*  HCT 32.3* 35.1* 33.0* 33.1*  MCV 98.5 99.4 101.2* 101.2*  PLT 176 170 163 158   Cardiac Enzymes: Recent Labs  Lab 02/21/18 1700  TROPONINI <0.03   BNP: Invalid input(s): POCBNP CBG: No results for input(s): GLUCAP in the last 168 hours. D-Dimer No results for input(s): DDIMER in the last 72 hours. Hgb A1c No results for input(s): HGBA1C in the last 72 hours. Lipid Profile No results for input(s): CHOL, HDL, LDLCALC, TRIG, CHOLHDL, LDLDIRECT in the last 72 hours. Thyroid function studies No results for input(s): TSH, T4TOTAL, T3FREE, THYROIDAB in the last 72 hours.  Invalid input(s): FREET3 Anemia work up No results for input(s): VITAMINB12, FOLATE, FERRITIN, TIBC, IRON, RETICCTPCT in the last 72 hours. Urinalysis    Component Value Date/Time   COLORURINE STRAW (A) 02/21/2018 2038   APPEARANCEUR CLEAR 02/21/2018 2038   LABSPEC 1.006 02/21/2018 2038   PHURINE 6.0 02/21/2018 2038   GLUCOSEU NEGATIVE 02/21/2018 2038   HGBUR NEGATIVE 02/21/2018 2038   BILIRUBINUR NEGATIVE 02/21/2018 2038   KETONESUR NEGATIVE 02/21/2018 2038   PROTEINUR NEGATIVE 02/21/2018 2038   NITRITE NEGATIVE 02/21/2018 2038   LEUKOCYTESUR NEGATIVE 02/21/2018 2038   Sepsis Labs Invalid input(s): PROCALCITONIN,  WBC,  LACTICIDVEN Microbiology Recent Results (from the past 240 hour(s))  MRSA PCR Screening     Status: None   Collection Time: 02/21/18  9:38 PM  Result Value Ref Range Status   MRSA by PCR NEGATIVE NEGATIVE Final    Comment:        The GeneXpert MRSA Assay (FDA approved for NASAL specimens only), is one component of a comprehensive MRSA colonization surveillance program. It is not intended to diagnose MRSA infection nor to guide or monitor treatment for MRSA infections. Performed at [facility], [address] [place], [place] [address]      Time coordinating discharge: 40 minutes       SIGNED:   Alberteen Sam, MD  [facility] 02/27/2018,  10:11 PM

## 2018-02-27 NOTE — Care Management Note (Signed)
Case Management Note  Patient Details  Name: [name] MRN: [id number] Date of Birth: 04/10/1941  Subjective/Objective:                    Action/Plan: CM consulted for outpatient palliative care. This has to go through patients PCP. CM called Dr Jone Baseman office and placed request for outpatient palliative. CM following.  Expected Discharge Date:                  Expected Discharge Plan:  Home w Home Health Services  In-House Referral:  Clinical Social Work  Discharge planning Services  CM Consult  Post Acute Care Choice:    Choice offered to:  Patient  DME Arranged:    DME Agency:     HH Arranged:  RN, Disease Management, PT, Nurse's Aide HH Agency:  [facility]  Status of Service:  In process, will continue to follow  If discussed at Long Length of Stay Meetings, dates discussed:    Additional Comments:  Kermit Balo, RN 02/27/2018, 8:46 AM

## 2018-02-27 NOTE — Progress Notes (Addendum)
Patient ID: [name], female   DOB: [birth year], 77 y.o.   MRN: [id number]     Advanced Heart Failure Rounding Note  PCP-Cardiologist: No primary care provider on file.   Subjective:    Weight down 1 lb. Started back on torsemide last night.   Feeling OK today. Ready to go home. Denies SOB or lightheadedness, but states she hasn't been out of bed.   BMET pending.   RHC Procedural Findings (8/4): Hemodynamics (mmHg) RA 8 RV 40/11 PA 45/12, mean 28 PCWP mean 17 Oxygen saturations: PA 58% AO 100% Cardiac Output (Fick) 4.82  Cardiac Index (Fick) 2.46 PVR 2.3 WU  Objective:   Weight Range: 190 lb 3.2 oz (86.3 kg) Body mass index is 30.7 kg/m.   Vital Signs:   Temp:  [97.5 F (36.4 C)-98.4 F (36.9 C)] 97.5 F (36.4 C) (08/06 0738) Pulse Rate:  [61-89] 61 (08/06 0738) Resp:  [14-31] 17 (08/06 0738) BP: (104-136)/(45-97) 104/45 (08/06 0738) SpO2:  [90 %-100 %] 98 % (08/06 0738) Weight:  [190 lb 3.2 oz (86.3 kg)] 190 lb 3.2 oz (86.3 kg) (08/06 0500) Last BM Date: 02/24/18  Weight change: Filed Weights   02/25/18 0605 02/26/18 0614 02/27/18 0500  Weight: 183 lb 1.6 oz (83.1 kg) 191 lb 9.6 oz (86.9 kg) 190 lb 3.2 oz (86.3 kg)    Intake/Output:   Intake/Output Summary (Last 24 hours) at 02/27/2018 0806 Last data filed at 02/27/2018 0500 Gross per 24 hour  Intake 1020 ml  Output 2500 ml  Net -1480 ml      Physical Exam    General: NAD  HEENT: Normal Neck: Supple. JVP 5-6. Carotids 2+ bilat; no bruits. No thyromegaly or nodule noted. Cor: PMI nondisplaced. RRR, No M/G/R noted Lungs: CTAB, normal effort. Abdomen: Soft, non-tender, non-distended, no HSM. No bruits or masses. +BS  Extremities: No cyanosis, clubbing, or rash. R and LLE trace ankle edema.  Neuro: Alert & orientedx3, cranial nerves grossly intact. moves all 4 extremities w/o difficulty. Affect pleasant   Telemetry   NSR 60s, personally reviewed.   Labs    CBC No results for input(s): WBC,  NEUTROABS, HGB, HCT, MCV, PLT in the last 72 hours. Basic Metabolic Panel Recent Labs    40/98/11 0408 02/26/18 0428  NA 139 141  K 3.3* 3.6  CL 87* 88*  CO2 38* 40*  GLUCOSE 108* 120*  BUN 31* 43*  CREATININE 1.21* 1.48*  CALCIUM 9.6 9.9   Liver Function Tests No results for input(s): AST, ALT, ALKPHOS, BILITOT, PROT, ALBUMIN in the last 72 hours. No results for input(s): LIPASE, AMYLASE in the last 72 hours. Cardiac Enzymes No results for input(s): CKTOTAL, CKMB, CKMBINDEX, TROPONINI in the last 72 hours.  BNP: BNP (last 3 results) Recent Labs    01/18/18 1341 01/25/18 0749 02/21/18 1717  BNP 254.3* 507.3* 252.7*    ProBNP (last 3 results) No results for input(s): PROBNP in the last 8760 hours.   D-Dimer No results for input(s): DDIMER in the last 72 hours. Hemoglobin A1C No results for input(s): HGBA1C in the last 72 hours. Fasting Lipid Panel No results for input(s): CHOL, HDL, LDLCALC, TRIG, CHOLHDL, LDLDIRECT in the last 72 hours. Thyroid Function Tests No results for input(s): TSH, T4TOTAL, T3FREE, THYROIDAB in the last 72 hours.  Invalid input(s): FREET3  Other results:   Imaging    No results found.   Medications:     Scheduled Medications: . amiodarone  200 mg Oral Daily  .  calcium-vitamin D  2 tablet Oral Q breakfast  . ipratropium  0.5 mg Nebulization QID  . isosorbide mononitrate  90 mg Oral Daily  . metoprolol tartrate  50 mg Oral BID  . pantoprazole  40 mg Oral Daily  . potassium chloride SA  40 mEq Oral BID  . predniSONE  40 mg Oral Q breakfast  . rivaroxaban  15 mg Oral Q supper  . simvastatin  20 mg Oral q1800  . sodium chloride flush  3 mL Intravenous Q12H  . spironolactone  25 mg Oral Daily  . torsemide  80 mg Oral BID    Infusions: . sodium chloride      PRN Medications: sodium chloride, acetaminophen, ALPRAZolam, benzonatate, diclofenac sodium, levalbuterol, ondansetron (ZOFRAN) IV, sodium chloride  flush    Assessment/Plan   1. Acute on chronic diastolic CHF: Echo in 7/19 with EF 60-65%, moderate LVH, rheumatic MV with moderate MS (mean gradient 8, MVA 1.58 cm^2), mild-moderate MR, severe TR, PASP 762 mmHg, D-shaped interventricular septum with mildly dilated RV.  Suspect significant RV failure in setting of moderate mitral stenosis and severe TR. She has been very difficult to manage due to combination of CHF, valvular heart disease and severe COPD.  She was admitted again with dyspnea after missing doses of her diuretic at SNF.  She has diuresed well with Lasix IV + metolazone + acetazolamide, these were stopped yesterday and torsemide 80 mg bid begun. Creatinine up to 1.48 today, filling pressures near normal on today's RHC.   - Continue torsemide 80 mg BID. BMET pending.  - Continue  - spironolactone 25 mg daily.  2. CAD: S/P CABG.  - She has potential source of angina from 90% stenosis in distal LCx. The vessel is small at this point and not amenable to PCI.  - No chest pain currently.  - Continue Imdur.  - Continue statin.  - No ASA given Xarelto use.  3. Atrial fibrillation: Paroxysmal.   - Remains in NSR. - Continue amiodarone.  - Continue Xarelto  4. 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  She is not wheezing. - Continue home O2.  5. 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  She also has mild to moderate MR.  Suspect that the MR makes her a poor candidate for mitral valvuloplasty, and she also would not tolerate anesthesia well with severe COPD. She also would be a poor surgical candidate due to frailty and severe COPD.  Suspect our only choice here will be medical management.  6. Lung nodule:  - Concerning for malignancy, plan for PET as outpatient.  - No change to current plan.    From a CHF standpoint, she is ready to go home today or tomorrow.  Will be going home.   HH arranged. Close HF follow up arranged.   HF medications for home.  -  Torsemide 80 mg BID - Xarelto 15 mg daily - Amiodarone 200 daily - Spironolactone 25 daily - Metoprolol 50 mg BID - Imdur 90 daily - Zocor 20 daily - KCl 40 mEq BID  Length of Stay: [address]  Graciella Freer, PA-C  02/27/2018, 8:06 AM  Advanced Heart Failure Team Pager [phone] (M-F; 7a - 4p)  Please contact [facility] for night-coverage after hours (4p -7a ) and weekends on [email or web address]  Agree with the above note.  Would hold torsemide this afternoon and allow BUN/cr to equilibrate, restart the above dosing tomorrow morning.  Can go home with close  followup.   Marca Ancona 02/27/2018 9:12 AM

## 2018-02-27 NOTE — Progress Notes (Signed)
Physical Therapy Treatment Patient Details Name: JET TRAYNHAM MRN: [id number] DOB: 1940-08-17 Today's Date: 02/27/2018    History of Present Illness 77 y.o. female was admitted for SOB and to get heart cath, now is being evaluated to go home.  Pt has been in SNF care with note of poor experience.  Pt has EF 60-65%, moderate LVH, rheumatic MV, mitral regurg, with combined COPD, CHF and mitral  valve disease affecting her presentation.  Admitted to diurese from missing diuretic in SNF.  PMHx:  CHF, OSA, HTN, COPD, 2-3 L home O2, CABG.     PT Comments    Pt showing progress towards goals as she required less assist this session for all mobilities. Continues to be limited by decreased activity tolerance and shortness of breath. Able to tolerate minimal ambulation in room before fatiguing and requiring rest break. Expected to d/c home today with 24/7 assist.   Follow Up Recommendations  SNF     Equipment Recommendations  None recommended by PT    Recommendations for Other Services       Precautions / Restrictions Precautions Precautions: Fall Precaution Comments: watch sats Restrictions Weight Bearing Restrictions: No    Mobility  Bed Mobility Overal bed mobility: Needs Assistance Bed Mobility: Sit to Supine     Supine to sit: HOB elevated Sit to supine: Min assist;HOB elevated   General bed mobility comments: increased time and effort with use of bed rails and HOB elevated. Cues for technique.  Transfers Overall transfer level: Needs assistance Equipment used: Rolling walker (2 wheeled);1 person hand held assist Transfers: Sit to/from Stand Sit to Stand: Min assist Stand pivot transfers: Min guard       General transfer comment: min A to power up from EOB and cues for hand placement. Min guard for pivot to recliner chair.  Ambulation/Gait Ambulation/Gait assistance: Min assist Gait Distance (Feet): 6 Feet Assistive device: Rolling walker (2 wheeled) Gait  Pattern/deviations: Step-through pattern;Decreased stride length Gait velocity: decreased   General Gait Details: Pt able to ambulate 3 ft forward from recliner and 3 ft backwards with min A for backwards walking. Pt very OOB and requiring rest after gait.   Stairs             Wheelchair Mobility    Modified Rankin (Stroke Patients Only)       Balance Overall balance assessment: Needs assistance Sitting-balance support: Feet supported Sitting balance-Leahy Scale: Fair Sitting balance - Comments: able to sit EOB w/o physical assist, but requires UE support   Standing balance support: Bilateral upper extremity supported;During functional activity Standing balance-Leahy Scale: Poor Standing balance comment: reliant on UE support                            Cognition Arousal/Alertness: Awake/alert Behavior During Therapy: WFL for tasks assessed/performed Overall Cognitive Status: Within Functional Limits for tasks assessed                                        Exercises      General Comments        Pertinent Vitals/Pain Pain Assessment: No/denies pain    Home Living                      Prior Function            PT  Goals (current goals can now be found in the care plan section) Acute Rehab PT Goals Patient Stated Goal: get stronger and breathe better PT Goal Formulation: With patient Time For Goal Achievement: 03/12/18 Potential to Achieve Goals: Good Progress towards PT goals: Progressing toward goals    Frequency    Min 3X/week      PT Plan Current plan remains appropriate    Co-evaluation              AM-PAC PT "6 Clicks" Daily Activity  Outcome Measure  Difficulty turning over in bed (including adjusting bedclothes, sheets and blankets)?: A Little Difficulty moving from lying on back to sitting on the side of the bed? : Unable Difficulty sitting down on and standing up from a chair with arms  (e.g., wheelchair, bedside commode, etc,.)?: Unable Help needed moving to and from a bed to chair (including a wheelchair)?: A Little Help needed walking in hospital room?: A Little Help needed climbing 3-5 steps with a railing? : A Lot 6 Click Score: 13    End of Session Equipment Utilized During Treatment: Gait belt;Oxygen Activity Tolerance: Patient tolerated treatment well;Patient limited by fatigue Patient left: with call bell/phone within reach;in chair Nurse Communication: Mobility status PT Visit Diagnosis: Other abnormalities of gait and mobility (R26.89)     Time: 3267-1245 PT Time Calculation (min) (ACUTE ONLY): 16 min  Charges:  $Therapeutic Activity: 8-22 mins                    Kallie Locks, [place] Pager [phone] Acute Rehab   Sheral Apley 02/27/2018, 12:59 PM

## 2018-02-27 NOTE — Care Management Note (Signed)
Case Management Note  Patient Details  Name: [name] MRN: [id number] Date of Birth: Jun 10, 1941  Subjective/Objective:   CHF, COPD, CAD, lung nodule                 Action/Plan: see previous NCM notes Spoke to pt and she had [facility] [place] in the past. Will fax referral to [facility] for outpt Palliative. Spoke to [facility] to make aware of dc home to today with HH, [facility] Protocol. She has oxygen ([employer]) and RW at home. Son, Casimiro Needle will pick up today.     Expected Discharge Date:  02/27/18               Expected Discharge Plan:  Home w Home Health Services  In-House Referral:  Clinical Social Work  Discharge planning Services  CM Consult  Post Acute Care Choice:  Home Health Choice offered to:  Patient  DME Arranged:  N/A DME Agency:  NA  HH Arranged:  RN, Disease Management, PT, Nurse's Aide HH Agency:  [facility], Other - See comment  Status of Service:  Completed, signed off  If discussed at Long Length of Stay Meetings, dates discussed:    Additional Comments:  Elliot Cousin, RN 02/27/2018, 10:53 AM

## 2018-02-28 ENCOUNTER — Telehealth: Payer: Self-pay

## 2018-02-28 NOTE — Telephone Encounter (Signed)
Phone call placed to patient to offer to schedule visit with Palliative Care. Patient shared that [name] nurse to making a visit tomorrow and unsure of time. Plan is to attempt to identify time of visit and contact patient with this information.

## 2018-03-04 ENCOUNTER — Other Ambulatory Visit: Payer: Self-pay | Admitting: Adult Health

## 2018-03-04 DIAGNOSIS — J449 Chronic obstructive pulmonary disease, unspecified: Secondary | ICD-10-CM

## 2018-03-05 ENCOUNTER — Telehealth: Payer: Self-pay

## 2018-03-05 NOTE — Telephone Encounter (Signed)
Patient contacted to schedule a visit with NP. Scheduled visit for 03/06/18

## 2018-03-06 ENCOUNTER — Encounter: Payer: Self-pay | Admitting: Internal Medicine

## 2018-03-06 ENCOUNTER — Other Ambulatory Visit: Payer: Self-pay | Admitting: Internal Medicine

## 2018-03-06 DIAGNOSIS — R0602 Shortness of breath: Secondary | ICD-10-CM

## 2018-03-06 DIAGNOSIS — F339 Major depressive disorder, recurrent, unspecified: Secondary | ICD-10-CM

## 2018-03-06 NOTE — Progress Notes (Signed)
PALLIATIVE CARE CONSULT VISIT   PATIENT NAME: [name] DOB: 01/13/1941 MRN: [id number]  PRIMARY CARE PROVIDER:   Maurice Small, MD  REFERRING PROVIDER:  Maurice Small, MD [address] [place], [place] [address]  RESPONSIBLE PARTY:    [facility]: PT just started, SN  IMPPRESSION/ASSESSMENT:     1.Depresson/Anxiety; multifactorial. -Continues to actively grieve death of her daughter [name] age 78 (due to cancer 2015), and of her brother and sister both within 81 days or each other, about 3 years ago Was followed by Palliative Care ([name]) Monica for a few visits back in March, for supportive counseling.  -Grieving impact of her illness on her quality of life. Finds that she has decreased ability to concentrate due to anxiety, even long enough to enjoy a short home movie. She was on Zoloft in the past, which she mentions was discontinued due to hallucinations. She is not interested in trying any other antidepressants because of her worry of side effects; she is sensitive to many medications. We did some exercises on focusing on the simple joys in her life, mindfulness, hope, and gratitude as mood elevators. She enjoyed a recent family gathering at her home. -Late onset and early awakening insomnia: on clonazepam 0.5 mg qhs. Takes a brief afternoon nap in the afternoon. She wishes to resource her PCP [name]; has appointment [facility] Sept 23rd. A. Will ask PC Monica to continue to follow [name])   2. Dyspnea; COPD / Pulm HTN / CAD / Afib Continues on portable pulse concentrator on 2L; 3 L when under stress or amulating. Oxygen sats low 90's rest; high 80's with walking. Over last week notes if lifts heavy items such a portable. Ambulates with rollator walker. Physical Therapy saw for first visit yesterday, and that went well; able to ambulate to back of home and return. Breathless with extended conversation. She was discharged on Budesonide MDI but had feelings of  dissociation with this med so she stopped it (her pulmonologist is aware).Completed her prednisone course on 03/01/2018. QID nebulizer treatment (Atrovent) keeps her from getting wheezy. She notes increased endurance since home from hospital; able to ambulate about home with rolling walker without excessive dyspnea if she takes her time. She was transfused 2 units PRBCs recent hospitalization. A recent R heart cath revealed stable pressures; no significant disease progression. She has a scale from [facility] in the home to monitor her weights, but it is not working properly. She has been in touch with them and they will send another. She continues in the CHF clinic ([facility] Thurs Aug 15th). Appears compensated by exam today. She is conscientiously following low salt/no added salt diet, and limiting her fluids to approximately 60oz/day. Has f/u with pulmonologist Dr. Jetty Duhamel [facility] Sept 13, also [facility] Nov 25th.   I spent 40 minutes providing this consultation,  from 12pm to 12:40pm. More than 50% of the time in this consultation was spent coordinating communication.   HISTORY OF PRESENT ILLNESS:  HPI: 77 yo female; f/u palliative care. Recent hospitalization 7/31-02/27/2018, and  7/4-7/05/2018  for acute on chronic diastolic CHF (diuresis), Acute on Chronic Respiratory Failure witht hypercapnia, COPD/pulm HTN (IV steroids), Lung nodule/prob adenocarcinoma (no change previous CT July 2018), Acute on CKD stage III (stable CR 1.10), Chronic afib (amiodarone, metoprolol, Xarelto), HTN (metoprolol, amlodipine).  CODE STATUS: Compressions but no intubation/mechanical ventilation.  PPS: 40% HOSPICE ELIGIBILITY/DIAGNOSIS: TBD  PAST MEDICAL HISTORY:  Past  Medical History:  Diagnosis Date  . Acute parotitis 01/05/2016  . Anxiety   . Bacteremia, escherichia coli   . Bursitis, shoulder    bilateral  . CHF (congestive heart failure) (HCC)    chronic diasotlic CHF  .  Complication of anesthesia    "have trouble getting me awake sometimes; been ok latelhy" (09/24/2015)  . COPD (chronic obstructive pulmonary disease) (HCC)   . Coronary heart disease 2001   s/p CABG  . Depression   . DJD (degenerative joint disease)   . Dyspnea   . GERD (gastroesophageal reflux disease)   . H/O hiatal hernia   . History of rheumatic heart disease    X 3-LAST TIME WHEN PATIENT WAS 77 YRS OLD  . Hyperlipidemia   . Hypertension   . OA (osteoarthritis)   . On home oxygen therapy    "2L; 24/7" (09/24/2015)  . PUD (peptic ulcer disease)   . Pulmonary HTN (HCC)    PASP 50-40mmHg by [id number]/8453  . Sleep apnea    intolerant to CPAP  . Thyroid nodule   . Urinary incontinence   . Urine incontinence   . Varicose veins     SOCIAL HX:  Social History   Tobacco Use  . Smoking status: Former Smoker    Packs/day: 1.00    Years: 47.00    Pack years: 47.00    Types: Cigarettes    Last attempt to quit: 07/26/2007    Years since quitting: 10.6  . Smokeless tobacco: Never Used  Substance Use Topics  . Alcohol use: No    ALLERGIES:  Allergies  Allergen Reactions  . Lisinopril Cough    Developed persistent dry cough for a month.   . Tape Other (See Comments)    Bleeding  . Cefpodoxime Other (See Comments)    Yeast infections   . Codeine Other (See Comments)    Anxious/ jittery  . Lipitor [Atorvastatin] Other (See Comments)    Made joint start hurting/ Muscle aches  . Other Swelling    Venholin HFA  . Penicillins Other (See Comments)    childhood reaction Has patient had a PCN reaction causing immediate rash, facial/tongue/throat swelling, SOB or lightheadedness with hypotension: unknown Has patient had a PCN reaction causing severe rash involving mucus membranes or skin necrosis: No Has patient had a PCN reaction that required hospitalization No Has patient had a PCN reaction occurring within the last 10 years: No If all of the above answers are "NO", then may  proceed with Cephalosporin use.   Terald Sleeper [Kdc:Albuterol] Other (See Comments)    "jittery"  . Xopenex [Levalbuterol] Other (See Comments)    Made mouth and throat sore  . Anoro Ellipta [Umeclidinium-Vilanterol] Itching  . Clindamycin Other (See Comments)    Unknown   . Hydrocodone-Acetaminophen Anxiety  . Latex Itching and Rash     PERTINENT MEDICATIONS:  Outpatient Encounter Medications as of 03/06/2018  Medication Sig  . acetaminophen (TYLENOL) 500 MG tablet Take 1,000 mg by mouth 2 (two) times daily as needed for headache.   Marland Kitchen amiodarone (PACERONE) 200 MG tablet Take 1 tablet (200 mg total) by mouth daily.  . benzonatate (TESSALON) 200 MG capsule Take 1 capsule (200 mg total) by mouth 3 (three) times daily as needed for cough.  . calcium-vitamin D (OSCAL WITH D) 500-200 MG-UNIT per tablet Take 2 tablets by mouth daily.   . clonazePAM (KLONOPIN) 0.5 MG tablet Take 1 tablet (0.5 mg total) by mouth at bedtime.  Marland Kitchen  diclofenac sodium (VOLTAREN) 1 % GEL Apply 2 g topically daily as needed (pain).  Marland Kitchen docusate sodium (COLACE) 100 MG capsule Take 200 mg by mouth as needed for mild constipation.  . ferrous gluconate (FERGON) 324 MG tablet Take 324 mg by mouth daily.  Marland Kitchen ipratropium (ATROVENT) 0.02 % nebulizer solution Take 2.5 mLs (0.5 mg total) by nebulization 4 (four) times daily. Dx: J44.9  . isosorbide mononitrate (IMDUR) 30 MG 24 hr tablet Take 90 mg by mouth daily.  Marland Kitchen loratadine (CLARITIN) 10 MG tablet Take 10 mg by mouth daily.   . metoprolol tartrate (LOPRESSOR) 50 MG tablet Take 1 tablet (50 mg total) by mouth 2 (two) times daily.  Marland Kitchen NITROSTAT 0.4 MG SL tablet place 1 tablet under the tongue if needed every 5 minutes for chest pain for 3 doses IF NO RELIEF AFTER 3RD DOSE CALL PRESCRIBER OR 911.  . omeprazole (PRILOSEC) 40 MG capsule Take 40 mg by mouth daily.  . OXYGEN Inhale 2-3 L into the lungs continuous. At rest 2 liters  Moving around 3 liters  . potassium chloride SA  (K-DUR,KLOR-CON) 20 MEQ tablet TAKE 2 TABLETS BY MOUTH TWO TIMES DAILY (Patient taking differently: TAKE 40 MEQ BY MOUTH TWO TIMES DAILY)  . predniSONE (DELTASONE) 20 MG tablet Take 2 tablets (40 mg total) by mouth daily with breakfast.  . Rivaroxaban (XARELTO) 15 MG TABS tablet Take 1 tablet (15 mg total) by mouth daily with supper.  . simvastatin (ZOCOR) 20 MG tablet TAKE 3 TABLETS BY MOUTH  DAILY AT 6 PM  . spironolactone (ALDACTONE) 25 MG tablet TAKE 1 TABLET BY MOUTH  DAILY (Patient taking differently: TAKE 25MG  BY MOUTH  DAILY)  . torsemide (DEMADEX) 20 MG tablet Take 4 tablets (80 mg total) by mouth 2 (two) times daily. (Patient taking differently: Take 40-80 mg by mouth See admin instructions. Take 4 tablets (80mg ) every morning. Then take 2 tablets (40mg ) every evening.)   No facility-administered encounter medications on file as of 03/06/2018.     PHYSICAL EXAM:   General: NAD, frail appearing, thin Cardiovascular:distant heart sounds; systolic murmur RUSB regular rate and rhythm Pulmonary: bibasilar soft inspiratory crackles; otherwise diminished but clear. Abdomen: soft, nontender, + bowel sounds GU: no suprapubic tenderness Extremities: bilateral LE swelling, softly pitting to mid calf, no joint deformities Skin: no rashes Neurological: Weakness but otherwise nonfocal  Anselm Lis, NP

## 2018-03-07 ENCOUNTER — Telehealth (HOSPITAL_COMMUNITY): Payer: Self-pay | Admitting: *Deleted

## 2018-03-07 NOTE — Telephone Encounter (Signed)
[employer] case manager called and sent fax report, pt's wt has been increasing since she was d/c'd from hospital on 8/6.  Per chart pt wt 190 lb on 8/6 and today she is up to 204 lbs.  Pt report not much urine output, does have LE edema.  Pt is sch for f/u in our office tomorrow at 11:30.  Per Duwaine Maxin, NP pt can take Metolazone 2.5 mg tonight if she can get and keep appt. Tomorrow.  Spoke w/pt, she states not sure she could get med tonight and since it is getting late she doesn't want to take it tonight.  She will keep appt tomorrow, she states she is trying to keep feet elevated to help w/edema also.

## 2018-03-07 NOTE — Progress Notes (Signed)
[name], female   DOB: 14-Sep-1940, 77 y.o.   MRN: [id number]    Advanced Heart Failure Clinic Note   PCP: Dr Valentina Lucks  Pulmonary: Dr Maple Hudson HF Cardiology: Dr. Shirlee Latch  HPI: [name] is a 77 y.o. female with a history of chronic diastolic CHF, moderate pulmonary HTN, HTN, CAD s/p CABG, COPD on home oxygen, and dyslipidemia referred to the HF clinic by Dr Mayford Knife.    Admitted 3/2 - 09/30/15 with A/C CHF. TEE was done to assess mitral stenosis, appeared mild to moderate, rheumatic valve.  TEE also showed aortic valve vegetation and concern arose for endocarditis with recent "tooth infection" per pt.  ID saw. 1 of 2 admission cultures with Gram + cocci in clusters thought to be contaminant.  2 further sets of BCx negative. RHC showed elevated right and left heart filling pressures.  Aortic valve was not crossed for full mitral stenosis assessment due to aortic valve vegetation. Diuresed 8 lbs. Discharge weight 175 lbs.   Due to increased dyspnea and exertional chest pain, she had RHC/LHC in 7/17.  Bypass grafts were patent but there was 90% stenosis in the distal LCx that could account for angina.  It was a small caliber vessel and not amenable to PCI. Filling pressures were near-normal on RHC.   Admitted 11/18 through 06/18/16 with chest pain and increased dyspnea. Exacerbation was thought to be from A fib RVR. She underwent successful DC/CV on 11/19. Diuresed with IV lasix and transitioned to  Discharged on amio 200 mg twice a day+ xarelto 15 mg daily. Discharge weight was 175 pounds.    Echo in 12/17 was stable with EF 55-60% and mild to moderate mitral stenosis, mild MR, mild AS.    Last echo in 2/19 showed EF 55-60% with septal flattening and mild RV dilation, moderate TR, mild AI, mild-moderate mitral stenosis mean gradient 6 mmHg, PHT 1.33 cm^2.    She was admitted in 6/19 with COPD exacerbation.  Diuretic was held while in hospital and she developed volume overload requiring  IV diuresis.  She was discharged to SNF but only stayed 2 days.    Admitted 7/31-02/27/18 with A/C diastolic HF. AHF team consulted. Required BiPAP briefly. Diuresed with IV lasix, metolazone, and diamox. Transitioned to torsemide 80 mg BID. RHC showed mildly elevated filling pressures and preserved CO. DCd with [facility] for PT, RN, and aide. DC weight: 190 lbs.  She returns today for post hospital follow up. [facility] called about 13 lb weight gain in 3 days, but pt states they think the scale was wrong and have sent her a new scale. Overall doing okay. SOB is about the same. She gets SOB after talking a lot and with any activity. She has BLE edema that is coming back, gets worse after sitting up all day. She also has some abdominal bloating. Does not like TED hose/wraps. She has chronic orthopnea and sleeps on 2 pillows at baseline. Denies CP. She has chronic dizziness when she first gets up in the morning that is unchanged. UOP has tapered off a little. Denies bleeding on Xarelto. She is drinking >2 L fluid and eating soups and lots of watermelon. Trying to limit salt intake. Taking all medications. Weight up 7 lbs on our scale.   RHC 02/26/18: RA 8 RV 40/11 PA 45/12, mean 28 PCWP mean 17 Oxygen saturations: PA 58% AO 100% Cardiac Output (Fick) 4.82  Cardiac Index (Fick) 2.46 PVR 2.3 WU  ECG (personally reviewed): NSR,  LAFB, RBBB  - ECHO 12/2014 EF 55-60%, peak PA pressure 68 mmHg, mild to moderate MS. - ECHO 2/17 EF 60-65%, mild LVH, mildly dilated RV with mildly decreased systolic function, PASP 29 mmHg (not sure we got a full envelope), moderate mitral stenosis with mean gradient 6 mmHg and MVA 1.37 cm^2, mild MR, mild AI, aortic sclerosis without significant stenosis.   - TEE 09/24/15 EF 60%, small 0.8 cm mobile mass on the LV side of the aortic valve. Mild AI and AS. 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 A small PFO with a weakly positive bubble study,  Moderate TR - Echo (12/17): EF 55-60%, mild aortic stenosis, mild aortic insufficiency, rheumatic mitral valve with mild to moderate mitral stenosis (MVA 1.6 cm^2, mean gradient 8 mmHg), mild MR, moderate TR, PASP 68 mmHg.  - Echo (2/19): EF 55-60% with septal flattening and mild RV dilation, moderate TR, mild AI, mild-moderate mitral stenosis mean gradient 6 mmHg, PHT 1.33 cm^2.   PFTs (2/17): moderate-severe obstructive airways disease.   Sleep study 2017 was negative, no significant OSA.   RHC 09/24/15 (did not do LHC and cross valve to assess mitral stenosis due to aortic valve vegetation): Hemodynamics (mmHg) RA mean 16 RV 65/14 PA 71/28, mean 45 PCWP mean 32 Cardiac Output (Fick) 4.85  Cardiac Index (Fick) 2.51 PVR 2.68 WU  LHC/RHC (7/17): 50% pLAD, patent LIMA-LAD, totally occluded PLOM, patent SVG-PLOM, AV LCx distal to PLOM with 90% stenosis (small caliber), totally occluded PLV, patent SVG-PLV.  RA mean 5 PA 43/13 PCWP mean 17 CI 2.7 PVR 1.9 WU Transient CHB when LV was entered, so unable to measure gradient across MV.   Labs 01/09/2015 K 4.3 Creatinine 0.91 BNP  Labs 02/19/2015 K 4.1 Creatinine 1.03 Labs 2/17 LDL 82 Labs 10/05/15 K 3.8, Creatinine 0.89  Labs 10/08/15 K 4.6, Creatinine 0.7 Labs 6/17 K 4.3, creatinine 0.95, HCT 32.7, BNP 221 Labs 7/17 BNP 286, K 4.2, creatinine 1.0, HCT 31.2 Labs 10/17 K 3.5, creatinine 1.18, LDL 78 Labs 1/18 K 4.2, creatinine 1.5 Labs 3/18: BNP 175, K 3.5, creatinine 1.54 Labs 6/19: K 4, creatinine 1.33  Past Medical History:  Diagnosis Date  . Acute parotitis 01/05/2016  . Anxiety   . Bacteremia, escherichia coli   . Bursitis, shoulder    bilateral  . CHF (congestive heart failure) (HCC)    chronic diasotlic CHF  . Complication of anesthesia    "have trouble getting me awake sometimes; been ok latelhy" (09/24/2015)  . COPD (chronic obstructive pulmonary disease) (HCC)   . Coronary heart disease 2001   s/p CABG  . Depression   .  DJD (degenerative joint disease)   . Dyspnea   . GERD (gastroesophageal reflux disease)   . H/O hiatal hernia   . History of rheumatic heart disease    X 3-LAST TIME WHEN Monica WAS 77 YRS OLD  . Hyperlipidemia   . Hypertension   . OA (osteoarthritis)   . On home oxygen therapy    "2L; 24/7" (09/24/2015)  . PUD (peptic ulcer disease)   . Pulmonary HTN (HCC)    PASP 50-44mmHg by [id number]  . Sleep apnea    intolerant to CPAP  . Thyroid nodule   . Urinary incontinence   . Urine incontinence   . Varicose veins     Social History: The Monica  reports that she quit smoking about 7 years ago. Her smoking use included Cigarettes. She  has a 47 pack-year smoking history. She has never used smokeless tobacco. She reports that she does not drink alcohol or use illicit drugs.   Family History: The Monica's family history includes Anemia in her mother; Emphysema in her father; Esophageal cancer in her daughter.   Review of systems complete and found to be negative unless listed in HPI.   Current Outpatient Medications  Medication Sig Dispense Refill  . acetaminophen (TYLENOL) 500 MG tablet Take 1,000 mg by mouth 2 (two) times daily as needed for headache.     Marland Kitchen amiodarone (PACERONE) 200 MG tablet Take 1 tablet (200 mg total) by mouth daily. 90 tablet 3  . benzonatate (TESSALON) 200 MG capsule Take 1 capsule (200 mg total) by mouth 3 (three) times daily as needed for cough. 90 capsule 0  . calcium-vitamin D (OSCAL WITH D) 500-200 MG-UNIT per tablet Take 2 tablets by mouth daily.     . clonazePAM (KLONOPIN) 0.5 MG tablet Take 1 tablet (0.5 mg total) by mouth at bedtime. 10 tablet 0  . diclofenac sodium (VOLTAREN) 1 % GEL Apply 2 g topically daily as needed (pain).    Marland Kitchen docusate sodium (COLACE) 100 MG capsule Take 200 mg by mouth as needed for mild constipation.    . ferrous gluconate (FERGON) 324 MG tablet Take 324 mg by mouth daily.    Marland Kitchen ipratropium (ATROVENT) 0.02 % nebulizer solution  INHALE THE CONTENTS OF 1  VIAL VIA NEBULIZER 4 TIMES  DAILY 875 mL 2  . isosorbide mononitrate (IMDUR) 30 MG 24 hr tablet Take 90 mg by mouth daily.    Marland Kitchen loratadine (CLARITIN) 10 MG tablet Take 10 mg by mouth daily.     . metoprolol tartrate (LOPRESSOR) 50 MG tablet Take 1 tablet (50 mg total) by mouth 2 (two) times daily. 14 tablet 0  . NITROSTAT 0.4 MG SL tablet place 1 tablet under the tongue if needed every 5 minutes for chest pain for 3 doses IF NO RELIEF AFTER 3RD DOSE CALL PRESCRIBER OR 911. 25 tablet 0  . omeprazole (PRILOSEC) 40 MG capsule Take 40 mg by mouth daily.  12  . OXYGEN Inhale 2-3 L into the lungs continuous. At rest 2 liters  Moving around 3 liters    . potassium chloride SA (K-DUR,KLOR-CON) 20 MEQ tablet TAKE 2 TABLETS BY MOUTH TWO TIMES DAILY (Monica taking differently: TAKE 40 MEQ BY MOUTH TWO TIMES DAILY) 360 tablet 6  . predniSONE (DELTASONE) 20 MG tablet Take 2 tablets (40 mg total) by mouth daily with breakfast. (Monica not taking: Reported on 03/06/2018) 4 tablet 0  . Rivaroxaban (XARELTO) 15 MG TABS tablet Take 1 tablet (15 mg total) by mouth daily with supper. 90 tablet 3  . simvastatin (ZOCOR) 20 MG tablet TAKE 3 TABLETS BY MOUTH  DAILY AT 6 PM 270 tablet 3  . spironolactone (ALDACTONE) 25 MG tablet TAKE 1 TABLET BY MOUTH  DAILY (Monica taking differently: TAKE 25MG  BY MOUTH  DAILY) 90 tablet 3  . torsemide (DEMADEX) 20 MG tablet Take 4 tablets (80 mg total) by mouth 2 (two) times daily. 720 tablet 3   No current facility-administered medications for this encounter.     Vitals:   03/08/18 1148  BP: 120/62  Pulse: (!) 57  SpO2: 90%  Weight: 89.4 kg (197 lb)   Wt Readings from Last 3 Encounters:  03/08/18 89.4 kg (197 lb)  02/27/18 86.3 kg (190 lb 3.2 oz)  02/14/18 92.1 kg (203 lb)  PHYSICAL EXAM: General: Elderly. Frail. No resp difficulty. Arrived in wheelchair. HEENT: Normal Neck: Supple. JVP difficult but does not appear elevated. Carotids 2+  bilat; no bruits. No thyromegaly or nodule noted. Cor: PMI nondisplaced. RRR, 2/6 SEM RUSB with clear S2 Lungs: diminished in bases. On 3 L O2 Abdomen: Soft, non-tender, non-distended, no HSM. No bruits or masses. +BS  Extremities: No cyanosis, clubbing, or rash. R and LLE 1-2+ edema to knees Neuro: Alert & orientedx3, cranial nerves grossly intact. moves all 4 extremities w/o difficulty. Affect pleasant   ASSESSMENT & PLAN: 1. Chronic diastolic CHF/valvular heart disease with RV failure: Echo in 7/19 with EF 60-65%, moderate LVH, rheumatic MV with moderate MS (mean gradient 8, MVA 1.58 cm^2), mild-moderate MR, severe TR, PASP 762 mmHg, D-shaped interventricular septum with mildly dilated RV. Suspect significant RV failure in setting of moderate mitral stenosis and severe TR. She has been very difficult to manage due to combination of CHF, valvular heart disease and severe COPD. Normal CO on RHC 02/26/18.   - NYHA class IIIb - Volume status elevated.  - Increase torsemide to 100 mg BID for 2 days, then back to 80 mg BID.  - Continue spironolactone 25 mg daily.  - Followed by [facility] with diuretic protocol in place. Discussed that we may need to use IV lasix if weight does not start trending down.  2. CAD:S/P CABG. - She has potential source of angina from 90% stenosis in distal LCx. The vessel is small at this point and not amenable to PCI. - No s/s ischemia. - Continue Imdur 90 mg daily.  - Continue statin.  - No ASA given Xarelto use.  3. Atrial fibrillation: Paroxysmal.  - Regular on exam, rate well controlled. - Continue amiodarone 200 mg daily - Continue Xarelto  - Continue lopressor 50 mg BID 4. COPD - PFTs in 2/17 with moderate to severe obstruction.  On 2-3 liters [place]. - Follows with Dr Maple Hudson 5. 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 She also has mild to moderate MR. Suspect that the MR makes her a poor candidate for mitral valvuloplasty, and  she also would not tolerate anesthesia well with severe COPD. She also would be a poor surgical candidate due to frailty and severe COPD. Suspect our only choice here will be medical management.  6. Lung nodule:  - Concerning for malignancy, planing for PET as outpatient.Follows with Dr Maple Hudson. Has f/u next month.  7. Pulmonary HTN: This is primarily pulmonary venous hypertension based on last RHC. 8. OSA - Intolerant of CPAP. No change 9. Anemia - Has required blood transfusions and IV iron this year. She requests that we check CBC and iron panel today. Denies bleeding.   BMET, CBC, iron panel today Increase torsemide to 100 mg BID for 2 days, then back to 80 mg BID Follow up in 2-3 weeks  Alford Highland 03/08/2018  Greater than 50% of the 25 minute visit was spent in counseling/coordination of care regarding disease state education, salt/fluid restriction, sliding scale diuretics, and medication compliance.

## 2018-03-08 ENCOUNTER — Ambulatory Visit (HOSPITAL_COMMUNITY)
Admission: RE | Admit: 2018-03-08 | Discharge: 2018-03-08 | Disposition: A | Discharge status: Home / Self Care | Payer: Medicare Other | Source: Ambulatory Visit | Attending: Internal Medicine | Admitting: Internal Medicine

## 2018-03-08 ENCOUNTER — Encounter (HOSPITAL_COMMUNITY): Payer: Self-pay

## 2018-03-08 VITALS — BP 120/62 | HR 57 | Wt 197.0 lb

## 2018-03-08 DIAGNOSIS — G4733 Obstructive sleep apnea (adult) (pediatric): Secondary | ICD-10-CM | POA: Insufficient documentation

## 2018-03-08 DIAGNOSIS — Z836 Family history of other diseases of the respiratory system: Secondary | ICD-10-CM | POA: Insufficient documentation

## 2018-03-08 DIAGNOSIS — Z09 Encounter for follow-up examination after completed treatment for conditions other than malignant neoplasm: Secondary | ICD-10-CM | POA: Insufficient documentation

## 2018-03-08 DIAGNOSIS — I482 Chronic atrial fibrillation, unspecified: Secondary | ICD-10-CM

## 2018-03-08 DIAGNOSIS — M1990 Unspecified osteoarthritis, unspecified site: Secondary | ICD-10-CM | POA: Diagnosis not present

## 2018-03-08 DIAGNOSIS — K219 Gastro-esophageal reflux disease without esophagitis: Secondary | ICD-10-CM | POA: Insufficient documentation

## 2018-03-08 DIAGNOSIS — Z791 Long term (current) use of non-steroidal anti-inflammatories (NSAID): Secondary | ICD-10-CM | POA: Insufficient documentation

## 2018-03-08 DIAGNOSIS — D649 Anemia, unspecified: Secondary | ICD-10-CM

## 2018-03-08 DIAGNOSIS — J441 Chronic obstructive pulmonary disease with (acute) exacerbation: Secondary | ICD-10-CM | POA: Insufficient documentation

## 2018-03-08 DIAGNOSIS — Z87891 Personal history of nicotine dependence: Secondary | ICD-10-CM | POA: Diagnosis not present

## 2018-03-08 DIAGNOSIS — Z832 Family history of diseases of the blood and blood-forming organs and certain disorders involving the immune mechanism: Secondary | ICD-10-CM | POA: Insufficient documentation

## 2018-03-08 DIAGNOSIS — Z9981 Dependence on supplemental oxygen: Secondary | ICD-10-CM | POA: Diagnosis not present

## 2018-03-08 DIAGNOSIS — I5032 Chronic diastolic (congestive) heart failure: Secondary | ICD-10-CM | POA: Diagnosis not present

## 2018-03-08 DIAGNOSIS — I2510 Atherosclerotic heart disease of native coronary artery without angina pectoris: Secondary | ICD-10-CM | POA: Diagnosis not present

## 2018-03-08 DIAGNOSIS — I050 Rheumatic mitral stenosis: Secondary | ICD-10-CM | POA: Diagnosis not present

## 2018-03-08 DIAGNOSIS — I480 Paroxysmal atrial fibrillation: Secondary | ICD-10-CM | POA: Insufficient documentation

## 2018-03-08 DIAGNOSIS — E785 Hyperlipidemia, unspecified: Secondary | ICD-10-CM | POA: Diagnosis not present

## 2018-03-08 DIAGNOSIS — J9611 Chronic respiratory failure with hypoxia: Secondary | ICD-10-CM

## 2018-03-08 DIAGNOSIS — Z7901 Long term (current) use of anticoagulants: Secondary | ICD-10-CM | POA: Insufficient documentation

## 2018-03-08 DIAGNOSIS — F419 Anxiety disorder, unspecified: Secondary | ICD-10-CM | POA: Diagnosis not present

## 2018-03-08 DIAGNOSIS — Z800 Family history of malignant neoplasm of digestive organs: Secondary | ICD-10-CM | POA: Diagnosis not present

## 2018-03-08 DIAGNOSIS — R911 Solitary pulmonary nodule: Secondary | ICD-10-CM | POA: Diagnosis not present

## 2018-03-08 DIAGNOSIS — F329 Major depressive disorder, single episode, unspecified: Secondary | ICD-10-CM | POA: Diagnosis not present

## 2018-03-08 DIAGNOSIS — J449 Chronic obstructive pulmonary disease, unspecified: Secondary | ICD-10-CM | POA: Diagnosis not present

## 2018-03-08 DIAGNOSIS — Z951 Presence of aortocoronary bypass graft: Secondary | ICD-10-CM | POA: Insufficient documentation

## 2018-03-08 DIAGNOSIS — Z79899 Other long term (current) drug therapy: Secondary | ICD-10-CM | POA: Insufficient documentation

## 2018-03-08 DIAGNOSIS — I110 Hypertensive heart disease with heart failure: Secondary | ICD-10-CM | POA: Insufficient documentation

## 2018-03-08 LAB — CBC
HCT: 34.9 % — ABNORMAL LOW (ref 36.0–46.0)
Hemoglobin: 10.3 g/dL — ABNORMAL LOW (ref 12.0–15.0)
MCH: 29.5 pg (ref 26.0–34.0)
MCHC: 29.5 g/dL — AB (ref 30.0–36.0)
MCV: 100 fL (ref 78.0–100.0)
PLATELETS: 385 10*3/uL (ref 150–400)
RBC: 3.49 MIL/uL — AB (ref 3.87–5.11)
RDW: 15.6 % — AB (ref 11.5–15.5)
WBC: 11.4 10*3/uL — ABNORMAL HIGH (ref 4.0–10.5)

## 2018-03-08 LAB — BASIC METABOLIC PANEL
ANION GAP: 9 (ref 5–15)
BUN: 19 mg/dL (ref 8–23)
CALCIUM: 9 mg/dL (ref 8.9–10.3)
CHLORIDE: 98 mmol/L (ref 98–111)
CO2: 34 mmol/L — ABNORMAL HIGH (ref 22–32)
CREATININE: 1.27 mg/dL — AB (ref 0.44–1.00)
GFR calc Af Amer: 46 mL/min — ABNORMAL LOW (ref >60)
GFR calc non Af Amer: 39 mL/min — ABNORMAL LOW (ref >60)
GLUCOSE: 119 mg/dL — AB (ref 70–99)
POTASSIUM: 3.6 mmol/L (ref 3.5–5.1)
SODIUM: 141 mmol/L (ref 135–145)

## 2018-03-08 LAB — IRON AND TIBC
IRON: 179 ug/dL — AB (ref 28–170)
Saturation Ratios: 46 % — ABNORMAL HIGH (ref 10.4–31.8)
TIBC: 388 ug/dL (ref 250–450)
UIBC: 209 ug/dL

## 2018-03-08 LAB — FERRITIN: Ferritin: 60 ng/mL (ref 11–307)

## 2018-03-08 NOTE — Patient Instructions (Signed)
Routine lab work today. Will notify you of abnormal results, otherwise no news is good news!  INCREASE Torsemide to 100 mg twice daily for TWO DAYS, then back to 80 mg twice daily.  Follow up 2-3 weeks.  __________________________________________________________________________ [name] Code:  Take all medication as prescribed the day of your appointment. Bring all medications with you to your appointment.  Do the following things EVERYDAY: 1) Weigh yourself in the morning before breakfast. Write it down and keep it in a log. 2) Take your medicines as prescribed 3) Eat low salt foods-Limit salt (sodium) to 2000 mg per day.  4) Stay as active as you can everyday 5) Limit all fluids for the day to less than 2 liters

## 2018-03-08 NOTE — Addendum Note (Signed)
Encounter addended by: Theresia Bough, CMA on: 03/08/2018 4:56 PM  Actions taken: Order list changed, Diagnosis association updated

## 2018-03-14 ENCOUNTER — Other Ambulatory Visit (HOSPITAL_COMMUNITY): Payer: Self-pay | Admitting: Cardiology

## 2018-03-14 MED ORDER — AMIODARONE HCL 200 MG PO TABS: 200.0000 mg | ORAL_TABLET | Freq: Every day | ORAL | 3 refills | Status: DC

## 2018-03-21 ENCOUNTER — Telehealth (HOSPITAL_COMMUNITY): Payer: Self-pay | Admitting: Pharmacist

## 2018-03-21 ENCOUNTER — Other Ambulatory Visit (HOSPITAL_COMMUNITY): Payer: Self-pay | Admitting: Internal Medicine

## 2018-03-21 MED ORDER — POTASSIUM CHLORIDE CRYS ER 20 MEQ PO TBCR: 40.0000 meq | EXTENDED_RELEASE_TABLET | Freq: Two times a day (BID) | ORAL | 0 refills | Status: DC

## 2018-03-21 MED ORDER — AMIODARONE HCL 200 MG PO TABS: 200.0000 mg | ORAL_TABLET | Freq: Every day | ORAL | 0 refills | Status: AC

## 2018-03-21 NOTE — Telephone Encounter (Signed)
refill 

## 2018-03-22 ENCOUNTER — Ambulatory Visit (HOSPITAL_COMMUNITY)
Admission: RE | Admit: 2018-03-22 | Discharge: 2018-03-22 | Disposition: A | Discharge status: Home / Self Care | Payer: Medicare Other | Source: Ambulatory Visit | Attending: Internal Medicine | Admitting: Internal Medicine

## 2018-03-22 ENCOUNTER — Encounter (HOSPITAL_COMMUNITY): Payer: Self-pay

## 2018-03-22 VITALS — BP 118/64 | HR 61 | Wt 191.0 lb

## 2018-03-22 DIAGNOSIS — J449 Chronic obstructive pulmonary disease, unspecified: Secondary | ICD-10-CM

## 2018-03-22 DIAGNOSIS — I25119 Atherosclerotic heart disease of native coronary artery with unspecified angina pectoris: Secondary | ICD-10-CM | POA: Diagnosis not present

## 2018-03-22 DIAGNOSIS — I480 Paroxysmal atrial fibrillation: Secondary | ICD-10-CM | POA: Insufficient documentation

## 2018-03-22 DIAGNOSIS — R911 Solitary pulmonary nodule: Secondary | ICD-10-CM | POA: Diagnosis not present

## 2018-03-22 DIAGNOSIS — Z79899 Other long term (current) drug therapy: Secondary | ICD-10-CM | POA: Insufficient documentation

## 2018-03-22 DIAGNOSIS — I5032 Chronic diastolic (congestive) heart failure: Secondary | ICD-10-CM | POA: Diagnosis not present

## 2018-03-22 DIAGNOSIS — I080 Rheumatic disorders of both mitral and aortic valves: Secondary | ICD-10-CM | POA: Insufficient documentation

## 2018-03-22 DIAGNOSIS — Z951 Presence of aortocoronary bypass graft: Secondary | ICD-10-CM | POA: Diagnosis not present

## 2018-03-22 DIAGNOSIS — I110 Hypertensive heart disease with heart failure: Secondary | ICD-10-CM | POA: Insufficient documentation

## 2018-03-22 DIAGNOSIS — Z87891 Personal history of nicotine dependence: Secondary | ICD-10-CM | POA: Insufficient documentation

## 2018-03-22 DIAGNOSIS — I272 Pulmonary hypertension, unspecified: Secondary | ICD-10-CM

## 2018-03-22 DIAGNOSIS — G4733 Obstructive sleep apnea (adult) (pediatric): Secondary | ICD-10-CM | POA: Diagnosis not present

## 2018-03-22 DIAGNOSIS — J9611 Chronic respiratory failure with hypoxia: Secondary | ICD-10-CM | POA: Diagnosis not present

## 2018-03-22 DIAGNOSIS — E785 Hyperlipidemia, unspecified: Secondary | ICD-10-CM | POA: Diagnosis not present

## 2018-03-22 DIAGNOSIS — Z7901 Long term (current) use of anticoagulants: Secondary | ICD-10-CM | POA: Insufficient documentation

## 2018-03-22 DIAGNOSIS — D649 Anemia, unspecified: Secondary | ICD-10-CM | POA: Diagnosis not present

## 2018-03-22 DIAGNOSIS — I482 Chronic atrial fibrillation, unspecified: Secondary | ICD-10-CM

## 2018-03-22 DIAGNOSIS — I050 Rheumatic mitral stenosis: Secondary | ICD-10-CM

## 2018-03-22 DIAGNOSIS — K219 Gastro-esophageal reflux disease without esophagitis: Secondary | ICD-10-CM | POA: Diagnosis not present

## 2018-03-22 DIAGNOSIS — Z9981 Dependence on supplemental oxygen: Secondary | ICD-10-CM | POA: Diagnosis not present

## 2018-03-22 LAB — BASIC METABOLIC PANEL
ANION GAP: 12 (ref 5–15)
BUN: 16 mg/dL (ref 8–23)
CALCIUM: 9.2 mg/dL (ref 8.9–10.3)
CO2: 36 mmol/L — AB (ref 22–32)
Chloride: 95 mmol/L — ABNORMAL LOW (ref 98–111)
Creatinine, Ser: 1.43 mg/dL — ABNORMAL HIGH (ref 0.44–1.00)
GFR calc Af Amer: 40 mL/min — ABNORMAL LOW (ref >60)
GFR, EST NON AFRICAN AMERICAN: 34 mL/min — AB (ref >60)
Glucose, Bld: 106 mg/dL — ABNORMAL HIGH (ref 70–99)
POTASSIUM: 4.3 mmol/L (ref 3.5–5.1)
SODIUM: 143 mmol/L (ref 135–145)

## 2018-03-22 LAB — BRAIN NATRIURETIC PEPTIDE: B Natriuretic Peptide: 357.1 pg/mL — ABNORMAL HIGH (ref 0.0–100.0)

## 2018-03-22 MED ORDER — NITROGLYCERIN 0.4 MG SL SUBL: SUBLINGUAL_TABLET | SUBLINGUAL | 1 refills | Status: AC

## 2018-03-22 NOTE — Patient Instructions (Signed)
Routine lab work today. Will notify you of abnormal results, otherwise no news is good news!  No changes to medication at this time.  Follow up 6 weeks.  ________________________________________________________________ [place] Code: 1700  Take all medication as prescribed the day of your appointment. Bring all medications with you to your appointment.  Do the following things EVERYDAY: 1) Weigh yourself in the morning before breakfast. Write it down and keep it in a log. 2) Take your medicines as prescribed 3) Eat low salt foods-Limit salt (sodium) to 2000 mg per day.  4) Stay as active as you can everyday 5) Limit all fluids for the day to less than 2 liters

## 2018-03-22 NOTE — Progress Notes (Signed)
Patient ID: [name], female   DOB: Aug 19, 1940, 77 y.o.   MRN: [id number]    Advanced Heart Failure Clinic Note   PCP: Dr Valentina Lucks  Pulmonary: Dr Maple Hudson HF Cardiology: Dr. Shirlee Latch  HPI: [name] is a 77 y.o. female with a history of chronic diastolic CHF, moderate pulmonary HTN, HTN, CAD s/p CABG, COPD on home oxygen, and dyslipidemia referred to the HF clinic by Dr Mayford Knife.    Admitted 3/2 - 09/30/15 with A/C CHF. TEE was done to assess mitral stenosis, appeared mild to moderate, rheumatic valve.  TEE also showed aortic valve vegetation and concern arose for endocarditis with recent "tooth infection" per pt.  ID saw. 1 of 2 admission cultures with Gram + cocci in clusters thought to be contaminant.  2 further sets of BCx negative. RHC showed elevated right and left heart filling pressures.  Aortic valve was not crossed for full mitral stenosis assessment due to aortic valve vegetation. Diuresed 8 lbs. Discharge weight 175 lbs.   Due to increased dyspnea and exertional chest pain, she had RHC/LHC in 7/17.  Bypass grafts were patent but there was 90% stenosis in the distal LCx that could account for angina.  It was a small caliber vessel and not amenable to PCI. Filling pressures were near-normal on RHC.   Admitted 11/18 through 06/18/16 with chest pain and increased dyspnea. Exacerbation was thought to be from A fib RVR. She underwent successful DC/CV on 11/19. Diuresed with IV lasix and transitioned to  Discharged on amio 200 mg twice a day+ xarelto 15 mg daily. Discharge weight was 175 pounds.    Echo in 12/17 was stable with EF 55-60% and mild to moderate mitral stenosis, mild MR, mild AS.    Last echo in 2/19 showed EF 55-60% with septal flattening and mild RV dilation, moderate TR, mild AI, mild-moderate mitral stenosis mean gradient 6 mmHg, PHT 1.33 cm^2.    She was admitted in 6/19 with COPD exacerbation.  Diuretic was held while in hospital and she developed volume overload requiring  IV diuresis.  She was discharged to SNF but only stayed 2 days.    Admitted 7/31-02/27/18 with A/C diastolic HF. AHF team consulted. Required BiPAP briefly. Diuresed with IV lasix, metolazone, and diamox. Transitioned to torsemide 80 mg BID. RHC showed mildly elevated filling pressures and preserved CO. DCd with AHC for PT, RN, and aide. DC weight: 190 lbs.  She presents today for 2 week follow up. Last visit given extra torsemide for 2 days. Followed by palliative care in the home. She is feeling about the same. Weight down 6 lbs by our scale, but has been stable around 196-197 at home. She remains SOB with mild exertion. Not doing very much. Seated with her legs down for the majority of the day. Intolerant to [employer] or [employer]. She has chronic 2 pillow orthopnea and chronic dizziness with rapid standing. Trying to limit salt intake. She is taking all medications as directed. She has a Living Will, and her son is her HCPOA. She would NOT want to be intubated, and does not think she would want CPR.   RHC 02/26/18: RA 8 RV 40/11 PA 45/12, mean 28 PCWP mean 17 Oxygen saturations: PA 58% AO 100% Cardiac Output (Fick) 4.82  Cardiac Index (Fick) 2.46 PVR 2.3 WU  ECG (personally reviewed): NSR, LAFB, RBBB  - ECHO 12/2014 EF 55-60%, peak PA pressure 68 mmHg, mild to moderate MS. - ECHO 2/17 EF 60-65%, mild  LVH, mildly dilated RV with mildly decreased systolic function, PASP 29 mmHg (not sure we got a full envelope), moderate mitral stenosis with mean gradient 6 mmHg and MVA 1.37 cm^2, mild MR, mild AI, aortic sclerosis without significant stenosis.   - TEE 09/24/15 EF 60%, small 0.8 cm mobile mass on the LV side of the aortic valve. Mild AI and AS. 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 A small PFO with a weakly positive bubble study, Moderate TR - Echo (12/17): EF 55-60%, mild aortic stenosis, mild aortic insufficiency, rheumatic mitral valve with mild  to moderate mitral stenosis (MVA 1.6 cm^2, mean gradient 8 mmHg), mild MR, moderate TR, PASP 68 mmHg.  - Echo (2/19): EF 55-60% with septal flattening and mild RV dilation, moderate TR, mild AI, mild-moderate mitral stenosis mean gradient 6 mmHg, PHT 1.33 cm^2.   PFTs (2/17): moderate-severe obstructive airways disease.   Sleep study 2017 was negative, no significant OSA.   RHC 09/24/15 (did not do LHC and cross valve to assess mitral stenosis due to aortic valve vegetation): Hemodynamics (mmHg) RA mean 16 RV 65/14 PA 71/28, mean 45 PCWP mean 32 Cardiac Output (Fick) 4.85  Cardiac Index (Fick) 2.51 PVR 2.68 WU  LHC/RHC (7/17): 50% pLAD, patent LIMA-LAD, totally occluded PLOM, patent SVG-PLOM, AV LCx distal to PLOM with 90% stenosis (small caliber), totally occluded PLV, patent SVG-PLV.  RA mean 5 PA 43/13 PCWP mean 17 CI 2.7 PVR 1.9 WU Transient CHB when LV was entered, so unable to measure gradient across MV.   Labs 01/09/2015 K 4.3 Creatinine 0.91 BNP  Labs 02/19/2015 K 4.1 Creatinine 1.03 Labs 2/17 LDL 82 Labs 10/05/15 K 3.8, Creatinine 0.89  Labs 10/08/15 K 4.6, Creatinine 0.7 Labs 6/17 K 4.3, creatinine 0.95, HCT 32.7, BNP 221 Labs 7/17 BNP 286, K 4.2, creatinine 1.0, HCT 31.2 Labs 10/17 K 3.5, creatinine 1.18, LDL 78 Labs 1/18 K 4.2, creatinine 1.5 Labs 3/18: BNP 175, K 3.5, creatinine 1.54 Labs 6/19: K 4, creatinine 1.33  Past Medical History:  Diagnosis Date  . Acute parotitis 01/05/2016  . Anxiety   . Bacteremia, escherichia coli   . Bursitis, shoulder    bilateral  . CHF (congestive heart failure) (HCC)    chronic diasotlic CHF  . Complication of anesthesia    "have trouble getting me awake sometimes; been ok latelhy" (09/24/2015)  . COPD (chronic obstructive pulmonary disease) (HCC)   . Coronary heart disease 2001   s/p CABG  . Depression   . DJD (degenerative joint disease)   . Dyspnea   . GERD (gastroesophageal reflux disease)   . H/O hiatal hernia   .  History of rheumatic heart disease    X 3-LAST TIME WHEN PATIENT WAS 77 YRS OLD  . Hyperlipidemia   . Hypertension   . OA (osteoarthritis)   . On home oxygen therapy    "2L; 24/7" (09/24/2015)  . PUD (peptic ulcer disease)   . Pulmonary HTN (HCC)    PASP 50-62mmHg by [id number]  . Sleep apnea    intolerant to CPAP  . Thyroid nodule   . Urinary incontinence   . Urine incontinence   . Varicose veins     Social History: The patient  reports that she quit smoking about 7 years ago. Her smoking use included Cigarettes. She has a 47 pack-year smoking history. She has never used smokeless tobacco. She reports that she does not drink alcohol or use illicit  drugs.   Family History: The patient's family history includes Anemia in her mother; Emphysema in her father; Esophageal cancer in her daughter.   Review of systems complete and found to be negative unless listed in HPI.    Current Outpatient Medications  Medication Sig Dispense Refill  . acetaminophen (TYLENOL) 500 MG tablet Take 1,000 mg by mouth 2 (two) times daily as needed for headache.     Marland Kitchen amiodarone (PACERONE) 200 MG tablet Take 1 tablet (200 mg total) by mouth daily. 30 tablet 0  . benzonatate (TESSALON) 200 MG capsule Take 1 capsule (200 mg total) by mouth 3 (three) times daily as needed for cough. 90 capsule 0  . calcium-vitamin D (OSCAL WITH D) 500-200 MG-UNIT per tablet Take 2 tablets by mouth daily.     . clonazePAM (KLONOPIN) 0.5 MG tablet Take 1 tablet (0.5 mg total) by mouth at bedtime. 10 tablet 0  . diclofenac sodium (VOLTAREN) 1 % GEL Apply 2 g topically daily as needed (pain).    Marland Kitchen docusate sodium (COLACE) 100 MG capsule Take 200 mg by mouth as needed for mild constipation.    . ferrous gluconate (FERGON) 324 MG tablet Take 324 mg by mouth daily.    Marland Kitchen ipratropium (ATROVENT) 0.02 % nebulizer solution INHALE THE CONTENTS OF 1  VIAL VIA NEBULIZER 4 TIMES  DAILY 875 mL 2  . isosorbide mononitrate (IMDUR) 30 MG 24 hr  tablet Take 90 mg by mouth daily.    Marland Kitchen loratadine (CLARITIN) 10 MG tablet Take 10 mg by mouth daily.     . metoprolol tartrate (LOPRESSOR) 50 MG tablet Take 1 tablet (50 mg total) by mouth 2 (two) times daily. 14 tablet 0  . nitroGLYCERIN (NITROSTAT) 0.4 MG SL tablet place 1 tablet under the tongue if needed every 5 minutes for chest pain for 3 doses IF NO RELIEF AFTER 3RD DOSE CALL PRESCRIBER OR 911. 9 tablet 1  . omeprazole (PRILOSEC) 40 MG capsule Take 40 mg by mouth daily.  12  . OXYGEN Inhale 2-3 L into the lungs continuous. At rest 2 liters  Moving around 3 liters    . potassium chloride SA (K-DUR,KLOR-CON) 20 MEQ tablet Take 2 tablets (40 mEq total) by mouth 2 (two) times daily. 360 tablet 0  . Rivaroxaban (XARELTO) 15 MG TABS tablet Take 1 tablet (15 mg total) by mouth daily with supper. 90 tablet 3  . simvastatin (ZOCOR) 20 MG tablet TAKE 3 TABLETS BY MOUTH  DAILY AT 6 PM 270 tablet 3  . spironolactone (ALDACTONE) 25 MG tablet TAKE 1 TABLET BY MOUTH  DAILY (Patient taking differently: TAKE 25MG  BY MOUTH  DAILY) 90 tablet 3  . torsemide (DEMADEX) 20 MG tablet Take 4 tablets (80 mg total) by mouth 2 (two) times daily. 720 tablet 3   No current facility-administered medications for this encounter.    Vitals:   03/22/18 1441  BP: 118/64  Weight: 86.6 kg (191 lb)     Wt Readings from Last 3 Encounters:  03/22/18 86.6 kg (191 lb)  03/08/18 89.4 kg (197 lb)  02/27/18 86.3 kg (190 lb 3.2 oz)    PHYSICAL EXAM: General: Elderly. Frail. NAD. In WC.  HEENT: Normal Neck: Supple. JVP 6-7. Carotids 2+ bilat; no bruits. No thyromegaly or nodule noted. Cor: PMI nondisplaced. RRR, 2/6 SEM RUSB with clear S2.  Lungs: Diminished basilar sounds. On 3 L O2.  Abdomen: Soft, non-tender, non-distended, no HSM. No bruits or masses. +BS  Extremities: No cyanosis,  clubbing, or rash. 1-2+ soft edema Neuro: Alert & orientedx3, cranial nerves grossly intact. moves all 4 extremities w/o difficulty.  Affect pleasant   ASSESSMENT & PLAN: 1. Chronic diastolic CHF/valvular heart disease with RV failure: Echo in 7/19 with EF 60-65%, moderate LVH, rheumatic MV with moderate MS (mean gradient 8, MVA 1.58 cm^2), mild-moderate MR, severe TR, PASP 762 mmHg, D-shaped interventricular septum with mildly dilated RV. Suspect significant RV failure in setting of moderate mitral stenosis and severe TR. She has been very difficult to manage due to combination of CHF, valvular heart disease and severe COPD. Normal CO on RHC 02/26/18.   - NYHA IIIb symptoms.  - Volume status ok centrally, but with continued peripheral edema.  - Continue torsemide 80 mg BID.  - Continue spironolactone 25 mg daily.  - Followed by [facility] with diuretic protocol in place. Discussed that we may need to use IV lasix if weight does not start trending down.  2. CAD:S/P CABG. - She has potential source of angina from 90% stenosis in distal LCx. The vessel is small at this point and not amenable to PCI. - No s/s of ischemia.    - Continue Imdur 90 mg daily.  - Continue statin.  - No ASA given Xarelto use.  3. Atrial fibrillation: Paroxysmal.  - Regular on exam.  - Continue amiodarone 200 mg daily - Continue Xarelto  - Continue lopressor 50 mg BID 4. COPD - PFTs in 2/17 with moderate to severe obstruction.  On 2-3 liters [place]. Needs continuous oxygen for travel, now only has pulse and desaturates into 80s.  - Follows with Dr Maple Hudson 5. 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 She also has mild to moderate MR. Suspect that the MR makes her a poor candidate for mitral valvuloplasty, and she also would not tolerate anesthesia well with severe COPD. She also would be a poor surgical candidate due to frailty and severe COPD. Suspect our only choice here will be medical management.  - No change.  6. Lung nodule:  - Concerning for malignancy, planing for PET as outpatient.Follows with Dr Maple Hudson. Has follow  up.   7. Pulmonary HTN:  - This is primarily pulmonary venous hypertension based on last RHC. No change.  8. OSA - Intolerant of CPAP. No change.  9. Anemia - Per PCP.   She verbalized understanding of her gradual decline, and the goal currently is to keep her feeling as well as we can, for as long as we can. She has a living will and has discussed her wishes with her family. She does NOT want intubation, but is on the fence about CPR, as long as she wouldn't need intubation. She will continue to discuss with her family. RTC 6 weeks, Offered sooner. Pt and family know to call with any changes in her symptoms.   Graciella Freer, PA-C  03/22/2018  Greater than 50% of the 25  minute visit was spent in counseling/coordination of care regarding disease state education, salt/fluid restriction, sliding scale diuretics, and medication compliance.

## 2018-03-30 ENCOUNTER — Other Ambulatory Visit (HOSPITAL_COMMUNITY): Payer: Self-pay

## 2018-03-30 MED ORDER — POTASSIUM CHLORIDE CRYS ER 20 MEQ PO TBCR: 40.0000 meq | EXTENDED_RELEASE_TABLET | Freq: Two times a day (BID) | ORAL | 1 refills | Status: AC

## 2018-04-06 ENCOUNTER — Ambulatory Visit: Payer: Medicare Other | Admitting: Internal Medicine

## 2018-04-06 ENCOUNTER — Encounter: Payer: Self-pay | Admitting: Internal Medicine

## 2018-04-06 DIAGNOSIS — J449 Chronic obstructive pulmonary disease, unspecified: Secondary | ICD-10-CM

## 2018-04-06 DIAGNOSIS — J9611 Chronic respiratory failure with hypoxia: Secondary | ICD-10-CM

## 2018-04-06 MED ORDER — GLYCOPYRROLATE-FORMOTEROL 9-4.8 MCG/ACT IN AERO: 2.0000 | INHALATION_SPRAY | Freq: Two times a day (BID) | RESPIRATORY_TRACT | 12 refills | Status: DC

## 2018-04-06 MED ORDER — GLYCOPYRROLATE-FORMOTEROL 9-4.8 MCG/ACT IN AERO: 2.0000 | INHALATION_SPRAY | Freq: Two times a day (BID) | RESPIRATORY_TRACT | 0 refills | Status: DC

## 2018-04-06 NOTE — Progress Notes (Signed)
Patient ID: [name], female    DOB: 1941-01-29, 77 y.o.   MRN: [id number]   F former smoker followed for female former smoker followed for COPD, Lung Nodules,  PLMS,  complicated by hypertension, CHF, CAD/MI/ AFib, pulmonary hypertension Sleep study on 02/22/16 showed no OSA with AHI 0. Severe PLM  PFT 10/26/16-severe obstructive airways disease, insignificant response, severe restriction, severe diffusion defect.  FVC 1.38/46%, FEV1 0.98/43%, ratio 0.71, TLC 69%, DLCO 42% ----------------------------------------------------------------------------------------------   02/14/2018- 68 year-old female former smoker followed for COPD, Chronic Respiratory Failure, lung nodules, PLMS, complicated by  hypertension, CHF, CAD/MI/ chronic  AFib/ Xarelto O2 2 L/[facility]  Acute hospitalization 7/4-7/11: Acute on chronic respiratory failure with hypercapnia, pulmonary hypertension, chronic anticoagulation, acute on chronic diastolic heart failure.  Treated with diuretics, IV steroids with discharge on prednisone taper, required transfusion. Says her breathing is good with little cough, dry.  Some rhinitis helped by Claritin.  Continues oxygen.  Finished prednisone.  Occasional lightheadedness.  Now living at [facility].  Unable to shed the fluid weight she put on in hospital. We discussed her CT report, status of heart failure and lung nodule. CT chest 01/30/2018 IMPRESSION: 1. Mild CHF, with evidence of mild diffuse interstitial pulmonary edema. 2. BILATERAL lower lobe atelectasis. 3. LEFT apical lung nodule, previously ground-glass, now has a more solid appearance, though is not significantly changed in size since the CT in July, 2018. Adenocarcinoma is considered. Thoracic surgery consultation is recommended. PET/CT should be considered for staging purposes. These recommendations are taken from: Recommendations for the Management of Subsolid Pulmonary Nodules Detected at CT: A Statement from the  Fleischner Society Radiology 2013; 266:1, 304-317. 4. Calcification involving the wall of the LEFT lower lobe pulmonary artery indicates pulmonary arterial hypertension. 5. Hepatic cirrhosis is suspected, as there is relative enlargement of the LEFT lobe compared to the RIGHT lobe. Aortic Atherosclerosis (ICD10-I70.0).  04/06/2018- 28 year-old female former smoker followed for COPD, Chronic Respiratory Failure, Lung Nodules, PLMS, complicated by  hypertension, CHF, CAD/MI/ chronic  AFib/ Xarelto O2 2 L/[facility] >? 3L Hospital 7/31- 8/ 6/19 acute on chronic diastolic CHF, acute on chronic hypoxic respiratory failure.   -----COPD mixed type: DME: [facility] Pt is needing to find a light weight- portable O2 with continuous flow. Dis cussed light O2 systems CXR 02/21/2018- Increase in left basilar atelectasis and effusion.  Review of Systems- see HPI + = positive Constitutional:   + weight loss, night sweats, no-Fevers, chills,No- fatigue, lassitude. HEENT:   + headaches,  No-Difficulty swallowing,  Tooth/dental problems, Sore throat,                No-sneezing, itching, ear ache, + nasal congestion, CV:  No chest pain, orthopnea, PND, +swelling in lower extremities, no-anasarca, dizziness, palpitations GI  No heartburn, indigestion, abdominal pain, nausea, vomiting, ,  Resp:   No excess mucus, no productive cough,  + non-productive cough,  No coughing up of blood.    No change in color of mucus.  No wheezing.   Skin: no rash or lesions. GU: . MS:  No joint pain or swelling.   Psych:  No change in mood or affect. No depression +anxiety.  No memory loss.  Objective:   Physical Exam  General- +Alert, Oriented, Affect-appropriate, Distress- none acute        + On O2 2L  Skin- rash-none, lesions- none, excoriation- none Lymphadenopathy- none Head- atraumatic            Eyes- Gross vision  intact, PERRLA, conjunctivae clear secretions            Ears- Hearing, canals normal            Nose-  Clear, No-septal dev, mucus, polyps, erosion, perforation             Throat- Mallampati III , mucosa clear/ dry , drainage- none, tonsils- atrophic.                        Large tongue. Neck- flexible , trachea midline, no stridor , thyroid nl, carotid no bruit Chest - symmetrical excursion , unlabored           Heart/CV- + pulses regular to palpation,Murmur 1/6 S , no gallop  , no rub, nl s1 s2                           - JVD+ , edema+, stasis changes- none, varices- none.           Lung-  Clear to P&A/Diminished/unlabored, wheeze- none , dullness-none, rub- none           Chest wall-  Abd-        + lumbar scoliosis/ juts right hip Br/ Gen/ Rectal- Not done, not indicated Extrem- cyanosis- none, clubbing, none, atrophy- none, strength- nl, + wheelchair Neuro- grossly intact to observation  Assessment & Plan:

## 2018-04-06 NOTE — Patient Instructions (Addendum)
Order- flu vax senior  Sample and print Rx Bevespi   Inhale 2 puffs, twice daily  We will see if we can help find a portable O2 system for continuous 3 L/ min O2  We can keep November appointment  Please call as needed

## 2018-04-13 NOTE — Assessment & Plan Note (Addendum)
We discussed airway component to her dyspnea. Plan-try Bevespi, flu shot

## 2018-04-13 NOTE — Assessment & Plan Note (Signed)
She remains dependent on oxygen and is more dyspneic with fluid retention.  We discussed possibility of getting a light portable oxygen system with continuous flow at 3 L.

## 2018-04-16 ENCOUNTER — Other Ambulatory Visit: Payer: Self-pay

## 2018-04-16 ENCOUNTER — Encounter (HOSPITAL_COMMUNITY): Payer: Self-pay

## 2018-04-16 ENCOUNTER — Emergency Department (HOSPITAL_COMMUNITY): Payer: Medicare Other

## 2018-04-16 ENCOUNTER — Inpatient Hospital Stay (HOSPITAL_COMMUNITY)
Admission: EM | Admit: 2018-04-16 | Discharge: 2018-04-25 | DRG: 291 | Disposition: A | Discharge status: Hospice | Payer: Medicare Other | Attending: Internal Medicine | Admitting: Internal Medicine

## 2018-04-16 DIAGNOSIS — E890 Postprocedural hypothyroidism: Secondary | ICD-10-CM | POA: Diagnosis present

## 2018-04-16 DIAGNOSIS — J9811 Atelectasis: Secondary | ICD-10-CM | POA: Diagnosis present

## 2018-04-16 DIAGNOSIS — E873 Alkalosis: Secondary | ICD-10-CM | POA: Diagnosis not present

## 2018-04-16 DIAGNOSIS — Z6831 Body mass index (BMI) 31.0-31.9, adult: Secondary | ICD-10-CM

## 2018-04-16 DIAGNOSIS — I4819 Other persistent atrial fibrillation: Secondary | ICD-10-CM

## 2018-04-16 DIAGNOSIS — Z7189 Other specified counseling: Secondary | ICD-10-CM

## 2018-04-16 DIAGNOSIS — I9581 Postprocedural hypotension: Secondary | ICD-10-CM | POA: Diagnosis not present

## 2018-04-16 DIAGNOSIS — I509 Heart failure, unspecified: Secondary | ICD-10-CM

## 2018-04-16 DIAGNOSIS — F329 Major depressive disorder, single episode, unspecified: Secondary | ICD-10-CM | POA: Diagnosis present

## 2018-04-16 DIAGNOSIS — Z90722 Acquired absence of ovaries, bilateral: Secondary | ICD-10-CM

## 2018-04-16 DIAGNOSIS — I130 Hypertensive heart and chronic kidney disease with heart failure and stage 1 through stage 4 chronic kidney disease, or unspecified chronic kidney disease: Principal | ICD-10-CM | POA: Diagnosis present

## 2018-04-16 DIAGNOSIS — Z885 Allergy status to narcotic agent status: Secondary | ICD-10-CM

## 2018-04-16 DIAGNOSIS — Z9981 Dependence on supplemental oxygen: Secondary | ICD-10-CM

## 2018-04-16 DIAGNOSIS — R911 Solitary pulmonary nodule: Secondary | ICD-10-CM | POA: Diagnosis present

## 2018-04-16 DIAGNOSIS — G4733 Obstructive sleep apnea (adult) (pediatric): Secondary | ICD-10-CM | POA: Diagnosis present

## 2018-04-16 DIAGNOSIS — J9621 Acute and chronic respiratory failure with hypoxia: Secondary | ICD-10-CM | POA: Diagnosis present

## 2018-04-16 DIAGNOSIS — J9622 Acute and chronic respiratory failure with hypercapnia: Secondary | ICD-10-CM | POA: Diagnosis present

## 2018-04-16 DIAGNOSIS — R54 Age-related physical debility: Secondary | ICD-10-CM | POA: Diagnosis present

## 2018-04-16 DIAGNOSIS — N183 Chronic kidney disease, stage 3 unspecified: Secondary | ICD-10-CM

## 2018-04-16 DIAGNOSIS — Z800 Family history of malignant neoplasm of digestive organs: Secondary | ICD-10-CM

## 2018-04-16 DIAGNOSIS — Z66 Do not resuscitate: Secondary | ICD-10-CM | POA: Diagnosis present

## 2018-04-16 DIAGNOSIS — R040 Epistaxis: Secondary | ICD-10-CM | POA: Diagnosis present

## 2018-04-16 DIAGNOSIS — Z91040 Latex allergy status: Secondary | ICD-10-CM

## 2018-04-16 DIAGNOSIS — I5033 Acute on chronic diastolic (congestive) heart failure: Secondary | ICD-10-CM | POA: Diagnosis present

## 2018-04-16 DIAGNOSIS — I081 Rheumatic disorders of both mitral and tricuspid valves: Secondary | ICD-10-CM | POA: Diagnosis present

## 2018-04-16 DIAGNOSIS — R0602 Shortness of breath: Secondary | ICD-10-CM

## 2018-04-16 DIAGNOSIS — T45515A Adverse effect of anticoagulants, initial encounter: Secondary | ICD-10-CM | POA: Diagnosis present

## 2018-04-16 DIAGNOSIS — Z91048 Other nonmedicinal substance allergy status: Secondary | ICD-10-CM

## 2018-04-16 DIAGNOSIS — Z90710 Acquired absence of both cervix and uterus: Secondary | ICD-10-CM

## 2018-04-16 DIAGNOSIS — E785 Hyperlipidemia, unspecified: Secondary | ICD-10-CM | POA: Diagnosis present

## 2018-04-16 DIAGNOSIS — Z888 Allergy status to other drugs, medicaments and biological substances status: Secondary | ICD-10-CM

## 2018-04-16 DIAGNOSIS — Z7901 Long term (current) use of anticoagulants: Secondary | ICD-10-CM

## 2018-04-16 DIAGNOSIS — N390 Urinary tract infection, site not specified: Secondary | ICD-10-CM | POA: Diagnosis present

## 2018-04-16 DIAGNOSIS — M1990 Unspecified osteoarthritis, unspecified site: Secondary | ICD-10-CM | POA: Diagnosis present

## 2018-04-16 DIAGNOSIS — R001 Bradycardia, unspecified: Secondary | ICD-10-CM | POA: Diagnosis not present

## 2018-04-16 DIAGNOSIS — J9612 Chronic respiratory failure with hypercapnia: Secondary | ICD-10-CM

## 2018-04-16 DIAGNOSIS — G92 Toxic encephalopathy: Secondary | ICD-10-CM | POA: Diagnosis present

## 2018-04-16 DIAGNOSIS — I2729 Other secondary pulmonary hypertension: Secondary | ICD-10-CM | POA: Diagnosis present

## 2018-04-16 DIAGNOSIS — M542 Cervicalgia: Secondary | ICD-10-CM | POA: Diagnosis not present

## 2018-04-16 DIAGNOSIS — J449 Chronic obstructive pulmonary disease, unspecified: Secondary | ICD-10-CM | POA: Diagnosis present

## 2018-04-16 DIAGNOSIS — Z87891 Personal history of nicotine dependence: Secondary | ICD-10-CM

## 2018-04-16 DIAGNOSIS — I2510 Atherosclerotic heart disease of native coronary artery without angina pectoris: Secondary | ICD-10-CM | POA: Diagnosis present

## 2018-04-16 DIAGNOSIS — I480 Paroxysmal atrial fibrillation: Secondary | ICD-10-CM | POA: Diagnosis present

## 2018-04-16 DIAGNOSIS — Z825 Family history of asthma and other chronic lower respiratory diseases: Secondary | ICD-10-CM

## 2018-04-16 DIAGNOSIS — F419 Anxiety disorder, unspecified: Secondary | ICD-10-CM | POA: Diagnosis present

## 2018-04-16 DIAGNOSIS — Z5320 Procedure and treatment not carried out because of patient's decision for unspecified reasons: Secondary | ICD-10-CM | POA: Diagnosis present

## 2018-04-16 DIAGNOSIS — B952 Enterococcus as the cause of diseases classified elsewhere: Secondary | ICD-10-CM | POA: Diagnosis present

## 2018-04-16 DIAGNOSIS — R011 Cardiac murmur, unspecified: Secondary | ICD-10-CM | POA: Diagnosis present

## 2018-04-16 DIAGNOSIS — Z79899 Other long term (current) drug therapy: Secondary | ICD-10-CM

## 2018-04-16 DIAGNOSIS — R319 Hematuria, unspecified: Secondary | ICD-10-CM | POA: Diagnosis not present

## 2018-04-16 DIAGNOSIS — K219 Gastro-esophageal reflux disease without esophagitis: Secondary | ICD-10-CM | POA: Diagnosis present

## 2018-04-16 DIAGNOSIS — X58XXXA Exposure to other specified factors, initial encounter: Secondary | ICD-10-CM | POA: Diagnosis present

## 2018-04-16 DIAGNOSIS — Z951 Presence of aortocoronary bypass graft: Secondary | ICD-10-CM

## 2018-04-16 DIAGNOSIS — Z515 Encounter for palliative care: Secondary | ICD-10-CM

## 2018-04-16 DIAGNOSIS — I493 Ventricular premature depolarization: Secondary | ICD-10-CM | POA: Diagnosis not present

## 2018-04-16 DIAGNOSIS — Z880 Allergy status to penicillin: Secondary | ICD-10-CM

## 2018-04-16 DIAGNOSIS — R627 Adult failure to thrive: Secondary | ICD-10-CM | POA: Diagnosis present

## 2018-04-16 DIAGNOSIS — D539 Nutritional anemia, unspecified: Secondary | ICD-10-CM | POA: Diagnosis present

## 2018-04-16 DIAGNOSIS — Z8711 Personal history of peptic ulcer disease: Secondary | ICD-10-CM

## 2018-04-16 LAB — CBC WITH DIFFERENTIAL/PLATELET
ABS IMMATURE GRANULOCYTES: 0 10*3/uL (ref 0.0–0.1)
BASOS ABS: 0.1 10*3/uL (ref 0.0–0.1)
BASOS PCT: 1 %
Eosinophils Absolute: 0.2 10*3/uL (ref 0.0–0.7)
Eosinophils Relative: 2 %
HCT: 37 % (ref 36.0–46.0)
Hemoglobin: 10.7 g/dL — ABNORMAL LOW (ref 12.0–15.0)
IMMATURE GRANULOCYTES: 0 %
Lymphocytes Relative: 10 %
Lymphs Abs: 0.9 10*3/uL (ref 0.7–4.0)
MCH: 29.2 pg (ref 26.0–34.0)
MCHC: 28.9 g/dL — ABNORMAL LOW (ref 30.0–36.0)
MCV: 101.1 fL — ABNORMAL HIGH (ref 78.0–100.0)
Monocytes Absolute: 1.1 10*3/uL — ABNORMAL HIGH (ref 0.1–1.0)
Monocytes Relative: 12 %
NEUTROS ABS: 6.7 10*3/uL (ref 1.7–7.7)
NEUTROS PCT: 75 %
PLATELETS: 223 10*3/uL (ref 150–400)
RBC: 3.66 MIL/uL — ABNORMAL LOW (ref 3.87–5.11)
RDW: 13.2 % (ref 11.5–15.5)
WBC: 9 10*3/uL (ref 4.0–10.5)

## 2018-04-16 LAB — TROPONIN I
TROPONIN I: 0.03 ng/mL — AB (ref <0.03)
TROPONIN I: 0.03 ng/mL — AB (ref <0.03)

## 2018-04-16 LAB — COMPREHENSIVE METABOLIC PANEL
ALK PHOS: 63 U/L (ref 38–126)
ALT: 11 U/L (ref 0–44)
ANION GAP: 15 (ref 5–15)
AST: 15 U/L (ref 15–41)
Albumin: 3.1 g/dL — ABNORMAL LOW (ref 3.5–5.0)
BILIRUBIN TOTAL: 0.4 mg/dL (ref 0.3–1.2)
BUN: 19 mg/dL (ref 8–23)
CALCIUM: 9.1 mg/dL (ref 8.9–10.3)
CO2: 34 mmol/L — ABNORMAL HIGH (ref 22–32)
Chloride: 93 mmol/L — ABNORMAL LOW (ref 98–111)
Creatinine, Ser: 1.31 mg/dL — ABNORMAL HIGH (ref 0.44–1.00)
GFR calc non Af Amer: 38 mL/min — ABNORMAL LOW (ref >60)
GFR, EST AFRICAN AMERICAN: 44 mL/min — AB (ref >60)
Glucose, Bld: 102 mg/dL — ABNORMAL HIGH (ref 70–99)
Potassium: 3.8 mmol/L (ref 3.5–5.1)
SODIUM: 142 mmol/L (ref 135–145)
TOTAL PROTEIN: 6.6 g/dL (ref 6.5–8.1)

## 2018-04-16 LAB — BRAIN NATRIURETIC PEPTIDE: B Natriuretic Peptide: 745.9 pg/mL — ABNORMAL HIGH (ref 0.0–100.0)

## 2018-04-16 MED ORDER — NITROGLYCERIN 0.4 MG SL SUBL: 0.4000 mg | SUBLINGUAL_TABLET | SUBLINGUAL | Status: DC | PRN

## 2018-04-16 MED ORDER — LORATADINE 10 MG PO TABS
10.0000 mg | ORAL_TABLET | Freq: Every day | ORAL | Status: DC
  Administered 2018-04-17 – 2018-04-24 (×7): 10 mg via ORAL
  Filled 2018-04-16 (×9): qty 1

## 2018-04-16 MED ORDER — CALCIUM CARBONATE-VITAMIN D 500-200 MG-UNIT PO TABS
2.0000 | ORAL_TABLET | Freq: Every day | ORAL | Status: DC
  Administered 2018-04-17 – 2018-04-24 (×7): 2 via ORAL
  Filled 2018-04-16 (×9): qty 2

## 2018-04-16 MED ORDER — OXYMETAZOLINE HCL 0.05 % NA SOLN: 1.0000 | Freq: Two times a day (BID) | NASAL | Status: DC | PRN

## 2018-04-16 MED ORDER — AMIODARONE HCL 200 MG PO TABS
200.0000 mg | ORAL_TABLET | Freq: Every day | ORAL | Status: DC
  Administered 2018-04-16: 200 mg via ORAL
  Filled 2018-04-16 (×2): qty 1

## 2018-04-16 MED ORDER — CLONAZEPAM 0.5 MG PO TABS
0.5000 mg | ORAL_TABLET | Freq: Every day | ORAL | Status: DC
  Administered 2018-04-16 – 2018-04-23 (×7): 0.5 mg via ORAL
  Filled 2018-04-16 (×7): qty 1

## 2018-04-16 MED ORDER — DICLOFENAC SODIUM 1 % TD GEL
2.0000 g | Freq: Four times a day (QID) | TRANSDERMAL | Status: DC
  Administered 2018-04-16 – 2018-04-24 (×21): 2 g via TOPICAL
  Filled 2018-04-16: qty 100

## 2018-04-16 MED ORDER — METOPROLOL TARTRATE 50 MG PO TABS
50.0000 mg | ORAL_TABLET | Freq: Two times a day (BID) | ORAL | Status: DC
  Administered 2018-04-16 – 2018-04-19 (×5): 50 mg via ORAL
  Filled 2018-04-16: qty 2
  Filled 2018-04-16: qty 1
  Filled 2018-04-16: qty 2
  Filled 2018-04-16: qty 1
  Filled 2018-04-16: qty 2

## 2018-04-16 MED ORDER — PANTOPRAZOLE SODIUM 40 MG PO TBEC
40.0000 mg | DELAYED_RELEASE_TABLET | Freq: Every day | ORAL | Status: DC
  Administered 2018-04-17 – 2018-04-24 (×7): 40 mg via ORAL
  Filled 2018-04-16 (×9): qty 1

## 2018-04-16 MED ORDER — BISACODYL 10 MG RE SUPP
10.0000 mg | Freq: Every day | RECTAL | Status: DC | PRN
  Administered 2018-04-21: 10 mg via RECTAL
  Filled 2018-04-16 (×2): qty 1

## 2018-04-16 MED ORDER — DOCUSATE SODIUM 100 MG PO CAPS
200.0000 mg | ORAL_CAPSULE | ORAL | Status: DC | PRN
  Administered 2018-04-16 – 2018-04-18 (×2): 200 mg via ORAL
  Filled 2018-04-16 (×2): qty 2

## 2018-04-16 MED ORDER — IPRATROPIUM BROMIDE 0.02 % IN SOLN: 0.5000 mg | Freq: Four times a day (QID) | RESPIRATORY_TRACT | Status: DC | PRN

## 2018-04-16 MED ORDER — RIVAROXABAN 15 MG PO TABS
15.0000 mg | ORAL_TABLET | Freq: Every day | ORAL | Status: DC
  Administered 2018-04-16 – 2018-04-24 (×8): 15 mg via ORAL
  Filled 2018-04-16 (×8): qty 1

## 2018-04-16 MED ORDER — BENZONATATE 100 MG PO CAPS: 200.0000 mg | ORAL_CAPSULE | Freq: Three times a day (TID) | ORAL | Status: DC | PRN

## 2018-04-16 MED ORDER — METHYLPREDNISOLONE SODIUM SUCC 125 MG IJ SOLR
125.0000 mg | Freq: Once | INTRAMUSCULAR | Status: AC
  Administered 2018-04-16: 125 mg via INTRAVENOUS
  Filled 2018-04-16: qty 2

## 2018-04-16 MED ORDER — FUROSEMIDE 10 MG/ML IJ SOLN
40.0000 mg | Freq: Once | INTRAMUSCULAR | Status: AC
  Administered 2018-04-16: 40 mg via INTRAVENOUS
  Filled 2018-04-16: qty 4

## 2018-04-16 MED ORDER — FUROSEMIDE 10 MG/ML IJ SOLN
40.0000 mg | Freq: Two times a day (BID) | INTRAMUSCULAR | Status: DC
  Administered 2018-04-16 – 2018-04-17 (×2): 40 mg via INTRAVENOUS
  Filled 2018-04-16 (×2): qty 4

## 2018-04-16 MED ORDER — FERROUS GLUCONATE 324 (38 FE) MG PO TABS
324.0000 mg | ORAL_TABLET | Freq: Every day | ORAL | Status: DC
  Administered 2018-04-17 – 2018-04-24 (×7): 324 mg via ORAL
  Filled 2018-04-16 (×9): qty 1

## 2018-04-16 MED ORDER — ISOSORBIDE MONONITRATE ER 60 MG PO TB24
60.0000 mg | ORAL_TABLET | Freq: Every day | ORAL | Status: DC
  Administered 2018-04-16 – 2018-04-24 (×9): 60 mg via ORAL
  Filled 2018-04-16 (×10): qty 1

## 2018-04-16 MED ORDER — SIMVASTATIN 40 MG PO TABS
60.0000 mg | ORAL_TABLET | Freq: Every day | ORAL | Status: DC
  Administered 2018-04-16 – 2018-04-17 (×2): 60 mg via ORAL
  Filled 2018-04-16 (×2): qty 1

## 2018-04-16 MED ORDER — SPIRONOLACTONE 25 MG PO TABS
25.0000 mg | ORAL_TABLET | Freq: Every day | ORAL | Status: DC
  Administered 2018-04-16 – 2018-04-24 (×9): 25 mg via ORAL
  Filled 2018-04-16 (×10): qty 1

## 2018-04-16 MED ORDER — BUTALBITAL-APAP-CAFFEINE 50-325-40 MG PO TABS
1.0000 | ORAL_TABLET | Freq: Four times a day (QID) | ORAL | Status: DC | PRN
  Administered 2018-04-17 – 2018-04-18 (×2): 1 via ORAL
  Filled 2018-04-16 (×3): qty 1

## 2018-04-16 MED ORDER — POTASSIUM CHLORIDE CRYS ER 20 MEQ PO TBCR
40.0000 meq | EXTENDED_RELEASE_TABLET | Freq: Two times a day (BID) | ORAL | Status: DC
  Administered 2018-04-16 – 2018-04-17 (×3): 40 meq via ORAL
  Filled 2018-04-16 (×3): qty 2

## 2018-04-16 NOTE — ED Notes (Signed)
Troponin noted to provider

## 2018-04-16 NOTE — ED Triage Notes (Signed)
Pt arrives EMS from home with c/o shob and weakness. Pt state snose bleed on Saturday that ":wore her out".  Pt has hx of afib and chf

## 2018-04-16 NOTE — H&P (Signed)
History and Physical    DOA: 04/16/2018  PCP: Maurice Small, MD  Patient coming from:   Chief Complaint: Progressive dyspnea x 1 month  HPI: [name] is a 77 y.o. female with history h/o chronic diastolic CHF with chronic respiratory failure requiring home O2 2 L to 3 L at baseline, paroxysmal atrial fibrillation on chronic anticoagulation with Xarelto, COPD, anxiety disorder and CAD presents with complaints of progressive dyspnea for the last 1 month.  Patient at her best is able to walk with a walker at about 30 feet in her home without feeling short of breath.  Over the last month her exercise tolerance has reduced and over the last 2 days she has had dyspnea at rest as well.  She states she has had trouble falling asleep at night for the longest time and usually is able to rest only for 4 to 5 hours after taking Klonopin.  She did undergo sleep study recently and was ruled out for sleep apnea.  She does report some chest tightness associated with shortness of breath since yesterday.  Work-up in the ED showed BNP at 745 (was 300 before) and chest x-ray with small pleural effusions.  She has received 40 mg of IV Lasix and Solu-Medrol 125 mg in the ED.  She is requested to be admitted for further evaluation and management.  She is currently saturating well at 3L nasal cannula but appears dyspneic while talking full sentences and providing history.  Daughter who is at bedside today reports that patient had 2 episodes of severe epistaxis over the last week, most recently on Saturday when she passed blood clots and daughter was able to help control the bleeding by packing the nose with wet tissue.  Patient states she has had recurrent episodes of epistaxis on Xarelto over the last 2 years but are usually mild and she does use humidifier at baseline.  She denies any chest pain currently.  She does have some leg swellings but denies any worsening.  She was admitted twice in July for CHF  exacerbation.   Review of Systems: As per HPI otherwise 10 point review of systems negative.    Past Medical History:  Diagnosis Date  . Acute parotitis 01/05/2016  . Anxiety   . Bacteremia, escherichia coli   . Bursitis, shoulder    bilateral  . CHF (congestive heart failure) (HCC)    chronic diasotlic CHF  . Complication of anesthesia    "have trouble getting me awake sometimes; been ok latelhy" (09/24/2015)  . COPD (chronic obstructive pulmonary disease) (HCC)   . Coronary heart disease 2001   s/p CABG  . Depression   . DJD (degenerative joint disease)   . Dyspnea   . GERD (gastroesophageal reflux disease)   . H/O hiatal hernia   . History of rheumatic heart disease    X 3-LAST TIME WHEN PATIENT WAS 77 YRS OLD  . Hyperlipidemia   . Hypertension   . OA (osteoarthritis)   . On home oxygen therapy    "2L; 24/7" (09/24/2015)  . PUD (peptic ulcer disease)   . Pulmonary HTN (HCC)    PASP 50-75mmHg by [id number]  . Sleep apnea    intolerant to CPAP  . Thyroid nodule   . Urinary incontinence   . Urine incontinence   . Varicose veins     Past Surgical History:  Procedure Laterality Date  . BILATERAL SALPINGOOPHORECTOMY    . CARDIAC CATHETERIZATION  02/11/2008, 01/30/2004, 06/14/2000  . CARDIAC  CATHETERIZATION N/A 09/24/2015   Procedure: Right Heart Cath;  Surgeon: Laurey Morale, MD;  Location: [facility] INVASIVE CV LAB;  Service: Cardiovascular;  Laterality: N/A;  . CARDIAC CATHETERIZATION N/A 02/08/2016   Procedure: Right/Left Heart Cath and Coronary/Graft Angiography;  Surgeon: Laurey Morale, MD;  Location: [facility] INVASIVE CV LAB;  Service: Cardiovascular;  Laterality: N/A;  . CARDIOVERSION N/A 06/12/2016   Procedure: CARDIOVERSION;  Surgeon: Duke Salvia, MD;  Location: [facility] OR;  Service: Cardiovascular;  Laterality: N/A;  . CORONARY ARTERY BYPASS GRAFT  06/21/2000   x4  . INCONTINENCE SURGERY     "tacked"  . RIGHT HEART CATH N/A 02/26/2018   Procedure: RIGHT HEART CATH;  Surgeon:  Laurey Morale, MD;  Location: [facility] INVASIVE CV LAB;  Service: Cardiovascular;  Laterality: N/A;  . TEE WITHOUT CARDIOVERSION N/A 09/24/2015   Procedure: TRANSESOPHAGEAL ECHOCARDIOGRAM (TEE);  Surgeon: Laurey Morale, MD;  Location: [facility] ENDOSCOPY;  Service: Cardiovascular;  Laterality: N/A;  . THYROIDECTOMY, PARTIAL  2010   Dr Michaell Cowing  . TOTAL ABDOMINAL HYSTERECTOMY  1990   1990  . TUBAL LIGATION    . VAGINAL DELIVERY  x4    Social history:  reports that she quit smoking about 10 years ago. Her smoking use included cigarettes. She has a 47.00 pack-year smoking history. She has never used smokeless tobacco. She reports that she does not drink alcohol or use drugs.   Allergies  Allergen Reactions  . Levalbuterol Other (See Comments)    Made mouth and throat sore Made mouth and throat sore  . Lisinopril Cough    Developed persistent dry cough for a month.   . Tape Other (See Comments)    Bleeding  . Cefpodoxime Other (See Comments)    Yeast infections   . Codeine Other (See Comments)    Anxious/ jittery  . Lipitor [Atorvastatin] Other (See Comments)    Made joint start hurting/ Muscle aches  . Other Swelling    Venholin HFA  . Penicillins Other (See Comments)    childhood reaction Has patient had a PCN reaction causing immediate rash, facial/tongue/throat swelling, SOB or lightheadedness with hypotension: unknown Has patient had a PCN reaction causing severe rash involving mucus membranes or skin necrosis: No Has patient had a PCN reaction that required hospitalization No Has patient had a PCN reaction occurring within the last 10 years: No If all of the above answers are "NO", then may proceed with Cephalosporin use.   Terald Sleeper [Kdc:Albuterol] Other (See Comments)    "jittery"  . Celexa [Citalopram Hydrobromide]   . Lyrica [Pregabalin]   . Anoro Ellipta [Umeclidinium-Vilanterol] Itching  . Clindamycin Other (See Comments)    Unknown   . Hydrocodone-Acetaminophen Anxiety  .  Latex Itching and Rash    Family History  Problem Relation Age of Onset  . Anemia Mother   . Emphysema Father   . Esophageal cancer Daughter       Prior to Admission medications   Medication Sig Start Date End Date Taking? Authorizing Provider  acetaminophen (TYLENOL) 500 MG tablet Take 1,000 mg by mouth 2 (two) times daily as needed for headache.    Yes [provider]  amiodarone (PACERONE) 200 MG tablet Take 1 tablet (200 mg total) by mouth daily. 03/21/18  Yes Bensimhon, Bevelyn Buckles, MD  calcium-vitamin D (OSCAL WITH D) 500-200 MG-UNIT per tablet Take 2 tablets by mouth daily.    Yes [provider]  clonazePAM (KLONOPIN) 0.5 MG tablet Take 1 tablet (0.5  mg total) by mouth at bedtime. 02/01/18  Yes Meredeth Ide, MD  diclofenac sodium (VOLTAREN) 1 % GEL Apply 2 g topically daily as needed (pain). 02/01/18  Yes Meredeth Ide, MD  docusate sodium (COLACE) 100 MG capsule Take 200 mg by mouth as needed for mild constipation.   Yes [provider]  ferrous gluconate (FERGON) 324 MG tablet Take 324 mg by mouth daily.   Yes [provider]  ipratropium (ATROVENT) 0.02 % nebulizer solution INHALE THE CONTENTS OF 1  VIAL VIA NEBULIZER 4 TIMES  DAILY Patient taking differently: Take 0.5 mg by nebulization every 6 (six) hours as needed for wheezing or shortness of breath.  03/06/18  Yes Michele Mcalpine, MD  isosorbide mononitrate (IMDUR) 60 MG 24 hr tablet Take 60 mg by mouth daily.    Yes [provider]  loratadine (CLARITIN) 10 MG tablet Take 10 mg by mouth daily.    Yes [provider]  metoprolol tartrate (LOPRESSOR) 50 MG tablet Take 1 tablet (50 mg total) by mouth 2 (two) times daily. 09/01/17  Yes Laurey Morale, MD  nitroGLYCERIN (NITROSTAT) 0.4 MG SL tablet place 1 tablet under the tongue if needed every 5 minutes for chest pain for 3 doses IF NO RELIEF AFTER 3RD DOSE CALL PRESCRIBER OR 911. 03/22/18  Yes Graciella Freer, PA-C   omeprazole (PRILOSEC) 40 MG capsule Take 40 mg by mouth daily. 11/22/17  Yes [provider]  OXYGEN Inhale 2-3 L into the lungs continuous. At rest 2 liters  Moving around 3 liters   Yes [provider]  potassium chloride SA (K-DUR,KLOR-CON) 20 MEQ tablet Take 2 tablets (40 mEq total) by mouth 2 (two) times daily. 03/30/18  Yes Laurey Morale, MD  Propylene Glycol (SYSTANE BALANCE) 0.6 % SOLN Apply 1 drop to eye as needed (sensitivity).   Yes [provider]  Rivaroxaban (XARELTO) 15 MG TABS tablet Take 1 tablet (15 mg total) by mouth daily with supper. 10/26/17  Yes Laurey Morale, MD  simvastatin (ZOCOR) 20 MG tablet TAKE 3 TABLETS BY MOUTH  DAILY AT 6 PM Patient taking differently: Take 60 mg by mouth daily at 6 PM.  05/30/17  Yes Laurey Morale, MD  spironolactone (ALDACTONE) 25 MG tablet TAKE 1 TABLET BY MOUTH  DAILY 05/30/17  Yes Laurey Morale, MD  torsemide (DEMADEX) 20 MG tablet Take 4 tablets (80 mg total) by mouth 2 (two) times daily. 02/12/18  Yes Laurey Morale, MD  benzonatate (TESSALON) 200 MG capsule Take 1 capsule (200 mg total) by mouth 3 (three) times daily as needed for cough. Patient not taking: Reported on 04/16/2018 08/26/16   Tonye Becket D, NP  Glycopyrrolate-Formoterol (BEVESPI AEROSPHERE) 9-4.8 MCG/ACT AERO Inhale 2 puffs into the lungs 2 (two) times daily. Patient not taking: Reported on 04/16/2018 04/06/18   Waymon Budge, MD  Glycopyrrolate-Formoterol (BEVESPI AEROSPHERE) 9-4.8 MCG/ACT AERO Inhale 2 puffs into the lungs 2 (two) times daily. Patient not taking: Reported on 04/16/2018 04/06/18   Waymon Budge, MD    Physical Exam: Vitals:   04/16/18 1401 04/16/18 1405 04/16/18 1438 04/16/18 1439  BP:   (!) 107/40   Pulse: (!) 102 (!) 103 85 (!) 53  Resp: 16 (!) 30  (!) 26  Temp:      TempSrc:      SpO2: 96% 96% 95% 95%  Weight:      Height:        Constitutional:  NAD, calm, comfortable Vitals:   04/16/18 1401 04/16/18 1405  04/16/18 1438 04/16/18 1439  BP:   (!) 107/40   Pulse: (!) 102 (!) 103 85 (!) 53  Resp: 16 (!) 30  (!) 26  Temp:      TempSrc:      SpO2: 96% 96% 95% 95%  Weight:      Height:       Eyes: PERRL, lids and conjunctivae normal.  ENMT: Mucous membranes are moist. Posterior pharynx clear of any exudate or lesions.Normal dentition.  Nasal cannula in place Neck: normal, supple, no masses, no thyromegaly Respiratory: Clear to auscultation bilaterally, no wheezing, no crackles. Normal respiratory effort. No accessory muscle use.  Cardiovascular: regular rate and rhythm, no murmurs / rubs / gallops.  1+ pitting extremity edema. 2+ pedal pulses. No carotid bruits.  Abdomen: no tenderness, no masses palpated. No hepatosplenomegaly. Bowel sounds positive.  Musculoskeletal: no clubbing / cyanosis. No joint deformity upper and lower extremities. Good ROM, no contractures. Normal muscle tone.  Neurologic: CN 2-12 grossly intact. Sensation intact, DTR normal. Strength 5/5 in all 4.  Psychiatric: Normal judgment and insight. Alert and oriented x 3. Normal mood.  SKIN/catheters: no rashes, lesions, ulcers. No induration  Labs on Admission: I have personally reviewed following labs and imaging studies  CBC: Recent Labs  Lab 04/16/18 1039  WBC 9.0  NEUTROABS 6.7  HGB 10.7*  HCT 37.0  MCV 101.1*  PLT 223   Basic Metabolic Panel: Recent Labs  Lab 04/16/18 1039  NA 142  K 3.8  CL 93*  CO2 34*  GLUCOSE 102*  BUN 19  CREATININE 1.31*  CALCIUM 9.1   GFR: Estimated Creatinine Clearance: 39.7 mL/min (A) (by C-G formula based on SCr of 1.31 mg/dL (H)). Liver Function Tests: Recent Labs  Lab 04/16/18 1039  AST 15  ALT 11  ALKPHOS 63  BILITOT 0.4  PROT 6.6  ALBUMIN 3.1*   No results for input(s): LIPASE, AMYLASE in the last 168 hours. No results for input(s): AMMONIA in the last 168 hours. Coagulation Profile: No results for input(s): INR, PROTIME in the last 168 hours. Cardiac  Enzymes: Recent Labs  Lab 04/16/18 1039  TROPONINI 0.03*   BNP (last 3 results) No results for input(s): PROBNP in the last 8760 hours. HbA1C: No results for input(s): HGBA1C in the last 72 hours. CBG: No results for input(s): GLUCAP in the last 168 hours. Lipid Profile: No results for input(s): CHOL, HDL, LDLCALC, TRIG, CHOLHDL, LDLDIRECT in the last 72 hours. Thyroid Function Tests: No results for input(s): TSH, T4TOTAL, FREET4, T3FREE, THYROIDAB in the last 72 hours. Anemia Panel: No results for input(s): VITAMINB12, FOLATE, FERRITIN, TIBC, IRON, RETICCTPCT in the last 72 hours. Urine analysis:    Component Value Date/Time   COLORURINE STRAW (A) 02/21/2018 2038   APPEARANCEUR CLEAR 02/21/2018 2038   LABSPEC 1.006 02/21/2018 2038   PHURINE 6.0 02/21/2018 2038   GLUCOSEU NEGATIVE 02/21/2018 2038   HGBUR NEGATIVE 02/21/2018 2038   BILIRUBINUR NEGATIVE 02/21/2018 2038   KETONESUR NEGATIVE 02/21/2018 2038   PROTEINUR NEGATIVE 02/21/2018 2038   NITRITE NEGATIVE 02/21/2018 2038   LEUKOCYTESUR NEGATIVE 02/21/2018 2038    Radiological Exams on Admission: Dg Chest 2 View  Result Date: 04/16/2018 CLINICAL DATA:  Shortness of breath.  Atrial fibrillation EXAM: CHEST - 2 VIEW COMPARISON:  February 23, 2018 FINDINGS: There is fibrosis in the lung bases, stable. There is a small pleural effusion on the left with left base atelectasis. The  lungs elsewhere are clear. Heart is mildly enlarged with pulmonary vascularity normal. Patient is status post coronary artery bypass grafting. There is aortic atherosclerosis. No evident bone lesions. There is anterior wedging of lower thoracic vertebral bodies. IMPRESSION: Small left pleural effusion with left base atelectasis. Underlying bibasilar fibrosis. No frank edema or consolidation. Stable cardiac prominence. Postoperative changes are noted.  There is aortic atherosclerosis. Aortic Atherosclerosis (ICD10-I70.0). Electronically Signed   By: Bretta Bang III M.D.   On: 04/16/2018 10:28    EKG: Independently reviewed.  No acute ST-T changes     Assessment and Plan:   1.  Acute diastolic CHF exacerbation: Continue diuresis.  Check daily weights, input and outputs.  Given complaints of chest tightness, will cycle cardiac enzymes.  Most recent echo in July showed EF 60 to 65%.  2.  COPD: Patient does not seem to have any wheezing currently.  Favor CHF exacerbation over COPD at this point given clinical presentation.  She did receive 1 dose of IV Solu-Medrol in the ED.  Patient has intolerance to albuterol inhalers, will resume Atrovent nebulizations as needed.  3.  Paroxysmal atrial fibrillation: Resume home medications including anticoagulation with Xarelto.  Watch for epistaxis recurrence.  Will have Afrin spray available.  Humidified O2.  Hemoglobin stable.  4.  Anxiety/depression: Resume home medications  DVT prophylaxis: On anticoagulation  Code Status: DNR confirmed by patient and family  Family Communication: Discussed with patient. Health care proxy would be son Mariah Gerstenberger Consults called: None Admission status:  Patient admitted as observation as anticipated LOS less than 2 midnights    Alessandra Bevels MD [facility] Pager [phone]  If 7PM-7AM, please contact night-coverage [email or web address] Password [facility]  04/16/2018, 3:07 PM

## 2018-04-16 NOTE — ED Provider Notes (Signed)
[facility] EMERGENCY DEPARTMENT Provider Note   CSN: [id number] Arrival date & time: 04/16/18  5621     History   Chief Complaint Chief Complaint  Patient presents with  . Shortness of Breath  . Weakness    HPI [name] is a 77 y.o. female.  HPI Patient presents with concern of ongoing weakness, and chest pain. When asked when her pain begins she states that she always has some degree of pain. She knowledges multiple medical issues including CHF, COPD.  When she states that she takes all medication as directed, and is on oxygen 24/7. No clear precipitant for this worsening weakness, but it seems as over the past day or so she has had increased weakness. Weakness and chest pain and dyspnea all are worse with exertion. No new fever, no vomiting. Pain is focally in the left upper chest, sore, tight, ongoing. Past Medical History:  Diagnosis Date  . Acute parotitis 01/05/2016  . Anxiety   . Bacteremia, escherichia coli   . Bursitis, shoulder    bilateral  . CHF (congestive heart failure) (HCC)    chronic diasotlic CHF  . Complication of anesthesia    "have trouble getting me awake sometimes; been ok latelhy" (09/24/2015)  . COPD (chronic obstructive pulmonary disease) (HCC)   . Coronary heart disease 2001   s/p CABG  . Depression   . DJD (degenerative joint disease)   . Dyspnea   . GERD (gastroesophageal reflux disease)   . H/O hiatal hernia   . History of rheumatic heart disease    X 3-LAST TIME WHEN PATIENT WAS 77 YRS OLD  . Hyperlipidemia   . Hypertension   . OA (osteoarthritis)   . On home oxygen therapy    "2L; 24/7" (09/24/2015)  . PUD (peptic ulcer disease)   . Pulmonary HTN (HCC)    PASP 50-38mmHg by [id number]  . Sleep apnea    intolerant to CPAP  . Thyroid nodule   . Urinary incontinence   . Urine incontinence   . Varicose veins     Patient Active Problem List   Diagnosis Date Noted  . Normocytic anemia 01/25/2018  .  Metabolic alkalosis 12/13/2017  . Gait disturbance 11/21/2017  . Lung nodule 11/07/2016  . Acute on chronic diastolic CHF (congestive heart failure) (HCC)   . Mitral valve stenosis   . Rheumatic aortic valve disease   . Chronic respiratory failure with hypoxia (HCC) 04/20/2016  . Rheumatic mitral valve disease   . PFO (patent foramen ovale)   . Cardiomyopathy (HCC)   . Chronic atrial fibrillation (HCC)   . Alcoholic cardiomyopathy (HCC)   . Aortic valve vegetation 09/24/2015  . Lumbar spine scoliosis 04/18/2015  . Mitral stenosis 02/10/2015  . Chronic diastolic heart failure (HCC) 05/10/2013  . Chronic anticoagulation 05/10/2013  . HTN (hypertension) 05/10/2013  . Dyslipidemia, goal LDL below 70 05/10/2013  . Seasonal allergic rhinitis 04/11/2012  . DYSPNEA 03/14/2008  . Coronary atherosclerosis 02/27/2008  . COPD mixed type (HCC) 02/27/2008  . Pulmonary hypertension (HCC) 02/21/2008    Past Surgical History:  Procedure Laterality Date  . BILATERAL SALPINGOOPHORECTOMY    . CARDIAC CATHETERIZATION  02/11/2008, 01/30/2004, 06/14/2000  . CARDIAC CATHETERIZATION N/A 09/24/2015   Procedure: Right Heart Cath;  Surgeon: Laurey Morale, MD;  Location: [facility] INVASIVE CV LAB;  Service: Cardiovascular;  Laterality: N/A;  . CARDIAC CATHETERIZATION N/A 02/08/2016   Procedure: Right/Left Heart Cath and Coronary/Graft Angiography;  Surgeon: Laurey Morale, MD;  Location: [facility] INVASIVE CV LAB;  Service: Cardiovascular;  Laterality: N/A;  . CARDIOVERSION N/A 06/12/2016   Procedure: CARDIOVERSION;  Surgeon: Duke Salvia, MD;  Location: [facility] OR;  Service: Cardiovascular;  Laterality: N/A;  . CORONARY ARTERY BYPASS GRAFT  06/21/2000   x4  . INCONTINENCE SURGERY     "tacked"  . RIGHT HEART CATH N/A 02/26/2018   Procedure: RIGHT HEART CATH;  Surgeon: Laurey Morale, MD;  Location: [facility] INVASIVE CV LAB;  Service: Cardiovascular;  Laterality: N/A;  . TEE WITHOUT CARDIOVERSION N/A 09/24/2015   Procedure:  TRANSESOPHAGEAL ECHOCARDIOGRAM (TEE);  Surgeon: Laurey Morale, MD;  Location: [facility] ENDOSCOPY;  Service: Cardiovascular;  Laterality: N/A;  . THYROIDECTOMY, PARTIAL  2010   Dr Michaell Cowing  . TOTAL ABDOMINAL HYSTERECTOMY  1990   1990  . TUBAL LIGATION    . VAGINAL DELIVERY  x4     OB History    Gravida  4   Para  4   Term      Preterm      AB      Living        SAB      TAB      Ectopic      Multiple      Live Births               Home Medications    Prior to Admission medications   Medication Sig Start Date End Date Taking? Authorizing Provider  acetaminophen (TYLENOL) 500 MG tablet Take 1,000 mg by mouth 2 (two) times daily as needed for headache.    Yes [provider]  amiodarone (PACERONE) 200 MG tablet Take 1 tablet (200 mg total) by mouth daily. 03/21/18  Yes Bensimhon, Bevelyn Buckles, MD  calcium-vitamin D (OSCAL WITH D) 500-200 MG-UNIT per tablet Take 2 tablets by mouth daily.    Yes [provider]  clonazePAM (KLONOPIN) 0.5 MG tablet Take 1 tablet (0.5 mg total) by mouth at bedtime. 02/01/18  Yes Meredeth Ide, MD  diclofenac sodium (VOLTAREN) 1 % GEL Apply 2 g topically daily as needed (pain). 02/01/18  Yes Meredeth Ide, MD  docusate sodium (COLACE) 100 MG capsule Take 200 mg by mouth as needed for mild constipation.   Yes [provider]  ferrous gluconate (FERGON) 324 MG tablet Take 324 mg by mouth daily.   Yes [provider]  ipratropium (ATROVENT) 0.02 % nebulizer solution INHALE THE CONTENTS OF 1  VIAL VIA NEBULIZER 4 TIMES  DAILY 03/06/18  Yes Michele Mcalpine, MD  loratadine (CLARITIN) 10 MG tablet Take 10 mg by mouth daily.    Yes [provider]  metoprolol tartrate (LOPRESSOR) 50 MG tablet Take 1 tablet (50 mg total) by mouth 2 (two) times daily. 09/01/17  Yes Laurey Morale, MD  nitroGLYCERIN (NITROSTAT) 0.4 MG SL tablet place 1 tablet under the tongue if needed every 5 minutes for chest pain for 3 doses IF NO  RELIEF AFTER 3RD DOSE CALL PRESCRIBER OR 911. 03/22/18  Yes Graciella Freer, PA-C  OXYGEN Inhale 2-3 L into the lungs continuous. At rest 2 liters  Moving around 3 liters   Yes [provider]  potassium chloride SA (K-DUR,KLOR-CON) 20 MEQ tablet Take 2 tablets (40 mEq total) by mouth 2 (two) times daily. 03/30/18  Yes Laurey Morale, MD  Rivaroxaban (XARELTO) 15 MG TABS tablet Take 1 tablet (15 mg total) by mouth daily with supper. 10/26/17  Yes Marca Ancona  S, MD  simvastatin (ZOCOR) 20 MG tablet TAKE 3 TABLETS BY MOUTH  DAILY AT 6 PM Patient taking differently: Take 60 mg by mouth daily at 6 PM.  05/30/17  Yes Laurey Morale, MD  spironolactone (ALDACTONE) 25 MG tablet TAKE 1 TABLET BY MOUTH  DAILY Patient taking differently: TAKE 25MG  BY MOUTH  DAILY 05/30/17  Yes Laurey Morale, MD  torsemide (DEMADEX) 20 MG tablet Take 4 tablets (80 mg total) by mouth 2 (two) times daily. 02/12/18  Yes Laurey Morale, MD  benzonatate (TESSALON) 200 MG capsule Take 1 capsule (200 mg total) by mouth 3 (three) times daily as needed for cough. 08/26/16   Clegg, Amy D, NP  Glycopyrrolate-Formoterol (BEVESPI AEROSPHERE) 9-4.8 MCG/ACT AERO Inhale 2 puffs into the lungs 2 (two) times daily. 04/06/18   Waymon Budge, MD  Glycopyrrolate-Formoterol (BEVESPI AEROSPHERE) 9-4.8 MCG/ACT AERO Inhale 2 puffs into the lungs 2 (two) times daily. 04/06/18   Jetty Duhamel D, MD  isosorbide mononitrate (IMDUR) 30 MG 24 hr tablet Take 90 mg by mouth daily.    [provider]  omeprazole (PRILOSEC) 40 MG capsule Take 40 mg by mouth daily. 11/22/17   [provider]    Family History Family History  Problem Relation Age of Onset  . Anemia Mother   . Emphysema Father   . Esophageal cancer Daughter     Social History Social History   Tobacco Use  . Smoking status: Former Smoker    Packs/day: 1.00    Years: 47.00    Pack years: 47.00    Types: Cigarettes    Last attempt to quit:  07/26/2007    Years since quitting: 10.7  . Smokeless tobacco: Never Used  Substance Use Topics  . Alcohol use: No  . Drug use: No     Allergies   Levalbuterol; Lisinopril; Tape; Cefpodoxime; Codeine; Lipitor [atorvastatin]; Other; Penicillins; Ventolin [kdc:albuterol]; Celexa [citalopram hydrobromide]; Lyrica [pregabalin]; Anoro ellipta [umeclidinium-vilanterol]; Clindamycin; Hydrocodone-acetaminophen; and Latex   Review of Systems Review of Systems  Constitutional:       Per HPI, otherwise negative  HENT:       Per HPI, otherwise negative  Respiratory:       Per HPI, otherwise negative  Cardiovascular:       Per HPI, otherwise negative  Gastrointestinal: Negative for vomiting.  Endocrine:       Negative aside from HPI  Genitourinary:       Neg aside from HPI   Musculoskeletal:       Per HPI, otherwise negative  Skin: Negative.   Neurological: Positive for weakness. Negative for syncope.     Physical Exam Updated Vital Signs BP 105/75   Pulse (!) 113   Temp 98.2 F (36.8 C) (Oral)   Resp (!) 33   Ht 5\' 6"  (1.676 m)   Wt 88.5 kg   SpO2 98%   BMI 31.47 kg/m   Physical Exam  Constitutional: She is oriented to person, place, and time. She appears well-developed and well-nourished. She appears ill.  HENT:  Head: Normocephalic and atraumatic.  Eyes: Conjunctivae and EOM are normal.  Cardiovascular: Normal rate and regular rhythm.  Pulmonary/Chest: Tachypnea noted. She has decreased breath sounds.  3 L nasal cannula in place with oxygen saturation 95%  Abdominal: She exhibits no distension.  Musculoskeletal: She exhibits no edema.  Neurological: She is alert and oriented to person, place, and time. No cranial nerve deficit.  Skin: Skin is warm and dry.  Psychiatric: She has a normal mood and affect.  Nursing note and vitals reviewed.    ED Treatments / Results  Labs (all labs ordered are listed, but only abnormal results are displayed) Labs Reviewed    COMPREHENSIVE METABOLIC PANEL - Abnormal; Notable for the following components:      Result Value   Chloride 93 (*)    CO2 34 (*)    Glucose, Bld 102 (*)    Creatinine, Ser 1.31 (*)    Albumin 3.1 (*)    GFR calc non Af Amer 38 (*)    GFR calc Af Amer 44 (*)    All other components within normal limits  CBC WITH DIFFERENTIAL/PLATELET - Abnormal; Notable for the following components:   RBC 3.66 (*)    Hemoglobin 10.7 (*)    MCV 101.1 (*)    MCHC 28.9 (*)    Monocytes Absolute 1.1 (*)    All other components within normal limits  TROPONIN I - Abnormal; Notable for the following components:   Troponin I 0.03 (*)    All other components within normal limits  BRAIN NATRIURETIC PEPTIDE - Abnormal; Notable for the following components:   B Natriuretic Peptide 745.9 (*)    All other components within normal limits    EKG EKG Interpretation  Date/Time:  Monday April 16 2018 09:19:32 EDT Ventricular Rate:  104 PR Interval:    QRS Duration: 162 QT Interval:  411 QTC Calculation: 541 R Axis:   -89 Text Interpretation:  Atrial fibrillation Right bundle branch block Probable anterior infarct, age indeterminate Abnormal ekg Confirmed by Gerhard Munch [phone]) on 04/16/2018 9:24:11 AM   Radiology Dg Chest 2 View  Result Date: 04/16/2018 CLINICAL DATA:  Shortness of breath.  Atrial fibrillation EXAM: CHEST - 2 VIEW COMPARISON:  February 23, 2018 FINDINGS: There is fibrosis in the lung bases, stable. There is a small pleural effusion on the left with left base atelectasis. The lungs elsewhere are clear. Heart is mildly enlarged with pulmonary vascularity normal. Patient is status post coronary artery bypass grafting. There is aortic atherosclerosis. No evident bone lesions. There is anterior wedging of lower thoracic vertebral bodies. IMPRESSION: Small left pleural effusion with left base atelectasis. Underlying bibasilar fibrosis. No frank edema or consolidation. Stable cardiac prominence.  Postoperative changes are noted.  There is aortic atherosclerosis. Aortic Atherosclerosis (ICD10-I70.0). Electronically Signed   By: Bretta Bang III M.D.   On: 04/16/2018 10:28    Procedures Procedures (including critical care time)  Medications Ordered in ED Medications  furosemide (LASIX) injection 40 mg (has no administration in time range)  methylPREDNISolone sodium succinate (SOLU-MEDROL) 125 mg/2 mL injection 125 mg (has no administration in time range)     Initial Impression / Assessment and Plan / ED Course  I have reviewed the triage vital signs and the nursing notes.  Pertinent labs & imaging results that were available during my care of the patient were reviewed by me and considered in my medical decision making (see chart for details).     12:45 PM Patient in similar condition, requiring supplemental oxygen for appropriate saturation. Labs notable for elevated BNP greater than 700. X-ray does not demonstrate obvious consolidation, and the patient is afebrile. However, there is concern for COPD and CO2 HF exacerbation given her clear lung sounds, though diminished, increased respiratory effort, and chest tightness. Patient has received steroids, IV Lasix, will require admission for further evaluation and management.   Final Clinical Impressions(s) / ED Diagnoses  Shortness of breath   Gerhard Munch, MD 04/16/18 1246

## 2018-04-16 NOTE — ED Notes (Signed)
Pt has noisy respirations but consistently states no pain. Daughter at bedside

## 2018-04-17 DIAGNOSIS — I480 Paroxysmal atrial fibrillation: Secondary | ICD-10-CM | POA: Diagnosis present

## 2018-04-17 DIAGNOSIS — J9811 Atelectasis: Secondary | ICD-10-CM | POA: Diagnosis present

## 2018-04-17 DIAGNOSIS — Z951 Presence of aortocoronary bypass graft: Secondary | ICD-10-CM | POA: Diagnosis not present

## 2018-04-17 DIAGNOSIS — Z880 Allergy status to penicillin: Secondary | ICD-10-CM | POA: Diagnosis not present

## 2018-04-17 DIAGNOSIS — I081 Rheumatic disorders of both mitral and tricuspid valves: Secondary | ICD-10-CM | POA: Diagnosis present

## 2018-04-17 DIAGNOSIS — Z7189 Other specified counseling: Secondary | ICD-10-CM | POA: Diagnosis not present

## 2018-04-17 DIAGNOSIS — X58XXXA Exposure to other specified factors, initial encounter: Secondary | ICD-10-CM | POA: Diagnosis present

## 2018-04-17 DIAGNOSIS — J9612 Chronic respiratory failure with hypercapnia: Secondary | ICD-10-CM | POA: Diagnosis not present

## 2018-04-17 DIAGNOSIS — I5033 Acute on chronic diastolic (congestive) heart failure: Secondary | ICD-10-CM | POA: Diagnosis present

## 2018-04-17 DIAGNOSIS — B952 Enterococcus as the cause of diseases classified elsewhere: Secondary | ICD-10-CM | POA: Diagnosis present

## 2018-04-17 DIAGNOSIS — J9621 Acute and chronic respiratory failure with hypoxia: Secondary | ICD-10-CM

## 2018-04-17 DIAGNOSIS — Z885 Allergy status to narcotic agent status: Secondary | ICD-10-CM | POA: Diagnosis not present

## 2018-04-17 DIAGNOSIS — I2510 Atherosclerotic heart disease of native coronary artery without angina pectoris: Secondary | ICD-10-CM | POA: Diagnosis present

## 2018-04-17 DIAGNOSIS — Z515 Encounter for palliative care: Secondary | ICD-10-CM | POA: Diagnosis not present

## 2018-04-17 DIAGNOSIS — G92 Toxic encephalopathy: Secondary | ICD-10-CM | POA: Diagnosis present

## 2018-04-17 DIAGNOSIS — I130 Hypertensive heart and chronic kidney disease with heart failure and stage 1 through stage 4 chronic kidney disease, or unspecified chronic kidney disease: Secondary | ICD-10-CM | POA: Diagnosis present

## 2018-04-17 DIAGNOSIS — Z888 Allergy status to other drugs, medicaments and biological substances status: Secondary | ICD-10-CM | POA: Diagnosis not present

## 2018-04-17 DIAGNOSIS — R0602 Shortness of breath: Secondary | ICD-10-CM | POA: Diagnosis present

## 2018-04-17 DIAGNOSIS — Z91040 Latex allergy status: Secondary | ICD-10-CM | POA: Diagnosis not present

## 2018-04-17 DIAGNOSIS — J449 Chronic obstructive pulmonary disease, unspecified: Secondary | ICD-10-CM | POA: Diagnosis present

## 2018-04-17 DIAGNOSIS — J9622 Acute and chronic respiratory failure with hypercapnia: Secondary | ICD-10-CM | POA: Diagnosis present

## 2018-04-17 DIAGNOSIS — F419 Anxiety disorder, unspecified: Secondary | ICD-10-CM | POA: Diagnosis present

## 2018-04-17 DIAGNOSIS — R040 Epistaxis: Secondary | ICD-10-CM | POA: Diagnosis present

## 2018-04-17 DIAGNOSIS — N183 Chronic kidney disease, stage 3 unspecified: Secondary | ICD-10-CM

## 2018-04-17 DIAGNOSIS — E873 Alkalosis: Secondary | ICD-10-CM | POA: Diagnosis not present

## 2018-04-17 DIAGNOSIS — I2729 Other secondary pulmonary hypertension: Secondary | ICD-10-CM | POA: Diagnosis present

## 2018-04-17 DIAGNOSIS — Z9981 Dependence on supplemental oxygen: Secondary | ICD-10-CM | POA: Diagnosis not present

## 2018-04-17 DIAGNOSIS — T45515A Adverse effect of anticoagulants, initial encounter: Secondary | ICD-10-CM | POA: Diagnosis present

## 2018-04-17 DIAGNOSIS — N390 Urinary tract infection, site not specified: Secondary | ICD-10-CM | POA: Diagnosis present

## 2018-04-17 LAB — BASIC METABOLIC PANEL
Anion gap: 15 (ref 5–15)
BUN: 23 mg/dL (ref 8–23)
CALCIUM: 9.2 mg/dL (ref 8.9–10.3)
CHLORIDE: 95 mmol/L — AB (ref 98–111)
CO2: 33 mmol/L — ABNORMAL HIGH (ref 22–32)
Creatinine, Ser: 1.34 mg/dL — ABNORMAL HIGH (ref 0.44–1.00)
GFR, EST AFRICAN AMERICAN: 43 mL/min — AB (ref >60)
GFR, EST NON AFRICAN AMERICAN: 37 mL/min — AB (ref >60)
Glucose, Bld: 150 mg/dL — ABNORMAL HIGH (ref 70–99)
Potassium: 5 mmol/L (ref 3.5–5.1)
SODIUM: 143 mmol/L (ref 135–145)

## 2018-04-17 LAB — TROPONIN I: Troponin I: 0.03 ng/mL (ref <0.03)

## 2018-04-17 MED ORDER — FUROSEMIDE 10 MG/ML IJ SOLN
40.0000 mg | INTRAMUSCULAR | Status: AC
  Administered 2018-04-17: 40 mg via INTRAVENOUS
  Filled 2018-04-17: qty 4

## 2018-04-17 MED ORDER — AMIODARONE HCL IN DEXTROSE 360-4.14 MG/200ML-% IV SOLN
30.0000 mg/h | INTRAVENOUS | Status: DC
  Administered 2018-04-17 – 2018-04-18 (×2): 30 mg/h via INTRAVENOUS
  Filled 2018-04-17 (×2): qty 200

## 2018-04-17 MED ORDER — AMIODARONE LOAD VIA INFUSION
150.0000 mg | Freq: Once | INTRAVENOUS | Status: AC
  Administered 2018-04-17: 150 mg via INTRAVENOUS
  Filled 2018-04-17: qty 83.34

## 2018-04-17 MED ORDER — POTASSIUM CHLORIDE CRYS ER 20 MEQ PO TBCR
40.0000 meq | EXTENDED_RELEASE_TABLET | Freq: Two times a day (BID) | ORAL | Status: DC
  Administered 2018-04-18 – 2018-04-24 (×14): 40 meq via ORAL
  Filled 2018-04-17 (×16): qty 2

## 2018-04-17 MED ORDER — FUROSEMIDE 10 MG/ML IJ SOLN
40.0000 mg | Freq: Two times a day (BID) | INTRAMUSCULAR | Status: DC
  Administered 2018-04-17 – 2018-04-18 (×2): 40 mg via INTRAVENOUS
  Filled 2018-04-17 (×2): qty 4

## 2018-04-17 MED ORDER — SODIUM CHLORIDE 0.9% FLUSH
3.0000 mL | INTRAVENOUS | Status: DC | PRN
  Administered 2018-04-20: 3 mL via INTRAVENOUS
  Filled 2018-04-17: qty 3

## 2018-04-17 MED ORDER — SODIUM CHLORIDE 0.9 % IV SOLN
250.0000 mL | INTRAVENOUS | Status: DC
  Administered 2018-04-18: 14:00:00 via INTRAVENOUS

## 2018-04-17 MED ORDER — SODIUM CHLORIDE 0.9% FLUSH
3.0000 mL | Freq: Two times a day (BID) | INTRAVENOUS | Status: DC
  Administered 2018-04-17 – 2018-04-24 (×12): 3 mL via INTRAVENOUS

## 2018-04-17 MED ORDER — ACETAMINOPHEN 325 MG PO TABS
650.0000 mg | ORAL_TABLET | Freq: Four times a day (QID) | ORAL | Status: DC | PRN
  Administered 2018-04-17 – 2018-04-25 (×4): 650 mg via ORAL
  Filled 2018-04-17 (×4): qty 2

## 2018-04-17 MED ORDER — AMIODARONE HCL IN DEXTROSE 360-4.14 MG/200ML-% IV SOLN
60.0000 mg/h | INTRAVENOUS | Status: AC
  Administered 2018-04-17: 60 mg/h via INTRAVENOUS
  Filled 2018-04-17 (×2): qty 200

## 2018-04-17 NOTE — Progress Notes (Signed)
   04/16/18 2353  MEWS Score  Resp (!) 28  Pulse Rate (!) 119  BP (!) 94/58  Temp 97.9 F (36.6 C)  MEWS RR 2  MEWS Pulse 2  MEWS Systolic 1  MEWS LOC 0  MEWS Temp 0  MEWS Score 5  MEWS Score Color Red   Vital Signs MEWS/VS Documentation      04/16/2018 1945 04/16/2018 2214 04/16/2018 2353 04/17/2018 0357   MEWS Score:  3  1  5  2    MEWS Score Color:  Yellow  Green  Red  Yellow   Resp:  (!) 24  -  (!) 28  (!) 26   Pulse:  (!) 125  95  (!) 119  82   BP:  (!) 104/58  (!) 148/119  (!) 94/58  130/66   Temp:  98.1 F (36.7 C)  -  97.9 F (36.6 C)  97.6 F (36.4 C)   O2 Device:  Nasal Cannula  -  Nasal Cannula  Nasal Cannula   O2 Flow Rate (L/min):  4 L/min  -  4 L/min  4 L/min     Patient is at baseline. Admitted with respiratory failure. Patient is very SOB. Receiving lasix IV BID.       Charlotte Fidalgo A Yacob Wilkerson 04/17/2018,5:23 AM

## 2018-04-17 NOTE — Plan of Care (Signed)
  Problem: Health Behavior/Discharge Planning: Goal: Ability to manage health-related needs will improve Outcome: Progressing   Problem: Clinical Measurements: Goal: Ability to maintain clinical measurements within normal limits will improve Outcome: Progressing   Problem: Clinical Measurements: Goal: Will remain free from infection Outcome: Progressing   Problem: Coping: Goal: Level of anxiety will decrease Outcome: Progressing   Problem: Safety: Goal: Ability to remain free from injury will improve Outcome: Progressing

## 2018-04-17 NOTE — Consult Note (Addendum)
Advanced Heart Failure Team Consult Note   Primary Physician: Maurice Small, MD PCP-Cardiologist:  No primary care provider on file.  Reason for Consultation: SOB  HPI:    [name] is seen today for evaluation of SOB at the request of [name].   [name] is a 77 y.o. female with a history of chronic diastolic CHF, moderate pulmonary HTN, HTN, CAD s/p CABG, COPD on home oxygen, and dyslipidemia. Well known to the HF clinic  Seen last in HF clinic 03/22/18. Had been given extra torsemide, and weight was down 6 lbs. Was very sedentary with chronic orthopnea and dizziness with standing. Since then she has been made a DNR after discussion at that visit.   She present to [facility] 04/16/18 with complaints of worsening dyspnea, as well as severe nose bleeds. She states she has gradually felt worse over the past 2 months, with no real improvements across multiple admission. She also complained of chest tightness prompting cyclic troponins. Pertinent labs on admission include Cr 1.31, K 3.8, BNP 745 (up from 200-300 baseline), Hgb 10.7, WBC 9.0. CXR showed small left pleural effusion with left base atelectasis. No frank edema or consolidation.   Remains very SOB. 3-4 word dyspnea. Weight only up a couple of lbs at home. She has felt worse for about a week. SOB at rest, worse with any exertion. She does have lightheadedness with rapid standing. She does not tolerate a CPAP mask, and has never had at home. She has had several nose bleeds this week on Xarelto.   Weight down 4 lbs on 40 mg BID.   Echo 7/19 with EF 60-65%, moderate LVH, rheumatic MV with moderate MS (mean gradient 8, MVA 1.58 cm^2), mild-moderate MR, severe TR, PASP 762 mmHg, D-shaped interventricular septum with mildly dilated RV  Review of Systems: [y] = yes, [ ]  = no   General: Weight gain [y]; Weight loss [ ] ; Anorexia [ ] ; Fatigue [y]; Fever [ ] ; Chills [ ] ; Weakness [y]  Cardiac: Chest pain/pressure [ ] ; Resting SOB [  ]; Exertional SOB [y]; Orthopnea [y]; Pedal Edema [y]; Palpitations [ ] ; Syncope [ ] ; Presyncope [ ] ; Paroxysmal nocturnal dyspnea[ ]   Pulmonary: Cough [ ] ; Wheezing[ ] ; Hemoptysis[ ] ; Sputum [ ] ; Snoring [ ]   GI: Vomiting[ ] ; Dysphagia[ ] ; Melena[ ] ; Hematochezia [ ] ; Heartburn[ ] ; Abdominal pain [ ] ; Constipation [ ] ; Diarrhea [ ] ; BRBPR [ ]   GU: Hematuria[ ] ; Dysuria [ ] ; Nocturia[ ]   Vascular: Pain in legs with walking [ ] ; Pain in feet with lying flat [ ] ; Non-healing sores [ ] ; Stroke [ ] ; TIA [ ] ; Slurred speech [ ] ;  Neuro: Headaches[ ] ; Vertigo[ ] ; Seizures[ ] ; Paresthesias[ ] ;Blurred vision [ ] ; Diplopia [ ] ; Vision changes [ ]   Ortho/Skin: Arthritis [y]; Joint pain [y]; Muscle pain [ ] ; Joint swelling [ ] ; Back Pain [ ] ; Rash [ ]   Psych: Depression[ ] ; Anxiety[ ]   Heme: Bleeding problems [ ] ; Clotting disorders [ ] ; Anemia [ ]   Endocrine: Diabetes [ ] ; Thyroid dysfunction[ ]   Home Medications Prior to Admission medications   Medication Sig Start Date End Date Taking? Authorizing Provider  acetaminophen (TYLENOL) 500 MG tablet Take 1,000 mg by mouth 2 (two) times daily as needed for headache.    Yes [provider]  amiodarone (PACERONE) 200 MG tablet Take 1 tablet (200 mg total) by mouth daily. 03/21/18  Yes Bolivar Koranda, Bevelyn Buckles, MD  calcium-vitamin D (OSCAL WITH D) 500-200  MG-UNIT per tablet Take 2 tablets by mouth daily.    Yes [provider]  clonazePAM (KLONOPIN) 0.5 MG tablet Take 1 tablet (0.5 mg total) by mouth at bedtime. 02/01/18  Yes Meredeth Ide, MD  diclofenac sodium (VOLTAREN) 1 % GEL Apply 2 g topically daily as needed (pain). 02/01/18  Yes Meredeth Ide, MD  docusate sodium (COLACE) 100 MG capsule Take 200 mg by mouth as needed for mild constipation.   Yes [provider]  ferrous gluconate (FERGON) 324 MG tablet Take 324 mg by mouth daily.   Yes [provider]  ipratropium (ATROVENT) 0.02 % nebulizer solution INHALE THE CONTENTS OF 1   VIAL VIA NEBULIZER 4 TIMES  DAILY Patient taking differently: Take 0.5 mg by nebulization every 6 (six) hours as needed for wheezing or shortness of breath.  03/06/18  Yes Michele Mcalpine, MD  isosorbide mononitrate (IMDUR) 60 MG 24 hr tablet Take 60 mg by mouth daily.    Yes [provider]  loratadine (CLARITIN) 10 MG tablet Take 10 mg by mouth daily.    Yes [provider]  metoprolol tartrate (LOPRESSOR) 50 MG tablet Take 1 tablet (50 mg total) by mouth 2 (two) times daily. 09/01/17  Yes Laurey Morale, MD  nitroGLYCERIN (NITROSTAT) 0.4 MG SL tablet place 1 tablet under the tongue if needed every 5 minutes for chest pain for 3 doses IF NO RELIEF AFTER 3RD DOSE CALL PRESCRIBER OR 911. 03/22/18  Yes Graciella Freer, PA-C  omeprazole (PRILOSEC) 40 MG capsule Take 40 mg by mouth daily. 11/22/17  Yes [provider]  OXYGEN Inhale 2-3 L into the lungs continuous. At rest 2 liters  Moving around 3 liters   Yes [provider]  potassium chloride SA (K-DUR,KLOR-CON) 20 MEQ tablet Take 2 tablets (40 mEq total) by mouth 2 (two) times daily. 03/30/18  Yes Laurey Morale, MD  Propylene Glycol (SYSTANE BALANCE) 0.6 % SOLN Apply 1 drop to eye as needed (sensitivity).   Yes [provider]  Rivaroxaban (XARELTO) 15 MG TABS tablet Take 1 tablet (15 mg total) by mouth daily with supper. 10/26/17  Yes Laurey Morale, MD  simvastatin (ZOCOR) 20 MG tablet TAKE 3 TABLETS BY MOUTH  DAILY AT 6 PM Patient taking differently: Take 60 mg by mouth daily at 6 PM.  05/30/17  Yes Laurey Morale, MD  spironolactone (ALDACTONE) 25 MG tablet TAKE 1 TABLET BY MOUTH  DAILY 05/30/17  Yes Laurey Morale, MD  torsemide (DEMADEX) 20 MG tablet Take 4 tablets (80 mg total) by mouth 2 (two) times daily. 02/12/18  Yes Laurey Morale, MD  benzonatate (TESSALON) 200 MG capsule Take 1 capsule (200 mg total) by mouth 3 (three) times daily as needed for cough. Patient not taking: Reported on  04/16/2018 08/26/16   Tonye Becket D, NP  Glycopyrrolate-Formoterol (BEVESPI AEROSPHERE) 9-4.8 MCG/ACT AERO Inhale 2 puffs into the lungs 2 (two) times daily. Patient not taking: Reported on 04/16/2018 04/06/18   Waymon Budge, MD  Glycopyrrolate-Formoterol (BEVESPI AEROSPHERE) 9-4.8 MCG/ACT AERO Inhale 2 puffs into the lungs 2 (two) times daily. Patient not taking: Reported on 04/16/2018 04/06/18   Waymon Budge, MD    Past Medical History: Past Medical History:  Diagnosis Date  . Acute parotitis 01/05/2016  . Anxiety   . Bacteremia, escherichia coli   . Bursitis, shoulder    bilateral  . CHF (congestive heart failure) (HCC)    chronic diasotlic  CHF  . Complication of anesthesia    "have trouble getting me awake sometimes; been ok latelhy" (09/24/2015)  . COPD (chronic obstructive pulmonary disease) (HCC)   . Coronary heart disease 2001   s/p CABG  . Depression   . DJD (degenerative joint disease)   . Dyspnea   . GERD (gastroesophageal reflux disease)   . H/O hiatal hernia   . History of rheumatic heart disease    X 3-LAST TIME WHEN PATIENT WAS 77 YRS OLD  . Hyperlipidemia   . Hypertension   . OA (osteoarthritis)   . On home oxygen therapy    "2L; 24/7" (09/24/2015)  . PUD (peptic ulcer disease)   . Pulmonary HTN (HCC)    PASP 50-3mmHg by [id number]  . Sleep apnea    intolerant to CPAP  . Thyroid nodule   . Urinary incontinence   . Urine incontinence   . Varicose veins     Past Surgical History: Past Surgical History:  Procedure Laterality Date  . BILATERAL SALPINGOOPHORECTOMY    . CARDIAC CATHETERIZATION  02/11/2008, 01/30/2004, 06/14/2000  . CARDIAC CATHETERIZATION N/A 09/24/2015   Procedure: Right Heart Cath;  Surgeon: Laurey Morale, MD;  Location: [facility] INVASIVE CV LAB;  Service: Cardiovascular;  Laterality: N/A;  . CARDIAC CATHETERIZATION N/A 02/08/2016   Procedure: Right/Left Heart Cath and Coronary/Graft Angiography;  Surgeon: Laurey Morale, MD;  Location: [facility] INVASIVE  CV LAB;  Service: Cardiovascular;  Laterality: N/A;  . CARDIOVERSION N/A 06/12/2016   Procedure: CARDIOVERSION;  Surgeon: Duke Salvia, MD;  Location: [facility] OR;  Service: Cardiovascular;  Laterality: N/A;  . CORONARY ARTERY BYPASS GRAFT  06/21/2000   x4  . INCONTINENCE SURGERY     "tacked"  . RIGHT HEART CATH N/A 02/26/2018   Procedure: RIGHT HEART CATH;  Surgeon: Laurey Morale, MD;  Location: [facility] INVASIVE CV LAB;  Service: Cardiovascular;  Laterality: N/A;  . TEE WITHOUT CARDIOVERSION N/A 09/24/2015   Procedure: TRANSESOPHAGEAL ECHOCARDIOGRAM (TEE);  Surgeon: Laurey Morale, MD;  Location: [facility] ENDOSCOPY;  Service: Cardiovascular;  Laterality: N/A;  . THYROIDECTOMY, PARTIAL  2010   Dr Michaell Cowing  . TOTAL ABDOMINAL HYSTERECTOMY  1990   1990  . TUBAL LIGATION    . VAGINAL DELIVERY  x4    Family History: Family History  Problem Relation Age of Onset  . Anemia Mother   . Emphysema Father   . Esophageal cancer Daughter     Social History: Social History   Socioeconomic History  . Marital status: Married    Spouse name: Not on file  . Number of children: Not on file  . Years of education: Not on file  . Highest education level: Not on file  Occupational History  . Occupation: retired  Engineer, production  . Financial resource strain: Not on file  . Food insecurity:    Worry: Not on file    Inability: Not on file  . Transportation needs:    Medical: Not on file    Non-medical: Not on file  Tobacco Use  . Smoking status: Former Smoker    Packs/day: 1.00    Years: 47.00    Pack years: 47.00    Types: Cigarettes    Last attempt to quit: 07/26/2007    Years since quitting: 10.7  . Smokeless tobacco: Never Used  Substance and Sexual Activity  . Alcohol use: No  . Drug use: No  . Sexual activity: Not Currently  Lifestyle  . Physical activity:    Days  per week: Not on file    Minutes per session: Not on file  . Stress: Not on file  Relationships  . Social connections:    Talks on  phone: Not on file    Gets together: Not on file    Attends religious service: Not on file    Active member of club or organization: Not on file    Attends meetings of clubs or organizations: Not on file    Relationship status: Not on file  Other Topics Concern  . Not on file  Social History Narrative   Husband bilateral left amputee for atheorsclerotic disease    Allergies:  Allergies  Allergen Reactions  . Levalbuterol Other (See Comments)    Made mouth and throat sore Made mouth and throat sore  . Lisinopril Cough    Developed persistent dry cough for a month.   . Tape Other (See Comments)    Bleeding  . Cefpodoxime Other (See Comments)    Yeast infections   . Codeine Other (See Comments)    Anxious/ jittery  . Lipitor [Atorvastatin] Other (See Comments)    Made joint start hurting/ Muscle aches  . Other Swelling    Venholin HFA  . Penicillins Other (See Comments)    childhood reaction Has patient had a PCN reaction causing immediate rash, facial/tongue/throat swelling, SOB or lightheadedness with hypotension: unknown Has patient had a PCN reaction causing severe rash involving mucus membranes or skin necrosis: No Has patient had a PCN reaction that required hospitalization No Has patient had a PCN reaction occurring within the last 10 years: No If all of the above answers are "NO", then may proceed with Cephalosporin use.   Terald Sleeper [Kdc:Albuterol] Other (See Comments)    "jittery"  . Celexa [Citalopram Hydrobromide]   . Lyrica [Pregabalin]   . Anoro Ellipta [Umeclidinium-Vilanterol] Itching  . Clindamycin Other (See Comments)    Unknown   . Hydrocodone-Acetaminophen Anxiety  . Latex Itching and Rash    Objective:    Vital Signs:   Temp:  [97.6 F (36.4 C)-98.7 F (37.1 C)] 97.6 F (36.4 C) (09/24 0805) Pulse Rate:  [41-125] 69 (09/24 0805) Resp:  [16-35] 20 (09/24 0805) BP: (90-148)/(35-119) 113/58 (09/24 0805) SpO2:  [92 %-100 %] 94 % (09/24  0805) Weight:  [86.8 kg] 86.8 kg (09/24 0357) Last BM Date: 04/14/18  Weight change: Filed Weights   04/16/18 0927 04/17/18 0357  Weight: 88.5 kg 86.8 kg    Intake/Output:   Intake/Output Summary (Last 24 hours) at 04/17/2018 1005 Last data filed at 04/17/2018 0913 Gross per 24 hour  Intake 240 ml  Output 450 ml  Net -210 ml      Physical Exam    General:  Elderly, fraily. NAD.  HEENT: Normal Neck: Supple. JVP 9-10 cm. Carotids 2+ bilat; no bruits. No lymphadenopathy or thyromegaly appreciated. Cor: PMI nondisplaced. Irregularly irregular. . 2/6 SEM RUSB with clear S2.  Lungs: Diminished basilar sounds. No active wheeze. On 3L O2.  Abdomen: soft, nontender, nondistended. No hepatosplenomegaly. No bruits or masses. Good bowel sounds. Extremities: no cyanosis, clubbing, or rash. 1+ edema Neuro: alert & orientedx3, cranial nerves grossly intact. moves all 4 extremities w/o difficulty. Affect pleasant  Telemetry   Afib in 80-90s, personally reviewed.  EKG    Shows "afib 91 bpm", personally reviewed.   Labs   Basic Metabolic Panel: Recent Labs  Lab 04/16/18 1039 04/17/18 0238  NA 142 143  K 3.8 5.0  CL 93* 95*  CO2 34* 33*  GLUCOSE 102* 150*  BUN 19 23  CREATININE 1.31* 1.34*  CALCIUM 9.1 9.2    Liver Function Tests: Recent Labs  Lab 04/16/18 1039  AST 15  ALT 11  ALKPHOS 63  BILITOT 0.4  PROT 6.6  ALBUMIN 3.1*   No results for input(s): LIPASE, AMYLASE in the last 168 hours. No results for input(s): AMMONIA in the last 168 hours.  CBC: Recent Labs  Lab 04/16/18 1039  WBC 9.0  NEUTROABS 6.7  HGB 10.7*  HCT 37.0  MCV 101.1*  PLT 223    Cardiac Enzymes: Recent Labs  Lab 04/16/18 1039 04/16/18 1551 04/16/18 2020 04/17/18 0238  TROPONINI 0.03* 0.03* <0.03 0.03*    BNP: BNP (last 3 results) Recent Labs    02/21/18 1717 03/22/18 1514 04/16/18 1039  BNP 252.7* 357.1* 745.9*    ProBNP (last 3 results) No results for input(s):  PROBNP in the last 8760 hours.   CBG: No results for input(s): GLUCAP in the last 168 hours.  Coagulation Studies: No results for input(s): LABPROT, INR in the last 72 hours.   Imaging   Dg Chest 2 View  Result Date: 04/16/2018 CLINICAL DATA:  Shortness of breath.  Atrial fibrillation EXAM: CHEST - 2 VIEW COMPARISON:  February 23, 2018 FINDINGS: There is fibrosis in the lung bases, stable. There is a small pleural effusion on the left with left base atelectasis. The lungs elsewhere are clear. Heart is mildly enlarged with pulmonary vascularity normal. Patient is status post coronary artery bypass grafting. There is aortic atherosclerosis. No evident bone lesions. There is anterior wedging of lower thoracic vertebral bodies. IMPRESSION: Small left pleural effusion with left base atelectasis. Underlying bibasilar fibrosis. No frank edema or consolidation. Stable cardiac prominence. Postoperative changes are noted.  There is aortic atherosclerosis. Aortic Atherosclerosis (ICD10-I70.0). Electronically Signed   By: Bretta Bang III M.D.   On: 04/16/2018 10:28      Medications:     Current Medications: . amiodarone  200 mg Oral Daily  . calcium-vitamin D  2 tablet Oral Daily  . clonazePAM  0.5 mg Oral QHS  . diclofenac sodium  2 g Topical QID  . ferrous gluconate  324 mg Oral Daily  . furosemide  40 mg Intravenous Q12H  . isosorbide mononitrate  60 mg Oral Daily  . loratadine  10 mg Oral Daily  . metoprolol tartrate  50 mg Oral BID  . pantoprazole  40 mg Oral Daily  . potassium chloride SA  40 mEq Oral BID  . Rivaroxaban  15 mg Oral Q supper  . simvastatin  60 mg Oral q1800  . spironolactone  25 mg Oral Daily     Infusions:     Patient Profile   AVONELL LENIG is a 77 y.o. female with a history of chronic diastolic CHF, moderate pulmonary HTN, HTN, CAD s/p CABG, COPD on home oxygen, and dyslipidemia. Well known to the HF clinic  Admitted 04/16/18 for SOB and c/o nose  bleeds. Hgb stable. BNP and volume status elevated.   Assessment/Plan   1. 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 Suspect significant RV failure in setting of moderate mitral stenosis and severe TR. She has been very difficult to manage due to combination of CHF, valvular heart  disease and severe COPD. Normal CO on RHC 02/26/18. - Volume status elevated on exam. Tolerates Afib very poorly. - give additional dose of 40 mg IV lasix now. Increase to 60 mg BID for tonight.   - Continuespironolactone 25 mg daily.  - She is followed by [facility] with diuretic protocol in place. Discussed that we may need to use IV lasix if weight does not start trending down.  2. CAD:S/P CABG. -She has potential source of angina from 90% stenosis in distal LCx. The vessel is small at this point and not amenable to PCI. - No s/s of ischemia.    - Continue Imdur 90 mg daily.  - Continue statin.  - No ASA given Xarelto use.  3. Atrial fibrillation: Paroxysmal. - She is in rate controlled Afib. Has felt bad for the past week. - Start Iv amiodarone with bolus. Plan for DCCV tomorrow. She has not missed any Xarelto.  - Continue lopressor 50 mg BID 4.COPD - PFTs in 2/17 with moderate to severe obstruction.  - Continue chronic O2 2-3 lpm.  - Follows with Dr Maple Hudson 5. 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 She also has mild to moderate MR.Suspect that the MR makes her a poor candidate for mitral valvuloplasty, and she also would not tolerate anesthesia well with severe COPD. She also would be a poor surgical candidate due to frailty and severe COPD.Suspectour only choice here will be medical management.  - No change.  6. Pulmonary HTN:  - This  is primarily pulmonary venous hypertension based on last RHC.  - Focus on diuresis. No change.  7. OSA - Intolerant of CPAP. No change.  8. Anemia - Stable on admit. Per primary.   Volume overloaded and dyspneic in the setting of Afib. Last DCCV 05/2016. She reports strict compliance with her Xarelto. Will plan for DCCV tomorrow.   Medication concerns reviewed with patient and pharmacy team. Barriers identified: None at this time.   Length of Stay: 0  Luane School  04/17/2018, 10:05 AM  Advanced Heart Failure Team Pager [phone] (M-F; 7a - 4p)  Please contact [facility] for night-coverage after hours (4p -7a ) and weekends on [email or web address]  Patient seen and examined with the above-signed Advanced Practice Provider and/or Housestaff. I personally reviewed laboratory data, imaging studies and relevant notes. I independently examined the patient and formulated the important aspects of the plan. I have edited the note to reflect any of my changes or salient points. I have personally discussed the plan with the patient and/or family.  77 y/o morbidly obese woman with COPD, OSA (intolerant of CPAP), moderate MS/MR, CAD s/p CABG, PAF and diastolic HF admitted with recurrent diastolic HF in setting of recurrent PAF. Last DC-CCV was in 2017. Has been on uninterrupted Xarelto. Will continue with IV diuresis today, start IV amio and arrange DC-CV tomorrow with Dr. Shirlee Latch. Stressed need to work on trying to find a CPAP therapy that works for her.   Arvilla Meres, MD  7:58 PM

## 2018-04-17 NOTE — Care Management Note (Signed)
Case Management Note  Patient Details  Name: DANYIEL GUERRERA MRN: [id number] Date of Birth: 06/28/1941  Subjective/Objective:   CHF                 Action/Plan: NCM spoke to pt at bedside. She lives at home with husband and son. Son is there at night only. She had home oxygen, neb machine, RW and wheelchair. Will need HH RN ([facility] Vest Protocol), PT, OT and aide orders with F2F. Pt will benefit for CHF Disease Management with [facility] Vest Protocol. Pt declines SNF.   Expected Discharge Date:                  Expected Discharge Plan:  Home w Home Health Services  In-House Referral:  NA  Discharge planning Services  CM Consult  Post Acute Care Choice:  Home Health Choice offered to:  Patient  DME Arranged:  N/A DME Agency:  NA  HH Arranged:  RN, PT, OT, Nurse's Aide HH Agency:  [facility]  Status of Service:  In process, will continue to follow  If discussed at Long Length of Stay Meetings, dates discussed:    Additional Comments:  Elliot Cousin, RN 04/17/2018, 5:30 PM

## 2018-04-17 NOTE — Progress Notes (Signed)
PROGRESS NOTE   [name]  ZOX:[id number]    DOB: Dec 22, 1940    DOA: 04/16/2018  PCP: Maurice Small, MD   I have briefly reviewed patients previous medical records in [facility].  Brief Narrative:  77 year old married female, lives with spouse who is an amputee and wheelchair-bound, patient ambulates with the help of a walker, PMH of chronic diastolic CHF, moderate pulmonary HTN, HTN, HLD, CAD status post CABG, PAF on Xarelto, COPD, chronic hypoxic respiratory failure on home oxygen (2 L at rest and 3 L with activity), anxiety and depression, GERD, negative sleep study 02/22/2016, followed at the HF clinic, last seen in HF clinic 03/22/2018 when given extra dose of torsemide, presented to [facility] ED 04/16/2018 due to progressively worsening dyspnea, significantly decreased effort tolerance over the last month or 2, intermittent chest tightness and intermittent epistaxis.  Admitted for acute on chronic diastolic CHF, acute on chronic hypoxic respiratory failure. HF Cardiology consulted.   Assessment & Plan:   Active Problems:   CHF exacerbation (HCC)   Acute on chronic diastolic CHF/RV failure: TTE 01/30/2018: LVEF 60-65%, unable to assess LV diastolic function, RV volume overload, moderate MS severe TR and PA peak pressure 62 mmHg. HF Cardiology consulted and input appreciated: Suspect significant RV failure due to moderate MS and severe TR.  She apparently has been very difficult to manage due to combination of CHF, valvular heart disease and severe COPD.  She does not tolerate A. fib well.  She was given additional dose of IV Lasix 40 mg x 1 and increased Lasix to 60 mg IV twice daily, continued Aldactone 25 mg daily.  - 250 mL dense admission, not sure if this is accurate.  Strict intake output and daily weight.  Follows with Dr. Shirlee Latch, Cardiology  COPD: No clinical bronchospasm.  Follows with Dr. Maple Hudson, Pulmonology.  Acute on chronic hypoxic respiratory failure: Precipitated by  decompensated CHF complicating underlying COPD and obesity.  Sleep study 2017 neg for OSA.  Treat CHF as above and continue oxygen supplementation.  Paroxysmal A. fib: Reports RVR in the 100s at home and had this on initial arrival.  Now rate controlled.  Continue metoprolol 50 mg twice daily.  Cardiology input appreciated, started IV amiodarone, plan for DCCV 9/25.  Patient has not missed any Xarelto.  Epistaxis: Intermittent, likely due to Xarelto and home oxygen use.  Insure humidified oxygen.  If worsens, may need ENT consultation.  CAD status post CABG: Per cardiology, 90% stenosis and distal LCx, not amenable to PCI.  Currently without anginal symptoms.  Troponins minimally elevated in the 0.03 range and flat trend.  Continue Imdur, metoprolol and simvastatin.  No aspirin due to Xarelto  Rheumatic mitral stenosis, moderate: As per cardiology, poor candidate for mitral valvuloplasty or surgery.  Hence medical management.  Pulmonary hypertension: Diuretics as above.  Continue oxygen supplementation.  Macrocytic anemia: Hemoglobin stable.  Stage III chronic kidney disease: Baseline creatinine may be in the 1.3 range, at baseline.  Follow BMP closely.  Essential hypertension: Fluctuating BPs.  Antihypertensives as above and monitor closely.  Hyperlipidemia: Continue statins.  GERD: Continue PPI.  Adult failure to thrive: Multifactorial due to advanced age, multiple severe significant comorbidities as listed above with acute decompensation.  Anxiety and depression: Stable.   DVT prophylaxis: On Xarelto. Code Status: DNR. Family Communication: None at bedside. Disposition: Patient has complex multiple severe significant medical problems as discussed above including decompensated CHF, resultant acute on chronic hypoxic respiratory failure, being aggressively diuresed  with IV Lasix, on IV amiodarone for A. fib which she tolerates poorly, with plans for DCCV by cardiology on 9/25.  She  thereby meets criteria for inpatient admission, changed.   Consultants:  Cardiology  Procedures:  None  Antimicrobials:  None   Subjective: Feels slightly better.  Dyspnea slightly improved but still has significant dyspnea on minimal exertion.  No chest pain reported.  Denies cough, fever or chills.  ROS: As above, otherwise negative.  Objective:  Vitals:   04/16/18 2214 04/16/18 2353 04/17/18 0357 04/17/18 0805  BP: (!) 148/119 (!) 94/58 130/66 (!) 113/58  Pulse: 95 (!) 119 82 69  Resp:  (!) 28 (!) 26 20  Temp:  97.9 F (36.6 C) 97.6 F (36.4 C) 97.6 F (36.4 C)  TempSrc:  Oral Oral Oral  SpO2:  96% 98% 94%  Weight:   86.8 kg   Height:        Examination:  General exam: Pleasant elderly female, moderately built and obese, sitting up in bed with mild dyspnea with minimal exertion. Respiratory system: Reduced breath sounds bilaterally with few bibasilar fine crackles.  Rest of lung fields clear to auscultation without wheezing or rhonchi.  Mild increased work of breathing, especially with minimal exertion or talking. Cardiovascular system: S1 & S2 heard, irregularly irregular.  JVD +.  Trace bilateral ankle edema.  Grade 3 x 6 systolic murmur best heard at apex.  Telemetry personally reviewed: A. fib with controlled ventricular rate. Gastrointestinal system: Abdomen is nondistended, soft and nontender. No organomegaly or masses felt. Normal bowel sounds heard. Central nervous system: Alert and oriented. No focal neurological deficits. Extremities: Symmetric 5 x 5 power. Skin: No rashes, lesions or ulcers Psychiatry: Judgement and insight appear normal. Mood & affect appropriate.     Data Reviewed: I have personally reviewed following labs and imaging studies  CBC: Recent Labs  Lab 04/16/18 1039  WBC 9.0  NEUTROABS 6.7  HGB 10.7*  HCT 37.0  MCV 101.1*  PLT 223   Basic Metabolic Panel: Recent Labs  Lab 04/16/18 1039 04/17/18 0238  NA 142 143  K 3.8 5.0   CL 93* 95*  CO2 34* 33*  GLUCOSE 102* 150*  BUN 19 23  CREATININE 1.31* 1.34*  CALCIUM 9.1 9.2   Liver Function Tests: Recent Labs  Lab 04/16/18 1039  AST 15  ALT 11  ALKPHOS 63  BILITOT 0.4  PROT 6.6  ALBUMIN 3.1*   Cardiac Enzymes: Recent Labs  Lab 04/16/18 1039 04/16/18 1551 04/16/18 2020 04/17/18 0238  TROPONINI 0.03* 0.03* <0.03 0.03*    Radiology Studies: Dg Chest 2 View  Result Date: 04/16/2018 CLINICAL DATA:  Shortness of breath.  Atrial fibrillation EXAM: CHEST - 2 VIEW COMPARISON:  February 23, 2018 FINDINGS: There is fibrosis in the lung bases, stable. There is a small pleural effusion on the left with left base atelectasis. The lungs elsewhere are clear. Heart is mildly enlarged with pulmonary vascularity normal. Patient is status post coronary artery bypass grafting. There is aortic atherosclerosis. No evident bone lesions. There is anterior wedging of lower thoracic vertebral bodies. IMPRESSION: Small left pleural effusion with left base atelectasis. Underlying bibasilar fibrosis. No frank edema or consolidation. Stable cardiac prominence. Postoperative changes are noted.  There is aortic atherosclerosis. Aortic Atherosclerosis (ICD10-I70.0). Electronically Signed   By: Bretta Bang III M.D.   On: 04/16/2018 10:28        Scheduled Meds: . calcium-vitamin D  2 tablet Oral Daily  . clonazePAM  0.5 mg Oral QHS  . diclofenac sodium  2 g Topical QID  . ferrous gluconate  324 mg Oral Daily  . furosemide  40 mg Intravenous Q12H  . isosorbide mononitrate  60 mg Oral Daily  . loratadine  10 mg Oral Daily  . metoprolol tartrate  50 mg Oral BID  . pantoprazole  40 mg Oral Daily  . potassium chloride SA  40 mEq Oral BID  . Rivaroxaban  15 mg Oral Q supper  . simvastatin  60 mg Oral q1800  . sodium chloride flush  3 mL Intravenous Q12H  . spironolactone  25 mg Oral Daily   Continuous Infusions: . sodium chloride    . amiodarone 60 mg/hr (04/17/18 1117)    Followed by  . amiodarone       LOS: 0 days     Marcellus Scott, MD, FACP, [facility]. [facility] Pager [phone]  If 7PM-7AM, please contact night-coverage [email or web address] Password [facility] 04/17/2018, 12:41 PM

## 2018-04-17 NOTE — Plan of Care (Signed)
  Problem: Elimination: Goal: Will not experience complications related to bowel motility Outcome: Progressing   Problem: Nutrition: Goal: Adequate nutrition will be maintained Outcome: Progressing   Problem: Coping: Goal: Level of anxiety will decrease Outcome: Progressing

## 2018-04-17 NOTE — Progress Notes (Signed)
CRITICAL VALUE ALERT  Critical Value:  Troponin 0.03  Date & Time Notied:  04/17/18 0417  Provider Notified: Devonne Doughty  Orders Received/Actions taken: No new orders at this time.

## 2018-04-17 NOTE — Plan of Care (Signed)
  Problem: Clinical Measurements: Goal: Ability to maintain clinical measurements within normal limits will improve Outcome: Progressing   Problem: Clinical Measurements: Goal: Will remain free from infection Outcome: Progressing   Problem: Clinical Measurements: Goal: Diagnostic test results will improve Outcome: Progressing   Problem: Clinical Measurements: Goal: Respiratory complications will improve Outcome: Progressing   

## 2018-04-18 ENCOUNTER — Encounter (HOSPITAL_COMMUNITY): Payer: Self-pay

## 2018-04-18 ENCOUNTER — Inpatient Hospital Stay (HOSPITAL_COMMUNITY): Payer: Medicare Other | Admitting: Anesthesiology

## 2018-04-18 ENCOUNTER — Inpatient Hospital Stay (HOSPITAL_COMMUNITY): Payer: Medicare Other

## 2018-04-18 ENCOUNTER — Encounter (HOSPITAL_COMMUNITY)
Admission: EM | Disposition: A | Discharge status: Hospice | Payer: Self-pay | Source: Home / Self Care | Attending: Internal Medicine

## 2018-04-18 DIAGNOSIS — R0602 Shortness of breath: Secondary | ICD-10-CM

## 2018-04-18 HISTORY — PX: CARDIOVERSION: SHX1299

## 2018-04-18 LAB — URINALYSIS, ROUTINE W REFLEX MICROSCOPIC
BILIRUBIN URINE: NEGATIVE
Glucose, UA: NEGATIVE mg/dL
Ketones, ur: NEGATIVE mg/dL
Nitrite: NEGATIVE
Protein, ur: NEGATIVE mg/dL
RBC / HPF: >50 RBC/hpf — ABNORMAL HIGH (ref 0–5)
Specific Gravity, Urine: 1.009 (ref 1.005–1.030)
pH: 6 (ref 5.0–8.0)

## 2018-04-18 LAB — BASIC METABOLIC PANEL
ANION GAP: 9 (ref 5–15)
BUN: 31 mg/dL — AB (ref 8–23)
CALCIUM: 10 mg/dL (ref 8.9–10.3)
CO2: 39 mmol/L — ABNORMAL HIGH (ref 22–32)
Chloride: 95 mmol/L — ABNORMAL LOW (ref 98–111)
Creatinine, Ser: 1.31 mg/dL — ABNORMAL HIGH (ref 0.44–1.00)
GFR calc Af Amer: 44 mL/min — ABNORMAL LOW (ref >60)
GFR calc non Af Amer: 38 mL/min — ABNORMAL LOW (ref >60)
GLUCOSE: 102 mg/dL — AB (ref 70–99)
Potassium: 4.2 mmol/L (ref 3.5–5.1)
SODIUM: 143 mmol/L (ref 135–145)

## 2018-04-18 SURGERY — CARDIOVERSION
Anesthesia: General

## 2018-04-18 MED ORDER — EPHEDRINE SULFATE 50 MG/ML IJ SOLN
INTRAMUSCULAR | Status: DC | PRN
  Administered 2018-04-18: 5 mg via INTRAVENOUS

## 2018-04-18 MED ORDER — PROPOFOL 10 MG/ML IV BOLUS
INTRAVENOUS | Status: DC | PRN
  Administered 2018-04-18: 40 mg via INTRAVENOUS

## 2018-04-18 MED ORDER — FUROSEMIDE 10 MG/ML IJ SOLN
40.0000 mg | Freq: Once | INTRAMUSCULAR | Status: DC
  Filled 2018-04-18: qty 4

## 2018-04-18 MED ORDER — SODIUM CHLORIDE 0.9 % IV SOLN
1.0000 g | INTRAVENOUS | Status: DC
  Administered 2018-04-18 – 2018-04-21 (×4): 1 g via INTRAVENOUS
  Filled 2018-04-18 (×6): qty 10

## 2018-04-18 MED ORDER — FUROSEMIDE 10 MG/ML IJ SOLN
80.0000 mg | Freq: Two times a day (BID) | INTRAMUSCULAR | Status: DC
  Administered 2018-04-18 – 2018-04-23 (×10): 80 mg via INTRAVENOUS
  Filled 2018-04-18 (×11): qty 8

## 2018-04-18 MED ORDER — IPRATROPIUM BROMIDE 0.02 % IN SOLN
0.5000 mg | Freq: Four times a day (QID) | RESPIRATORY_TRACT | Status: DC
  Administered 2018-04-18 – 2018-04-20 (×7): 0.5 mg via RESPIRATORY_TRACT
  Filled 2018-04-18 (×7): qty 2.5

## 2018-04-18 MED ORDER — LIDOCAINE HCL (CARDIAC) PF 100 MG/5ML IV SOSY
PREFILLED_SYRINGE | INTRAVENOUS | Status: DC | PRN
  Administered 2018-04-18: 60 mg via INTRAVENOUS

## 2018-04-18 MED ORDER — TIOTROPIUM BROMIDE MONOHYDRATE 18 MCG IN CAPS
18.0000 ug | ORAL_CAPSULE | Freq: Every day | RESPIRATORY_TRACT | Status: DC
  Filled 2018-04-18: qty 5

## 2018-04-18 MED ORDER — FUROSEMIDE 10 MG/ML IJ SOLN
80.0000 mg | Freq: Once | INTRAMUSCULAR | Status: AC
  Administered 2018-04-18: 80 mg via INTRAVENOUS
  Filled 2018-04-18: qty 8

## 2018-04-18 MED ORDER — SIMVASTATIN 20 MG PO TABS
20.0000 mg | ORAL_TABLET | Freq: Every day | ORAL | Status: DC
  Administered 2018-04-18 – 2018-04-22 (×5): 20 mg via ORAL
  Filled 2018-04-18 (×6): qty 1

## 2018-04-18 MED ORDER — IPRATROPIUM BROMIDE 0.02 % IN SOLN
0.5000 mg | Freq: Four times a day (QID) | RESPIRATORY_TRACT | Status: DC
  Administered 2018-04-18: 0.5 mg via RESPIRATORY_TRACT
  Filled 2018-04-18: qty 2.5

## 2018-04-18 MED ORDER — PHENYLEPHRINE HCL 10 MG/ML IJ SOLN
INTRAMUSCULAR | Status: DC | PRN
  Administered 2018-04-18 (×4): 40 ug via INTRAVENOUS

## 2018-04-18 NOTE — Interval H&P Note (Signed)
History and Physical Interval Note:  04/18/2018 2:02 PM  [name]  has presented today for surgery, with the diagnosis of a fib  The various methods of treatment have been discussed with the patient and family. After consideration of risks, benefits and other options for treatment, the patient has consented to  Procedure(s): CARDIOVERSION (N/A) as a surgical intervention .  The patient's history has been reviewed, patient examined, no change in status, stable for surgery.  I have reviewed the patient's chart and labs.  Questions were answered to the patient's satisfaction.     Chilton Si, MD

## 2018-04-18 NOTE — Progress Notes (Addendum)
Notified Devonne Doughty of patient's blood tinged urine this Am and complaining of burning with urination. Urine is red with some sediment. Awaiting new orders will continue to monitor patient.   Order received for urinalysis with reflex. Will send when patient voids.

## 2018-04-18 NOTE — Progress Notes (Signed)
PT Cancellation Note  Patient Details Name: [name] MRN: [id number] DOB: 23-Jun-1941   Cancelled Treatment:    Reason Eval/Treat Not Completed: Patient at procedure or test/unavailable (Endo). Will cont to follow.  Etta Grandchild, PT, DPT Acute Rehabilitation Services Pager: [phone] Office: [phone]     Etta Grandchild 04/18/2018, 2:05 PM

## 2018-04-18 NOTE — Progress Notes (Signed)
amiodarone infusion stopped per verbal order from Dr. Duke Salvia for BP 77/22 and HR 44.

## 2018-04-18 NOTE — Anesthesia Preprocedure Evaluation (Signed)
Anesthesia Evaluation  Patient identified by MRN, date of birth, ID band Patient awake    Reviewed: Allergy & Precautions, NPO status , Patient's Chart, lab work & pertinent test results  Airway Mallampati: II  TM Distance: >3 FB Neck ROM: Full    Dental no notable dental hx.    Pulmonary sleep apnea , COPD (3 L),  oxygen dependent, former smoker,    Pulmonary exam normal breath sounds clear to auscultation       Cardiovascular hypertension, Pt. on home beta blockers + CAD, + CABG and +CHF  Normal cardiovascular exam Rhythm:Regular Rate:Normal  ECG: a-fib, rate 104  Pulmonary HTN   ECHO: LV EF: 60% -   65%   Neuro/Psych negative neurological ROS  negative psych ROS   GI/Hepatic Neg liver ROS, hiatal hernia, PUD, GERD  Medicated and Controlled,  Endo/Other  negative endocrine ROS  Renal/GU negative Renal ROS     Musculoskeletal negative musculoskeletal ROS (+)   Abdominal (+) + obese,   Peds  Hematology  (+) anemia , HLD   Anesthesia Other Findings a fib  Reproductive/Obstetrics                             Anesthesia Physical Anesthesia Plan  ASA: IV  Anesthesia Plan: General   Post-op Pain Management:    Induction: Intravenous  PONV Risk Score and Plan: 3 and Propofol infusion and Treatment may vary due to age or medical condition  Airway Management Planned: Mask  Additional Equipment:   Intra-op Plan:   Post-operative Plan:   Informed Consent: I have reviewed the patients History and Physical, chart, labs and discussed the procedure including the risks, benefits and alternatives for the proposed anesthesia with the patient or authorized representative who has indicated his/her understanding and acceptance.   Dental advisory given  Plan Discussed with: CRNA  Anesthesia Plan Comments:         Anesthesia Quick Evaluation

## 2018-04-18 NOTE — Progress Notes (Signed)
Pt had infiltrated iv with amiodarone, iv removed with a lot of infltrate expressed, denies tenderness, unsuccessful  Iv attempt, pt reports usually gets iv team, stat iv team consult placed,

## 2018-04-18 NOTE — H&P (View-Only) (Signed)
  Advanced Heart Failure Rounding Note  PCP-Cardiologist: No primary care provider on file.   Subjective:    I/O -250 mls yesterday with 40 mg IV lasix x3. Weight unchanged. Creatinine stable 1.31, K 4.2.   Started on IV amio for afib yesterday. Remains in afib, but rates much better this morning 70-80s. Going for DCCV today at 2 pm.  Feels weak. Still SOB just talking. Had blood in her urine this morning. Add CBC.  Objective:   Weight Range: 88.1 kg Body mass index is 31.36 kg/m.   Vital Signs:   Temp:  [97.3 F (36.3 C)-97.9 F (36.6 C)] 97.3 F (36.3 C) (09/25 0454) Pulse Rate:  [61-86] 61 (09/25 0454) Resp:  [16-22] 18 (09/25 0454) BP: (99-124)/(50-78) 117/50 (09/25 0454) SpO2:  [98 %-100 %] 100 % (09/25 0454) Weight:  [88.1 kg-88.4 kg] 88.1 kg (09/25 0500) Last BM Date: 04/17/18  Weight change: Filed Weights   04/17/18 0357 04/18/18 0426 04/18/18 0500  Weight: 86.8 kg 88.4 kg 88.1 kg    Intake/Output:   Intake/Output Summary (Last 24 hours) at 04/18/2018 0843 Last data filed at 04/18/2018 0810 Gross per 24 hour  Intake 872.78 ml  Output 1500 ml  Net -627.22 ml      Physical Exam    General:  Elderly, frail. SOB with talking. HEENT: Normal Neck: Supple. JVP to jaw. Carotids 2+ bilat; no bruits. No lymphadenopathy or thyromegaly appreciated. Cor: PMI nondisplaced. irregular rate & rhythm. 2/6 SEM RUSB with clear S2 Lungs: diminished throughout. On 4 L [place] Abdomen: Soft, nontender, nondistended. No hepatosplenomegaly. No bruits or masses. Good bowel sounds. Extremities: No cyanosis, clubbing, rash, 1+ ankle edema. Neuro: Alert & orientedx3, cranial nerves grossly intact. moves all 4 extremities w/o difficulty. Affect pleasant   Telemetry   Afib 70-80s  EKG    No new tracings.  Labs    CBC Recent Labs    04/16/18 1039  WBC 9.0  NEUTROABS 6.7  HGB 10.7*  HCT 37.0  MCV 101.1*  PLT 223   Basic Metabolic Panel Recent Labs     04/17/18 0238 04/18/18 0445  NA 143 143  K 5.0 4.2  CL 95* 95*  CO2 33* 39*  GLUCOSE 150* 102*  BUN 23 31*  CREATININE 1.34* 1.31*  CALCIUM 9.2 10.0   Liver Function Tests Recent Labs    04/16/18 1039  AST 15  ALT 11  ALKPHOS 63  BILITOT 0.4  PROT 6.6  ALBUMIN 3.1*   No results for input(s): LIPASE, AMYLASE in the last 72 hours. Cardiac Enzymes Recent Labs    04/16/18 1551 04/16/18 2020 04/17/18 0238  TROPONINI 0.03* <0.03 0.03*    BNP: BNP (last 3 results) Recent Labs    02/21/18 1717 03/22/18 1514 04/16/18 1039  BNP 252.7* 357.1* 745.9*    ProBNP (last 3 results) No results for input(s): PROBNP in the last 8760 hours.   D-Dimer No results for input(s): DDIMER in the last 72 hours. Hemoglobin A1C No results for input(s): HGBA1C in the last 72 hours. Fasting Lipid Panel No results for input(s): CHOL, HDL, LDLCALC, TRIG, CHOLHDL, LDLDIRECT in the last 72 hours. Thyroid Function Tests No results for input(s): TSH, T4TOTAL, T3FREE, THYROIDAB in the last 72 hours.  Invalid input(s): FREET3  Other results:   Imaging     No results found.   Medications:     Scheduled Medications: . calcium-vitamin D  2 tablet Oral Daily  . clonazePAM  0.5 mg Oral QHS  .   diclofenac sodium  2 g Topical QID  . ferrous gluconate  324 mg Oral Daily  . furosemide  40 mg Intravenous Q12H  . isosorbide mononitrate  60 mg Oral Daily  . loratadine  10 mg Oral Daily  . metoprolol tartrate  50 mg Oral BID  . pantoprazole  40 mg Oral Daily  . potassium chloride SA  40 mEq Oral BID  . Rivaroxaban  15 mg Oral Q supper  . simvastatin  60 mg Oral q1800  . sodium chloride flush  3 mL Intravenous Q12H  . spironolactone  25 mg Oral Daily     Infusions: . sodium chloride    . amiodarone 30 mg/hr (04/18/18 0322)     PRN Medications:  acetaminophen, benzonatate, bisacodyl, butalbital-acetaminophen-caffeine, docusate sodium, ipratropium, nitroGLYCERIN, oxymetazoline,  sodium chloride flush    Patient Profile   [name] is a 77 y.o. female with a history of chronic diastolic CHF, moderate pulmonary HTN, HTN, CAD s/p CABG, COPD on home oxygen, and dyslipidemia. Well known to the HF clinic  Admitted 04/16/18 for SOB and c/o nose bleeds. Hgb stable. BNP and volume status elevated.   Assessment/Plan   1. 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 Suspect significant RV failure in setting of moderate mitral stenosis and severe TR. She has been very difficult to manage due to combination of CHF, valvular heart disease and severe COPD. Normal CO on RHC 02/26/18. - Volume status remains elevated with poor response to IV lasix. Creatinine stable. Tolerates Afib very poorly. - Increase lasix to 80 mg IV BID. - Continuespironolactone 25 mg daily.  - She is followed by [facility] with diuretic protocol in place. Discussed that we may need to use IV lasix if weight does not start trending down.  2. CAD:S/P CABG. -She has potential source of angina from 90% stenosis in distal LCx. The vessel is small at this point and not amenable to PCI. - No s/s ischemia. - Continue Imdur 90 mg daily.  - Continue statin.  - No ASA given Xarelto use.  3. Atrial fibrillation: Paroxysmal. -She is in rate controlled Afib. Has felt bad for the past week. - Continue IV amiodarone. Plan for DCCV today. She has not missed any Xarelto.  - Continue lopressor 50 mg BID 4.COPD - PFTs in 2/17 with moderate to severe obstruction.  - Continue chronic O2 2-3 lpm. No change.  -Followswith Dr Young 5. 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 She also has mild to moderate MR.Suspect that the MR makes her a poor  candidate for mitral valvuloplasty, and she also would not tolerate anesthesia well with severe COPD. She also would be a poor surgical candidate due to frailty and severe COPD.Suspectour only choice here will be medical management. - No change. 6. Pulmonary HTN:  -This is primarily pulmonary venous hypertension based on last RHC. - Continue diuresis.  7. OSA - Intolerant of CPAP.Encouraged her to find CPAP that works for her.  8. Anemia - Stable on admit. Per primary. Had blood in urine this morning. Check CBC today.  Poor response to IV lasix yesterday. Will increase to 80 mg IV bid. DCCV at 14:00 today. Check CBC with blood in urine.  Medication concerns reviewed with patient and pharmacy team. Barriers identified:   None at this time.   Length of Stay: 1  Ashley M Smith, NP  04/18/2018, 8:43 AM  Advanced Heart Failure Team Pager [phone] (M-F; 7a - 4p)  Please contact [facility] for night-coverage after hours (4p -7a ) and weekends on [email or web address]  Patient seen with NP, agree with the above note.   She remains in atrial fibrillation today, rate in 70s-80s.  She is short of breath and somewhat tachypneic today.  Creatinine stable.  TnI 0.03 with no trend.   On exam, tachypneic.  JVP 10-12 cm.  Heart irreg S1S2 with 2/6 SEM RUSB.  Trace ankle edema.   1. Acute on chronic diastolic CHF: Echo in 7/19 with EF 60-65%, moderate LVH, rheumatic MV with moderate MS (mean gradient 8, MVA 1.58 cm^2), mild-moderate MR, severe TR, PASP 76 mmHg, D-shaped interventricular septum with mildly dilated RV. Suspect significant RV failure in setting of moderate mitral stenosis and severe TR. She has been very difficult to manage due to combination of CHF, valvular heart disease and severe COPD. She was admitted again with dyspnea/volume overload and in atrial fibrillation, which likely triggered this admission.  On exam today, she is volume overloaded and tachypneic. Creatinine stable at 1.3.     - Increase Lasix to 80 mg IV bid, will give a dose now. - spironolactone 25 mg daily.  - Need to get her back in NSR, see below.  2. CAD:S/P CABG.She has potential source of angina from 90% stenosis in distal LCx on last cath. The vessel is small at this point and not amenable to PCI.TnI 0.03 this admission with no trend, suspect demand ischemia with volume overload.  - Continue Imdur.  - Continue statin.  - No ASA given Xarelto use.  3. Atrial fibrillation: Recurrent this admission.  Rate is controlled in the 70s.  I am concerned that the onset of atrial fibrillation has triggered her CHF exacerbation.  - Had been on amiodarone at home, now on amiodarone gtt.   - She has been on Xarelto > 3 wks without missing a dose.  - Will plan for DCCV today.  4. 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 She is not wheezing. Continue home oxygen.   5. 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 She also has mild to moderate MR. I suspect that the MR makes her a poor candidate for mitral valvuloplasty, and she also would not tolerate anesthesia well with severe COPD. She also would be a poor surgical candidate due to frailty and severe COPD. I think our only choice here will be medical management.  6. Lung nodule: Concerning for malignancy, plan for PET as outpatient. 7. Hematuria: UA done today looks like UTI.  Per primary team.   Ayvin Lipinski 04/18/2018 10:44 AM    

## 2018-04-18 NOTE — CV Procedure (Signed)
Electrical Cardioversion Procedure Note [name] [id number] Jan 28, 1941  Procedure: Electrical Cardioversion Indications:  Atrial Fibrillation  Procedure Details Consent: Risks of procedure as well as the alternatives and risks of each were explained to the (patient/caregiver).  Consent for procedure obtained. Time Out: Verified patient identification, verified procedure, site/side was marked, verified correct patient position, special equipment/implants available, medications/allergies/relevent history reviewed, required imaging and test results available.  Performed  Patient placed on cardiac monitor, pulse oximetry, supplemental oxygen as necessary.  Sedation given: propofol Pacer pads placed anterior and posterior chest.  Cardioverted 1 time(s).  Cardioverted at 150J.  Evaluation Findings: Post procedure EKG shows: sinus bradycardia with PVCs Complications: None Patient did tolerate procedure well. She received phenylephrine for blood pressure support.   Chilton Si, MD 04/18/2018, 2:17 PM

## 2018-04-18 NOTE — Progress Notes (Signed)
PROGRESS NOTE    [name]  RUE:[id number] DOB: 1940/12/07 DOA: 04/16/2018 PCP: Maurice Small, MD    Brief Narrative: Brief Narrative:  77 year old married female, lives with spouse who is an amputee and wheelchair-bound, patient ambulates with the help of a walker, PMH of chronic diastolic CHF, moderate pulmonary HTN, HTN, HLD, CAD status post CABG, PAF on Xarelto, COPD, chronic hypoxic respiratory failure on home oxygen (2 L at rest and 3 L with activity), anxiety and depression, GERD, negative sleep study 02/22/2016, followed at the HF clinic, last seen in HF clinic 03/22/2018 when given extra dose of torsemide, presented to [facility] ED 04/16/2018 due to progressively worsening dyspnea, significantly decreased effort tolerance over the last month or 2, intermittent chest tightness and intermittent epistaxis.  Admitted for acute on chronic diastolic CHF, acute on chronic hypoxic respiratory failure. HF Cardiology consulted.   Assessment & Plan:   Active Problems:   Acute on chronic diastolic CHF (congestive heart failure) (HCC)   CHF exacerbation (HCC)   Acute on chronic respiratory failure with hypoxia (HCC)   AF (paroxysmal atrial fibrillation) (HCC)   CKD (chronic kidney disease), stage III (HCC)  1-Acute on chronic diastolic HF; exacerbation;  TTE 01/30/2018: LVEF 60-65%, unable to assess LV diastolic function, RV volume overload, moderate MS severe TR and PA peak pressure 62 mmHg Right ventricular failure due to Moderate MS and severe TR.  On IV lasix, increase to 80 BID IV.  Plan for cardioversion today.    2-UTI; UA with 12-20 WBC, hematuria.  Follow urine culture.  Start ceftriaxone.   3-COPD;  On ipratropium .  Does not tolerates albuterol.   4-Headache;  For last week.  ? Worse after ipratropium . Discussed with pulmonary , could try Spiriva, ellipta. Patient report intolerance to those medications.  CT head negative.   Acute on chronic hypoxic respiratory  failure Multifactorial.  On IV lasix.   Paroxysmal A fib;  DCCV today.  Was on amiodarone gtt.  On xarelto.   Epistaxis: Intermittent, likely due to Xarelto and home oxygen use.  Insure humidified oxygen.  If worsens, may need ENT consultation.  CAD status post CABG: Per cardiology, 90% stenosis and distal LCx, not amenable to PCI.  Currently without anginal symptoms.  Troponins minimally elevated in the 0.03 range and flat trend.  Continue Imdur, metoprolol and simvastatin.  No aspirin due to Xarelto  Rheumatic mitral stenosis, moderate: As per cardiology, poor candidate for mitral valvuloplasty or surgery.  Hence medical management.  Stage III CKD; baseline cr 1.3.  Monitor.     DVT prophylaxis: xarelto Code Status: DNR Family Communication: granddaughter  Disposition Plan: cardioversion today, IV lasix.   Consultants:   Cardiology    Procedures:   Cardioversion 9-25   Antimicrobials:   none   Subjective: She is complaining of headache, occipital, radiates to her neck./    Objective: Vitals:   04/18/18 0139 04/18/18 0426 04/18/18 0454 04/18/18 0500  BP: 108/75 (!) 108/51 (!) 117/50   Pulse: 74 66 61   Resp:  16 18   Temp: (!) 97.4 F (36.3 C) (!) 97.4 F (36.3 C) (!) 97.3 F (36.3 C)   TempSrc: Oral  Oral   SpO2: 100% 98% 100%   Weight:  88.4 kg  88.1 kg  Height:        Intake/Output Summary (Last 24 hours) at 04/18/2018 1117 Last data filed at 04/18/2018 1100 Gross per 24 hour  Intake 730.27 ml  Output 1350 ml  Net -619.73 ml   Filed Weights   04/17/18 0357 04/18/18 0426 04/18/18 0500  Weight: 86.8 kg 88.4 kg 88.1 kg    Examination:  General exam: appears in pain  Respiratory system: Bilateral crackles.  Cardiovascular system: S1 & S2 heard, RRR. No JVD, murmurs, rubs, gallops or clicks. No pedal edema. Gastrointestinal system: Abdomen is nondistended, soft and nontender. No organomegaly or masses felt. Normal bowel sounds  heard. Central nervous system: Alert and oriented. No focal neurological deficits. Extremities: Symmetric 5 x 5 power. Skin: No rashes, lesions or ulcers Psychiatry: Judgement and insight appear normal. Mood & affect appropriate.     Data Reviewed: I have personally reviewed following labs and imaging studies  CBC: Recent Labs  Lab 04/16/18 1039  WBC 9.0  NEUTROABS 6.7  HGB 10.7*  HCT 37.0  MCV 101.1*  PLT 223   Basic Metabolic Panel: Recent Labs  Lab 04/16/18 1039 04/17/18 0238 04/18/18 0445  NA 142 143 143  K 3.8 5.0 4.2  CL 93* 95* 95*  CO2 34* 33* 39*  GLUCOSE 102* 150* 102*  BUN 19 23 31*  CREATININE 1.31* 1.34* 1.31*  CALCIUM 9.1 9.2 10.0   GFR: Estimated Creatinine Clearance: 39.6 mL/min (A) (by C-G formula based on SCr of 1.31 mg/dL (H)). Liver Function Tests: Recent Labs  Lab 04/16/18 1039  AST 15  ALT 11  ALKPHOS 63  BILITOT 0.4  PROT 6.6  ALBUMIN 3.1*   No results for input(s): LIPASE, AMYLASE in the last 168 hours. No results for input(s): AMMONIA in the last 168 hours. Coagulation Profile: No results for input(s): INR, PROTIME in the last 168 hours. Cardiac Enzymes: Recent Labs  Lab 04/16/18 1039 04/16/18 1551 04/16/18 2020 04/17/18 0238  TROPONINI 0.03* 0.03* <0.03 0.03*   BNP (last 3 results) No results for input(s): PROBNP in the last 8760 hours. HbA1C: No results for input(s): HGBA1C in the last 72 hours. CBG: No results for input(s): GLUCAP in the last 168 hours. Lipid Profile: No results for input(s): CHOL, HDL, LDLCALC, TRIG, CHOLHDL, LDLDIRECT in the last 72 hours. Thyroid Function Tests: No results for input(s): TSH, T4TOTAL, FREET4, T3FREE, THYROIDAB in the last 72 hours. Anemia Panel: No results for input(s): VITAMINB12, FOLATE, FERRITIN, TIBC, IRON, RETICCTPCT in the last 72 hours. Sepsis Labs: No results for input(s): PROCALCITON, LATICACIDVEN in the last 168 hours.  No results found for this or any previous visit  (from the past 240 hour(s)).       Radiology Studies: No results found.      Scheduled Meds: . calcium-vitamin D  2 tablet Oral Daily  . clonazePAM  0.5 mg Oral QHS  . diclofenac sodium  2 g Topical QID  . ferrous gluconate  324 mg Oral Daily  . furosemide  80 mg Intravenous BID  . isosorbide mononitrate  60 mg Oral Daily  . loratadine  10 mg Oral Daily  . metoprolol tartrate  50 mg Oral BID  . pantoprazole  40 mg Oral Daily  . potassium chloride SA  40 mEq Oral BID  . Rivaroxaban  15 mg Oral Q supper  . simvastatin  20 mg Oral q1800  . sodium chloride flush  3 mL Intravenous Q12H  . spironolactone  25 mg Oral Daily   Continuous Infusions: . sodium chloride    . amiodarone 30 mg/hr (04/18/18 0322)  . cefTRIAXone (ROCEPHIN)  IV       LOS: 1 day    Time spent: 35 minutes  Alba Cory, MD [facility] Pager [phone]  If 7PM-7AM, please contact night-coverage [email or web address] Password [facility] 04/18/2018, 11:17 AM

## 2018-04-18 NOTE — Progress Notes (Addendum)
Advanced Heart Failure Rounding Note  PCP-Cardiologist: No primary care provider on file.   Subjective:    I/O -250 mls yesterday with 40 mg IV lasix x3. Weight unchanged. Creatinine stable 1.31, K 4.2.   Started on IV amio for afib yesterday. Remains in afib, but rates much better this morning 70-80s. Going for DCCV today at 2 pm.  Feels weak. Still SOB just talking. Had blood in her urine this morning. Add CBC.  Objective:   Weight Range: 88.1 kg Body mass index is 31.36 kg/m.   Vital Signs:   Temp:  [97.3 F (36.3 C)-97.9 F (36.6 C)] 97.3 F (36.3 C) (09/25 0454) Pulse Rate:  [61-86] 61 (09/25 0454) Resp:  [16-22] 18 (09/25 0454) BP: (99-124)/(50-78) 117/50 (09/25 0454) SpO2:  [98 %-100 %] 100 % (09/25 0454) Weight:  [88.1 kg-88.4 kg] 88.1 kg (09/25 0500) Last BM Date: 04/17/18  Weight change: Filed Weights   04/17/18 0357 04/18/18 0426 04/18/18 0500  Weight: 86.8 kg 88.4 kg 88.1 kg    Intake/Output:   Intake/Output Summary (Last 24 hours) at 04/18/2018 0843 Last data filed at 04/18/2018 0810 Gross per 24 hour  Intake 872.78 ml  Output 1500 ml  Net -627.22 ml      Physical Exam    General:  Elderly, frail. SOB with talking. HEENT: Normal Neck: Supple. JVP to jaw. Carotids 2+ bilat; no bruits. No lymphadenopathy or thyromegaly appreciated. Cor: PMI nondisplaced. irregular rate & rhythm. 2/6 SEM RUSB with clear S2 Lungs: diminished throughout. On 4 L [place] Abdomen: Soft, nontender, nondistended. No hepatosplenomegaly. No bruits or masses. Good bowel sounds. Extremities: No cyanosis, clubbing, rash, 1+ ankle edema. Neuro: Alert & orientedx3, cranial nerves grossly intact. moves all 4 extremities w/o difficulty. Affect pleasant   Telemetry   Afib 70-80s  EKG    No new tracings.  Labs    CBC Recent Labs    04/16/18 1039  WBC 9.0  NEUTROABS 6.7  HGB 10.7*  HCT 37.0  MCV 101.1*  PLT 223   Basic Metabolic Panel Recent Labs     04/17/18 0238 04/18/18 0445  NA 143 143  K 5.0 4.2  CL 95* 95*  CO2 33* 39*  GLUCOSE 150* 102*  BUN 23 31*  CREATININE 1.34* 1.31*  CALCIUM 9.2 10.0   Liver Function Tests Recent Labs    04/16/18 1039  AST 15  ALT 11  ALKPHOS 63  BILITOT 0.4  PROT 6.6  ALBUMIN 3.1*   No results for input(s): LIPASE, AMYLASE in the last 72 hours. Cardiac Enzymes Recent Labs    04/16/18 1551 04/16/18 2020 04/17/18 0238  TROPONINI 0.03* <0.03 0.03*    BNP: BNP (last 3 results) Recent Labs    02/21/18 1717 03/22/18 1514 04/16/18 1039  BNP 252.7* 357.1* 745.9*    ProBNP (last 3 results) No results for input(s): PROBNP in the last 8760 hours.   D-Dimer No results for input(s): DDIMER in the last 72 hours. Hemoglobin A1C No results for input(s): HGBA1C in the last 72 hours. Fasting Lipid Panel No results for input(s): CHOL, HDL, LDLCALC, TRIG, CHOLHDL, LDLDIRECT in the last 72 hours. Thyroid Function Tests No results for input(s): TSH, T4TOTAL, T3FREE, THYROIDAB in the last 72 hours.  Invalid input(s): FREET3  Other results:   Imaging     No results found.   Medications:     Scheduled Medications: . calcium-vitamin D  2 tablet Oral Daily  . clonazePAM  0.5 mg Oral QHS  .  diclofenac sodium  2 g Topical QID  . ferrous gluconate  324 mg Oral Daily  . furosemide  40 mg Intravenous Q12H  . isosorbide mononitrate  60 mg Oral Daily  . loratadine  10 mg Oral Daily  . metoprolol tartrate  50 mg Oral BID  . pantoprazole  40 mg Oral Daily  . potassium chloride SA  40 mEq Oral BID  . Rivaroxaban  15 mg Oral Q supper  . simvastatin  60 mg Oral q1800  . sodium chloride flush  3 mL Intravenous Q12H  . spironolactone  25 mg Oral Daily     Infusions: . sodium chloride    . amiodarone 30 mg/hr (04/18/18 0322)     PRN Medications:  acetaminophen, benzonatate, bisacodyl, butalbital-acetaminophen-caffeine, docusate sodium, ipratropium, nitroGLYCERIN, oxymetazoline,  sodium chloride flush    Patient Profile   [name] is a 77 y.o. female with a history of chronic diastolic CHF, moderate pulmonary HTN, HTN, CAD s/p CABG, COPD on home oxygen, and dyslipidemia. Well known to the HF clinic  Admitted 04/16/18 for SOB and c/o nose bleeds. Hgb stable. BNP and volume status elevated.   Assessment/Plan   1. 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 Suspect significant RV failure in setting of moderate mitral stenosis and severe TR. She has been very difficult to manage due to combination of CHF, valvular heart disease and severe COPD. Normal CO on RHC 02/26/18. - Volume status remains elevated with poor response to IV lasix. Creatinine stable. Tolerates Afib very poorly. - Increase lasix to 80 mg IV BID. - Continuespironolactone 25 mg daily.  - She is followed by [facility] with diuretic protocol in place. Discussed that we may need to use IV lasix if weight does not start trending down.  2. CAD:S/P CABG. -She has potential source of angina from 90% stenosis in distal LCx. The vessel is small at this point and not amenable to PCI. - No s/s ischemia. - Continue Imdur 90 mg daily.  - Continue statin.  - No ASA given Xarelto use.  3. Atrial fibrillation: Paroxysmal. -She is in rate controlled Afib. Has felt bad for the past week. - Continue IV amiodarone. Plan for DCCV today. She has not missed any Xarelto.  - Continue lopressor 50 mg BID 4.COPD - PFTs in 2/17 with moderate to severe obstruction.  - Continue chronic O2 2-3 lpm. No change.  -Followswith Dr Maple [place] 5. 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 She also has mild to moderate MR.Suspect that the MR makes her a poor  candidate for mitral valvuloplasty, and she also would not tolerate anesthesia well with severe COPD. She also would be a poor surgical candidate due to frailty and severe COPD.Suspectour only choice here will be medical management. - No change. 6. Pulmonary HTN:  -This is primarily pulmonary venous hypertension based on last RHC. - Continue diuresis.  7. OSA - Intolerant of CPAP.Encouraged her to find CPAP that works for her.  8. Anemia - Stable on admit. Per primary. Had blood in urine this morning. Check CBC today.  Poor response to IV lasix yesterday. Will increase to 80 mg IV bid. DCCV at 14:00 today. Check CBC with blood in urine.  Medication concerns reviewed with patient and pharmacy team. Barriers identified:  None at this time.   Length of Stay: 1  Alford Highland, NP  04/18/2018, 8:43 AM  Advanced Heart Failure Team Pager [phone] (M-F; 7a - 4p)  Please contact [facility] for night-coverage after hours (4p -7a ) and weekends on [email or web address]  Patient seen with NP, agree with the above note.   She remains in atrial fibrillation today, rate in 70s-80s.  She is short of breath and somewhat tachypneic today.  Creatinine stable.  TnI 0.03 with no trend.   On exam, tachypneic.  JVP 10-12 cm.  Heart irreg S1S2 with 2/6 SEM RUSB.  Trace ankle edema.   1. Acute on chronic diastolic CHF: Echo in 7/19 with EF 60-65%, moderate LVH, rheumatic MV with moderate MS (mean gradient 8, MVA 1.58 cm^2), mild-moderate MR, severe TR, PASP 76 mmHg, D-shaped interventricular septum with mildly dilated RV. Suspect significant RV failure in setting of moderate mitral stenosis and severe TR. She has been very difficult to manage due to combination of CHF, valvular heart disease and severe COPD. She was admitted again with dyspnea/volume overload and in atrial fibrillation, which likely triggered this admission.  On exam today, she is volume overloaded and tachypneic. Creatinine stable at 1.3.     - Increase Lasix to 80 mg IV bid, will give a dose now. - spironolactone 25 mg daily.  - Need to get her back in NSR, see below.  2. CAD:S/P CABG.She has potential source of angina from 90% stenosis in distal LCx on last cath. The vessel is small at this point and not amenable to PCI.TnI 0.03 this admission with no trend, suspect demand ischemia with volume overload.  - Continue Imdur.  - Continue statin.  - No ASA given Xarelto use.  3. Atrial fibrillation: Recurrent this admission.  Rate is controlled in the 70s.  I am concerned that the onset of atrial fibrillation has triggered her CHF exacerbation.  - Had been on amiodarone at home, now on amiodarone gtt.   - She has been on Xarelto > 3 wks without missing a dose.  - Will plan for DCCV today.  4. 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 She is not wheezing. Continue home oxygen.   5. 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 She also has mild to moderate MR. I suspect that the MR makes her a poor candidate for mitral valvuloplasty, and she also would not tolerate anesthesia well with severe COPD. She also would be a poor surgical candidate due to frailty and severe COPD. I think our only choice here will be medical management.  6. Lung nodule: Concerning for malignancy, plan for PET as outpatient. 7. Hematuria: UA done today looks like UTI.  Per primary team.   Marca Ancona 04/18/2018 10:44 AM

## 2018-04-18 NOTE — Anesthesia Postprocedure Evaluation (Signed)
Anesthesia Post Note  Patient: NATEA TAMEZ  Procedure(s) Performed: CARDIOVERSION (N/A )     Patient location during evaluation: PACU Anesthesia Type: General Level of consciousness: awake Pain management: pain level controlled Vital Signs Assessment: post-procedure vital signs reviewed and stable Respiratory status: spontaneous breathing, nonlabored ventilation, respiratory function stable and patient connected to nasal cannula oxygen Cardiovascular status: blood pressure returned to baseline and stable Postop Assessment: no apparent nausea or vomiting Anesthetic complications: no    Last Vitals:  Vitals:   04/18/18 1935 04/18/18 1938  BP:  (!) 137/45  Pulse:  62  Resp:  16  Temp:  (!) 36.1 C  SpO2: 94% 94%    Last Pain:  Vitals:   04/18/18 1955  TempSrc:   PainSc: 0-No pain                 Ryan P Ellender

## 2018-04-18 NOTE — Transfer of Care (Signed)
Immediate Anesthesia Transfer of Care Note  Patient: [name]  Procedure(s) Performed: CARDIOVERSION (N/A )  Patient Location: Endoscopy Unit  Anesthesia Type:General  Level of Consciousness: awake, alert , oriented and patient cooperative  Airway & Oxygen Therapy: Patient Spontanous Breathing and Patient connected to nasal cannula oxygen  Post-op Assessment: Report given to RN and Post -op Vital signs reviewed and stable  Post vital signs: Reviewed and stable  Last Vitals:  Vitals Value Taken Time  BP    Temp    Pulse    Resp    SpO2      Last Pain:  Vitals:   04/18/18 1318  TempSrc: Axillary  PainSc:       Patients Stated Pain Goal: 0 (04/18/18 1100)  Complications: No apparent anesthesia complications

## 2018-04-19 ENCOUNTER — Inpatient Hospital Stay (HOSPITAL_COMMUNITY): Payer: Medicare Other

## 2018-04-19 ENCOUNTER — Telehealth: Payer: Self-pay | Admitting: Internal Medicine

## 2018-04-19 ENCOUNTER — Encounter (HOSPITAL_COMMUNITY): Payer: Medicare Other | Admitting: Cardiology

## 2018-04-19 ENCOUNTER — Encounter (HOSPITAL_COMMUNITY): Payer: Self-pay | Admitting: Cardiovascular Disease

## 2018-04-19 LAB — CBC
HCT: 37.2 % (ref 36.0–46.0)
Hemoglobin: 10.8 g/dL — ABNORMAL LOW (ref 12.0–15.0)
MCH: 29.8 pg (ref 26.0–34.0)
MCHC: 29 g/dL — ABNORMAL LOW (ref 30.0–36.0)
MCV: 102.5 fL — ABNORMAL HIGH (ref 78.0–100.0)
PLATELETS: 213 10*3/uL (ref 150–400)
RBC: 3.63 MIL/uL — AB (ref 3.87–5.11)
RDW: 12.8 % (ref 11.5–15.5)
WBC: 9.7 10*3/uL (ref 4.0–10.5)

## 2018-04-19 LAB — BASIC METABOLIC PANEL
ANION GAP: 10 (ref 5–15)
BUN: 29 mg/dL — ABNORMAL HIGH (ref 8–23)
CALCIUM: 9.4 mg/dL (ref 8.9–10.3)
CO2: 39 mmol/L — ABNORMAL HIGH (ref 22–32)
Chloride: 96 mmol/L — ABNORMAL LOW (ref 98–111)
Creatinine, Ser: 1.28 mg/dL — ABNORMAL HIGH (ref 0.44–1.00)
GFR calc Af Amer: 45 mL/min — ABNORMAL LOW (ref >60)
GFR, EST NON AFRICAN AMERICAN: 39 mL/min — AB (ref >60)
GLUCOSE: 108 mg/dL — AB (ref 70–99)
Potassium: 4.7 mmol/L (ref 3.5–5.1)
SODIUM: 145 mmol/L (ref 135–145)

## 2018-04-19 LAB — URINE CULTURE

## 2018-04-19 LAB — MRSA PCR SCREENING: MRSA BY PCR: NEGATIVE

## 2018-04-19 MED ORDER — AMIODARONE HCL 200 MG PO TABS
200.0000 mg | ORAL_TABLET | Freq: Every day | ORAL | Status: DC
  Administered 2018-04-19 – 2018-04-24 (×6): 200 mg via ORAL
  Filled 2018-04-19 (×7): qty 1

## 2018-04-19 MED ORDER — METOPROLOL TARTRATE 25 MG PO TABS
25.0000 mg | ORAL_TABLET | Freq: Two times a day (BID) | ORAL | Status: DC
  Administered 2018-04-19 – 2018-04-24 (×11): 25 mg via ORAL
  Filled 2018-04-19 (×12): qty 1

## 2018-04-19 MED ORDER — CLONAZEPAM 0.5 MG PO TABS
0.5000 mg | ORAL_TABLET | Freq: Every day | ORAL | Status: DC | PRN
  Administered 2018-04-19 – 2018-04-22 (×3): 0.5 mg via ORAL
  Filled 2018-04-19 (×3): qty 1

## 2018-04-19 MED ORDER — METOLAZONE 2.5 MG PO TABS
2.5000 mg | ORAL_TABLET | Freq: Once | ORAL | Status: AC
  Administered 2018-04-19: 2.5 mg via ORAL
  Filled 2018-04-19: qty 1

## 2018-04-19 NOTE — Progress Notes (Signed)
PT Cancellation Note  Patient Details Name: PHILIPPINE NEWVINE MRN: [id number] DOB: 1941/05/31   Cancelled Treatment:    Reason Eval/Treat Not Completed: Patient declined, no reason specified(pt reports SoB and fatigue and declined mobility at this time)   Enedina Finner [place] 04/19/2018, 10:30 AM  [name], PT Acute Rehabilitation Services Pager: [phone] Office: [phone]

## 2018-04-19 NOTE — Plan of Care (Signed)
  Problem: Health Behavior/Discharge Planning: Goal: Ability to manage health-related needs will improve Outcome: Progressing   Problem: Clinical Measurements: Goal: Ability to maintain clinical measurements within normal limits will improve Outcome: Progressing   Problem: Clinical Measurements: Goal: Respiratory complications will improve Outcome: Progressing   Problem: Nutrition: Goal: Adequate nutrition will be maintained Outcome: Progressing   Problem: Elimination: Goal: Will not experience complications related to urinary retention Outcome: Progressing   Problem: Pain Managment: Goal: General experience of comfort will improve Outcome: Progressing

## 2018-04-19 NOTE — Progress Notes (Addendum)
Advanced Heart Failure Rounding Note  PCP-Cardiologist: No primary care provider on file.   Subjective:    I/O -350 mls yesterday with 80 mg IV lasix BID. Weight down 2 lbs. Creatinine stable 1.28, K 4.7.   Underwent successful DCCV 9/25. Complicated by hypotension and bradycardia afterward. Required phenylephrine temporarily. Now SB/NSR 50-60s. SBP 110s.  UA+. Now on Ceftriaxone.   Had a rough night. Continues to have hematuria. Hemoglobin stable 10.8. Remains SOB with talking, but she thinks it is at least partially anxiety related. Requiring 4 L O2.   Objective:   Weight Range: 87.2 kg Body mass index is 31.03 kg/m.   Vital Signs:   Temp:  [97 F (36.1 C)-98 F (36.7 C)] 98 F (36.7 C) (09/26 0522) Pulse Rate:  [56-75] 57 (09/26 0522) Resp:  [16-29] 18 (09/26 0522) BP: (89-137)/(31-77) 117/40 (09/26 0522) SpO2:  [94 %-100 %] 98 % (09/26 0522) Weight:  [87.2 kg] 87.2 kg (09/26 0522) Last BM Date: 04/14/18  Weight change: Filed Weights   04/18/18 0426 04/18/18 0500 04/19/18 0522  Weight: 88.4 kg 88.1 kg 87.2 kg    Intake/Output:   Intake/Output Summary (Last 24 hours) at 04/19/2018 0755 Last data filed at 04/19/2018 0100 Gross per 24 hour  Intake 1252.92 ml  Output 1600 ml  Net -347.08 ml      Physical Exam    General: Elderly appearing. SOB with talking. HEENT: Normal Neck: Supple. JVP 8-10. Carotids 2+ bilat; no bruits. No thyromegaly or nodule noted. Cor: PMI nondisplaced. RRR, 2/6 SEM RUSB with clear S2 Lungs: Clear. On 4 L [place] Abdomen: Soft, non-tender, non-distended, no HSM. No bruits or masses. +BS  Extremities: No cyanosis, clubbing, or rash. R and LLE no edema.  Neuro: Alert & orientedx3, cranial nerves grossly intact. moves all 4 extremities w/o difficulty. Affect pleasant  Telemetry   NSR/SB 50-60s. Personally reviewed.   EKG    No new tracings.  Labs    CBC Recent Labs    04/16/18 1039 04/19/18 0452  WBC 9.0 9.7  NEUTROABS  6.7  --   HGB 10.7* 10.8*  HCT 37.0 37.2  MCV 101.1* 102.5*  PLT 223 213   Basic Metabolic Panel Recent Labs    16/10/96 0445 04/19/18 0452  NA 143 145  K 4.2 4.7  CL 95* 96*  CO2 39* 39*  GLUCOSE 102* 108*  BUN 31* 29*  CREATININE 1.31* 1.28*  CALCIUM 10.0 9.4   Liver Function Tests Recent Labs    04/16/18 1039  AST 15  ALT 11  ALKPHOS 63  BILITOT 0.4  PROT 6.6  ALBUMIN 3.1*   No results for input(s): LIPASE, AMYLASE in the last 72 hours. Cardiac Enzymes Recent Labs    04/16/18 1551 04/16/18 2020 04/17/18 0238  TROPONINI 0.03* <0.03 0.03*    BNP: BNP (last 3 results) Recent Labs    02/21/18 1717 03/22/18 1514 04/16/18 1039  BNP 252.7* 357.1* 745.9*    ProBNP (last 3 results) No results for input(s): PROBNP in the last 8760 hours.   D-Dimer No results for input(s): DDIMER in the last 72 hours. Hemoglobin A1C No results for input(s): HGBA1C in the last 72 hours. Fasting Lipid Panel No results for input(s): CHOL, HDL, LDLCALC, TRIG, CHOLHDL, LDLDIRECT in the last 72 hours. Thyroid Function Tests No results for input(s): TSH, T4TOTAL, T3FREE, THYROIDAB in the last 72 hours.  Invalid input(s): FREET3  Other results:   Imaging    Ct Head Wo Contrast  Result Date: 04/18/2018 CLINICAL DATA:  Headache EXAM: CT HEAD WITHOUT CONTRAST TECHNIQUE: Contiguous axial images were obtained from the base of the skull through the vertex without intravenous contrast. COMPARISON:  10/19/2017 FINDINGS: Brain: No acute intracranial abnormality. Specifically, no hemorrhage, hydrocephalus, mass lesion, acute infarction, or significant intracranial injury. Mild age related volume loss. Vascular: No hyperdense vessel or unexpected calcification. Skull: No acute calvarial abnormality. Sinuses/Orbits: Visualized paranasal sinuses and mastoids clear. Orbital soft tissues unremarkable. Other: None IMPRESSION: No acute intracranial abnormality. Electronically Signed   By:  Charlett Nose M.D.   On: 04/18/2018 13:00     Medications:     Scheduled Medications: . calcium-vitamin D  2 tablet Oral Daily  . clonazePAM  0.5 mg Oral QHS  . diclofenac sodium  2 g Topical QID  . ferrous gluconate  324 mg Oral Daily  . furosemide  80 mg Intravenous BID  . ipratropium  0.5 mg Nebulization QID  . isosorbide mononitrate  60 mg Oral Daily  . loratadine  10 mg Oral Daily  . metoprolol tartrate  50 mg Oral BID  . pantoprazole  40 mg Oral Daily  . potassium chloride SA  40 mEq Oral BID  . Rivaroxaban  15 mg Oral Q supper  . simvastatin  20 mg Oral q1800  . sodium chloride flush  3 mL Intravenous Q12H  . spironolactone  25 mg Oral Daily    Infusions: . sodium chloride    . amiodarone Stopped (04/18/18 1415)  . cefTRIAXone (ROCEPHIN)  IV Stopped (04/18/18 1547)    PRN Medications: acetaminophen, benzonatate, bisacodyl, butalbital-acetaminophen-caffeine, docusate sodium, nitroGLYCERIN, oxymetazoline, sodium chloride flush    Patient Profile   [name] is a 77 y.o. female with a history of chronic diastolic CHF, moderate pulmonary HTN, HTN, CAD s/p CABG, COPD on home oxygen, and dyslipidemia. Well known to the HF clinic  Admitted 04/16/18 for SOB and c/o nose bleeds. Hgb stable. BNP and volume status elevated.   Assessment/Plan   1. 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 Suspect significant RV failure in setting of moderate mitral stenosis and severe TR. She has been very difficult to manage due to combination of CHF, valvular heart disease and severe COPD. Normal CO on RHC 02/26/18. - Volume status improving. Creatinine stable. She is still SOB.  - Continue lasix 80 mg IV BID. - Continuespironolactone 25 mg daily.    2. CAD:S/P CABG. -She has potential source of angina from 90% stenosis in distal LCx. The vessel is small at this point and not amenable to PCI. - No s/s ischemia. - Continue Imdur 90 mg daily.  - Continue statin.  - No ASA given Xarelto use.  3. Atrial fibrillation: Paroxysmal. - S/p DCCV 9/25. Remains in NSR.  - Continue Xarelto.  - Continue lopressor 50 mg BID - Amiodarone drip stopped post procedure with hypotension and bradycardia. Can likely start PO today.  4.COPD - PFTs in 2/17 with moderate to severe obstruction.  - Continue chronic O2 2-3 lpm. Currently on 4L [place]. -Followswith Dr Maple Hudson 5. 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 She also has mild to moderate MR.Suspect that the MR makes her a poor candidate for mitral valvuloplasty, and she also would not tolerate anesthesia well with severe COPD. She  also would be a poor surgical candidate due to frailty and severe COPD.Suspectour only choice here will be medical management. - No change.  6. Pulmonary HTN:  -This is primarily pulmonary venous hypertension based on last RHC. - Continue diuresis. No change.  7. OSA - Intolerant of CPAP.Encouraged her to find CPAP that works for her. No change.  8. Anemia - Hemoglobin stable.  9. UTI with hematuria - UA +. Continue ceftriaxone per primary team.  10. Lung nodule: Concerning for malignancy, plan for PET as outpatient.  Medication concerns reviewed with patient and pharmacy team. Barriers identified: None at this time.   Length of Stay: 2  Alford Highland, NP  04/19/2018, 7:55 AM  Advanced Heart Failure Team Pager [phone] (M-F; 7a - 4p)  Please contact [facility] for night-coverage after hours (4p -7a ) and weekends on [email or web address]  Patient seen with PA, agree with the above note.   She underwent DCCV yesterday and remains in NSR today.  Amiodarone gtt stopped due to bradycardia, HR in 60s now.  She is still short  of breath and is now on bipap.   She is on Bipap.  JVP 10-12 cm.  Heart reg S1S2 with 2/6 SEM RUSB.  Trace ankle edema.   1. Acute on chronic diastolic CHF: Echo in 7/19 with EF 60-65%, moderate LVH, rheumatic MV with moderate MS (mean gradient 8, MVA 1.58 cm^2), mild-moderate MR, severe TR, PASP 76 mmHg, D-shaped interventricular septum with mildly dilated RV. Suspect significant RV failure in setting of moderate mitral stenosis and severe TR. She has been very difficult to manage due to combination of CHF, valvular heart disease and severe COPD. She was admitted again with dyspnea/volume overload and in atrial fibrillation, which likely triggered this admission.  She is back in NSR today after DCCV.  Still short of breath and volume overloaded, on Bipap currently. Creatinine stable at 1.28.  -Continue Lasix 80 mg IV bid and will give dose of metolazone today.  - spironolactone 25 mg daily.  2. CAD:S/P CABG.She has potential source of angina from 90% stenosis in distal LCx on last cath. The vessel is small at this point and not amenable to PCI.TnI 0.03 this admission with no trend, suspect demand ischemia with volume overload.  - Continue Imdur.  - Continue statin.  - No ASA given Xarelto use.  3. Atrial fibrillation: Recurrent this admission.   I am concerned that the onset of atrial fibrillation has triggered her CHF exacerbation. She is now back in NSR after DCCV on 9/25.  - Restart amiodarone 200 mg daily.    -Continue Xarelto.  4. 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 She is not wheezing.  Currently on Bipap.  5. 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 She also has mild to moderate MR. I suspect that the MR makes her a poor candidate for mitral valvuloplasty, and she also would not tolerate anesthesia well with severe COPD. She also would be a poor surgical candidate due to frailty and severe COPD. I think our only choice here will be medical management.    6. Lung nodule: Concerning for malignancy, plan for PET as outpatient. 7. UTI: On ceftriaxone.  8. Anxiety: Benzodiazepines as needed.   Marca Ancona 04/19/2018 3:08 PM

## 2018-04-19 NOTE — Telephone Encounter (Signed)
The patient  is in the hospital with congestive heart failure. Dr. Sunnie Nielsen is wondering if there is a alterative medication for her to take for the  ipratropium (ATROVENT) 0.02 % nebulizer solution. She says this is causing headache for the patient.  She would like Dr. Maple Hudson to call at - Dr. Hartley Barefoot [phone]     Allergies as of 04/19/2018      Reactions   Levalbuterol Other (See Comments)   Made mouth and throat sore Made mouth and throat sore   Lisinopril Cough   Developed persistent dry cough for a month.    Tape Other (See Comments)   Bleeding   Cefpodoxime Other (See Comments)   Yeast infections    Codeine Other (See Comments)   Anxious/ jittery   Lipitor [atorvastatin] Other (See Comments)   Made joint start hurting/ Muscle aches   Other Swelling   Venholin HFA   Penicillins Other (See Comments)   childhood reaction - pt has tolerated ceftriaxone Has patient had a PCN reaction causing immediate rash, facial/tongue/throat swelling, SOB or lightheadedness with hypotension: unknown Has patient had a PCN reaction causing severe rash involving mucus membranes or skin necrosis: No Has patient had a PCN reaction that required hospitalization No Has patient had a PCN reaction occurring within the last 10 years: No If all of the above answers are "NO", then may proceed with Cephalosporin use.   Ventolin [kdc:albuterol] Other (See Comments)   "jittery"   Celexa [citalopram Hydrobromide]    Lyrica [pregabalin]    Anoro Ellipta [umeclidinium-vilanterol] Itching   Clindamycin Other (See Comments)   Unknown    Hydrocodone-acetaminophen Anxiety   Latex Itching, Rash      Medication List    Notice   This visit is during an admission. Changes to the med list made in this visit will be reflected in the After Visit Summary of the admission.      Dr. Hartley Barefoot [phone]

## 2018-04-19 NOTE — Progress Notes (Signed)
PROGRESS NOTE    [name]  ZOX:[id number] DOB: [birth year] DOA: 04/16/2018 PCP: Maurice Small, MD    Brief Narrative: Brief Narrative:  77 year old married female, lives with spouse who is an amputee and wheelchair-bound, patient ambulates with the help of a walker, PMH of chronic diastolic CHF, moderate pulmonary HTN, HTN, HLD, CAD status post CABG, PAF on Xarelto, COPD, chronic hypoxic respiratory failure on home oxygen (2 L at rest and 3 L with activity), anxiety and depression, GERD, negative sleep study 02/22/2016, followed at the HF clinic, last seen in HF clinic 03/22/2018 when given extra dose of torsemide, presented to [facility] ED 04/16/2018 due to progressively worsening dyspnea, significantly decreased effort tolerance over the last month or 2, intermittent chest tightness and intermittent epistaxis.  Admitted for acute on chronic diastolic CHF, acute on chronic hypoxic respiratory failure. HF Cardiology consulted.   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  On IV lasix, increase to 80 BID IV.  S/P cardioversion 9-25.  Patient more SOB , increase work of breathing. She will be transfer to step down on BIPAP, klonopin dose given.  Repeated chest x ray, stable changes.  Cardiology will order extra dose of metolazone.   2-UTI; UA with 12-20 WBC, hematuria.  Follow urine culture. Re send culture.  Continue with ceftriaxone.   3-COPD;  On ipratropium .  Does not  tolerates albuterol, xopenex.  Report no headaches today, has use ipratropium.  I spoke with Dr Maple Hudson, we could try Atrovent hfa. I spoke with pharmacy, that medication is restricted to pediatric population. I will continue nebulizer Atrovent for now, patient tolerating medication today.   4-Headache;  For last week.  ? Worse after ipratropium . Discussed with pulmonary , could try Spiriva, ellipta. Patient report intolerance to those medications.  CT head negative.  No headaches today.   Acute on chronic hypoxic respiratory failure Multifactorial.  On IV lasix.  Component of anxiety also.   Paroxysmal A fib;  DCCV 9-25 Was on amiodarone gtt.  On xarelto.   Epistaxis: Intermittent, likely due to Xarelto and home oxygen use.  Insure humidified oxygen.  If worsens, may need ENT consultation.  CAD status post CABG: Per cardiology, 90% stenosis and distal LCx, not amenable to PCI.  Currently without anginal symptoms.  Troponins minimally elevated in the 0.03 range and flat trend.  Continue Imdur, metoprolol and simvastatin.  No aspirin due to Xarelto  Rheumatic mitral stenosis, moderate: As per cardiology, poor candidate for mitral valvuloplasty or surgery.  Hence medical management.  Stage III CKD; baseline cr 1.3.  Monitor.     DVT prophylaxis: xarelto Code Status: DNR Family Communication: daughter at bedside.  Disposition Plan: Increase work of breathing today, place on BIPAP, IV lasix. Transfer to step down unit   Consultants:   Cardiology    Procedures:   Cardioversion 9-25   Antimicrobials:   none   Subjective: This morning patient is more Restless, more tachypnea. Didn't sleep well last  night  Report she cant breath.   granddaughter relates patient does better on 6 L oxygen, I explain to her because of COPD history  oxygen sat goal 90 %, avoid high oxygen supplementation. Will place patient on BIPAP to help with work of breathing.   Came to check on  patient this afternoon, patient is more comfortable on BIPAP. Marland Kitchen   Objective: Vitals:   04/18/18 2146 04/19/18 0522 04/19/18 0758 04/19/18 0843  BP: (!) 111/55 (!) 117/40  (!) 142/50  Pulse: 69 (!) 57  (!) 59  Resp:  18  (!) 26  Temp:  98 F (36.7 C)  98.6 F (37 C)  TempSrc:  Oral  Oral  SpO2:  98% 99% 94%  Weight:  87.2 kg    Height:        Intake/Output Summary (Last 24 hours) at 04/19/2018 1059 Last data filed at 04/19/2018 0939 Gross per 24 hour  Intake 1410.48 ml  Output 1100 ml  Net 310.48 ml   Filed Weights   04/18/18 0426 04/18/18 0500 04/19/18 0522  Weight: 88.4 kg 88.1 kg 87.2 kg    Examination:  General exam: anxious, increase work of breathing.  Respiratory system:  Increase work of breathing, bilateral crackles  Cardiovascular system: S 1, S 2 RRR Gastrointestinal system: BS present, soft, nt Central nervous system: non focal.  Extremities: symmetric power.  Skin: no rash      Data Reviewed: I have personally reviewed following labs and imaging studies  CBC: Recent Labs  Lab 04/16/18 1039 04/19/18 0452  WBC 9.0 9.7  NEUTROABS 6.7  --   HGB 10.7* 10.8*  HCT 37.0 37.2  MCV 101.1* 102.5*  PLT 223 213   Basic Metabolic Panel: Recent Labs  Lab 04/16/18 1039 04/17/18 0238 04/18/18 0445 04/19/18 0452  NA 142 143 143 145  K 3.8 5.0 4.2 4.7  CL 93* 95* 95* 96*  CO2 34* 33* 39* 39*  GLUCOSE 102* 150* 102* 108*  BUN 19 23 31* 29*  CREATININE 1.31* 1.34* 1.31* 1.28*  CALCIUM 9.1 9.2 10.0 9.4   GFR: Estimated Creatinine Clearance: 40.3 mL/min (A) (by C-G formula based on SCr of 1.28 mg/dL (H)). Liver Function Tests: Recent Labs  Lab 04/16/18 1039  AST 15  ALT 11  ALKPHOS 63  BILITOT 0.4  PROT 6.6  ALBUMIN 3.1*   No results for input(s): LIPASE, AMYLASE in the last 168 hours. No results for input(s): AMMONIA in the last 168 hours. Coagulation Profile: No results for input(s): INR, PROTIME in the last 168 hours. Cardiac  Enzymes: Recent Labs  Lab 04/16/18 1039 04/16/18 1551 04/16/18 2020 04/17/18 0238  TROPONINI 0.03* 0.03* <0.03 0.03*   BNP (last 3 results) No results for input(s): PROBNP in the last 8760 hours. HbA1C: No results for input(s): HGBA1C in the last 72 hours. CBG: No results for input(s): GLUCAP in the last 168 hours. Lipid Profile: No results for input(s): CHOL, HDL, LDLCALC, TRIG, CHOLHDL, LDLDIRECT in the last 72 hours. Thyroid Function Tests: No results for input(s): TSH, T4TOTAL, FREET4, T3FREE, THYROIDAB in the last 72 hours. Anemia Panel: No results for input(s): VITAMINB12, FOLATE, FERRITIN, TIBC, IRON, RETICCTPCT in the last 72 hours. Sepsis Labs: No results for input(s): PROCALCITON, LATICACIDVEN in the last 168 hours.  Recent Results (from the past 240 hour(s))  Urine Culture     Status: Abnormal   Collection Time: 04/18/18 10:54 AM  Result Value Ref Range Status   Specimen Description URINE, RANDOM  Final  Special Requests   Final    NONE Performed at [facility], [address], [place], [place] [address]    Culture MULTIPLE SPECIES PRESENT, SUGGEST RECOLLECTION (A)  Final   Report Status 04/19/2018 FINAL  Final         Radiology Studies: Ct Head Wo Contrast  Result Date: 04/18/2018 CLINICAL DATA:  Headache EXAM: CT HEAD WITHOUT CONTRAST TECHNIQUE: Contiguous axial images were obtained from the base of the skull through the vertex without intravenous contrast. COMPARISON:  10/19/2017 FINDINGS: Brain: No acute intracranial abnormality. Specifically, no hemorrhage, hydrocephalus, mass lesion, acute infarction, or significant intracranial injury. Mild age related volume loss. Vascular: No hyperdense vessel or unexpected calcification. Skull: No acute calvarial abnormality. Sinuses/Orbits: Visualized paranasal sinuses and mastoids clear. Orbital soft tissues unremarkable. Other: None IMPRESSION: No acute intracranial abnormality. Electronically Signed   By:  Charlett Nose M.D.   On: 04/18/2018 13:00        Scheduled Meds: . calcium-vitamin D  2 tablet Oral Daily  . clonazePAM  0.5 mg Oral QHS  . diclofenac sodium  2 g Topical QID  . ferrous gluconate  324 mg Oral Daily  . furosemide  80 mg Intravenous BID  . ipratropium  0.5 mg Nebulization QID  . isosorbide mononitrate  60 mg Oral Daily  . loratadine  10 mg Oral Daily  . metoprolol tartrate  50 mg Oral BID  . pantoprazole  40 mg Oral Daily  . potassium chloride SA  40 mEq Oral BID  . Rivaroxaban  15 mg Oral Q supper  . simvastatin  20 mg Oral q1800  . sodium chloride flush  3 mL Intravenous Q12H  . spironolactone  25 mg Oral Daily   Continuous Infusions: . sodium chloride    . amiodarone Stopped (04/18/18 1415)  . cefTRIAXone (ROCEPHIN)  IV Stopped (04/18/18 1547)     LOS: 2 days    Time spent: 35 minutes    Alba Cory, MD [facility] Pager [phone]  If 7PM-7AM, please contact night-coverage [email or web address] Password [facility] 04/19/2018, 10:59 AM

## 2018-04-19 NOTE — Telephone Encounter (Signed)
I called her hospitalist and suggested using a metered inhaler, since the dose will be less. Patient doesn't like dry powder inhalers, so hospital pharmacy might carry atrovent hfa. Any inhaler may be better tolerated, because of lower dose, than neb treatment.

## 2018-04-19 NOTE — Progress Notes (Signed)
Transported to 2W03 on BIPAP without complications.

## 2018-04-20 LAB — CBC
HCT: 38 % (ref 36.0–46.0)
HEMOGLOBIN: 10.9 g/dL — AB (ref 12.0–15.0)
MCH: 29.2 pg (ref 26.0–34.0)
MCHC: 28.7 g/dL — ABNORMAL LOW (ref 30.0–36.0)
MCV: 101.9 fL — ABNORMAL HIGH (ref 78.0–100.0)
Platelets: 207 10*3/uL (ref 150–400)
RBC: 3.73 MIL/uL — AB (ref 3.87–5.11)
RDW: 12.8 % (ref 11.5–15.5)
WBC: 9 10*3/uL (ref 4.0–10.5)

## 2018-04-20 LAB — BASIC METABOLIC PANEL
ANION GAP: 11 (ref 5–15)
BUN: 24 mg/dL — AB (ref 8–23)
CO2: 42 mmol/L — ABNORMAL HIGH (ref 22–32)
Calcium: 9.7 mg/dL (ref 8.9–10.3)
Chloride: 91 mmol/L — ABNORMAL LOW (ref 98–111)
Creatinine, Ser: 1.09 mg/dL — ABNORMAL HIGH (ref 0.44–1.00)
GFR, EST AFRICAN AMERICAN: 55 mL/min — AB (ref >60)
GFR, EST NON AFRICAN AMERICAN: 47 mL/min — AB (ref >60)
Glucose, Bld: 111 mg/dL — ABNORMAL HIGH (ref 70–99)
POTASSIUM: 3.8 mmol/L (ref 3.5–5.1)
SODIUM: 144 mmol/L (ref 135–145)

## 2018-04-20 LAB — TSH: TSH: 0.69 u[IU]/mL (ref 0.350–4.500)

## 2018-04-20 MED ORDER — FLUTICASONE PROPIONATE 50 MCG/ACT NA SUSP
1.0000 | Freq: Every day | NASAL | Status: DC
  Administered 2018-04-20 – 2018-04-24 (×4): 1 via NASAL
  Filled 2018-04-20: qty 16

## 2018-04-20 MED ORDER — ACETAZOLAMIDE 250 MG PO TABS
250.0000 mg | ORAL_TABLET | Freq: Two times a day (BID) | ORAL | Status: DC
  Administered 2018-04-20 (×2): 250 mg via ORAL
  Filled 2018-04-20 (×3): qty 1

## 2018-04-20 MED ORDER — IPRATROPIUM BROMIDE HFA 17 MCG/ACT IN AERS
1.0000 | INHALATION_SPRAY | Freq: Four times a day (QID) | RESPIRATORY_TRACT | Status: DC
  Administered 2018-04-20 – 2018-04-21 (×6): 1 via RESPIRATORY_TRACT
  Filled 2018-04-20: qty 12.9

## 2018-04-20 MED ORDER — SENNA 8.6 MG PO TABS
1.0000 | ORAL_TABLET | Freq: Every day | ORAL | Status: DC
  Administered 2018-04-20 – 2018-04-24 (×4): 8.6 mg via ORAL
  Filled 2018-04-20 (×6): qty 1

## 2018-04-20 NOTE — Progress Notes (Signed)
RT called to pt's room to access due to desat.  Pt desated when moved from bed to chair.  Pt on [place] 6 L with a sat of 100% and tolerating well, provider at bedside.

## 2018-04-20 NOTE — Telephone Encounter (Signed)
Noted by triage. Will sign off on message.

## 2018-04-20 NOTE — Progress Notes (Signed)
PROGRESS NOTE    [name]  YBO:[id number] DOB: 1941-02-07 DOA: 04/16/2018 PCP: Maurice Small, MD    Brief Narrative: Brief Narrative:  77 year old married female, lives with spouse who is an amputee and wheelchair-bound, patient ambulates with the help of a walker, PMH of chronic diastolic CHF, moderate pulmonary HTN, HTN, HLD, CAD status post CABG, PAF on Xarelto, COPD, chronic hypoxic respiratory failure on home oxygen (2 L at rest and 3 L with activity), anxiety and depression, GERD, negative sleep study 02/22/2016, followed at the HF clinic, last seen in HF clinic 03/22/2018 when given extra dose of torsemide, presented to [facility] ED 04/16/2018 due to progressively worsening dyspnea, significantly decreased effort tolerance over the last month or 2, intermittent chest tightness and intermittent epistaxis.  Admitted for acute on chronic diastolic CHF, acute on chronic hypoxic respiratory failure. HF Cardiology consulted.   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  Continue with lasix  80 mg  BID IV.  S/P cardioversion 9-25.  Patient more SOB on 9-26 , Increase work of breathing. She will be transfer to step down on BIPAP, klonopin dose given.  Repeated chest x ray, stable changes.  Today she is still SOB, feels weak. Report mild headache.   2-UTI; UA with 12-20 WBC, hematuria.  Follow urine culture. Re send culture.  Continue with ceftriaxone.   3-COPD;  On  ipratropium .  Does not tolerates albuterol, xopenex.  Report no headaches today, has use ipratropium.  I spoke with Dr Maple Hudson, we could try Atrovent hfa. Report headache today, will change to Atrovent HFA. Appreciate pharmacyst help.   4-Headache;  For last week.  ? Worse after ipratropium . Discussed with pulmonary , could try Spiriva, ellipta. Patient report intolerance to those medications.  CT head negative.  Will change Atrovent to HFA> to see if that helps with headaches.   Acute on chronic hypoxic respiratory failure Multifactorial.  On IV lasix.  Component of anxiety also.   Paroxysmal A fib;  DCCV 9-25 Was on amiodarone gtt.  On xarelto.   Epistaxis: Intermittent, likely due to Xarelto and home oxygen use.  Insure humidified oxygen.  If worsens, may need ENT consultation.  CAD status post CABG: Per cardiology, 90% stenosis and distal LCx, not amenable to PCI.  Currently without anginal symptoms.  Troponins minimally elevated in the 0.03 range and flat trend.  Continue Imdur, metoprolol and simvastatin.  No aspirin due to Xarelto  Rheumatic mitral stenosis, moderate: As per cardiology, poor candidate for mitral valvuloplasty or surgery.  Hence medical management.  Stage III CKD; baseline cr 1.3.  Monitor.     DVT prophylaxis: xarelto Code Status: DNR Family Communication: daughter at bedside.  Disposition Plan: Increase work of breathing today, place on BIPAP, IV lasix. Transfer to step down unit   Consultants:   Cardiology    Procedures:   Cardioversion 9-25   Antimicrobials:   none   Subjective: Oxygen sat was not measuring well, change probe  to earlobe and sat reading at 100 % on 6 L.  She report SOB, some what improved today.   Objective: Vitals:   04/20/18 0300 04/20/18 0826 04/20/18 0839 04/20/18 0930  BP: (!) 115/55 (!) 132/48    Pulse: 60 62 64 68  Resp: 20 (!) 32 (!) 24 (!) 27  Temp: 98 F (36.7 C) 98.1 F (36.7 C)    TempSrc: Oral  Oral    SpO2: 98% 100% 99% 100%  Weight:      Height:        Intake/Output Summary (Last 24 hours) at 04/20/2018 0938 Last data filed at 04/20/2018 0500 Gross per 24 hour  Intake 240 ml  Output 1400 ml  Net -1160 ml   Filed Weights   04/18/18 0500 04/19/18 0522 04/19/18 1535  Weight: 88.1 kg 87.2 kg 86.2 kg    Examination:  General exam: anxious, sitting in recliner.  Respiratory system: bilateral fine crackles.  Cardiovascular system: S 1, S 2 RRR Gastrointestinal system: BS , present, soft, NT Central nervous system: Non focal.  Extremities: Symmetric power  Skin: No rash      Data Reviewed: I have personally reviewed following labs and imaging studies  CBC: Recent Labs  Lab 04/16/18 1039 04/19/18 0452 04/20/18 0301  WBC 9.0 9.7 9.0  NEUTROABS 6.7  --   --   HGB 10.7* 10.8* 10.9*  HCT 37.0 37.2 38.0  MCV 101.1* 102.5* 101.9*  PLT 223 213 207   Basic Metabolic Panel: Recent Labs  Lab 04/16/18 1039 04/17/18 0238 04/18/18 0445 04/19/18 0452 04/20/18 0719  NA 142 143 143 145 144  K 3.8 5.0 4.2 4.7 3.8  CL 93* 95* 95* 96* 91*  CO2 34* 33* 39* 39* 42*  GLUCOSE 102* 150* 102* 108* 111*  BUN 19 23 31* 29* 24*  CREATININE 1.31* 1.34* 1.31* 1.28* 1.09*  CALCIUM 9.1 9.2 10.0 9.4 9.7   GFR: Estimated Creatinine Clearance: 47.1 mL/min (A) (by C-G formula based on SCr of 1.09 mg/dL (H)). Liver Function Tests: Recent Labs  Lab 04/16/18 1039  AST 15  ALT 11  ALKPHOS 63  BILITOT 0.4  PROT 6.6  ALBUMIN 3.1*   No results for input(s): LIPASE, AMYLASE in the last 168 hours. No results for input(s): AMMONIA in the last 168 hours. Coagulation Profile: No results for input(s): INR, PROTIME in the last 168 hours. Cardiac Enzymes: Recent Labs  Lab 04/16/18 1039 04/16/18 1551 04/16/18 2020 04/17/18 0238  TROPONINI 0.03* 0.03* <0.03 0.03*   BNP (last 3 results) No results for input(s): PROBNP in the last 8760 hours. HbA1C: No results for input(s):  HGBA1C in the last 72 hours. CBG: No results for input(s): GLUCAP in the last 168 hours. Lipid Profile: No results for input(s): CHOL, HDL, LDLCALC, TRIG, CHOLHDL, LDLDIRECT in the last 72 hours. Thyroid Function Tests: No results for input(s): TSH, T4TOTAL, FREET4, T3FREE, THYROIDAB in the last 72 hours. Anemia Panel: No results for input(s): VITAMINB12, FOLATE, FERRITIN, TIBC, IRON, RETICCTPCT in the last 72 hours. Sepsis Labs: No results for input(s): PROCALCITON, LATICACIDVEN in the last 168 hours.  Recent Results (from the past 240 hour(s))  Urine Culture     Status: Abnormal   Collection Time: 04/18/18 10:54 AM  Result Value Ref Range Status   Specimen Description URINE, RANDOM  Final   Special Requests   Final    NONE Performed at [facility], [address], [place], [place] [address]    Culture MULTIPLE  SPECIES PRESENT, SUGGEST RECOLLECTION (A)  Final   Report Status 04/19/2018 FINAL  Final  MRSA PCR Screening     Status: None   Collection Time: 04/19/18 12:21 PM  Result Value Ref Range Status   MRSA by PCR NEGATIVE NEGATIVE Final    Comment:        The GeneXpert MRSA Assay (FDA approved for NASAL specimens only), is one component of a comprehensive MRSA colonization surveillance program. It is not intended to diagnose MRSA infection nor to guide or monitor treatment for MRSA infections. Performed at [facility], [address], [place], [place] [address]          Radiology Studies: Ct Head Wo Contrast  Result Date: 04/18/2018 CLINICAL DATA:  Headache EXAM: CT HEAD WITHOUT CONTRAST TECHNIQUE: Contiguous axial images were obtained from the base of the skull through the vertex without intravenous contrast. COMPARISON:  10/19/2017 FINDINGS: Brain: No acute intracranial abnormality. Specifically, no hemorrhage, hydrocephalus, mass lesion, acute infarction, or significant intracranial injury. Mild age related volume loss. Vascular: No hyperdense  vessel or unexpected calcification. Skull: No acute calvarial abnormality. Sinuses/Orbits: Visualized paranasal sinuses and mastoids clear. Orbital soft tissues unremarkable. Other: None IMPRESSION: No acute intracranial abnormality. Electronically Signed   By: Charlett Nose M.D.   On: 04/18/2018 13:00   Dg Chest Port 1 View  Result Date: 04/19/2018 CLINICAL DATA:  77 year old female with a history of shortness of breath EXAM: PORTABLE CHEST 1 VIEW COMPARISON:  04/16/2018, 02/23/2018, 02/21/2018 FINDINGS: Cardiomediastinal silhouette unchanged in size and contour. Surgical changes of median sternotomy and CABG. Similar appearance of elevation of the left hemidiaphragm with similar appearance of opacity at the left lung base. Linear opacities bilateral lung bases. No pneumothorax. No displaced fracture. IMPRESSION: Similar appearance of the lungs, with linear and patchy opacities at the lung bases, which may represent a combination of scarring/chronic lung changes and/or consolidation. Surgical changes of median sternotomy and CABG. Electronically Signed   By: Gilmer Mor D.O.   On: 04/19/2018 11:43        Scheduled Meds: . amiodarone  200 mg Oral Daily  . calcium-vitamin D  2 tablet Oral Daily  . clonazePAM  0.5 mg Oral QHS  . diclofenac sodium  2 g Topical QID  . ferrous gluconate  324 mg Oral Daily  . fluticasone  1 spray Each Nare Daily  . furosemide  80 mg Intravenous BID  . ipratropium  0.5 mg Nebulization QID  . isosorbide mononitrate  60 mg Oral Daily  . loratadine  10 mg Oral Daily  . metoprolol tartrate  25 mg Oral BID  . pantoprazole  40 mg Oral Daily  . potassium chloride SA  40 mEq Oral BID  . Rivaroxaban  15 mg Oral Q supper  . simvastatin  20 mg Oral q1800  . sodium chloride flush  3 mL Intravenous Q12H  . spironolactone  25 mg Oral Daily   Continuous Infusions: . sodium chloride    . cefTRIAXone (ROCEPHIN)  IV 1 g (04/19/18 1649)     LOS: 3 days    Time spent: 35  minutes    Alba Cory, MD [facility] Pager [phone]  If 7PM-7AM, please contact night-coverage [email or web address] Password [facility] 04/20/2018, 9:38 AM

## 2018-04-20 NOTE — Evaluation (Signed)
Physical Therapy Evaluation Patient Details Name: [name] MRN: [id number] DOB: 02-Sep-1940 Today's Date: 04/20/2018   History of Present Illness  Pt is a 77 y.o. female with history h/o chronic diastolic CHF with chronic respiratory failure requiring home O2 2 L to 3 L at baseline, paroxysmal atrial fibrillation on chronic anticoagulation with Xarelto, COPD, anxiety disorder and CAD presents with complaints of progressive dyspnea for the last 1 month.Underwent successful DCCV 9/25. Complicated by hypotension and bradycardia afterward.  Clinical Impression  PTA pt living with husband and son in single story home with ramp to enter. Pt with decreasing ability to ambulate with Rollator due to DoE. Pt currently limited in safe mobility by 4/4 DoE with mobility, and generalized deconditioning. Pt currently mod A for bed mobility and maxA for stand pivot transfer to recliner. PT recommending SNF level rehab at d/c however family refusing, in which case she will need HHPT and 24 hr supervision. PT will continue to follow acutely.    Follow Up Recommendations SNF;Supervision/Assistance - 24 hour(if pt refuses HHPT and 24 hr assist )    Equipment Recommendations  None recommended by PT    Recommendations for Other Services OT consult     Precautions / Restrictions Precautions Precautions: Fall Restrictions Weight Bearing Restrictions: No      Mobility  Bed Mobility Overal bed mobility: Needs Assistance Bed Mobility: Supine to Sit     Supine to sit: Mod assist     General bed mobility comments: mod A for bringing trunk to upright and for pad scoot to EoB c/o dizziness BP 118/58, vc for keeping eyes open, pt states that light has become irritating in the last couple weeks   Transfers Overall transfer level: Needs assistance Equipment used: 1 person hand held assist Transfers: Sit to/from [employer] Sit to Stand: Mod assist Stand pivot transfers: Max assist        General transfer comment: mod A for power up and steadying, max A to pivot step to recliner  Ambulation/Gait             General Gait Details: unable to attempt due to fatigue, weakness      Balance Overall balance assessment: Needs assistance Sitting-balance support: Bilateral upper extremity supported;Feet supported Sitting balance-Leahy Scale: Poor     Standing balance support: Bilateral upper extremity supported;During functional activity Standing balance-Leahy Scale: Zero                               Pertinent Vitals/Pain Pain Assessment: No/denies pain    Home Living Family/patient expects to be discharged to:: Private residence Living Arrangements: Spouse/significant other(bilateral AKA in wheelchair, he is independant but can't as) Available Help at Discharge: Family;Available PRN/intermittently Type of Home: House Home Access: Ramped entrance     Home Layout: One level Home Equipment: Walker - 4 wheels;Walker - 2 wheels;Hand held shower head;Shower seat;Bedside commode;Wheelchair - manual Additional Comments: Pt's husband is bilat amputee and utilizes power w/c. Son works as a Music therapist. He lives with them and is there at night (and can come during the day if needed.)     Prior Function Level of Independence: Needs assistance   Gait / Transfers Assistance Needed: ambulates short distances with rollator  ADL's / Homemaking Assistance Needed: son does home mgmt and driving           Extremity/Trunk Assessment   Upper Extremity Assessment Upper Extremity Assessment: Generalized weakness(slightly tremulous with  movement)    Lower Extremity Assessment Lower Extremity Assessment: Generalized weakness       Communication   Communication: No difficulties  Cognition Arousal/Alertness: Awake/alert Behavior During Therapy: Anxious Overall Cognitive Status: Within Functional Limits for tasks assessed                                         General Comments General comments (skin integrity, edema, etc.): Daughter and husband present during session, Pt on 5L O2 via [place] at rest SaO2 100%O2, with movement dropped to 95%O2 with sitting in recliner SaO2 98%O2        Assessment/Plan    PT Assessment Patient needs continued PT services  PT Problem List Decreased strength;Decreased range of motion;Decreased activity tolerance;Decreased balance       PT Treatment Interventions DME instruction;Gait training;Functional mobility training;Therapeutic activities;Therapeutic exercise;Balance training;Patient/family education    PT Goals (Current goals can be found in the Care Plan section)  Acute Rehab PT Goals Patient Stated Goal: feel better PT Goal Formulation: With patient/family Time For Goal Achievement: 05/04/18 Potential to Achieve Goals: Fair    Frequency Min 3X/week    AM-PAC PT "6 Clicks" Daily Activity  Outcome Measure Difficulty turning over in bed (including adjusting bedclothes, sheets and blankets)?: A Lot Difficulty moving from lying on back to sitting on the side of the bed? : Unable Difficulty sitting down on and standing up from a chair with arms (e.g., wheelchair, bedside commode, etc,.)?: Unable Help needed moving to and from a bed to chair (including a wheelchair)?: A Lot Help needed walking in hospital room?: Total Help needed climbing 3-5 steps with a railing? : Total 6 Click Score: 8    End of Session Equipment Utilized During Treatment: Gait belt;Oxygen Activity Tolerance: Patient limited by fatigue Patient left: in chair;with call bell/phone within reach;with chair alarm set;with family/visitor present Nurse Communication: Mobility status PT Visit Diagnosis: Unsteadiness on feet (R26.81);Other abnormalities of gait and mobility (R26.89);Muscle weakness (generalized) (M62.81);Difficulty in walking, not elsewhere classified (R26.2)    Time: 9935-7017 PT Time Calculation (min)  (ACUTE ONLY): 30 min   Charges:   PT Evaluation $PT Eval Moderate Complexity: 1 Mod PT Treatments $Therapeutic Activity: 8-22 mins        Jaquin Coy B. Beverely Risen PT, DPT Acute Rehabilitation Services Pager [phone] Office [phone]   Elon Alas Fleet 04/20/2018, 9:35 AM

## 2018-04-20 NOTE — Care Management Important Message (Signed)
Important Message  Patient Details  Name: MARKIA OCCHIPINTI MRN: [id number] Date of Birth: 12-Aug-1940   Medicare Important Message Given:  Yes    Dorena Bodo 04/20/2018, 3:16 PM

## 2018-04-20 NOTE — Progress Notes (Addendum)
Advanced Heart Failure Rounding Note  PCP-Cardiologist: No primary care provider on file.   Subjective:    Underwent successful DCCV 9/25. Maintaining NSR.   Ongoing shortness of breath with exertion. Complaining of fatigue.    Objective:   Weight Range: 86.2 kg Body mass index is 30.67 kg/m.   Vital Signs:   Temp:  [97.8 F (36.6 C)-98.8 F (37.1 C)] 98.1 F (36.7 C) (09/27 0826) Pulse Rate:  [57-68] 59 (09/27 1140) Resp:  [19-32] 28 (09/27 1140) BP: (115-132)/(45-63) 118/56 (09/27 0900) SpO2:  [93 %-100 %] 96 % (09/27 1140) Weight:  [86.2 kg] 86.2 kg (09/26 1535) Last BM Date: 04/14/18  Weight change: Filed Weights   04/18/18 0500 04/19/18 0522 04/19/18 1535  Weight: 88.1 kg 87.2 kg 86.2 kg    Intake/Output:   Intake/Output Summary (Last 24 hours) at 04/20/2018 1228 Last data filed at 04/20/2018 0952 Gross per 24 hour  Intake 340 ml  Output 800 ml  Net -460 ml      Physical Exam  General:  Elderly. In the chair.  No resp difficulty HEENT: normal Neck: supple. JVP to jaw. . Carotids 2+ bilat; no bruits. No lymphadenopathy or thryomegaly appreciated. Cor: PMI nondisplaced. Regular rate & rhythm. No rubs, gallops. 2/5 SEM RUSB.  Lungs: Crackles RLL LLL on 6 liters oxygen.  Abdomen: soft, nontender, nondistended. No hepatosplenomegaly. No bruits or masses. Good bowel sounds. Extremities: no cyanosis, clubbing, rash, R and LLE trace edema.  Neuro: alert & orientedx3, cranial nerves grossly intact. moves all 4 extremities w/o difficulty. Affect pleasant     Telemetry   NSR/SB 50-60s   EKG    No new tracings.  Labs    CBC Recent Labs    04/19/18 0452 04/20/18 0301  WBC 9.7 9.0  HGB 10.8* 10.9*  HCT 37.2 38.0  MCV 102.5* 101.9*  PLT 213 207   Basic Metabolic Panel Recent Labs    16/10/96 0452 04/20/18 0719  NA 145 144  K 4.7 3.8  CL 96* 91*  CO2 39* 42*  GLUCOSE 108* 111*  BUN 29* 24*  CREATININE 1.28* 1.09*  CALCIUM 9.4 9.7    Liver Function Tests No results for input(s): AST, ALT, ALKPHOS, BILITOT, PROT, ALBUMIN in the last 72 hours. No results for input(s): LIPASE, AMYLASE in the last 72 hours. Cardiac Enzymes No results for input(s): CKTOTAL, CKMB, CKMBINDEX, TROPONINI in the last 72 hours.  BNP: BNP (last 3 results) Recent Labs    02/21/18 1717 03/22/18 1514 04/16/18 1039  BNP 252.7* 357.1* 745.9*    ProBNP (last 3 results) No results for input(s): PROBNP in the last 8760 hours.   D-Dimer No results for input(s): DDIMER in the last 72 hours. Hemoglobin A1C No results for input(s): HGBA1C in the last 72 hours. Fasting Lipid Panel No results for input(s): CHOL, HDL, LDLCALC, TRIG, CHOLHDL, LDLDIRECT in the last 72 hours. Thyroid Function Tests Recent Labs    04/20/18 0956  TSH 0.690    Other results:   Imaging    No results found.   Medications:     Scheduled Medications: . amiodarone  200 mg Oral Daily  . calcium-vitamin D  2 tablet Oral Daily  . clonazePAM  0.5 mg Oral QHS  . diclofenac sodium  2 g Topical QID  . ferrous gluconate  324 mg Oral Daily  . fluticasone  1 spray Each Nare Daily  . furosemide  80 mg Intravenous BID  . ipratropium  0.5 mg Nebulization  QID  . isosorbide mononitrate  60 mg Oral Daily  . loratadine  10 mg Oral Daily  . metoprolol tartrate  25 mg Oral BID  . pantoprazole  40 mg Oral Daily  . potassium chloride SA  40 mEq Oral BID  . Rivaroxaban  15 mg Oral Q supper  . senna  1 tablet Oral Daily  . simvastatin  20 mg Oral q1800  . sodium chloride flush  3 mL Intravenous Q12H  . spironolactone  25 mg Oral Daily    Infusions: . sodium chloride    . cefTRIAXone (ROCEPHIN)  IV 1 g (04/19/18 1649)    PRN Medications: acetaminophen, benzonatate, bisacodyl, butalbital-acetaminophen-caffeine, clonazePAM, docusate sodium, nitroGLYCERIN, oxymetazoline, sodium chloride flush    Patient Profile   [name] is a 77 y.o. female with a history  of chronic diastolic CHF, moderate pulmonary HTN, HTN, CAD s/p CABG, COPD on home oxygen, and dyslipidemia. Well known to the HF clinic  Admitted 04/16/18 for SOB and c/o nose bleeds. Hgb stable. BNP and volume status elevated.   Assessment/Plan   1. 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 Suspect significant RV failure in setting of moderate mitral stenosis and severe TR. She has been very difficult to manage due to combination of CHF, valvular heart disease and severe COPD. Normal CO on RHC 02/26/18. - Remains volume overloaded.  - Add diamox 250 mg twice a day x 2 days.  - Continue lasix 80 mg IV BID. - Continuespironolactone 25 mg daily.  2. CAD:S/P CABG. -She has potential source of angina from 90% stenosis in distal LCx. The vessel is small at this point and not amenable to PCI. -No s/sischemia.  - Continue Imdur 90 mg daily.  - Continue statin.  - No ASA given Xarelto use.  3. Atrial fibrillation: Paroxysmal. - S/p DCCV 9/25. - Maintaining NSR.  - Continue Xarelto.  - Continue lopressor 50 mg BID - Continue amio 200 mg daily.   4.COPD - PFTs in 2/17 with moderate to severe obstruction.  - Continue chronic O2 2-3 lpm. Currently on 4L [place]. -Followswith Dr Maple Hudson 5. 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 She also has mild to moderate MR.Suspect that the MR makes her a poor candidate for mitral valvuloplasty, and she also would not tolerate anesthesia well with severe COPD. She also would be a poor surgical candidate due to frailty and severe COPD.Suspectour only choice here will be medical management. - No change.  6. Pulmonary HTN:  -This is primarily pulmonary venous hypertension based on  last RHC. - Continue diuresis. No change.  7. OSA - Intolerant of CPAP.Encouraged her to find CPAP that works for her. No change.  8. Anemia - hemoglobin stable.  9. UTI with hematuria - UA +. Continue ceftriaxone per primary team.  10. Lung nodule: Concerning for malignancy, plan for PET as outpatient.  Medication concerns reviewed with patient and pharmacy team. Barriers identified: None at this time.   Length of Stay: 3  Monica Clegg, [name]  04/20/2018, 12:28 PM  Advanced Heart Failure Team Pager [phone] (M-F; 7a - 4p)  Please contact [facility] for night-coverage after hours (4p -7a ) and weekends on [email or web address]  Patient seen with [name], agree with the above note.  She is  staying in NSR after DCCV.  Weight down 2 lbs with diuresis.  Still short of breath but off Bipap.   JVP 10-12 cm. Heart reg S1S2 with 2/6 SEM RUSB. Trace ankle edema.   1. Acute on chronic diastolic CHF: Echo in 7/19 with EF 60-65%, moderate LVH, rheumatic MV with moderate MS (mean gradient 8, MVA 1.58 cm^2), mild-moderate MR, severe TR, PASP 76 mmHg, D-shaped interventricular septum with mildly dilated RV. Suspect significant RV failure in setting of moderate mitral stenosis and severe TR. She has been very difficult to manage due to combination of CHF, valvular heart disease and severe COPD. She was admitted again with dyspnea/volume overload and in atrial fibrillation, which likely triggered this admission. She is in NSR today after DCCV.  Still short of breath and volume overloaded, now off Bipap on nasal cannula. Creatinine stable at 1.09.  -Continue Lasix 80 mg IV bid and will give dose of metolazone again today.  - spironolactone 25 mg daily. - Add Diamox with elevated HCO3.  2. CAD:S/P CABG.She has potential source of angina from 90% stenosis in distal LCx on last cath. The vessel is small at this point and not amenable to PCI.TnI 0.03 this admission with no trend, suspect demand ischemia with  volume overload. - Continue Imdur.  - Continue statin.  - No ASA given Xarelto use.  3. Atrial fibrillation:Recurrent this admission. I am concerned that the onset of atrial fibrillation has triggered her CHF exacerbation. She is now back in NSR after DCCV on 9/25.  -Continue amiodarone 200 mg daily.  -Continue Xarelto.  4. 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 She is not wheezing.  Currently on Bipap.  5. 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 She also has mild to moderate MR. I suspect that the MR makes her a poor candidate for mitral valvuloplasty, and she also would not tolerate anesthesia well with severe COPD. She also would be a poor surgical candidate due to frailty and severe COPD. I think our only choice here will be medical management.  6. Lung nodule: Concerning for malignancy, plan for PET as outpatient. 7. UTI: On ceftriaxone.  8. Anxiety: Benzodiazepines as needed.   [name] 04/20/2018 1:46 PM

## 2018-04-20 NOTE — Progress Notes (Signed)
[facility]  Patient Status: Active (receiving services up to time of hospitalization)  [facility] is providing the following services: RN, OT and HHA  If patient discharges after hours, please call [phone].   [name] 04/20/2018, 2:41 PM

## 2018-04-21 LAB — CBC
HEMATOCRIT: 37.3 % (ref 36.0–46.0)
Hemoglobin: 10.6 g/dL — ABNORMAL LOW (ref 12.0–15.0)
MCH: 29 pg (ref 26.0–34.0)
MCHC: 28.4 g/dL — ABNORMAL LOW (ref 30.0–36.0)
MCV: 101.9 fL — AB (ref 78.0–100.0)
PLATELETS: 205 10*3/uL (ref 150–400)
RBC: 3.66 MIL/uL — AB (ref 3.87–5.11)
RDW: 12.6 % (ref 11.5–15.5)
WBC: 8.6 10*3/uL (ref 4.0–10.5)

## 2018-04-21 LAB — BASIC METABOLIC PANEL
Anion gap: 8 (ref 5–15)
BUN: 26 mg/dL — ABNORMAL HIGH (ref 8–23)
CALCIUM: 9.9 mg/dL (ref 8.9–10.3)
CO2: 49 mmol/L — AB (ref 22–32)
Chloride: 85 mmol/L — ABNORMAL LOW (ref 98–111)
Creatinine, Ser: 1.35 mg/dL — ABNORMAL HIGH (ref 0.44–1.00)
GFR, EST AFRICAN AMERICAN: 42 mL/min — AB (ref >60)
GFR, EST NON AFRICAN AMERICAN: 37 mL/min — AB (ref >60)
Glucose, Bld: 112 mg/dL — ABNORMAL HIGH (ref 70–99)
POTASSIUM: 3.7 mmol/L (ref 3.5–5.1)
Sodium: 142 mmol/L (ref 135–145)

## 2018-04-21 MED ORDER — IPRATROPIUM BROMIDE HFA 17 MCG/ACT IN AERS
1.0000 | INHALATION_SPRAY | Freq: Three times a day (TID) | RESPIRATORY_TRACT | Status: DC
  Administered 2018-04-22 (×2): 1 via RESPIRATORY_TRACT
  Filled 2018-04-21: qty 12.9

## 2018-04-21 MED ORDER — DOCUSATE SODIUM 100 MG PO CAPS
200.0000 mg | ORAL_CAPSULE | Freq: Every day | ORAL | Status: DC
  Administered 2018-04-21 – 2018-04-24 (×3): 200 mg via ORAL
  Filled 2018-04-21 (×5): qty 2

## 2018-04-21 MED ORDER — ACETAZOLAMIDE 250 MG PO TABS
500.0000 mg | ORAL_TABLET | Freq: Two times a day (BID) | ORAL | Status: AC
  Administered 2018-04-21 (×2): 500 mg via ORAL
  Filled 2018-04-21 (×3): qty 2

## 2018-04-21 MED ORDER — MAGNESIUM HYDROXIDE 400 MG/5ML PO SUSP
15.0000 mL | Freq: Every day | ORAL | Status: DC
  Administered 2018-04-21 – 2018-04-22 (×2): 15 mL via ORAL
  Filled 2018-04-21 (×5): qty 30

## 2018-04-21 NOTE — Progress Notes (Signed)
Son in to visit earlier and stated to this nurse that if patient did not tolerate the Bipap, then she should not wear it for treatment purposes.  Apparently, she did not tolerate the Bipap last evening.  Lungs sound more clear this afternoon as opposed to this am.

## 2018-04-21 NOTE — Plan of Care (Signed)
Patient is alert and oriented, no complaints of pain on this shift, O2 therapy in use, bath given, stepdown disposition at this time, purewick in place, excoriation of skin under breast and groin folds, keep dry to prevent progression, monitor Vs for resulted UA, Labs, cardio monitoring, will continue to monitor  Problem: Health Behavior/Discharge Planning: Goal: Ability to manage health-related needs will improve Outcome: Progressing   Problem: Clinical Measurements: Goal: Ability to maintain clinical measurements within normal limits will improve Outcome: Progressing Goal: Will remain free from infection Outcome: Progressing Goal: Diagnostic test results will improve Outcome: Progressing Goal: Respiratory complications will improve Outcome: Progressing Goal: Cardiovascular complication will be avoided Outcome: Progressing   Problem: Activity: Goal: Risk for activity intolerance will decrease Outcome: Progressing   Problem: Nutrition: Goal: Adequate nutrition will be maintained Outcome: Progressing   Problem: Coping: Goal: Level of anxiety will decrease Outcome: Progressing   Problem: Elimination: Goal: Will not experience complications related to bowel motility Outcome: Progressing Goal: Will not experience complications related to urinary retention Outcome: Progressing   Problem: Pain Managment: Goal: General experience of comfort will improve Outcome: Progressing   Problem: Safety: Goal: Ability to remain free from injury will improve Outcome: Progressing   Problem: Skin Integrity: Goal: Risk for impaired skin integrity will decrease Outcome: Progressing   Problem: Education: Goal: Ability to demonstrate management of disease process will improve Outcome: Progressing

## 2018-04-21 NOTE — Progress Notes (Signed)
This nurse noted that patient's daughter had assisted patient to the commode and left the room to get something to eat at the cafeteria.  No one was in sight of the patient at this time.  I entered the room to find patient leaning to the right as she was quite somnolent.  Patient was supervised while up to commode and assisted to recliner once finished with two assists.  Chair alarm in place.   Discussed fall protocol with patient and daughter due to patient's high fall risk.  Safety plan continues to be in effect.

## 2018-04-21 NOTE — Progress Notes (Signed)
Progress Note  Patient Name: [name] Date of Encounter: 04/21/2018  Primary Cardiologist: Dr Shirlee Latch  Subjective   Pt states she does not feel well; mildly dysneic; no chest pain; pt's son states she is anxious.  Inpatient Medications    Scheduled Meds: . acetaZOLAMIDE  500 mg Oral BID  . amiodarone  200 mg Oral Daily  . calcium-vitamin D  2 tablet Oral Daily  . clonazePAM  0.5 mg Oral QHS  . diclofenac sodium  2 g Topical QID  . docusate sodium  200 mg Oral Daily  . ferrous gluconate  324 mg Oral Daily  . fluticasone  1 spray Each Nare Daily  . furosemide  80 mg Intravenous BID  . ipratropium  1 puff Inhalation Q6H  . isosorbide mononitrate  60 mg Oral Daily  . loratadine  10 mg Oral Daily  . magnesium hydroxide  15 mL Oral Daily  . metoprolol tartrate  25 mg Oral BID  . pantoprazole  40 mg Oral Daily  . potassium chloride SA  40 mEq Oral BID  . Rivaroxaban  15 mg Oral Q supper  . senna  1 tablet Oral Daily  . simvastatin  20 mg Oral q1800  . sodium chloride flush  3 mL Intravenous Q12H  . spironolactone  25 mg Oral Daily   Continuous Infusions: . sodium chloride    . cefTRIAXone (ROCEPHIN)  IV 1 g (04/20/18 1438)   PRN Meds: acetaminophen, benzonatate, bisacodyl, butalbital-acetaminophen-caffeine, clonazePAM, nitroGLYCERIN, oxymetazoline, sodium chloride flush   Vital Signs    Vitals:   04/21/18 0128 04/21/18 0300 04/21/18 0725 04/21/18 0804  BP:  (!) 120/47 (!) 118/32   Pulse:  (!) 59 (!) 56   Resp:  (!) 28 (!) 23   Temp:  (!) 97.5 F (36.4 C) 97.6 F (36.4 C)   TempSrc:  Oral Axillary   SpO2: 97% 100% 100% 100%  Weight:      Height:        Intake/Output Summary (Last 24 hours) at 04/21/2018 1023 Last data filed at 04/20/2018 2206 Gross per 24 hour  Intake 103 ml  Output 1750 ml  Net -1647 ml   Filed Weights   04/18/18 0500 04/19/18 0522 04/19/18 1535  Weight: 88.1 kg 87.2 kg 86.2 kg    Telemetry    ? Sinus; significant artifact -  Personally Reviewed  Physical Exam   GEN: No acute distress.   Neck: supple Cardiac: RRR, no murmurs, rubs, or gallops.  Respiratory: minimal basilar crackles GI: Soft, nontender, non-distended  MS: No edema Neuro:  Nonfocal  Psych: Normal affect   Labs    Chemistry Recent Labs  Lab 04/16/18 1039  04/19/18 0452 04/20/18 0719 04/21/18 0104  NA 142   < > 145 144 142  K 3.8   < > 4.7 3.8 3.7  CL 93*   < > 96* 91* 85*  CO2 34*   < > 39* 42* 49*  GLUCOSE 102*   < > 108* 111* 112*  BUN 19   < > 29* 24* 26*  CREATININE 1.31*   < > 1.28* 1.09* 1.35*  CALCIUM 9.1   < > 9.4 9.7 9.9  PROT 6.6  --   --   --   --   ALBUMIN 3.1*  --   --   --   --   AST 15  --   --   --   --   ALT 11  --   --   --   --  ALKPHOS 63  --   --   --   --   BILITOT 0.4  --   --   --   --   GFRNONAA 38*   < > 39* 47* 37*  GFRAA 44*   < > 45* 55* 42*  ANIONGAP 15   < > 10 11 8    < > = values in this interval not displayed.     Hematology Recent Labs  Lab 04/19/18 0452 04/20/18 0301 04/21/18 0104  WBC 9.7 9.0 8.6  RBC 3.63* 3.73* 3.66*  HGB 10.8* 10.9* 10.6*  HCT 37.2 38.0 37.3  MCV 102.5* 101.9* 101.9*  MCH 29.8 29.2 29.0  MCHC 29.0* 28.7* 28.4*  RDW 12.8 12.8 12.6  PLT 213 207 205    Cardiac Enzymes Recent Labs  Lab 04/16/18 1039 04/16/18 1551 04/16/18 2020 04/17/18 0238  TROPONINI 0.03* 0.03* <0.03 0.03*    BNP Recent Labs  Lab 04/16/18 1039  BNP 745.9*     Radiology    Dg Chest Port 1 View  Result Date: 04/19/2018 CLINICAL DATA:  77 year old female with a history of shortness of breath EXAM: PORTABLE CHEST 1 VIEW COMPARISON:  04/16/2018, 02/23/2018, 02/21/2018 FINDINGS: Cardiomediastinal silhouette unchanged in size and contour. Surgical changes of median sternotomy and CABG. Similar appearance of elevation of the left hemidiaphragm with similar appearance of opacity at the left lung base. Linear opacities bilateral lung bases. No pneumothorax. No displaced fracture.  IMPRESSION: Similar appearance of the lungs, with linear and patchy opacities at the lung bases, which may represent a combination of scarring/chronic lung changes and/or consolidation. Surgical changes of median sternotomy and CABG. Electronically Signed   By: Gilmer Mor D.O.   On: 04/19/2018 11:43    Patient Profile     [name] a 77 y.o.femalewith a history of chronic diastolic CHF, moderate pulmonary HTN, HTN, CAD s/p CABG, COPD on home oxygen, and dyslipidemia. Well known to the HF clinic  Admitted 04/16/18 for SOB and c/o nose bleeds. Hgb stable. BNP and volume status elevated. Also underwent cardioversion for newly diagnosed atrial fibrillation.  Last echocardiogram July 2019 showed normal LV systolic function, mild aortic insufficiency, moderate mitral stenosis and mild to moderate mitral regurgitation, biatrial enlargement, mild right ventricular enlargement, severe tricuspid regurgitation and moderate pulmonary hypertension.  Assessment & Plan    1 acute on chronic diastolic heart failure-contribution from mitral stenosis/mitral regurgitation. [id number].  Continue present dose of Lasix and Spironolactone and follow renal function.  2 paroxysmal atrial fibrillation-status post cardioversion.  Continue Xarelto.  Continue amiodarone.  Unclear if patient is in sinus this morning as significant artifact on monitor.  Will check ECG.  3 coronary artery disease status post coronary artery bypass graft-we are treating medically.  Continue statin.  No aspirin given need for anticoagulation.  4 COPD-management per primary care.  5 mitral stenosis-as outlined in previous notes she is not a good candidate for balloon valvuloplasty nor would she be a good surgical candidate.  Medical therapy only option.  6 lung nodule-PET scan as an outpatient.  For questions or updates, please contact [facility] Please consult [email or web address] for contact info under        Signed, Olga Millers, MD  04/21/2018, 10:23 AM

## 2018-04-21 NOTE — Progress Notes (Signed)
Patient returned to bed with two assists.  Affect is anxious, and vital signs are stable.  In SR at this time.  Family requested that clonopin be given despite somnolence.  They feel that she is anxious and "needs it".  Family educated as to side effects of clonopin and it was administered as per their request.  (Son had indicated earlier his concern that the patient's somnolence was caused by the clonopin given last night).  Asking many questions, which were referred to MD and cardiologist, who will be seeing her.

## 2018-04-21 NOTE — Progress Notes (Signed)
PROGRESS NOTE    [name]  IRJ:[id number] DOB: 01/22/1941 DOA: 04/16/2018 PCP: Kelton Pillar, MD    Brief Narrative: Brief Narrative:  77 year old married female, lives with spouse who is an amputee and wheelchair-bound, patient ambulates with the help of a walker, PMH of chronic diastolic CHF, moderate pulmonary HTN, HTN, HLD, CAD status post CABG, PAF on Xarelto, COPD, chronic hypoxic respiratory failure on home oxygen (2 L at rest and 3 L with activity), anxiety and depression, GERD, negative sleep study 02/22/2016, followed at the HF clinic, last seen in HF clinic 03/22/2018 when given extra dose of torsemide, presented to [facility] ED 04/16/2018 due to progressively worsening dyspnea, significantly decreased effort tolerance over the last month or 2, intermittent chest tightness and intermittent epistaxis.  Admitted for acute on chronic diastolic CHF, acute on chronic hypoxic respiratory failure. HF Cardiology consulted.   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  Patient  lasix  80 mg  BID IV.  S/P cardioversion 9-25.  Patient more SOB on 9-26 , Increase work of breathing. She will be transfer to step down on BIPAP, klonopin dose given.  Repeated chest x ray, stable changes.  CO 2 49 on B-met. Increase diamox.  Offer to order ABG, to determine need for BIPAP, patient sleepy per son and confuse. . Patient does not wants to use BIPAP. Son would like to speak with  cardiology about that. Patient didn't tolerated BIPAP/   2-UTI; UA with 12-20 WBC, hematuria.  Follow urine culture. Culture 20,000 colonies  Continue with ceftriaxone. Day 4/5.  3-COPD;  On ipratropium. Does not tolerates albuterol, xopenex.  Report no headaches today, has use ipratropium.  I spoke with Dr Annamaria Boots, we could try Atrovent hfa. Report headache today, will change to Atrovent HFA. Appreciate pharmacyst help.   4-Headache;  For last week.  ? Worse after ipratropium . Discussed with pulmonary , could try Spiriva, ellipta. Patient report intolerance to those medications.  CT head negative.  Will change Atrovent to HFA> to see if that helps with headaches. Monitor response.   Acute on chronic hypoxic respiratory failure Multifactorial. Was on BIPAP transiently. Patient does not like to use  On IV lasix.  Component of anxiety also.   Paroxysmal A fib;  DCCV 9-25 Was on amiodarone gtt.  On xarelto.   Epistaxis: Intermittent, likely due to Xarelto and home oxygen use.  Insure humidified oxygen.  If worsens, may need ENT consultation.  CAD status post CABG: Per cardiology, 90% stenosis and distal LCx, not amenable to PCI.  Currently without anginal symptoms.  Troponins minimally elevated in the 0.03 range and flat trend.  Continue Imdur, metoprolol and simvastatin.  No aspirin due to Xarelto  Rheumatic mitral stenosis, moderate: As per cardiology, poor candidate for mitral valvuloplasty or surgery.  Hence medical management.  Stage III CKD; baseline cr 1.3.  Monitor.     DVT prophylaxis: xarelto Code Status: DNR Family Communication: Son  at bedside.  Disposition Plan:  I discussed with son today, we could get blood gas to evaluate hypercapnia and need to use BIPAP. Patient said, I don't want to use BIPAP. Son would like to speak with cardiology. I also offer to consult palliative care for goals of care, he decline.   Consultants:   Cardiology    Procedures:     Cardioversion 9-25   Antimicrobials:   none   Subjective: Patient sitting in recliner, she is breathing better. She keeps her eyes close, she would answer questions. She relates lights bother my eyes. She complaints of headaches. Son think patient is more confuse today. Discussed about using BIPAP. Patient said, I dont want to use that.    Objective: Vitals:   04/21/18 0128 04/21/18 0300 04/21/18 0725 04/21/18 0804  BP:  (!) 120/47 (!) 118/32   Pulse:  (!) 59 (!) 56   Resp:  (!) 28 (!) 23   Temp:  (!) 97.5 F (36.4 C) 97.6 F (36.4 C)   TempSrc:  Oral Axillary   SpO2: 97% 100% 100% 100%  Weight:      Height:        Intake/Output Summary (Last 24 hours) at 04/21/2018 0939 Last data filed at 04/20/2018 2206 Gross per 24 hour  Intake 443 ml  Output 1750 ml  Net -1307 ml   Filed Weights   04/18/18 0500 04/19/18 0522 04/19/18 1535  Weight: 88.1 kg 87.2 kg 86.2 kg    Examination:  General exam: sitting in recliner, keeps eyes close, answers questions.  Respiratory system: Bilateral crackles.  Cardiovascular system: S 1, S 2 RRR Gastrointestinal system: BS present, soft, nt Central nervous system: non focal.  Extremities: Symmetric power.  Skin: No rash     Data Reviewed: I have personally reviewed following labs and imaging studies  CBC: Recent Labs  Lab 04/16/18 1039 04/19/18 0452 04/20/18 0301 04/21/18 0104  WBC 9.0 9.7 9.0 8.6  NEUTROABS 6.7  --   --   --   HGB 10.7* 10.8* 10.9* 10.6*  HCT 37.0 37.2 38.0 37.3  MCV 101.1* 102.5* 101.9* 101.9*  PLT 223 213 207 712   Basic Metabolic Panel: Recent Labs  Lab 04/17/18 0238 04/18/18 0445 04/19/18 0452 04/20/18 0719 04/21/18 0104  NA 143 143 145 144 142  K 5.0 4.2 4.7 3.8 3.7  CL 95* 95* 96* 91* 85*  CO2 33* 39* 39* 42* 49*  GLUCOSE 150* 102* 108* 111* 112*  BUN 23 31* 29* 24* 26*  CREATININE 1.34* 1.31* 1.28* 1.09* 1.35*  CALCIUM 9.2 10.0 9.4 9.7 9.9   GFR: Estimated Creatinine Clearance:  38 mL/min (A) (by C-G formula based on SCr of 1.35 mg/dL (H)). Liver Function Tests: Recent Labs  Lab 04/16/18 1039  AST 15  ALT 11  ALKPHOS 63  BILITOT 0.4  PROT 6.6  ALBUMIN 3.1*   No results for input(s): LIPASE, AMYLASE in the last 168 hours. No results for input(s): AMMONIA in the last 168 hours. Coagulation Profile: No results for input(s): INR, PROTIME in the last 168 hours. Cardiac Enzymes: Recent Labs  Lab 04/16/18 1039 04/16/18 1551 04/16/18 2020 04/17/18 0238  TROPONINI 0.03* 0.03* <0.03 0.03*   BNP (last 3 results) No results for input(s): PROBNP in the last 8760 hours. HbA1C: No results for input(s): HGBA1C in the last 72 hours. CBG: No results for input(s): GLUCAP in the last 168 hours. Lipid Profile: No results for input(s): CHOL, HDL, LDLCALC, TRIG, CHOLHDL, LDLDIRECT in the last 72 hours. Thyroid Function Tests:  Recent Labs    04/20/18 0956  TSH 0.690   Anemia Panel: No results for input(s): VITAMINB12, FOLATE, FERRITIN, TIBC, IRON, RETICCTPCT in the last 72 hours. Sepsis Labs: No results for input(s): PROCALCITON, LATICACIDVEN in the last 168 hours.  Recent Results (from the past 240 hour(s))  Urine Culture     Status: Abnormal   Collection Time: 04/18/18 10:54 AM  Result Value Ref Range Status   Specimen Description URINE, RANDOM  Final   Special Requests   Final    NONE Performed at [facility], [address] [place], [place] [address]    Culture MULTIPLE SPECIES PRESENT, SUGGEST RECOLLECTION (A)  Final   Report Status 04/19/2018 FINAL  Final  MRSA PCR Screening     Status: None   Collection Time: 04/19/18 12:21 PM  Result Value Ref Range Status   MRSA by PCR NEGATIVE NEGATIVE Final    Comment:        The GeneXpert MRSA Assay (FDA approved for NASAL specimens only), is one component of a comprehensive MRSA colonization surveillance program. It is not intended to diagnose MRSA infection nor to guide or monitor treatment  for MRSA infections. Performed at [facility], [address], [place],  [address]          Radiology Studies: Dg Chest [place] 1 View  Result Date: 04/19/2018 CLINICAL DATA:  77 year old female with a history of shortness of breath EXAM: PORTABLE CHEST 1 VIEW COMPARISON:  04/16/2018, 02/23/2018, 02/21/2018 FINDINGS: Cardiomediastinal silhouette unchanged in size and contour. Surgical changes of median sternotomy and CABG. Similar appearance of elevation of the left hemidiaphragm with similar appearance of opacity at the left lung base. Linear opacities bilateral lung bases. No pneumothorax. No displaced fracture. IMPRESSION: Similar appearance of the lungs, with linear and patchy opacities at the lung bases, which may represent a combination of scarring/chronic lung changes and/or consolidation. Surgical changes of median sternotomy and CABG. Electronically Signed   By: Corrie Mckusick D.O.   On: 04/19/2018 11:43        Scheduled Meds: . acetaZOLAMIDE  250 mg Oral BID  . amiodarone  200 mg Oral Daily  . calcium-vitamin D  2 tablet Oral Daily  . clonazePAM  0.5 mg Oral QHS  . diclofenac sodium  2 g Topical QID  . docusate sodium  200 mg Oral Daily  . ferrous gluconate  324 mg Oral Daily  . fluticasone  1 spray Each Nare Daily  . furosemide  80 mg Intravenous BID  . ipratropium  1 puff Inhalation Q6H  . isosorbide mononitrate  60 mg Oral Daily  . loratadine  10 mg Oral Daily  . magnesium hydroxide  15 mL Oral Daily  . metoprolol tartrate  25 mg Oral BID  . pantoprazole  40 mg Oral Daily  . potassium chloride SA  40 mEq Oral BID  . Rivaroxaban  15 mg Oral Q supper  . senna  1 tablet Oral Daily  . simvastatin  20 mg Oral q1800  . sodium chloride flush  3 mL Intravenous Q12H  . spironolactone  25 mg Oral Daily   Continuous Infusions: . sodium chloride    . cefTRIAXone (ROCEPHIN)  IV 1 g (04/20/18 1438)     LOS: 4 days    Time spent: 35 minutes    Elmarie Shiley, MD [facility] Pager [phone]  If 7PM-7AM, please contact night-coverage [email or web address] Password [facility] 04/21/2018, 9:39 AM

## 2018-04-22 ENCOUNTER — Inpatient Hospital Stay (HOSPITAL_COMMUNITY): Payer: Medicare Other

## 2018-04-22 DIAGNOSIS — J9612 Chronic respiratory failure with hypercapnia: Secondary | ICD-10-CM

## 2018-04-22 DIAGNOSIS — N183 Chronic kidney disease, stage 3 (moderate): Secondary | ICD-10-CM

## 2018-04-22 DIAGNOSIS — J9621 Acute and chronic respiratory failure with hypoxia: Secondary | ICD-10-CM

## 2018-04-22 DIAGNOSIS — I481 Persistent atrial fibrillation: Secondary | ICD-10-CM

## 2018-04-22 DIAGNOSIS — I5033 Acute on chronic diastolic (congestive) heart failure: Secondary | ICD-10-CM

## 2018-04-22 LAB — BLOOD GAS, ARTERIAL
Acid-Base Excess: 21 mmol/L — ABNORMAL HIGH (ref 0.0–2.0)
BICARBONATE: 47.7 mmol/L — AB (ref 20.0–28.0)
DRAWN BY: 23703
Delivery systems: POSITIVE
Expiratory PAP: 6
FIO2: 40
Inspiratory PAP: 14
O2 SAT: 94.8 %
PO2 ART: 76.8 mmHg — AB (ref 83.0–108.0)
Patient temperature: 98.6
pCO2 arterial: 87.3 mmHg (ref 32.0–48.0)
pH, Arterial: 7.357 (ref 7.350–7.450)

## 2018-04-22 LAB — CBC
HEMATOCRIT: 37.7 % (ref 36.0–46.0)
HEMOGLOBIN: 10.7 g/dL — AB (ref 12.0–15.0)
MCH: 28.8 pg (ref 26.0–34.0)
MCHC: 28.4 g/dL — AB (ref 30.0–36.0)
MCV: 101.3 fL — ABNORMAL HIGH (ref 78.0–100.0)
Platelets: 215 10*3/uL (ref 150–400)
RBC: 3.72 MIL/uL — AB (ref 3.87–5.11)
RDW: 12.6 % (ref 11.5–15.5)
WBC: 8.3 10*3/uL (ref 4.0–10.5)

## 2018-04-22 LAB — BASIC METABOLIC PANEL
Anion gap: 13 (ref 5–15)
BUN: 29 mg/dL — AB (ref 8–23)
CHLORIDE: 86 mmol/L — AB (ref 98–111)
CO2: 41 mmol/L — AB (ref 22–32)
Calcium: 9.6 mg/dL (ref 8.9–10.3)
Creatinine, Ser: 1.35 mg/dL — ABNORMAL HIGH (ref 0.44–1.00)
GFR calc Af Amer: 42 mL/min — ABNORMAL LOW (ref >60)
GFR calc non Af Amer: 37 mL/min — ABNORMAL LOW (ref >60)
Glucose, Bld: 105 mg/dL — ABNORMAL HIGH (ref 70–99)
POTASSIUM: 3.9 mmol/L (ref 3.5–5.1)
SODIUM: 140 mmol/L (ref 135–145)

## 2018-04-22 LAB — URINE CULTURE

## 2018-04-22 LAB — GLUCOSE, CAPILLARY: GLUCOSE-CAPILLARY: 110 mg/dL — AB (ref 70–99)

## 2018-04-22 MED ORDER — ACETAZOLAMIDE ER 500 MG PO CP12
500.0000 mg | ORAL_CAPSULE | Freq: Two times a day (BID) | ORAL | Status: DC
  Administered 2018-04-22 (×2): 500 mg via ORAL
  Filled 2018-04-22 (×4): qty 1

## 2018-04-22 MED ORDER — FOSFOMYCIN TROMETHAMINE 3 G PO PACK
3.0000 g | PACK | Freq: Once | ORAL | Status: AC
  Administered 2018-04-22: 3 g via ORAL
  Filled 2018-04-22: qty 3

## 2018-04-22 MED ORDER — ACETAMINOPHEN 325 MG PO TABS
650.0000 mg | ORAL_TABLET | Freq: Two times a day (BID) | ORAL | Status: DC
  Administered 2018-04-22 – 2018-04-24 (×5): 650 mg via ORAL
  Filled 2018-04-22 (×8): qty 2

## 2018-04-22 MED ORDER — ACETAZOLAMIDE 250 MG PO TABS
500.0000 mg | ORAL_TABLET | Freq: Two times a day (BID) | ORAL | Status: DC
  Filled 2018-04-22: qty 2

## 2018-04-22 NOTE — Progress Notes (Signed)
Pt and family declines SNF rehab and wants to pursue  [facility] services when discharged. CSW will continue to assist as needed.

## 2018-04-22 NOTE — Progress Notes (Addendum)
Pt up to chair at 0830. At 1020 pt found to be severely obtunded and unarousable. SpO2 100% on 3L [place]. O2 decreased to 1.5L [place] with sats maintained above 95%. This RN paged Regalado MD and called respiratory therapist to obtain ABG (results below) and place pt on BiPap. CBG also obtained with results of 110. After repeated sternal rubs and stimulation, pt began to mumble words and eventually open eyes. Pt was moved with assistance back to bed and placed on Bipap. Will continue to monitor pt closely.   Results for [name], [name] (MRN [id number]) as of 04/22/2018 11:12  Ref. Range 04/22/2018 10:17  pH, Arterial Latest Ref Range: 7.350 - 7.450  7.357  pCO2 arterial Latest Ref Range: 32.0 - 48.0 mmHg 87.3 (HH)  pO2, Arterial Latest Ref Range: 83.0 - 108.0 mmHg 76.8 (L)  Acid-Base Excess Latest Ref Range: 0.0 - 2.0 mmol/L 21.0 (H)  Bicarbonate Latest Ref Range: 20.0 - 28.0 mmol/L 47.7 (H)

## 2018-04-22 NOTE — Progress Notes (Signed)
ABG results reported to MD.  

## 2018-04-22 NOTE — Progress Notes (Signed)
Progress Note  Patient Name: [name] Date of Encounter: 04/22/2018  Primary Cardiologist: Dr Shirlee Latch  Subjective   Per nursing patient became unresponsive earlier.  Placed on BiPAP and became more responsive.  She denies chest pain or dyspnea at present.  She is on BiPAP.  She remains somewhat confused.  Inpatient Medications    Scheduled Meds: . acetaminophen  650 mg Oral BID  . acetaZOLAMIDE  500 mg Oral BID  . amiodarone  200 mg Oral Daily  . calcium-vitamin D  2 tablet Oral Daily  . clonazePAM  0.5 mg Oral QHS  . diclofenac sodium  2 g Topical QID  . docusate sodium  200 mg Oral Daily  . ferrous gluconate  324 mg Oral Daily  . fluticasone  1 spray Each Nare Daily  . fosfomycin  3 g Oral Once  . furosemide  80 mg Intravenous BID  . ipratropium  1 puff Inhalation TID  . isosorbide mononitrate  60 mg Oral Daily  . loratadine  10 mg Oral Daily  . magnesium hydroxide  15 mL Oral Daily  . metoprolol tartrate  25 mg Oral BID  . pantoprazole  40 mg Oral Daily  . potassium chloride SA  40 mEq Oral BID  . Rivaroxaban  15 mg Oral Q supper  . senna  1 tablet Oral Daily  . simvastatin  20 mg Oral q1800  . sodium chloride flush  3 mL Intravenous Q12H  . spironolactone  25 mg Oral Daily   Continuous Infusions: . sodium chloride     PRN Meds: acetaminophen, benzonatate, bisacodyl, butalbital-acetaminophen-caffeine, clonazePAM, nitroGLYCERIN, oxymetazoline, sodium chloride flush   Vital Signs    Vitals:   04/22/18 0747 04/22/18 0820 04/22/18 0826 04/22/18 1110  BP: (!) 133/43  139/68   Pulse:    66  Resp: (!) 29   (!) 24  Temp: 97.7 F (36.5 C)     TempSrc: Axillary     SpO2:  100%  99%  Weight:      Height:        Intake/Output Summary (Last 24 hours) at 04/22/2018 1147 Last data filed at 04/22/2018 0700 Gross per 24 hour  Intake 120 ml  Output 850 ml  Net -730 ml   Filed Weights   04/18/18 0500 04/19/18 0522 04/19/18 1535  Weight: 88.1 kg 87.2 kg 86.2 kg     Telemetry    Sinus - Personally Reviewed  Physical Exam   GEN: WD on Bipap Neck: supple Cardiac: RRR Respiratory: mild basilar crackles GI: Soft, NT/ND MS: No edema Neuro:  Moves all ext, mildly confused   Labs    Chemistry Recent Labs  Lab 04/16/18 1039  04/20/18 0719 04/21/18 0104 04/22/18 0239  NA 142   < > 144 142 140  K 3.8   < > 3.8 3.7 3.9  CL 93*   < > 91* 85* 86*  CO2 34*   < > 42* 49* 41*  GLUCOSE 102*   < > 111* 112* 105*  BUN 19   < > 24* 26* 29*  CREATININE 1.31*   < > 1.09* 1.35* 1.35*  CALCIUM 9.1   < > 9.7 9.9 9.6  PROT 6.6  --   --   --   --   ALBUMIN 3.1*  --   --   --   --   AST 15  --   --   --   --   ALT 11  --   --   --   --  ALKPHOS 63  --   --   --   --   BILITOT 0.4  --   --   --   --   GFRNONAA 38*   < > 47* 37* 37*  GFRAA 44*   < > 55* 42* 42*  ANIONGAP 15   < > 11 8 13    < > = values in this interval not displayed.     Hematology Recent Labs  Lab 04/20/18 0301 04/21/18 0104 04/22/18 0239  WBC 9.0 8.6 8.3  RBC 3.73* 3.66* 3.72*  HGB 10.9* 10.6* 10.7*  HCT 38.0 37.3 37.7  MCV 101.9* 101.9* 101.3*  MCH 29.2 29.0 28.8  MCHC 28.7* 28.4* 28.4*  RDW 12.8 12.6 12.6  PLT 207 205 215    Cardiac Enzymes Recent Labs  Lab 04/16/18 1039 04/16/18 1551 04/16/18 2020 04/17/18 0238  TROPONINI 0.03* 0.03* <0.03 0.03*    BNP Recent Labs  Lab 04/16/18 1039  BNP 745.9*     Patient Profile     [name] a 77 y.o.femalewith a history of chronic diastolic CHF, moderate pulmonary HTN, HTN, CAD s/p CABG, COPD on home oxygen, and dyslipidemia. Well known to the HF clinic  Admitted 04/16/18 for SOB and c/o nose bleeds. Hgb stable. BNP and volume status elevated. Also underwent cardioversion for newly diagnosed atrial fibrillation.  Last echocardiogram July 2019 showed normal LV systolic function, mild aortic insufficiency, moderate mitral stenosis and mild to moderate mitral regurgitation, biatrial enlargement, mild  right ventricular enlargement, severe tricuspid regurgitation and moderate pulmonary hypertension.  Assessment & Plan    1 acute on chronic diastolic heart failure-contribution from mitral stenosis/mitral regurgitation. I/O-850.  Volume status difficult to assess.  Still has crackles in her lungs.  Repeat chest x-ray.  We will continue Lasix at present dose for now.  Follow renal function.  2 paroxysmal atrial fibrillation-patient remains in sinus rhythm status post cardioversion.  Continue Xarelto and amiodarone.    3 coronary artery disease status post coronary artery bypass graft-we are treating medically.  Continue statin.  No aspirin given need for anticoagulation.  4 COPD-Per primary care.  5 mitral stenosis-as outlined in previous notes she is not a good candidate for balloon valvuloplasty nor would she be a good surgical candidate.  Medical therapy only option.  6 lung nodule-PET scan as an outpatient.  7 altered mental status-patient with elevated CO2 of 87.  She was placed on BiPAP with improvement in mental status.  Note pH 7.35.  Continue BiPAP.  Aceta Sulamyd added.  For questions or updates, please contact [facility] Please consult [email or web address] for contact info under        Signed, Olga Millers, MD  04/22/2018, 11:47 AM

## 2018-04-22 NOTE — Progress Notes (Addendum)
PROGRESS NOTE    [name]  XHB:[id number] DOB: 11/04/1940 DOA: 04/16/2018 PCP: Kelton Pillar, MD    Brief Narrative: Brief Narrative:  77 year old married female, lives with spouse who is an amputee and wheelchair-bound, patient ambulates with the help of a walker, PMH of chronic diastolic CHF, moderate pulmonary HTN, HTN, HLD, CAD status post CABG, PAF on Xarelto, COPD, chronic hypoxic respiratory failure on home oxygen (2 L at rest and 3 L with activity), anxiety and depression, GERD, negative sleep study 02/22/2016, followed at the HF clinic, last seen in HF clinic 03/22/2018 when given extra dose of torsemide, presented to [facility] ED 04/16/2018 due to progressively worsening dyspnea, significantly decreased effort tolerance over the last month or 2, intermittent chest tightness and intermittent epistaxis.  Admitted for acute on chronic diastolic CHF, acute on chronic hypoxic respiratory failure. HF Cardiology consulted.   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  Patient  lasix  80 mg  BID IV.  S/P cardioversion 9-25.  Patient more SOB on 9-26 , Increase work of breathing. She will be transfer to step down on BIPAP, klonopin dose given.  Repeated chest x ray, stable changes.  CO 2 49 --decreased to 41.  Will reorder diamox for today .  Patient didn't tolerated BIPAP/  Weight down today 190. On 3 L oxygen today. Cardiology managing diuretics.   2-UTI; UA with 12-20  WBC, hematuria.  Follow urine culture. Culture 20,000 colonies enterococcus.  Received 4 days of ceftriaxone. Urine culture growing enterococcus. Will use fosfomycin.   3-COPD;  On ipratropium. Does not tolerates albuterol, xopenex.  Report no headaches today, has use ipratropium.  I spoke with Dr Annamaria Boots, we could try Atrovent hfa. Report headache today, will change to Atrovent HFA. Appreciate pharmacyst help.   4-Headache;  For last week.  ? Worse after ipratropium . Discussed with pulmonary , could try Spiriva, ellipta. Patient report intolerance to those medications.  CT head negative.  change Atrovent to HFA> to see if that helps with headaches. Monitor response.  Still with headaches, Neck pain. Will schedule tylenol, heating pad.   Encephalopathy; metabolic vs toxic.  Patient notice to be confuse at times by family. She keeps eyes close, answer questions. Per son during morning patient gets more sleepy and confuse. Yesterday afternoon she became more alert.  Suspect delirium from acute illness, UTI>  Increase CO 2 might play role, but CO 2 on B-met has decreased to 41 from 49. And patient gets more alert in the afternoon.  Called by nurse at [facility] patient difficult to arouse. Subsequently came back after sternal rub.  I came again and evaluated patient. She open her eyes, follows some command, relates ear pain has resolved.  Patient will be place on BIPAP. Son mention this am, if she has to be on BIPAP ok. Will get Blood gas.  -ABG; PCO 2; 87, Po2 76, will keep BIPAP for now and have consulted CCM for recommendations.   Acute on  chronic hypoxic respiratory failure Multifactorial. Was on BIPAP transiently. Patient does not like to use  On IV lasix.  Component of anxiety also.   Paroxysmal A fib;  DCCV 9-25 Was on amiodarone gtt.  On xarelto.   Epistaxis: Intermittent, likely due to Xarelto and home oxygen use.  Insure humidified oxygen.  If worsens, may need ENT  consultation.  CAD status post CABG: Per cardiology, 90% stenosis and distal LCx, not amenable to PCI.  Currently without anginal symptoms.  Troponins minimally elevated in the 0.03 range and flat trend.  Continue Imdur, metoprolol and simvastatin.  No aspirin due to Xarelto  Rheumatic mitral stenosis, moderate: As per cardiology, poor candidate for mitral valvuloplasty or surgery.  Hence medical management.  Stage III CKD; baseline cr 1.3.  Monitor.     DVT prophylaxis: xarelto Code Status: DNR Family Communication: Son  at bedside.  Disposition Plan: remain in patient for treatment of heart failure, UTI, delirium   Consultants:   Cardiology    Procedures:   Cardioversion 9-25   Antimicrobials:   none   Subjective: This am she was answering questions, eyes close. Son relates yesterday morning was the sam and in the afternoon she wake up.  Nurse gave her earlier klonopin due to anxiety. Patient was complaining of neck pain.   Objective: Vitals:   04/21/18 2346 04/22/18 0747 04/22/18 0820 04/22/18 0826  BP: (!) 118/53 (!) 133/43  139/68  Pulse: 64     Resp: (!) 36 (!) 29    Temp: 97.7 F (36.5 C) 97.7 F (36.5 C)    TempSrc: Axillary Axillary    SpO2: (!) 84%  100%   Weight:      Height:        Intake/Output Summary (Last 24 hours) at 04/22/2018 0920 Last data filed at 04/22/2018 0700 Gross per 24 hour  Intake 210 ml  Output 1300 ml  Net -1090 ml   Filed Weights   04/18/18 0500 04/19/18 0522 04/19/18 1535  Weight: 88.1 kg 87.2 kg 86.2 kg    Examination:  General exam: sleepy , but would answer questions.  Respiratory system: Crackles bases.  Cardiovascular system: S 1,  S 2 Gastrointestinal system: BS present, soft, nt Central nervous system: sleepy follows command, keep eyes close.  Extremities: trace edema Skin: no rash      Data Reviewed: I have personally reviewed following labs and imaging studies  CBC: Recent Labs  Lab  04/16/18 1039 04/19/18 0452 04/20/18 0301 04/21/18 0104 04/22/18 0239  WBC 9.0 9.7 9.0 8.6 8.3  NEUTROABS 6.7  --   --   --   --   HGB 10.7* 10.8* 10.9* 10.6* 10.7*  HCT 37.0 37.2 38.0 37.3 37.7  MCV 101.1* 102.5* 101.9* 101.9* 101.3*  PLT 223 213 207 205 256   Basic Metabolic Panel: Recent Labs  Lab 04/18/18 0445 04/19/18 0452 04/20/18 0719 04/21/18 0104 04/22/18 0239  NA 143 145 144 142 140  K 4.2 4.7 3.8 3.7 3.9  CL 95* 96* 91* 85* 86*  CO2 39* 39* 42* 49* 41*  GLUCOSE 102* 108* 111* 112* 105*  BUN 31* 29* 24* 26* 29*  CREATININE 1.31* 1.28* 1.09* 1.35* 1.35*  CALCIUM 10.0 9.4 9.7 9.9 9.6   GFR: Estimated Creatinine Clearance: 38 mL/min (A) (by C-G formula based on SCr of 1.35 mg/dL (H)). Liver Function Tests: Recent Labs  Lab 04/16/18 1039  AST 15  ALT 11  ALKPHOS 63  BILITOT 0.4  PROT 6.6  ALBUMIN 3.1*   No results for input(s): LIPASE, AMYLASE in the last 168 hours. No results for input(s): AMMONIA in the last 168 hours. Coagulation Profile: No results for input(s): INR, PROTIME in the last 168 hours. Cardiac Enzymes: Recent Labs  Lab 04/16/18 1039 04/16/18 1551 04/16/18 2020 04/17/18 0238  TROPONINI 0.03* 0.03* <0.03 0.03*   BNP (last 3 results) No results for input(s): PROBNP in the last 8760 hours. HbA1C: No results for input(s): HGBA1C in the last 72 hours. CBG: No results for input(s): GLUCAP in the last 168 hours. Lipid Profile: No results for input(s): CHOL, HDL, LDLCALC, TRIG, CHOLHDL, LDLDIRECT in the last 72 hours. Thyroid Function Tests: Recent Labs    04/20/18 0956  TSH 0.690   Anemia Panel: No results for input(s): VITAMINB12, FOLATE, FERRITIN, TIBC, IRON, RETICCTPCT in the last 72 hours. Sepsis Labs: No results for input(s): PROCALCITON, LATICACIDVEN in the last 168 hours.  Recent Results (from the past 240 hour(s))  Urine Culture     Status: Abnormal   Collection Time: 04/18/18 10:54 AM  Result Value Ref Range Status    Specimen Description URINE, RANDOM  Final   Special Requests   Final    NONE Performed at [facility], [address] [place], [place] [address]    Culture MULTIPLE SPECIES PRESENT, SUGGEST RECOLLECTION (A)  Final   Report Status 04/19/2018 FINAL  Final  MRSA PCR Screening     Status: None   Collection Time: 04/19/18 12:21 PM  Result Value Ref Range Status   MRSA by PCR NEGATIVE NEGATIVE Final    Comment:        The GeneXpert MRSA Assay (FDA approved for NASAL specimens only), is one component of a comprehensive MRSA colonization surveillance program. It is not intended to diagnose MRSA infection nor to guide or monitor treatment for MRSA infections. Performed at [facility], [place] [address], [place], [place] [address]   Urine Culture     Status: Abnormal   Collection Time: 04/20/18  4:55 PM  Result Value Ref Range Status   Specimen Description URINE, CLEAN CATCH  Final   Special Requests   Final    NONE Performed at [facility], [place] [address] [place], [place] [address]    Culture 20,000 COLONIES/mL ENTEROCOCCUS FAECALIS (A)  Final   Report Status 04/22/2018 FINAL  Final   Organism ID, Bacteria ENTEROCOCCUS FAECALIS (A)  Final      Susceptibility   Enterococcus faecalis - MIC*    AMPICILLIN <=2 SENSITIVE Sensitive     LEVOFLOXACIN 0.5 SENSITIVE Sensitive     NITROFURANTOIN <=16 SENSITIVE Sensitive     VANCOMYCIN 2 SENSITIVE Sensitive     * 20,000 COLONIES/mL ENTEROCOCCUS FAECALIS         Radiology Studies: No results found.      Scheduled Meds: . acetaminophen  650 mg Oral BID  . amiodarone  200 mg Oral Daily  . calcium-vitamin D  2 tablet Oral Daily  . clonazePAM  0.5 mg Oral QHS  . diclofenac sodium  2 g Topical QID  . docusate sodium  200 mg Oral Daily  . ferrous gluconate  324 mg Oral Daily  . fluticasone  1 spray Each Nare Daily  . furosemide  80 mg Intravenous BID  . ipratropium  1 puff Inhalation TID  . isosorbide  mononitrate  60 mg Oral Daily  . loratadine  10 mg Oral Daily  . magnesium hydroxide  15 mL  Oral Daily  . metoprolol tartrate  25 mg Oral BID  . pantoprazole  40 mg Oral Daily  . potassium chloride SA  40 mEq Oral BID  . Rivaroxaban  15 mg Oral Q supper  . senna  1 tablet Oral Daily  . simvastatin  20 mg Oral q1800  . sodium chloride flush  3 mL Intravenous Q12H  . spironolactone  25 mg Oral Daily   Continuous Infusions: . sodium chloride    . cefTRIAXone (ROCEPHIN)  IV 1 g (04/21/18 1700)     LOS: 5 days    Time spent: 35 minutes    Elmarie Shiley, MD [facility] Pager [phone]  If 7PM-7AM, please contact night-coverage [email or web address] Password [email or web address] 04/22/2018, 9:20 AM

## 2018-04-22 NOTE — Consult Note (Signed)
Name: [name] MRN: [id number] DOB: [birth year]    ADMISSION DATE:  04/16/2018 CONSULTATION DATE: April 22, 2018  REFERRING MD : Hospitalist service  CHIEF COMPLAINT: Abnormal ABG  BRIEF PATIENT DESCRIPTION: PCO2 in the 80s with pH is 81.7 77 year old with acute on top of coronary chronic congestive heart failure  SIGNIFICANT EVENTS  Patient is on BiPAP was given Klonopin before she became altered    HISTORY OF PRESENT ILLNESS: Patient is a 77 year old Caucasian female past medical history of chronic hypoxia acute chronic respiratory failure uses 3 L of oxygen thyroidectomy a chronic diastolic heart failure mitral valve stenosis paroxysmal A. fib on Xarelto who presents with worsening dyspnea was given diuretics patient also takes Klonopin at home this morning she was given dose of Klonopin she became very altered and ABG was checked which showed PCO2 of 78 or 80s and put on BiPAP by the time I saw the patient patient was more interactive more awake and still confused.  Family is denying any fever chills rigors nausea vomiting diarrhea patient was given Lasix and now she is on Lasix and Diamox.  PAST MEDICAL HISTORY :   has a past medical history of Acute parotitis (01/05/2016), Anxiety, Bacteremia, escherichia coli, Bursitis, shoulder, CHF (congestive heart failure) (HCC), Complication of anesthesia, COPD (chronic obstructive pulmonary disease) (HCC), Coronary heart disease (2001), Depression, DJD (degenerative joint disease), Dyspnea, GERD (gastroesophageal reflux disease), H/O hiatal hernia, History of rheumatic heart disease, Hyperlipidemia, Hypertension, OA (osteoarthritis), On home oxygen therapy, PUD (peptic ulcer disease), Pulmonary HTN (HCC), Sleep apnea, Thyroid nodule, Urinary incontinence, Urine incontinence, and Varicose veins.  has a past surgical history that includes Bilateral salpingoophorectomy; Vaginal delivery (x4); Thyroidectomy, partial (2010); Tubal ligation;  Total abdominal hysterectomy (1990); Coronary artery bypass graft (06/21/2000); Cardiac catheterization (02/11/2008, 01/30/2004, 06/14/2000); Incontinence surgery; Cardiac catheterization (N/A, 09/24/2015); TEE without cardioversion (N/A, 09/24/2015); Cardiac catheterization (N/A, 02/08/2016); Cardioversion (N/A, 06/12/2016); RIGHT HEART CATH (N/A, 02/26/2018); and Cardioversion (N/A, 04/18/2018). Prior to Admission medications   Medication Sig Start Date End Date Taking? Authorizing Provider  acetaminophen (TYLENOL) 500 MG tablet Take 1,000 mg by mouth 2 (two) times daily as needed for headache.    Yes [provider]  amiodarone (PACERONE) 200 MG tablet Take 1 tablet (200 mg total) by mouth daily. 03/21/18  Yes Bensimhon, Bevelyn Buckles, MD  calcium-vitamin D (OSCAL WITH D) 500-200 MG-UNIT per tablet Take 2 tablets by mouth daily.    Yes [provider]  clonazePAM (KLONOPIN) 0.5 MG tablet Take 1 tablet (0.5 mg total) by mouth at bedtime. 02/01/18  Yes Meredeth Ide, MD  diclofenac sodium (VOLTAREN) 1 % GEL Apply 2 g topically daily as needed (pain). 02/01/18  Yes Meredeth Ide, MD  docusate sodium (COLACE) 100 MG capsule Take 200 mg by mouth as needed for mild constipation.   Yes [provider]  ferrous gluconate (FERGON) 324 MG tablet Take 324 mg by mouth daily.   Yes [provider]  ipratropium (ATROVENT) 0.02 % nebulizer solution INHALE THE CONTENTS OF 1  VIAL VIA NEBULIZER 4 TIMES  DAILY Patient taking differently: Take 0.5 mg by nebulization every 6 (six) hours as needed for wheezing or shortness of breath.  03/06/18  Yes Michele Mcalpine, MD  isosorbide mononitrate (IMDUR) 60 MG 24 hr tablet Take 60 mg by mouth daily.    Yes [provider]  loratadine (CLARITIN) 10 MG tablet Take 10 mg by mouth daily.    Yes [provider]  metoprolol  tartrate (LOPRESSOR) 50 MG tablet Take 1 tablet (50 mg total) by mouth 2 (two) times daily. 09/01/17  Yes Laurey Morale, MD    nitroGLYCERIN (NITROSTAT) 0.4 MG SL tablet place 1 tablet under the tongue if needed every 5 minutes for chest pain for 3 doses IF NO RELIEF AFTER 3RD DOSE CALL PRESCRIBER OR 911. 03/22/18  Yes Graciella Freer, PA-C  omeprazole (PRILOSEC) 40 MG capsule Take 40 mg by mouth daily. 11/22/17  Yes [provider]  OXYGEN Inhale 2-3 L into the lungs continuous. At rest 2 liters  Moving around 3 liters   Yes [provider]  potassium chloride SA (K-DUR,KLOR-CON) 20 MEQ tablet Take 2 tablets (40 mEq total) by mouth 2 (two) times daily. 03/30/18  Yes Laurey Morale, MD  Propylene Glycol (SYSTANE BALANCE) 0.6 % SOLN Apply 1 drop to eye as needed (sensitivity).   Yes [provider]  Rivaroxaban (XARELTO) 15 MG TABS tablet Take 1 tablet (15 mg total) by mouth daily with supper. 10/26/17  Yes Laurey Morale, MD  simvastatin (ZOCOR) 20 MG tablet TAKE 3 TABLETS BY MOUTH  DAILY AT 6 PM Patient taking differently: Take 60 mg by mouth daily at 6 PM.  05/30/17  Yes Laurey Morale, MD  spironolactone (ALDACTONE) 25 MG tablet TAKE 1 TABLET BY MOUTH  DAILY 05/30/17  Yes Laurey Morale, MD  torsemide (DEMADEX) 20 MG tablet Take 4 tablets (80 mg total) by mouth 2 (two) times daily. 02/12/18  Yes Laurey Morale, MD  benzonatate (TESSALON) 200 MG capsule Take 1 capsule (200 mg total) by mouth 3 (three) times daily as needed for cough. Patient not taking: Reported on 04/16/2018 08/26/16   Tonye Becket D, NP  Glycopyrrolate-Formoterol (BEVESPI AEROSPHERE) 9-4.8 MCG/ACT AERO Inhale 2 puffs into the lungs 2 (two) times daily. Patient not taking: Reported on 04/16/2018 04/06/18   Waymon Budge, MD  Glycopyrrolate-Formoterol (BEVESPI AEROSPHERE) 9-4.8 MCG/ACT AERO Inhale 2 puffs into the lungs 2 (two) times daily. Patient not taking: Reported on 04/16/2018 04/06/18   Waymon Budge, MD   Allergies  Allergen Reactions  . Levalbuterol Other (See Comments)    Made mouth and throat sore Made  mouth and throat sore  . Lisinopril Cough    Developed persistent dry cough for a month.   . Tape Other (See Comments)    Bleeding  . Cefpodoxime Other (See Comments)    Yeast infections   . Codeine Other (See Comments)    Anxious/ jittery  . Lipitor [Atorvastatin] Other (See Comments)    Made joint start hurting/ Muscle aches  . Other Swelling    Venholin HFA  . Penicillins Other (See Comments)    childhood reaction - pt has tolerated ceftriaxone Has patient had a PCN reaction causing immediate rash, facial/tongue/throat swelling, SOB or lightheadedness with hypotension: unknown Has patient had a PCN reaction causing severe rash involving mucus membranes or skin necrosis: No Has patient had a PCN reaction that required hospitalization No Has patient had a PCN reaction occurring within the last 10 years: No If all of the above answers are "NO", then may proceed with Cephalosporin use.   Terald Sleeper [Kdc:Albuterol] Other (See Comments)    "jittery"  . Celexa [Citalopram Hydrobromide]   . Lyrica [Pregabalin]   . Anoro Ellipta [Umeclidinium-Vilanterol] Itching  . Clindamycin Other (See Comments)    Unknown   . Hydrocodone-Acetaminophen Anxiety  . Latex Itching and Rash    FAMILY HISTORY:  family history includes Anemia in her mother; Emphysema in her father; Esophageal cancer in her daughter. SOCIAL HISTORY:  reports that she quit smoking about 10 years ago. Her smoking use included cigarettes. She has a 47.00 pack-year smoking history. She has never used smokeless tobacco. She reports that she does not drink alcohol or use drugs.  REVIEW OF SYSTEMS:   Constitutional: Negative for fever, chills, weight loss, malaise/fatigue and diaphoresis.  HENT: Negative for hearing loss, ear pain, nosebleeds, congestion, sore throat, neck pain, tinnitus and ear discharge.   Eyes: Negative for blurred vision, double vision, photophobia, pain, discharge and redness.  Respiratory: Negative for  cough, hemoptysis, sputum production, shortness of breath, wheezing and stridor.   Cardiovascular: Negative for chest pain, palpitations, orthopnea, claudication, leg swelling and PND.  Gastrointestinal: Negative for heartburn, nausea, vomiting, abdominal pain, diarrhea, constipation, blood in stool and melena.  Genitourinary: Negative for dysuria, urgency, frequency, hematuria and flank pain.  Musculoskeletal: Negative for myalgias, back pain, joint pain and falls.  Skin: Negative for itching and rash.  Neurological: Negative for dizziness, tingling, tremors, sensory change, speech change, focal weakness, seizures, loss of consciousness, weakness and headaches.  Endo/Heme/Allergies: Negative for environmental allergies and polydipsia. Does not bruise/bleed easily.  SUBJECTIVE:   VITAL SIGNS: Temp:  [97.7 F (36.5 C)-97.8 F (36.6 C)] 97.7 F (36.5 C) (09/29 0747) Pulse Rate:  [61-66] 66 (09/29 1110) Resp:  [24-36] 24 (09/29 1110) BP: (110-139)/(43-68) 139/68 (09/29 0826) SpO2:  [84 %-100 %] 99 % (09/29 1110)  PHYSICAL EXAMINATION: General: Patient is confused on BiPAP denying any pain Neuro:  WNL , diffuse denying any pain, EOMI , CN II-XII intact , UL , LL strength is symmetrical and 5/5 HEENT:  atraumatic , no jaundice , dry mucous membranes  Cardiovascular:  Irregular irregular , ESM 2/6 in the aortic area patient has A. fib Lungs: Bilateral inspiratory crackles with no wheezing Abdomen:  Soft lax +BS , no tenderness . Musculoskeletal:  WNL , normal pulses  Skin:  No rash    Recent Labs  Lab 04/20/18 0719 04/21/18 0104 04/22/18 0239  NA 144 142 140  K 3.8 3.7 3.9  CL 91* 85* 86*  CO2 42* 49* 41*  BUN 24* 26* 29*  CREATININE 1.09* 1.35* 1.35*  GLUCOSE 111* 112* 105*   Recent Labs  Lab 04/20/18 0301 04/21/18 0104 04/22/18 0239  HGB 10.9* 10.6* 10.7*  HCT 38.0 37.3 37.7  WBC 9.0 8.6 8.3  PLT 207 205 215   No results found.  ASSESSMENT / PLAN:  --Severe  hypercapnic respiratory failure chronic --Severe hypoxia --Pulmonary nodule --History of chronic respiratory failure with use of oxygen 3 L at home --Mitral valve stenosis paroxysmal A. 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   --From previous ABGs patient lives around 70-80 on the arterial blood gas PCO2 when her PCO2 in May of this year came down to around 40 she became very alkalotic so that her baseline so please no sedatives or narcotics or benzos to put her on BiPAP as the patient lives around 70-80 on the ABG for PCO2 which is well compensated and well-tolerated. --For severe hypercapnic respiratory failure continue with BiPAP as tolerated avoid narcotics. --Pulmonary edema with chronic congestive heart failure acute on top chronic with mitral stenosis continue with diuresis agree with adding the Diamox. --Chronic hypoxemic respiratory failure continue with oxygen keep oxygen of around 88 to 92%.  O2 saturation patient Will not tolerate higher oxygen  saturation levels that she will go narcosis as she is used to being COPD and chronic use of oxygen. --For pulmonary nodule follow-up as an outpatient. --All other issues are managed by the primary team.   Pulmonary and Critical Care Medicine [facility] Pager: [phone]  04/22/2018, 12:41 PM

## 2018-04-23 LAB — CBC WITH DIFFERENTIAL/PLATELET
Abs Immature Granulocytes: 0 10*3/uL (ref 0.0–0.1)
BASOS ABS: 0.1 10*3/uL (ref 0.0–0.1)
BASOS PCT: 1 %
EOS ABS: 0.3 10*3/uL (ref 0.0–0.7)
Eosinophils Relative: 5 %
HCT: 37.4 % (ref 36.0–46.0)
Hemoglobin: 10.6 g/dL — ABNORMAL LOW (ref 12.0–15.0)
IMMATURE GRANULOCYTES: 0 %
Lymphocytes Relative: 14 %
Lymphs Abs: 1 10*3/uL (ref 0.7–4.0)
MCH: 28.9 pg (ref 26.0–34.0)
MCHC: 28.3 g/dL — AB (ref 30.0–36.0)
MCV: 101.9 fL — ABNORMAL HIGH (ref 78.0–100.0)
Monocytes Absolute: 1.1 10*3/uL — ABNORMAL HIGH (ref 0.1–1.0)
Monocytes Relative: 16 %
NEUTROS PCT: 64 %
Neutro Abs: 4.5 10*3/uL (ref 1.7–7.7)
PLATELETS: 232 10*3/uL (ref 150–400)
RBC: 3.67 MIL/uL — AB (ref 3.87–5.11)
RDW: 12.5 % (ref 11.5–15.5)
WBC: 7 10*3/uL (ref 4.0–10.5)

## 2018-04-23 LAB — BLOOD GAS, ARTERIAL
ACID-BASE EXCESS: 18.1 mmol/L — AB (ref 0.0–2.0)
BICARBONATE: 43.1 mmol/L — AB (ref 20.0–28.0)
DRAWN BY: 535471
Delivery systems: POSITIVE
Expiratory PAP: 6
FIO2: 0.3
Inspiratory PAP: 14
LHR: 12 {breaths}/min
O2 SAT: 97.4 %
PATIENT TEMPERATURE: 98.6
PCO2 ART: 58.1 mmHg — AB (ref 32.0–48.0)
PO2 ART: 81.7 mmHg — AB (ref 83.0–108.0)
pH, Arterial: 7.484 — ABNORMAL HIGH (ref 7.350–7.450)

## 2018-04-23 LAB — COMPREHENSIVE METABOLIC PANEL
ALBUMIN: 3 g/dL — AB (ref 3.5–5.0)
ALT: 19 U/L (ref 0–44)
ANION GAP: 9 (ref 5–15)
AST: 20 U/L (ref 15–41)
Alkaline Phosphatase: 58 U/L (ref 38–126)
BUN: 33 mg/dL — AB (ref 8–23)
CHLORIDE: 89 mmol/L — AB (ref 98–111)
CO2: 42 mmol/L — AB (ref 22–32)
Calcium: 9.3 mg/dL (ref 8.9–10.3)
Creatinine, Ser: 1.46 mg/dL — ABNORMAL HIGH (ref 0.44–1.00)
GFR calc Af Amer: 39 mL/min — ABNORMAL LOW (ref >60)
GFR calc non Af Amer: 33 mL/min — ABNORMAL LOW (ref >60)
GLUCOSE: 90 mg/dL (ref 70–99)
POTASSIUM: 4 mmol/L (ref 3.5–5.1)
SODIUM: 140 mmol/L (ref 135–145)
Total Bilirubin: 0.6 mg/dL (ref 0.3–1.2)
Total Protein: 6.6 g/dL (ref 6.5–8.1)

## 2018-04-23 MED ORDER — FENTANYL CITRATE (PF) 100 MCG/2ML IJ SOLN: 12.5000 ug | INTRAMUSCULAR | Status: DC | PRN

## 2018-04-23 MED ORDER — ACETAZOLAMIDE 250 MG PO TABS
500.0000 mg | ORAL_TABLET | Freq: Two times a day (BID) | ORAL | Status: DC
  Administered 2018-04-23 – 2018-04-24 (×4): 500 mg via ORAL
  Filled 2018-04-23 (×5): qty 2

## 2018-04-23 MED ORDER — ACETAZOLAMIDE SODIUM 500 MG IJ SOLR
500.0000 mg | Freq: Once | INTRAMUSCULAR | Status: DC
  Filled 2018-04-23: qty 500

## 2018-04-23 MED ORDER — ALPRAZOLAM 0.5 MG PO TABS
1.0000 mg | ORAL_TABLET | ORAL | Status: DC | PRN
  Administered 2018-04-23 – 2018-04-25 (×3): 1 mg via ORAL
  Filled 2018-04-23 (×4): qty 2

## 2018-04-23 MED ORDER — IPRATROPIUM BROMIDE 0.02 % IN SOLN
0.5000 mg | Freq: Four times a day (QID) | RESPIRATORY_TRACT | Status: DC
  Administered 2018-04-23 – 2018-04-25 (×9): 0.5 mg via RESPIRATORY_TRACT
  Filled 2018-04-23 (×10): qty 2.5

## 2018-04-23 NOTE — Progress Notes (Signed)
Name: [name] MRN: [id number] DOB: Jan 30, 1941    ADMISSION DATE:  04/16/2018 CONSULTATION DATE: April 22, 2018  REFERRING MD : Hospitalist service  CHIEF COMPLAINT: Abnormal ABG  BRIEF PATIENT DESCRIPTION: PCO2 in the 80s with pH is 70.19 77 year old with acute on top of coronary chronic congestive heart failure  SIGNIFICANT EVENTS  Wore BiPAP 9/29 overnight   HISTORY OF PRESENT ILLNESS: Patient is a 77 year old Caucasian female past medical history of chronic hypoxia acute chronic respiratory failure uses 3 L of oxygen thyroidectomy a chronic diastolic heart failure mitral valve stenosis paroxysmal A. fib on Xarelto who presents with worsening dyspnea was given diuretics patient also takes Klonopin at home this morning she was given dose of Klonopin she became very altered and ABG was checked which showed PCO2 of 78 or 80s and put on BiPAP by the time I saw the patient patient was more interactive more awake and still confused.  Family is denying any fever chills rigors nausea vomiting diarrhea patient was given Lasix and now she is on Lasix and Diamox.  SUBJECTIVE / Interval events:  Pt wore BiPAP o/n, did not tolerate well - tells me that she would not want it back on. \ She was hypercapneic this am but has a significant improvement in her pCO2 on most recent ABG.    VITAL SIGNS: Temp:  [97.4 F (36.3 C)-99 F (37.2 C)] 97.8 F (36.6 C) (09/30 1156) Pulse Rate:  [56-69] 65 (09/30 1156) Resp:  [19-37] 37 (09/30 1156) BP: (92-123)/(35-72) 117/46 (09/30 1156) SpO2:  [91 %-100 %] 97 % (09/30 1156) FiO2 (%):  [25 %] 25 % (09/30 0415) Weight:  [85.1 kg-88.4 kg] 85.1 kg (09/30 0300)  PHYSICAL EXAMINATION: General: Patient is sleepy but wakes to voice.  Neuro: awake, speaks clearly, follows commands.  HEENT: OP dry, no lesions.  Cardiovascular:  Irreg irreg, 2/6 M Lungs: scattered B insp crackles.  Abdomen:  Soft, benign Skin:  No rash    Recent Labs  Lab  04/21/18 0104 04/22/18 0239 04/23/18 0326  NA 142 140 140  K 3.7 3.9 4.0  CL 85* 86* 89*  CO2 49* 41* 42*  BUN 26* 29* 33*  CREATININE 1.35* 1.35* 1.46*  GLUCOSE 112* 105* 90   Recent Labs  Lab 04/21/18 0104 04/22/18 0239 04/23/18 0326  HGB 10.6* 10.7* 10.6*  HCT 37.3 37.7 37.4  WBC 8.6 8.3 7.0  PLT 205 215 232   Dg Chest Port 1v Same Day  Result Date: 04/22/2018 CLINICAL DATA:  CHF EXAM: PORTABLE CHEST 1 VIEW COMPARISON:  04/19/2018 FINDINGS: Mild bilateral interstitial thickening. Bibasilar basilar airspace disease likely reflecting atelectasis and/or scarring. No significant pleural effusion. No pneumothorax. Stable cardiomediastinal silhouette. Prior CABG. IMPRESSION: 1. Mild pulmonary vascular congestion. 2. Bibasilar basilar airspace disease likely reflecting atelectasis and/or scarring. Electronically Signed   By: Elige Ko   On: 04/22/2018 14:35    ASSESSMENT / PLAN: Multifactorial acute on chronic resp failure with hypercapnia. Suspect that her sedating meds, her relative alkalosis (in setting diuresis), possibly a component inefficient ventilation on higher FiO2, all contributing. She improved w BiPAP, may not need it further. She is very clear that she does not want to wear it, would refuse if offered. Agree with the diamox as she is diuresed. Her metabolic alkalosis will allow pCO2 to creep up.   She appears to be stabilizing, but suspect that she and family would opt to forgo more BiPAP even if she were to decompensate.  Please call if we can assist.    Levy Pupa, MD, PhD 04/23/2018, 3:21 PM [facility] [phone] or if no answer [phone]

## 2018-04-23 NOTE — Progress Notes (Signed)
PROGRESS NOTE    [name]  KPT:[id number] DOB: 02-Jun-1941 DOA: 04/16/2018 PCP: Maurice Small, MD    Brief Narrative: Brief Narrative:  77 year old married female, lives with spouse who is an amputee and wheelchair-bound, patient ambulates with the help of a walker, PMH of chronic diastolic CHF, moderate pulmonary HTN, HTN, HLD, CAD status post CABG, PAF on Xarelto, COPD, chronic hypoxic respiratory failure on home oxygen (2 L at rest and 3 L with activity), anxiety and depression, GERD, negative sleep study 02/22/2016, followed at the HF clinic, last seen in HF clinic 03/22/2018 when given extra dose of torsemide, presented to [facility] ED 04/16/2018 due to progressively worsening dyspnea, significantly decreased effort tolerance over the last month or 2, intermittent chest tightness and intermittent epistaxis.  Admitted for acute on chronic diastolic CHF, acute on chronic hypoxic respiratory failure. HF Cardiology consulted.   Assessment & Plan:   Active Problems:   Chronic respiratory failure with hypercapnia (HCC)   Persistent atrial fibrillation (HCC)   Acute on chronic diastolic CHF (congestive heart failure) (HCC)   CHF exacerbation (HCC)   Acute on chronic respiratory failure with hypoxia (HCC)   AF (paroxysmal atrial fibrillation) (HCC)   CKD (chronic kidney disease), stage III (HCC)  1-Acute on chronic diastolic HF; exacerbation;  TTE 01/30/2018: LVEF 60-65%, unable to assess LV diastolic function, RV volume overload, moderate MS severe TR and PA peak pressure 62 mmHg. Right ventricular failure due to Moderate MS and severe TR.  S/P cardioversion 9-25.  Patient more SOB on 9-26 , Increase work of breathing. She will be transfer to step down on BIPAP. Repeated chest x ray, stable changes.  Treated with IV lasix.  weight down 195----187.  Plan to transition to oral diuretic tomorrow continue with Diamox.   2-Acute on chronic hypoxic, hypercapnic Respiratory failure;  Component of  anxiety. On IV lasix.  Called by nurse at 10;30 patient difficult to arouse on 9-29. Patient subsequently respond after sternal rub.  Patient was place on BIPAP. -ABG; PCO 2; 87, Po2 76, CCM consulted.  BIPAP removed last night, received klonopin. Repeated blood gas PH 7.2, POC 2 97. Placed on BIPAP.  Plan to repeat ABG, remove BIPAP if CO2 lower.  Discussed palliative care consult with son, he would like to speak with Dr Shirlee Latch first.  Discontinue Klonopin.    UTI; UA with 12-20 WBC, hematuria.  Follow urine culture. Culture 20,000 colonies enterococcus.  Received 4 days of ceftriaxone. Urine culture growing enterococcus. Received a dose of  fosfomycin.   3-COPD;  On ipratropium. Does not tolerates albuterol, xopenex.  Report no headaches today, has use ipratropium.  I spoke with Dr Maple Hudson, we could try Atrovent hfa. Report headache today, will change to Atrovent HFA. Appreciate pharmacyst help.   4-Headache;  For last week.  ? Worse after ipratropium . Discussed with pulmonary , could try Spiriva, ellipta. Patient report intolerance to those medications.  CT head negative.  change Atrovent to HFA> to see if that helps with headaches. Monitor response.  Schedule tylenol/  Denies headaches today.   Encephalopathy; metabolic vs toxic.  Suspect delirium from acute illness, UTI> and hypercapnic respiratory failure.  On BIPAP.    Paroxysmal A fib;  DCCV 9-25 Was on amiodarone gtt.  On xarelto.   Epistaxis: Intermittent, likely due to Xarelto and home oxygen use.  Insure humidified oxygen.  If worsens, may need ENT consultation.  CAD status post CABG: Per cardiology, 90% stenosis and distal LCx, not amenable to  PCI.  Currently without anginal symptoms.  Troponins minimally elevated in the 0.03 range and flat trend.  Continue Imdur, metoprolol and simvastatin.  No aspirin due to Xarelto  Rheumatic mitral stenosis, moderate: As per cardiology, poor candidate for mitral  valvuloplasty or surgery.  Hence medical management.  Stage III CKD; baseline cr 1.3.  Monitor.     DVT prophylaxis: xarelto Code Status: DNR Family Communication: Son  at bedside.  Disposition Plan: palliative care consult   Consultants:   Cardiology    Procedures:   Cardioversion 9-25   Antimicrobials:   none   Subjective: She is on BIPAP. Denies pain. Wants to be off BIPAP.    Objective: Vitals:   04/23/18 0358 04/23/18 0415 04/23/18 0756 04/23/18 0908  BP: (!) 115/42  (!) 117/54   Pulse: (!) 56 61 62 (!) 57  Resp: (!) 29 (!) 26 19 (!) 26  Temp: 97.8 F (36.6 C)  (!) 97.4 F (36.3 C)   TempSrc: Oral  Oral   SpO2: 91% 99% 93% 96%  Weight:      Height:        Intake/Output Summary (Last 24 hours) at 04/23/2018 0910 Last data filed at 04/23/2018 0600 Gross per 24 hour  Intake -  Output 300 ml  Net -300 ml   Filed Weights   04/19/18 1535 04/22/18 2058 04/23/18 0300  Weight: 86.2 kg 88.4 kg 85.1 kg    Examination:  General exam: on BIPAP/ Respiratory system: diminished breath sounds.  Cardiovascular system: S 1, S 2  Gastrointestinal system: BS present, soft, nt Central nervous system: alert, on BIPAP./  Extremities: no edema Skin: No rash      Data Reviewed: I have personally reviewed following labs and imaging studies  CBC: Recent Labs  Lab 04/16/18 1039 04/19/18 0452 04/20/18 0301 04/21/18 0104 04/22/18 0239 04/23/18 0326  WBC 9.0 9.7 9.0 8.6 8.3 7.0  NEUTROABS 6.7  --   --   --   --  4.5  HGB 10.7* 10.8* 10.9* 10.6* 10.7* 10.6*  HCT 37.0 37.2 38.0 37.3 37.7 37.4  MCV 101.1* 102.5* 101.9* 101.9* 101.3* 101.9*  PLT 223 213 207 205 215 232   Basic Metabolic Panel: Recent Labs  Lab 04/19/18 0452 04/20/18 0719 04/21/18 0104 04/22/18 0239 04/23/18 0326  NA 145 144 142 140 140  K 4.7 3.8 3.7 3.9 4.0  CL 96* 91* 85* 86* 89*  CO2 39* 42* 49* 41* 42*  GLUCOSE 108* 111* 112* 105* 90  BUN 29* 24* 26* 29* 33*  CREATININE 1.28*  1.09* 1.35* 1.35* 1.46*  CALCIUM 9.4 9.7 9.9 9.6 9.3   GFR: Estimated Creatinine Clearance: 34.9 mL/min (A) (by C-G formula based on SCr of 1.46 mg/dL (H)). Liver Function Tests: Recent Labs  Lab 04/16/18 1039 04/23/18 0326  AST 15 20  ALT 11 19  ALKPHOS 63 58  BILITOT 0.4 0.6  PROT 6.6 6.6  ALBUMIN 3.1* 3.0*   No results for input(s): LIPASE, AMYLASE in the last 168 hours. No results for input(s): AMMONIA in the last 168 hours. Coagulation Profile: No results for input(s): INR, PROTIME in the last 168 hours. Cardiac Enzymes: Recent Labs  Lab 04/16/18 1039 04/16/18 1551 04/16/18 2020 04/17/18 0238  TROPONINI 0.03* 0.03* <0.03 0.03*   BNP (last 3 results) No results for input(s): PROBNP in the last 8760 hours. HbA1C: No results for input(s): HGBA1C in the last 72 hours. CBG: Recent Labs  Lab 04/22/18 1019  GLUCAP 110*   Lipid  Profile: No results for input(s): CHOL, HDL, LDLCALC, TRIG, CHOLHDL, LDLDIRECT in the last 72 hours. Thyroid Function Tests: Recent Labs    04/20/18 0956  TSH 0.690   Anemia Panel: No results for input(s): VITAMINB12, FOLATE, FERRITIN, TIBC, IRON, RETICCTPCT in the last 72 hours. Sepsis Labs: No results for input(s): PROCALCITON, LATICACIDVEN in the last 168 hours.  Recent Results (from the past 240 hour(s))  Urine Culture     Status: Abnormal   Collection Time: 04/18/18 10:54 AM  Result Value Ref Range Status   Specimen Description URINE, RANDOM  Final   Special Requests   Final    NONE Performed at [facility], [address] [place], [place] [address]    Culture MULTIPLE SPECIES PRESENT, SUGGEST RECOLLECTION (A)  Final   Report Status 04/19/2018 FINAL  Final  MRSA PCR Screening     Status: None   Collection Time: 04/19/18 12:21 PM  Result Value Ref Range Status   MRSA by PCR NEGATIVE NEGATIVE Final    Comment:        The GeneXpert MRSA Assay (FDA approved for NASAL specimens only), is one component of  a comprehensive MRSA colonization surveillance program. It is not intended to diagnose MRSA infection nor to guide or monitor treatment for MRSA infections. Performed at [facility], [address], [place], [place] [address]   Urine Culture     Status: Abnormal   Collection Time: 04/20/18  4:55 PM  Result Value Ref Range Status   Specimen Description URINE, CLEAN CATCH  Final   Special Requests   Final    NONE Performed at [facility], [address] [place], [place] [address]    Culture 20,000 COLONIES/mL ENTEROCOCCUS FAECALIS (A)  Final   Report Status 04/22/2018 FINAL  Final   Organism ID, Bacteria ENTEROCOCCUS FAECALIS (A)  Final      Susceptibility   Enterococcus faecalis - MIC*    AMPICILLIN <=2 SENSITIVE Sensitive     LEVOFLOXACIN 0.5 SENSITIVE Sensitive     NITROFURANTOIN <=16 SENSITIVE Sensitive     VANCOMYCIN 2 SENSITIVE Sensitive     * 20,000 COLONIES/mL ENTEROCOCCUS FAECALIS         Radiology Studies: Dg Chest Port 1v Same Day  Result Date: 04/22/2018 CLINICAL DATA:  CHF EXAM: PORTABLE CHEST 1 VIEW COMPARISON:  04/19/2018 FINDINGS: Mild bilateral interstitial thickening. Bibasilar basilar airspace disease likely reflecting atelectasis and/or scarring. No significant pleural effusion. No pneumothorax. Stable cardiomediastinal silhouette. Prior CABG. IMPRESSION: 1. Mild pulmonary vascular congestion. 2. Bibasilar basilar airspace disease likely reflecting atelectasis and/or scarring. Electronically Signed   By: Elige Ko   On: 04/22/2018 14:35        Scheduled Meds: . acetaminophen  650 mg Oral BID  . acetaZOLAMIDE  500 mg Oral Q12H  . amiodarone  200 mg Oral Daily  . calcium-vitamin D  2 tablet Oral Daily  . clonazePAM  0.5 mg Oral QHS  . diclofenac sodium  2 g Topical QID  . docusate sodium  200 mg Oral Daily  . ferrous gluconate  324 mg Oral Daily  . fluticasone  1 spray Each Nare Daily  . furosemide  80 mg Intravenous BID  .  ipratropium  1 puff Inhalation TID  . isosorbide mononitrate  60 mg Oral Daily  . loratadine  10 mg Oral Daily  . magnesium hydroxide  15 mL Oral Daily  . metoprolol tartrate  25 mg Oral BID  . pantoprazole  40 mg Oral Daily  . potassium chloride SA  40 mEq Oral BID  . Rivaroxaban  15 mg Oral Q supper  . senna  1 tablet Oral Daily  . simvastatin  20 mg Oral q1800  . sodium chloride flush  3 mL Intravenous Q12H  . spironolactone  25 mg Oral Daily   Continuous Infusions: . sodium chloride       LOS: 6 days    Time spent: 35 minutes    Alba Cory, MD [facility] Pager [phone]  If 7PM-7AM, please contact night-coverage [email or web address] Password [email or web address] 04/23/2018, 9:10 AM

## 2018-04-23 NOTE — Progress Notes (Signed)
CRITICAL VALUE ALERT  Critical Value:  ABG-CO2-97  Date & Time Notified:  04/23/2018 04:17AM  Provider Notified: [facility] on call-Bodenheimer, NP  Orders Received/Actions taken: No new orders at this time, RT to place pt on BiPAP.  Caswell Corwin, RN 04/23/18 4:20 AM

## 2018-04-23 NOTE — Progress Notes (Signed)
Pt reported burning when she urinates. Stating she feels "raw" in her groin. Purewick in place, suction was set at . Groin assessed, moisture barrier cream applied, suction for purewick lowered to . Pt states relief with interventions. Will continue to monitor. Caswell Corwin, RN 04/23/18 2:45 AM

## 2018-04-23 NOTE — Progress Notes (Addendum)
Advanced Heart Failure Rounding Note  PCP-Cardiologist: No primary care provider on file.   Subjective:    Underwent successful DCCV 9/25. Maintaining NSR.   Now on diamox 500 BID + lasix 80 mg IV BID. Creatinine up slightly 1.35 > 1.46. Weight down 2 lbs.  ABG this am: pH 7.29, CO2 97, bicarb 46, O2 67. She was then put on BiPAP.  Able to wake up and follow commands.   Objective:   Weight Range: 85.1 kg Body mass index is 30.28 kg/m.   Vital Signs:   Temp:  [97.4 F (36.3 C)-99 F (37.2 C)] 97.4 F (36.3 C) (09/30 0756) Pulse Rate:  [56-69] 57 (09/30 0908) Resp:  [19-32] 26 (09/30 0908) BP: (92-151)/(35-72) 117/54 (09/30 0756) SpO2:  [91 %-100 %] 96 % (09/30 0908) FiO2 (%):  [25 %] 25 % (09/30 0415) Weight:  [85.1 kg-88.4 kg] 85.1 kg (09/30 0300) Last BM Date: 04/21/18  Weight change: Filed Weights   04/19/18 1535 04/22/18 2058 04/23/18 0300  Weight: 86.2 kg 88.4 kg 85.1 kg    Intake/Output:   Intake/Output Summary (Last 24 hours) at 04/23/2018 0910 Last data filed at 04/23/2018 0600 Gross per 24 hour  Intake -  Output 300 ml  Net -300 ml      Physical Exam  General: Elderly. On BiPAP. No resp difficulty. HEENT: Normal Neck: Supple. JVP ~7. Carotids 2+ bilat; no bruits. No thyromegaly or nodule noted. Cor: PMI nondisplaced. RRR, No M/G/R noted Lungs: Diminished in bases anteriorly. On BiPAP. Abdomen: Soft, non-tender, non-distended, no HSM. No bruits or masses. +BS  Extremities: No cyanosis, clubbing, or rash. R and LLE no edema.  Neuro: Alert & orientedx3, cranial nerves grossly intact. moves all 4 extremities w/o difficulty. Affect pleasant  Telemetry   NSR 60s. Personally reviewed.   EKG    No new tracings.  Labs    CBC Recent Labs    04/22/18 0239 04/23/18 0326  WBC 8.3 7.0  NEUTROABS  --  4.5  HGB 10.7* 10.6*  HCT 37.7 37.4  MCV 101.3* 101.9*  PLT 215 232   Basic Metabolic Panel Recent Labs    57/01/77 0239  04/23/18 0326  NA 140 140  K 3.9 4.0  CL 86* 89*  CO2 41* 42*  GLUCOSE 105* 90  BUN 29* 33*  CREATININE 1.35* 1.46*  CALCIUM 9.6 9.3   Liver Function Tests Recent Labs    04/23/18 0326  AST 20  ALT 19  ALKPHOS 58  BILITOT 0.6  PROT 6.6  ALBUMIN 3.0*   No results for input(s): LIPASE, AMYLASE in the last 72 hours. Cardiac Enzymes No results for input(s): CKTOTAL, CKMB, CKMBINDEX, TROPONINI in the last 72 hours.  BNP: BNP (last 3 results) Recent Labs    02/21/18 1717 03/22/18 1514 04/16/18 1039  BNP 252.7* 357.1* 745.9*    ProBNP (last 3 results) No results for input(s): PROBNP in the last 8760 hours.   D-Dimer No results for input(s): DDIMER in the last 72 hours. Hemoglobin A1C No results for input(s): HGBA1C in the last 72 hours. Fasting Lipid Panel No results for input(s): CHOL, HDL, LDLCALC, TRIG, CHOLHDL, LDLDIRECT in the last 72 hours. Thyroid Function Tests Recent Labs    04/20/18 0956  TSH 0.690    Other results:   Imaging    Dg Chest Port 1v Same Day  Result Date: 04/22/2018 CLINICAL DATA:  CHF EXAM: PORTABLE CHEST 1 VIEW COMPARISON:  04/19/2018 FINDINGS: Mild bilateral interstitial thickening. Bibasilar basilar  airspace disease likely reflecting atelectasis and/or scarring. No significant pleural effusion. No pneumothorax. Stable cardiomediastinal silhouette. Prior CABG. IMPRESSION: 1. Mild pulmonary vascular congestion. 2. Bibasilar basilar airspace disease likely reflecting atelectasis and/or scarring. Electronically Signed   By: Elige Ko   On: 04/22/2018 14:35     Medications:     Scheduled Medications: . acetaminophen  650 mg Oral BID  . acetaZOLAMIDE  500 mg Oral Q12H  . amiodarone  200 mg Oral Daily  . calcium-vitamin D  2 tablet Oral Daily  . clonazePAM  0.5 mg Oral QHS  . diclofenac sodium  2 g Topical QID  . docusate sodium  200 mg Oral Daily  . ferrous gluconate  324 mg Oral Daily  . fluticasone  1 spray Each Nare  Daily  . furosemide  80 mg Intravenous BID  . ipratropium  1 puff Inhalation TID  . isosorbide mononitrate  60 mg Oral Daily  . loratadine  10 mg Oral Daily  . magnesium hydroxide  15 mL Oral Daily  . metoprolol tartrate  25 mg Oral BID  . pantoprazole  40 mg Oral Daily  . potassium chloride SA  40 mEq Oral BID  . Rivaroxaban  15 mg Oral Q supper  . senna  1 tablet Oral Daily  . simvastatin  20 mg Oral q1800  . sodium chloride flush  3 mL Intravenous Q12H  . spironolactone  25 mg Oral Daily    Infusions: . sodium chloride      PRN Medications: acetaminophen, benzonatate, bisacodyl, butalbital-acetaminophen-caffeine, nitroGLYCERIN, oxymetazoline, sodium chloride flush    Patient Profile   [name] is a 77 y.o. female with a history of chronic diastolic CHF, moderate pulmonary HTN, HTN, CAD s/p CABG, COPD on home oxygen, and dyslipidemia. Well known to the HF clinic  Admitted 04/16/18 for SOB and c/o nose bleeds. Hgb stable. BNP and volume status elevated.   Assessment/Plan   1. 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 Suspect significant RV failure in setting of moderate mitral stenosis and severe TR. She has been very difficult to manage due to combination of CHF, valvular heart disease and severe COPD. Normal CO on RHC 02/26/18. - Volume status improved.  - Continue diamox 500 mg twice a day.  - Creatinine trending up 1.49. Unable to take POs with BiPAP. She has already received IV lasix this am. Will hold pm dose.  - Continuespironolactone 25 mg daily.  - Consider palliative consult. Having a lot of anxiety, but then gets hypercarbic when taking benzos.  2. CAD:S/P CABG. -She has potential source of angina from 90% stenosis in  distal LCx. The vessel is small at this point and not amenable to PCI. - No s/s ischemia. - Continue Imdur 90 mg daily.  - Continue statin.  - No ASA given Xarelto use.  3. Atrial fibrillation: Paroxysmal. - S/p DCCV 9/25. - Maintaining NSR.  - Continue Xarelto.  - Continue lopressor 25 mg BID - Continue amio 200 mg daily.   4.COPD - PFTs in 2/17 with moderate to severe obstruction.  - Continue chronic O2 2-3 lpm. Currently on BiPAP -Followswith Dr Maple Hudson - pH 7.29, CO2 97, bicarb 46, O2 67. Put on BiPAP. Now following commands.  5. Mitral stenosis: -Rheumatic mitral stenosis, moderate on  7/19 echo. She also has mild to moderate MR.Suspect that the MR makes her a poor candidate for mitral valvuloplasty, and she also would not tolerate anesthesia well with severe COPD. She also would be a poor surgical candidate due to frailty and severe COPD.Suspectour only choice here will be medical management. - No change.  6. Pulmonary HTN:  -This is primarily pulmonary venous hypertension based on last RHC. - Continue diuresis. No change.  7. OSA - Intolerant of CPAP.Encouraged her to find CPAP that works for her. No change.  8. Anemia - hemoglobin stable 10.6.  9. UTI with hematuria - UA +. Has completed ceftriaxone. 10. Lung nodule: Concerning for malignancy, plan for PET as outpatient.No change.  Medication concerns reviewed with patient and pharmacy team. Barriers identified: None at this time.   Length of Stay: 6  Alford Highland, NP  04/23/2018, 9:10 AM  Advanced Heart Failure Team Pager [phone] (M-F; 7a - 4p)  Please contact [facility] for night-coverage after hours (4p -7a ) and weekends on [email or web address]  Patient seen with NP, agree with the above note.   Acute on chronic hypercarbic respiratory failure: She tolerates bipap poorly and has not been able to manage CPAP at home.  She has been on home oxygen chronically.  She has severe anxiety but gets  hypercarbic and lethargic after getting Klonopin (this happened last night).  - Stop benzodiazepines for now.  - Bipap for this morning, try off this afternoon as Klonopin wears off.   Volume status looks improved.  I think she can get 1 more dose of IV Lasix then back to po diuretics tomorrow.  She is on acetazolamide with high HCO3.   I had a discussion with her son this morning about prognosis. She has been in and out of hospital and nursing homes over the last 6 months.  She has poor quality of life.  She does not tolerate Bipap or CPAP well.  She is not an operative candidate for her mitral stenosis.  I think that hospice care would be reasonable in this situation.  Will ask our palliative care service to see her.   Marca Ancona 04/23/2018 10:38 AM

## 2018-04-23 NOTE — Progress Notes (Signed)
Patient placed off bipap and on 2L [place] per ABG results. Patient tolerating well at this time. Will continue to monitor.

## 2018-04-23 NOTE — Progress Notes (Signed)
Palliative Medicine RN Note: Consult order noted. Provider not available today. Rec'd call from RN that patient and family adamantly don't want bipap and patient is symptomatic.  I came to bedside. Spoke with patient, her sister, and her daughter. Patient clearly and adamantly stated that she would rather die than have the bipap again. She reports that she feels like she is suffocating on the bipap. Patient and family verbalize understanding that without the bipap, she may not survive; NOT having bipap is the most important factor.  Discussed symptoms and medications with RN Marcelino Duster, Dr Phillips Odor, patient, and family. Patient and family requesting Xanax despite poor reaction to Klonopin. Mrs Gartley is also refusing the morphine and dilaudid as recommended by Dr Phillips Odor, but she is willing to try fentanyl, as it is short-acting.  Plan in place to remove bipap from the room, start Xanax and Fentanyl, and have PMT provider reach out to family as soon as one is available. Someone from our team will follow up tomorrow.  I expect that patient will need and want hospice services at discharge. Mrs Donithan lives with her son (her HCPOA) and her husband. She would qualify for inpatient hospice due to symptoms if she is willing to scale back some of her other aggressive medications. If she wants to continue these measures, she will likely only qualify for home hospice, but, again, this will be discussed and evaluated by our PMT providers.  Margret Chance Donis Pinder, RN, BSN, [facility] Palliative Medicine Team 04/23/2018 2:44 PM Office [phone]

## 2018-04-23 NOTE — Progress Notes (Signed)
PT Cancellation Note  Patient Details Name: [name] MRN: [id number] DOB: 1940-08-14   Cancelled Treatment:    Reason Eval/Treat Not Completed: (P) Medical issues which prohibited therapy Per chart review, pt with critical CO2 value noted. RN request PT session be deferred until medically appropriate. PT will follow back as schedule allows.  [name] PT, DPT Acute Rehabilitation Services Pager [phone] Office [phone]    [name] 04/23/2018, 8:25 AM

## 2018-04-23 NOTE — Progress Notes (Signed)
OT Cancellation Note  Patient Details Name: [name] MRN: [id number] DOB: April 05, 1941   Cancelled Treatment:     Reason OT Evaluation not complete/Pt not seen: OT orders received and chart reviewed. Per chart review, pt with critical CO2 value noted. RN is in room with pt and states that pt is not medically ready for acute OT assessment at this time. Will check back later as schedule allows as pt is not currently medically ready/able to participate in occupational therapy evaluation.  Charletta Cousin Avacyn Kloosterman Beth Dixon, OTR/L 04/23/2018, 8:19 AM

## 2018-04-23 NOTE — Progress Notes (Signed)
RN assumed care and report from [place], [place] @2330 . Pt resting comfortably on 2LNC. Pt in no acute distress. Will continue to monitor. Caswell Corwin, RN 04/23/18 11:30 PM

## 2018-04-24 DIAGNOSIS — Z515 Encounter for palliative care: Secondary | ICD-10-CM

## 2018-04-24 DIAGNOSIS — Z7189 Other specified counseling: Secondary | ICD-10-CM

## 2018-04-24 LAB — BASIC METABOLIC PANEL
Anion gap: 9 (ref 5–15)
BUN: 37 mg/dL — AB (ref 8–23)
CO2: 41 mmol/L — ABNORMAL HIGH (ref 22–32)
CREATININE: 1.56 mg/dL — AB (ref 0.44–1.00)
Calcium: 9.4 mg/dL (ref 8.9–10.3)
Chloride: 91 mmol/L — ABNORMAL LOW (ref 98–111)
GFR, EST AFRICAN AMERICAN: 36 mL/min — AB (ref >60)
GFR, EST NON AFRICAN AMERICAN: 31 mL/min — AB (ref >60)
Glucose, Bld: 112 mg/dL — ABNORMAL HIGH (ref 70–99)
POTASSIUM: 4.3 mmol/L (ref 3.5–5.1)
SODIUM: 141 mmol/L (ref 135–145)

## 2018-04-24 LAB — CBC
HEMATOCRIT: 39.1 % (ref 36.0–46.0)
Hemoglobin: 10.9 g/dL — ABNORMAL LOW (ref 12.0–15.0)
MCH: 28.8 pg (ref 26.0–34.0)
MCHC: 27.9 g/dL — ABNORMAL LOW (ref 30.0–36.0)
MCV: 103.2 fL — ABNORMAL HIGH (ref 78.0–100.0)
PLATELETS: 244 10*3/uL (ref 150–400)
RBC: 3.79 MIL/uL — AB (ref 3.87–5.11)
RDW: 12.6 % (ref 11.5–15.5)
WBC: 6.3 10*3/uL (ref 4.0–10.5)

## 2018-04-24 MED ORDER — TORSEMIDE 20 MG PO TABS
80.0000 mg | ORAL_TABLET | Freq: Two times a day (BID) | ORAL | Status: DC
  Administered 2018-04-24: 80 mg via ORAL
  Filled 2018-04-24 (×2): qty 4

## 2018-04-24 NOTE — Consult Note (Signed)
Consultation Note Date: 04/24/2018   Patient Name: [name]  DOB: Feb 27, 1941  MRN: [id number]  Age / Sex: 77 y.o., female  PCP: Kelton Pillar, MD Referring Physician: Elmarie Shiley, MD  Reason for Consultation: Establishing goals of care and Hospice Evaluation  HPI/Patient Profile: 77 y.o. female  with past medical history of chronic respiratory failure on 3 L oxygen at home, COPD, thyroidectomy, chronic diastolic heart failure, a fib on xarelto, CAD s/p CABG, CKD3, anxiety, GERD, HTN, HLD, and OA admitted on 04/16/2018 with worsening dyspnea. She was treated  For acute on chronic diastolic heart failure and acute on chronic respiratory failure with IV lasix and bipap. On 9/30 patient adamantly refused bipap stating "I'd rather die" than wear bipap. Patient also has been experiencing anxiety throughout hospitalization. Treated with klonopin but this was discontinued d/t AMS. PMT consulted for [place].  Clinical Assessment and Goals of Care: I have reviewed medical records including EPIC notes, labs and imaging, assessed the patient and then met with patient's son, Legrand Como (he is HCPOA), to discuss diagnosis prognosis, GOC, EOL wishes, disposition and options.  I introduced Palliative Medicine as specialized medical care for people living with serious illness. It focuses on providing relief from the symptoms and stress of a serious illness. The goal is to improve quality of life for both the patient and the family.  As far as functional and nutritional status, Legrand Como tells me of the decline he has seen in [name] recently.  We discussed her current illness and what it means in the larger context of her on-going co-morbidities.  Natural disease trajectory and expectations at EOL were discussed. We specifically discussed her heart failure, respiratory failure, lack of appetite, and lethargy. Legrand Como has good understanding of medical conditions  and also talks about [name] no longer wanting bipap and he is only interested in her comfort.   The difference between aggressive medical intervention and comfort care was considered in light of the patient's goals of care. Legrand Como wants to focus on her comfort and now pursue aggressive medical interventions - no bipap.  Advance directives, concepts specific to code status, artifical feeding and hydration, and rehospitalization were considered and discussed. Confirmed DNR.  Hospice and Palliative Care services outpatient were explained and offered. Family is interested to hospice care at hospice home.  Questions and concerns were addressed. The family was encouraged to call with questions or concerns.   Primary Decision Maker HCPOA - son, Legrand Como Patient is lethargic  SUMMARY OF RECOMMENDATIONS   No more bipap - use medications for dyspnea Patient/family refuses morphine and dilaudid, willing to try fentanyl Xanax for anxiety per patient/family request CSW consult for residential hospice - family requests [place] requests the word "hospice"not be used in front of patient  Code Status/Advance Care Planning:  DNR    Psycho-social/Spiritual:   Desire for further Chaplaincy support:no  Additional Recommendations: Education on Hospice  Prognosis:   < 2 weeks respiratory failure  Discharge Planning: Hospice facility      Primary Diagnoses: Present on Admission: . Acute on chronic diastolic CHF (congestive heart failure) ([place])   I have reviewed the medical record, interviewed the patient and family, and examined the patient. The following aspects are pertinent.  Past Medical History:  Diagnosis Date  . Acute parotitis 01/05/2016  . Anxiety   . Bacteremia, escherichia coli   . Bursitis, shoulder    bilateral  . CHF (congestive heart failure) (HCC)    chronic diasotlic CHF  .  Complication of anesthesia    "have trouble getting me awake sometimes; been ok  latelhy" (09/24/2015)  . COPD (chronic obstructive pulmonary disease) ([place])   . Coronary heart disease 2001   s/p CABG  . Depression   . DJD (degenerative joint disease)   . Dyspnea   . GERD (gastroesophageal reflux disease)   . H/O hiatal hernia   . History of rheumatic heart disease    X 3-LAST TIME WHEN PATIENT WAS 77 YRS OLD  . Hyperlipidemia   . Hypertension   . OA (osteoarthritis)   . On home oxygen therapy    "2L; 24/7" (09/24/2015)  . PUD (peptic ulcer disease)   . Pulmonary HTN (HCC)    PASP 50-56mHg by [id number] . Sleep apnea    intolerant to CPAP  . Thyroid nodule   . Urinary incontinence   . Urine incontinence   . Varicose veins    Social History   Socioeconomic History  . Marital status: Married    Spouse name: Not on file  . Number of children: Not on file  . Years of education: Not on file  . Highest education level: Not on file  Occupational History  . Occupation: retired  SScientific laboratory technician . Financial resource strain: Not on file  . Food insecurity:    Worry: Not on file    Inability: Not on file  . Transportation needs:    Medical: Not on file    Non-medical: Not on file  Tobacco Use  . Smoking status: Former Smoker    Packs/day: 1.00    Years: 47.00    Pack years: 47.00    Types: Cigarettes    Last attempt to quit: 07/26/2007    Years since quitting: 10.7  . Smokeless tobacco: Never Used  Substance and Sexual Activity  . Alcohol use: No  . Drug use: No  . Sexual activity: Not Currently  Lifestyle  . Physical activity:    Days per week: Not on file    Minutes per session: Not on file  . Stress: Not on file  Relationships  . Social connections:    Talks on phone: Not on file    Gets together: Not on file    Attends religious service: Not on file    Active member of club or organization: Not on file    Attends meetings of clubs or organizations: Not on file    Relationship status: Not on file  Other Topics Concern  . Not on file  Social  History Narrative   Husband bilateral left amputee for atheorsclerotic disease   Family History  Problem Relation Age of Onset  . Anemia Mother   . Emphysema Father   . Esophageal cancer Daughter    Scheduled Meds: . acetaminophen  650 mg Oral BID  . acetaZOLAMIDE  500 mg Intravenous Once  . acetaZOLAMIDE  500 mg Oral BID  . amiodarone  200 mg Oral Daily  . calcium-vitamin D  2 tablet Oral Daily  . diclofenac sodium  2 g Topical QID  . docusate sodium  200 mg Oral Daily  . ferrous gluconate  324 mg Oral Daily  . fluticasone  1 spray Each Nare Daily  . ipratropium  0.5 mg Nebulization QID  . isosorbide mononitrate  60 mg Oral Daily  . loratadine  10 mg Oral Daily  . magnesium hydroxide  15 mL Oral Daily  . metoprolol tartrate  25 mg Oral BID  . pantoprazole  40 mg  Oral Daily  . potassium chloride SA  40 mEq Oral BID  . Rivaroxaban  15 mg Oral Q supper  . senna  1 tablet Oral Daily  . simvastatin  20 mg Oral q1800  . sodium chloride flush  3 mL Intravenous Q12H  . spironolactone  25 mg Oral Daily   Continuous Infusions: . sodium chloride     PRN Meds:.acetaminophen, ALPRAZolam, benzonatate, bisacodyl, butalbital-acetaminophen-caffeine, fentaNYL (SUBLIMAZE) injection, nitroGLYCERIN, oxymetazoline, sodium chloride flush Allergies  Allergen Reactions  . Levalbuterol Other (See Comments)    Made mouth and throat sore Made mouth and throat sore  . Lisinopril Cough    Developed persistent dry cough for a month.   . Tape Other (See Comments)    Bleeding  . Cefpodoxime Other (See Comments)    Yeast infections   . Codeine Other (See Comments)    Anxious/ jittery  . Lipitor [Atorvastatin] Other (See Comments)    Made joint start hurting/ Muscle aches  . Other Swelling    Venholin HFA  . Penicillins Other (See Comments)    childhood reaction - pt has tolerated ceftriaxone Has patient had a PCN reaction causing immediate rash, facial/tongue/throat swelling, SOB or  lightheadedness with hypotension: unknown Has patient had a PCN reaction causing severe rash involving mucus membranes or skin necrosis: No Has patient had a PCN reaction that required hospitalization No Has patient had a PCN reaction occurring within the last 10 years: No If all of the above answers are "NO", then may proceed with Cephalosporin use.   Enid Cutter [Kdc:Albuterol] Other (See Comments)    "jittery"  . Celexa [Citalopram Hydrobromide]   . Lyrica [Pregabalin]   . Anoro Ellipta [Umeclidinium-Vilanterol] Itching  . Clindamycin Other (See Comments)    Unknown   . Hydrocodone-Acetaminophen Anxiety  . Latex Itching and Rash   Review of Systems  Unable to perform ROS: Mental status change    Physical Exam  Constitutional: She appears lethargic. She appears ill. No distress.  HENT:  Head: Normocephalic and atraumatic.  Cardiovascular: Normal rate and regular rhythm.  Pulmonary/Chest: Effort normal. She has decreased breath sounds.  Slight acc muscle use, tachypnea  Abdominal: Soft.  Musculoskeletal:       Right lower leg: Normal.       Left lower leg: Normal.  Neurological: She appears lethargic.  Skin: Skin is warm and dry.    Vital Signs: BP (!) 111/42 (BP Location: Right Arm)   Pulse (!) 57   Temp 97.8 F (36.6 C) (Oral)   Resp (!) 39   Ht 5' 6"  (1.676 m)   Wt 85.1 kg   SpO2 100%   BMI 30.28 kg/m  Pain Scale: 0-10 POSS *See Group Information*: S-Acceptable,Sleep, easy to arouse Pain Score: 0-No pain   SpO2: SpO2: 100 % O2 Device:SpO2: 100 % O2 Flow Rate: .O2 Flow Rate (L/min): 2 L/min  IO: Intake/output summary:   Intake/Output Summary (Last 24 hours) at 04/24/2018 0945 Last data filed at 04/24/2018 0600 Gross per 24 hour  Intake 180 ml  Output 600 ml  Net -420 ml    LBM: Last BM Date: 04/24/18 Baseline Weight: Weight: 88.5 kg Most recent weight: Weight: 85.1 kg     Palliative Assessment/Data: PPS 20%   Flowsheet Rows     Most Recent Value    Intake Tab  Referral Department  Cardiology  Unit at Time of Referral  Intermediate Care Unit  Palliative Care Primary Diagnosis  Cardiac  Date Notified  04/23/18  Palliative Care Type  Return patient Palliative Care  Reason for referral  Counsel Regarding Hospice  Date of Admission  04/16/18  Date first seen by Palliative Care  04/23/18  # of days Palliative referral response time  0 Day(s)  # of days IP prior to Palliative referral  7  Clinical Assessment  Psychosocial & Spiritual Assessment  Palliative Care Outcomes      Time Total: 70 minutes Greater than 50%  of this time was spent counseling and coordinating care related to the above assessment and plan.  Juel Burrow, DNP, AGNP-C Palliative Medicine Team [phone] Pager: [phone]

## 2018-04-24 NOTE — Evaluation (Signed)
Occupational Therapy Evaluation Patient Details Name: [name] MRN: [id number] DOB: 10-Jan-1941 Today's Date: 04/24/2018    History of Present Illness Pt is a 77 y.o. female with history h/o chronic diastolic CHF with chronic respiratory failure requiring home O2 2 L to 3 L at baseline, paroxysmal atrial fibrillation on chronic anticoagulation with Xarelto, COPD, anxiety disorder and CAD presents with complaints of progressive dyspnea for the last 1 month.Underwent successful DCCV 9/25. Complicated by hypotension and bradycardia afterward.   Clinical Impression   Pt admitted with above and demonstrates the below listed deficits.  Pt only able to participate very minimally and requested to transfer back to bed.  She required mod A for transfer - spouse very anxious about therapist assisting pt, and insistent on second person to assist, although she completed transfer with +1 assist.  Pt very lethargic throughout session.  Discussed current situation with pt and spouse (pt asleep).   They plan to transition to Hospice services and do not wish her to have therapies at this time ("she can't do that"), and pt does not seem to have the ability to actively participate at this time.  OT will sign off.  Also discussed with PT.       Follow Up Recommendations  No OT follow up;Supervision/Assistance - 24 hour(family expects discharge to facility for Hospice )    Equipment Recommendations  (per family pt will discharge with hospice )    Recommendations for Other Services       Precautions / Restrictions Precautions Precautions: Fall      Mobility Bed Mobility Overal bed mobility: Needs Assistance Bed Mobility: Sit to Supine       Sit to supine: Max assist   General bed mobility comments: assist to lower trunk toward bed and lift LEs onto bed   Transfers Overall transfer level: Needs assistance Equipment used: 1 person hand held assist Transfers: Sit to/from [employer] Sit to Stand: Mod assist Stand pivot transfers: Mod assist;+2 safety/equipment       General transfer comment: verbal cues for hand placement and assist to power up into standing and assist to pivot     Balance Overall balance assessment: Needs assistance Sitting-balance support: Feet supported;Single extremity supported Sitting balance-Leahy Scale: Poor     Standing balance support: Bilateral upper extremity supported;During functional activity Standing balance-Leahy Scale: Poor Standing balance comment: max A                            ADL either performed or assessed with clinical judgement   ADL Overall ADL's : Needs assistance/impaired Eating/Feeding: Maximal assistance Eating/Feeding Details (indicate cue type and reason): pt's sister reports pt apetite is low, and pt has required max A for self feeding  Grooming: Wash/dry hands;Wash/dry face;Moderate assistance;Sitting   Upper Body Bathing: Total assistance;Sitting;Bed level   Lower Body Bathing: Total assistance;Bed level   Upper Body Dressing : Total assistance;Bed level   Lower Body Dressing: Total assistance;Bed level   Toilet Transfer: Moderate assistance;Stand-pivot;BSC;+2 for safety/equipment   Toileting- Clothing Manipulation and Hygiene: Total assistance;Sit to/from stand       Functional mobility during ADLs: Moderate assistance;+2 for safety/equipment General ADL Comments: Pt very lethargic with limited participation      Vision         Perception     Praxis      Pertinent Vitals/Pain Pain Assessment: No/denies pain     Hand Dominance Right  Extremity/Trunk Assessment Upper Extremity Assessment Upper Extremity Assessment: Generalized weakness   Lower Extremity Assessment Lower Extremity Assessment: Defer to PT evaluation       Communication Communication Communication: No difficulties   Cognition Arousal/Alertness: Lethargic Behavior During Therapy: Flat  affect Overall Cognitive Status: Difficult to assess                                 General Comments: due to lethargy.  Pt follows one step commands consistently    General Comments  spouse very anxious re: pt.  He and sister present during session     Exercises     Shoulder Instructions      Home Living Family/patient expects to be discharged to:: Private residence Living Arrangements: Spouse/significant other Available Help at Discharge: Family;Available PRN/intermittently Type of Home: House Home Access: Ramped entrance     Home Layout: One level     Bathroom Shower/Tub: Producer, television/film/video: Standard Bathroom Accessibility: Yes   Home Equipment: Environmental consultant - 4 wheels;Walker - 2 wheels;Hand held shower head;Shower seat;Bedside commode;Wheelchair - manual   Additional Comments: Pt's husband is bilat amputee and utilizes power w/c. Son works as a Music therapist. He lives with them and is there at night (and can come during the day if needed.)       Prior Functioning/Environment Level of Independence: Needs assistance  Gait / Transfers Assistance Needed: ambulates short distances with rollator ADL's / Homemaking Assistance Needed: per sister and spouse, pt has had decreasing level of function over the past several months, and required assist for ADLs             OT Problem List: Decreased strength;Decreased activity tolerance;Impaired balance (sitting and/or standing);Decreased knowledge of use of DME or AE;Cardiopulmonary status limiting activity      OT Treatment/Interventions:      OT Goals(Current goals can be found in the care plan section) Acute Rehab OT Goals Patient Stated Goal: pt did not state.  family wants her comfortable  OT Goal Formulation: All assessment and education complete, DC therapy  OT Frequency:     Barriers to D/C:            Co-evaluation              AM-PAC PT "6 Clicks" Daily Activity     Outcome Measure  Help from another person eating meals?: A Lot Help from another person taking care of personal grooming?: A Lot Help from another person toileting, which includes using toliet, bedpan, or urinal?: Total Help from another person bathing (including washing, rinsing, drying)?: Total Help from another person to put on and taking off regular upper body clothing?: Total Help from another person to put on and taking off regular lower body clothing?: Total 6 Click Score: 8   End of Session Equipment Utilized During Treatment: Oxygen Nurse Communication: Mobility status  Activity Tolerance: Patient limited by lethargy;Patient limited by fatigue Patient left: in bed;with call bell/phone within reach;with family/visitor present  OT Visit Diagnosis: Unsteadiness on feet (R26.81);Muscle weakness (generalized) (M62.81)                Time: 6045-4098 OT Time Calculation (min): 22 min Charges:  OT Evaluation $OT Eval Moderate Complexity: 1 Mod  Jeani Hawking, OTR/L Acute Rehabilitation Services Pager [phone] Office [phone]   Jeani Hawking M 04/24/2018, 12:12 PM

## 2018-04-24 NOTE — Social Work (Signed)
CSW spoke with Larina Bras with PMT. She has met with family and they have stated a preference for [facility]. Request for CSW not to say the word "hospice" around pt. Threasa Beards has contacted Harmon Pier with [employer]. CSW will let Harmon Pier know that this Probation officer is is covering this pt.  [name], [place] Work [phone]

## 2018-04-24 NOTE — Progress Notes (Signed)
PT Cancellation Note  Patient Details Name: [name] MRN: [id number] DOB: October 01, 1940   Cancelled Treatment:    Reason Eval/Treat Not Completed: (P) Other (comment)(Pt family is requesting no further PT) Pt is transitioning to comfort care. PT signing off.   Larone Kliethermes B. Beverely Risen PT, DPT Acute Rehabilitation Services Pager [phone] Office [phone]    Elon Alas Fleet 04/24/2018, 1:47 PM

## 2018-04-24 NOTE — Progress Notes (Addendum)
Advanced Heart Failure Rounding Note  PCP-Cardiologist: No primary care provider on file.   Subjective:    Yesterday Palliative Care consulted. She does not want Bipap again. Started on xanax and fentanyl.  Weight trending down 194>187.   Sleepy this morning. No appetite.   Objective:   Weight Range: 85.1 kg Body mass index is 30.28 kg/m.   Vital Signs:   Temp:  [97.5 F (36.4 C)-97.8 F (36.6 C)] 97.8 F (36.6 C) (10/01 0749) Pulse Rate:  [54-65] 57 (10/01 0749) Resp:  [20-39] 39 (10/01 0749) BP: (91-127)/(34-61) 111/42 (10/01 0749) SpO2:  [95 %-100 %] 100 % (10/01 0749) Last BM Date: 04/21/18  Weight change: Filed Weights   04/19/18 1535 04/22/18 2058 04/23/18 0300  Weight: 86.2 kg 88.4 kg 85.1 kg    Intake/Output:   Intake/Output Summary (Last 24 hours) at 04/24/2018 0830 Last data filed at 04/24/2018 0600 Gross per 24 hour  Intake 180 ml  Output 600 ml  Net -420 ml      Physical Exam  General:  Elderly. Drowsy.  No resp difficulty HEENT: normal Neck: supple. JVP 6-7 Carotids 2+ bilat; no bruits. No lymphadenopathy or thryomegaly appreciated. Cor: PMI nondisplaced. Regular rate & rhythm. No rubs, gallops or murmurs. Lungs: clear Abdomen: soft, nontender, nondistended. No hepatosplenomegaly. No bruits or masses. Good bowel sounds. Extremities: no cyanosis, clubbing, rash, edema Neuro: Drowsy. Alert & orientedx3, cranial nerves grossly intact. moves all 4 extremities w/o difficulty. Affect Flat.     Telemetry  NSR 50-60s   EKG    No new tracings.  Labs    CBC Recent Labs    04/23/18 0326 04/24/18 0302  WBC 7.0 6.3  NEUTROABS 4.5  --   HGB 10.6* 10.9*  HCT 37.4 39.1  MCV 101.9* 103.2*  PLT 232 244   Basic Metabolic Panel Recent Labs    92/95/74 0326 04/24/18 0302  NA 140 141  K 4.0 4.3  CL 89* 91*  CO2 42* 41*  GLUCOSE 90 112*  BUN 33* 37*  CREATININE 1.46* 1.56*  CALCIUM 9.3 9.4   Liver Function Tests Recent Labs   04/23/18 0326  AST 20  ALT 19  ALKPHOS 58  BILITOT 0.6  PROT 6.6  ALBUMIN 3.0*   No results for input(s): LIPASE, AMYLASE in the last 72 hours. Cardiac Enzymes No results for input(s): CKTOTAL, CKMB, CKMBINDEX, TROPONINI in the last 72 hours.  BNP: BNP (last 3 results) Recent Labs    02/21/18 1717 03/22/18 1514 04/16/18 1039  BNP 252.7* 357.1* 745.9*    ProBNP (last 3 results) No results for input(s): PROBNP in the last 8760 hours.   D-Dimer No results for input(s): DDIMER in the last 72 hours. Hemoglobin A1C No results for input(s): HGBA1C in the last 72 hours. Fasting Lipid Panel No results for input(s): CHOL, HDL, LDLCALC, TRIG, CHOLHDL, LDLDIRECT in the last 72 hours. Thyroid Function Tests No results for input(s): TSH, T4TOTAL, T3FREE, THYROIDAB in the last 72 hours.  Invalid input(s): FREET3  Other results:   Imaging    No results found.   Medications:     Scheduled Medications: . acetaminophen  650 mg Oral BID  . acetaZOLAMIDE  500 mg Intravenous Once  . acetaZOLAMIDE  500 mg Oral BID  . amiodarone  200 mg Oral Daily  . calcium-vitamin D  2 tablet Oral Daily  . diclofenac sodium  2 g Topical QID  . docusate sodium  200 mg Oral Daily  . ferrous gluconate  324 mg Oral Daily  . fluticasone  1 spray Each Nare Daily  . ipratropium  0.5 mg Nebulization QID  . isosorbide mononitrate  60 mg Oral Daily  . loratadine  10 mg Oral Daily  . magnesium hydroxide  15 mL Oral Daily  . metoprolol tartrate  25 mg Oral BID  . pantoprazole  40 mg Oral Daily  . potassium chloride SA  40 mEq Oral BID  . Rivaroxaban  15 mg Oral Q supper  . senna  1 tablet Oral Daily  . simvastatin  20 mg Oral q1800  . sodium chloride flush  3 mL Intravenous Q12H  . spironolactone  25 mg Oral Daily    Infusions: . sodium chloride      PRN Medications: acetaminophen, ALPRAZolam, benzonatate, bisacodyl, butalbital-acetaminophen-caffeine, fentaNYL (SUBLIMAZE) injection,  nitroGLYCERIN, oxymetazoline, sodium chloride flush    Patient Profile   REENE HARLACHER is a 77 y.o. female with a history of chronic diastolic CHF, moderate pulmonary HTN, HTN, CAD s/p CABG, COPD on home oxygen, and dyslipidemia. Well known to the HF clinic  Admitted 04/16/18 for SOB and c/o nose bleeds. Hgb stable. BNP and volume status elevated.   Assessment/Plan   1. 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 Suspect significant RV failure in setting of moderate mitral stenosis and severe TR. She has been very difficult to manage due to combination of CHF, valvular heart disease and severe COPD. Normal CO on RHC 02/26/18. - Volume status ok. Would restart home torsemide this evening (no diuretic this morning).   - Continue diamox 500 mg twice a day.  - Creatinine up to 1.56.  - Continuespironolactone 25 mg daily for now. If creatinine worsens may need to stop.  2. CAD:S/P CABG. -She has potential source of angina from 90% stenosis in distal LCx. The vessel is small at this point and not amenable to PCI. - No s/s ischemia.  - Continue Imdur 90 mg daily.  - Continue statin.  - No ASA given Xarelto use.  3. Atrial fibrillation: Paroxysmal. - S/p DCCV 9/25. - Maintaining NSR   - Continue Xarelto.  - Continue lopressor 25 mg BID - Continue amio 200 mg daily.   4.COPD - PFTs in 2/17 with moderate to severe obstruction.  - Continue chronic O2 2-3 lpm.  -Followswith Dr Maple Hudson - Does not want Bipap anymore.   5. 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 She also has mild to moderate MR.Suspect that the MR makes her a poor candidate for mitral valvuloplasty, and she also would not tolerate anesthesia well with  severe COPD. She also would be a poor surgical candidate due to frailty and severe COPD.Our only choice here will be medical management. - No change.  6. Pulmonary HTN:  -This is primarily pulmonary venous hypertension based on last RHC. - Continue diuresis. No change.  7. OSA - Intolerant of CPAP.Encouraged her to find CPAP that works for her. No change.  8. Anemia - hemoglobin stable 10.9  9. UTI with hematuria - UA +. Has completed ceftriaxone. 10. Lung nodule: Concerning for malignancy, plan for PET as outpatient.No change.  I talked to her son today and he was tearful given his Moms condition. He is not sure he can provide 24 hour care. His  father is in a wheel chair and requires assistance. He understands she will need 24 hour care but he  is not sure he can provide that. She would benefit from Hospice services.   Palliative Care following. Family meeting pending.   Length of Stay: 7  Amy Clegg, NP  04/24/2018, 8:30 AM  Advanced Heart Failure Team Pager [phone] (M-F; 7a - 4p)  Please contact [facility] for night-coverage after hours (4p -7a ) and weekends on [email or web address]  Patient seen with NP, agree with the above note.  Patient is currently sleeping after getting Xanax.  She will no longer be getting Bipap/CPAP as she tolerates it very poorly and it creates considerable anxiety.  I talked at length to her sister about option.  I do not think her son can care for her at home, she requires help with all ADLs.  I think our only options will be SNF and hospice facility.  I strongly encouraged inpatient hospice.  Her sister agrees but says that Mrs Graciano is not ready yet.  Will need ongoing discussions.   She has diuresed well, she does not appear significantly volume overloaded at this point.  Creatinine higher at 1.5.   - No diuretic this morning.  - Restart home torsemide 80 mg po bid this afternoon.  - Continue Diamox probably one more day.   She remains in NSR on  amiodarone.   Marca Ancona 04/24/2018 12:07 PM

## 2018-04-24 NOTE — Social Work (Signed)
CSW spoke with [facility]. After conversation with pt family they have expressed that they would preferably like a referral sent to [facility] [place].   CSW spoke with pt son Casimiro Needle on the phone- he agrees with referral to [facility] [place]. Called and made referral to Cherie at [facility] [place], she will reach out to family regarding [facility] [place] services.  Doy Hutching, LCSWA [facility] Clinical Social Work [phone]

## 2018-04-24 NOTE — Progress Notes (Signed)
PROGRESS NOTE    [name]  MWN:[id number] DOB: [birth year] DOA: 04/16/2018 PCP: Kelton Pillar, MD    Brief Narrative: Brief Narrative:  77 year old married female, lives with spouse who is an amputee and wheelchair-bound, patient ambulates with the help of a walker, PMH of chronic diastolic CHF, moderate pulmonary HTN, HTN, HLD, CAD status post CABG, PAF on Xarelto, COPD, chronic hypoxic respiratory failure on home oxygen (2 L at rest and 3 L with activity), anxiety and depression, GERD, negative sleep study 02/22/2016, followed at the HF clinic, last seen in HF clinic 03/22/2018 when given extra dose of torsemide, presented to [facility] ED 04/16/2018 due to progressively worsening dyspnea, significantly decreased effort tolerance over the last month or 2, intermittent chest tightness and intermittent epistaxis.  Admitted for acute on chronic diastolic CHF, acute on chronic hypoxic respiratory failure. HF Cardiology consulted.  Patient was treated with IV lasix, metolazone. She continue to complaining  of dyspnea. She develop respiratory distress and was placed on BIPAP. She didn't tolerated BIPAP well. Patient subsequently became unresponsive , difficult to arouse. Blood gas was consistent with hypercapnic respiratory failure. She was place on BIPAP again. Patient gets very anxious, and required klonopin which makes her more hypercapnia. Benzodiazepine were discontinue. Palliative was consulted for goals of care. Patient decline to be place on BIPAP again, she wasn't treatment for her anxiety. She was started on xanax and fentanyl.   she was transition to oral diuretics. Family met with palliative and plan is to transition to comfort, residential hospice.    Assessment & Plan:   Active Problems:   Chronic respiratory failure with hypercapnia (HCC)   Persistent atrial fibrillation   Acute on chronic diastolic CHF (congestive heart failure) (HCC)   CHF exacerbation (HCC)   Acute on chronic  respiratory failure with hypoxia (HCC)   AF (paroxysmal atrial fibrillation) (HCC)   CKD (chronic kidney disease), stage III (HCC)   Goals of care, counseling/discussion   Palliative care by specialist  1-Acute on chronic diastolic HF; exacerbation;  TTE 01/30/2018: LVEF 60-65%, unable to assess LV diastolic function, RV volume overload, moderate MS severe TR and PA peak pressure 62 mmHg. Right ventricular failure due to Moderate MS and severe TR.  S/P cardioversion 9-25.  Patient more SOB on 9-26 , Increase work of breathing. She will be transfer to step down on BIPAP. Repeated chest x ray, stable changes.  Treated with IV lasix.  weight down 195----187.  Plan to transition to oral diuretic today.  continue with Diamox.  Monitor cr, if increase further discontinue spironolactone.   2-Acute on chronic hypoxic, hypercapnic Respiratory failure;  Component of anxiety. On IV lasix.  Called by nurse at [place] patient difficult to arouse on 9-29. Patient subsequently respond after sternal rub.  Patient was place on BIPAP. -ABG; PCO 2; 87, Po2 76, CCM consulted.  BIPAP removed last night, received klonopin. Repeated blood gas PH 7.2, POC 2 97. Placed on BIPAP.  Patient refuse to be on BIPAP>    UTI; UA with 12-20 WBC, hematuria.  Follow urine culture. Culture 20,000 colonies enterococcus.  Received 4 days of ceftriaxone. Urine culture growing enterococcus. Received a dose of  fosfomycin.   3-COPD;  On ipratropium. Does not tolerates albuterol, xopenex.  Report no headaches today, has use ipratropium.  I spoke with Dr Annamaria Boots, we could try Atrovent hfa. Report headache today, will change to Atrovent HFA. Appreciate pharmacyst help.   4-Headache;  For last week.  ? Worse after ipratropium .  Discussed with pulmonary , could try Spiriva, ellipta. Patient report intolerance to those medications.  CT head negative.  change Atrovent to HFA> to see if that helps with headaches. Monitor response.   Schedule tylenol/    Encephalopathy; metabolic vs toxic.  Suspect delirium from acute illness, UTI> and hypercapnic respiratory failure.  Treated with BIPAP.    Paroxysmal A fib;  DCCV 9-25 Was on amiodarone gtt.  On xarelto.   Epistaxis: Intermittent, likely due to Xarelto and home oxygen use.  Insure humidified oxygen.  If worsens, may need ENT consultation.  CAD status post CABG: Per cardiology, 90% stenosis and distal LCx, not amenable to PCI.  Currently without anginal symptoms.  Troponins minimally elevated in the 0.03 range and flat trend.  Continue Imdur, metoprolol and simvastatin.  No aspirin due to Xarelto  Rheumatic mitral stenosis, moderate: As per cardiology, poor candidate for mitral valvuloplasty or surgery.  Hence medical management.  Stage III CKD; baseline cr 1.3.  Monitor.     DVT prophylaxis: xarelto Code Status: DNR Family Communication: Son  at bedside.  Disposition Plan: residential hospice.   Consultants:   Cardiology    Procedures:   Cardioversion 9-25   Antimicrobials:   none   Subjective: She is sitting in recliner, keep eyes close, able to answer questions.  denies headaches. Complaints of neck pain   Objective: Vitals:   04/24/18 0717 04/24/18 0749 04/24/18 1114 04/24/18 1202  BP:  (!) 111/42  100/60  Pulse:  (!) 57  (!) 59  Resp:  (!) 39  (!) 36  Temp:  97.8 F (36.6 C)  97.9 F (36.6 C)  TempSrc:  Oral  Oral  SpO2: 100% 100% 100% 100%  Weight:      Height:        Intake/Output Summary (Last 24 hours) at 04/24/2018 1451 Last data filed at 04/24/2018 1221 Gross per 24 hour  Intake 180 ml  Output 875 ml  Net -695 ml   Filed Weights   04/19/18 1535 04/22/18 2058 04/23/18 0300  Weight: 86.2 kg 88.4 kg 85.1 kg    Examination:  General exam: keep eyes close. Appears more comfortable today  Respiratory system: Diminished  breath sounds.  Cardiovascular system: S 1, S 2 RRR Gastrointestinal system: BS present,  soft nt Central nervous system: sleepy , answer questions.  Extremities: no edema Skin: no rash      Data Reviewed: I have personally reviewed following labs and imaging studies  CBC: Recent Labs  Lab 04/20/18 0301 04/21/18 0104 04/22/18 0239 04/23/18 0326 04/24/18 0302  WBC 9.0 8.6 8.3 7.0 6.3  NEUTROABS  --   --   --  4.5  --   HGB 10.9* 10.6* 10.7* 10.6* 10.9*  HCT 38.0 37.3 37.7 37.4 39.1  MCV 101.9* 101.9* 101.3* 101.9* 103.2*  PLT 207 205 215 232 509   Basic Metabolic Panel: Recent Labs  Lab 04/20/18 0719 04/21/18 0104 04/22/18 0239 04/23/18 0326 04/24/18 0302  NA 144 142 140 140 141  K 3.8 3.7 3.9 4.0 4.3  CL 91* 85* 86* 89* 91*  CO2 42* 49* 41* 42* 41*  GLUCOSE 111* 112* 105* 90 112*  BUN 24* 26* 29* 33* 37*  CREATININE 1.09* 1.35* 1.35* 1.46* 1.56*  CALCIUM 9.7 9.9 9.6 9.3 9.4   GFR: Estimated Creatinine Clearance: 32.7 mL/min (A) (by C-G formula based on SCr of 1.56 mg/dL (H)). Liver Function Tests: Recent Labs  Lab 04/23/18 0326  AST 20  ALT 19  ALKPHOS 58  BILITOT 0.6  PROT 6.6  ALBUMIN 3.0*   No results for input(s): LIPASE, AMYLASE in the last 168 hours. No results for input(s): AMMONIA in the last 168 hours. Coagulation Profile: No results for input(s): INR, PROTIME in the last 168 hours. Cardiac Enzymes: No results for input(s): CKTOTAL, CKMB, CKMBINDEX, TROPONINI in the last 168 hours. BNP (last 3 results) No results for input(s): PROBNP in the last 8760 hours. HbA1C: No results for input(s): HGBA1C in the last 72 hours. CBG: Recent Labs  Lab 04/22/18 1019  GLUCAP 110*   Lipid Profile: No results for input(s): CHOL, HDL, LDLCALC, TRIG, CHOLHDL, LDLDIRECT in the last 72 hours. Thyroid Function Tests: No results for input(s): TSH, T4TOTAL, FREET4, T3FREE, THYROIDAB in the last 72 hours. Anemia Panel: No results for input(s): VITAMINB12, FOLATE, FERRITIN, TIBC, IRON, RETICCTPCT in the last 72 hours. Sepsis Labs: No results  for input(s): PROCALCITON, LATICACIDVEN in the last 168 hours.  Recent Results (from the past 240 hour(s))  Urine Culture     Status: Abnormal   Collection Time: 04/18/18 10:54 AM  Result Value Ref Range Status   Specimen Description URINE, RANDOM  Final   Special Requests   Final    NONE Performed at [facility], [address], [place], [place] [address]    Culture MULTIPLE SPECIES PRESENT, SUGGEST RECOLLECTION (A)  Final   Report Status 04/19/2018 FINAL  Final  MRSA PCR Screening     Status: None   Collection Time: 04/19/18 12:21 PM  Result Value Ref Range Status   MRSA by PCR NEGATIVE NEGATIVE Final    Comment:        The GeneXpert MRSA Assay (FDA approved for NASAL specimens only), is one component of a comprehensive MRSA colonization surveillance program. It is not intended to diagnose MRSA infection nor to guide or monitor treatment for MRSA infections. Performed at [facility], [place] [address], [place], [place] [address]   Urine Culture     Status: Abnormal   Collection Time: 04/20/18  4:55 PM  Result Value Ref Range Status   Specimen Description URINE, CLEAN CATCH  Final   Special Requests   Final    NONE Performed at [facility], [place] [address] [place], [place] [address]    Culture 20,000 COLONIES/mL ENTEROCOCCUS FAECALIS (A)  Final   Report Status 04/22/2018 FINAL  Final   Organism ID, Bacteria ENTEROCOCCUS FAECALIS (A)  Final      Susceptibility   Enterococcus faecalis - MIC*    AMPICILLIN <=2 SENSITIVE Sensitive     LEVOFLOXACIN 0.5 SENSITIVE Sensitive     NITROFURANTOIN <=16 SENSITIVE Sensitive     VANCOMYCIN 2 SENSITIVE Sensitive     * 20,000 COLONIES/mL ENTEROCOCCUS FAECALIS         Radiology Studies: No results found.      Scheduled Meds: . acetaminophen  650 mg Oral BID  . acetaZOLAMIDE  500 mg Intravenous Once  . acetaZOLAMIDE  500 mg Oral BID  . amiodarone  200 mg Oral Daily  . calcium-vitamin D  2 tablet Oral  Daily  . diclofenac sodium  2 g Topical QID  . docusate sodium  200 mg Oral Daily  . ferrous gluconate  324 mg Oral Daily  . fluticasone  1 spray Each Nare Daily  . ipratropium  0.5 mg Nebulization QID  . isosorbide mononitrate  60 mg Oral Daily  . loratadine  10 mg Oral Daily  .  magnesium hydroxide  15 mL Oral Daily  . metoprolol tartrate  25 mg Oral BID  . pantoprazole  40 mg Oral Daily  . potassium chloride SA  40 mEq Oral BID  . Rivaroxaban  15 mg Oral Q supper  . senna  1 tablet Oral Daily  . simvastatin  20 mg Oral q1800  . sodium chloride flush  3 mL Intravenous Q12H  . spironolactone  25 mg Oral Daily  . torsemide  80 mg Oral BID   Continuous Infusions: . sodium chloride       LOS: 7 days    Time spent: 35 minutes    Elmarie Shiley, MD [facility] Pager [phone]  If 7PM-7AM, please contact night-coverage [email or web address] Password [email or web address] 04/24/2018, 2:51 PM

## 2018-04-25 DIAGNOSIS — Z515 Encounter for palliative care: Secondary | ICD-10-CM

## 2018-04-25 DIAGNOSIS — Z7189 Other specified counseling: Secondary | ICD-10-CM

## 2018-04-25 LAB — CBC
HCT: 38.7 % (ref 36.0–46.0)
HEMOGLOBIN: 11.1 g/dL — AB (ref 12.0–15.0)
MCH: 29 pg (ref 26.0–34.0)
MCHC: 28.7 g/dL — ABNORMAL LOW (ref 30.0–36.0)
MCV: 101 fL — AB (ref 78.0–100.0)
PLATELETS: 260 10*3/uL (ref 150–400)
RBC: 3.83 MIL/uL — AB (ref 3.87–5.11)
RDW: 12.7 % (ref 11.5–15.5)
WBC: 6.9 10*3/uL (ref 4.0–10.5)

## 2018-04-25 LAB — BASIC METABOLIC PANEL
ANION GAP: 5 (ref 5–15)
BUN: 32 mg/dL — ABNORMAL HIGH (ref 8–23)
CHLORIDE: 96 mmol/L — AB (ref 98–111)
CO2: 38 mmol/L — AB (ref 22–32)
Calcium: 9.9 mg/dL (ref 8.9–10.3)
Creatinine, Ser: 1.5 mg/dL — ABNORMAL HIGH (ref 0.44–1.00)
GFR calc non Af Amer: 32 mL/min — ABNORMAL LOW (ref >60)
GFR, EST AFRICAN AMERICAN: 37 mL/min — AB (ref >60)
Glucose, Bld: 101 mg/dL — ABNORMAL HIGH (ref 70–99)
POTASSIUM: 4.1 mmol/L (ref 3.5–5.1)
Sodium: 139 mmol/L (ref 135–145)

## 2018-04-25 LAB — BLOOD GAS, ARTERIAL
ACID-BASE EXCESS: 18.9 mmol/L — AB (ref 0.0–2.0)
Bicarbonate: 46.1 mmol/L — ABNORMAL HIGH (ref 20.0–28.0)
DRAWN BY: 51702
O2 Content: 2 L/min
O2 Saturation: 90.5 %
PCO2 ART: 97.1 mmHg — AB (ref 32.0–48.0)
PH ART: 7.298 — AB (ref 7.350–7.450)
Patient temperature: 98.6
pO2, Arterial: 67 mmHg — ABNORMAL LOW (ref 83.0–108.0)

## 2018-04-25 MED ORDER — ALPRAZOLAM 0.5 MG PO TABS: 0.5000 mg | ORAL_TABLET | ORAL | 0 refills | Status: AC | PRN

## 2018-04-25 MED ORDER — ALPRAZOLAM 0.5 MG PO TABS
0.5000 mg | ORAL_TABLET | ORAL | Status: DC | PRN
  Administered 2018-04-25 (×2): 0.5 mg via ORAL
  Filled 2018-04-25 (×2): qty 1

## 2018-04-25 MED ORDER — SIMVASTATIN 40 MG PO TABS: 60.0000 mg | ORAL_TABLET | Freq: Every day | ORAL | Status: DC

## 2018-04-25 NOTE — Social Work (Addendum)
CSW spoke with [facility], they have spoken with pt family as well as palliative care team. Pt family has signed consents and pt will be admitted to [facility].   Clinical Social Worker facilitated patient discharge including contacting patient family and facility to confirm patient discharge plans.  Clinical information faxed to facility and family agreeable with plan.  CSW arranged ambulance transport via [facility] to [facility] [place] RN will be contacted by [facility] with information regarding report and bed # for pt.  [facility] [place] has requested a 3:30pm pick up.   Clinical Social Worker will sign off for now as social work intervention is no longer needed. Please consult [place] again if new need arises.  Doy Hutching, [place] Clinical Social Worker [phone]

## 2018-04-25 NOTE — Progress Notes (Signed)
Patient ID: JOELEE SNOKE, female   DOB: December 29, 1940, 77 y.o.   MRN: [id number]     Advanced Heart Failure Rounding Note  PCP-Cardiologist: No primary care provider on file.   Subjective:    Palliative Care consulted. She does not want Bipap again. Started on xanax and fentanyl.  She got Xanax earlier this morning and is sleeping soundly.  Stable oxygen saturation.    Objective:   Weight Range: 85.8 kg Body mass index is 30.53 kg/m.   Vital Signs:   Temp:  [97.3 F (36.3 C)-98 F (36.7 C)] 97.5 F (36.4 C) (10/02 0755) Pulse Rate:  [58-61] 58 (10/02 0324) Resp:  [21-36] 21 (10/02 0755) BP: (91-122)/(30-60) 101/39 (10/02 0755) SpO2:  [91 %-100 %] 100 % (10/02 0755) Weight:  [85.1 kg-85.8 kg] 85.8 kg (10/02 0450) Last BM Date: 04/24/18  Weight change: Filed Weights   04/23/18 0300 04/24/18 1300 04/25/18 0450  Weight: 85.1 kg 85.1 kg 85.8 kg    Intake/Output:   Intake/Output Summary (Last 24 hours) at 04/25/2018 0824 Last data filed at 04/25/2018 0315 Gross per 24 hour  Intake -  Output 675 ml  Net -675 ml      Physical Exam   General: NAD Neck: No JVD, no thyromegaly or thyroid nodule.  Lungs: Distant BS bilaterally.  CV: Nondisplaced PMI.  Heart regular S1/S2, no S3/S4, 2/6 HSM LLSB.  No peripheral edema.   Abdomen: Soft, nontender, no hepatosplenomegaly, no distention.  Skin: Intact without lesions or rashes.  Neurologic: Sleeping.   Psych: Normal affect. Extremities: No clubbing or cyanosis.  HEENT: Normal.   Telemetry   NSR 50-60s   EKG    No new tracings.  Labs    CBC Recent Labs    04/23/18 0326 04/24/18 0302 04/25/18 0345  WBC 7.0 6.3 6.9  NEUTROABS 4.5  --   --   HGB 10.6* 10.9* 11.1*  HCT 37.4 39.1 38.7  MCV 101.9* 103.2* 101.0*  PLT 232 244 260   Basic Metabolic Panel Recent Labs    40/98/11 0302 04/25/18 0345  NA 141 139  K 4.3 4.1  CL 91* 96*  CO2 41* 38*  GLUCOSE 112* 101*  BUN 37* 32*  CREATININE 1.56* 1.50*    CALCIUM 9.4 9.9   Liver Function Tests Recent Labs    04/23/18 0326  AST 20  ALT 19  ALKPHOS 58  BILITOT 0.6  PROT 6.6  ALBUMIN 3.0*   No results for input(s): LIPASE, AMYLASE in the last 72 hours. Cardiac Enzymes No results for input(s): CKTOTAL, CKMB, CKMBINDEX, TROPONINI in the last 72 hours.  BNP: BNP (last 3 results) Recent Labs    02/21/18 1717 03/22/18 1514 04/16/18 1039  BNP 252.7* 357.1* 745.9*    ProBNP (last 3 results) No results for input(s): PROBNP in the last 8760 hours.   D-Dimer No results for input(s): DDIMER in the last 72 hours. Hemoglobin A1C No results for input(s): HGBA1C in the last 72 hours. Fasting Lipid Panel No results for input(s): CHOL, HDL, LDLCALC, TRIG, CHOLHDL, LDLDIRECT in the last 72 hours. Thyroid Function Tests No results for input(s): TSH, T4TOTAL, T3FREE, THYROIDAB in the last 72 hours.  Invalid input(s): FREET3  Other results:   Imaging    No results found.   Medications:     Scheduled Medications: . acetaminophen  650 mg Oral BID  . acetaZOLAMIDE  500 mg Intravenous Once  . amiodarone  200 mg Oral Daily  . calcium-vitamin D  2 tablet  Oral Daily  . diclofenac sodium  2 g Topical QID  . docusate sodium  200 mg Oral Daily  . ferrous gluconate  324 mg Oral Daily  . fluticasone  1 spray Each Nare Daily  . ipratropium  0.5 mg Nebulization QID  . isosorbide mononitrate  60 mg Oral Daily  . loratadine  10 mg Oral Daily  . magnesium hydroxide  15 mL Oral Daily  . pantoprazole  40 mg Oral Daily  . potassium chloride SA  40 mEq Oral BID  . Rivaroxaban  15 mg Oral Q supper  . senna  1 tablet Oral Daily  . simvastatin  20 mg Oral q1800  . sodium chloride flush  3 mL Intravenous Q12H  . spironolactone  25 mg Oral Daily  . torsemide  80 mg Oral BID    Infusions: . sodium chloride      PRN Medications: acetaminophen, ALPRAZolam, benzonatate, bisacodyl, butalbital-acetaminophen-caffeine, fentaNYL (SUBLIMAZE)  injection, nitroGLYCERIN, oxymetazoline, sodium chloride flush    Patient Profile   [name] is a 77 y.o. female with a history of chronic diastolic CHF, moderate pulmonary HTN, HTN, CAD s/p CABG, COPD on home oxygen, and dyslipidemia. Well known to the HF clinic  Admitted 04/16/18 for SOB and c/o nose bleeds. Hgb stable. BNP and volume status elevated.   Assessment/Plan   1. Acute on chronic diastolic CHF/valvular heart disease with RV failure: Echo in 7/19 with EF 60-65%, moderate LVH, rheumatic MV with moderate MS (mean gradient 8, MVA 1.58 cm^2), mild-moderate MR, severe TR, PASP 76 mmHg, D-shaped interventricular septum with mildly dilated RV. Suspect significant RV failure in setting of moderate mitral stenosis and severe TR. She has been very difficult to manage due to combination of CHF, valvular heart disease and severe COPD. Normal CO on RHC 02/26/18.Volume status improved with diuretics, now back on po torsemide. Creatinine stable at 1.5.  - Continue torsemide 80 mg bid.    - Will stop Diamox now that we are not aggressively diuresing her.  - Continuespironolactone 25 mg daily for now. If creatinine worsens may need to stop.  2. CAD:S/P CABG: She has potential source of angina from 90% stenosis in distal LCx. The vessel is small at this point and not amenable to PCI.No chest pain. - Continue Imdur 60 mg daily.  - Continue statin.  - No ASA given Xarelto use.  3. Atrial fibrillation: Paroxysmal.S/p DCCV 9/25.  Maintaining NSR   - Continue Xarelto.  - Discontinue metoprolol given mild bradycardia. - Continue amio 200 mg daily.   4.COPD: PFTs in 2/17 with moderate to severe obstruction.  - Continue chronic O2 2-3 lpm.  -Followswith Dr Maple Hudson - She has not tolerated Bipap.  However, family is bringing a Bipap machine from home (her sister's?) for her to try.   5. 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 She also has mild to moderate  MR.Suspect that the MR makes her a poor candidate for mitral valvuloplasty, and she also would not tolerate anesthesia well with severe COPD. She also would be a poor surgical candidate due to frailty and severe COPD.Our only choice here will be medical management. - No change.  6. Pulmonary HTN: This is primarily pulmonary venous hypertension based on last RHC. - Continue diuresis. No change.  7. Anemia: Hgb stable.  8. UTI with hematuria: Has completed ceftriaxone. 9. Lung nodule: Concerning for malignancy, plan for PET as outpatient.No change. 10. Anxiety: Marked.  Had Xanax this morning and is sleeping.  11. Disposition: We had been moving towards residential hospice care, but her son is now questioning this decision.  Will need ongoing discussions and will need to involve palliative care. If she does not go to a residential hospice, I think she will have to go to SNF as she will need full care for all her ADLs.   Marca Ancona 04/25/2018 8:24 AM

## 2018-04-25 NOTE — Discharge Summary (Signed)
Physician Discharge Summary  [name] RUE:[id number] DOB: 10-Nov-1940 DOA: 04/16/2018  PCP: Maurice Small, MD  Admit date: 04/16/2018 Discharge date: 04/25/2018  Admitted From: home Disposition:  Residential hospice   Recommendations for Outpatient Follow-up:  1. Follow up with residential hospice  Home Health: none Equipment/Devices: none  CODE STATUS: DNR Diet recommendation: comfort feeding   HPI: Per Dr. Lajuana Ripple, [name] is a 77 y.o. female with history h/o chronic diastolic CHF with chronic respiratory failure requiring home O2 2 L to 3 L at baseline, paroxysmal atrial fibrillation on chronic anticoagulation with Xarelto, COPD, anxiety disorder and CAD presents with complaints of progressive dyspnea for the last 1 month.  Patient at her best is able to walk with a walker at about 30 feet in her home without feeling short of breath.  Over the last month her exercise tolerance has reduced and over the last 2 days she has had dyspnea at rest as well.  She states she has had trouble falling asleep at night for the longest time and usually is able to rest only for 4 to 5 hours after taking Klonopin.  She did undergo sleep study recently and was ruled out for sleep apnea.  She does report some chest tightness associated with shortness of breath since yesterday.  Work-up in the ED showed BNP at 745 (was 300 before) and chest x-ray with small pleural effusions.  She has received 40 mg of IV Lasix and Solu-Medrol 125 mg in the ED.  She is requested to be admitted for further evaluation and management.  She is currently saturating well at 3L nasal cannula but appears dyspneic while talking full sentences and providing history.  Daughter who is at bedside today reports that patient had 2 episodes of severe epistaxis over the last week, most recently on Saturday when she passed blood clots and daughter was able to help control the bleeding by packing the nose with wet tissue.  Patient  states she has had recurrent episodes of epistaxis on Xarelto over the last 2 years but are usually mild and she does use humidifier at baseline.  She denies any chest pain currently.  She does have some leg swellings but denies any worsening.  She was admitted twice in July for CHF exacerbation  Hospital Course:  Goals of care -Patient admitted to the hospital with hypoxic and hypercarbic respiratory distress due to acute on chronic diastolic CHF, as well as underlying COPD.  She has had progressively worsening disease processes, with 4 hospitalizations in the last few months.  Palliative care was consulted to follow patient while hospitalized, and after discussing with the patient's family there was decided for her care to be transitioned towards comfort and she will be discharged to residential hospice  1-Acute on chronic diastolic HF; exacerbation;  TTE 01/30/2018: LVEF 60-65%, unable to assess LV diastolic function, RV volume overload, moderate MS severe TR and PA peak pressure 62 mmHg. Right ventricular failure due to Moderate MS and severe TR.  S/P cardioversion 9-25.  Patient more SOB on 9-26 , Increase work of breathing. She was be transfer to step down on BIPAP. Heart failure team consulted and followed patient while hospitalized.  She responded to diuresis weight down 195----187.   2-Acute on chronic hypoxic, hypercapnic Respiratory failure;  Component of anxiety. On IV lasix.  Called by nurse at 10;30 patient difficult to arouse on 9-29. Patient subsequently respond after sternal rub.  Main issue here is that she is not  able to tolerate BiPAP due to severe anxiety.  UTI; UA with 12-20 WBC, hematuria.  Follow urine culture. Culture 20,000 colonies enterococcus.  Received 4 days of ceftriaxone. Urine culture growing enterococcus. Received a dose of  fosfomycin.   3-COPD;  On ipratropium. Does not tolerates albuterol, xopenex.   Encephalopathy; metabolic vs toxic.  Suspect  delirium from acute illness, UTI> and hypercapnic respiratory failure.   Paroxysmal A fib;  DCCV 9-25 On xarelto.   Epistaxis: Intermittent, likely due to Xarelto and home oxygen use.  Resolved  CAD status post CABG:Per cardiology, 90% stenosis and distal LCx, not amenable to PCI. Currently without anginal symptoms. Troponins minimally elevated in the 0.03 range and flat trend. Continue Imdur, metoprolol and simvastatin.   Rheumatic mitral stenosis, moderate: As per cardiology, poor candidate for mitral valvuloplasty or surgery. Hence medical management.  Stage III CKD; baseline cr 1.3.  Monitor.   Discharge Diagnoses:  Active Problems:   Chronic respiratory failure with hypercapnia (HCC)   Persistent atrial fibrillation   Acute on chronic diastolic CHF (congestive heart failure) (HCC)   CHF exacerbation (HCC)   Acute on chronic respiratory failure with hypoxia (HCC)   AF (paroxysmal atrial fibrillation) (HCC)   CKD (chronic kidney disease), stage III (HCC)   Goals of care, counseling/discussion   Palliative care by specialist     Discharge Instructions   Allergies as of 04/25/2018      Reactions   Levalbuterol Other (See Comments)   Made mouth and throat sore Made mouth and throat sore   Lisinopril Cough   Developed persistent dry cough for a month.    Tape Other (See Comments)   Bleeding   Cefpodoxime Other (See Comments)   Yeast infections    Codeine Other (See Comments)   Anxious/ jittery   Lipitor [atorvastatin] Other (See Comments)   Made joint start hurting/ Muscle aches   Other Swelling   Venholin HFA   Penicillins Other (See Comments)   childhood reaction - pt has tolerated ceftriaxone Has patient had a PCN reaction causing immediate rash, facial/tongue/throat swelling, SOB or lightheadedness with hypotension: unknown Has patient had a PCN reaction causing severe rash involving mucus membranes or skin necrosis: No Has patient had a PCN reaction  that required hospitalization No Has patient had a PCN reaction occurring within the last 10 years: No If all of the above answers are "NO", then may proceed with Cephalosporin use.   Ventolin [kdc:albuterol] Other (See Comments)   "jittery"   Celexa [citalopram Hydrobromide]    Lyrica [pregabalin]    Anoro Ellipta [umeclidinium-vilanterol] Itching   Clindamycin Other (See Comments)   Unknown    Hydrocodone-acetaminophen Anxiety   Latex Itching, Rash      Medication List    STOP taking these medications   benzonatate 200 MG capsule Commonly known as:  TESSALON   ferrous gluconate 324 MG tablet Commonly known as:  FERGON   Glycopyrrolate-Formoterol 9-4.8 MCG/ACT Aero   loratadine 10 MG tablet Commonly known as:  CLARITIN   simvastatin 20 MG tablet Commonly known as:  ZOCOR     TAKE these medications   acetaminophen 500 MG tablet Commonly known as:  TYLENOL Take 1,000 mg by mouth 2 (two) times daily as needed for headache.   amiodarone 200 MG tablet Commonly known as:  PACERONE Take 1 tablet (200 mg total) by mouth daily.   calcium-vitamin D 500-200 MG-UNIT tablet Commonly known as:  OSCAL WITH D Take 2 tablets by mouth daily.  clonazePAM 0.5 MG tablet Commonly known as:  KLONOPIN Take 1 tablet (0.5 mg total) by mouth at bedtime.   diclofenac sodium 1 % Gel Commonly known as:  VOLTAREN Apply 2 g topically daily as needed (pain).   docusate sodium 100 MG capsule Commonly known as:  COLACE Take 200 mg by mouth as needed for mild constipation.   ipratropium 0.02 % nebulizer solution Commonly known as:  ATROVENT INHALE THE CONTENTS OF 1  VIAL VIA NEBULIZER 4 TIMES  DAILY What changed:  See the new instructions.   isosorbide mononitrate 60 MG 24 hr tablet Commonly known as:  IMDUR Take 60 mg by mouth daily.   metoprolol tartrate 50 MG tablet Commonly known as:  LOPRESSOR Take 1 tablet (50 mg total) by mouth 2 (two) times daily.   nitroGLYCERIN 0.4 MG SL  tablet Commonly known as:  NITROSTAT place 1 tablet under the tongue if needed every 5 minutes for chest pain for 3 doses IF NO RELIEF AFTER 3RD DOSE CALL PRESCRIBER OR 911.   omeprazole 40 MG capsule Commonly known as:  PRILOSEC Take 40 mg by mouth daily.   OXYGEN Inhale 2-3 L into the lungs continuous. At rest 2 liters  Moving around 3 liters   potassium chloride SA 20 MEQ tablet Commonly known as:  K-DUR,KLOR-CON Take 2 tablets (40 mEq total) by mouth 2 (two) times daily.   Rivaroxaban 15 MG Tabs tablet Commonly known as:  XARELTO Take 1 tablet (15 mg total) by mouth daily with supper.   spironolactone 25 MG tablet Commonly known as:  ALDACTONE TAKE 1 TABLET BY MOUTH  DAILY   SYSTANE BALANCE 0.6 % Soln Generic drug:  Propylene Glycol Apply 1 drop to eye as needed (sensitivity).   torsemide 20 MG tablet Commonly known as:  DEMADEX Take 4 tablets (80 mg total) by mouth 2 (two) times daily.        Consultations:  Cardiology   Palliative care  Procedures/Studies:  Dg Chest 2 View  Result Date: 04/16/2018 CLINICAL DATA:  Shortness of breath.  Atrial fibrillation EXAM: CHEST - 2 VIEW COMPARISON:  February 23, 2018 FINDINGS: There is fibrosis in the lung bases, stable. There is a small pleural effusion on the left with left base atelectasis. The lungs elsewhere are clear. Heart is mildly enlarged with pulmonary vascularity normal. Patient is status post coronary artery bypass grafting. There is aortic atherosclerosis. No evident bone lesions. There is anterior wedging of lower thoracic vertebral bodies. IMPRESSION: Small left pleural effusion with left base atelectasis. Underlying bibasilar fibrosis. No frank edema or consolidation. Stable cardiac prominence. Postoperative changes are noted.  There is aortic atherosclerosis. Aortic Atherosclerosis (ICD10-I70.0). Electronically Signed   By: Bretta Bang III M.D.   On: 04/16/2018 10:28   Ct Head Wo Contrast  Result Date:  04/18/2018 CLINICAL DATA:  Headache EXAM: CT HEAD WITHOUT CONTRAST TECHNIQUE: Contiguous axial images were obtained from the base of the skull through the vertex without intravenous contrast. COMPARISON:  10/19/2017 FINDINGS: Brain: No acute intracranial abnormality. Specifically, no hemorrhage, hydrocephalus, mass lesion, acute infarction, or significant intracranial injury. Mild age related volume loss. Vascular: No hyperdense vessel or unexpected calcification. Skull: No acute calvarial abnormality. Sinuses/Orbits: Visualized paranasal sinuses and mastoids clear. Orbital soft tissues unremarkable. Other: None IMPRESSION: No acute intracranial abnormality. Electronically Signed   By: Charlett Nose M.D.   On: 04/18/2018 13:00   Dg Chest Port 1 View  Result Date: 04/19/2018 CLINICAL DATA:  77 year old female with a history  of shortness of breath EXAM: PORTABLE CHEST 1 VIEW COMPARISON:  04/16/2018, 02/23/2018, 02/21/2018 FINDINGS: Cardiomediastinal silhouette unchanged in size and contour. Surgical changes of median sternotomy and CABG. Similar appearance of elevation of the left hemidiaphragm with similar appearance of opacity at the left lung base. Linear opacities bilateral lung bases. No pneumothorax. No displaced fracture. IMPRESSION: Similar appearance of the lungs, with linear and patchy opacities at the lung bases, which may represent a combination of scarring/chronic lung changes and/or consolidation. Surgical changes of median sternotomy and CABG. Electronically Signed   By: Gilmer Mor D.O.   On: 04/19/2018 11:43   Dg Chest Port 1v Same Day  Result Date: 04/22/2018 CLINICAL DATA:  CHF EXAM: PORTABLE CHEST 1 VIEW COMPARISON:  04/19/2018 FINDINGS: Mild bilateral interstitial thickening. Bibasilar basilar airspace disease likely reflecting atelectasis and/or scarring. No significant pleural effusion. No pneumothorax. Stable cardiomediastinal silhouette. Prior CABG. IMPRESSION: 1. Mild pulmonary  vascular congestion. 2. Bibasilar basilar airspace disease likely reflecting atelectasis and/or scarring. Electronically Signed   By: Elige Ko   On: 04/22/2018 14:35      Subjective: Poorly responsive   Discharge Exam: Vitals:   04/25/18 0755 04/25/18 1231  BP: (!) 101/39 117/69  Pulse:    Resp: (!) 21 (!) 28  Temp: (!) 97.5 F (36.4 C) 98.1 F (36.7 C)  SpO2: 100% 100%    General: Pt is poorly responsive  Cardiovascular: RRR, S1/S2 +, no rubs, no gallops Respiratory: CTA bilaterally, shallow breathing  Abdominal: Soft, NT, ND, bowel sounds +   The results of significant diagnostics from this hospitalization (including imaging, microbiology, ancillary and laboratory) are listed below for reference.     Microbiology: Recent Results (from the past 240 hour(s))  Urine Culture     Status: Abnormal   Collection Time: 04/18/18 10:54 AM  Result Value Ref Range Status   Specimen Description URINE, RANDOM  Final   Special Requests   Final    NONE Performed at [facility], [address] [place], [place] [address]    Culture MULTIPLE SPECIES PRESENT, SUGGEST RECOLLECTION (A)  Final   Report Status 04/19/2018 FINAL  Final  MRSA PCR Screening     Status: None   Collection Time: 04/19/18 12:21 PM  Result Value Ref Range Status   MRSA by PCR NEGATIVE NEGATIVE Final    Comment:        The GeneXpert MRSA Assay (FDA approved for NASAL specimens only), is one component of a comprehensive MRSA colonization surveillance program. It is not intended to diagnose MRSA infection nor to guide or monitor treatment for MRSA infections. Performed at [facility], [address] [place], [place] [address]   Urine Culture     Status: Abnormal   Collection Time: 04/20/18  4:55 PM  Result Value Ref Range Status   Specimen Description URINE, CLEAN CATCH  Final   Special Requests   Final    NONE Performed at [facility], [address] [place], [place]  [address]    Culture 20,000 COLONIES/mL ENTEROCOCCUS FAECALIS (A)  Final   Report Status 04/22/2018 FINAL  Final   Organism ID, Bacteria ENTEROCOCCUS FAECALIS (A)  Final      Susceptibility   Enterococcus faecalis - MIC*    AMPICILLIN <=2 SENSITIVE Sensitive     LEVOFLOXACIN 0.5 SENSITIVE Sensitive     NITROFURANTOIN <=16 SENSITIVE Sensitive     VANCOMYCIN 2 SENSITIVE Sensitive     * 20,000 COLONIES/mL ENTEROCOCCUS FAECALIS  Labs: BNP (last 3 results) Recent Labs    02/21/18 1717 03/22/18 1514 04/16/18 1039  BNP 252.7* 357.1* 745.9*   Basic Metabolic Panel: Recent Labs  Lab 04/21/18 0104 04/22/18 0239 04/23/18 0326 04/24/18 0302 04/25/18 0345  NA 142 140 140 141 139  K 3.7 3.9 4.0 4.3 4.1  CL 85* 86* 89* 91* 96*  CO2 49* 41* 42* 41* 38*  GLUCOSE 112* 105* 90 112* 101*  BUN 26* 29* 33* 37* 32*  CREATININE 1.35* 1.35* 1.46* 1.56* 1.50*  CALCIUM 9.9 9.6 9.3 9.4 9.9   Liver Function Tests: Recent Labs  Lab 04/23/18 0326  AST 20  ALT 19  ALKPHOS 58  BILITOT 0.6  PROT 6.6  ALBUMIN 3.0*   No results for input(s): LIPASE, AMYLASE in the last 168 hours. No results for input(s): AMMONIA in the last 168 hours. CBC: Recent Labs  Lab 04/21/18 0104 04/22/18 0239 04/23/18 0326 04/24/18 0302 04/25/18 0345  WBC 8.6 8.3 7.0 6.3 6.9  NEUTROABS  --   --  4.5  --   --   HGB 10.6* 10.7* 10.6* 10.9* 11.1*  HCT 37.3 37.7 37.4 39.1 38.7  MCV 101.9* 101.3* 101.9* 103.2* 101.0*  PLT 205 215 232 244 260   Cardiac Enzymes: No results for input(s): CKTOTAL, CKMB, CKMBINDEX, TROPONINI in the last 168 hours. BNP: Invalid input(s): POCBNP CBG: Recent Labs  Lab 04/22/18 1019  GLUCAP 110*   D-Dimer No results for input(s): DDIMER in the last 72 hours. Hgb A1c No results for input(s): HGBA1C in the last 72 hours. Lipid Profile No results for input(s): CHOL, HDL, LDLCALC, TRIG, CHOLHDL, LDLDIRECT in the last 72 hours. Thyroid function studies No results for input(s):  TSH, T4TOTAL, T3FREE, THYROIDAB in the last 72 hours.  Invalid input(s): FREET3 Anemia work up No results for input(s): VITAMINB12, FOLATE, FERRITIN, TIBC, IRON, RETICCTPCT in the last 72 hours. Urinalysis    Component Value Date/Time   COLORURINE YELLOW 04/18/2018 0926   APPEARANCEUR CLEAR 04/18/2018 0926   LABSPEC 1.009 04/18/2018 0926   PHURINE 6.0 04/18/2018 0926   GLUCOSEU NEGATIVE 04/18/2018 0926   HGBUR LARGE (A) 04/18/2018 0926   BILIRUBINUR NEGATIVE 04/18/2018 0926   KETONESUR NEGATIVE 04/18/2018 0926   PROTEINUR NEGATIVE 04/18/2018 0926   NITRITE NEGATIVE 04/18/2018 0926   LEUKOCYTESUR MODERATE (A) 04/18/2018 0926   Sepsis Labs Invalid input(s): PROCALCITONIN,  WBC,  LACTICIDVEN   Time coordinating discharge: 35 minutes  SIGNED:  Pamella Pert, MD  [facility] 04/25/2018, 2:04 PM Pager [phone]  If 7PM-7AM, please contact night-coverage [email or web address] Password [email or web address]

## 2018-04-25 NOTE — Progress Notes (Signed)
Daily Progress Note   Patient Name: [name]       Date: 04/25/2018 DOB: [birth year]  Age: 77 y.o. MRN#: [id number] Attending Physician: Leatha Gilding, MD Primary Care Physician: Maurice Small, MD Admit Date: 04/16/2018  Reason for Consultation/Follow-up: Establishing goals of care and Hospice Evaluation  Subjective: Patient sleeping. Appears comfortable. Family at bedside.  Length of Stay: 8  Current Medications: Scheduled Meds:  . acetaminophen  650 mg Oral BID  . acetaZOLAMIDE  500 mg Intravenous Once  . amiodarone  200 mg Oral Daily  . calcium-vitamin D  2 tablet Oral Daily  . diclofenac sodium  2 g Topical QID  . docusate sodium  200 mg Oral Daily  . ferrous gluconate  324 mg Oral Daily  . fluticasone  1 spray Each Nare Daily  . ipratropium  0.5 mg Nebulization QID  . isosorbide mononitrate  60 mg Oral Daily  . loratadine  10 mg Oral Daily  . magnesium hydroxide  15 mL Oral Daily  . pantoprazole  40 mg Oral Daily  . potassium chloride SA  40 mEq Oral BID  . Rivaroxaban  15 mg Oral Q supper  . senna  1 tablet Oral Daily  . simvastatin  60 mg Oral q1800  . sodium chloride flush  3 mL Intravenous Q12H  . spironolactone  25 mg Oral Daily  . torsemide  80 mg Oral BID    Continuous Infusions: . sodium chloride      PRN Meds: acetaminophen, ALPRAZolam, benzonatate, bisacodyl, butalbital-acetaminophen-caffeine, fentaNYL (SUBLIMAZE) injection, nitroGLYCERIN, oxymetazoline, sodium chloride flush  Physical Exam         Constitutional: She appears lethargic. She appears ill. No distress.  HENT:  Head: Normocephalic and atraumatic.  Cardiovascular: Normal rate and regular rhythm.  Pulmonary/Chest: Effort normal. She has decreased breath sounds.  Abdominal: Soft.    Musculoskeletal:       Right lower leg: Normal.       Left lower leg: Normal.  Neurological: She appears lethargic.  Skin: Skin is warm and dry.   Vital Signs: BP (!) 101/39 (BP Location: Right Arm)   Pulse (!) 58   Temp (!) 97.5 F (36.4 C) (Oral)   Resp (!) 21   Ht 5\' 6"  (1.676 m)   Wt 85.8 kg   SpO2 100%   BMI 30.53 kg/m  SpO2: SpO2: 100 % O2 Device: O2 Device: Nasal Cannula O2 Flow Rate: O2 Flow Rate (L/min): 2 L/min  Intake/output summary:   Intake/Output Summary (Last 24 hours) at 04/25/2018 0849 Last data filed at 04/25/2018 0315 Gross per 24 hour  Intake -  Output 675 ml  Net -675 ml   LBM: Last BM Date: 04/24/18 Baseline Weight: Weight: 88.5 kg Most recent weight: Weight: 85.8 kg       Palliative Assessment/Data: PPS 20%    Flowsheet Rows     Most Recent Value  Intake Tab  Referral Department  Cardiology  Unit at Time of Referral  Intermediate Care Unit  Palliative Care Primary Diagnosis  Cardiac  Date Notified  04/23/18  Palliative Care Type  Return patient Palliative Care  Reason for referral  Counsel Regarding Hospice  Date of Admission  04/16/18  Date first seen by Palliative Care  04/23/18  # of days Palliative referral response time  0 Day(s)  # of days IP prior to Palliative referral  7  Clinical Assessment  Palliative Performance Scale Score  20%  Psychosocial & Spiritual Assessment  Palliative Care Outcomes  Patient/Family meeting held?  Yes  Who was at the meeting?  son  Palliative Care Outcomes  Clarified goals of care, Improved non-pain symptom therapy, Improved pain interventions, Counseled regarding hospice, Provided end of life care assistance, Provided advance care planning, Provided psychosocial or spiritual support, Changed to focus on comfort, Transitioned to hospice      Patient Active Problem List   Diagnosis Date Noted  . Goals of care, counseling/discussion   . Palliative care by specialist   . Acute on chronic  respiratory failure with hypoxia (HCC)   . AF (paroxysmal atrial fibrillation) (HCC)   . CKD (chronic kidney disease), stage III (HCC)   . CHF exacerbation (HCC) 04/16/2018  . Normocytic anemia 01/25/2018  . Metabolic alkalosis 12/13/2017  . Gait disturbance 11/21/2017  . Lung nodule 11/07/2016  . Acute on chronic diastolic CHF (congestive heart failure) (HCC)   . Mitral valve stenosis   . Rheumatic aortic valve disease   . Persistent atrial fibrillation 06/12/2016  . Chronic respiratory failure with hypoxia (HCC) 04/20/2016  . Rheumatic mitral valve disease   . PFO (patent foramen ovale)   . Cardiomyopathy (HCC)   . Chronic atrial fibrillation   . Alcoholic cardiomyopathy (HCC)   . Aortic valve vegetation 09/24/2015  . Lumbar spine scoliosis 04/18/2015  . Mitral stenosis 02/10/2015  . Chronic diastolic heart failure (HCC) 05/10/2013  . Chronic anticoagulation 05/10/2013  . HTN (hypertension) 05/10/2013  . Dyslipidemia, goal LDL below 70 05/10/2013  . Seasonal allergic rhinitis 04/11/2012  . Chronic respiratory failure with hypercapnia (HCC) 11/02/2011  . DYSPNEA 03/14/2008  . Coronary atherosclerosis 02/27/2008  . COPD mixed type (HCC) 02/27/2008  . Pulmonary hypertension (HCC) 02/21/2008    Palliative Care Assessment & Plan   HPI: 77 y.o. female  with past medical history of chronic respiratory failure on 3 L oxygen at home, COPD, thyroidectomy, chronic diastolic heart failure, a fib on xarelto, CAD s/p CABG, CKD3, anxiety, GERD, HTN, HLD, and OA admitted on 04/16/2018 with worsening dyspnea. She was treated  For acute on chronic diastolic heart failure and acute on chronic respiratory failure with IV lasix and bipap. On 9/30 patient adamantly refused bipap stating "I'd rather die" than wear bipap. Patient also has been experiencing anxiety throughout hospitalization. Treated with klonopin but this was discontinued d/t AMS. PMT consulted for GOC.  Assessment: F/u visit with  family and patient. Yesterday, we had decided that patient would transfer to residential hospice. However, this morning the patient's son expressed concerns about this decision.  Today we discussed our options again. Casimiro Needle tells me "I know that is what is best", but expresses fears about her life ending and worried he is not making the right decision. We talked through options that were available and what he felt would honor his mom's wishes. Casimiro Needle was also able to talk to his sisters and father - he reports they all feel that a transition to residential hospice would be most appropriate now. After discussion, Casimiro Needle tells me he would like to proceed with [facility] to [facility]. I notified RN, MD, and hospice liaison of [name]'s decision.   Pt currently resting comfortably. Xanax was decreased this AM by  attending MD. Family is fearful for morphine and dilaudid, but agreed to fentanyl. This has been ordered for dyspnea or pain.   Questions and concerns were addressed. The family was encouraged to call with questions or concerns.   Recommendations/Plan: No more bipap - use medications for dyspnea Patient/family refuses morphine and dilaudid, willing to try fentanyl Xanax for anxiety per patient/family request CSW consult for residential hospice - family requests [facility] Family requests the word "hospice"not be used in front of patient  Code Status:  DNR  Prognosis:   < 2 weeks d/t respiratory failure  Discharge Planning:  Hospice facility  Care plan was discussed with RN, MD, son, [name]/HCPOA, hospice liaison  Thank you for allowing the Palliative Medicine Team to assist in the care of this patient.   Total Time 40 minutes Prolonged Time Billed  no       Greater than 50%  of this time was spent counseling and coordinating care related to the above assessment and plan.  Gerlean Ren, DNP, [facility] Palliative Medicine Team Team Phone #  [phone]  Pager [phone]

## 2018-05-02 ENCOUNTER — Encounter (HOSPITAL_COMMUNITY): Payer: Medicare Other

## 2018-05-25 DEATH — deceased

## 2018-06-18 ENCOUNTER — Ambulatory Visit: Payer: Medicare Other | Admitting: Internal Medicine

## 2020-03-15 IMAGING — DX DG CHEST 1V PORT
1 series · 1 of 1 positions shown · non-contrast
Comparison: 12/13/2017

CLINICAL DATA: Exacerbation of COPD.  Shortness of breath.

EXAM:
PORTABLE CHEST 1 VIEW

[chest ap]
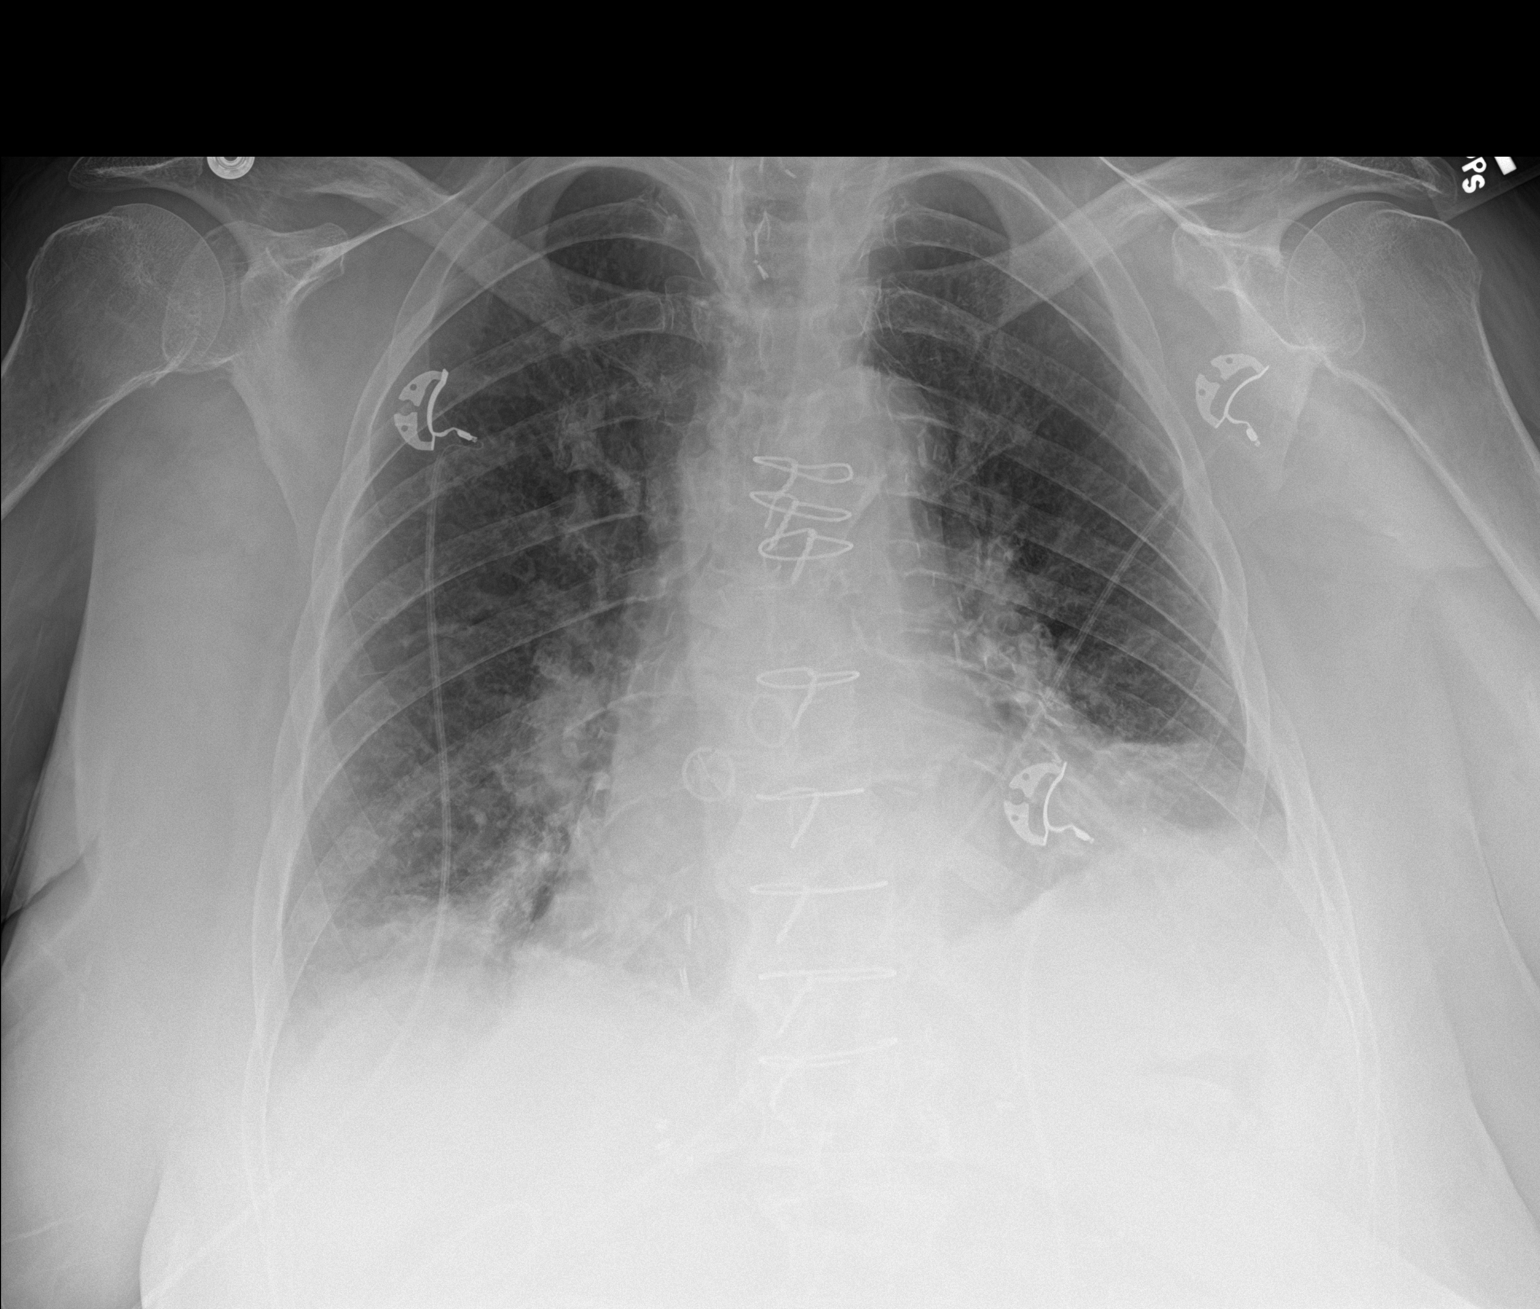

[1 of 1 positions shown; findings below may reference images not displayed]

FINDINGS: Previous median sternotomy and CABG. Basilar lung markings are
worsened, particularly on the right, consistent with basilar
pneumonia. Upper lungs remain clear. No visible effusions. No acute
bone finding. Previous thyroid surgery..
IMPRESSION: Worsened basilar lung markings figure limb on the right most
consistent with basilar pneumonia.

## 2020-04-21 IMAGING — CR DG CHEST 2V
2 series · 2 of 2 positions shown · non-contrast
Comparison: 01/12/2018

CLINICAL DATA: 77-year-old female with a history of increased
shortness of breath

EXAM:
CHEST - 2 VIEW

[chest lat]
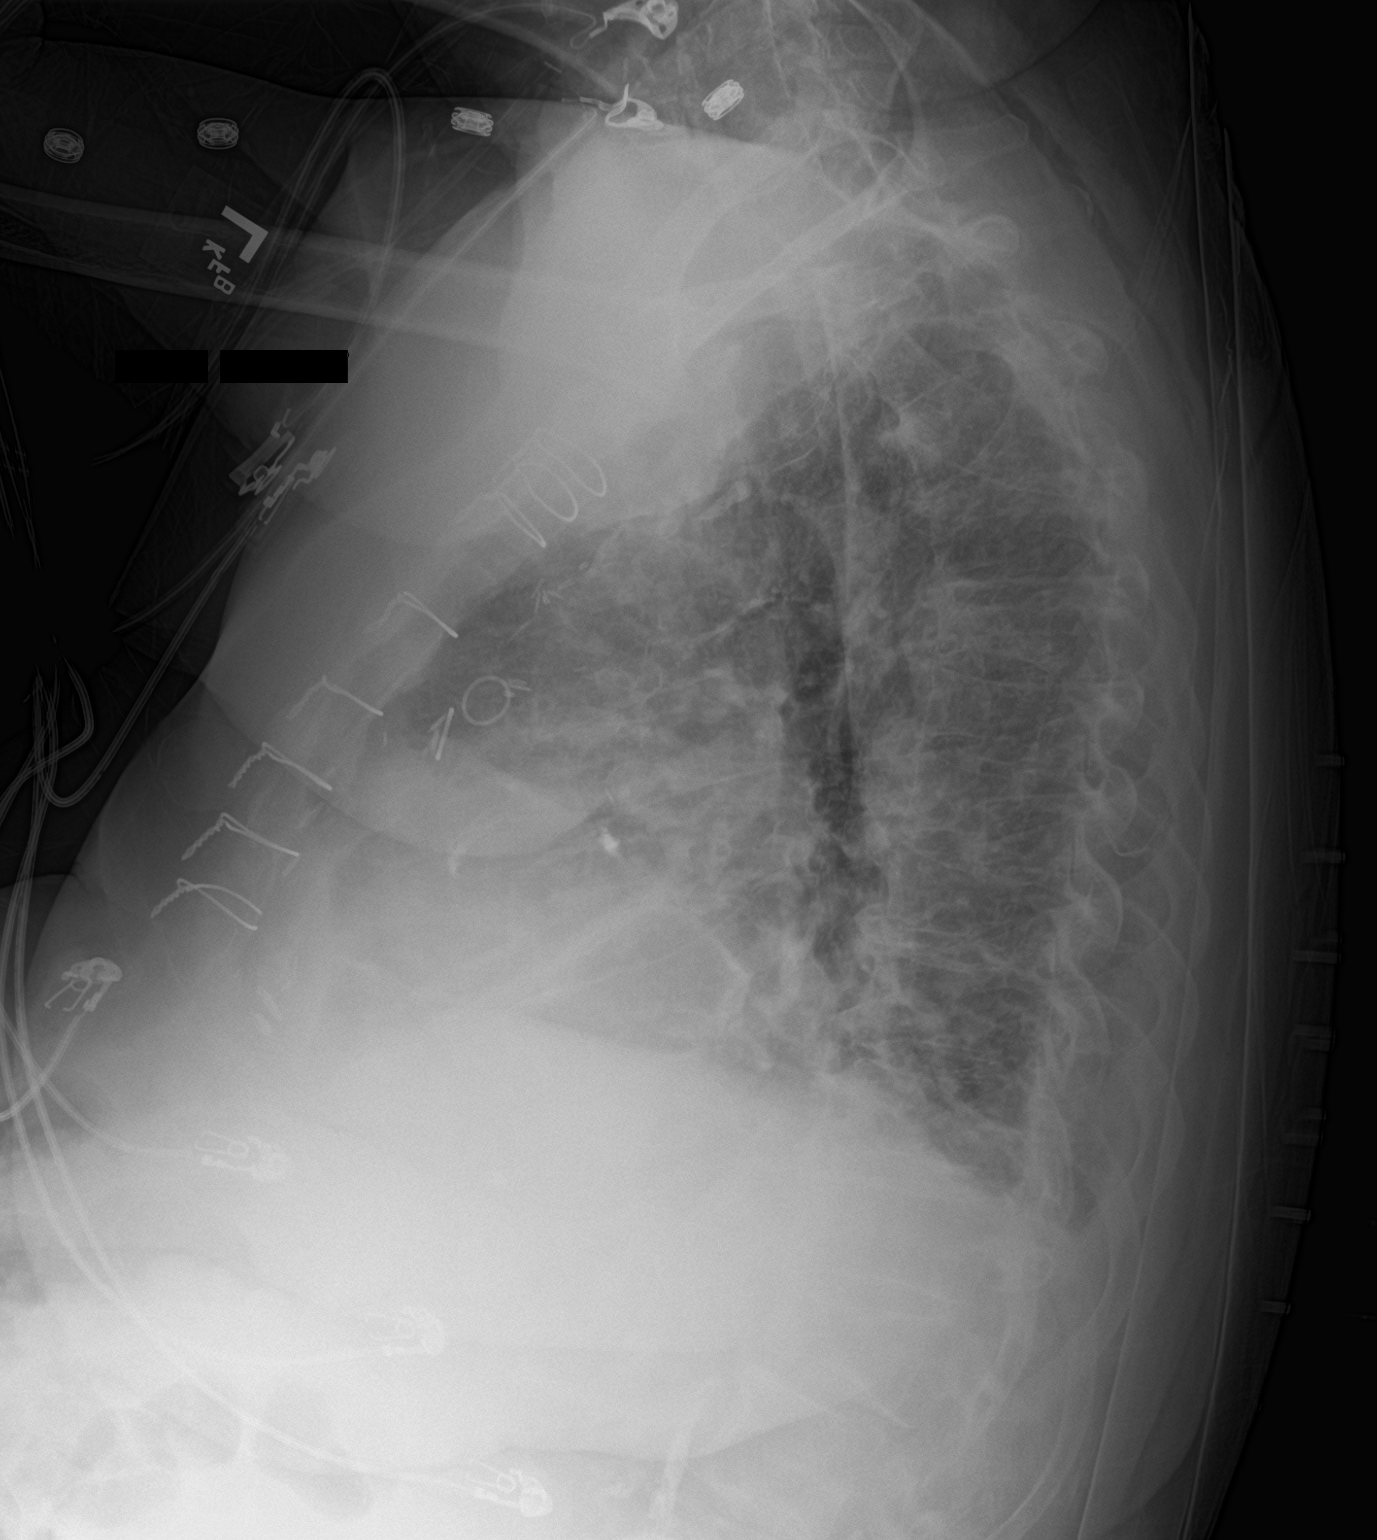

[chest ap]
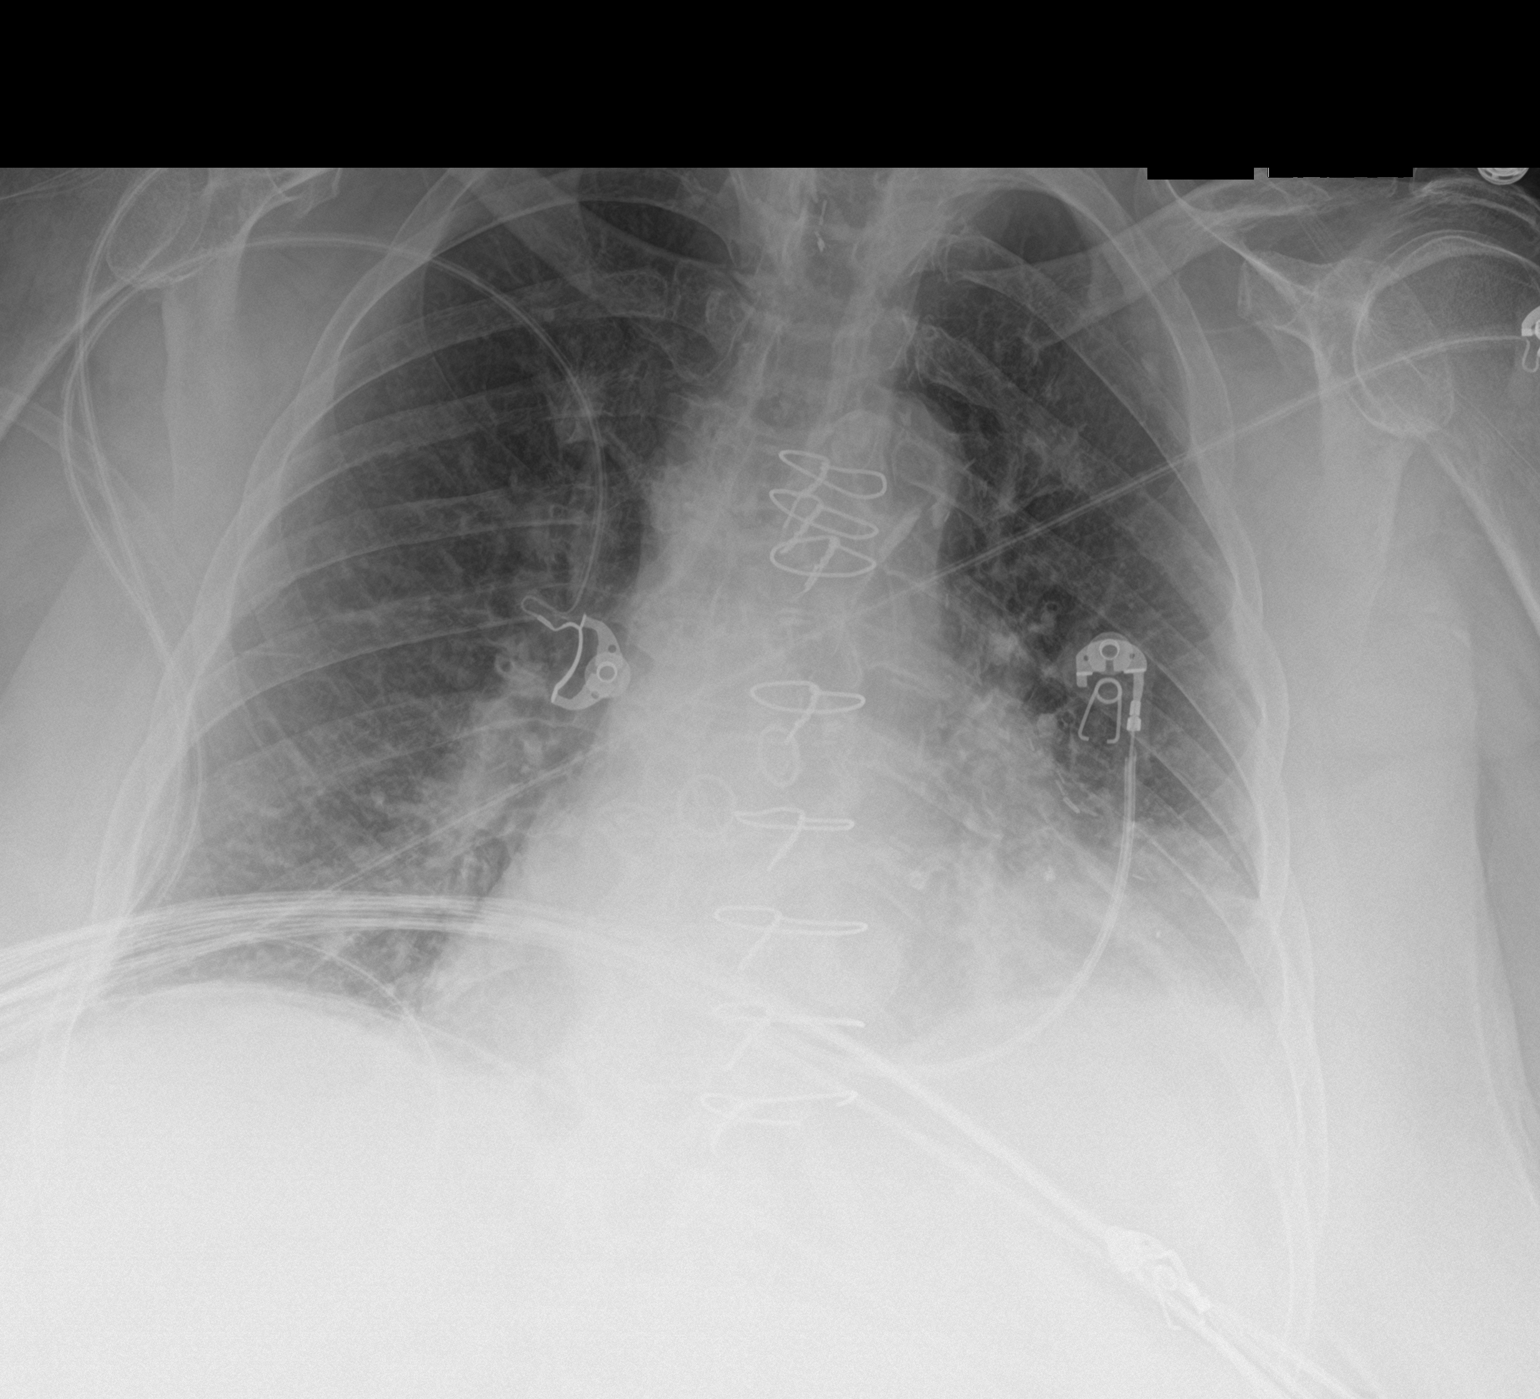

[2 of 2 positions shown; findings below may reference images not displayed]

FINDINGS: Cardiomediastinal silhouette is unchanged in size and contour.
Surgical changes of median sternotomy and CABG.

Low lung volumes with persisting mixed interstitial and airspace
opacity at the bilateral lung bases. Blunting of the costophrenic
sulcus on the lateral view.

No pneumothorax.

Degenerative changes of the spine.
IMPRESSION: Low lung volumes with mixed interstitial and airspace opacity at the
lung bases, may reflect a combination of edema,
atelectasis/consolidation, and scarring. Correlation with lab values
may be useful.

Trace left-sided pleural effusion not excluded.

Surgical changes of median sternotomy and CABG.

## 2020-04-26 IMAGING — CT CT CHEST W/ CM
2 of 3 series · 14 of 36 positions shown, 17 images · IV contrast (APPLIED)
Comparison: CT chest 11/01/2017, 01/23/2017. Chest x-rays
01/25/2018 and earlier.

CLINICAL DATA: 77-year-old with recent progressive shortness of
breath associated with BILATERAL LOWER extremity edema and weight
gain. Current history of pulmonary hypertension, COPD with chronic
hypoxic respiratory failure, hypertension, diastolic heart failure
and coronary artery disease with prior CABG.

EXAM:
CT CHEST WITH CONTRAST
TECHNIQUE: Multidetector CT imaging of the chest was performed during
intravenous contrast administration.
CONTRAST:  100mL OMNIPAQUE IOHEXOL 300 MG/ML IV.

[Series 3: thorax 2.0 i31f 2 · axial · 0.79mm/px · z∈[+961,+1217]mm · 11 of 152 slices shown, 14 images]
[im 12/152  mediastinal]
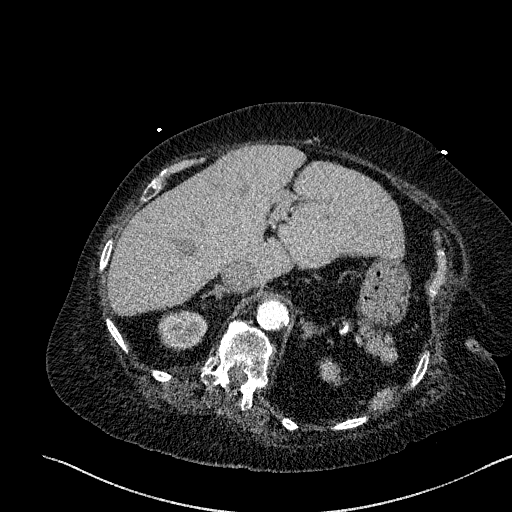
[im 12/152  lung]
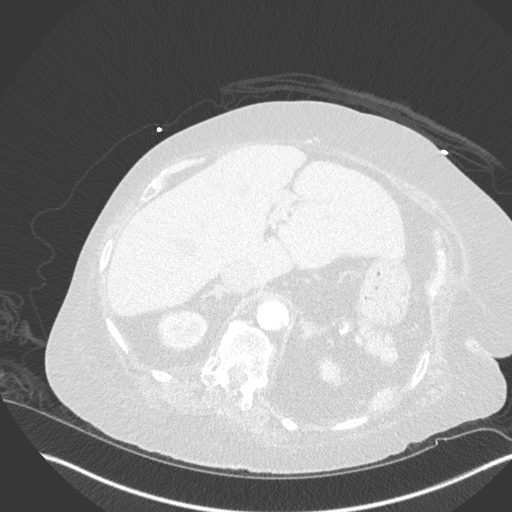
[im 23/152  lung]
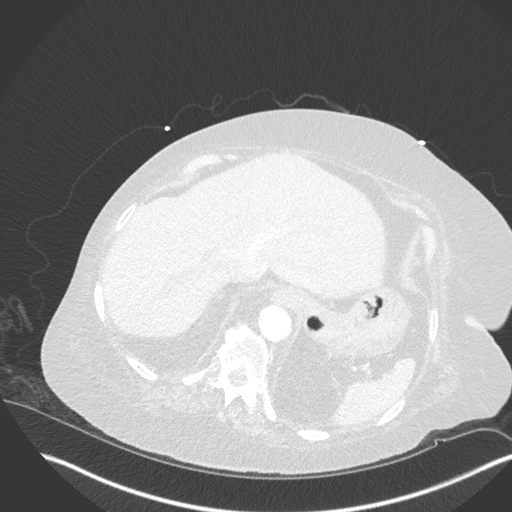
[im 34/152  lung]
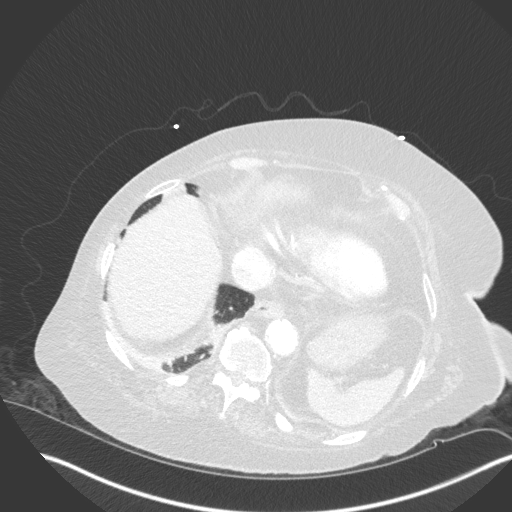
[im 51/152  lung]
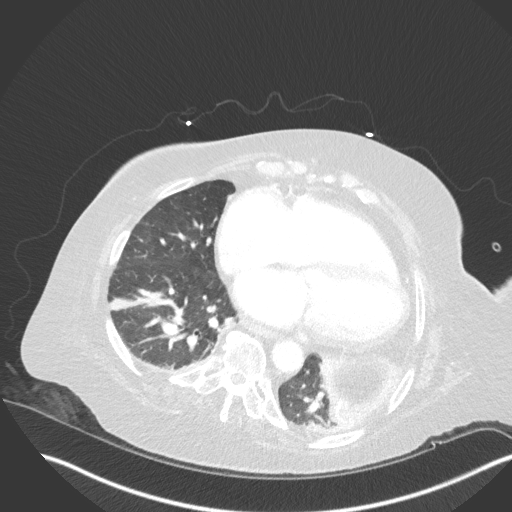
[im 62/152  mediastinal]
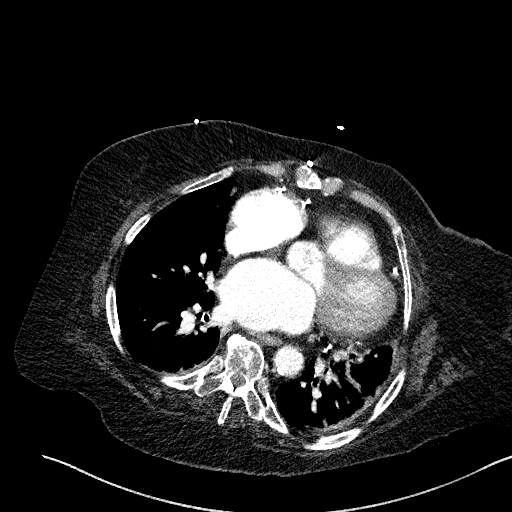
[im 62/152  lung]
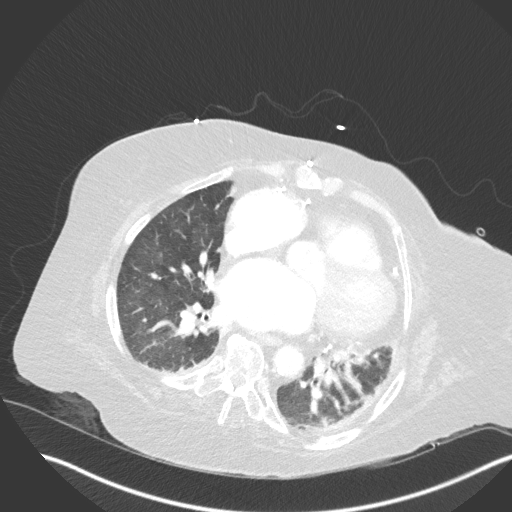
[im 79/152  lung]
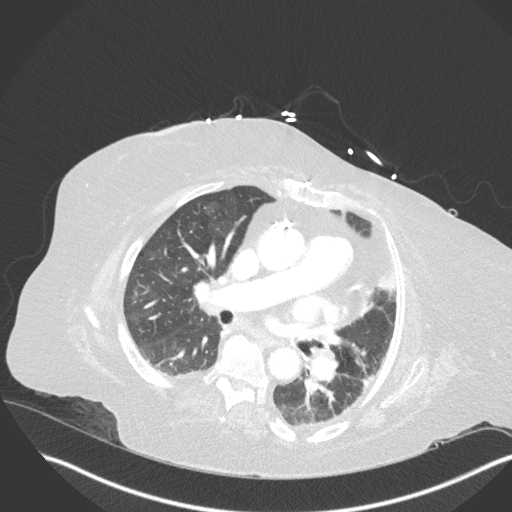
[im 90/152  lung]
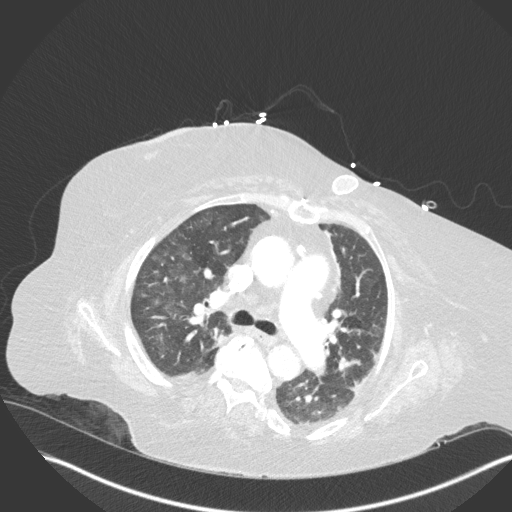
[im 101/152  lung]
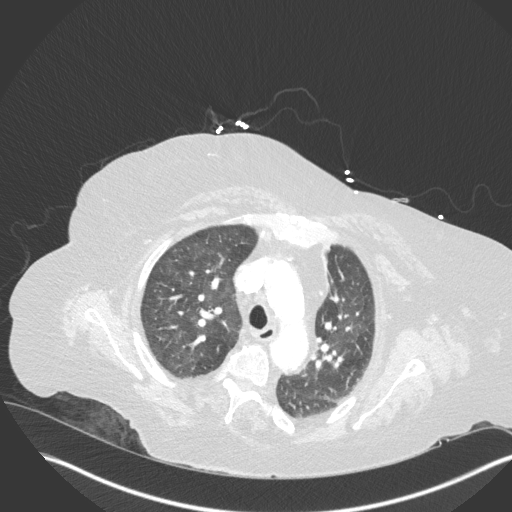
[im 118/152  mediastinal]
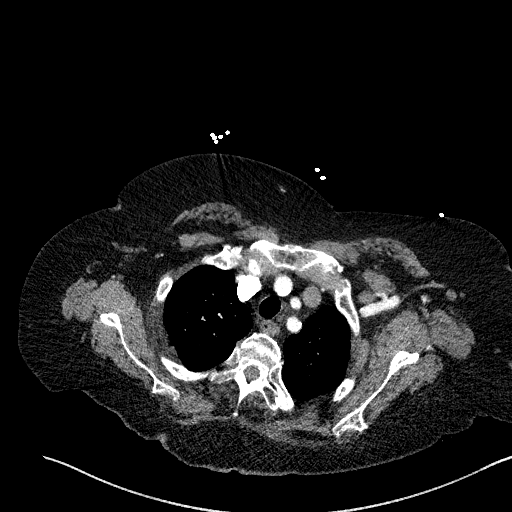
[im 118/152  lung]
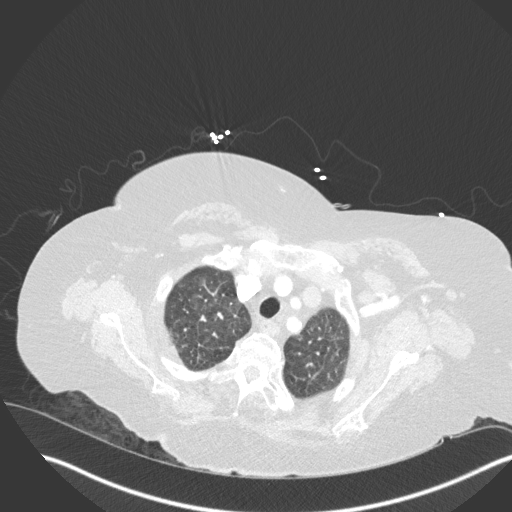
[im 129/152  lung]
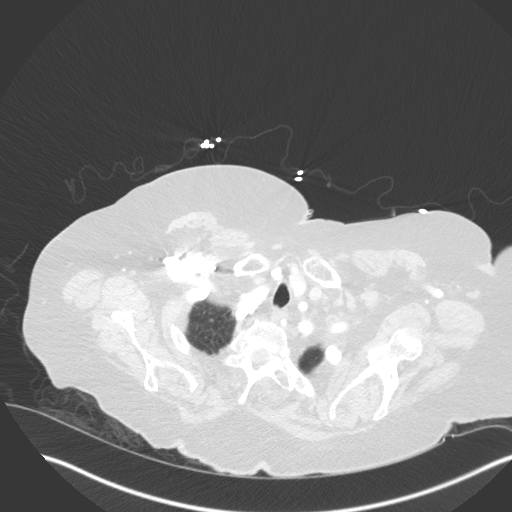
[im 140/152  lung]
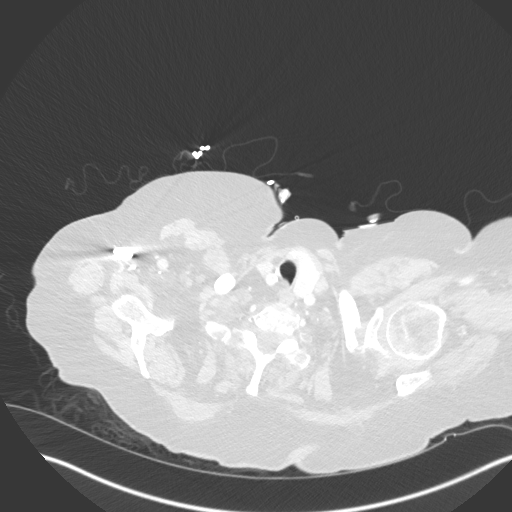

[Series 6: coronal · coronal · 0.59mm/px · 3 of 151 slices shown]
[im 31/151  lung]
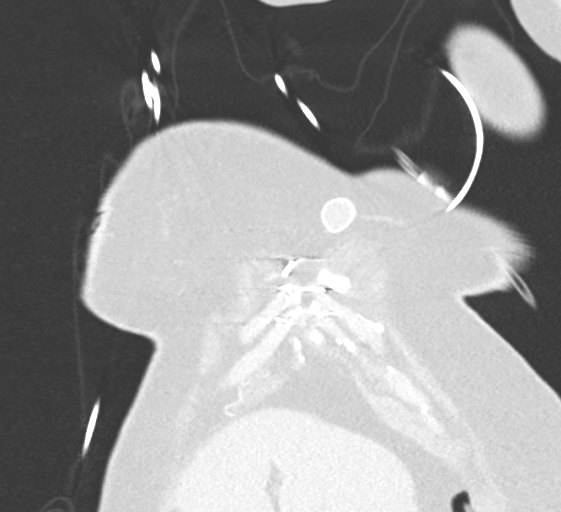
[im 61/151  lung]
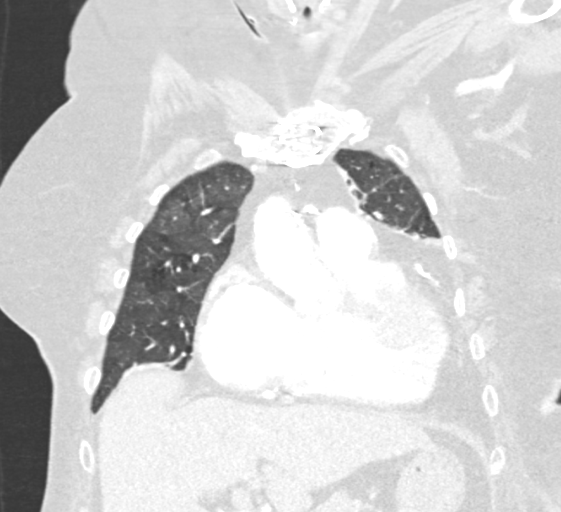
[im 91/151  lung]
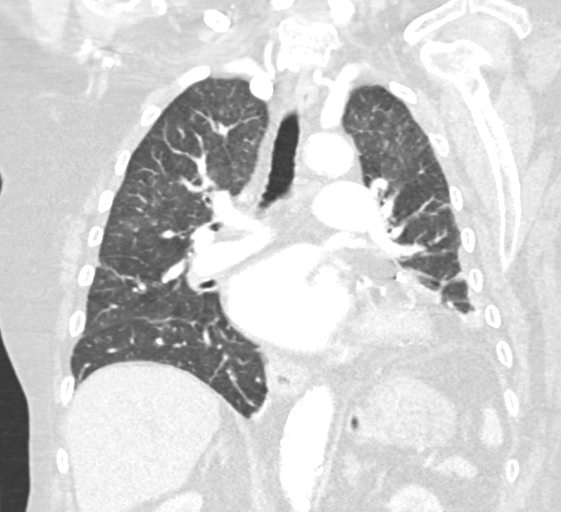

[14 of 36 positions shown; findings below may reference images not displayed]

FINDINGS: Cardiovascular: Heart markedly enlarged with biatrial enlargement in
particular. Prior CABG. The LAD, RIGHT coronary and [REDACTED] grafts
appear patent, though there is dystrophic calcification involving
the proximal LAD graft. No pericardial effusion.

Severe atherosclerosis involving the thoracic and proximal abdominal
aorta without evidence of aneurysm, maximum ascending thoracic
aortic diameter 3.7 cm. Atherosclerosis involving the proximal great
vessels with borderline stenosis of the origin of the LEFT common
carotid artery.

Central pulmonary arteries patent. Calcification involving the wall
of the LEFT lower lobe pulmonary artery is again noted.

Mediastinum/Nodes: Numerous normal-sized lymph nodes throughout the
mediastinum and in both hila. No pathologically enlarged
mediastinal, hilar or axillary lymph nodes. No mediastinal masses.
Normal-appearing esophagus. Prior RIGHT hemithyroidectomy. Multiple
small nodules involving the residual LEFT lobe, unchanged, without
evidence of a dominant nodule.

Lungs/Pleura: The previously identified ground-glass nodule
involving the LEFT lung apex currently has a more solid appearance
currently, measuring approximately 1.1 x 1.0 cm, not significantly
changed in size since the 01/23/2017 CT where it measured
approximately 1.0 x 0.9 cm. The pleural based 4 mm nodule in the
POSTERIOR RIGHT apex is unchanged.

Diffuse interstitial pulmonary edema is present. Linear atelectasis
is present in the RIGHT lower lobe. Atelectasis superimposed upon
scarring is present in the LEFT lower lobe. No pleural effusions.
Central airways patent without significant bronchial wall
thickening.

Upper Abdomen: Enlargement of the LEFT lobe of the liver relative to
the RIGHT lobe, with associated enlargement of the caudate lobe. No
focal parenchymal abnormality within the visualized liver.
Visualized upper abdomen otherwise unremarkable.

Musculoskeletal: Degenerative disc disease and spondylosis
throughout the thoracic spine. Mild compression fracture of the
UPPER endplate of T12 and the LOWER endplate of T5 which are
unchanged. No acute osseous findings.
IMPRESSION: 1. Mild CHF, with evidence of mild diffuse interstitial pulmonary
edema.
2. BILATERAL lower lobe atelectasis.
3. LEFT apical lung nodule, previously ground-glass, now has a more
solid appearance, though is not significantly changed in size since
the CT in January 2017. Adenocarcinoma is considered. Thoracic surgery
consultation is recommended. PET/CT should be considered for staging
purposes. These recommendations are taken from:
Recommendations for the Management of Subsolid Pulmonary Nodules
Detected at CT: A Statement from the [HOSPITAL]

4. Calcification involving the wall of the LEFT lower lobe pulmonary
artery indicates pulmonary arterial hypertension.
5. Hepatic cirrhosis is suspected, as there is relative enlargement
of the LEFT lobe compared to the RIGHT lobe.

Aortic Atherosclerosis ([id number].J).
